# Patient Record
Sex: Female | Born: 1935 | Race: Black or African American | Hispanic: No | Smoking: Former smoker
Health system: Southern US, Community
[De-identification: ages and names within clinical notes are randomized; demographics above are authoritative.]

## PROBLEM LIST (undated history)

## (undated) DIAGNOSIS — M1990 Unspecified osteoarthritis, unspecified site: Secondary | ICD-10-CM

## (undated) DIAGNOSIS — Z8673 Personal history of transient ischemic attack (TIA), and cerebral infarction without residual deficits: Secondary | ICD-10-CM

## (undated) DIAGNOSIS — R413 Other amnesia: Secondary | ICD-10-CM

## (undated) DIAGNOSIS — I499 Cardiac arrhythmia, unspecified: Secondary | ICD-10-CM

## (undated) DIAGNOSIS — R9439 Abnormal result of other cardiovascular function study: Secondary | ICD-10-CM

## (undated) DIAGNOSIS — E039 Hypothyroidism, unspecified: Secondary | ICD-10-CM

## (undated) DIAGNOSIS — I739 Peripheral vascular disease, unspecified: Secondary | ICD-10-CM

## (undated) DIAGNOSIS — I2510 Atherosclerotic heart disease of native coronary artery without angina pectoris: Secondary | ICD-10-CM

## (undated) DIAGNOSIS — N184 Chronic kidney disease, stage 4 (severe): Secondary | ICD-10-CM

## (undated) DIAGNOSIS — IMO0002 Reserved for concepts with insufficient information to code with codable children: Secondary | ICD-10-CM

## (undated) DIAGNOSIS — Z9289 Personal history of other medical treatment: Secondary | ICD-10-CM

## (undated) DIAGNOSIS — I10 Essential (primary) hypertension: Secondary | ICD-10-CM

## (undated) DIAGNOSIS — B029 Zoster without complications: Secondary | ICD-10-CM

## (undated) DIAGNOSIS — G459 Transient cerebral ischemic attack, unspecified: Secondary | ICD-10-CM

## (undated) DIAGNOSIS — I428 Other cardiomyopathies: Secondary | ICD-10-CM

## (undated) DIAGNOSIS — E785 Hyperlipidemia, unspecified: Secondary | ICD-10-CM

## (undated) DIAGNOSIS — I5032 Chronic diastolic (congestive) heart failure: Secondary | ICD-10-CM

## (undated) DIAGNOSIS — I639 Cerebral infarction, unspecified: Secondary | ICD-10-CM

## (undated) DIAGNOSIS — G629 Polyneuropathy, unspecified: Secondary | ICD-10-CM

## (undated) DIAGNOSIS — I219 Acute myocardial infarction, unspecified: Secondary | ICD-10-CM

## (undated) DIAGNOSIS — K219 Gastro-esophageal reflux disease without esophagitis: Secondary | ICD-10-CM

## (undated) DIAGNOSIS — D649 Anemia, unspecified: Secondary | ICD-10-CM

## (undated) HISTORY — DX: Hyperlipidemia, unspecified: E78.5

## (undated) HISTORY — PX: EYE SURGERY: SHX253

## (undated) HISTORY — DX: Other cardiomyopathies: I42.8

## (undated) HISTORY — DX: Abnormal result of other cardiovascular function study: R94.39

## (undated) HISTORY — DX: Polyneuropathy, unspecified: G62.9

## (undated) HISTORY — PX: BACK SURGERY: SHX140

## (undated) HISTORY — PX: AV FISTULA PLACEMENT: SHX1204

## (undated) HISTORY — DX: Personal history of other medical treatment: Z92.89

---

## 1986-03-21 HISTORY — PX: ANGIOPLASTY: SHX39

## 1999-04-19 ENCOUNTER — Encounter: Admission: RE | Admit: 1999-04-19 | Discharge: 1999-05-31 | Payer: Self-pay | Admitting: Orthopedic Surgery

## 1999-10-11 ENCOUNTER — Ambulatory Visit (HOSPITAL_COMMUNITY): Admission: RE | Admit: 1999-10-11 | Discharge: 1999-10-11 | Payer: Self-pay | Admitting: *Deleted

## 2000-06-29 ENCOUNTER — Encounter: Admission: RE | Admit: 2000-06-29 | Discharge: 2000-06-29 | Payer: Self-pay | Admitting: *Deleted

## 2000-11-08 ENCOUNTER — Encounter (INDEPENDENT_AMBULATORY_CARE_PROVIDER_SITE_OTHER): Payer: Self-pay | Admitting: *Deleted

## 2000-11-08 ENCOUNTER — Ambulatory Visit (HOSPITAL_COMMUNITY): Admission: RE | Admit: 2000-11-08 | Discharge: 2000-11-08 | Payer: Self-pay | Admitting: *Deleted

## 2001-12-12 ENCOUNTER — Encounter: Admission: RE | Admit: 2001-12-12 | Discharge: 2001-12-12 | Payer: Self-pay | Admitting: Orthopedic Surgery

## 2001-12-12 ENCOUNTER — Encounter: Payer: Self-pay | Admitting: Orthopedic Surgery

## 2002-02-19 ENCOUNTER — Encounter: Payer: Self-pay | Admitting: Orthopedic Surgery

## 2002-02-22 ENCOUNTER — Inpatient Hospital Stay (HOSPITAL_COMMUNITY): Admission: RE | Admit: 2002-02-22 | Discharge: 2002-03-01 | Payer: Self-pay | Admitting: Orthopedic Surgery

## 2002-02-22 ENCOUNTER — Encounter: Payer: Self-pay | Admitting: Orthopedic Surgery

## 2002-07-16 ENCOUNTER — Other Ambulatory Visit: Admission: RE | Admit: 2002-07-16 | Discharge: 2002-07-16 | Payer: Self-pay | Admitting: Obstetrics and Gynecology

## 2002-07-24 ENCOUNTER — Ambulatory Visit (HOSPITAL_COMMUNITY): Admission: RE | Admit: 2002-07-24 | Discharge: 2002-07-24 | Payer: Self-pay | Admitting: Obstetrics and Gynecology

## 2002-07-24 ENCOUNTER — Encounter: Payer: Self-pay | Admitting: Obstetrics and Gynecology

## 2003-03-13 ENCOUNTER — Encounter: Admission: RE | Admit: 2003-03-13 | Discharge: 2003-03-13 | Payer: Self-pay | Admitting: Internal Medicine

## 2003-04-18 ENCOUNTER — Observation Stay (HOSPITAL_COMMUNITY): Admission: EM | Admit: 2003-04-18 | Discharge: 2003-04-19 | Payer: Self-pay | Admitting: Emergency Medicine

## 2004-07-05 ENCOUNTER — Observation Stay (HOSPITAL_COMMUNITY): Admission: EM | Admit: 2004-07-05 | Discharge: 2004-07-07 | Payer: Self-pay | Admitting: Emergency Medicine

## 2004-07-06 ENCOUNTER — Encounter (INDEPENDENT_AMBULATORY_CARE_PROVIDER_SITE_OTHER): Payer: Self-pay | Admitting: Cardiology

## 2005-03-08 ENCOUNTER — Other Ambulatory Visit: Admission: RE | Admit: 2005-03-08 | Discharge: 2005-03-08 | Payer: Self-pay | Admitting: Obstetrics and Gynecology

## 2005-04-01 ENCOUNTER — Ambulatory Visit (HOSPITAL_COMMUNITY): Admission: RE | Admit: 2005-04-01 | Discharge: 2005-04-01 | Payer: Self-pay | Admitting: Obstetrics and Gynecology

## 2006-01-13 ENCOUNTER — Emergency Department (HOSPITAL_COMMUNITY): Admission: EM | Admit: 2006-01-13 | Discharge: 2006-01-14 | Payer: Self-pay | Admitting: Emergency Medicine

## 2006-06-10 ENCOUNTER — Emergency Department (HOSPITAL_COMMUNITY): Admission: EM | Admit: 2006-06-10 | Discharge: 2006-06-10 | Payer: Self-pay | Admitting: Family Medicine

## 2006-12-10 ENCOUNTER — Emergency Department (HOSPITAL_COMMUNITY): Admission: EM | Admit: 2006-12-10 | Discharge: 2006-12-10 | Payer: Self-pay | Admitting: *Deleted

## 2006-12-24 ENCOUNTER — Encounter: Admission: RE | Admit: 2006-12-24 | Discharge: 2006-12-24 | Payer: Self-pay | Admitting: Internal Medicine

## 2007-09-23 ENCOUNTER — Inpatient Hospital Stay (HOSPITAL_COMMUNITY): Admission: EM | Admit: 2007-09-23 | Discharge: 2007-10-04 | Payer: Self-pay | Admitting: Emergency Medicine

## 2007-09-24 ENCOUNTER — Encounter (INDEPENDENT_AMBULATORY_CARE_PROVIDER_SITE_OTHER): Payer: Self-pay | Admitting: Internal Medicine

## 2007-09-28 HISTORY — PX: CARDIAC CATHETERIZATION: SHX172

## 2007-11-17 ENCOUNTER — Emergency Department (HOSPITAL_COMMUNITY): Admission: EM | Admit: 2007-11-17 | Discharge: 2007-11-17 | Payer: Self-pay | Admitting: Emergency Medicine

## 2008-02-11 ENCOUNTER — Emergency Department (HOSPITAL_COMMUNITY): Admission: EM | Admit: 2008-02-11 | Discharge: 2008-02-11 | Payer: Self-pay | Admitting: Emergency Medicine

## 2008-09-10 ENCOUNTER — Emergency Department (HOSPITAL_COMMUNITY): Admission: EM | Admit: 2008-09-10 | Discharge: 2008-09-11 | Payer: Self-pay | Admitting: Emergency Medicine

## 2008-09-25 ENCOUNTER — Emergency Department (HOSPITAL_COMMUNITY): Admission: EM | Admit: 2008-09-25 | Discharge: 2008-09-25 | Payer: Self-pay | Admitting: Emergency Medicine

## 2008-10-13 ENCOUNTER — Emergency Department (HOSPITAL_COMMUNITY): Admission: EM | Admit: 2008-10-13 | Discharge: 2008-10-14 | Payer: Self-pay | Admitting: Emergency Medicine

## 2009-01-22 ENCOUNTER — Emergency Department (HOSPITAL_COMMUNITY): Admission: EM | Admit: 2009-01-22 | Discharge: 2009-01-22 | Payer: Self-pay | Admitting: Emergency Medicine

## 2009-02-02 ENCOUNTER — Emergency Department (HOSPITAL_COMMUNITY): Admission: EM | Admit: 2009-02-02 | Discharge: 2009-02-02 | Payer: Self-pay | Admitting: Emergency Medicine

## 2009-04-29 ENCOUNTER — Emergency Department (HOSPITAL_COMMUNITY): Admission: EM | Admit: 2009-04-29 | Discharge: 2009-04-29 | Payer: Self-pay | Admitting: Emergency Medicine

## 2009-06-01 DIAGNOSIS — R9439 Abnormal result of other cardiovascular function study: Secondary | ICD-10-CM

## 2009-06-01 HISTORY — DX: Abnormal result of other cardiovascular function study: R94.39

## 2009-08-27 ENCOUNTER — Ambulatory Visit (HOSPITAL_COMMUNITY): Admission: RE | Admit: 2009-08-27 | Discharge: 2009-08-27 | Payer: Self-pay | Admitting: Orthopaedic Surgery

## 2009-12-31 ENCOUNTER — Emergency Department (HOSPITAL_COMMUNITY): Admission: EM | Admit: 2009-12-31 | Discharge: 2010-01-01 | Payer: Self-pay | Admitting: Emergency Medicine

## 2010-06-02 LAB — URINALYSIS, ROUTINE W REFLEX MICROSCOPIC
Hgb urine dipstick: NEGATIVE
Ketones, ur: NEGATIVE mg/dL
Protein, ur: 30 mg/dL — AB
Specific Gravity, Urine: 1.017 (ref 1.005–1.030)

## 2010-06-02 LAB — URINE MICROSCOPIC-ADD ON

## 2010-06-07 LAB — GLUCOSE, CAPILLARY
Glucose-Capillary: 123 mg/dL — ABNORMAL HIGH (ref 70–99)
Glucose-Capillary: 95 mg/dL (ref 70–99)

## 2010-06-07 LAB — BASIC METABOLIC PANEL
BUN: 22 mg/dL (ref 6–23)
GFR calc Af Amer: 34 mL/min — ABNORMAL LOW (ref >60)
GFR calc non Af Amer: 28 mL/min — ABNORMAL LOW (ref >60)
Glucose, Bld: 89 mg/dL (ref 70–99)
Potassium: 5.8 mEq/L — ABNORMAL HIGH (ref 3.5–5.1)
Sodium: 138 mEq/L (ref 135–145)

## 2010-06-07 LAB — POCT I-STAT 4, (NA,K, GLUC, HGB,HCT)
Glucose, Bld: 100 mg/dL — ABNORMAL HIGH (ref 70–99)
HCT: 39 % (ref 36.0–46.0)
Hemoglobin: 12.6 g/dL (ref 12.0–15.0)
Potassium: 5.7 mEq/L — ABNORMAL HIGH (ref 3.5–5.1)
Potassium: 6.3 mEq/L (ref 3.5–5.1)
Sodium: 140 mEq/L (ref 135–145)

## 2010-06-07 LAB — CBC
HCT: 37.2 % (ref 36.0–46.0)
Hemoglobin: 12.2 g/dL (ref 12.0–15.0)
MCHC: 32.9 g/dL (ref 30.0–36.0)
MCV: 96.2 fL (ref 78.0–100.0)
Platelets: 359 10*3/uL (ref 150–400)
WBC: 7.4 10*3/uL (ref 4.0–10.5)

## 2010-06-09 LAB — DIFFERENTIAL
Basophils Absolute: 0 10*3/uL (ref 0.0–0.1)
Basophils Relative: 1 % (ref 0–1)
Eosinophils Absolute: 0.2 10*3/uL (ref 0.0–0.7)
Eosinophils Relative: 5 % (ref 0–5)
Monocytes Absolute: 0.5 10*3/uL (ref 0.1–1.0)

## 2010-06-09 LAB — CBC
MCHC: 33.5 g/dL (ref 30.0–36.0)
Platelets: 256 10*3/uL (ref 150–400)
RDW: 14.5 % (ref 11.5–15.5)

## 2010-06-09 LAB — CK TOTAL AND CKMB (NOT AT ARMC)
Relative Index: INVALID (ref 0.0–2.5)
Total CK: 72 U/L (ref 7–177)

## 2010-06-09 LAB — BASIC METABOLIC PANEL
CO2: 23 mEq/L (ref 19–32)
Calcium: 8.5 mg/dL (ref 8.4–10.5)
Creatinine, Ser: 1.45 mg/dL — ABNORMAL HIGH (ref 0.4–1.2)
Glucose, Bld: 97 mg/dL (ref 70–99)

## 2010-06-27 LAB — CBC
Hemoglobin: 11.8 g/dL — ABNORMAL LOW (ref 12.0–15.0)
MCHC: 33 g/dL (ref 30.0–36.0)
RDW: 17.1 % — ABNORMAL HIGH (ref 11.5–15.5)

## 2010-06-27 LAB — COMPREHENSIVE METABOLIC PANEL
ALT: 30 U/L (ref 0–35)
Calcium: 9.6 mg/dL (ref 8.4–10.5)
GFR calc Af Amer: 30 mL/min — ABNORMAL LOW (ref >60)
Glucose, Bld: 148 mg/dL — ABNORMAL HIGH (ref 70–99)
Sodium: 137 mEq/L (ref 135–145)
Total Protein: 6.4 g/dL (ref 6.0–8.3)

## 2010-06-27 LAB — DIFFERENTIAL
Eosinophils Absolute: 0 10*3/uL (ref 0.0–0.7)
Lymphs Abs: 0.9 10*3/uL (ref 0.7–4.0)
Monocytes Relative: 7 % (ref 3–12)
Neutro Abs: 5.1 10*3/uL (ref 1.7–7.7)
Neutrophils Relative %: 78 % — ABNORMAL HIGH (ref 43–77)

## 2010-06-28 LAB — COMPREHENSIVE METABOLIC PANEL
BUN: 12 mg/dL (ref 6–23)
CO2: 23 mEq/L (ref 19–32)
Chloride: 103 mEq/L (ref 96–112)
Creatinine, Ser: 1.72 mg/dL — ABNORMAL HIGH (ref 0.4–1.2)
GFR calc non Af Amer: 28 mL/min — ABNORMAL LOW (ref >60)
Glucose, Bld: 154 mg/dL — ABNORMAL HIGH (ref 70–99)
Total Bilirubin: 0.6 mg/dL (ref 0.3–1.2)

## 2010-06-28 LAB — CBC
HCT: 35.1 % — ABNORMAL LOW (ref 36.0–46.0)
Hemoglobin: 11.7 g/dL — ABNORMAL LOW (ref 12.0–15.0)
MCV: 101.7 fL — ABNORMAL HIGH (ref 78.0–100.0)
RBC: 3.46 MIL/uL — ABNORMAL LOW (ref 3.87–5.11)
WBC: 7.4 10*3/uL (ref 4.0–10.5)

## 2010-06-28 LAB — DIFFERENTIAL
Basophils Absolute: 0 10*3/uL (ref 0.0–0.1)
Basophils Relative: 0 % (ref 0–1)
Lymphocytes Relative: 25 % (ref 12–46)
Neutro Abs: 4.9 10*3/uL (ref 1.7–7.7)

## 2010-06-28 LAB — URINALYSIS, ROUTINE W REFLEX MICROSCOPIC
Bilirubin Urine: NEGATIVE
Glucose, UA: NEGATIVE mg/dL
Hgb urine dipstick: NEGATIVE
Protein, ur: NEGATIVE mg/dL
Specific Gravity, Urine: 1.02 (ref 1.005–1.030)
Urobilinogen, UA: 0.2 mg/dL (ref 0.0–1.0)

## 2010-06-28 LAB — POCT CARDIAC MARKERS
CKMB, poc: 3.4 ng/mL (ref 1.0–8.0)
Myoglobin, poc: 55.2 ng/mL (ref 12–200)

## 2010-06-28 LAB — LIPASE, BLOOD: Lipase: 25 U/L (ref 11–59)

## 2010-08-03 NOTE — Consult Note (Signed)
[name], [name]               ACCOUNT NO.:  [id number]   MEDICAL RECORD NO.:  [id number]          PATIENT TYPE:  INP   LOCATION:  4713                         FACILITY:  [facility]   PHYSICIAN:  Dyke Maes, M.D.DATE OF BIRTH:  April 07, 1923   DATE OF CONSULTATION:  DATE OF DISCHARGE:                                 CONSULTATION   REFERRING PHYSICIAN:  Herbie Saxon, MD   REASON FOR CONSULTATION:  Chronic kidney disease.   HISTORY OF PRESENT ILLNESS:  This is an 77 year old white female  admitted on September 23, 2007, with a several-week history of weakness, poor  appetite, dyspnea on exertion, and chest pains.  She denies any history  of chronic kidney disease.  Her serum creatinine in 2006 was 1.5 and on  September 18, 2007, it was 1.68.  On admission, serum creatinine was 1.7 and  remained that way up until September 28, 2007, when she underwent heart  catheterization.  Serum creatinine is only slightly increased since then  to 1.8 on September 29, 2007, and 1.9 on September 30, 2007, and stable at 1.9  today.  She had a renal ultrasound done in 2004, which revealed a small  left kidney and renal ultrasound done earlier today confirms a small  left kidney with a right kidney of 9.6 cm that is echogenic.  She has  had hypertension for as long as I can remember but at least 40 years.  She is found to be diabetic this hospitalization.  According to her, she  has never been told that she has been diabetic.  She is to undergo a  heart cath on September 28, 2007, with 45 milliseconds contrast, which  revealed only mild coronary artery disease, not requiring any other  procedures.  She denies any history of gross hematuria, kidney stones,  use of nonsteroidal medications, or family history of renal disease.   PAST MEDICAL HISTORY:  Significant for:  1. CVA.  2. Gastroesophageal reflux disease.  3. Hypertension.  4. Hypothyroidism.  5. History of an MI.  She is status post a cath in 1980.  6.  History of gout.  7. Chronic low back pain, for which she says she was placed on small      dose of prednisone.   ALLERGIES:  No true allergies, though ASPIRIN causes upset stomach.   MEDICATIONS:  1. Lovenox 30 mg daily.  2. Prednisone 5 mg b.i.d.  3. Calcium carbonate 1 a day.  4. Crestor 20 mg a day.  5. Quinine 325 mg nightly.  6. Aggrenox 1 b.i.d.  7. Protonix 40 mg a day.  8. Lopressor 25 mg a day.  9. Norvasc 10 mg daily.  10.Levothyroxine 25 mcg a day.  11.Lantus 4 units nightly.  12.Rocephin 1 g daily.   SOCIAL HISTORY:  Ex-smoker, quit 30 years ago.  She denies alcohol.  She  lives in [place] with her son.   FAMILY HISTORY:  Negative for renal disease.  She has a brother and a  sister with heart disease.   REVIEW OF SYSTEMS:  Appetite has improved somewhat.  She  denies any  shortness of breath at present.  No chest pains or chest pressures.  No  recent change in bowel habits.  She does have chronic low back pain.  No  new arthritic complaints.  No new skin rashes.  No dysuria.  The rest of  review of systems are unremarkable.   PHYSICAL EXAMINATION:  VITAL SIGNS:  Blood pressure 113/53, pulse 79,  and temperature 97.9.  GENERAL:  An 77 year old black female in no acute distress.  HEENT:  Sclerae nonicteric.  Extraocular muscles are intact.  NECK:  No JVD.  No lymphadenopathy.  LUNGS:  Decreased breath sounds in bases with a few bibasilar crackles.  HEART:  Regular rate and rhythm without murmur, rub, or gallop.  ABDOMEN:  Positive bowel sounds.  Nontender and nondistended.  No  hepatosplenomegaly.  No bruits.  EXTREMITIES:  No edema.  No evidence for cholesterol emboli.  Pulses are  2/4 in the carotids, radialis, and femoralis and 0/4 in posterior tibial  and dorsalis pedis.  NEUROLOGIC:  Cranial nerves are intact.  Motor and sensory intact.  Oriented x3.  No asterixis.   LABORATORY DATA:  Sodium 133, potassium 4.6, bicarb 18, BUN 33, and  creatinine 1.9.   Hemoglobin 8.4, white count 8.8, and platelet count  333,000.  Urinalysis reveals a benign microscopic exam 30 mg/dL protein.   IMPRESSION:  1. Chronic kidney disease stage III secondary to solitary functioning      right kidney and hypertension.  2. Hypertension, currently well controlled.  3. Coronary artery disease, medical management.  4. Anemia.  Of note, her hemoglobin was 10.8 on August 29, 2007, and on      admission was 10.5, was decreased to 8.8.   PLAN:  1. We will check an SPEP.  2. We will check a serum protein electrophoresis.  3. We will check iron, TIBC, and ferritin  4. We will intact PTH.  5. We will start p.o. Lasix 80 mg b.i.d.   Thank you for this consult.  We will follow the patient with you.           ______________________________  Dyke Maes, M.D.     MTM/MEDQ  D:  10/01/2007  T:  10/02/2007  Job:  [id number]

## 2010-08-03 NOTE — Discharge Summary (Signed)
[name], [name]               ACCOUNT NO.:  [id number]   MEDICAL RECORD NO.:  [id number]          PATIENT TYPE:  INP   LOCATION:  4713                         FACILITY:  [facility]   PHYSICIAN:  Beckey Rutter, MD  DATE OF BIRTH:  October 01, 1923   DATE OF ADMISSION:  09/23/2007  DATE OF DISCHARGE:                               DISCHARGE SUMMARY   CHIEF COMPLAINT ON PRESENTATION:  Generalized weakness, anorexia for 3  weeks and poor p.o. intake.  Also, chest discomfort.   HOSPITAL COURSE:  1. The patient has multiple reasons for her poor p.o. intake, but the      main concern was her chest pain, which was associated with      diaphoresis.  The patient was seen by cardiology for stratification      and her cardiac cath was not significant.  Please see the detailed      result of cardiac catheterization on the E-chart or the summary      below.  2. Chronic kidney disease.  As per her daughter, the patient never      diagnosed with chronic kidney disease, although her creatinine was      1.9 on admission and remained around that number.  She received      Mucomyst prior to the coronary angiogram contrast.  The nephrology      was called for evaluation of chronic kidney disease stage 3 and it      was felt the chronic kidney disease likely secondary to solitary      functioning right kidney and hypertension.  The rest of the kidney      workup is pending.   The patient was started on p.o. Lasix 80 mg b.i.d. but today she had  severe orthostatic symptoms and I would go ahead and discontinue the  Lasix and will discontinue Norvasc as well.   DISCHARGE DIAGNOSES:  1. Generalized weakness with multiple morbidity and now with chronic      kidney disease stage 3.  2. Chest pain.  The patient ruled out for acute coronary syndrome and      now she is status post coronary angiogram.  3. History of cerebrovascular accident.  4. Hypertension.  5. Hypothyroidism.  6. History of gout.   HOSPITAL CONSULTATION:  1. [facility].  2. Nephrology consultation done by Dr. Primitivo Gauze.   DISCHARGE MEDICATIONS:  Discharge medication will be dictated on the day  of actual discharge.   PLAN:  The plan to stabilize her blood pressure and finalize the kidney  workup prior to discharge.      Beckey Rutter, MD  Electronically Signed    EME/MEDQ  D:  10/02/2007  T:  10/03/2007  Job:  [id number]

## 2010-08-03 NOTE — H&P (Signed)
NAMEHEER, JUSTISS               ACCOUNT NO.:  [id number]   MEDICAL RECORD NO.:  [id number]          PATIENT TYPE:  EMS   LOCATION:  MAJO                         FACILITY:  [facility]   PHYSICIAN:  Herbie Saxon, MDDATE OF BIRTH:  Jun 08, 1923   DATE OF ADMISSION:  09/23/2007  DATE OF DISCHARGE:                              HISTORY & PHYSICAL   PRESENTING COMPLAINT:  Generalized weakness, anorexia 3 weeks, poor oral  intake x3 weeks, difficult to ambulate, chest discomfort, and shortness  of breath on exertion, one day.   HISTORY OF PRESENTING COMPLAINT:  This 77 year old African-American lady  presented to the emergency room with generalized chest discomfort,  dyspnea on exertion, increased weakness, making her unable to actually  ambulate.  The patient has been sick for the last 3 weeks with  intermittent nausea, vomiting, anorexia, and malaise.  She denies any  fever, skin rash, or lymph node swelling.  The patient has not been  coughing.  She denies any dysuria, hematuria, urgency, or frequency of  urination.  She does not have any ankle swollen.  No facial puffiness.  There was no documented weakness, no syncopal episode, loss of  consciousness, or seizure activity.  There has been no joint swelling.   PAST MEDICAL HISTORY:  CVA, gastroesophageal reflux disease, gout,  hyperlipidemia, hypertension, hypothyroidism, myocardial infarction; she  is status post PTCA.   SURGERIES:  Cardiac catheterization, PTCA, performed by Dr. Shirlee Latch and  back surgery.   SOCIAL HISTORY:  She lives with the family and quit smoking more than 30  years ago.  No history of illicit drug abuse or alcohol abuse.   FAMILY HISTORY:  She has a brother with coronary artery disease; also  has a sister with heart disease.   ALLERGIES:  ASPIRIN.   MEDICATIONS:  1. Aggrenox 1 tablet twice daily.  2. Caduet 5/40 one daily.  3. Calcium 600 mg daily.  4. Fosamax Plus D 70 mg daily.  5. Levothyroxine 1  tablet daily.  6. Qualaquin 324 mg daily.  7. VESIcare p.r.n.  8. Labetalol twice daily.  9. Zantac 75 mg daily.  10.Prednisone 1 tablet twice daily.   REVIEW OF SYSTEMS:  14 systems are reviewed, pertinent positives as in  the history of presenting complaint.   PHYSICAL EXAMINATION:  GENERAL:  On examination, she is an elderly lady,  alert and oriented to time, place, and person.  VITAL SIGNS:  Temperature is 100.8, pulse is 105, respiratory rate is  28, and blood pressure 88/67.  HEENT:  She is pale, not jaundiced.  Head is atraumatic and  normocephalic.  Mucous membranes are dry.  NECK:  Supple.  There is no elevated JVD or thyromegaly.  No carotid  bruits.  HEART:  Sounds are 1 and 2, regular rate and rhythm.  ABDOMEN:  Soft and nontender.  No organomegaly.  CHEST:  Clinically clear.  No rales or rhonchi.  No gallops, rubs, or  murmurs.  NEUROLOGIC:  Cranial nerves II through XII are intact.  There is no  pronator drift.  Power is 4+ in all limbs, but she is  unable to ambulate  because of extreme weakness.  Peripheral pulses present.  No pedal  edema.  There is no erythema.  No cyanosis.  No clubbing.   LABORATORY DATA:  WBC is 11.1, hematocrit 32, and platelet count is 268.  Chemistry, sodium is 132, potassium 4.3, chloride 103, BUN 20,  creatinine 1.9, glucose is 169, troponin is less than 0.05, INR is 1.0,  and PTT 41.  Chest x-ray, no active disease.  CT head, no acute changes.  Other labs pending.  EKG pending.   ASSESSMENT:  Dehydration; failure to thrive; hyponatremia; diabetes,  uncontrolled; leukocytosis, rule out underlying focal infection with  incipient septic shock; anemia of chronic disease; chest pain rule out  acute coronary syndrome.  The patient will be admitted to telemetry bed.  Was started on IV fluid, D5 half normal saline at 40 mL an hour,  watching for fluid overload.  We will get a serial cardiac enzymes and  EKG, obtain a cortisol level, D-dimer,  and 2D echocardiogram.  We will  get a thyroid function, fasting lipid panel, hemoglobin A1c.  The  patient will be on Accu-cheks a.c. and h.s. with NovoLog sliding scale  coverage.  We will start her on Rocephin 1 g IV daily.  We will continue  with calcium supplementation.  We will hold her labetalol and Caduet  will be resumed but we will hold if blood pressure is less than 100/60.  PT/OT input will be sought.  She is to be on Lovenox 30 mg subcu daily  for DVT prophylaxis and Protonix 40 mg IV daily, DuoNeb 1 unit dose  q.6h. p.r.n.  We will repeat her labs CBC, CMP in the morning and obtain  the urine sodium level.  Her illness, medication, test, and treatment  plan explained to her family members, they verbalized understanding.      Herbie Saxon, MD  Electronically Signed     MIO/MEDQ  D:  09/23/2007  T:  09/24/2007  Job:  [id number]

## 2010-08-03 NOTE — Cardiovascular Report (Signed)
[name], [name]               ACCOUNT NO.:  [id number]   MEDICAL RECORD NO.:  [id number]          PATIENT TYPE:  INP   LOCATION:  4713                         FACILITY:  [facility]   PHYSICIAN:  Antonieta Iba, MD   DATE OF BIRTH:  30-Nov-1923   DATE OF PROCEDURE:  DATE OF DISCHARGE:                            CARDIAC CATHETERIZATION   PROCEDURE:  Cardiac catheterization.   PHYSICIAN PERFORMING PROCEDURE:  Antonieta Iba, MD   PROCEDURE DETAIL:  [name] was brought to the cardiac catheterization  and prepped and draped in usual sterile fashion.  1% lidocaine was used  for local anesthesia above the right femoral artery and the modified  Seldinger technique was used to advance a 6-French catheter into the  right femoral artery.  A 6-French Judkins  #4 left and a #4 right  catheter was used to engage the coronary arteries, left and right  coronary arteries respectively.  Selective angiography by hand injection  was performed with multiple projections of different views of the  coronary anatomy.  No LV gram was performed due to the patient's  elevated creatinine.  A total of 45 mL of contrast was used for the  catheterization.  There were no complications noted.  The  catheterization was removed following the procedure and hemostasis was  obtained with pressure.   PROCEDURE DETAIL:  1. Left main:  The left main is a moderate-sized vessel that      bifurcates into the LAD and left circumflex.  There is no      significant disease noted.  2. Left anterior descending:  The left anterior descending is a      moderate-to-large sized vessel that extents and wraps around the      apex. There are 3 diagonal vessels of moderate size.  It is      difficult to visualize the ostial D1 that appears to be some      moderate stenosis with an estimated lesion of 50-60%.  There is      some mild mid LAD disease between the first diagonal and second      diagonal.  Otherwise, there is no other  significant disease noted.  3. Left circumflex:  The left circumflex is a moderate-sized vessel      with 3 obtuse marginal branches.  The OM1 and OM2 are moderate-      sized vessels.  There is some mild proximal left circumflex disease      before the OM1.  Otherwise, no significant disease noted.  4. Right coronary artery:  The RCA is a dominant vessel that extents      to a PDA and PL branch.  It is large branch with mild diffuse      regions of disease estimated from 20-40%.  These lesions extent      proximally to mid-to-distally and into the PDA branch.  No      significant stenosis is seen.   FINAL IMPRESSION:  Right dominant coronary system with mild-to-moderate  coronary disease in the left anterior descending, circumflex, and right  coronary artery.  There  is no significant stenosis that warrants  intervention at this time.  The patient's creatinine is mildly elevated  and she will be given fluids following this procedure and her creatinine  will be monitored.  For her coronary disease, medical management is  strongly recommended.      Antonieta Iba, MD  Electronically Signed    TJG/MEDQ  D:  09/28/2007  T:  09/29/2007  Job:  [id number]

## 2010-08-03 NOTE — Consult Note (Signed)
[name], [name]               ACCOUNT NO.:  [id number]   MEDICAL RECORD NO.:  [id number]          PATIENT TYPE:  EMS   LOCATION:  MAJO                         FACILITY:  [facility]   PHYSICIAN:  Vinnie Level, MD    DATE OF BIRTH:  06/12/1923   DATE OF CONSULTATION:  09/11/2008  DATE OF DISCHARGE:  09/11/2008                                 CONSULTATION   TIME:  4 a.m.   REFERRING PHYSICIAN:  [facility].   CHIEF COMPLAINT:  Abdominal pain.   HISTORY OF PRESENT ILLNESS:  An 77 year old African American female with  history of hypertension, diabetes, gout, AFib, and remote stroke,  presents with 2 days of diffuse abdominal pain and anorexia.  She noted  the pain came on gradually and has had decreased p.o. intake over the  interim.  She complains of one episode of emesis the night before last  and one episode tonight with administration of oral CT contrast.  She  noted the pain is mild now and generally diffuse.  She has been able to  take her meds at home.   PAST MEDICAL HISTORY:  Notable for:  1. Atrial fibrillation.  2. CVA in 1988.  3. GERD.  4. Hypertension.  5. Diabetes.  6. Gout.  7. Hyperlipidemia.  8. Hypothyroidism.  9. Coronary artery disease.  10.Osteoarthritis.  11.CKD with baseline creatinine about 1.7.  12.Polymyalgia rheumatica.  13.History of remote back surgery.   MEDICATIONS AT HOME:  1. Prednisone 5 mg p.o. daily.  2. Aggrenox 25/200 p.o. daily.  3. Levothyroxine 100 mg p.o. daily.  4. Fosamax 70 mg every week.  5. Metformin 500 mg p.o. daily.  6. Caduet 5/40 p.o. daily.  7. Labetalol 100 mg p.o. daily.  8. Quinalan 324 mg daily.  9. Tramadol p.r.n.  10.Flonase p.r.n.  11.Combivent p.r.n.  12.Vicodin p.r.n.   ALLERGIES:  ASPIRIN, which she reports causes emesis.  However, she is  on Aggrenox.   FAMILY HISTORY:  Noncontributory.  No history of early coronary artery  disease.   REVIEW OF SYSTEMS:  Full 14-point review of systems was  obtained,  negative except as noted above in the HPI.   OBJECTIVE:  VITAL SIGNS:  Temperature is 98.1, blood pressure 147/79,  pulse 73-87, respiratory rate 18, sats 93% to 100% on room air.  GENERAL:  In no acute distress.  Alert and oriented x4 to time, place,  date, and President.  HEENT:  Normocephalic and atraumatic.  Pupils are equal, round, reactive  to light and accommodation.  Hard of hearing.  NECK:  No JVD.  No bruits.  CARDIOVASCULAR:  A 2/6 systolic murmur at the upper sternal border  without radiation to the neck.  S1 and S2 are normal.  CHEST:  Clear to auscultation bilaterally.  ABDOMEN:  Normal bowel sounds.  Positive gas.  She does have some mild  diffuse tenderness to palpation but no focal areas of tenderness.  No  rebound.  No guarding.  EXTREMITIES:  No clubbing, cyanosis, or edema.  Decreased bulk  peripherally.  NEUROLOGIC:  Nonfocal.  No residual weakness.  Cranial nerves II through  XII are grossly intact.  SKIN:  Without rash.  PSYCH:  Mood and affect are fair.   OBJECTIVE DATA:  CBC with white count of 7.4, hemoglobin 11.7, platelet  count 301.  She has normal urinalysis.  Her lipase was 25.  CMP revealed  sodium of 134, potassium of 4.2, chloride 103, bicarb 23, BUN 12,  creatinine at baseline 1.72, glucose 154, calcium 7.1, albumin 3.8.  Her  alk phos was normal at 50, and AST and ALT were completely normal.  CT  of the abdomen without IV contrast due to her CKD did note some duodenal  diverticula and left renal atrophy.  No evidence of ductal dilatation.   ASSESSMENT AND PLAN:  An 77 year old African-American female with  hypertension, diabetes, atrial fibrillation, hyperlipidemia, and  polymyalgia rheumatica who presents with diffuse nonfocal abdominal pain  without localizing signs by exam and history of recent normal labs.  She  does have a normal CT scan as well.  Her differential includes  diverticulosis versus diverticulitis though she has no  history of  bleeding, gastritis with possible offensive medication including  metformin and recently increased dose of her prednisone (Thursday of the  week prior).  There is no evidence of biliary obstruction, and her  pancreatic enzymes are within normal limits.  1. She is going to try an oral challenge here in the emergency room.      If she is able to hold down water and starts to feel better, I      think she is appropriate for discharge.  She will need short-term      followup with her primary care physician within the next several      days.  She is encouraged to continue oral hydration and encouraged      to avoid significant solid foods and slowly advance her diet.  Her      daughters understood this.  I would recommend cessation of      metformin for the time being and discussed with her primary care      physician plans for her prednisone.  I strongly encouraged them to      plan to taper this to off as soon as possible and possibly to      follow      up with a rheumatologist for other options other than chronic      prednisone administration.  2. Her gallbladder appeared normal by CT scan; however, an abdominal      ultrasound would not be unreasonable in the outpatient setting.      Vinnie Level, MD  Electronically Signed     PMB/MEDQ  D:  09/11/2008  T:  09/11/2008  Job:  [id number]

## 2010-08-06 NOTE — Discharge Summary (Signed)
[name], [name]               ACCOUNT NO.:  [id number]   MEDICAL RECORD NO.:  [id number]          PATIENT TYPE:  INP   LOCATION:  4713                         FACILITY:  [facility]   PHYSICIAN:  Michelene Gardener, MD    DATE OF BIRTH:  04/13/1923   DATE OF ADMISSION:  09/23/2007  DATE OF DISCHARGE:  10/04/2007                               DISCHARGE SUMMARY   ADDENDUM   DISCHARGE DIAGNOSES:  1. Generalized weakness with multiple morbidity, the patient is back      to her baseline.  2. Orthostatic hypotension.  3. Chest pain, this is noncardiac.  4. Chronic kidney disease.  5. History of cerebrovascular accident.  6. Hypertension.  7. Hypothyroidism.  8. Arthritis.   DISCHARGE MEDICATIONS:  1. Aggrenox 1 tab twice daily.  2. Calcium 600 mg once a day.  3. Fosamax 70 mg once a week.  4. Synthroid 50 mg once a day.  5. Prednisone 5 mg twice daily.  6. Qualaquin 325 mg once a day.   CONSULTATIONS:  No new consult was done since last dictation.   PROCEDURES:  None since last dictation.   RADIOLOGY STUDIES:  None since last dictation.   Followup with primary doctor in 1-2 weeks.   COURSE OF HOSPITALIZATION:  For detailed course of hospitalization up to  October 02, 2007, please see prior discharge summary.  As mentioned before,  this patient has chronic kidney disease.  She was evaluated by Dr.  Primitivo Gauze.  He started her on Lasix at 80 mg twice daily.  After  she received her Lasix.  The patient and became hypotensive and her  vitals were showing orthostatic hypotension.  Lasix and other  antihypertensive medications were discontinued and blood pressure has  been followed daily.  Initially, she was orthostatic.  Last vitals were  done on her on the night prior to her discharge and this showed no  orthostatic hypotension.  On the day of discharge, the patient refused  orthostatics, but she had her blood pressure checked while she was laid  down and it was 137/84.  The  patient stood up and there was no  dizziness.  Prior to that, she had dizziness with standing up.  Her  family were present at the bedside and I had conference with them.  We  discussed her long-term condition.  I instructed them to stop all her  antihypertensive medications for now because of her orthostatic  hypotension, and I explained to them that her blood pressure has been  stable without those medications.  I also told then her blood pressure  might start to increase out of those medications, so it will be very  important to follow with the primary doctor within a few days.  The  patient also refused labs in the last day of admission and stated that  she will not allow any more labs in the hospital.  Again, her family  agreed and said she already went through a lot and they do not want any  more lab work at this point.  I explained to them the importance  of  following up with primary doctor and to repeat her labs to follow her  kidney.  The last labs were done on October 03, 2007, and showed  BUN/creatinine of 47/2.85.  That had mildly worsened from the lab from  the one before which was 46/2.69.  She had random cortisol done and it  was mildly above normal and I spoke to Endocrinology over the phone,  they did not think it is something to any about.  So a parathyroid  hormone was done and that came to be normal.  Last hemoglobin was stable  at 9.2.   DISPOSITION:  Overall in the time of discharge, the patient is  asymptomatic.  She did not have dizziness, and her blood pressure has  been stable.  She was instructed to follow with her primary doctor in a  few days to check her blood pressure and to reassess to start her blood  pressure medicine and to check her BUN/creatinine.  She was also advised  to come to the ER if her symptoms worsened or if she developed any new  problems.  Again, family was instructed to keep the pt for more 1 or 2  more days, but they insisted of taking her  home, so home arrangements  were done for home health and home health and home PT   Total assessment time is 1 hour 40 minutes.       Michelene Gardener, MD  Electronically Signed     NAE/MEDQ  D:  10/22/2007  T:  10/23/2007  Job:  [id number]

## 2010-08-06 NOTE — Consult Note (Signed)
[name], [name]               ACCOUNT NO.:  [id number]   MEDICAL RECORD NO.:  [id number]          PATIENT TYPE:  EMS   LOCATION:  MAJO                         FACILITY:  [facility]   PHYSICIAN:  Melvyn Novas, M.D.  DATE OF BIRTH:  Aug 12, 1923   DATE OF CONSULTATION:  07/05/2004  DATE OF DISCHARGE:                                   CONSULTATION   The patient was presenting to the [facility] Emergency Room on July 05, 2004 at 10:30 with complaints of a transient left-sided facial droop and  slurring of speech that her son had observed.  The patient, herself, states  that she was not aware of any deficits.  Her son had also reported,  according to the EMS notes in her chart, that she did not walk straight.  [name] is a right-handed African-American widowed female with history of  hypertension, coronary artery disease, and presents today with a very high  blood pressure systolic over 200 which was treated by Dr. Ethelda Chick at the  time we evaluated the patient.  Once her blood pressures resumed systolic  levels below 180, her symptoms all resolved and the patient appears to be  alert, oriented, has no sign of dysphagia or dysarthria at this point.  Daughter is present at the bedside and states that her mom looks at  baseline.   The patient is alert and oriented x3, pleasant, cooperative, follows motor  commands, can speak fluently and clearly.  She states she took her  antihypertensive medicines which are listed below and a baby aspirin last  night, but had not taken any of her medications this morning.  She denies  any acute distress, memory, vision, or speech deficits; and again, her  daughter confirms that she seems to be at her baseline.   A brain CT was already obtained and was negative.  A CMET showed no  metabolic abnormalities except for an elevated creatinine at 1.6 which  should be followed on.  The EKG showed an old septal infarct.  Echocardiogram is pending.   Cardiac enzymes are pending.  The patient is  status post catheterization in January 2005, apparent myocardial infarction  in 1999 for which she was seen by Dr. Lucas Mallow.  She also states that she has  low thyroid function and osteoporosis.   MEDICATIONS:  1.  Hydrochlorothiazide.  2.  Synthroid.  3.  Propoxy.  4.  Caduet.  5.  Fosamax.  6.  According to the patient, an aspirin 81 mg enteric coated a day.   Interestingly, her EMS sheet states that she is allergic to aspirin.   SOCIAL HISTORY:  The patient quit smoking in 1988 and was until that time a  half pack per day smoker.  She does not drink alcohol.  Has no history of  any illegal drug abuse.  She is living with her son.  She is retired and  used to be a Programme researcher, broadcasting/film/video.  She states that her mother was healthy, but died in  child birth.  Her father died of unknown causes.  She has two sisters that  had  cancer.  She does no further specify what kind.  One brother was also  diagnosed with a malignancy.  The patient states she has no stent placed  since her coronary artery disease was diagnosed and had an uneventful  catheterization last year.  She had back surgery in 2003.  She never had any  surgery to the head or brain, or peripheral nerves.   CONCLUSION:  Transient ischemic attack, anemia, resolved with blood pressure  control.  The patient will be admitted to her primary care physician, Dr.  Nicholos Johns.  Is to day covered by the [facility] hospitalist, Dr. Garnette Czech.      CD/MEDQ  D:  07/05/2004  T:  07/05/2004  Job:  [id number]   cc:   Georgianne Fick, M.D.  [address]  [place]  [place] [address]  Fax: [phone]   Jaclyn Prime. Lucas Mallow, M.D.  [address]  [place]  [place] [address]  Fax: [phone]

## 2010-08-06 NOTE — H&P (Signed)
[name], [name]               ACCOUNT NO.:  [id number]   MEDICAL RECORD NO.:  [id number]          PATIENT TYPE:  EMS   LOCATION:  MAJO                         FACILITY:  [facility]   PHYSICIAN:  Hettie Holstein, D.O.    DATE OF BIRTH:  09-11-1923   DATE OF ADMISSION:  07/05/2004  DATE OF DISCHARGE:                                HISTORY & PHYSICAL   CHIEF COMPLAINT:  Slurred speech.   HISTORY OF PRESENT ILLNESS:  This is a pleasant African-American female who  lives at home with her son who around 8:00 today had experienced some  slurred speech as described by her son in addition to facial droop.  She was  directed to the emergency department which stroke service was initiated as  she did have intermittent symptoms according to the ED staff.  She was  evaluated by Dr. Porfirio Mylar Dohmeier.  CT scan of the brain revealed left  thalamic lacunar infarct.  She had no focal neurologic deficits on  examination by Dr. Vickey Huger or myself.   PAST MEDICAL HISTORY:  1.  History of coronary artery disease status post MI in the past.  She has      undergone cardiac catheterization by Dr. Jenne Campus that revealed a      depressed ejection fraction of 35-40% on ventriculogram.  She underwent      angiogram at that time as well with left renal artery atherosclerotic      disease.  This concluded that she had two vessel coronary artery      disease.  She underwent PTCA and states that she has been doing well      since that period of time.  2.  Hypothyroidism.  3.  She has a history of back surgery approximately two to three years ago.      She is not able to provide the details.  4.  She has had history of smoking in the distant past.  5.  No prior appendectomy, cholecystectomy, or hysterectomy.   MEDICATIONS:  1.  Hydrochlorothiazide 25 mg daily.  2.  Propoxyphene 1-2 mg p.o. b.i.d.  3.  Synthroid 50 mcg daily.  4.  Caduet 5/40 mg daily.  5.  Aspirin 81 mg daily.  6.  Fosamax 70 mg weekly.   ALLERGIES:  She has no known drug allergies.  She has had some GI  intolerances to high doses of ASPIRIN.   SOCIAL HISTORY:  She quit smoking tobacco in 1988.  She lives at home with  her son.  She denies alcohol.   FAMILY HISTORY:  She is not aware of her fathers history.  She has three  living children.  Mother died age 38 in childbirth.   REVIEW OF SYSTEMS:  She has no nausea, vomiting, diarrhea, chest pain,  shortness of breath.  No blood in her stools.  Otherwise, further review of  systems is unremarkable.   PHYSICAL EXAMINATION:  VITAL SIGNS:  Blood pressure in the emergency  department was initially elevated.  Repeat assessment revealed a systolic of  180, diastolic of 105, heart rate of 82, O2 saturation  100%, temperature 98.  GENERAL:  She is alert, awake, oriented, no acute distress.  Bit slow to  respond to questioning; however, I am not certain of her baseline.  HEENT:  Head is normocephalic, atraumatic.  Extraocular movements are  intact.  NECK:  Supple, nontender.  No palpable thyromegaly or mass.  CARDIOVASCULAR:  Normal S1, S2.  LUNGS:  Clear to auscultation bilaterally.  There is normal effort.  No  evidence of dullness to percussion.  ABDOMEN:  Soft, nontender.  No palpable mass or hepatosplenomegaly.  EXTREMITIES:  Lower extremities reveal no edema.  NEUROLOGIC:  No focal neurologic examination on strength testing.  Cranial  nerves II-XII are grossly intact.   LABORATORY DATA:  Only an I-stat is available for review in the emergency  department and this revealed a creatinine of 1.6.  Review of her previous  laboratories over hospitalization several years ago she did have a  creatinine of 1.5.   ASSESSMENT:  1.  Transient ischemic attack with CT evidence of age indeterminate lacunar      infarct in the thalamus on the left side.  2.  Ischemic cardiomyopathy.  3.  Hypertension.  4.  Hyperlipidemia.  5.  Hypothyroidism.  6.  Gastrointestinal intolerance to  high dose aspirin therapy.   PLAN:  At this point in time we are going to admit [name] to unit 3000,  follow on telemetry for arrhythmia, check TIA protocol as per Dr. Vickey Huger,  follow her course clinically, check an MRI/MRA of the brain and risk factor  modify as indicated.      ESS/MEDQ  D:  07/05/2004  T:  07/05/2004  Job:  [id number]   cc:   Georgianne Fick, M.D.  [address]  [place]  [place] [address]  Fax: [phone]

## 2010-08-06 NOTE — Op Note (Signed)
[name], [name]                           ACCOUNT NO.:  [id number]   MEDICAL RECORD NO.:  [id number]                   PATIENT TYPE:  INP   LOCATION:  0379                                 FACILITY:  [facility]   PHYSICIAN:  Marlowe Kays, M.D.               DATE OF BIRTH:  January 15, 1924   DATE OF PROCEDURE:  02/22/2002  DATE OF DISCHARGE:                                 OPERATIVE REPORT   SURGEON:  Marlowe Kays, M.D.   ASSISTANT:  Ronnell Guadalajara, M.D.   SECOND ASSISTANT:  Sharolyn Douglas, M.D.   PREOPERATIVE DIAGNOSES:  Spinal stenosis L3-4 and L4-5 with lateral disk  bulge at 3-4, left.   POSTOPERATIVE DIAGNOSES:  Spinal stenosis L3-4 and L4-5 with lateral disk  bulge at 3-4, left.   PROCEDURE:  Central decompressive laminectomy L3 to L5 with foraminotomies.   DESCRIPTION OF PROCEDURE:  After suitable general anesthesia, she was  positioned on the old laminectomy frame not the Andrews frame and prepped  and draped routinely. X-rays were brought in that confirmed needle placement  after which an incision is made and a second set of x-rays confirmed that  the Zannie Cove is on the fourth lumbar vertebra. A decompression is then done  beginning at the distal end of L4 all the way through the body of L4 through  the ligamentum flavum and up into L3 and then coming inferiorly down to the  body of L5. Dr. Simonne Come was working on the area on the left at 4-5 when a  dural tear resulted and it was quite adhered to the lateral recess area.  This was packed off. Shortly after that Dr. Simonne Come began to feel hot and  stepped back from the table, the nurses grabbed him, I went to the other  side of the table and helped to lay him down on the floor where he passed  out. I then went back and changed gloves and continued the surgery as Dr.  Simonne Come gradually recovered and was taken to the emergency room. I was  then assisted by one of the extra nurses who came in and the area of the  dural tear  was packed off and proceeded to do the remaining decompressive  laminectomy over on the right side working out the foramen at 3-4 and 4-5  and on the left side at 3-4. Dr. Sharolyn Douglas came in at the last as I worked  on 4-5 on the left using an osteotome to widen it so that we could get good  exposure around the dural tear. I used a small osteotome to take off more of  the facet down to the area of the tear. Dr. Noel Gerold came in and then we in  combination were able to finish up the last little bit and do a running  repair of about an inch long linear tear on the dorsum of the dura with a 5-  0 running Nurolon and there was a small incomplete abrasion of the dura just  adjacent to it but not a full thickness area. The Valsalva there was just a  little minimal ooze through the proximal part of the wound. The nerve roots  were followed out at the end of the procedure and felt to be free in the  foramen. I do not feel that the 3-4 disk on the left was a significant  enough problem to warrant removal. The decompression seemed to have taken  care of it adequately. A fibrin sealant 2 mm 2 component was taken out and  allowed to warm and then mix and appropriate time having passed well over an  hour was applied on top of the repair. Bleeding had been accomplished  throughout with the use of Gelfoam and bone wax. At the end a piece of  Gelfoam was put on top with the sealant and then some muscle stitches in the  muscle. #1 coated Vicryl as well as piling that in a neat fashion on top of  the muscle to get a nice tight closure, 2-0 in the subcu and staples in the  skin. The initial part of the surgery was done under direct vision. The  microscope had been brought in.  Dr. Simonne Come was using the microscope  cleaning out the  lateral gutter at 4-5 on the left when the dural tear occurred, so we  continued using the microscope through the rest of the case until we got the  wound closure. The patient went  to recovery in good condition with plans to  keep her down for 4 or 5 days. I will have a catheter put in in recovery.     Philips Montez Morita, M.D.                      Marlowe Kays, M.D.    PC/MEDQ  D:  02/22/2002  T:  02/23/2002  Job:  [id number]

## 2010-08-06 NOTE — Discharge Summary (Signed)
Shannon, [name]                           ACCOUNT NO.:  [id number]   MEDICAL RECORD NO.:  [id number]                   PATIENT TYPE:  INP   LOCATION:  0379                                 FACILITY:  [facility]   PHYSICIAN:  Marlowe Kays, M.D.               DATE OF BIRTH:  [birth year]   DATE OF ADMISSION:  02/22/2002  DATE OF DISCHARGE:  03/01/2002                                 DISCHARGE SUMMARY   ADMISSION DIAGNOSES:  1. Spinal stenosis of L3-4 and L4-5.  2. Hypothyroidism.  3. Hypertension.  4. Gastroesophageal reflux disease.  5. Hyperlipidemia.   DISCHARGE DIAGNOSES:  1. Spinal stenosis of L3-4 and L4-5, status post central decompressive     lumbar laminectomy at L3-4 and L4-5 with dural tear.  2. Hypothyroidism.  3. Hypertension.  4. Gastroesophageal reflux disease.  5. Hyperlipidemia.   PROCEDURE:  The patient was taken to the operating room on 02/22/02, and  underwent a central decompressive laminectomy at L3-4 and L4-5.  Surgery was  done under general anesthesia.  Surgeon was Dr. Marlowe Kays, assistants  were Dr. Ronnell Guadalajara and Dr. Sharolyn Douglas, also resulting dural tear at the  time of surgery.   CONSULTATIONS:  1. Dr. Aleen Campi of cardiology.  2. Dr. Jacinto Halim.  3. Physical therapy.  4. Occupational therapy.  5. Social work case Insurance account manager.   HISTORY OF PRESENT ILLNESS:  The patient is a 77 year old female seen by Dr.  Simonne [name] for continuing and progressive problems concerning her low back.  She unfortunately has had no changes in her neurological status to the lower  extremities, just pure and consistent pain.  She is requiring hydrocodone  for discomfort.  She is a really active lady with a large supportive family.  She is having a considerable amount of pain and discomfort which interferes  with her day-to-day activities.  She had previously been seen by Dr. Zenaida [name]  who chose conservative care with no avail unfortunately.  After medical  clearance from  Dr. Aleen Campi, it was felt that she would benefit from  surgical intervention, and is being admitted for decompressive lumbar  laminectomy central portion at L3-4 and L4-5.   LABORATORY DATA:  CBC on admission showed hemoglobin 12.2, hematocrit 36.6,  white blood cell count 5.9, red blood cell count 3.89.  Serial CBC's were  taken throughout hospital stay.  Hemoglobin and hematocrit did decline to  10.9 and 32.3 on 02/23/02.  The white blood cell count also went up to 11.2  on 02/22/02.  Differential on admission was all within normal limits.  Cardiac enzymes on admission all within normal limits.  Routine chemistry on  admission showed glucose high at 156.  Chemistries on 02/22/02, revealed a  sodium slightly low at 134, glucose high at 158.  Chemistry on 02/23/02,  showed sodium had returned to normal at 135, and glucose was high at 136.  Urinalysis on admission showed  a moderate amount of leukocyte esterase, a  few epithelials, a few bacteria, hylan casts.  EKG on admission showed sinus  rhythm with frequent and consecutive premature ventricular complexes in a  pattern of bigeminy, and possible premature atrial complexes with aberrant  conduction and T-wave abnormalities, consider inferior ischemia, T-wave  abnormality, consider anterior ischemia, and prolonged QT.   HOSPITAL COURSE:  The patient was admitted to [facility] and taken  to the operating room.  She underwent the above stated procedure.  The  patient tolerated the procedure well, and was allowed to return to the  recovery room and then to the orthopaedic floor for continued postoperative  care.  On the day of surgery, the patient was followed up by Dr. Jacinto Halim for  Dr. Aleen Campi for cardiology who followed her heart status throughout her  hospital stay which remained stable, and also followed her blood pressure  throughout the hospital stay.  On postoperative day #1, 02/23/02, seen by  orthopaedics.  The patient was  comfortable, had no complaints,  neurovascularly intact bilateral lower extremities.  Remained on bed rest,  head of the bed less then 20 degrees.  No physical therapy due to dural  tear.  On 02/23/02, followed up by cardiology.  Bradycardia and bigeminy  immediately postoperative, now resolved to normal sinus rhythm with  occasional premature atrial contractions.  No ischemia on EKG.  Hypertension  was unstable control.  Lopressor was increased to 25 mg b.i.d.  The patient  complained of no chest pain and no shortness of breath at the time of being  seen.  On 02/24/02, seen by orthopaedics.  Dressing was dry.  Moved lower  extremities.  On 02/24/02, seen by orthopaedics.  Was doing well  orthopaedically.  Was waiting on cardiology clearance for physical therapy.  On 02/26/02, postoperative day #4, seen by orthopaedics.  The patient was  resting comfortably, no complaints, no nausea.  Moved bilateral lower  extremities. Had good plantar dorsiflexion.  Awaiting clearance from  cardiology for physical therapy.  On 02/26/02, seen by cardiology.  Cardiology cleared for physical therapy from a cardiac standpoint.  On  02/27/02, postoperative day #5, seen by orthopaedics.  The patient was  resting comfortably, no complaints.  Temperature max 101.1.  The patient was  working with physical therapy, possible discharge the following day.  On  02/28/02, seen by orthopaedics.  The patient was up with physical therapy,  but due to slow progress felt she would benefit from another stay in the  hospital.  On 03/01/02, seen by orthopaedics.  The patient was much improved  from previous day, and was ready for discharge.  She was thus discharged  home on 03/01/02.   DISPOSITION:  The patient was discharged home on 03/01/02.   DISCHARGE MEDICATIONS:  1. Percocet 5/325 mg one or two p.o. q.4-6h. p.r.n.  2. Robaxin 500 mg one p.o. q.8h. p.r.n.   DIET:  As tolerated.  ACTIVITY:  The patient is to up as  tolerated.  No lifting.  No prolonged  sitting or standing.  No bending.   FOLLOWUP:  The patient is to follow up with Dr. Simonne [name] in one week.  She  is to call the office for an appointment.   CONDITION ON DISCHARGE:  Stable and improved.        Clarene Reamer, P.A.-C.                   Marlowe Kays, M.D.    SW/MEDQ  D:  03/12/2002  T:  03/12/2002  Job:  [id number]

## 2010-08-06 NOTE — Discharge Summary (Signed)
NAMEHARNOOR, [name]               ACCOUNT NO.:  [id number]   MEDICAL RECORD NO.:  [id number]           PATIENT TYPE:   LOCATION:                                 FACILITY:   PHYSICIAN:  Mobolaji B. Bakare, M.D.DATE OF BIRTH:  Jul 25, 1923   DATE OF ADMISSION:  DATE OF DISCHARGE:                                 DISCHARGE SUMMARY   CONSULTATIONS:  Neurology, Dr. Pearlean Brownie.   CHIEF COMPLAINT:  Weakness, left facial droop.   FINAL DIAGNOSIS:  Transient ischemic attack.   SECONDARY DIAGNOSES:  Hypertension, hyperlipidemia.   HISTORY OF PRESENT ILLNESS:  [name] is an African American female  who was brought to the emergency department with complaints of transient  left-sided facial droop and slurred speech which was observed by her son.  In addition, the patient could not walk straight as reported by her son.  She was brought to the emergency department and was found to have a systolic  blood pressure of 200 and this was treated in the ER.  At the time of  evaluation blood pressure had improved.  Neurologic symptoms have improved  and hence the patient was admitted for further evaluation and observation.   PHYSICAL EXAMINATION ON ADMISSION:  VITAL SIGNS:  Blood pressure 180  systolic.  At the time of discharge blood pressure was 120/71.  Heart rate  82, O2 saturations 100%.  Physical examination was unremarkable.  Specifically, there were no  neurologic deficits.   LABORATORY DATA:  Homocysteine level 12.6.  Urinalysis insignificant.  Total  cholesterol 271, triglycerides 153, HDL 33, LDL 157.  LFT normal.  Hemoglobin A1c 6.5.  Glucose 100, sodium 124, potassium 5.1, creatinine 1.5,  BUN 20.  A 2-D echocardiogram showed ventricular systole function to be normal  ejection fraction of 60% to 70%.  There was no regional  wall function  abnormalities, no intracardial source of emboli noted.  Head CT scan:  Lacunar infarct on left thalamus .No intracranial hemorrhage.  MRI head  and neck with MRA showed no acute infarct. There is subtle  enhancement along the posterior right lenticular nucleus which may be  related to  subacute infarction, moderate small vessel disease. MRA showed  findings suggestive of significant intracranial arteriosclerotic type  changes.  Fifty percent to 60% focal stenosis of right internal carotid  artery 2cm beyond it's origin, mild bulbous dilatation of the left internal  carotid artery 4.5cm beyond it's origin.    Carotid ultrasound showed no significant intracranial stenosis Vertebral  flow is antegrade.   HOSPITAL COURSE:  [name] was admitted for observation.  She remained  hemodynamically stable and neurologically stable.  There was no further  development of neurological deficits.  She was noted to have elevated LDL.  She was started on Aggrenox (prior to admission the patient was on aspirin).  It was also noted that she had been on Caduet  5/40 once a day, Tricor added  to her medication for better cholesterol control.  Blood pressure remained  stable during the course of observation and it was deemed fit that the  patient could be  discharged home.  She did not require any physical therapy.  She was able to ambulate without assistance.  She was discharged home in  stable condition on the following medications:   DISCHARGE MEDICATIONS:  1.  Aggrenox one p.o. b.i.d.  2.  Labetalol 100 mg p.o. b.i.d.  3.  Caduet 5/40 one per day.  4.  Tricor 45 mg once a day.  5.  Fosamax 70 mg once a week.   She was instructed to stop aspirin, to hold hydrochlorothiazide until she  sees Dr. Jonnie Kind in the office, continue Synthroid 50 mcg daily as  before Tylenol p.r.n. for headache.  Labetalol was introduced to better  control blood pressure which was well controlled after discharge.   DIET:  Low salt, low cholesterol diet.   FOLLOWUP:  Follow up with Dr. Jonnie Kind in 1-2 weeks.  The patient is to  follow up with Dr. Pearlean Brownie in  office in two months.      MBB/MEDQ  D:  07/21/2004  T:  07/21/2004  Job:  [id number]

## 2010-08-06 NOTE — H&P (Signed)
[name], [name]                           ACCOUNT NO.:  [id number]   MEDICAL RECORD NO.:  [id number]                   PATIENT TYPE:  INP   LOCATION:  NA                                   FACILITY:  [facility]   PHYSICIAN:  Marlowe Kays, M.D.               DATE OF BIRTH:  [birth year]   DATE OF ADMISSION:  02/22/2002  DATE OF DISCHARGE:                                HISTORY & PHYSICAL   CHIEF COMPLAINT:  Pain in my back and legs.   HISTORY OF PRESENT ILLNESS:  This 77 year old female had been seen by Dr.  Simonne Come for continuing progressive problems concerning her low back.  She  has had unfortunately no changes in her neurological status in the lower  extremities, primarily just pure and consistent pain.  She has required  Hydrocodone for her discomfort.  She is really an active lady with a large  supportive family and she is having considerable amount of pain and  discomfort which interferes with day to day activities.  She had been seen  by Dr. Ethelene Hal who has used conservative care with little to no avail  unfortunately.  After medical clearance from Dr. Aleen Campi it was felt she  would benefit from surgical intervention, being admitted for a decompressive  lumbar laminectomy, central portion, at L3-L4, L4-L5.   MRI evaluations have shown this severe multifactorial spinal stenosis at  those levels.   PAST MEDICAL HISTORY:  This lady has been in relatively good health  throughout her lifetime.  She has had no surgeries.  She is currently being  treated by Dr. Nicholos Johns and Dr. Aleen Campi for various situations.  Dr.  Aleen Campi has cleared her cardiology-wise.  She does have a history of  congestive heart failure and had an MI in the past.  She now is being  treated for hypertension, ulcer disease, and GERD, and hypothyroidism.   CURRENT MEDICATIONS:  1. Synthroid one tab q.d.  2. Metoprolol b.i.d.  3. Hydrochlorothiazide one q.d.  4. Zantac 75 one q.d.  5. Valtrex one  every other day.  6. Zocor one tab q.d.   ALLERGIES:  She has no known drug allergies.   SOCIAL HISTORY:  The patient has a large family of support.  She neither  smokes nor drinks.  She is a widow.   FAMILY HISTORY:  Father had heart disease, hypertension, diabetes, and  arthritis.   REVIEW OF SYSTEMS:  CENTRAL NERVOUS SYSTEM:  No seizures, __________,  paralysis, numbness, double vision.  The patient does have pain that is  consistent with bilateral neuralgias in the lower extremities.  CARDIOVASCULAR:  No chest pain, no angina, no orthopnea.  RESPIRATORY:  No  productive cough.  She does have some shortness of breath with exertion.  GASTROINTESTINAL:  No nausea, vomiting, melena, bloody stools.  GENITOURINARY:  No discharge, dysuria, or hematuria.   PHYSICAL EXAMINATION:  GENERAL:  Alert and cooperative,  friendly, petite, 37-  year-old white female.  VITAL SIGNS:  Blood pressure 130/66, pulse 72, respirations 12.  HEENT:  Normocephalic.  PERRLA.  Oropharynx is clear.  CHEST:  Clear to auscultation.  No rhonchi, no rales.  HEART:  Regular rate and rhythm.  No murmurs are heard.  ABDOMEN:  Soft, nontender.  Liver and spleen not felt.  GENITALIA/RECTAL/PELVIC/BREASTS:  Not done, not pertinent to present  illness.  EXTREMITIES:  Negative straight leg bilaterally.  Motor is grossly intact.  Sensory is grossly intact.   ADMISSION DIAGNOSES:  1. Spinal stenosis, L3-L4 and L4-L5.  2. Hypothyroidism.  3. Hypertension.  4. Gastroesophageal reflux disease.  5. Hyperlipidemia.   PLAN:  The patient will undergo central decompressive lumbar laminectomy at  L3-L4, L4-L5.  We will certainly ask Dr. Aleen Campi to follow along with [place]  should the patient have any cardiology problems and we will also ask Dr.  Nicholos Johns to follow should we have any medical problems.     Dooley L. Shela Nevin, P.A.             Marlowe Kays, M.D.    DLU/MEDQ  D:  02/18/2002  T:  02/18/2002  Job:   [id number]   cc:   Aram Candela. Aleen Campi, M.D.  [address]  [place]  [place] [address]  Fax: [phone]   Georgianne Fick, M.D.  [address]  [place]  [place] [address]  Fax: [phone]

## 2010-08-06 NOTE — Cardiovascular Report (Signed)
NAMESHAMYRA, [name]                           ACCOUNT NO.:  [id number]   MEDICAL RECORD NO.:  [id number]                   PATIENT TYPE:  OBV   LOCATION:  4703                                 FACILITY:  [facility]   PHYSICIAN:  Darlin Priestly, M.D.             DATE OF BIRTH:  1923/09/12   DATE OF PROCEDURE:  DATE OF DISCHARGE:                              CARDIAC CATHETERIZATION   PROCEDURE:  1. Left heart catheterization.  2. Coronary angiography.  3. Left ventriculogram.  4. Selective bilateral renal angiogram.   INDICATION:  [name] is a 77 year old female patient of Dr. Aggie Cosier  with a history of hypertension, history of hyperlipidemia, history of renal  insufficiency, who was admitted on April 18, 2003, with two episodes of  substernal chest pain.  The patient has a history of questionable remote  percutaneous intervention.  However, she does not remember the year.  She is  now referred for repeat catheterization to reassess her coronary artery  disease.   DESCRIPTION OF PROCEDURE:  After obtaining informed consent, the patient was  brought to the cardiac catheterization laboratory.  The right groin was  shaved, prepped and draped in the usual sterile fashion. An ECG monitor was  established. Using a modified Seldinger technique, #6 French arterial sheath  was inserted in the right femoral artery.  A 6-French diagnostic catheter  was used to perform diagnostic angiography.  This revealed a large left main  with no significant disease.  The LAD is a large vessel which courses to the  apex.  There are two diagonal branches.  The LAD has no significant disease.  The first diagonal is a medium-size vessel with marked disease distally, and  had a 70% ostial lesion.   The second diagonal is a medium-sized vessel with no significant disease.   The left circumflex is a medium-sized vessel that courses in the AV groove  and there is one obtuse marginal branch.  The AV groove  circumflex noted to  have 60% proximal AV groove lesion.   The first OM is a medium-sized vessel which bifurcates distally and has no  significant disease.   The right coronary artery is a large vessel which is dominant.  It gives  rise to both PDA as well as posterolateral branch.  The RCA is noted to have  diffuse 40% proximal and 40% distal disease.  The PDA has no significant  disease.   The posterolateral branch is a medium-size vessel with 50% ostial, 50%  proximal and 70% mid-vessel disease, although this is a small vessel.   LEFT VENTRICULOGRAM:  The left ventriculogram by hand injection revealed  mildly depressed ejection fraction of 35 to 40% with mild global  hypokinesis.   SELECTIVE BILATERAL RENAL ANGIOGRAM:  Revealed a small left renal artery  which appears to be diffusely disease with pressure dampening.   The right renal artery is a small but appears  patent.   HEMODYNAMICS:  Systemic arterial pressure 222/91, LV systolic pressure  230/18, LVEDP of 28.   CONCLUSIONS:  1. Significant two-vessel coronary artery disease.  2. Mildly depressed left-ventricular systolic function.  3. Diffuse disease of the left renal artery.  4. Systemic hypertension.  5. Elevated left ventricular end-diastolic pressure.                                               Darlin Priestly, M.D.    RHM/MEDQ  D:  04/18/2003  T:  04/20/2003  Job:  [id number]   cc:   Jaclyn Prime. Lucas Mallow, M.D.  [address]  [place]  [place] [address]  Fax: [phone]

## 2010-08-06 NOTE — Procedures (Signed)
[place]. [facility]  Patient:    [name], [name] Visit Number: [id number] MRN: [id number]          Service Type: Attending:  Sabino Gasser, M.D. Proc. Date: 11/08/00                             Procedure Report  PROCEDURE PERFORMED:  Colonoscopy.  ENDOSCOPIST:  Sabino Gasser, M.D.  INDICATIONS FOR PROCEDURE:  Colon cancer screening.  ANESTHESIA:  Demerol 10 mg, Versed 5 mg.  DESCRIPTION OF PROCEDURE:  With the patient mildly sedated in the left lateral decubitus position, the Olympus videoscopic colonoscope was inserted in the rectum and passed under direct vision with pressure applied to the abdomen and patient rolled onto her back and subsequently to her right and subsequently back to her left side.  We reached the cecum.  The cecum was identified by the ileocecal valve and appendiceal orifice.  From this point after photographs had been taken, the colonoscope was slowly withdrawn, taking circumferential views of the entire colonic mucosa, stopping only then in the rectum, which appeared normal on direct and retroflex view.  The endoscope was straightened and withdrawn.  Patients vital signs and pulse oximeter remained stable.  The patient tolerated the procedure well and without apparent complications.  FINDINGS:  Essentially negative colonoscopic examination.  PLAN:  See endoscopy note for further details. Attending:  Sabino Gasser, M.D. DD:  11/08/00 TD:  11/08/00 Job: [id number]

## 2010-08-06 NOTE — H&P (Signed)
[name], [name]                           ACCOUNT NO.:  [id number]   MEDICAL RECORD NO.:  [id number]                   PATIENT TYPE:  OBV   LOCATION:  1824                                 FACILITY:  [facility]   PHYSICIAN:  Jaclyn Prime. Lucas Mallow, M.D.                DATE OF BIRTH:  [birth year]   DATE OF ADMISSION:  04/18/2003  DATE OF DISCHARGE:                                HISTORY & PHYSICAL   [name] is a 77 year old black woman who was admitted to [name] for further evaluation of chest pain.   The patient reportedly has a remote history of myocardial infarction and  PTCA.  The hospital records are not available, so no details regarding these  events are known at this time.  In any case, the patient presented to the  emergency department after experiencing a two-hour episode of chest pain.  This began while she was sedentary.  The chest pain was described as a  fullness or bubble in a focal area of the distal substernal region.  It  radiated to beneath her left breast.  It was not associated with dyspnea,  diaphoresis, or nausea.  There were no exacerbating or ameliorating factors.  It appeared to be unrelated to position, activity, meals, or respirations.  She took two nitroglycerin tablets, which seemed to have a minimal effect.  The total duration of the chest pain was two hours.  She presented to the  emergency department, at which point her chest discomfort was largely  resolved.   There is no history of congestive heart failure or arrhythmia.   The patient has a history of hypertension and hyperlipidemia.  There was no  history of diabetes mellitus, smoking, or family history of early coronary  artery disease.   The patient lives with her son.   She is not allergic to any medications.   Her current medications include Synthroid, aspirin, nitroglycerin,  metoprolol, and hydrochlorothiazide.   Previous operation:  Back surgery.   The patient also has a  history of hypothyroidism.   Review of systems reveals no new problems related to her head, eyes, ears,  nose, mouth, throat, lungs, gastrointestinal system, genitourinary system,  or extremities.  There is no history of neurologic or psychiatric disorder.  There is no history of fever, chills, or weight loss.   PHYSICAL EXAMINATION:  VITAL SIGNS:  Blood pressure 160/72, pulse 82 and  regular, respirations 20, temperature 97.1.  GENERAL:  The patient was a comfortable, elderly black woman in no distress.  She was alert and responsive.  HEENT:  Head, eyes, nose, and mouth were normal.  NECK:  Without thyromegaly or adenopathy.  Carotid pulses were palpable  bilaterally and without bruits.  CARDIAC:  Normal S1 and S2.  There was no S3, S4, murmur, rub, or click.  Cardiac rhythm was regular.  CHEST:  No chest wall tenderness was noted.  The lungs were clear.  ABDOMEN:  Soft and nontender.  There was no mass, hepatosplenomegaly, bruit,  distention, rebound, guarding, or rigidity.  Bowel sounds were normal.  BREASTS, PELVIC, RECTAL:  Not performed, as they were not pertinent to the  reason for acute care hospitalization.  EXTREMITIES:  Without edema, deviation, or deformity.  Radial and dorsalis  pedal pulses were palpable bilaterally.  NEUROLOGIC:  Brief screening neurologic survey demonstrated no focal  neurologic findings.   The electrocardiogram revealed normal sinus rhythm with diffuse T-wave  flattening.  The chest radiograph report was pending at the time of this  dictation.  The initial set of cardiac markers revealed an initial myoglobin  of 84.9, CK-MB of 1.6, and troponin less than 0.05.  The second set revealed  a myoglobin of 99.2, CK-MB 1.5, and troponin less than 0.05.  The third set  revealed a myoglobin of 93.3, CK-MB 1.5, and troponin less than 0.05.  Potassium was 4.3, BUN 22, and creatinine 1.5.  White count was 6.4 with a  hemoglobin of 11.7 and hematocrit 35.4.  The  remaining studies were pending  at the time of this dictation.   IMPRESSION:  1. Chest pain, rule out cardiac ischemia.  2. Coronary artery disease, status post myocardial infarction, status post     percutaneous transluminal coronary angioplasty in the remote past.  The     records, and therefore details, are not yet available.  3. Hypertension.  4. Dyslipidemia.  5. Hypothyroidism.   PLAN:  1. Telemetry.  2. Serial cardiac enzymes.  3. Aspirin.  4. Nitroglycerin paste or intravenous nitroglycerin.  5. Continue metoprolol.  6. Heparin.  7. Further measures per Dr. Lucas Mallow.      Quita Skye. Waldon Reining, MD                   Jaclyn Prime. Lucas Mallow, M.D.    MSC/MEDQ  D:  04/18/2003  T:  04/18/2003  Job:  [id number]   cc:   Quita Skye. Waldon Reining, MD  [address]  [place], [place] [address]  Fax: [phone]

## 2010-08-06 NOTE — Procedures (Signed)
[place]. [facility]  Patient:    [name], [name] Visit Number: [id number] MRN: [id number]          Service Type: END Location: ENDO Attending Physician:  Sabino Gasser Proc. Date: 11/08/00 Adm. Date:  11/08/2000                             Procedure Report  PROCEDURE PERFORMED:  Upper endoscopy.  ENDOSCOPIST:  Sabino Gasser, M.D.  INDICATIONS FOR PROCEDURE:  Abdominal pain.  ANESTHESIA:  Demerol 40 mg, Versed  5 mg.  DESCRIPTION OF PROCEDURE:  With the patient mildly sedated in the left lateral decubitus position, the Olympus video endoscope was inserted in the mouth and passed under direct vision through the esophagus which appeared normal into the stomach.  The fundus, body, antrum, duodenal bulb and second portion of the duodenum were all visualized.  From this point, the endoscope was slowly withdrawn taking circumferential views of the entire duodenal mucosa until the endoscope had been pulled back into the stomach and placed on retroflexion to view the stomach from below.  The endoscope was then straightened and withdrawn taking circumferential views of the entire gastric and esophageal mucosa, stopping in the body of the stomach to photograph and biopsy some erythematous areas.  The endoscope was withdrawn.  Patients vital signs and pulse oximeter remained stable.  The patient tolerated the procedure well without apparent complications.  FINDINGS:  Diffuse erythema, mild of stomach, await biopsy report.  Proceed to colonoscopy as planned.  Patient will call me for results and follow up with me as an outpatient.    PLAN: Attending Physician:  Sabino Gasser DD:  11/08/00 TD:  11/08/00 Job: [id number]

## 2010-09-02 ENCOUNTER — Ambulatory Visit
Admission: RE | Admit: 2010-09-02 | Discharge: 2010-09-02 | Disposition: A | Discharge status: Home / Self Care | Payer: Medicare Other | Source: Ambulatory Visit | Attending: Pain Medicine | Admitting: Pain Medicine

## 2010-09-02 ENCOUNTER — Other Ambulatory Visit: Payer: Self-pay | Admitting: Pain Medicine

## 2010-09-02 DIAGNOSIS — M25569 Pain in unspecified knee: Secondary | ICD-10-CM

## 2010-10-03 ENCOUNTER — Observation Stay (HOSPITAL_COMMUNITY)
Admission: EM | Admit: 2010-10-03 | Discharge: 2010-10-12 | DRG: 286 | Disposition: A | Discharge status: Home / Self Care | Payer: Medicare Other | Attending: Internal Medicine | Admitting: Internal Medicine

## 2010-10-03 ENCOUNTER — Emergency Department (HOSPITAL_COMMUNITY): Payer: Medicare Other

## 2010-10-03 DIAGNOSIS — E039 Hypothyroidism, unspecified: Secondary | ICD-10-CM | POA: Insufficient documentation

## 2010-10-03 DIAGNOSIS — Z01818 Encounter for other preprocedural examination: Secondary | ICD-10-CM | POA: Insufficient documentation

## 2010-10-03 DIAGNOSIS — M109 Gout, unspecified: Secondary | ICD-10-CM | POA: Insufficient documentation

## 2010-10-03 DIAGNOSIS — R0989 Other specified symptoms and signs involving the circulatory and respiratory systems: Secondary | ICD-10-CM | POA: Insufficient documentation

## 2010-10-03 DIAGNOSIS — M25559 Pain in unspecified hip: Secondary | ICD-10-CM | POA: Insufficient documentation

## 2010-10-03 DIAGNOSIS — IMO0001 Reserved for inherently not codable concepts without codable children: Secondary | ICD-10-CM | POA: Insufficient documentation

## 2010-10-03 DIAGNOSIS — M25569 Pain in unspecified knee: Secondary | ICD-10-CM | POA: Insufficient documentation

## 2010-10-03 DIAGNOSIS — R0602 Shortness of breath: Secondary | ICD-10-CM | POA: Insufficient documentation

## 2010-10-03 DIAGNOSIS — R61 Generalized hyperhidrosis: Secondary | ICD-10-CM | POA: Insufficient documentation

## 2010-10-03 DIAGNOSIS — Z01810 Encounter for preprocedural cardiovascular examination: Secondary | ICD-10-CM | POA: Insufficient documentation

## 2010-10-03 DIAGNOSIS — I200 Unstable angina: Secondary | ICD-10-CM | POA: Insufficient documentation

## 2010-10-03 DIAGNOSIS — M353 Polymyalgia rheumatica: Secondary | ICD-10-CM | POA: Insufficient documentation

## 2010-10-03 DIAGNOSIS — E119 Type 2 diabetes mellitus without complications: Secondary | ICD-10-CM | POA: Insufficient documentation

## 2010-10-03 DIAGNOSIS — I2510 Atherosclerotic heart disease of native coronary artery without angina pectoris: Principal | ICD-10-CM | POA: Insufficient documentation

## 2010-10-03 DIAGNOSIS — D649 Anemia, unspecified: Secondary | ICD-10-CM | POA: Insufficient documentation

## 2010-10-03 DIAGNOSIS — N184 Chronic kidney disease, stage 4 (severe): Secondary | ICD-10-CM | POA: Insufficient documentation

## 2010-10-03 DIAGNOSIS — Z8673 Personal history of transient ischemic attack (TIA), and cerebral infarction without residual deficits: Secondary | ICD-10-CM | POA: Insufficient documentation

## 2010-10-03 DIAGNOSIS — R0609 Other forms of dyspnea: Secondary | ICD-10-CM | POA: Insufficient documentation

## 2010-10-03 DIAGNOSIS — I129 Hypertensive chronic kidney disease with stage 1 through stage 4 chronic kidney disease, or unspecified chronic kidney disease: Secondary | ICD-10-CM | POA: Insufficient documentation

## 2010-10-03 DIAGNOSIS — E785 Hyperlipidemia, unspecified: Secondary | ICD-10-CM | POA: Insufficient documentation

## 2010-10-03 DIAGNOSIS — Z01812 Encounter for preprocedural laboratory examination: Secondary | ICD-10-CM | POA: Insufficient documentation

## 2010-10-03 LAB — POCT I-STAT, CHEM 8
BUN: 31 mg/dL — ABNORMAL HIGH (ref 6–23)
Creatinine, Ser: 2.1 mg/dL — ABNORMAL HIGH (ref 0.50–1.10)
Glucose, Bld: 111 mg/dL — ABNORMAL HIGH (ref 70–99)
Hemoglobin: 11.9 g/dL — ABNORMAL LOW (ref 12.0–15.0)
Potassium: 4.9 mEq/L (ref 3.5–5.1)

## 2010-10-03 LAB — DIFFERENTIAL
Basophils Absolute: 0 10*3/uL (ref 0.0–0.1)
Eosinophils Relative: 2 % (ref 0–5)
Lymphocytes Relative: 24 % (ref 12–46)
Lymphs Abs: 1.5 10*3/uL (ref 0.7–4.0)
Monocytes Absolute: 0.5 10*3/uL (ref 0.1–1.0)
Monocytes Relative: 9 % (ref 3–12)
Neutro Abs: 4.1 10*3/uL (ref 1.7–7.7)

## 2010-10-03 LAB — CBC
HCT: 34.1 % — ABNORMAL LOW (ref 36.0–46.0)
Hemoglobin: 11.2 g/dL — ABNORMAL LOW (ref 12.0–15.0)
MCH: 32.3 pg (ref 26.0–34.0)
MCHC: 32.8 g/dL (ref 30.0–36.0)
MCV: 98.3 fL (ref 78.0–100.0)
RDW: 14.9 % (ref 11.5–15.5)

## 2010-10-03 LAB — CK TOTAL AND CKMB (NOT AT ARMC): CK, MB: 5.4 ng/mL — ABNORMAL HIGH (ref 0.3–4.0)

## 2010-10-04 LAB — COMPREHENSIVE METABOLIC PANEL
ALT: 36 U/L — ABNORMAL HIGH (ref 0–35)
Alkaline Phosphatase: 33 U/L — ABNORMAL LOW (ref 39–117)
CO2: 20 mEq/L (ref 19–32)
Chloride: 106 mEq/L (ref 96–112)
GFR calc Af Amer: 37 mL/min — ABNORMAL LOW (ref >60)
GFR calc non Af Amer: 30 mL/min — ABNORMAL LOW (ref >60)
Glucose, Bld: 105 mg/dL — ABNORMAL HIGH (ref 70–99)
Potassium: 4.4 mEq/L (ref 3.5–5.1)
Sodium: 139 mEq/L (ref 135–145)
Total Bilirubin: 0.3 mg/dL (ref 0.3–1.2)

## 2010-10-04 LAB — MAGNESIUM: Magnesium: 1.9 mg/dL (ref 1.5–2.5)

## 2010-10-04 LAB — DIFFERENTIAL
Basophils Absolute: 0 10*3/uL (ref 0.0–0.1)
Eosinophils Relative: 2 % (ref 0–5)
Lymphocytes Relative: 28 % (ref 12–46)
Monocytes Absolute: 0.5 10*3/uL (ref 0.1–1.0)

## 2010-10-04 LAB — HEPARIN LEVEL (UNFRACTIONATED): Heparin Unfractionated: 0.76 IU/mL — ABNORMAL HIGH (ref 0.30–0.70)

## 2010-10-04 LAB — PROTIME-INR: Prothrombin Time: 13.9 seconds (ref 11.6–15.2)

## 2010-10-04 LAB — CARDIAC PANEL(CRET KIN+CKTOT+MB+TROPI): Total CK: 132 U/L (ref 7–177)

## 2010-10-04 LAB — HEMOGLOBIN A1C
Hgb A1c MFr Bld: 7 % — ABNORMAL HIGH (ref <5.7)
Mean Plasma Glucose: 154 mg/dL — ABNORMAL HIGH (ref <117)

## 2010-10-04 LAB — PRO B NATRIURETIC PEPTIDE: Pro B Natriuretic peptide (BNP): 16872 pg/mL — ABNORMAL HIGH (ref 0–450)

## 2010-10-04 LAB — CBC
HCT: 31.6 % — ABNORMAL LOW (ref 36.0–46.0)
MCHC: 32.6 g/dL (ref 30.0–36.0)
MCV: 97.8 fL (ref 78.0–100.0)
RDW: 14.9 % (ref 11.5–15.5)

## 2010-10-04 LAB — GLUCOSE, CAPILLARY: Glucose-Capillary: 99 mg/dL (ref 70–99)

## 2010-10-05 LAB — BASIC METABOLIC PANEL
BUN: 26 mg/dL — ABNORMAL HIGH (ref 6–23)
Chloride: 106 mEq/L (ref 96–112)
GFR calc Af Amer: 32 mL/min — ABNORMAL LOW (ref >60)
Glucose, Bld: 115 mg/dL — ABNORMAL HIGH (ref 70–99)
Potassium: 4.5 mEq/L (ref 3.5–5.1)
Sodium: 137 mEq/L (ref 135–145)

## 2010-10-05 LAB — GLUCOSE, CAPILLARY: Glucose-Capillary: 124 mg/dL — ABNORMAL HIGH (ref 70–99)

## 2010-10-05 LAB — CBC
HCT: 30.4 % — ABNORMAL LOW (ref 36.0–46.0)
MCV: 97.1 fL (ref 78.0–100.0)
Platelets: 221 10*3/uL (ref 150–400)
RBC: 3.13 MIL/uL — ABNORMAL LOW (ref 3.87–5.11)
WBC: 6.1 10*3/uL (ref 4.0–10.5)

## 2010-10-06 LAB — CBC
Hemoglobin: 9.8 g/dL — ABNORMAL LOW (ref 12.0–15.0)
MCH: 31.8 pg (ref 26.0–34.0)
MCV: 96.8 fL (ref 78.0–100.0)
RBC: 3.08 MIL/uL — ABNORMAL LOW (ref 3.87–5.11)

## 2010-10-06 LAB — BASIC METABOLIC PANEL
CO2: 23 mEq/L (ref 19–32)
Calcium: 8.9 mg/dL (ref 8.4–10.5)
Creatinine, Ser: 1.83 mg/dL — ABNORMAL HIGH (ref 0.50–1.10)
Glucose, Bld: 115 mg/dL — ABNORMAL HIGH (ref 70–99)
Sodium: 137 mEq/L (ref 135–145)

## 2010-10-06 LAB — GLUCOSE, CAPILLARY
Glucose-Capillary: 161 mg/dL — ABNORMAL HIGH (ref 70–99)
Glucose-Capillary: 112 mg/dL — ABNORMAL HIGH (ref 70–99)
Glucose-Capillary: 156 mg/dL — ABNORMAL HIGH (ref 70–99)

## 2010-10-06 LAB — TSH: TSH: 184.246 u[IU]/mL — ABNORMAL HIGH (ref 0.350–4.500)

## 2010-10-06 LAB — HEPARIN LEVEL (UNFRACTIONATED): Heparin Unfractionated: 0.35 IU/mL (ref 0.30–0.70)

## 2010-10-07 ENCOUNTER — Inpatient Hospital Stay (HOSPITAL_COMMUNITY): Payer: Medicare Other

## 2010-10-07 LAB — CBC
MCH: 32.3 pg (ref 26.0–34.0)
MCHC: 33.1 g/dL (ref 30.0–36.0)
Platelets: 182 10*3/uL (ref 150–400)
RBC: 2.82 MIL/uL — ABNORMAL LOW (ref 3.87–5.11)

## 2010-10-07 LAB — IRON AND TIBC: Iron: 78 ug/dL (ref 42–135)

## 2010-10-07 LAB — HEPARIN LEVEL (UNFRACTIONATED): Heparin Unfractionated: 0.34 IU/mL (ref 0.30–0.70)

## 2010-10-07 LAB — FOLATE: Folate: 17.3 ng/mL

## 2010-10-07 LAB — BASIC METABOLIC PANEL
Calcium: 9 mg/dL (ref 8.4–10.5)
Creatinine, Ser: 2.01 mg/dL — ABNORMAL HIGH (ref 0.50–1.10)
GFR calc Af Amer: 28 mL/min — ABNORMAL LOW (ref >60)
GFR calc non Af Amer: 24 mL/min — ABNORMAL LOW (ref >60)
Sodium: 142 mEq/L (ref 135–145)

## 2010-10-07 LAB — VITAMIN B12: Vitamin B-12: 365 pg/mL (ref 211–911)

## 2010-10-07 LAB — TROPONIN I: Troponin I: <0.3 ng/mL (ref <0.30)

## 2010-10-07 LAB — FERRITIN: Ferritin: 245 ng/mL (ref 10–291)

## 2010-10-08 LAB — BASIC METABOLIC PANEL
CO2: 22 mEq/L (ref 19–32)
Calcium: 9.4 mg/dL (ref 8.4–10.5)
Chloride: 108 mEq/L (ref 96–112)
Potassium: 4.7 mEq/L (ref 3.5–5.1)
Sodium: 141 mEq/L (ref 135–145)

## 2010-10-08 LAB — CBC
MCH: 31.9 pg (ref 26.0–34.0)
Platelets: 192 10*3/uL (ref 150–400)
RBC: 2.95 MIL/uL — ABNORMAL LOW (ref 3.87–5.11)
WBC: 5.8 10*3/uL (ref 4.0–10.5)

## 2010-10-08 LAB — GLUCOSE, CAPILLARY
Glucose-Capillary: 87 mg/dL (ref 70–99)
Glucose-Capillary: 92 mg/dL (ref 70–99)
Glucose-Capillary: 117 mg/dL — ABNORMAL HIGH (ref 70–99)

## 2010-10-09 LAB — BASIC METABOLIC PANEL
CO2: 18 mEq/L — ABNORMAL LOW (ref 19–32)
Calcium: 9.9 mg/dL (ref 8.4–10.5)
Glucose, Bld: 170 mg/dL — ABNORMAL HIGH (ref 70–99)
Potassium: 4.7 mEq/L (ref 3.5–5.1)
Sodium: 137 mEq/L (ref 135–145)

## 2010-10-09 LAB — CBC
HCT: 29.3 % — ABNORMAL LOW (ref 36.0–46.0)
MCH: 31.9 pg (ref 26.0–34.0)
MCV: 98.3 fL (ref 78.0–100.0)
RDW: 15.1 % (ref 11.5–15.5)
WBC: 5.9 10*3/uL (ref 4.0–10.5)

## 2010-10-09 LAB — GLUCOSE, CAPILLARY
Glucose-Capillary: 150 mg/dL — ABNORMAL HIGH (ref 70–99)
Glucose-Capillary: 158 mg/dL — ABNORMAL HIGH (ref 70–99)

## 2010-10-10 LAB — GLUCOSE, CAPILLARY
Glucose-Capillary: 116 mg/dL — ABNORMAL HIGH (ref 70–99)
Glucose-Capillary: 138 mg/dL — ABNORMAL HIGH (ref 70–99)

## 2010-10-10 LAB — CBC
MCHC: 32.9 g/dL (ref 30.0–36.0)
Platelets: 239 10*3/uL (ref 150–400)
RDW: 15.1 % (ref 11.5–15.5)

## 2010-10-10 LAB — BASIC METABOLIC PANEL
GFR calc Af Amer: 19 mL/min — ABNORMAL LOW (ref >60)
GFR calc non Af Amer: 16 mL/min — ABNORMAL LOW (ref >60)
Potassium: 4.6 mEq/L (ref 3.5–5.1)
Sodium: 136 mEq/L (ref 135–145)

## 2010-10-10 LAB — TSH: TSH: 169.519 u[IU]/mL — ABNORMAL HIGH (ref 0.350–4.500)

## 2010-10-11 ENCOUNTER — Inpatient Hospital Stay (HOSPITAL_COMMUNITY): Payer: Medicare Other

## 2010-10-11 ENCOUNTER — Other Ambulatory Visit (HOSPITAL_COMMUNITY): Payer: Medicare Other

## 2010-10-11 LAB — URINALYSIS, ROUTINE W REFLEX MICROSCOPIC
Glucose, UA: NEGATIVE mg/dL
Leukocytes, UA: NEGATIVE
Protein, ur: NEGATIVE mg/dL
Specific Gravity, Urine: 1.005 (ref 1.005–1.030)
Urobilinogen, UA: 0.2 mg/dL (ref 0.0–1.0)

## 2010-10-11 LAB — CBC
HCT: 31.3 % — ABNORMAL LOW (ref 36.0–46.0)
MCH: 32 pg (ref 26.0–34.0)
MCHC: 32.9 g/dL (ref 30.0–36.0)
MCV: 97.2 fL (ref 78.0–100.0)
Platelets: 228 10*3/uL (ref 150–400)
RDW: 15 % (ref 11.5–15.5)
WBC: 5.9 10*3/uL (ref 4.0–10.5)

## 2010-10-11 LAB — CREATININE, URINE, RANDOM: Creatinine, Urine: 49.43 mg/dL

## 2010-10-11 LAB — BASIC METABOLIC PANEL
BUN: 53 mg/dL — ABNORMAL HIGH (ref 6–23)
Creatinine, Ser: 3.1 mg/dL — ABNORMAL HIGH (ref 0.50–1.10)
GFR calc non Af Amer: 14 mL/min — ABNORMAL LOW (ref >60)
Glucose, Bld: 102 mg/dL — ABNORMAL HIGH (ref 70–99)
Potassium: 4.4 mEq/L (ref 3.5–5.1)

## 2010-10-11 LAB — SODIUM, URINE, RANDOM: Sodium, Ur: 26 mEq/L

## 2010-10-11 LAB — T4, FREE: Free T4: 0.78 ng/dL — ABNORMAL LOW (ref 0.80–1.80)

## 2010-10-12 LAB — GLUCOSE, CAPILLARY
Glucose-Capillary: 106 mg/dL — ABNORMAL HIGH (ref 70–99)
Glucose-Capillary: 166 mg/dL — ABNORMAL HIGH (ref 70–99)

## 2010-10-12 LAB — COMPREHENSIVE METABOLIC PANEL
Albumin: 3.8 g/dL (ref 3.5–5.2)
BUN: 59 mg/dL — ABNORMAL HIGH (ref 6–23)
Calcium: 10.2 mg/dL (ref 8.4–10.5)
Creatinine, Ser: 3.03 mg/dL — ABNORMAL HIGH (ref 0.50–1.10)
Total Bilirubin: 0.3 mg/dL (ref 0.3–1.2)
Total Protein: 6.9 g/dL (ref 6.0–8.3)

## 2010-10-12 LAB — DIFFERENTIAL
Eosinophils Absolute: 0.3 10*3/uL (ref 0.0–0.7)
Eosinophils Relative: 5 % (ref 0–5)
Lymphocytes Relative: 24 % (ref 12–46)
Lymphs Abs: 1.5 10*3/uL (ref 0.7–4.0)
Monocytes Absolute: 0.6 10*3/uL (ref 0.1–1.0)

## 2010-10-12 LAB — PRO B NATRIURETIC PEPTIDE: Pro B Natriuretic peptide (BNP): 1260 pg/mL — ABNORMAL HIGH (ref 0–450)

## 2010-10-12 LAB — CBC
HCT: 30.3 % — ABNORMAL LOW (ref 36.0–46.0)
MCHC: 33 g/dL (ref 30.0–36.0)
MCV: 97.1 fL (ref 78.0–100.0)
RDW: 15.1 % (ref 11.5–15.5)

## 2010-10-12 LAB — C3 COMPLEMENT: C3 Complement: 132 mg/dL (ref 90–180)

## 2010-10-12 LAB — PHOSPHORUS: Phosphorus: 4.8 mg/dL — ABNORMAL HIGH (ref 2.3–4.6)

## 2010-10-16 NOTE — Consult Note (Signed)
[name], [name]               ACCOUNT NO.:  [id number]  MEDICAL RECORD NO.:  [id number]  LOCATION:  3730                         FACILITY:  [facility]  PHYSICIAN:  Vylet Maffia L. Asheley Hellberg, M.D.DATE OF BIRTH:  [birth year]  DATE OF CONSULTATION: DATE OF DISCHARGE:                                CONSULTATION   REASON FOR CONSULTATION:  Chronic kidney disease with worsening.  HISTORY OF PRESENT ILLNESS:  As 77 year old female with history of hypertension, over 40 years history of coronary artery disease with initially in the past nonobstructive disease, normal EF with diastolic dysfunction, history of hypothyroidism, history of CVA in the past, history of gout, type 2 diabetes, CKD III with the creatinine of 1.7- 2.1, was admitted with short of breath, dyspnea on exertion, chest pain, creatinine is 1.62-2.12 on two different checks.  She was cathed on October 05, 2010, found to have some significant coronary artery disease and angioplasty and anticipated.  Her creatinine has continued to rise.  She has some heart failure with elevated BNP on admission and has come down during her hospitalization with furosemide and blood pressure control. Her creatinine was on October 11, 2010, today was 3.1, October 10, 2010, 2.85, October 09, 2010, 2.34, October 09, 2010, 0.81, October 07, 2010, 2.01, October 06, 2010, 1.83.  Of interest is in her past medical history and review of her old chart reveals her right kidney is only 9.6 cm and left 6.9 and this was in 2009.  EF at this time has gone from 55% to 35% on echo. She was seen by Dr. Briant Cedar in 2009.  She also had an nephrosclerosis and nonfunctional kidney.  BNP has come down this admission.  She also was found to be severely hypothyroid this admission.  She has history of dyslipidemia, atrial fib in the past, some mitral annular calcifications, mild aortic stenosis, polymyalgias.  HOME MEDICATIONS: 1. Vicodin p.r.n. 2. Labetalol 100 mg b.i.d. 3. Metoprolol 25 mg  b.i.d. 4. Levothyroxine 100 mcg a day. 5. Metformin 500 mg a day. 6. Quinine 324 mg a day. 7. Aggrenox once a day. 8. Caduet 5/40.  She also has history of type 2 diabetes.  We do not     really know much about the control.  REVIEW OF SYSTEMS:  HEENT:  She denies headache.  She has some decreased vision, had a cataract done on the left side.  She does not have teeth. She denies sore throat, dry eyes or mouth , headaches, or hearing difficulties.  However, daughter says she does have hearing difficulties.  CARDIOVASCULAR:  As listed above.  She has had shortness of breath with mild exertion.  She denies orthopnea.  GU:  Denies nocturia, but is not clear that has not happened according to her daughter.  CHEST:  She had chest pain on admission.  GI:  She denies history of indigestion, heartburn, hepatitis, jaundice, diarrhea, constipation.  She has not had a bowel movement for couple of days.  She denies any, but her daughter says she has left hip and knee pain frequently.  NEUROLOGIC:  Denies any complaints at the current time. SKIN:  Unremarkable.  Her past medical history includes the above illnesses .  ALLERGIES:  Aspirin cause an upset stomach, but really no true allergies.  MEDICATIONS:  She does take quinine at home in addition to the meds listed above.  SOCIAL HISTORY:  Ex-smoker, quit 30 years ago.  No alcohol consumption. Lives with her son here at [place].  FAMILY HISTORY:  Negative for a renal disease.  She has a brother and sister with heart disease.  PHYSICAL EXAMINATION:  GENERAL:  Elderly female, does not answer questions very clearly. VITAL SIGNS:  Temperature is 97.8, blood pressure 117/72, 97% room air sat. HEENT:  Dense right cataract.  She is edentulous. NECK:  Without masses or thyromegaly. LUNGS:  Slight decreased breath sounds.  Normal to percussion.  Slightly decreased expansion.  No rales, rhonchi, or wheezes. CARDIOVASCULAR:  Regular rhythm.  No  S4.  Grade 2/6 systolic ejection murmur best heard at the left sternal border and middle sternal border. PMI is 10 cm out from the left midclavicular line in the fifth intercostal space.  She has no significant edema today.  She has bilateral femoral bruits and no dorsalis pedal pulses are palpable. ABDOMEN:  Active bowel sounds.  Liver is down 4-5 cm, soft, nontender. SKIN:  Without lesion except she has trophic changes in the feet.  Hemoglobin is 10.3, white count 5900, platelets 228,000.  Sodium 138, potassium 4.4, chloride 99, creatinine is 3.1 BUN 52, glucose 112.  I's and O's have not been well kept.  ASSESSMENT: 1. Chronic kidney disease stage IIIB now up to IV.  GFR has gone from     30 to 17.  There is some mild acute kidney injury, which is     multifactorial, i.e., the factors are: 2. Diet. 3. Blood pressure control, which is improved. 4. Diuresis with blood pressure control.  Suspect the blood pressure     control is the primary issue.  Need to rule out atheroembolic     disease, AIN, and acute interstitial disease, obstruction.  She has     mild acidemia and anemia complications.  Overall, her blood     pressure okay at this time, need to let her blood pressure     stabilize  and let it rise.  We will need to check a urine, to     check for inflammation, and also check an ultrasound to rule out     any kind of obstruction. 5. Peripheral vascular 6. Coronary artery disease. 7. Anemia. 8. Hypothyroidism, may play a role in decreasing the GFR but not     acutely. 9. Memory issues.  PLAN: 1. Ultrasound. 2. Urinalysis. 3. Urine sodium and creatinine. 4. Discontinue the Norvasc and let her blood pressure rise. 5. Replete her thyroid. 6. Check PTH.          ______________________________ Llana [employer]. Lonya Johannesen, M.D.     JLD/MEDQ  D:  10/11/2010  T:  10/12/2010  Job:  [id number]  Electronically Signed by Beryle Lathe M.D. on 10/16/2010 03:24:13 PM

## 2010-10-19 NOTE — Discharge Summary (Signed)
[name], GIGLIA               ACCOUNT NO.:  [id number]  MEDICAL RECORD NO.:  [id number]  LOCATION:  3730                         FACILITY:  [facility]  PHYSICIAN:  [place] Marianna Cid, MD         DATE OF BIRTH:  [birth year]  DATE OF ADMISSION:  10/03/2010 DATE OF DISCHARGE:  10/12/2010                              DISCHARGE SUMMARY   DISCHARGE DIAGNOSES: 1. Unstable angina.  Negative cardiac enzymes. 2. History of coronary artery disease, diffuse disease cath 2009, 90%     diagonal. 3. Hypothyroidism severe, noncompliant on Synthroid, repleted. 4. Diabetes mellitus type 2. 5. Anemia.  Anemia studies within normal limits. 6. Chronic kidney disease stage IV. 7. Myxedema heart. 8. Increased uric acid. 9. Acute-on-chronic heart failure, systolic, diastolic, primary     systolic.  HOSPITAL COURSE:  Ms. Colvin is a 77 year old African American female with history of coronary artery disease status post PTCA in the past. Last cardiac cath was June 2009 which showed right dominant coronary system with mild-to-moderate coronary artery disease in left anterior descending, circumflex, and right coronary arteries.  At that time there was no significant stenosis that warranted intervention.  She also has a history of hypertension, diabetes mellitus type 2, atrial fibrillation, hypothyroidism, dyslipidemia, CVA, chronic kidney disease, gout, polymyalgia rheumatica.  She presented to [facility] with complaints of chest pain.  She was admitted to telemetry to rule out myocardial infarction, started on IV heparin, nitroglycerin paste.  Beta- blocker was continued.  Labs were drawn.  A 2-D echocardiogram was ordered and completed which showed an ejection fraction of 30-40%, moderate diffuse hypokinesis with distinct regional wall motion abnormalities.  Grade 1 diastolic dysfunction.  There was moderate concentric hypertrophy in the left ventricle, mild LVH.  This showed a severely elevated  ventricular end-diastolic filling pressures and left atrial filling pressure.  Mitral valve with mildly calcified annulus. Initial chest x-ray showed cardiomegaly and central pulmonary vascular prominence.  Calcified aortic root with slight increased markings, lung base on the right.  Left heart catheterization was performed on October 05, 2010.  First diagonal showed an ostial 40% lesion, mid diagonal just proximal to bifurcation showed 80% narrowing.  It was a 1.5 mm vessel. This diagonal vessel was likely the culprit vessel.  Initial BNP was 16,872 and thyroid stimulating hormone was 187.209.  The patient continued to be chest pain free.  Blood pressure was slightly elevated. [facility] were consulted for increased TSH.  The patient was started on IV Synthroid.  The patient states she was not compliant with her Synthroid medication.  On July 20, the patient was started on IV Lasix 40 mg b.i.d., this was continued for 2 days.  The patient had response to the IV diuretics.  Her admission weight was 55.3 kg. Discharge weight will be 49.8 kg.  Ms. Felan had worsening creatinine function throughout her stay which seems to have stabilized from the last 2 days.  Renal consult was requested.  I recommend Lasix 40 mg daily and 100 mg of allopurinol daily due to increased uric acid.  Her Imdur was increased to 60 mg daily.  IV Synthroid was changed to p.o. at  15 mcg daily.  BNP has improved to 1260.  Currently she is chest pain and shortness of breath free, has been seen by Dr. Rennis Golden and considered stable for discharge home.  DISCHARGE LABS:  WBC 6.0, hemoglobin 10.0, hematocrit 30.3, platelets 237,000.  Sodium 137, potassium 4.5, chloride 102, carbon dioxide 22, glucose 117, BUN 59, creatinine 3.03, total bilirubin 0.3, alkaline phosphatase 40, AST 28, ALT 35, total protein 6.9, albumin 3.8, calcium 10.2, phosphorus 4.8, uric acid 11.1.  A1c is 7.0, total cholesterol 282, triglycerides 90,  HDL 112, LDL 152, VLDL 18, total cholesterol HDL ratio was 2.5.  Free T4 was 0.78, thyroid stimulating hormone on October 09, 2010 is 169.519.  Iron was 78, total iron binding capacity was 285, percent saturation 27, UIBC 207.  Vitamin B12 was 365, serum folate 17.3, ferritin was 245.  C3 complement 132, C4 complement 42.  Urinary creatinine was 49.43.  Urine sodium 26.  Urinalysis was within normal limits.  The patient's fecal occult blood negative.  STUDIES/PROCEDURES: 1. Renal ultrasound October 11, 2010 shows bilateral renal atrophy,     marked on the left.  No hydronephrosis.  Right kidney measuring 8.7     cm, cortical thinning, no hydronephrosis.  Left kidney measuring     3.9 cm, cortical thinning, no hydronephrosis.  Bladder within     normal limits. 2. Chest x-ray October 07, 2010 shows no evidence of acute     cardiopulmonary disease. 3. A 2-D echocardiogram October 04, 2010 shows left ventricle wall     thickness with increased pattern of moderate LVH.  There was     moderate concentric hypertrophy.  Systolic pressure was moderately     severely reduced.  Estimated ejection fraction was 30-40%.  There     was moderate diffuse hypokinesis.  No wall motion abnormalities.     Grade I diastolic dysfunction.  E/E prime Doppler parameters were     consistent both severely elevated ventricular end-diastolic filling     pressure and left atrial filling pressure.  Severe hypokinesis of     the apical myocardium, mild hypokinesis of the mid apical inferior,     apical septal, and apical lateral myocardium, possible mild     hypokinesis of the mid anterior septal and mid inferior septal     myocardium.  This showed abnormalities most likely infarction.     Aortic valve showed mildly calcified annulus.  Trileaflet, no     stenosis or significant regurgitation.  The mitral valve showed     mild calcified annulus.  Mild thickened leaflets.  No stenosis or     significant regurgitation. 4. Cardiac  catheterization that was done on October 05, 2010, left main     was normal.  Circumflex showed mild-to-moderate irregularities,     proximal less than 60%.  LAD crossed the apex.  Second diagonal was     okay.  First diagonal showed ostial 40%, mid diagonal just proximal     to bifurcation with 80% narrowing, 1.5 mm vessel.  RCA was a large     vessel, proximal 30%, sequential irregularities and minimal     irregularities.  Mid minimal irregularities, PDA okay, ongoing RCA     after PDA 50-60%.  DISCHARGE MEDICATIONS: 1. Acetaminophen 325 mg 2 tablets by mouth every 4 hours as needed for     pain. 2. Allopurinol 100 mg 1 tablet by mouth daily. 3. Atorvastatin 40 mg 1 tablet by mouth daily. 4. Furosemide 40 mg  1 tablet by mouth daily. 5. Imdur 60 mg 1 tablet by mouth daily. 6. Levothyroxine 50 mcg 1 tablet by mouth daily before breakfast. 7. Protonix 40 mg 1 tablet by mouth daily. 8. Aggrenox 25/200 mg 1 capsule by mouth twice daily. 9. Labetalol 100 mg 1 tablet by mouth twice daily at 0800 and 1800     hours. 10.Metformin 500 mg 1 tablet by mouth daily. 11.Nitroglycerin sublingual 0.4 mg 1 tablet under the tongue every 5     minutes up to 3 doses for chest pain. 12.Quinine 324 mg 1 capsule by mouth daily at bedtime. 13.Vicodin 5/500, 1/2 tablet by mouth every 6 hours as needed for     pain.  DISPOSITION:  Ms. Drzewiecki was discharged home in stable condition. Increase her activity slowly.  It is recommended she eat a low-sodium heart-healthy low-carbohydrate diet.  She will follow up with Dr. Rennis Golden and our office will call with the appointment and time.  She is also recommended to follow up with Dr. Briant Cedar in approximately 1 month and to call for an appointment.  It is also recommended she follows up with her primary care doctor regarding her elevated TSH.  It is recommended she follows up with Dr. Nicholos Johns this week regarding her increased  TSH.    ______________________________ Wilburt Finlay, PA   ______________________________ [place] Milledge Gerding, MD    BH/MEDQ  D:  10/12/2010  T:  10/12/2010  Job:  [id number]  cc:   [place] Ivoree Felmlee, MD Dyke Maes, M.D. Georgianne Fick, M.D.  Electronically Signed by Wilburt Finlay PA on 10/18/2010 02:18:52 PM Electronically Signed by Kirtland Bouchard. Buell Parcel M.D. on 10/19/2010 01:32:02 PM

## 2010-12-16 LAB — CBC
HCT: 24.9 — ABNORMAL LOW
HCT: 25.8 — ABNORMAL LOW
HCT: 26.1 — ABNORMAL LOW
HCT: 32 — ABNORMAL LOW
Hemoglobin: 8.4 — ABNORMAL LOW
Hemoglobin: 8.8 — ABNORMAL LOW
Hemoglobin: 9.2 — ABNORMAL LOW
Hemoglobin: 9.5 — ABNORMAL LOW
Hemoglobin: 9.6 — ABNORMAL LOW
MCHC: 32.9
MCHC: 33.3
MCHC: 33.6
MCHC: 34
MCV: 89
MCV: 89.4
Platelets: 268
Platelets: 316
Platelets: 428 — ABNORMAL HIGH
RBC: 2.8 — ABNORMAL LOW
RBC: 2.93 — ABNORMAL LOW
RBC: 3.21 — ABNORMAL LOW
RBC: 3.26 — ABNORMAL LOW
RBC: 3.49 — ABNORMAL LOW
RDW: 15.4
RDW: 15.6 — ABNORMAL HIGH
RDW: 16 — ABNORMAL HIGH
RDW: 16 — ABNORMAL HIGH
RDW: 16.4 — ABNORMAL HIGH
WBC: 10.5
WBC: 9.1

## 2010-12-16 LAB — COMPREHENSIVE METABOLIC PANEL
ALT: 32
Albumin: 2.8 — ABNORMAL LOW
Alkaline Phosphatase: 39
BUN: 26 — ABNORMAL HIGH
CO2: 21
Calcium: 9.1
Creatinine, Ser: 1.71 — ABNORMAL HIGH
Glucose, Bld: 288 — ABNORMAL HIGH
Potassium: 5
Sodium: 137
Total Bilirubin: 1.1
Total Protein: 5.3 — ABNORMAL LOW
Total Protein: 6.2

## 2010-12-16 LAB — BASIC METABOLIC PANEL
BUN: 23
BUN: 30 — ABNORMAL HIGH
CO2: 17 — ABNORMAL LOW
CO2: 18 — ABNORMAL LOW
CO2: 18 — ABNORMAL LOW
CO2: 20
CO2: 21
Calcium: 8.6
Calcium: 9
Calcium: 9.1
Chloride: 100
Chloride: 100
Chloride: 104
Chloride: 105
Chloride: 106
Creatinine, Ser: 1.71 — ABNORMAL HIGH
Creatinine, Ser: 2.69 — ABNORMAL HIGH
Creatinine, Ser: 2.76 — ABNORMAL HIGH
GFR calc Af Amer: 33 — ABNORMAL LOW
GFR calc Af Amer: 34 — ABNORMAL LOW
GFR calc Af Amer: 35 — ABNORMAL LOW
GFR calc non Af Amer: 17 — ABNORMAL LOW
GFR calc non Af Amer: 26 — ABNORMAL LOW
GFR calc non Af Amer: 28 — ABNORMAL LOW
Glucose, Bld: 125 — ABNORMAL HIGH
Glucose, Bld: 148 — ABNORMAL HIGH
Glucose, Bld: 218 — ABNORMAL HIGH
Glucose, Bld: 225 — ABNORMAL HIGH
Glucose, Bld: 71
Potassium: 4.4
Potassium: 4.6
Potassium: 4.6
Potassium: 4.9
Sodium: 127 — ABNORMAL LOW
Sodium: 129 — ABNORMAL LOW
Sodium: 132 — ABNORMAL LOW
Sodium: 132 — ABNORMAL LOW
Sodium: 133 — ABNORMAL LOW
Sodium: 134 — ABNORMAL LOW
Sodium: 136

## 2010-12-16 LAB — URINE CULTURE
Colony Count: NO GROWTH
Culture: NO GROWTH

## 2010-12-16 LAB — RENAL FUNCTION PANEL
BUN: 31 — ABNORMAL HIGH
CO2: 18 — ABNORMAL LOW
Chloride: 105
Glucose, Bld: 58 — ABNORMAL LOW
Potassium: 4.5

## 2010-12-16 LAB — APTT: aPTT: 41 — ABNORMAL HIGH

## 2010-12-16 LAB — POCT I-STAT, CHEM 8
BUN: 28 — ABNORMAL HIGH
Hemoglobin: 11.6 — ABNORMAL LOW
Sodium: 132 — ABNORMAL LOW
TCO2: 20

## 2010-12-16 LAB — URINALYSIS, ROUTINE W REFLEX MICROSCOPIC
Glucose, UA: NEGATIVE
Hgb urine dipstick: NEGATIVE
Leukocytes, UA: NEGATIVE
pH: 5.5

## 2010-12-16 LAB — HEMOGLOBIN A1C
Hgb A1c MFr Bld: 8.1 — ABNORMAL HIGH
Mean Plasma Glucose: 211

## 2010-12-16 LAB — T4: T4, Total: 6.6

## 2010-12-16 LAB — PROTIME-INR
INR: 1.1
Prothrombin Time: 14.8

## 2010-12-16 LAB — CARDIAC PANEL(CRET KIN+CKTOT+MB+TROPI)
CK, MB: 1.1
CK, MB: 1.4
CK, MB: 1.7
CK, MB: 2.4
Relative Index: INVALID
Relative Index: INVALID
Relative Index: INVALID
Total CK: 48
Total CK: 66
Troponin I: 0.01
Troponin I: 0.02
Troponin I: 0.02

## 2010-12-16 LAB — POCT CARDIAC MARKERS
Myoglobin, poc: 227
Operator id: 284251

## 2010-12-16 LAB — SODIUM, URINE, RANDOM: Sodium, Ur: 70

## 2010-12-16 LAB — DIFFERENTIAL
Basophils Absolute: 0
Lymphocytes Relative: 5 — ABNORMAL LOW
Lymphs Abs: 0.5 — ABNORMAL LOW
Monocytes Absolute: 0.3
Neutro Abs: 10.2 — ABNORMAL HIGH

## 2010-12-16 LAB — CORTISOL: Cortisol, Plasma: 24.5

## 2010-12-16 LAB — LIPID PANEL
Total CHOL/HDL Ratio: 1.8
VLDL: 14

## 2010-12-16 LAB — CULTURE, BLOOD (ROUTINE X 2): Culture: NO GROWTH

## 2010-12-16 LAB — HEMOGLOBINOPATHY EVALUATION
Hemoglobin Other: 0 (ref 0.0–0.0)
Hgb A2 Quant: 2.6
Hgb A: 97.4 %
Hgb F Quant: 0 (ref 0.0–2.0)

## 2010-12-16 LAB — URINE MICROSCOPIC-ADD ON

## 2010-12-16 LAB — FERRITIN: Ferritin: 1018 — ABNORMAL HIGH (ref 10–291)

## 2010-12-16 LAB — OCCULT BLOOD X 1 CARD TO LAB, STOOL: Fecal Occult Bld: NEGATIVE

## 2010-12-17 LAB — OCCULT BLOOD X 1 CARD TO LAB, STOOL: Fecal Occult Bld: NEGATIVE

## 2010-12-17 LAB — BASIC METABOLIC PANEL
BUN: 47 — ABNORMAL HIGH
Calcium: 8.6
Chloride: 101
Creatinine, Ser: 2.85 — ABNORMAL HIGH
GFR calc Af Amer: 19 — ABNORMAL LOW
GFR calc non Af Amer: 16 — ABNORMAL LOW

## 2010-12-21 LAB — SEDIMENTATION RATE: Sed Rate: 10

## 2011-02-07 ENCOUNTER — Observation Stay (HOSPITAL_COMMUNITY)
Admission: EM | Admit: 2011-02-07 | Discharge: 2011-02-09 | DRG: 313 | Disposition: A | Discharge status: Home / Self Care | Payer: Medicare Other | Attending: Cardiovascular Disease | Admitting: Cardiovascular Disease

## 2011-02-07 ENCOUNTER — Other Ambulatory Visit: Payer: Self-pay

## 2011-02-07 ENCOUNTER — Encounter: Payer: Self-pay | Admitting: *Deleted

## 2011-02-07 ENCOUNTER — Emergency Department (HOSPITAL_COMMUNITY): Payer: Medicare Other

## 2011-02-07 DIAGNOSIS — Z8673 Personal history of transient ischemic attack (TIA), and cerebral infarction without residual deficits: Secondary | ICD-10-CM

## 2011-02-07 DIAGNOSIS — I428 Other cardiomyopathies: Secondary | ICD-10-CM | POA: Diagnosis present

## 2011-02-07 DIAGNOSIS — I200 Unstable angina: Secondary | ICD-10-CM | POA: Diagnosis present

## 2011-02-07 DIAGNOSIS — D649 Anemia, unspecified: Secondary | ICD-10-CM | POA: Diagnosis present

## 2011-02-07 DIAGNOSIS — I252 Old myocardial infarction: Secondary | ICD-10-CM

## 2011-02-07 DIAGNOSIS — Z7982 Long term (current) use of aspirin: Secondary | ICD-10-CM

## 2011-02-07 DIAGNOSIS — E039 Hypothyroidism, unspecified: Secondary | ICD-10-CM | POA: Diagnosis present

## 2011-02-07 DIAGNOSIS — E038 Other specified hypothyroidism: Secondary | ICD-10-CM | POA: Diagnosis present

## 2011-02-07 DIAGNOSIS — R0789 Other chest pain: Principal | ICD-10-CM | POA: Diagnosis present

## 2011-02-07 DIAGNOSIS — Z9861 Coronary angioplasty status: Secondary | ICD-10-CM

## 2011-02-07 DIAGNOSIS — M109 Gout, unspecified: Secondary | ICD-10-CM | POA: Diagnosis present

## 2011-02-07 DIAGNOSIS — K219 Gastro-esophageal reflux disease without esophagitis: Secondary | ICD-10-CM | POA: Diagnosis present

## 2011-02-07 DIAGNOSIS — I129 Hypertensive chronic kidney disease with stage 1 through stage 4 chronic kidney disease, or unspecified chronic kidney disease: Secondary | ICD-10-CM | POA: Diagnosis present

## 2011-02-07 DIAGNOSIS — E119 Type 2 diabetes mellitus without complications: Secondary | ICD-10-CM | POA: Diagnosis present

## 2011-02-07 DIAGNOSIS — N183 Chronic kidney disease, stage 3 unspecified: Secondary | ICD-10-CM | POA: Diagnosis present

## 2011-02-07 DIAGNOSIS — Z79899 Other long term (current) drug therapy: Secondary | ICD-10-CM

## 2011-02-07 DIAGNOSIS — I2510 Atherosclerotic heart disease of native coronary artery without angina pectoris: Secondary | ICD-10-CM | POA: Diagnosis present

## 2011-02-07 HISTORY — DX: Essential (primary) hypertension: I10

## 2011-02-07 HISTORY — DX: Zoster without complications: B02.9

## 2011-02-07 HISTORY — DX: Atherosclerotic heart disease of native coronary artery without angina pectoris: I25.10

## 2011-02-07 HISTORY — DX: Other cardiomyopathies: I42.8

## 2011-02-07 HISTORY — DX: Anemia, unspecified: D64.9

## 2011-02-07 HISTORY — DX: Acute myocardial infarction, unspecified: I21.9

## 2011-02-07 HISTORY — DX: Hypothyroidism, unspecified: E03.9

## 2011-02-07 HISTORY — DX: Transient cerebral ischemic attack, unspecified: G45.9

## 2011-02-07 HISTORY — DX: Gastro-esophageal reflux disease without esophagitis: K21.9

## 2011-02-07 HISTORY — DX: Cerebral infarction, unspecified: I63.9

## 2011-02-07 LAB — BASIC METABOLIC PANEL
BUN: 40 mg/dL — ABNORMAL HIGH (ref 6–23)
CO2: 23 mEq/L (ref 19–32)
Calcium: 9.9 mg/dL (ref 8.4–10.5)
Chloride: 105 mEq/L (ref 96–112)
Creatinine, Ser: 2.76 mg/dL — ABNORMAL HIGH (ref 0.50–1.10)
GFR calc Af Amer: 17 mL/min — ABNORMAL LOW (ref >90)
GFR calc non Af Amer: 15 mL/min — ABNORMAL LOW (ref >90)
Glucose, Bld: 87 mg/dL (ref 70–99)
Potassium: 4.4 mEq/L (ref 3.5–5.1)
Sodium: 140 mEq/L (ref 135–145)

## 2011-02-07 LAB — CBC
HCT: 29.4 % — ABNORMAL LOW (ref 36.0–46.0)
Hemoglobin: 9.3 g/dL — ABNORMAL LOW (ref 12.0–15.0)
MCH: 30.3 pg (ref 26.0–34.0)
MCHC: 31.6 g/dL (ref 30.0–36.0)
MCV: 95.8 fL (ref 78.0–100.0)
Platelets: 325 10*3/uL (ref 150–400)
RBC: 3.07 MIL/uL — ABNORMAL LOW (ref 3.87–5.11)
RDW: 15.5 % (ref 11.5–15.5)
WBC: 5.3 10*3/uL (ref 4.0–10.5)

## 2011-02-07 LAB — TROPONIN I: Troponin I: <0.3 ng/mL (ref <0.30)

## 2011-02-07 MED ORDER — NITROGLYCERIN 0.4 MG SL SUBL: 0.4000 mg | SUBLINGUAL_TABLET | SUBLINGUAL | Status: DC | PRN

## 2011-02-07 NOTE — ED Provider Notes (Signed)
History     CSN: [id number] Arrival date & time: 02/07/2011  3:41 PM   First MD Initiated Contact with Patient 02/07/11 2042      Chief Complaint  Patient presents with  . Chest Pain    (Consider location/radiation/quality/duration/timing/severity/associated sxs/prior treatment) Patient is a 77 y.o. female presenting with chest pain. The history is provided by the patient (The patient complains of having chest pain off and on today. The pain has been helped by nitroglycerin she is also take an aspirin today. The patient states that her cardiologist is Dr. Rennis Golden).  Chest Pain The chest pain began yesterday. Chest pain occurs frequently. The chest pain is improving. The pain is associated with stress. At its most intense, the pain is at 5/10. The pain is currently at 2/10. The severity of the pain is moderate. The quality of the pain is described as aching. The pain does not radiate. Pertinent negatives for primary symptoms include no fever, no fatigue, no cough and no abdominal pain.  Pertinent negatives for associated symptoms include no numbness. She tried nitroglycerin for the symptoms.  Her past medical history is significant for CAD.  Pertinent negatives for past medical history include no COPD and no seizures.     Past Medical History  Diagnosis Date  . Coronary artery disease   . Myocardial infarct   . TIA (transient ischemic attack)   . Diabetes mellitus   . Hypertension   . Shingles   . Stroke   . Thyroid disease     History reviewed. No pertinent past surgical history.  No family history on file.  History  Substance Use Topics  . Smoking status: Never Smoker   . Smokeless tobacco: Not on file  . Alcohol Use: No    OB History    Grav Para Term Preterm Abortions TAB SAB Ect Mult Living                  Review of Systems  Constitutional: Negative for fever and fatigue.  HENT: Negative for congestion, sinus pressure and ear discharge.   Eyes: Negative for  discharge.  Respiratory: Negative for cough.   Cardiovascular: Positive for chest pain.  Gastrointestinal: Negative for abdominal pain and diarrhea.  Genitourinary: Negative for frequency and hematuria.  Musculoskeletal: Negative for back pain.  Skin: Negative for rash.  Neurological: Negative for seizures, numbness and headaches.  Hematological: Negative.   Psychiatric/Behavioral: Negative for hallucinations.    Allergies  Aspirin  Home Medications   Current Outpatient Rx  Name Route Sig Dispense Refill  . ACETAMINOPHEN 500 MG PO TABS Oral Take 500 mg by mouth as needed. For pain.     Marland Kitchen ALLOPURINOL 100 MG PO TABS Oral Take 100 mg by mouth daily.     . ATORVASTATIN CALCIUM 40 MG PO TABS Oral Take 40 mg by mouth daily.      Marland Kitchen CALCITRIOL 0.25 MCG PO CAPS Oral Take 0.25 mcg by mouth daily.      Marland Kitchen CARVEDILOL 6.25 MG PO TABS Oral Take 6.25 mg by mouth 2 (two) times daily with a meal.      . ASPIRIN-DIPYRIDAMOLE 25-200 MG PO CP12 Oral Take 1 capsule by mouth 2 (two) times daily.     . FUROSEMIDE 40 MG PO TABS Oral Take 40 mg by mouth daily.      . ISOSORBIDE MONONITRATE ER 60 MG PO TB24 Oral Take 60 mg by mouth daily.      Marland Kitchen LABETALOL HCL 100 MG  PO TABS Oral Take 50 mg by mouth 2 (two) times daily.      Marland Kitchen LEVOTHYROXINE SODIUM 150 MCG PO TABS Oral Take 150 mcg by mouth daily.      Marland Kitchen NITROGLYCERIN 0.4 MG SL SUBL Sublingual Place 0.4 mg under the tongue every 5 (five) minutes as needed. For chest pain     . PANTOPRAZOLE SODIUM 40 MG PO TBEC Oral Take 40 mg by mouth daily.      . QUININE SULFATE 324 MG PO CAPS Oral Take 324 mg by mouth at bedtime.      Marland Kitchen RANITIDINE HCL 150 MG PO TABS Oral Take 150 mg by mouth daily as needed. For heart burn.       BP 178/53  Pulse 68  Temp(Src) 97.5 F (36.4 C) (Oral)  Resp 20  SpO2 67%  Physical Exam  Constitutional: She is oriented to person, place, and time. She appears well-developed.  HENT:  Head: Normocephalic and atraumatic.  Eyes:  Conjunctivae and EOM are normal. No scleral icterus.  Neck: Neck supple. No thyromegaly present.  Cardiovascular: Normal rate and regular rhythm.  Exam reveals no gallop and no friction rub.   No murmur heard. Pulmonary/Chest: No stridor. She has no wheezes. She has no rales. She exhibits no tenderness.  Abdominal: She exhibits no distension. There is no tenderness. There is no rebound.  Musculoskeletal: Normal range of motion. She exhibits no edema.  Lymphadenopathy:    She has no cervical adenopathy.  Neurological: She is oriented to person, place, and time. Coordination normal.  Skin: No rash noted. No erythema.  Psychiatric: She has a normal mood and affect. Her behavior is normal.    ED Course  Procedures (including critical care time)  Labs Reviewed  CBC - Abnormal; Notable for the following:    RBC 3.07 (*)    Hemoglobin 9.3 (*)    HCT 29.4 (*)    All other components within normal limits  BASIC METABOLIC PANEL - Abnormal; Notable for the following:    BUN 40 (*)    Creatinine, Ser 2.76 (*)    GFR calc non Af Amer 15 (*)    GFR calc Af Amer 17 (*)    All other components within normal limits  TROPONIN I   Dg Chest 2 View  02/07/2011  *RADIOLOGY REPORT*  Clinical Data: Chest pain, shortness of breath, weakness, cough, hypertension, diabetes, former smoker  CHEST - 2 VIEW  Comparison: 10/07/2010  Findings: Upper normal heart size for AP lordotic technique. Calcified tortuous aorta. Pulmonary vascularity grossly normal for technique. Lungs appear emphysematous but clear. No pleural effusion or pneumothorax. Bones demineralized.  IMPRESSION: No acute abnormalities.  Original Report Authenticated By: Lollie Marrow, M.D.     1. Chest pain       MDM   Date: 02/07/2011  Rate: 74  Rhythm: normal sinus rhythm  QRS Axis: normal  Intervals: normal  ST/T Wave abnormalities: inverted t waves  Conduction Disutrbances:none  Narrative Interpretation:   Old EKG Reviewed: none  available    [facility] cardiology to come see the patient and admit for chest pain      Benny Lennert, MD 02/07/11 2307

## 2011-02-07 NOTE — ED Notes (Signed)
Pt returned from xray. No distress noted. Pt resting quietly in bed with family at bedside.

## 2011-02-07 NOTE — ED Notes (Addendum)
Pt states her pain is down to a 5/10 after o2 at 2 liters applied. There is st depression noted in lead2.

## 2011-02-07 NOTE — ED Notes (Signed)
The pt has had mid-chest pain since 1230 today at home.  No n sob or dizziness associated with this pain.  Pt  Alert no distress

## 2011-02-07 NOTE — ED Notes (Signed)
Pt resting quietly in bed. No distress noted. Pt asking for food. Family at bedside.

## 2011-02-08 ENCOUNTER — Encounter (HOSPITAL_COMMUNITY): Payer: Self-pay | Admitting: Physician Assistant

## 2011-02-08 ENCOUNTER — Other Ambulatory Visit (HOSPITAL_COMMUNITY): Payer: Self-pay | Admitting: *Deleted

## 2011-02-08 DIAGNOSIS — E039 Hypothyroidism, unspecified: Secondary | ICD-10-CM | POA: Diagnosis present

## 2011-02-08 DIAGNOSIS — E038 Other specified hypothyroidism: Secondary | ICD-10-CM | POA: Diagnosis present

## 2011-02-08 DIAGNOSIS — I429 Cardiomyopathy, unspecified: Secondary | ICD-10-CM

## 2011-02-08 DIAGNOSIS — I519 Heart disease, unspecified: Secondary | ICD-10-CM | POA: Diagnosis present

## 2011-02-08 DIAGNOSIS — I2510 Atherosclerotic heart disease of native coronary artery without angina pectoris: Secondary | ICD-10-CM | POA: Diagnosis present

## 2011-02-08 DIAGNOSIS — I428 Other cardiomyopathies: Secondary | ICD-10-CM

## 2011-02-08 DIAGNOSIS — I200 Unstable angina: Secondary | ICD-10-CM | POA: Diagnosis present

## 2011-02-08 HISTORY — DX: Other cardiomyopathies: I42.8

## 2011-02-08 HISTORY — DX: Cardiomyopathy, unspecified: I42.9

## 2011-02-08 LAB — CBC
Platelets: 367 10*3/uL (ref 150–400)
RDW: 15.5 % (ref 11.5–15.5)
WBC: 5 10*3/uL (ref 4.0–10.5)

## 2011-02-08 LAB — LIPID PANEL: LDL Cholesterol: 76 mg/dL (ref 0–99)

## 2011-02-08 LAB — CARDIAC PANEL(CRET KIN+CKTOT+MB+TROPI)
CK, MB: 2.3 ng/mL (ref 0.3–4.0)
CK, MB: 2.3 ng/mL (ref 0.3–4.0)
Relative Index: INVALID (ref 0.0–2.5)
Total CK: 70 U/L (ref 7–177)
Total CK: 57 U/L (ref 7–177)
Total CK: 58 U/L (ref 7–177)
Troponin I: <0.3 ng/mL (ref <0.30)

## 2011-02-08 LAB — URINALYSIS, ROUTINE W REFLEX MICROSCOPIC
Hgb urine dipstick: NEGATIVE
Nitrite: NEGATIVE
Specific Gravity, Urine: 1.012 (ref 1.005–1.030)
Urobilinogen, UA: 0.2 mg/dL (ref 0.0–1.0)

## 2011-02-08 LAB — URINE MICROSCOPIC-ADD ON

## 2011-02-08 LAB — TSH: TSH: 0.059 u[IU]/mL — ABNORMAL LOW (ref 0.350–4.500)

## 2011-02-08 LAB — CREATININE, SERUM
Creatinine, Ser: 2.71 mg/dL — ABNORMAL HIGH (ref 0.50–1.10)
GFR calc Af Amer: 17 mL/min — ABNORMAL LOW (ref >90)
GFR calc non Af Amer: 15 mL/min — ABNORMAL LOW (ref >90)

## 2011-02-08 LAB — HEPATIC FUNCTION PANEL
ALT: 12 U/L (ref 0–35)
AST: 19 U/L (ref 0–37)
Bilirubin, Direct: <0.1 mg/dL (ref 0.0–0.3)
Total Bilirubin: 0.3 mg/dL (ref 0.3–1.2)

## 2011-02-08 LAB — HEMOGLOBIN A1C: Hgb A1c MFr Bld: 6.9 % — ABNORMAL HIGH (ref <5.7)

## 2011-02-08 MED ORDER — GI COCKTAIL ~~LOC~~
30.0000 mL | Freq: Once | ORAL | Status: AC
  Administered 2011-02-08: 30 mL via ORAL
  Filled 2011-02-08: qty 30

## 2011-02-08 MED ORDER — ALBUTEROL SULFATE (5 MG/ML) 0.5% IN NEBU: 2.5000 mg | INHALATION_SOLUTION | RESPIRATORY_TRACT | Status: DC | PRN

## 2011-02-08 MED ORDER — MORPHINE SULFATE 4 MG/ML IJ SOLN
INTRAMUSCULAR | Status: AC
  Administered 2011-02-08: 1 mg via INTRAVENOUS
  Filled 2011-02-08: qty 1

## 2011-02-08 MED ORDER — SIMVASTATIN 20 MG PO TABS
20.0000 mg | ORAL_TABLET | Freq: Every day | ORAL | Status: DC
  Administered 2011-02-08: 20 mg via ORAL
  Filled 2011-02-08 (×2): qty 1

## 2011-02-08 MED ORDER — NITROGLYCERIN 2 % TD OINT
1.0000 [in_us] | TOPICAL_OINTMENT | Freq: Three times a day (TID) | TRANSDERMAL | Status: DC
  Administered 2011-02-08 – 2011-02-09 (×3): 1 [in_us] via TOPICAL
  Filled 2011-02-08: qty 30

## 2011-02-08 MED ORDER — SODIUM CHLORIDE 0.9 % IJ SOLN: 3.0000 mL | INTRAMUSCULAR | Status: DC | PRN

## 2011-02-08 MED ORDER — PANTOPRAZOLE SODIUM 40 MG PO TBEC
40.0000 mg | DELAYED_RELEASE_TABLET | Freq: Every day | ORAL | Status: DC
  Administered 2011-02-08 – 2011-02-09 (×2): 40 mg via ORAL
  Filled 2011-02-08 (×2): qty 1

## 2011-02-08 MED ORDER — NITROGLYCERIN 2 % TD OINT
1.0000 [in_us] | TOPICAL_OINTMENT | Freq: Once | TRANSDERMAL | Status: AC
  Administered 2011-02-08: 1 [in_us] via TOPICAL
  Filled 2011-02-08: qty 1

## 2011-02-08 MED ORDER — CARVEDILOL 6.25 MG PO TABS
6.2500 mg | ORAL_TABLET | Freq: Two times a day (BID) | ORAL | Status: DC
  Administered 2011-02-08 – 2011-02-09 (×3): 6.25 mg via ORAL
  Filled 2011-02-08 (×5): qty 1

## 2011-02-08 MED ORDER — MORPHINE SULFATE 4 MG/ML IJ SOLN
INTRAMUSCULAR | Status: AC
  Filled 2011-02-08: qty 1

## 2011-02-08 MED ORDER — ONDANSETRON HCL 8 MG PO TABS: 4.0000 mg | ORAL_TABLET | Freq: Four times a day (QID) | ORAL | Status: DC | PRN

## 2011-02-08 MED ORDER — NITROGLYCERIN 0.4 MG SL SUBL: 0.4000 mg | SUBLINGUAL_TABLET | SUBLINGUAL | Status: DC | PRN

## 2011-02-08 MED ORDER — ONDANSETRON HCL 4 MG/2ML IJ SOLN: 4.0000 mg | Freq: Four times a day (QID) | INTRAMUSCULAR | Status: DC | PRN

## 2011-02-08 MED ORDER — MORPHINE SULFATE 2 MG/ML IJ SOLN: 1.0000 mg | INTRAMUSCULAR | Status: DC | PRN

## 2011-02-08 MED ORDER — FERUMOXYTOL INJECTION 510 MG/17 ML
510.0000 mg | Freq: Once | INTRAVENOUS | Status: AC
  Administered 2011-02-09: 510 mg via INTRAVENOUS
  Filled 2011-02-08 (×2): qty 17

## 2011-02-08 MED ORDER — ONDANSETRON HCL 4 MG/2ML IJ SOLN
4.0000 mg | Freq: Once | INTRAMUSCULAR | Status: AC
  Administered 2011-02-08: 4 mg via INTRAVENOUS
  Filled 2011-02-08: qty 2

## 2011-02-08 MED ORDER — SODIUM CHLORIDE 0.9 % IJ SOLN
3.0000 mL | Freq: Two times a day (BID) | INTRAMUSCULAR | Status: DC
  Administered 2011-02-08 – 2011-02-09 (×4): 3 mL via INTRAVENOUS

## 2011-02-08 MED ORDER — NITROGLYCERIN 2 % TD OINT
1.0000 [in_us] | TOPICAL_OINTMENT | Freq: Once | TRANSDERMAL | Status: DC
  Filled 2011-02-08: qty 30

## 2011-02-08 MED ORDER — ENOXAPARIN SODIUM 30 MG/0.3ML ~~LOC~~ SOLN
30.0000 mg | Freq: Every day | SUBCUTANEOUS | Status: DC
  Administered 2011-02-08 – 2011-02-09 (×2): 30 mg via SUBCUTANEOUS
  Filled 2011-02-08 (×2): qty 0.3

## 2011-02-08 MED ORDER — FUROSEMIDE 40 MG PO TABS
40.0000 mg | ORAL_TABLET | Freq: Every day | ORAL | Status: DC
  Administered 2011-02-08 – 2011-02-09 (×2): 40 mg via ORAL
  Filled 2011-02-08 (×2): qty 1

## 2011-02-08 MED ORDER — SODIUM CHLORIDE 0.9 % IV SOLN
250.0000 mL | INTRAVENOUS | Status: DC
  Administered 2011-02-08: 1000 mL via INTRAVENOUS

## 2011-02-08 MED ORDER — SENNA 8.6 MG PO TABS
2.0000 | ORAL_TABLET | Freq: Every day | ORAL | Status: DC | PRN
  Filled 2011-02-08: qty 2

## 2011-02-08 MED ORDER — LEVOTHYROXINE SODIUM 150 MCG PO TABS
150.0000 ug | ORAL_TABLET | Freq: Every day | ORAL | Status: DC
  Administered 2011-02-08 – 2011-02-09 (×2): 150 ug via ORAL
  Filled 2011-02-08 (×3): qty 1

## 2011-02-08 MED ORDER — ACETAMINOPHEN 500 MG PO TABS: 500.0000 mg | ORAL_TABLET | Freq: Four times a day (QID) | ORAL | Status: DC | PRN

## 2011-02-08 MED ORDER — HYDROCODONE-ACETAMINOPHEN 5-325 MG PO TABS: 1.0000 | ORAL_TABLET | ORAL | Status: DC | PRN

## 2011-02-08 MED ORDER — MORPHINE SULFATE 2 MG/ML IJ SOLN
2.0000 mg | Freq: Once | INTRAMUSCULAR | Status: AC
  Administered 2011-02-08: 2 mg via INTRAVENOUS

## 2011-02-08 MED ORDER — ACETAMINOPHEN 650 MG RE SUPP: 650.0000 mg | Freq: Four times a day (QID) | RECTAL | Status: DC | PRN

## 2011-02-08 MED ORDER — ASPIRIN-DIPYRIDAMOLE ER 25-200 MG PO CP12
1.0000 | ORAL_CAPSULE | Freq: Two times a day (BID) | ORAL | Status: DC
  Administered 2011-02-08 – 2011-02-09 (×4): 1 via ORAL
  Filled 2011-02-08 (×5): qty 1

## 2011-02-08 NOTE — Progress Notes (Signed)
Pt. Continues with chest pain. Pressure pain. But does have chest pain with palpation to chest wall.  Also has had increased indigestion and coughing.  Will add GI cocktail, extra strength.  Would normally add IV toradal for chest wall pain but with elevated Cr. Will use tylenol.   Have ordered records from the office.  [facility] neg neg trop. So far.  Will have echo done at bedside due to continued pain.   Agree with note written by Nada Boozer RNP Nanetta Batty J 02/08/2011 1:08 PM Doubt cardiac pain. Still having low grade discomfort. Emperic antireflux treatment. No cath for now. Prior nuclear studies have been unrevealing.

## 2011-02-08 NOTE — Progress Notes (Signed)
  Echocardiogram 2D Echocardiogram has been performed.  [name] 02/08/2011, 3:00 PM

## 2011-02-08 NOTE — H&P (Signed)
[name] is an 77 y.o. female.   Chief Complaint: CP HPI: pt is a 77 y/o AAF with c/o of non exertional sscp sx while eating lunch earlier today. Pt rates CP 7/10 without radiation of sx to back, jaw, neck or shoulder.. Pt describes CP as a burning sensation with associated  SOB, cough, fever, chills, syncopal event or diaphoresis. Pt has a h/o of prior MI x three and multiple prior LHC with balloon angio but not PCI, most recently due to her CKD. ACS risk factors include h/o of TIA, DM, hypothyroidism, Gout, Anemia and HTN. Pt is a non smoker, non drinker and without fam h/o of premature CAD/AMI.  Past Medical History  Diagnosis Date  . Coronary artery disease   . Myocardial infarct   . TIA (transient ischemic attack)   . Diabetes mellitus   . Hypertension   . Shingles   . Stroke   . Thyroid disease   . Angina   . Hypothyroidism   . GERD (gastroesophageal reflux disease)   . Anemia     Past Surgical History  Procedure Date  . Cardiac catheterization     No family history on file. Social History:  reports that she has never smoked. She does not have any smokeless tobacco history on file. She reports that she does not drink alcohol or use illicit drugs.  Allergies:  Allergies  Allergen Reactions  . Aspirin Nausea And Vomiting    [name]     Medications Prior to Admission  Medication Dose Route Frequency Provider Last Rate Last Dose  . nitroGLYCERIN (NITROSTAT) SL tablet 0.4 mg  0.4 mg Sublingual Q5 min PRN Benny Lennert, MD       No current outpatient prescriptions on file as of 02/07/2011.    Results for orders placed during the hospital encounter of 02/07/11 (from the past 48 hour(s))  CBC     Status: Abnormal   Collection Time   02/07/11  7:35 PM      Component Value Range Comment   WBC 5.3  4.0 - 10.5 (K/uL)    RBC 3.07 (*) 3.87 - 5.11 (MIL/uL)    Hemoglobin 9.3 (*) 12.0 - 15.0 (g/dL)    HCT  16.1 (*) 09.6 - 46.0 (%)    MCV 95.8  78.0 - 100.0 (fL)    MCH 30.3  26.0 - 34.0 (pg)    MCHC 31.6  30.0 - 36.0 (g/dL)    RDW 04.5  40.9 - 81.1 (%)    Platelets 325  150 - 400 (K/uL)   BASIC METABOLIC PANEL     Status: Abnormal   Collection Time   02/07/11  7:35 PM      Component Value Range Comment   Sodium 140  135 - 145 (mEq/L)    Potassium 4.4  3.5 - 5.1 (mEq/L)    Chloride 105  96 - 112 (mEq/L)    CO2 23  19 - 32 (mEq/L)    Glucose, Bld 87  70 - 99 (mg/dL)    BUN 40 (*) 6 - 23 (mg/dL)    Creatinine, Ser 9.14 (*) 0.50 - 1.10 (mg/dL)    Calcium 9.9  8.4 - 10.5 (mg/dL)    GFR calc non Af Amer 15 (*) >90 (mL/min)    GFR calc Af Amer 17 (*) >90 (mL/min)   TROPONIN I     Status: Normal   Collection Time  02/07/11  7:36 PM      Component Value Range Comment   Troponin I <0.30  <0.30 (ng/mL)    Dg Chest 2 View  02/07/2011  *RADIOLOGY REPORT*  Clinical Data: Chest pain, shortness of breath, weakness, cough, hypertension, diabetes, former smoker  CHEST - 2 VIEW  Comparison: 10/07/2010  Findings: Upper normal heart size for AP lordotic technique. Calcified tortuous aorta. Pulmonary vascularity grossly normal for technique. Lungs appear emphysematous but clear. No pleural effusion or pneumothorax. Bones demineralized.  IMPRESSION: No acute abnormalities.  Original Report Authenticated By: Lollie Marrow, M.D.    Review of Systems  Constitutional: Positive for malaise/fatigue. Negative for fever, chills, weight loss and diaphoresis.  Cardiovascular: Positive for chest pain.  Gastrointestinal: Positive for heartburn. Negative for nausea, vomiting and abdominal pain.  Neurological: Positive for weakness.    Blood pressure 178/53, pulse 68, temperature 97.5 F (36.4 C), temperature source Oral, resp. rate 20, SpO2 67.00%. Physical Exam  Gen:  A & 0, NAD Integument: W/D Neck: Soft and Supple without JVD Pulm: CTA Cardio: Audible s1,s2 without M/G/R Ext: LE,cool, dry, no edema  bilat Vasc: DP pulses 1/4 of LE bilat   Assessment/Plan 1) [place], ACS protocol initiated, cycle CE q 6 hour times three, check  2De, possible LHC to further delineate coronaries pending renal fxs 2) H/o prior MI x three 3) CAD with prior balloon angio 4) Stage 3 CKD 5)h/o TIA 6)DM diet controlled 7)HTN 8)Hypothyroidism 9)Gout 10) anemia 11) DVT prophylaxis 12) Stress ulcer prophylaxis  SIMON,SPENCER E 02/08/2011, 12:22 AM Agree with note written by Donell Sievert [facility] Nanetta Batty J 02/08/2011 1:05 PM  Agree with note by SS PAC. Pt well known to [place]. Recent cath 7/12 with minimal CAD (diagonal Dz in 1.5 mm vessel). EF was 30% presumably secondary to hypothyroidism...> replaced. Now admitted with CP. Enz neg. EKG with LVH with repol changes unchanged from prior EKG. Exam benign. Doubt cardiac CP. Cycle enz, check 2D, PPI. With creat incresed have high threshold to recath.

## 2011-02-08 NOTE — ED Notes (Signed)
Pt medicated for pain. Pt resting quietly in bed. No distress noted. vss.

## 2011-02-08 NOTE — ED Notes (Signed)
Attempted to call report. States will call back.  

## 2011-02-09 MED ORDER — LEVOTHYROXINE SODIUM 125 MCG PO TABS: 125.0000 ug | ORAL_TABLET | Freq: Every day | ORAL | Status: DC

## 2011-02-09 NOTE — Discharge Summary (Signed)
Physician Discharge Summary  Patient ID: MCKENLEIGH TARLTON MRN: [id number] DOB/AGE: July 09, 1923 77 y.o.  Admit date: 02/07/2011 Discharge date: 02/09/2011  Admission Diagnoses:  Discharge Diagnoses:  Active Problems:  Atypical, Anterior chest wall pain   CKD (chronic kidney disease) stage 3, GFR 30-59 ml/min  CAD (coronary artery disease)  Cardiomyopathy, idiopathic  Hypothyroidism  Low TSH    Discharged Condition: stable  Hospital Course:   Patient is an 77 year old female with history of coronary disease, transient ischemic attack, diabetes mellitus, hypertension, shingles, stroke, hypothyroidism, angina, gastroesophageal reflux disease, anemia.  Presented with chest pain 7/10 in intensity without radiation symptoms to the back, jaw, neck or shoulder. She describes the chest pain as burning sensation.  Patient was admitted to rule out acute coronary syndrome.  Cardiac enzymes were cycled x3 and  negative. 2-D echocardiogram was ordered and showed ejection fraction range of 30 to 35% with severe inferior inferoseptal and apical hypokinesis grade 1 diastolic dysfunction. Secession unchanged from prior echocardiogram completed in July of this year.  Chest pain is more atypical. It is reproducible with inspiration and palpation.  TSH was 0.059. Her levothyroxine dosage was adjusted to 125 mcg daily. The patient was discharged home in stable condition.  Significant Diagnostic Studies:  2D-Echo11/20/12:  Left ventricle: The cavity size was normal. There was moderate concentric hypertrophy. Systolic function was moderately to severely reduced. The estimated ejection fraction was in the range of 30% to 35%. There is severe inferior, inferoseptal and apical hypokinesis. There is incoordinate septal motion. Doppler parameters are consistent with abnormal left ventricular relaxation (grade 1 diastolic dysfunction). The E/e' ratio is >10, suggesting elevated LV filling pressure. - Left atrium:  The atrium was at the upper limits of normal in size.  CXR 02/07/11: Findings:  Upper normal heart size for AP lordotic technique.  Calcified tortuous aorta.  Pulmonary vascularity grossly normal for technique.  Lungs appear emphysematous but clear.  No pleural effusion or pneumothorax.  Bones demineralized.  IMPRESSION:  No acute abnormalities.  Discharge Exam: Blood pressure 104/63, pulse 74, temperature 98.3 F (36.8 C), temperature source Oral, resp. rate 19, height 4\' 9"  (1.448 m), weight 48.2 kg (106 lb 4.2 oz), SpO2 97.00%. See PE from Progress note.  Disposition: Home or Self Care  Discharge Orders    Future Appointments: Provider: Department: Dept Phone: Center:   02/14/2011 10:00 AM [facility]  None   03/02/2011 1:00 PM [facility] [phone] [facility]   03/02/2011 1:30 PM Chuck Hint, MD [facility] [phone] [facility]     Future Orders Please Complete By Expires   Diet - low sodium heart healthy      Increase activity slowly      Discharge instructions      Scheduling Instructions:   Weigh daily. Call MD for weight gain over 3 lb in 24 hours or 5 lb in a week.     Current Discharge Medication List    CONTINUE these medications which have CHANGED   Details  levothyroxine (SYNTHROID, LEVOTHROID) 125 MCG tablet Take 1 tablet (125 mcg total) by mouth daily. Qty: 30 tablet, Refills: 11      CONTINUE these medications which have NOT CHANGED   Details  acetaminophen (TYLENOL) 500 MG tablet Take 500 mg by mouth as needed. For pain.     allopurinol (ZYLOPRIM) 100 MG tablet Take 100 mg by mouth daily.     atorvastatin (LIPITOR) 40 MG tablet Take 40 mg by mouth daily.  calcitRIOL (ROCALTROL) 0.25 MCG capsule Take 0.25 mcg by mouth daily.      carvedilol (COREG) 6.25 MG tablet Take 6.25 mg by mouth 2 (two) times daily with a meal.      dipyridamole-aspirin (AGGRENOX) 25-200 MG per 12 hr capsule Take 1 capsule by  mouth 2 (two) times daily.     furosemide (LASIX) 40 MG tablet Take 40 mg by mouth daily.      isosorbide mononitrate (IMDUR) 60 MG 24 hr tablet Take 60 mg by mouth daily.      labetalol (NORMODYNE) 100 MG tablet Take 50 mg by mouth 2 (two) times daily.      nitroGLYCERIN (NITROSTAT) 0.4 MG SL tablet Place 0.4 mg under the tongue every 5 (five) minutes as needed. For chest pain     pantoprazole (PROTONIX) 40 MG tablet Take 40 mg by mouth daily.      ranitidine (ZANTAC) 150 MG tablet Take 150 mg by mouth daily as needed. For heart burn.       STOP taking these medications     quiNINE (QUALAQUIN) 324 MG capsule          Signed: Laquitha Heslin W 02/09/2011, 4:30 PM

## 2011-02-09 NOTE — Progress Notes (Signed)
Pt. Discharged 02/09/2011  3:59 PM Discharge instructions reviewed with patient/family. Patient/family verbalized understanding. All Rx's given. Questions answered as needed. Pt. Discharged to home with family/self. Taken off unit via W/C. Marigene Ehlers, RN

## 2011-02-09 NOTE — Discharge Summary (Signed)
Agree with summary. 

## 2011-02-09 NOTE — Progress Notes (Addendum)
Patient ID: [name], female   DOB: [birth year], 77 y.o.   MRN: [id number]  [facility]  Subjective: Still with some SS chest wall tenderness.  Worse with inspiration.    Objective: Vital signs in last 24 hours: Temp:  [98.4 F (36.9 C)-100.4 F (38 C)] 100 F (37.8 C) (11/21 0500) Pulse Rate:  [60-80] 80  (11/21 0500) Resp:  [18-20] 19  (11/21 0500) BP: (128-142)/(62-77) 128/77 mmHg (11/21 0500) SpO2:  [93 %-97 %] 96 % (11/21 0500) Weight:  [48.2 kg (106 lb 4.2 oz)] 106 lb 4.2 oz (48.2 kg) (11/21 0500) Last BM Date: 02/06/11  Intake/Output from previous day: 11/20 0701 - 11/21 0700 In: 600 [P.O.:600] Out: 600 [Urine:600] Intake/Output this shift:    Medications Current Facility-Administered Medications  Medication Dose Route Frequency Provider Last Rate Last Dose  . 0.9 %  sodium chloride infusion  250 mL Intravenous Continuous Kerry Hough, PA 1 mL/hr at 02/08/11 0256 1,000 mL at 02/08/11 0256  . acetaminophen (TYLENOL) tablet 500 mg  500 mg Oral Q6H PRN Kerry Hough, PA      . albuterol (PROVENTIL) (5 MG/ML) 0.5% nebulizer solution 2.5 mg  2.5 mg Nebulization Q2H PRN Kerry Hough, PA      . carvedilol (COREG) tablet 6.25 mg  6.25 mg Oral BID WC Kerry Hough, PA   6.25 mg at 02/09/11 0630  . dipyridamole-aspirin (AGGRENOX) 25-200 MG per 12 hr capsule 1 capsule  1 capsule Oral BID Kerry Hough, PA   1 capsule at 02/08/11 2159  . enoxaparin (LOVENOX) injection 30 mg  30 mg Subcutaneous Daily Kerry Hough, PA   30 mg at 02/08/11 1015  . ferumoxytol (FERAHEME) injection 510 mg  510 mg Intravenous Once Trevor Iha, MD      . furosemide (LASIX) tablet 40 mg  40 mg Oral Daily Kerry Hough, PA   40 mg at 02/08/11 1017  . gi cocktail  30 mL Oral Once Leone Brand, NP   30 mL at 02/08/11 1016  . HYDROcodone-acetaminophen (NORCO) 5-325 MG per tablet 1-2 tablet  1-2 tablet Oral Q4H PRN Kerry Hough, PA      . levothyroxine  (SYNTHROID, LEVOTHROID) tablet 150 mcg  150 mcg Oral QAC breakfast Kerry Hough, PA   150 mcg at 02/09/11 0630  . morphine 2 MG/ML injection 1 mg  1 mg Intravenous Q2H PRN Kerry Hough, PA      . nitroGLYCERIN (NITROGLYN) 2 % ointment 1 inch  1 inch Topical Q8H Leone Brand, NP   1 inch at 02/09/11 0630  . nitroGLYCERIN (NITROSTAT) SL tablet 0.4 mg  0.4 mg Sublingual Q5 min PRN Kerry Hough, PA      . ondansetron [facility]) injection 4 mg  4 mg Intravenous Q6H PRN Kerry Hough, PA      . pantoprazole (PROTONIX) EC tablet 40 mg  40 mg Oral Q1200 Kerry Hough, PA   40 mg at 02/08/11 1209  . senna (SENOKOT) tablet 17.2 mg  2 tablet Oral Daily PRN Kerry Hough, PA      . simvastatin (ZOCOR) tablet 20 mg  20 mg Oral q1800 Kerry Hough, PA   20 mg at 02/08/11 1720  . sodium chloride 0.9 % injection 3 mL  3 mL Intravenous Q12H Kerry Hough, PA   3 mL at 02/08/11 2202  . DISCONTD: nitroGLYCERIN (NITROGLYN) 2 % ointment  1 inch  1 inch Topical Once Kerry Hough, PA        PE: General appearance: alert, cooperative and no distress Lungs: clear to auscultation bilaterally Heart: regular rate and rhythm and Slight MM LSB. Extremities: No LEE Pulses: 1+ DPs, 1+ Radials.  Lab Results:   [facility] 02/08/11 0220 02/07/11 1935  WBC 5.0 5.3  HGB 8.9* 9.3*  HCT 27.8* 29.4*  PLT 367 325   BMET  Basename 02/08/11 0220 02/07/11 1935  NA -- 140  K -- 4.4  CL -- 105  CO2 -- 23  GLUCOSE -- 87  BUN -- 40*  CREATININE 2.71* 2.76*  CALCIUM -- 9.9   PT/INR No results found for this basename: LABPROT:3,INR:3 in the last 72 hours Cholesterol  Basename 02/08/11 0547  CHOL 169   Cardiac Enzymes Negative times three.  Studies/Results: 2D-Echo: Left ventricle: The cavity size was normal. There was moderate concentric hypertrophy. Systolic function was moderately to severely reduced. The estimated ejection fraction was in the range of 30% to 35%. There is severe inferior,  inferoseptal and apical hypokinesis. There is incoordinate septal motion. Doppler parameters are consistent with abnormal left ventricular relaxation (grade 1 diastolic dysfunction). The E/e' ratio is >10, suggesting elevated LV filling pressure. - Left atrium: The atrium was at the upper limits of normal in size.  Assessment/Plan  Principal Problem:  Chest Pain Active Problems:   CKD (chronic kidney disease) stage 3, GFR 30-59 ml/min  CAD (coronary artery disease)  Cardiomyopathy, idiopathic  Hypothyroid   Anemia, likely secondary to CKD.  Receiving Iron inj.  Plan:  Cardiac enzymes negative x3.  Holding off on NSAID due to CKD.echocardiogram appears unchanged from 09/2010.  BP/HR stable and controlled.  Question DC today.   LOS: 2 days    HAGER,BRYAN W 02/09/2011 8:23 AM   Seen and examined. She thinks pain is "gas". Chest wall is still clearly tender. Recent extensive w/u was low risk. Risk of nephrotoxicity outweighs potential benefit of repeat cath. DC home. TSH is mildly suppressed. Reduce levothyroxine to 125 mcg daily.  [name] 3:27 PM

## 2011-02-14 ENCOUNTER — Encounter (HOSPITAL_COMMUNITY): Payer: Medicare Other

## 2011-02-15 ENCOUNTER — Encounter: Payer: Self-pay | Admitting: Vascular Surgery

## 2011-02-18 ENCOUNTER — Other Ambulatory Visit: Payer: Self-pay

## 2011-02-18 DIAGNOSIS — Z01810 Encounter for preprocedural cardiovascular examination: Secondary | ICD-10-CM

## 2011-02-18 DIAGNOSIS — N184 Chronic kidney disease, stage 4 (severe): Secondary | ICD-10-CM

## 2011-03-01 ENCOUNTER — Encounter: Payer: Self-pay | Admitting: Vascular Surgery

## 2011-03-02 ENCOUNTER — Ambulatory Visit: Payer: Medicare Other | Admitting: Vascular Surgery

## 2011-03-02 ENCOUNTER — Other Ambulatory Visit: Payer: Medicare Other

## 2011-03-24 ENCOUNTER — Emergency Department (HOSPITAL_COMMUNITY)
Admission: EM | Admit: 2011-03-24 | Discharge: 2011-03-24 | Disposition: A | Discharge status: Home / Self Care | Payer: Medicare Other | Source: Home / Self Care | Attending: Family Medicine | Admitting: Family Medicine

## 2011-03-24 ENCOUNTER — Encounter (HOSPITAL_COMMUNITY): Payer: Self-pay | Admitting: *Deleted

## 2011-03-24 DIAGNOSIS — G629 Polyneuropathy, unspecified: Secondary | ICD-10-CM

## 2011-03-24 DIAGNOSIS — G609 Hereditary and idiopathic neuropathy, unspecified: Secondary | ICD-10-CM

## 2011-03-24 MED ORDER — GABAPENTIN 300 MG PO CAPS: 300.0000 mg | ORAL_CAPSULE | Freq: Every day | ORAL | Status: DC

## 2011-03-24 NOTE — ED Notes (Signed)
Pt  Has  l  Foot  Pain        For  Quite  A  While  Worse  Recently  -  Pt  Has  History  Of  Gout  In  Past  She  Does  Report  The  Pain  Up  Her  l  Leg   As  Well  As  The  Foot  She  denys  Any  specefic  Recent  Injury

## 2011-03-24 NOTE — ED Provider Notes (Signed)
History     CSN: [id number]  Arrival date & time 03/24/11  1326   First MD Initiated Contact with Patient 03/24/11 1418      Chief Complaint  Patient presents with  . Foot Pain    (Consider location/radiation/quality/duration/timing/severity/associated sxs/prior treatment) HPI Comments: [name] presents for evaluation of worsening pain in her LEFT foot over the last month. It is red, very tender to touch. She denies any injury. She thinks that the pain started as she was hospitalized for chest pain in late November. She reports a hx of gout and thinks that her providers were treating this as gout but she is unsure what medication was prescribed.   Patient is a 78 y.o. female presenting with lower extremity pain. The history is provided by the patient and a relative.  Foot Pain This is a new problem. The current episode started more than 1 week ago. The problem occurs constantly. The problem has not changed since onset.The symptoms are aggravated by nothing. The symptoms are relieved by nothing.    Past Medical History  Diagnosis Date  . Coronary artery disease   . Myocardial infarct   . TIA (transient ischemic attack)   . Diabetes mellitus   . Hypertension   . Shingles   . Stroke   . Thyroid disease   . Angina   . Hypothyroidism   . GERD (gastroesophageal reflux disease)   . Anemia   . Hypothyroid 02/08/2011  . CAD (coronary artery disease) 02/08/2011  . Cardiomyopathy, idiopathic 02/08/2011  . Unstable angina 02/08/2011  . Chronic kidney disease     Past Surgical History  Procedure Date  . Cardiac catheterization     No family history on file.  History  Substance Use Topics  . Smoking status: Never Smoker   . Smokeless tobacco: Not on file  . Alcohol Use: No    OB History    Grav Para Term Preterm Abortions TAB SAB Ect Mult Living                  Review of Systems  Constitutional: Negative.   HENT: Negative.   Eyes: Negative.   Respiratory:  Negative.   Cardiovascular: Negative.   Gastrointestinal: Negative.   Genitourinary: Negative.   Musculoskeletal: Positive for myalgias and gait problem.  Skin: Positive for color change. Negative for rash and wound.  Neurological: Negative.  Negative for weakness and numbness.    Allergies  Aspirin  Home Medications   Current Outpatient Rx  Name Route Sig Dispense Refill  . ACETAMINOPHEN 500 MG PO TABS Oral Take 500 mg by mouth as needed. For pain.     Marland Kitchen ALLOPURINOL 100 MG PO TABS Oral Take 100 mg by mouth daily.     . ATORVASTATIN CALCIUM 40 MG PO TABS Oral Take 40 mg by mouth daily.      Marland Kitchen CALCITRIOL 0.25 MCG PO CAPS Oral Take 0.25 mcg by mouth daily.      Marland Kitchen CARVEDILOL 6.25 MG PO TABS Oral Take 6.25 mg by mouth 2 (two) times daily with a meal.      . ASPIRIN-DIPYRIDAMOLE 25-200 MG PO CP12 Oral Take 1 capsule by mouth 2 (two) times daily.     . FUROSEMIDE 40 MG PO TABS Oral Take 40 mg by mouth daily.      Marland Kitchen GABAPENTIN 300 MG PO CAPS Oral Take 1 capsule (300 mg total) by mouth at bedtime. 30 capsule 0  . ISOSORBIDE DINITRATE 30 MG PO TABS Oral  Take 60 mg by mouth daily.      . ISOSORBIDE MONONITRATE ER 60 MG PO TB24 Oral Take 60 mg by mouth daily.      Marland Kitchen LABETALOL HCL 100 MG PO TABS Oral Take 50 mg by mouth 2 (two) times daily.      Marland Kitchen LEVOTHYROXINE SODIUM 125 MCG PO TABS Oral Take 1 tablet (125 mcg total) by mouth daily. 30 tablet 11  . NITROGLYCERIN 0.4 MG SL SUBL Sublingual Place 0.4 mg under the tongue every 5 (five) minutes as needed. For chest pain     . PANTOPRAZOLE SODIUM 40 MG PO TBEC Oral Take 40 mg by mouth daily.      . QUININE SULFATE 324 MG PO CAPS Oral Take 648 mg by mouth every 8 (eight) hours.      Marland Kitchen RANITIDINE HCL 150 MG PO TABS Oral Take 150 mg by mouth daily as needed. For heart burn.       BP 173/70  Pulse 74  Temp(Src) 97.4 F (36.3 C) (Oral)  Resp 18  SpO2 95%  Physical Exam  Nursing note and vitals reviewed. Constitutional: She is oriented to person,  place, and time. She appears well-developed and well-nourished.  HENT:  Head: Normocephalic and atraumatic.  Eyes: EOM are normal.  Neck: Normal range of motion.  Pulmonary/Chest: Effort normal.  Musculoskeletal: Normal range of motion.       Left foot: She exhibits tenderness and swelling. She exhibits normal capillary refill.       Feet:  Neurological: She is alert and oriented to person, place, and time.  Skin: Skin is warm and dry.  Psychiatric: Her behavior is normal.    ED Course  Procedures (including critical care time)  Labs Reviewed - No data to display No results found.   1. Neuropathy, peripheral       MDM  Started gabapentin 300 mg QHS with PCP follow up to discuss long-term management        Richardo Priest, MD 03/24/11 1710

## 2011-04-19 ENCOUNTER — Encounter: Payer: Self-pay | Admitting: Vascular Surgery

## 2011-04-20 ENCOUNTER — Other Ambulatory Visit: Payer: Medicare Other

## 2011-04-20 ENCOUNTER — Ambulatory Visit: Payer: Medicare Other | Admitting: Vascular Surgery

## 2011-05-10 HISTORY — PX: OTHER SURGICAL HISTORY: SHX169

## 2011-05-17 ENCOUNTER — Encounter: Payer: Self-pay | Admitting: Vascular Surgery

## 2011-05-18 ENCOUNTER — Ambulatory Visit (INDEPENDENT_AMBULATORY_CARE_PROVIDER_SITE_OTHER): Payer: Medicare Other | Admitting: Vascular Surgery

## 2011-05-18 ENCOUNTER — Encounter (INDEPENDENT_AMBULATORY_CARE_PROVIDER_SITE_OTHER): Payer: Medicare Other | Admitting: *Deleted

## 2011-05-18 ENCOUNTER — Encounter: Payer: Self-pay | Admitting: Vascular Surgery

## 2011-05-18 VITALS — BP 118/57 | HR 72 | Resp 18 | Ht <= 58 in | Wt 113.0 lb

## 2011-05-18 DIAGNOSIS — N186 End stage renal disease: Secondary | ICD-10-CM

## 2011-05-18 DIAGNOSIS — N184 Chronic kidney disease, stage 4 (severe): Secondary | ICD-10-CM

## 2011-05-18 DIAGNOSIS — Z01810 Encounter for preprocedural cardiovascular examination: Secondary | ICD-10-CM

## 2011-05-18 NOTE — Progress Notes (Signed)
[facility] [place]  Patient name: [name] MRN: [id number] DOB: 05/28/1923 Sex: female  REASON FOR CONSULT: evaluate for hemodialysis access. Referred by Dr. Darrick Penna  HPI: [name] is a 78 y.o. female who is not yet on dialysis but was referred for access. She is right-handed. Of note she does have problems with her rotator cuff in the right shoulder. She has stage IV chronic kidney disease it is felt that she will ultimately require dialysis. She's had no recent uremic symptoms. Specifically she denies nausea, vomiting, fatigue, anorexia, or palpitations.  Past Medical History  Diagnosis Date  . Coronary artery disease   . Myocardial infarct   . TIA (transient ischemic attack)   . Diabetes mellitus   . Hypertension   . Shingles   . Stroke   . Thyroid disease   . Angina   . Hypothyroidism   . GERD (gastroesophageal reflux disease)   . Anemia   . Hypothyroid 02/08/2011  . CAD (coronary artery disease) 02/08/2011  . Cardiomyopathy, idiopathic 02/08/2011  . Unstable angina 02/08/2011  . Chronic kidney disease   . CHF (congestive heart failure)    She has an 80% lesion in the diagonal artery just proximal to the bifurcation and a 40% ostial stenosis. As was based on a heart cath done 10/05/2010.  Family History  Problem Relation Age of Onset  . Cancer Sister     STOMACH  . Cancer Brother     BONE    SOCIAL HISTORY: History  Substance Use Topics  . Smoking status: Never Smoker   . Smokeless tobacco: Never Used  . Alcohol Use: No    Allergies  Allergen Reactions  . Aspirin Nausea And Vomiting    [name]     Current Outpatient Prescriptions  Medication Sig Dispense Refill  . acetaminophen (TYLENOL) 500 MG tablet Take 500 mg by mouth as needed. For pain.       Marland Kitchen allopurinol (ZYLOPRIM) 100 MG tablet Take 100 mg by mouth daily.       Marland Kitchen atorvastatin (LIPITOR) 40 MG tablet  Take 40 mg by mouth daily.        . calcitRIOL (ROCALTROL) 0.25 MCG capsule Take 0.25 mcg by mouth 2 (two) times daily.       . carvedilol (COREG) 6.25 MG tablet Take 6.25 mg by mouth 2 (two) times daily with a meal.        . dipyridamole-aspirin (AGGRENOX) 25-200 MG per 12 hr capsule Take 1 capsule by mouth 2 (two) times daily.       . furosemide (LASIX) 40 MG tablet Take 40 mg by mouth 2 (two) times daily.       Marland Kitchen gabapentin (NEURONTIN) 300 MG capsule Take 300 mg by mouth 2 (two) times daily.      . isosorbide mononitrate (IMDUR) 60 MG 24 hr tablet Take 60 mg by mouth daily.        Marland Kitchen levothyroxine (SYNTHROID, LEVOTHROID) 125 MCG tablet Take 1 tablet (125 mcg total) by mouth daily.  30 tablet  11  . nitroGLYCERIN (NITROSTAT) 0.4 MG SL tablet Place 0.4 mg under the tongue every 5 (five) minutes as needed. For chest pain       . pantoprazole (PROTONIX) 40 MG tablet Take 40 mg by mouth daily.        . ranitidine (ZANTAC) 150 MG tablet Take 150 mg by mouth daily as needed.  For heart burn.       . isosorbide dinitrate (ISORDIL) 30 MG tablet Take 60 mg by mouth daily.        Marland Kitchen labetalol (NORMODYNE) 100 MG tablet Take 50 mg by mouth 2 (two) times daily.        . quiNINE (QUALAQUIN) 324 MG capsule Take 648 mg by mouth every 8 (eight) hours.          REVIEW OF SYSTEMS: [name] ] denotes positive finding; [  ] denotes negative finding  CARDIOVASCULAR:  [ ]  chest pain   [ ]  chest pressure   [ ]  palpitations   [ ]  orthopnea   [ ]  dyspnea on exertion   [name] ] claudication   [ ]  rest pain   [ ]  DVT   [ ]  phlebitis PULMONARY:   [ ]  productive cough   [ ]  asthma   [ ]  wheezing NEUROLOGIC:   [name]  ] weakness bilateral [ ]  paresthesias  [ ]  aphasia  [ ]  amaurosis  [ ]  dizziness HEMATOLOGIC:   [ ]  bleeding problems   [ ]  clotting disorders MUSCULOSKELETAL:  [ ]  joint pain   [ ]  joint swelling [name] ] leg swelling GASTROINTESTINAL: [ ]   blood in stool  [ ]   hematemesis GENITOURINARY:  [ ]   dysuria  [ ]    hematuria PSYCHIATRIC:  [ ]  history of major depression INTEGUMENTARY:  [ ]  rashes  [ ]  ulcers CONSTITUTIONAL:  [ ]  fever   [ ]  chills  PHYSICAL EXAM: Filed Vitals:   05/18/11 1334  BP: 118/57  Pulse: 72  Resp: 18  Height: 4\' 9"  (1.448 m)  Weight: 113 lb (51.256 kg)   Body mass index is 24.45 kg/(m^2). GENERAL: The patient is a well-nourished female, in no acute distress. The vital signs are documented above. CARDIOVASCULAR: There is a regular rate and rhythm without significant murmur appreciated. I do not detect carotid bruits. She has palpable brachial and radial pulses bilaterally. Her left radial pulse is slightly diminished. He has no significant lower extremity swelling PULMONARY: There is good air exchange bilaterally without wheezing or rales. ABDOMEN: Soft and non-tender with normal pitched bowel sounds.  MUSCULOSKELETAL: There are no major deformities or cyanosis. NEUROLOGIC: No focal weakness or paresthesias are detected. SKIN: There are no ulcers or rashes noted. PSYCHIATRIC: The patient has a normal affect.  DATA:   Lab Results  Component Value Date   CREATININE 2.71* 02/08/2011    Lab Results  Component Value Date   HGBA1C 6.9* 02/08/2011   I have reviewed her encouraged that were sent from Dr. Deterding's office. She has coronary artery disease which is been stable. She also has hypothyroidism. She was last seen in November 2012 her volume status looked good.  I have independently interpreted her vein mapping which was done in our office today. This shows that the upper arm cephalic vein on the left is somewhat small but baby potentially usable for a fistula. The basilic vein looks to be reasonable size on the left side and she could potentially have a basilic vein transposition. The forearm cephalic vein on the right and basilic vein in the upper arm on the right appear reasonable.  MEDICAL ISSUES: I've recommended that we explore her left upper arm cephalic  vein. This is adequate she did have a brachiocephalic AV fistula. If this is not adequate I would recommend a basilic vein transposition on the left. Because of her rotator cuff problems in the right  arm I would not favor a right arm fistula this time. I have explained the indications for placement of an AV fistula or AV graft. I've explained that if at all possible we will place an AV fistula.  I have reviewed the risks of placement of an AV fistula including but not limited to: failure of the fistula to mature, need for subsequent interventions, and thrombosis. In addition I have reviewed the potential complications of placement of an AV graft. These risks include, but are not limited to, graft thrombosis, graft infection, wound healing problems, bleeding, arm swelling, and steal syndrome. All the patient's questions were answered and they are agreeable to proceed with surgery. She states that she will check her schedule and call to arrange placement of a left arm AV fistula. She has multiple appointments upcoming and has to work around the schedule.   Nehemiah Mcfarren S [facility] [place] Beeper: [phone]

## 2011-05-19 ENCOUNTER — Other Ambulatory Visit: Payer: Self-pay

## 2011-05-23 ENCOUNTER — Encounter (HOSPITAL_COMMUNITY): Payer: Self-pay | Admitting: Pharmacy Technician

## 2011-05-25 ENCOUNTER — Encounter (HOSPITAL_COMMUNITY)
Admission: RE | Admit: 2011-05-25 | Discharge: 2011-05-25 | Disposition: A | Discharge status: Home / Self Care | Payer: Medicare Other | Source: Ambulatory Visit | Attending: Vascular Surgery | Admitting: Vascular Surgery

## 2011-05-25 ENCOUNTER — Encounter (HOSPITAL_COMMUNITY): Payer: Self-pay

## 2011-05-25 HISTORY — DX: Personal history of transient ischemic attack (TIA), and cerebral infarction without residual deficits: Z86.73

## 2011-05-25 HISTORY — DX: Other amnesia: R41.3

## 2011-05-25 HISTORY — DX: Reserved for concepts with insufficient information to code with codable children: IMO0002

## 2011-05-25 HISTORY — DX: Cardiac arrhythmia, unspecified: I49.9

## 2011-05-25 LAB — CBC
MCH: 31.2 pg (ref 26.0–34.0)
MCHC: 32.4 g/dL (ref 30.0–36.0)
MCV: 96.3 fL (ref 78.0–100.0)
Platelets: 269 10*3/uL (ref 150–400)
RBC: 3.49 MIL/uL — ABNORMAL LOW (ref 3.87–5.11)
RDW: 13.8 % (ref 11.5–15.5)

## 2011-05-25 LAB — BASIC METABOLIC PANEL
Calcium: 9.5 mg/dL (ref 8.4–10.5)
Creatinine, Ser: 1.82 mg/dL — ABNORMAL HIGH (ref 0.50–1.10)
GFR calc non Af Amer: 24 mL/min — ABNORMAL LOW (ref >90)
Glucose, Bld: 194 mg/dL — ABNORMAL HIGH (ref 70–99)
Sodium: 133 mEq/L — ABNORMAL LOW (ref 135–145)

## 2011-05-25 LAB — SURGICAL PCR SCREEN: MRSA, PCR: NEGATIVE

## 2011-05-25 NOTE — Pre-Procedure Instructions (Signed)
20 [facility] HEYING  05/25/2011   Your procedure is scheduled on:  Friday, March 8  Report to [facility] at 7:30 AM.  Call this number if you have problems the morning of surgery: [phone]   Remember:   Do not eat food:After Midnight.  May have clear liquids: up to 4 Hours before arrival.  Clear liquids include soda, tea, black coffee, apple or grape juice, broth.  Take these medicines the morning of surgery with A SIP OF WATER: Synthroid, Allopurinol, Coreg, Neurontin, Imdur, Protonix, bring Nitroglycerin day of surgery   Do not wear jewelry, make-up or nail polish.  Do not wear lotions, powders, or perfumes. You may wear deodorant.  Do not shave 48 hours prior to surgery.  Do not bring valuables to the hospital.  Contacts, dentures or bridgework may not be worn into surgery.  Leave suitcase in the car. After surgery it may be brought to your room.  For patients admitted to the hospital, checkout time is 11:00 AM the day of discharge.   Patients discharged the day of surgery will not be allowed to drive home.  Name and phone number of your driver:   Special Instructions: CHG Shower Use Special Wash: 1/2 bottle night before surgery and 1/2 bottle morning of surgery.   Please read over the following fact sheets that you were given: Pain Booklet, Coughing and Deep Breathing and Surgical Site Infection Prevention

## 2011-05-25 NOTE — Procedures (Unsigned)
CEPHALIC VEIN MAPPING  INDICATION:  Preoperative vein mapping for dialysis access placement.  HISTORY: Diabetes:  Yes. Hypertension:  Yes. Coronary artery disease:  Yes. Chronic kidney disease:  Stage 4-5.  EXAM:  The right cephalic vein is patent with diameter measurements ranging from 0.26 to 0.13 cm.  The right basilic vein is compressible with diameter measurements ranging from 0.37 to 0.23 cm.  The left cephalic vein is compressible with diameter measurements ranging from 0.25 to 0.11 cm.  The left basilic vein is compressible with diameter measurements ranging from 0.46 to 0.30 cm.  IMPRESSION:  Patent right and left cephalic and basilic veins with diameter measurements shown on the following worksheet.  ___________________________________________ Di Kindle. Edilia Bo, M.D.  EM/MEDQ  D:  05/18/2011  T:  05/18/2011  Job:  [id number]

## 2011-05-26 MED ORDER — CEFAZOLIN SODIUM 1-5 GM-% IV SOLN
1.0000 g | Freq: Once | INTRAVENOUS | Status: AC
  Administered 2011-05-27: 1 g via INTRAVENOUS

## 2011-05-26 NOTE — Consult Note (Signed)
Anesthesia:  Patient is a 78 year old female scheduled for a LUE AVF on 05/27/11.  She is not yet on HD.  History includes CAD/MI, CHF with mixed ischemic and nonischemic CM, TIA, DM2, HTN, CVA, hypothyroidism, GERD, memory loss, gout, non-smoker.  Her primary Cardiologist is Dr. Rennis Golden [facility]).  She was hospitalized in November 2011 with atypical chest pain and found to be in CHF with new EF 30-40% and global hypokinesis.  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  He saw her in follow-up on 03/09/11 and a repeat echo was done on 04/25/11 and showed: Mild LVH, EF now 50-55%, low normal LV systolic function, impaired LV relaxation, no regional wall abnormalities, trace MR, mild to mod mitral annular calcification, mild TR, RV systolic pressure 30-40 mmHg, AV moderate sclerosis without stenosis.  Of note, she did have an abnormal stress test on 06/01/09 that showed moderate perfusion defect due to infarct/scar and mid peri-infarct ischemia in the inferiolateral position.  She had had a cath in 2009 that showed mild to moderate RCA stenosis so it was felt she had most likely experienced a ruptured plaque. She was asymptomatic and a decision to treat her medically was made at that time.  EKG from [facility] on 05/20/11 showed NSR, lateral T wave abnormality which was been present since at least March 2011.    Labs noted.  Cr elevated as expected.  K is 5.3.  She will get an ISTAT on the DOS.  Glucose 194.  H/H 12/28/31.6  CXR from 02/07/11 showed no acute abnormalities.  If ISTAT results reasonable, and there is no significant change in her status, then plan to proceed.

## 2011-05-27 ENCOUNTER — Ambulatory Visit (HOSPITAL_COMMUNITY)
Admission: RE | Admit: 2011-05-27 | Discharge: 2011-05-27 | Disposition: A | Discharge status: Home / Self Care | Payer: Medicare Other | Source: Ambulatory Visit | Attending: Vascular Surgery | Admitting: Vascular Surgery

## 2011-05-27 ENCOUNTER — Encounter (HOSPITAL_COMMUNITY)
Admission: RE | Disposition: A | Discharge status: Home / Self Care | Payer: Self-pay | Source: Ambulatory Visit | Attending: Vascular Surgery

## 2011-05-27 ENCOUNTER — Ambulatory Visit (HOSPITAL_COMMUNITY): Payer: Medicare Other | Admitting: Vascular Surgery

## 2011-05-27 ENCOUNTER — Encounter (HOSPITAL_COMMUNITY): Payer: Self-pay | Admitting: Vascular Surgery

## 2011-05-27 ENCOUNTER — Encounter (HOSPITAL_COMMUNITY): Payer: Self-pay | Admitting: *Deleted

## 2011-05-27 DIAGNOSIS — N186 End stage renal disease: Secondary | ICD-10-CM

## 2011-05-27 DIAGNOSIS — I129 Hypertensive chronic kidney disease with stage 1 through stage 4 chronic kidney disease, or unspecified chronic kidney disease: Secondary | ICD-10-CM | POA: Insufficient documentation

## 2011-05-27 DIAGNOSIS — K219 Gastro-esophageal reflux disease without esophagitis: Secondary | ICD-10-CM | POA: Insufficient documentation

## 2011-05-27 DIAGNOSIS — I2510 Atherosclerotic heart disease of native coronary artery without angina pectoris: Secondary | ICD-10-CM | POA: Insufficient documentation

## 2011-05-27 DIAGNOSIS — N184 Chronic kidney disease, stage 4 (severe): Secondary | ICD-10-CM | POA: Insufficient documentation

## 2011-05-27 DIAGNOSIS — Z01812 Encounter for preprocedural laboratory examination: Secondary | ICD-10-CM | POA: Insufficient documentation

## 2011-05-27 DIAGNOSIS — Z8673 Personal history of transient ischemic attack (TIA), and cerebral infarction without residual deficits: Secondary | ICD-10-CM | POA: Insufficient documentation

## 2011-05-27 DIAGNOSIS — E119 Type 2 diabetes mellitus without complications: Secondary | ICD-10-CM | POA: Insufficient documentation

## 2011-05-27 SURGERY — TRANSPOSITION, VEIN, BASILIC
Anesthesia: Monitor Anesthesia Care | Site: Arm Upper | Laterality: Left | Wound class: Clean

## 2011-05-27 MED ORDER — PROPOFOL 10 MG/ML IV EMUL
INTRAVENOUS | Status: DC | PRN
  Administered 2011-05-27: 25 ug/kg/min via INTRAVENOUS

## 2011-05-27 MED ORDER — PROTAMINE SULFATE 10 MG/ML IV SOLN
INTRAVENOUS | Status: DC | PRN
  Administered 2011-05-27: 20 mg via INTRAVENOUS

## 2011-05-27 MED ORDER — SODIUM CHLORIDE 0.9 % IV SOLN
INTRAVENOUS | Status: DC
  Administered 2011-05-27: 10:00:00 via INTRAVENOUS

## 2011-05-27 MED ORDER — SODIUM CHLORIDE 0.9 % IR SOLN
Status: DC | PRN
  Administered 2011-05-27: 11:00:00

## 2011-05-27 MED ORDER — FENTANYL CITRATE 0.05 MG/ML IJ SOLN
INTRAMUSCULAR | Status: DC | PRN
  Administered 2011-05-27: 50 ug via INTRAVENOUS
  Administered 2011-05-27: 25 ug via INTRAVENOUS

## 2011-05-27 MED ORDER — ONDANSETRON HCL 4 MG/2ML IJ SOLN: 4.0000 mg | Freq: Once | INTRAMUSCULAR | Status: DC | PRN

## 2011-05-27 MED ORDER — OXYCODONE HCL 5 MG PO TABS: 5.0000 mg | ORAL_TABLET | ORAL | Status: AC | PRN

## 2011-05-27 MED ORDER — MORPHINE SULFATE 2 MG/ML IJ SOLN: 0.0500 mg/kg | INTRAMUSCULAR | Status: DC | PRN

## 2011-05-27 MED ORDER — LIDOCAINE HCL (PF) 1 % IJ SOLN
INTRAMUSCULAR | Status: DC | PRN
  Administered 2011-05-27: 30 mL
  Administered 2011-05-27: 14 mL

## 2011-05-27 MED ORDER — PHENYLEPHRINE HCL 10 MG/ML IJ SOLN
INTRAMUSCULAR | Status: DC | PRN
  Administered 2011-05-27: 80 ug via INTRAVENOUS

## 2011-05-27 MED ORDER — 0.9 % SODIUM CHLORIDE (POUR BTL) OPTIME
TOPICAL | Status: DC | PRN
  Administered 2011-05-27: 1000 mL

## 2011-05-27 MED ORDER — CEFAZOLIN SODIUM 1-5 GM-% IV SOLN
INTRAVENOUS | Status: AC
  Filled 2011-05-27: qty 50

## 2011-05-27 MED ORDER — HEPARIN SODIUM (PORCINE) 1000 UNIT/ML IJ SOLN
INTRAMUSCULAR | Status: DC | PRN
  Administered 2011-05-27: 4000 [IU] via INTRAVENOUS

## 2011-05-27 MED ORDER — HYDROMORPHONE HCL PF 1 MG/ML IJ SOLN: 0.2500 mg | INTRAMUSCULAR | Status: DC | PRN

## 2011-05-27 SURGICAL SUPPLY — 46 items
ADH SKN CLS APL DERMABOND .7 (GAUZE/BANDAGES/DRESSINGS) ×2
ADH SKN CLS LQ APL DERMABOND (GAUZE/BANDAGES/DRESSINGS) ×2
CANISTER SUCTION 2500CC (MISCELLANEOUS) ×3 IMPLANT
CLIP TI MEDIUM 6 (CLIP) ×3 IMPLANT
CLIP TI WIDE RED SMALL 24 (CLIP) ×2 IMPLANT
CLIP TI WIDE RED SMALL 6 (CLIP) ×3 IMPLANT
CLOTH BEACON ORANGE TIMEOUT ST (SAFETY) ×3 IMPLANT
COVER PROBE W GEL 5X96 (DRAPES) ×3 IMPLANT
COVER SURGICAL LIGHT HANDLE (MISCELLANEOUS) ×6 IMPLANT
DECANTER SPIKE VIAL GLASS SM (MISCELLANEOUS) ×3 IMPLANT
DERMABOND ADHESIVE PROPEN (GAUZE/BANDAGES/DRESSINGS) ×1
DERMABOND ADVANCED (GAUZE/BANDAGES/DRESSINGS) ×1
DERMABOND ADVANCED .7 DNX12 (GAUZE/BANDAGES/DRESSINGS) ×2 IMPLANT
DERMABOND ADVANCED .7 DNX6 (GAUZE/BANDAGES/DRESSINGS) ×1 IMPLANT
DRAIN PENROSE 1/2X12 LTX STRL (WOUND CARE) IMPLANT
ELECT REM PT RETURN 9FT ADLT (ELECTROSURGICAL) ×3
ELECTRODE REM PT RTRN 9FT ADLT (ELECTROSURGICAL) ×2 IMPLANT
GLOVE BIO SURGEON STRL SZ7.5 (GLOVE) ×3 IMPLANT
GLOVE BIOGEL PI IND STRL 7.0 (GLOVE) ×1 IMPLANT
GLOVE BIOGEL PI IND STRL 7.5 (GLOVE) ×4 IMPLANT
GLOVE BIOGEL PI INDICATOR 7.0 (GLOVE) ×1
GLOVE BIOGEL PI INDICATOR 7.5 (GLOVE) ×3
GLOVE SS BIOGEL STRL SZ 7 (GLOVE) ×1 IMPLANT
GLOVE SUPERSENSE BIOGEL SZ 7 (GLOVE) ×1
GLOVE SURG SS PI 7.5 STRL IVOR (GLOVE) ×2 IMPLANT
GOWN PREVENTION PLUS XLARGE (GOWN DISPOSABLE) ×2 IMPLANT
GOWN STRL NON-REIN LRG LVL3 (GOWN DISPOSABLE) ×6 IMPLANT
KIT BASIN OR (CUSTOM PROCEDURE TRAY) ×3 IMPLANT
KIT ROOM TURNOVER OR (KITS) ×3 IMPLANT
NS IRRIG 1000ML POUR BTL (IV SOLUTION) ×3 IMPLANT
PACK CV ACCESS (CUSTOM PROCEDURE TRAY) ×3 IMPLANT
PAD ARMBOARD 7.5X6 YLW CONV (MISCELLANEOUS) ×6 IMPLANT
SPONGE GAUZE 4X4 12PLY (GAUZE/BANDAGES/DRESSINGS) ×3 IMPLANT
SPONGE SURGIFOAM ABS GEL 100 (HEMOSTASIS) IMPLANT
SURGILUBE 3G PEEL PACK STRL (MISCELLANEOUS) ×2 IMPLANT
SUT PROLENE 6 0 BV (SUTURE) ×3 IMPLANT
SUT SILK 2 0 FS (SUTURE) ×2 IMPLANT
SUT SILK 3 0 (SUTURE) ×3
SUT SILK 3-0 18XBRD TIE 12 (SUTURE) ×1 IMPLANT
SUT VIC AB 3-0 SH 27 (SUTURE) ×6
SUT VIC AB 3-0 SH 27X BRD (SUTURE) ×3 IMPLANT
SUT VICRYL 4-0 PS2 18IN ABS (SUTURE) ×7 IMPLANT
TOWEL OR 17X24 6PK STRL BLUE (TOWEL DISPOSABLE) ×3 IMPLANT
TOWEL OR 17X26 10 PK STRL BLUE (TOWEL DISPOSABLE) ×3 IMPLANT
UNDERPAD 30X30 INCONTINENT (UNDERPADS AND DIAPERS) ×3 IMPLANT
WATER STERILE IRR 1000ML POUR (IV SOLUTION) ×3 IMPLANT

## 2011-05-27 NOTE — H&P (View-Only) (Signed)
[facility] Specialist of [place]  Patient name: [name] MRN: [id number] DOB: 11/24/1923 Sex: female  REASON FOR CONSULT: evaluate for hemodialysis access. Referred by Dr. Deterding  HPI: [name] is a 78 y.o. female who is not yet on dialysis but was referred for access. She is right-handed. Of note she does have problems with her rotator cuff in the right shoulder. She has stage IV chronic kidney disease it is felt that she will ultimately require dialysis. She's had no recent uremic symptoms. Specifically she denies nausea, vomiting, fatigue, anorexia, or palpitations.  Past Medical History  Diagnosis Date  . Coronary artery disease   . Myocardial infarct   . TIA (transient ischemic attack)   . Diabetes mellitus   . Hypertension   . Shingles   . Stroke   . Thyroid disease   . Angina   . Hypothyroidism   . GERD (gastroesophageal reflux disease)   . Anemia   . Hypothyroid 02/08/2011  . CAD (coronary artery disease) 02/08/2011  . Cardiomyopathy, idiopathic 02/08/2011  . Unstable angina 02/08/2011  . Chronic kidney disease   . CHF (congestive heart failure)    She has an 80% lesion in the diagonal artery just proximal to the bifurcation and a 40% ostial stenosis. As was based on a heart cath done 10/05/2010.  Family History  Problem Relation Age of Onset  . Cancer Sister     STOMACH  . Cancer Brother     BONE    SOCIAL HISTORY: History  Substance Use Topics  . Smoking status: Never Smoker   . Smokeless tobacco: Never Used  . Alcohol Use: No    Allergies  Allergen Reactions  . Aspirin Nausea And Vomiting    [name]     Current Outpatient Prescriptions  Medication Sig Dispense Refill  . acetaminophen (TYLENOL) 500 MG tablet Take 500 mg by mouth as needed. For pain.       . allopurinol (ZYLOPRIM) 100 MG tablet Take 100 mg by mouth daily.       . atorvastatin (LIPITOR) 40 MG tablet  Take 40 mg by mouth daily.        . calcitRIOL (ROCALTROL) 0.25 MCG capsule Take 0.25 mcg by mouth 2 (two) times daily.       . carvedilol (COREG) 6.25 MG tablet Take 6.25 mg by mouth 2 (two) times daily with a meal.        . dipyridamole-aspirin (AGGRENOX) 25-200 MG per 12 hr capsule Take 1 capsule by mouth 2 (two) times daily.       . furosemide (LASIX) 40 MG tablet Take 40 mg by mouth 2 (two) times daily.       . gabapentin (NEURONTIN) 300 MG capsule Take 300 mg by mouth 2 (two) times daily.      . isosorbide mononitrate (IMDUR) 60 MG 24 hr tablet Take 60 mg by mouth daily.        . levothyroxine (SYNTHROID, LEVOTHROID) 125 MCG tablet Take 1 tablet (125 mcg total) by mouth daily.  30 tablet  11  . nitroGLYCERIN (NITROSTAT) 0.4 MG SL tablet Place 0.4 mg under the tongue every 5 (five) minutes as needed. For chest pain       . pantoprazole (PROTONIX) 40 MG tablet Take 40 mg by mouth daily.        . ranitidine (ZANTAC) 150 MG tablet Take 150 mg by mouth daily as needed.   For heart burn.       . isosorbide dinitrate (ISORDIL) 30 MG tablet Take 60 mg by mouth daily.        . labetalol (NORMODYNE) 100 MG tablet Take 50 mg by mouth 2 (two) times daily.        . quiNINE (QUALAQUIN) 324 MG capsule Take 648 mg by mouth every 8 (eight) hours.          REVIEW OF SYSTEMS: [X ] denotes positive finding; [  ] denotes negative finding  CARDIOVASCULAR:  [ ] chest pain   [ ] chest pressure   [ ] palpitations   [ ] orthopnea   [ ] dyspnea on exertion   [X ] claudication   [ ] rest pain   [ ] DVT   [ ] phlebitis PULMONARY:   [ ] productive cough   [ ] asthma   [ ] wheezing NEUROLOGIC:   [X  ] weakness bilateral [ ] paresthesias  [ ] aphasia  [ ] amaurosis  [ ] dizziness HEMATOLOGIC:   [ ] bleeding problems   [ ] clotting disorders MUSCULOSKELETAL:  [ ] joint pain   [ ] joint swelling [X ] leg swelling GASTROINTESTINAL: [ ]  blood in stool  [ ]  hematemesis GENITOURINARY:  [ ]  dysuria  [ ]   hematuria PSYCHIATRIC:  [ ] history of major depression INTEGUMENTARY:  [ ] rashes  [ ] ulcers CONSTITUTIONAL:  [ ] fever   [ ] chills  PHYSICAL EXAM: Filed Vitals:   05/18/11 1334  BP: 118/57  Pulse: 72  Resp: 18  Height: 4' 9" (1.448 m)  Weight: 113 lb (51.256 kg)   Body mass index is 24.45 kg/(m^2). GENERAL: The patient is a well-nourished female, in no acute distress. The vital signs are documented above. CARDIOVASCULAR: There is a regular rate and rhythm without significant murmur appreciated. I do not detect carotid bruits. She has palpable brachial and radial pulses bilaterally. Her left radial pulse is slightly diminished. He has no significant lower extremity swelling PULMONARY: There is good air exchange bilaterally without wheezing or rales. ABDOMEN: Soft and non-tender with normal pitched bowel sounds.  MUSCULOSKELETAL: There are no major deformities or cyanosis. NEUROLOGIC: No focal weakness or paresthesias are detected. SKIN: There are no ulcers or rashes noted. PSYCHIATRIC: The patient has a normal affect.  DATA:   Lab Results  Component Value Date   CREATININE 2.71* 02/08/2011    Lab Results  Component Value Date   HGBA1C 6.9* 02/08/2011   I have reviewed her encouraged that were sent from Dr. Deterding's office. She has coronary artery disease which is been stable. She also has hypothyroidism. She was last seen in November 2012 her volume status looked good.  I have independently interpreted her vein mapping which was done in our office today. This shows that the upper arm cephalic vein on the left is somewhat small but baby potentially usable for a fistula. The basilic vein looks to be reasonable size on the left side and she could potentially have a basilic vein transposition. The forearm cephalic vein on the right and basilic vein in the upper arm on the right appear reasonable.  MEDICAL ISSUES: I've recommended that we explore her left upper arm cephalic  vein. This is adequate she did have a brachiocephalic AV fistula. If this is not adequate I would recommend a basilic vein transposition on the left. Because of her rotator cuff problems in the right   arm I would not favor a right arm fistula this time. I have explained the indications for placement of an AV fistula or AV graft. I've explained that if at all possible we will place an AV fistula.  I have reviewed the risks of placement of an AV fistula including but not limited to: failure of the fistula to mature, need for subsequent interventions, and thrombosis. In addition I have reviewed the potential complications of placement of an AV graft. These risks include, but are not limited to, graft thrombosis, graft infection, wound healing problems, bleeding, arm swelling, and steal syndrome. All the patient's questions were answered and they are agreeable to proceed with surgery. She states that she will check her schedule and call to arrange placement of a left arm AV fistula. She has multiple appointments upcoming and has to work around the schedule.   Caden Fukushima S [facility] [place] Beeper: [phone]    

## 2011-05-27 NOTE — Op Note (Signed)
NAME: [name]   MRN: [id number] DOB: 04-03-1923    DATE OF OPERATION: 05/27/2011  PREOP DIAGNOSIS: Chronic kidney disease  POSTOP DIAGNOSIS: St.  PROCEDURE: Left basilic vein transposition  SURGEON: Di Kindle. Edilia Bo, MD, FACS  ASSIST: Lianne Cure PA  ANESTHESIA: local with sedation   EBL: minimal  INDICATIONS: [name] is a 78 y.o. female with chronic kidney disease. She is not yet on dialysis. We were asked to place access.  FINDINGS: the cephalic vein was very small and I did not think this was usable for a brachiocephalic AV fistula. The basilic vein was reasonable in size. She has a high bifurcation of her brachial artery and the anastomosis was to the radial artery.  TECHNIQUE: The patient was brought to the operating room and sedated by anesthesia. The left upper extremity was prepped and draped in the usual sterile fashion. After the skin was anesthetized with 1% lidocaine, the basilic vein was harvested from the antecubital level to the axilla through 3 incisions along the medial aspect of the left upper arm. The vein was somewhat small in size but reasonable. Through the distal incision the artery was dissected free and was very small. Upon interrogation with the Doppler it was clear that the patient had a high bifurcation of the brachial artery.  I identified the radial and ulnar arteries and both were about the same size. The ulnar branch was significantly deeper. The basilic vein was ligated distally and then irrigated out with heparinized saline. It was then tunneled in the lateral aspect of the upper arm and the patient was heparinized. The radial artery was clamped proximally and distally and a longitudinal arteriotomy was made. The vein was sewn end-to-side to the artery using continuous 6-0 Prolene suture. At the completion there was a palpable thrill in the fistula. Hemostasis was obtained in the wounds. Each of the wounds was closed with deep layer 3-0  Vicryl a subcutaneous layer 3-0 Vicryl and the skin closed with 4-0 subcuticular stitch. The completion was a good radial signal with the Doppler. Dermabond was applied to the incisions. Patient tolerated the procedure well and transferred to the recovery room in stable condition. All needle and sponge counts were correct.  Waverly Ferrari, MD, FACS [facility]  DATE OF DICTATION:   05/27/2011

## 2011-05-27 NOTE — Anesthesia Postprocedure Evaluation (Signed)
Anesthesia Post Note  Patient: [name]  Procedure(s) Performed: Procedure(s) (LRB): BASCILIC VEIN TRANSPOSITION (Left)  Anesthesia type: general  Patient location: PACU  Post pain: Pain level controlled  Post assessment: Patient's Cardiovascular Status Stable  Last Vitals:  Filed Vitals:   05/27/11 1357  BP:   Pulse:   Temp: 36.6 C  Resp:     Post vital signs: Reviewed and stable  Level of consciousness: sedated  Complications: No apparent anesthesia complications

## 2011-05-27 NOTE — Transfer of Care (Signed)
Immediate Anesthesia Transfer of Care Note  Patient: [name]  Procedure(s) Performed: Procedure(s) (LRB): BASCILIC VEIN TRANSPOSITION (Left)  Patient Location: PACU  Anesthesia Type: MAC  Level of Consciousness: awake, alert  and oriented  Airway & Oxygen Therapy: Patient Spontanous Breathing and Patient connected to nasal cannula oxygen  Post-op Assessment: Report given to PACU RN and Post -op Vital signs reviewed and stable  Post vital signs: Reviewed and stable  Complications: No apparent anesthesia complications

## 2011-05-27 NOTE — Anesthesia Procedure Notes (Signed)
Procedure Name: MAC Performed by: Rosita Fire Oxygen Delivery Method: Simple face mask

## 2011-05-27 NOTE — Anesthesia Preprocedure Evaluation (Addendum)
Anesthesia Evaluation  Patient identified by MRN, date of birth, ID band Patient awake    Reviewed: Allergy & Precautions, H&P , NPO status , Patient's Chart, lab work & pertinent test results  Airway Mallampati: II      Dental  (+) Edentulous Upper and Edentulous Lower   Pulmonary          Cardiovascular hypertension, Pt. on medications and Pt. on home beta blockers + angina with exertion + CAD, + Past MI and +CHF     Neuro/Psych TIACVA, No Residual Symptoms    GI/Hepatic   Endo/Other  Diabetes mellitus-, Type 2Hypothyroidism   Renal/GU      Musculoskeletal   Abdominal   Peds  Hematology   Anesthesia Other Findings   Reproductive/Obstetrics                           Anesthesia Physical Anesthesia Plan  ASA: III  Anesthesia Plan: MAC   Post-op Pain Management:    Induction: Intravenous  Airway Management Planned: Simple Face Mask  Additional Equipment:   Intra-op Plan:   Post-operative Plan:   Informed Consent: I have reviewed the patients History and Physical, chart, labs and discussed the procedure including the risks, benefits and alternatives for the proposed anesthesia with the patient or authorized representative who has indicated his/her understanding and acceptance.     Plan Discussed with: CRNA and Surgeon  Anesthesia Plan Comments:        Anesthesia Quick Evaluation

## 2011-05-27 NOTE — Interval H&P Note (Signed)
History and Physical Interval Note:  05/27/2011 10:08 AM  [name]  has presented today for surgery, with the diagnosis of End stage renal disease  The various methods of treatment have been discussed with the patient and family. After consideration of risks, benefits and other options for treatment, the patient has consented to: ARTERIOVENOUS (AV) FISTULA CREATION LEFT ARM.  The patients' history has been reviewed, patient examined, no change in status, stable for surgery.  I have reviewed the patients' chart and labs.  Questions were answered to the patient's satisfaction.     Armstead Heiland S

## 2011-05-27 NOTE — Preoperative (Signed)
Beta Blockers   Reason not to administer Beta Blockers:Not Applicable 

## 2011-05-30 LAB — POCT I-STAT 4, (NA,K, GLUC, HGB,HCT)
HCT: 36 % (ref 36.0–46.0)
Hemoglobin: 12.2 g/dL (ref 12.0–15.0)

## 2011-06-15 ENCOUNTER — Ambulatory Visit: Payer: Medicare Other | Admitting: Vascular Surgery

## 2011-06-27 ENCOUNTER — Encounter (HOSPITAL_COMMUNITY): Payer: Self-pay | Admitting: Emergency Medicine

## 2011-06-27 ENCOUNTER — Emergency Department (HOSPITAL_COMMUNITY)
Admission: EM | Admit: 2011-06-27 | Discharge: 2011-06-27 | Disposition: A | Discharge status: Home / Self Care | Payer: Medicare Other | Attending: Emergency Medicine | Admitting: Emergency Medicine

## 2011-06-27 DIAGNOSIS — I999 Unspecified disorder of circulatory system: Secondary | ICD-10-CM | POA: Insufficient documentation

## 2011-06-27 DIAGNOSIS — Z79899 Other long term (current) drug therapy: Secondary | ICD-10-CM | POA: Insufficient documentation

## 2011-06-27 DIAGNOSIS — I2510 Atherosclerotic heart disease of native coronary artery without angina pectoris: Secondary | ICD-10-CM | POA: Insufficient documentation

## 2011-06-27 DIAGNOSIS — I998 Other disorder of circulatory system: Secondary | ICD-10-CM

## 2011-06-27 DIAGNOSIS — E875 Hyperkalemia: Secondary | ICD-10-CM | POA: Insufficient documentation

## 2011-06-27 DIAGNOSIS — E119 Type 2 diabetes mellitus without complications: Secondary | ICD-10-CM | POA: Insufficient documentation

## 2011-06-27 DIAGNOSIS — M79609 Pain in unspecified limb: Secondary | ICD-10-CM

## 2011-06-27 DIAGNOSIS — I739 Peripheral vascular disease, unspecified: Secondary | ICD-10-CM

## 2011-06-27 DIAGNOSIS — E039 Hypothyroidism, unspecified: Secondary | ICD-10-CM | POA: Insufficient documentation

## 2011-06-27 DIAGNOSIS — I10 Essential (primary) hypertension: Secondary | ICD-10-CM | POA: Insufficient documentation

## 2011-06-27 DIAGNOSIS — Z8673 Personal history of transient ischemic attack (TIA), and cerebral infarction without residual deficits: Secondary | ICD-10-CM | POA: Insufficient documentation

## 2011-06-27 HISTORY — PX: OTHER SURGICAL HISTORY: SHX169

## 2011-06-27 LAB — DIFFERENTIAL
Basophils Absolute: 0 10*3/uL (ref 0.0–0.1)
Basophils Relative: 1 % (ref 0–1)
Eosinophils Relative: 7 % — ABNORMAL HIGH (ref 0–5)
Monocytes Absolute: 0.5 10*3/uL (ref 0.1–1.0)
Neutro Abs: 2.5 10*3/uL (ref 1.7–7.7)

## 2011-06-27 LAB — BASIC METABOLIC PANEL
Calcium: 10 mg/dL (ref 8.4–10.5)
Chloride: 99 mEq/L (ref 96–112)
Creatinine, Ser: 1.74 mg/dL — ABNORMAL HIGH (ref 0.50–1.10)
GFR calc Af Amer: 29 mL/min — ABNORMAL LOW (ref >90)

## 2011-06-27 LAB — CBC
HCT: 38.5 % (ref 36.0–46.0)
MCHC: 32.2 g/dL (ref 30.0–36.0)
RDW: 12.8 % (ref 11.5–15.5)

## 2011-06-27 LAB — GLUCOSE, CAPILLARY: Glucose-Capillary: 115 mg/dL — ABNORMAL HIGH (ref 70–99)

## 2011-06-27 MED ORDER — SODIUM POLYSTYRENE SULFONATE 15 GM/60ML PO SUSP
30.0000 g | Freq: Once | ORAL | Status: AC
  Administered 2011-06-27: 30 g via ORAL
  Filled 2011-06-27: qty 120

## 2011-06-27 MED ORDER — ONDANSETRON 4 MG PO TBDP: 8.0000 mg | ORAL_TABLET | Freq: Once | ORAL | Status: DC

## 2011-06-27 MED ORDER — ONDANSETRON 4 MG PO TBDP
ORAL_TABLET | ORAL | Status: AC
  Filled 2011-06-27: qty 2

## 2011-06-27 MED ORDER — ONDANSETRON 4 MG PO TBDP
8.0000 mg | ORAL_TABLET | Freq: Once | ORAL | Status: AC
  Administered 2011-06-27: 8 mg via ORAL

## 2011-06-27 MED ORDER — HYDROCODONE-ACETAMINOPHEN 5-500 MG PO TABS: 1.0000 | ORAL_TABLET | Freq: Four times a day (QID) | ORAL | Status: DC | PRN

## 2011-06-27 MED ORDER — OXYCODONE-ACETAMINOPHEN 5-325 MG PO TABS
1.0000 | ORAL_TABLET | Freq: Once | ORAL | Status: AC
  Administered 2011-06-27: 1 via ORAL
  Filled 2011-06-27: qty 1

## 2011-06-27 NOTE — Discharge Instructions (Signed)
Ms [place] keep you appointment tomorrow to have your labs rechecked especially your potassium.  Potassium today was 5.6. Dr. Darrick Penna was seen in his office on Thursday keep the appointment as discussed with him. Take Percocet for pain do not drive with this medication or walk around a lot but this medication for fear he may fall. Return to the ER for severe pain or nausea vomiting or high fever.  Hyperkalemia Hyperkalemia means you have too much potassium in your blood. Potassium is a type of salt in the blood (electrolyte). Normally, your kidneys remove potassium from the body. Too much potassium can be life-threatening. HOME CARE  Only take medicine as told by your doctor.   Do not take vitamins or natural products unless your doctor says they are okay.   Keep all doctor visits as told.   Follow diet instructions as told by your doctor.  GET HELP RIGHT AWAY IF:  Your heartbeat is not regular or very slow.   You feel dizzy (lightheaded).   You feel weak.   You are short of breath.   You have chest pain.   You pass out (faint).  MAKE SURE YOU:   Understand these instructions.   Will watch your condition.   Will get help right away if you are not doing well or get worse.  Document Released: 03/07/2005 Document Revised: 02/24/2011 Document Reviewed: 08/25/2010 [facility] Patient Information 2012 [place], [place].Hyperkalemia Hyperkalemia means you have too much potassium in your blood. Potassium is a type of salt in the blood (electrolyte). Normally, your kidneys remove potassium from the body. Too much potassium can be life-threatening. HOME CARE  Only take medicine as told by your doctor.   Do not take vitamins or natural products unless your doctor says they are okay.   Keep all doctor visits as told.   Follow diet instructions as told by your doctor.  GET HELP RIGHT AWAY IF:  Your heartbeat is not regular or very slow.   You feel dizzy (lightheaded).   You feel weak.     You are short of breath.   You have chest pain.   You pass out (faint).  MAKE SURE YOU:   Understand these instructions.   Will watch your condition.   Will get help right away if you are not doing well or get worse.  Document Released: 03/07/2005 Document Revised: 02/24/2011 Document Reviewed: 08/25/2010 [facility] Patient Information 2012 [place], [place].Hyperkalemia Hyperkalemia means you have too much potassium in your blood. Potassium is a type of salt in the blood (electrolyte). Normally, your kidneys remove potassium from the body. Too much potassium can be life-threatening. HOME CARE  Only take medicine as told by your doctor.   Do not take vitamins or natural products unless your doctor says they are okay.   Keep all doctor visits as told.   Follow diet instructions as told by your doctor.  GET HELP RIGHT AWAY IF:  Your heartbeat is not regular or very slow.   You feel dizzy (lightheaded).   You feel weak.   You are short of breath.   You have chest pain.   You pass out (faint).  MAKE SURE YOU:   Understand these instructions.   Will watch your condition.   Will get help right away if you are not doing well or get worse.  Document Released: 03/07/2005 Document Revised: 02/24/2011 Document Reviewed: 08/25/2010 [facility] Patient Information 2012 [place], [place].  Hyperkalemia Hyperkalemia means you have too much potassium in your blood.  Potassium is a type of salt in the blood (electrolyte). Normally, your kidneys remove potassium from the body. Too much potassium can be life-threatening. HOME CARE  Only take medicine as told by your doctor.   Do not take vitamins or natural products unless your doctor says they are okay.   Keep all doctor visits as told.   Follow diet instructions as told by your doctor.  GET HELP RIGHT AWAY IF:  Your heartbeat is not regular or very slow.   You feel dizzy (lightheaded).   You feel weak.   You are short of  breath.   You have chest pain.   You pass out (faint).  MAKE SURE YOU:   Understand these instructions.   Will watch your condition.   Will get help right away if you are not doing well or get worse.  Document Released: 03/07/2005 Document Revised: 02/24/2011 Document Reviewed: 08/25/2010 [facility] Patient Information 2012 [place], [place].

## 2011-06-27 NOTE — Progress Notes (Signed)
ABI completed.  Preliminary report is ABI not ascertained on the right due to calcified vessels.  Left ABI is 0.68.

## 2011-06-27 NOTE — Progress Notes (Signed)
Left Lower Extremity Venous Duplex completed. *Preliminary Results*  No obvious evidence of left lower extremity deep vein thrombosis. All veins compressible.   06/27/2011 3:11 PM  Elpidio Galea RDMS, RDCS

## 2011-06-27 NOTE — ED Notes (Signed)
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 Dialysis shunt in left arm placed last month and it's been bothering her since then.

## 2011-06-27 NOTE — Consult Note (Signed)
[facility] [place] HISTORY AND PHYSICAL   History of Present Illness:  Patient is a 78 y.o. year old female who presents for evaluation of left arm pain s/p basilic vein transposition Edilia Bo a few weeks ago.  She also complains of left foot pain.  The pain in her arm is a burning sensation along the posterior aspect of her upper arm.  She denies numbness or tingling in the hand.  The pain in her foot is a constant aching.  The has been present for years but slowly progressive worsening over the past few weeks.  She has no history of ulcers on the feet.  She has been non-ambulatory for approximately 2 years and gets around via electric wheelchair.  She lives with her son.  Other medical problems include chronic kidney disease approaching need for dialysis, diabetes, hypertension, coronary artery disease, congestive failure and mild dementia all of which are currently stable.  Past Medical History  Diagnosis Date  . Coronary artery disease   . TIA (transient ischemic attack)   . Diabetes mellitus   . Hypertension   . Shingles   . Stroke   . Thyroid disease   . Anemia   . CAD (coronary artery disease) 02/08/2011  . Cardiomyopathy, idiopathic 02/08/2011  . Chronic kidney disease   . Angina     Takes Isosorbide  . Unstable angina 02/08/2011  . Myocardial infarct     x 3 unsure of years  . Irregular heartbeat   . Hypothyroidism     Takes Levothryroxine  . Hypothyroid 02/08/2011  . Ulcer   . GERD (gastroesophageal reflux disease)     takes Protonix and zantac  . Hx of transient ischemic attack (TIA)   . Memory loss   . CHF (congestive heart failure)     Takes Lasix  . Gout     takes allopurinol    Past Surgical History  Procedure Date  . Cardiac catheterization   . Back surgery     [facility]  . Eye surgery     Left eye surgery; cataract removal   Left basilic vein transposition  Social History History  Substance Use Topics  . Smoking  status: Never Smoker   . Smokeless tobacco: Never Used  . Alcohol Use: No    Family History Family History  Problem Relation Age of Onset  . Cancer Sister     STOMACH  . Cancer Brother     BONE  . Anesthesia problems Neg Hx   . Hypotension Neg Hx   . Malignant hyperthermia Neg Hx   . Pseudochol deficiency Neg Hx     Allergies  Allergies  Allergen Reactions  . Aspirin Nausea And Vomiting    [name]      Current Facility-Administered Medications  Medication Dose Route Frequency Provider Last Rate Last Dose  . oxyCODONE-acetaminophen (PERCOCET) 5-325 MG per tablet 1 tablet  1 tablet Oral Once Jethro Bastos, NP   1 tablet at 06/27/11 1234  . sodium polystyrene (KAYEXALATE) 15 GM/60ML suspension 30 g  30 g Oral Once Jethro Bastos, NP   30 g at 06/27/11 1521   Current Outpatient Prescriptions  Medication Sig Dispense Refill  . acetaminophen (TYLENOL) 500 MG tablet Take 500 mg by mouth as needed. For pain.       Marland Kitchen allopurinol (ZYLOPRIM) 100 MG tablet Take 100 mg by mouth daily.       Marland Kitchen  atorvastatin (LIPITOR) 40 MG tablet Take 40 mg by mouth daily.        . calcitRIOL (ROCALTROL) 0.25 MCG capsule Take 0.25 mcg by mouth 2 (two) times daily.       . carvedilol (COREG) 6.25 MG tablet Take 6.25 mg by mouth 2 (two) times daily with a meal.       . furosemide (LASIX) 40 MG tablet Take 40 mg by mouth 2 (two) times daily.       Marland Kitchen gabapentin (NEURONTIN) 300 MG capsule Take 300 mg by mouth 2 (two) times daily.      . isosorbide mononitrate (IMDUR) 60 MG 24 hr tablet Take 60 mg by mouth daily.       Marland Kitchen levothyroxine (SYNTHROID, LEVOTHROID) 125 MCG tablet Take 1 tablet (125 mcg total) by mouth daily.  30 tablet  11  . nitroGLYCERIN (NITROSTAT) 0.4 MG SL tablet Place 0.4 mg under the tongue every 5 (five) minutes as needed. For chest pain      . pantoprazole (PROTONIX) 40 MG tablet Take 40 mg by mouth daily.       . ranitidine  (ZANTAC) 150 MG tablet Take 150 mg by mouth daily as needed. For heart burn.        ROS:   General:  No weight loss, Fever, chills  HEENT: No recent headaches, no nasal bleeding, no visual changes, no sore throat  Neurologic: No dizziness, blackouts, seizures. No recent symptoms of stroke or mini- stroke. No recent episodes of slurred speech, or temporary blindness.  Cardiac: No recent episodes of chest pain/pressure, no shortness of breath at rest.  No shortness of breath with exertion.  Denies history of atrial fibrillation or irregular heartbeat  Vascular: No history of non-healing ulcer, No history of DVT   Pulmonary: No home oxygen, no productive cough, no hemoptysis,  No asthma or wheezing  Musculoskeletal:  [x ] Arthritis, [ ]  Low back pain,  [x ] Joint pain  Hematologic:No history of hypercoagulable state.  No history of easy bleeding.  No history of anemia  Gastrointestinal: No hematochezia or melena,  No gastroesophageal reflux, no trouble swallowing  Urinary: [x ] chronic Kidney disease, [ ]  on HD - [ ]  MWF or [ ]  TTHS, [ ]  Burning with urination, [ ]  Frequent urination, [ ]  Difficulty urinating;   Skin: No rashes  Psychological: No history of anxiety,  No history of depression   Physical Examination  Filed Vitals:   06/27/11 1144  BP: 101/48  Pulse: 71  Temp: 97.6 F (36.4 C)  TempSrc: Oral  Resp: 20  SpO2: 100%    There is no height or weight on file to calculate BMI.  General:  Alert and oriented, no acute distress HEENT: Normal Neck: No bruit or JVD Pulmonary: Clear to auscultation bilaterally Cardiac: Regular Rate and Rhythm without murmur Abdomen: Soft, non-tender, non-distended, no mass Skin: No rash, left foot erythema, cool extending to mid forefoot Extremity Pulses:  2+ radial, brachial, femoral bilaterally, absent dorsalis pedis, posterior tibial pulses bilaterally, left upper arm fistula pulsatile Musculoskeletal: No deformity or  edema  Neurologic: Upper and lower extremity motor 5/5 and symmetric   ASSESSMENT: #1 post op pain left basilic vein transposition most likely neuropraxia should improve with time  #2 left foot acute worsening of chronic ischemia now with threatened limb  Lengthy discussion with pt and family that she has poor blood flow to her left foot and a threatened limb.  She needs evaluation with arteriogram  if we are going to consider limb salvage.  This will most likely finish off her kidneys placing her on dialysis.  However, dialysis is probably inevitable either way.  She would be a candidate for a peripheral intervention if she has a lesion that is treatable.  However her exam is consistent with tibial disease and she would not be a candidate for a bypass due to her non ambulatory state with multiple co-morbidities.  I offered the patient the option of an arteriogram to assess for angioplasty and or stenting or consideration for a primary amputation for pain control.  She cannot decide which treatment option she would prefer at this point.   PLAN:  I have scheduled the patient to follow-up with me in 3 days to reassess her foot and give her time to decide which treatment plan she would prefer.  She will be discharged to home with narcotic pain meds per the ER.  I discussed the plan with them as well.  Fabienne Bruns, MD [facility] [place] Office: [phone] Pager: [phone]

## 2011-06-27 NOTE — ED Notes (Signed)
Pt with episode of vomiting on way out after discharge; pt to get zofran and reeval

## 2011-06-27 NOTE — ED Provider Notes (Signed)
History     CSN: [id number]  Arrival date & time 06/27/11  1138   First MD Initiated Contact with Patient 06/27/11 1225      Chief Complaint  Patient presents with  . Extremity Pain    (Consider location/radiation/quality/duration/timing/severity/associated sxs/prior treatment) Patient is a 78 y.o. female presenting with leg pain. The history is provided by the patient and a relative. No language interpreter was used.  Leg Pain  The incident occurred more than 1 week ago. The incident occurred at home. There was no injury mechanism. The pain is present in the left leg. The quality of the pain is described as aching. The pain is at a severity of 9/10. The pain is moderate. The pain has been constant since onset. Pertinent negatives include no numbness and no loss of motion. She reports no foreign bodies present. The symptoms are aggravated by bearing weight. She has tried nothing for the symptoms.     Patient here with complaint of LLE and LUE  pain.  Pain x > 1 month. She's had a dialysis graft inserted by [name] to the LUE 1 month ago. Has not been used yet.    for the last 2 days and that's why she's here today. The pain is in her L toes,  foot and extends up into the whole leg.  Her next appointment with [name] as on the 17th. Good bruit and thrill to the graft site. Poor pedal pulse and dopplerable on the left. Posterior tib also poor but better than the pedal pulse. Toes are cool to touch and discolored reddened.      Past Medical History  Diagnosis Date  . Coronary artery disease   . TIA (transient ischemic attack)   . Diabetes mellitus   . Hypertension   . Shingles   . Stroke   . Thyroid disease   . Anemia   . CAD (coronary artery disease) 02/08/2011  . Cardiomyopathy, idiopathic 02/08/2011  . Chronic kidney disease   . Angina     Takes Isosorbide  . Unstable angina 02/08/2011  . Myocardial infarct     x 3 unsure of years  . Irregular heartbeat   .  Hypothyroidism     Takes Levothryroxine  . Hypothyroid 02/08/2011  . Ulcer   . GERD (gastroesophageal reflux disease)     takes Protonix and zantac  . Hx of transient ischemic attack (TIA)   . Memory loss   . CHF (congestive heart failure)     Takes Lasix  . Gout     takes allopurinol    Past Surgical History  Procedure Date  . Cardiac catheterization   . Back surgery     [facility]  . Eye surgery     Left eye surgery; cataract removal    Family History  Problem Relation Age of Onset  . Cancer Sister     STOMACH  . Cancer Brother     BONE  . Anesthesia problems Neg Hx   . Hypotension Neg Hx   . Malignant hyperthermia Neg Hx   . Pseudochol deficiency Neg Hx     History  Substance Use Topics  . Smoking status: Never Smoker   . Smokeless tobacco: Never Used  . Alcohol Use: No    OB History    Grav Para Term Preterm Abortions TAB SAB Ect Mult Living                  Review of Systems  Unable to perform ROS Constitutional: Negative.   HENT: Negative.   Eyes: Negative.   Respiratory: Negative.  Negative for shortness of breath.   Cardiovascular: Negative.  Negative for chest pain and leg swelling.  Gastrointestinal: Negative.  Negative for nausea and vomiting.  Musculoskeletal: Positive for gait problem.       Lue AND lle PAIN  Neurological: Negative.  Negative for dizziness and numbness.  Psychiatric/Behavioral: Negative.   All other systems reviewed and are negative.    Allergies  Aspirin  Home Medications   Current Outpatient Rx  Name Route Sig Dispense Refill  . ACETAMINOPHEN 500 MG PO TABS Oral Take 500 mg by mouth as needed. For pain.     Marland Kitchen ALLOPURINOL 100 MG PO TABS Oral Take 100 mg by mouth daily.     . ATORVASTATIN CALCIUM 40 MG PO TABS Oral Take 40 mg by mouth daily.      Marland Kitchen CALCITRIOL 0.25 MCG PO CAPS Oral Take 0.25 mcg by mouth 2 (two) times daily.     Marland Kitchen CARVEDILOL 6.25 MG PO TABS Oral Take 6.25 mg by mouth 2 (two) times daily  with a meal.     . FUROSEMIDE 40 MG PO TABS Oral Take 40 mg by mouth 2 (two) times daily.     Marland Kitchen GABAPENTIN 300 MG PO CAPS Oral Take 300 mg by mouth 2 (two) times daily.    . ISOSORBIDE MONONITRATE ER 60 MG PO TB24 Oral Take 60 mg by mouth daily.     Marland Kitchen LEVOTHYROXINE SODIUM 125 MCG PO TABS Oral Take 1 tablet (125 mcg total) by mouth daily. 30 tablet 11  . NITROGLYCERIN 0.4 MG SL SUBL Sublingual Place 0.4 mg under the tongue every 5 (five) minutes as needed. For chest pain    . PANTOPRAZOLE SODIUM 40 MG PO TBEC Oral Take 40 mg by mouth daily.     Marland Kitchen RANITIDINE HCL 150 MG PO TABS Oral Take 150 mg by mouth daily as needed. For heart burn.      BP 101/48  Pulse 71  Temp(Src) 97.6 F (36.4 C) (Oral)  Resp 20  SpO2 100%  Physical Exam  Nursing note and vitals reviewed. Constitutional: She is oriented to person, place, and time. She appears well-developed and well-nourished.  HENT:  Head: Normocephalic and atraumatic.  Eyes: Conjunctivae and EOM are normal. Pupils are equal, round, and reactive to light.  Neck: Normal range of motion. Neck supple.  Cardiovascular: Normal rate.   Pulmonary/Chest: Effort normal.  Abdominal: Soft.  Musculoskeletal: Normal range of motion. She exhibits tenderness. She exhibits no edema.       LUE graft site tender with scab.  RLE tenderness especially the foot.  Doppled Poor pedal and posterior tib. Toes cool  Neurological: She is alert and oriented to person, place, and time. She has normal reflexes.  Skin: Skin is warm and dry.  Psychiatric: She has a normal mood and affect.    ED Course  Procedures (including critical care time)  Labs Reviewed  DIFFERENTIAL - Abnormal; Notable for the following:    Eosinophils Relative 7 (*)    All other components within normal limits  BASIC METABOLIC PANEL - Abnormal; Notable for the following:    Sodium 134 (*)    Potassium 5.6 (*)    Glucose, Bld 127 (*)    BUN 33 (*)    Creatinine, Ser 1.74 (*)    GFR calc  non Af Amer 25 (*)    GFR calc  Af Amer 29 (*)    All other components within normal limits  GLUCOSE, CAPILLARY - Abnormal; Notable for the following:    Glucose-Capillary 115 (*)    All other components within normal limits  CBC  URINALYSIS, ROUTINE W REFLEX MICROSCOPIC   No results found.   No diagnosis found.    MDM  Arterial doppler cancelled per Dr. Darrick Penna.  Will see patient in the office on Thursday for her ischemic foot.    Patient will have her potassium rechecked tomorrow with her pcp.  rx for pain medication.    Labs Reviewed  DIFFERENTIAL - Abnormal; Notable for the following:    Eosinophils Relative 7 (*)    All other components within normal limits  BASIC METABOLIC PANEL - Abnormal; Notable for the following:    Sodium 134 (*)    Potassium 5.6 (*)    Glucose, Bld 127 (*)    BUN 33 (*)    Creatinine, Ser 1.74 (*)    GFR calc non Af Amer 25 (*)    GFR calc Af Amer 29 (*)    All other components within normal limits  GLUCOSE, CAPILLARY - Abnormal; Notable for the following:    Glucose-Capillary 115 (*)    All other components within normal limits  CBC  LAB REPORT - SCANNED           Remi Haggard, NP 06/28/11 1010

## 2011-06-28 ENCOUNTER — Encounter: Payer: Self-pay | Admitting: Vascular Surgery

## 2011-06-29 NOTE — ED Provider Notes (Signed)
Medical screening examination/treatment/procedure(s) were conducted as a shared visit with non-physician practitioner(s) and myself.  I personally evaluated the patient during the encounter   Patient with ischemic appearing Lt foot- cool to touch, sluggish pulses by doppler, erythematous. Arterial duplex cancelled by nursing staff and incorrect VUS ordered. However, she has been seen by vascular surgery and will be discharged home to f/u with them. She is a poor surgical candidate and may need a limb amputation (see full note for details)  Forbes Cellar, MD 06/29/11 [phone]

## 2011-06-30 ENCOUNTER — Encounter: Payer: Self-pay | Admitting: Vascular Surgery

## 2011-06-30 ENCOUNTER — Other Ambulatory Visit: Payer: Self-pay

## 2011-06-30 ENCOUNTER — Ambulatory Visit (INDEPENDENT_AMBULATORY_CARE_PROVIDER_SITE_OTHER): Payer: Medicare Other | Admitting: Vascular Surgery

## 2011-06-30 VITALS — BP 145/71 | HR 61 | Temp 98.7°F | Resp 14 | Ht <= 58 in | Wt 111.0 lb

## 2011-06-30 DIAGNOSIS — I998 Other disorder of circulatory system: Secondary | ICD-10-CM

## 2011-06-30 DIAGNOSIS — I999 Unspecified disorder of circulatory system: Secondary | ICD-10-CM

## 2011-06-30 NOTE — Progress Notes (Signed)
Patient is an 78 year old female who presents to the office today for further followup regarding ischemia of her left lower extremity. She was seen at the toe in the emergency room on April 8. At that time she was offered a arteriogram for further investigation of her left lower extremity circulation versus a primary left below-knee amputation. At that time she wished to think about the 2 possibilities. She is nearing end-stage renal disease and is artery had a fistula placed. Had lengthy discussion with the patient and her family today regarding these 2 options. I did discuss with her that we could do a primary amputation of her left lower extremity which would solve her rest pain symptoms. She is currently having rest pain continuously not really a hydrocodone. The other option would be to do an arteriogram with the risk of contrast nephropathy. However I also emphasized that even without an arteriogram she probably will end up on dialysis in the near future since Dr. Darrick Penna is artery has place a fistula. At this point she has decided that she would prefer a primary amputation. I discussed with the family the risk of 10-15% chance of not healing a below-knee amputation and the possibility that she would need to return to the operating room for an above-knee amputation at some point down the road if the below-knee amputation did not heal. Other risks benefits and complications were discussed with the family as well.  Review of systems: The patient denies shortness of breath. The patient denies chest pain  Physical exam: Filed Vitals:   06/30/11 0925  BP: 145/71  Pulse: 61  Temp: 98.7 F (37.1 C)  TempSrc: Oral  Resp: 14  Height: 4\' 9"  (1.448 m)  Weight: 111 lb (50.349 kg)  SpO2: 98%    Extremities: Left foot has dependent rubor and is cooler than the right, 2+ femoral pulses bilaterally with absent popliteal and pedal pulses, no open wounds  Left upper extremity: Palpable thrill and audible  bruit in the left basilic vein transposition. 1 cm open area of incision with good granulation tissue at the base. Left hand pink warm and well-perfused  Assessment: Severe ischemia left foot with continuous rest pain. #2 patent left basilic vein transposition fistula with some separation of incision  Plan: #1 the patient has chosen a primary amputation of her left leg. She is scheduled for a left below-knee amputation on 07/05/2011. #2 the patient will keep her right her scheduled followup with [name] concerning her left basilic vein transposition fistula. She will wash this daily with soap and water apply some antibiotic ointment on the small open wound.  Fabienne Bruns, MD [facility] [place] Office: [phone] Pager: [phone]

## 2011-07-01 ENCOUNTER — Encounter (HOSPITAL_COMMUNITY): Payer: Self-pay

## 2011-07-01 ENCOUNTER — Encounter (HOSPITAL_COMMUNITY): Payer: Self-pay | Admitting: Pharmacy Technician

## 2011-07-01 ENCOUNTER — Encounter (HOSPITAL_COMMUNITY)
Admission: RE | Admit: 2011-07-01 | Discharge: 2011-07-01 | Disposition: A | Discharge status: Home / Self Care | Payer: Medicare Other | Source: Ambulatory Visit | Attending: Vascular Surgery | Admitting: Vascular Surgery

## 2011-07-01 HISTORY — DX: Peripheral vascular disease, unspecified: I73.9

## 2011-07-01 HISTORY — DX: Chronic kidney disease, stage 4 (severe): N18.4

## 2011-07-01 HISTORY — DX: Unspecified osteoarthritis, unspecified site: M19.90

## 2011-07-01 LAB — SURGICAL PCR SCREEN: MRSA, PCR: NEGATIVE

## 2011-07-01 NOTE — Pre-Procedure Instructions (Signed)
20 [name]  07/01/2011   Your procedure is scheduled on:  Tuesday, April 16  Report to [facility] at 0530 AM.  Call this number if you have problems the morning of surgery: [phone]   Remember:   Do not eat food:After Midnight.  May have clear liquids: up to 4 Hours before arrival.  Clear liquids include soda, tea, black coffee, apple or grape juice, broth.  Take these medicines the morning of surgery with A SIP OF WATER: *Carvedilol,Hydrocodone, Levothyroxine,Isosorbide**   Do not wear jewelry, make-up or nail polish.  Do not wear lotions, powders, or perfumes. You may wear deodorant.  Do not shave 48 hours prior to surgery.  Do not bring valuables to the hospital.  Contacts, dentures or bridgework may not be worn into surgery.  Leave suitcase in the car. After surgery it may be brought to your room.  For patients admitted to the hospital, checkout time is 11:00 AM the day of discharge.   Patients discharged the day of surgery will not be allowed to drive home.  Name and phone number of your driver: *n/a**  Special Instructions: CHG Shower Use Special Wash: 1/2 bottle night before surgery and 1/2 bottle morning of surgery.   Please read over the following fact sheets that you were given: Pain Booklet, Coughing and Deep Breathing, MRSA Information and Surgical Site Infection Prevention

## 2011-07-04 MED ORDER — CEFAZOLIN SODIUM 1-5 GM-% IV SOLN
1.0000 g | Freq: Once | INTRAVENOUS | Status: AC
  Administered 2011-07-05: 1 g via INTRAVENOUS
  Filled 2011-07-04: qty 50

## 2011-07-04 MED ORDER — SODIUM CHLORIDE 0.9 % IV SOLN: INTRAVENOUS | Status: DC

## 2011-07-04 NOTE — Consult Note (Addendum)
Anesthesia: Patient is a 78 year old female scheduled for a left BKA on 07/05/11.  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  I actually reviewed her preoperative chart as well in March.  Since then she has developed limb threatening ischemia in her LLE.  She opted for primary amputation as it was felt she was not a candidate for a LE bypass procedure due to inmobilitly and undergoing a arteriogram to evaluate if she was a candidate for percutaneous intervention may expedite her need for HD.    Her primary Cardiologist is Dr. Rennis [facility]).  He last saw her on 05/20/11 for follow-up of CHF and PVD.  She was hospitalized in July 2012 for chest pain (with negative cardiac enzymes) and was found to be in CHF with new EF 30-40% and global hypokinesis. 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 A repeat echo on 04/25/11 showed:  Mild LVH, EF now 50-55%, low normal LV systolic function, impaired LV relaxation, no regional wall abnormalities, trace MR, mild to mod mitral annular calcification, mild TR, RV systolic pressure 30-40 mmHg, AV moderate sclerosis without stenosis.   Of note, she did have an abnormal stress test on 06/01/09 that showed moderate perfusion defect due to infarct/scar and mid peri-infarct ischemia in the inferiolateral position. She had had a cath in 2009 that showed mild to moderate RCA stenosis so it was felt she had most likely experienced a ruptured plaque. She was asymptomatic at that time and a decision was made to treat her medically.  However, during her July 2012 admission for chest pain a decision was made to go ahead and proceed with cardiac cath on 10/05/10.  (I cannot find a actual dictated report in E-chart or Epic,  but found a summary of the findings in a discharge summary from 7//12.  I asked [facility] to fax over a dictated report if they find one available at their office.)  Cath findings showed: "Cardiac catheterization that was done on October 05, 2010, left main was normal. Circumflex showed mild-to-moderate irregularities, proximal less than 60%. LAD crossed the apex. Second diagonal was okay. First diagonal showed ostial 40%, mid diagonal just proximal to bifurcation with 80% narrowing, 1.5 mm vessel. RCA was a large vessel, proximal 30%, sequential irregularities and minimal irregularities. Mid minimal irregularities, PDA okay, ongoing RCA after PDA 50-60%."   [facility] on 05/20/11 showed NSR, lateral T wave abnormality which was been present since at least March 2011.   CXR from 02/07/11 showed no acute abnormalities.   Labs noted. Cr elevated as expected. K is 5.6. She will get an ISTAT on the DOS. Glucose 127. H/H 12.4/38.5.    Plan to proceed if no acute CV symptoms.  Reviewed with Anesthesiologist Dr. Michelle Piper who agrees with this plan.

## 2011-07-05 ENCOUNTER — Encounter (HOSPITAL_COMMUNITY): Payer: Self-pay | Admitting: Vascular Surgery

## 2011-07-05 ENCOUNTER — Encounter (HOSPITAL_COMMUNITY): Payer: Self-pay | Admitting: *Deleted

## 2011-07-05 ENCOUNTER — Encounter: Payer: Self-pay | Admitting: Vascular Surgery

## 2011-07-05 ENCOUNTER — Ambulatory Visit (HOSPITAL_COMMUNITY): Payer: Medicare Other | Admitting: Vascular Surgery

## 2011-07-05 ENCOUNTER — Encounter (HOSPITAL_COMMUNITY)
Admission: RE | Disposition: A | Discharge status: Skilled Nursing Facility | Payer: Self-pay | Source: Ambulatory Visit | Attending: Vascular Surgery

## 2011-07-05 ENCOUNTER — Encounter (HOSPITAL_COMMUNITY): Payer: Self-pay | Admitting: General Practice

## 2011-07-05 ENCOUNTER — Inpatient Hospital Stay (HOSPITAL_COMMUNITY)
Admission: RE | Admit: 2011-07-05 | Discharge: 2011-07-12 | DRG: 239 | Disposition: A | Discharge status: Skilled Nursing Facility | Payer: Medicare Other | Source: Ambulatory Visit | Attending: Vascular Surgery | Admitting: Vascular Surgery

## 2011-07-05 DIAGNOSIS — I2510 Atherosclerotic heart disease of native coronary artery without angina pectoris: Secondary | ICD-10-CM | POA: Diagnosis present

## 2011-07-05 DIAGNOSIS — W06XXXA Fall from bed, initial encounter: Secondary | ICD-10-CM | POA: Diagnosis not present

## 2011-07-05 DIAGNOSIS — I120 Hypertensive chronic kidney disease with stage 5 chronic kidney disease or end stage renal disease: Secondary | ICD-10-CM | POA: Diagnosis present

## 2011-07-05 DIAGNOSIS — I998 Other disorder of circulatory system: Secondary | ICD-10-CM

## 2011-07-05 DIAGNOSIS — I428 Other cardiomyopathies: Secondary | ICD-10-CM | POA: Diagnosis present

## 2011-07-05 DIAGNOSIS — Z79899 Other long term (current) drug therapy: Secondary | ICD-10-CM

## 2011-07-05 DIAGNOSIS — D62 Acute posthemorrhagic anemia: Secondary | ICD-10-CM | POA: Diagnosis not present

## 2011-07-05 DIAGNOSIS — Z8673 Personal history of transient ischemic attack (TIA), and cerebral infarction without residual deficits: Secondary | ICD-10-CM

## 2011-07-05 DIAGNOSIS — E119 Type 2 diabetes mellitus without complications: Secondary | ICD-10-CM | POA: Diagnosis present

## 2011-07-05 DIAGNOSIS — M109 Gout, unspecified: Secondary | ICD-10-CM | POA: Diagnosis present

## 2011-07-05 DIAGNOSIS — N186 End stage renal disease: Secondary | ICD-10-CM | POA: Diagnosis not present

## 2011-07-05 DIAGNOSIS — I509 Heart failure, unspecified: Secondary | ICD-10-CM | POA: Diagnosis present

## 2011-07-05 DIAGNOSIS — Z01812 Encounter for preprocedural laboratory examination: Secondary | ICD-10-CM

## 2011-07-05 DIAGNOSIS — S8000XA Contusion of unspecified knee, initial encounter: Secondary | ICD-10-CM | POA: Diagnosis not present

## 2011-07-05 DIAGNOSIS — I70229 Atherosclerosis of native arteries of extremities with rest pain, unspecified extremity: Principal | ICD-10-CM | POA: Diagnosis present

## 2011-07-05 DIAGNOSIS — K5900 Constipation, unspecified: Secondary | ICD-10-CM | POA: Diagnosis not present

## 2011-07-05 DIAGNOSIS — K219 Gastro-esophageal reflux disease without esophagitis: Secondary | ICD-10-CM | POA: Diagnosis present

## 2011-07-05 DIAGNOSIS — I252 Old myocardial infarction: Secondary | ICD-10-CM

## 2011-07-05 DIAGNOSIS — E039 Hypothyroidism, unspecified: Secondary | ICD-10-CM | POA: Diagnosis present

## 2011-07-05 DIAGNOSIS — Z7902 Long term (current) use of antithrombotics/antiplatelets: Secondary | ICD-10-CM

## 2011-07-05 DIAGNOSIS — Y9210 Unspecified residential institution as the place of occurrence of the external cause: Secondary | ICD-10-CM | POA: Diagnosis not present

## 2011-07-05 DIAGNOSIS — I70269 Atherosclerosis of native arteries of extremities with gangrene, unspecified extremity: Secondary | ICD-10-CM

## 2011-07-05 HISTORY — PX: AMPUTATION: SHX166

## 2011-07-05 HISTORY — PX: OTHER SURGICAL HISTORY: SHX169

## 2011-07-05 LAB — CREATININE, SERUM
GFR calc Af Amer: 18 mL/min — ABNORMAL LOW (ref >90)
GFR calc non Af Amer: 15 mL/min — ABNORMAL LOW (ref >90)

## 2011-07-05 LAB — CBC
Hemoglobin: 10.4 g/dL — ABNORMAL LOW (ref 12.0–15.0)
MCHC: 31.6 g/dL (ref 30.0–36.0)
RBC: 3.4 MIL/uL — ABNORMAL LOW (ref 3.87–5.11)

## 2011-07-05 SURGERY — AMPUTATION BELOW KNEE
Anesthesia: General | Site: Leg Lower | Laterality: Left | Wound class: Clean

## 2011-07-05 MED ORDER — DEXTROSE 5 % IV SOLN
INTRAVENOUS | Status: DC | PRN
  Administered 2011-07-05: 07:00:00 via INTRAVENOUS

## 2011-07-05 MED ORDER — POLYETHYLENE GLYCOL 3350 17 G PO PACK
17.0000 g | PACK | Freq: Every day | ORAL | Status: DC | PRN
  Filled 2011-07-05: qty 1

## 2011-07-05 MED ORDER — ONDANSETRON HCL 4 MG/2ML IJ SOLN
INTRAMUSCULAR | Status: DC | PRN
  Administered 2011-07-05: 4 mg via INTRAVENOUS

## 2011-07-05 MED ORDER — DOCUSATE SODIUM 100 MG PO CAPS
100.0000 mg | ORAL_CAPSULE | Freq: Every day | ORAL | Status: DC
  Administered 2011-07-06 – 2011-07-12 (×7): 100 mg via ORAL
  Filled 2011-07-05 (×7): qty 1

## 2011-07-05 MED ORDER — ENOXAPARIN SODIUM 30 MG/0.3ML ~~LOC~~ SOLN
30.0000 mg | SUBCUTANEOUS | Status: DC
  Administered 2011-07-06 – 2011-07-12 (×7): 30 mg via SUBCUTANEOUS
  Filled 2011-07-05 (×7): qty 0.3

## 2011-07-05 MED ORDER — ACETAMINOPHEN 650 MG RE SUPP: 325.0000 mg | RECTAL | Status: DC | PRN

## 2011-07-05 MED ORDER — HYDROMORPHONE HCL PF 1 MG/ML IJ SOLN
0.5000 mg | INTRAMUSCULAR | Status: DC | PRN
  Administered 2011-07-05 (×2): 0.5 mg via INTRAVENOUS
  Administered 2011-07-06 (×3): 1 mg via INTRAVENOUS
  Filled 2011-07-05 (×7): qty 1

## 2011-07-05 MED ORDER — ATORVASTATIN CALCIUM 40 MG PO TABS
40.0000 mg | ORAL_TABLET | Freq: Every day | ORAL | Status: DC
  Administered 2011-07-06 – 2011-07-12 (×7): 40 mg via ORAL
  Filled 2011-07-05 (×7): qty 1

## 2011-07-05 MED ORDER — CLOPIDOGREL BISULFATE 75 MG PO TABS
75.0000 mg | ORAL_TABLET | Freq: Every day | ORAL | Status: DC
  Administered 2011-07-06 – 2011-07-12 (×7): 75 mg via ORAL
  Filled 2011-07-05 (×8): qty 1

## 2011-07-05 MED ORDER — SODIUM CHLORIDE 0.9 % IV SOLN
INTRAVENOUS | Status: DC | PRN
  Administered 2011-07-05: 07:00:00 via INTRAVENOUS

## 2011-07-05 MED ORDER — SODIUM CHLORIDE 0.9 % IV SOLN: 250.0000 mL | INTRAVENOUS | Status: DC | PRN

## 2011-07-05 MED ORDER — ALUM & MAG HYDROXIDE-SIMETH 200-200-20 MG/5ML PO SUSP: 15.0000 mL | ORAL | Status: DC | PRN

## 2011-07-05 MED ORDER — DEXTROSE 5 % IV SOLN
1.5000 g | Freq: Two times a day (BID) | INTRAVENOUS | Status: AC
  Administered 2011-07-05 – 2011-07-06 (×2): 1.5 g via INTRAVENOUS
  Filled 2011-07-05 (×2): qty 1.5

## 2011-07-05 MED ORDER — FLEET ENEMA 7-19 GM/118ML RE ENEM
1.0000 | ENEMA | Freq: Once | RECTAL | Status: AC | PRN
  Filled 2011-07-05: qty 1

## 2011-07-05 MED ORDER — ACETAMINOPHEN 325 MG PO TABS: 325.0000 mg | ORAL_TABLET | ORAL | Status: DC | PRN

## 2011-07-05 MED ORDER — HYDROMORPHONE HCL PF 1 MG/ML IJ SOLN
INTRAMUSCULAR | Status: AC
  Filled 2011-07-05: qty 1

## 2011-07-05 MED ORDER — CALCITRIOL 0.25 MCG PO CAPS
0.2500 ug | ORAL_CAPSULE | Freq: Two times a day (BID) | ORAL | Status: DC
  Administered 2011-07-05 – 2011-07-12 (×14): 0.25 ug via ORAL
  Filled 2011-07-05 (×15): qty 1

## 2011-07-05 MED ORDER — LABETALOL HCL 5 MG/ML IV SOLN
10.0000 mg | INTRAVENOUS | Status: DC | PRN
  Administered 2011-07-11: 10 mg via INTRAVENOUS
  Filled 2011-07-05: qty 4

## 2011-07-05 MED ORDER — ISOSORBIDE MONONITRATE ER 60 MG PO TB24
60.0000 mg | ORAL_TABLET | Freq: Every day | ORAL | Status: DC
  Administered 2011-07-06 – 2011-07-12 (×7): 60 mg via ORAL
  Filled 2011-07-05 (×7): qty 1

## 2011-07-05 MED ORDER — GUAIFENESIN-DM 100-10 MG/5ML PO SYRP: 15.0000 mL | ORAL_SOLUTION | ORAL | Status: DC | PRN

## 2011-07-05 MED ORDER — BISACODYL 10 MG RE SUPP: 10.0000 mg | Freq: Every day | RECTAL | Status: DC | PRN

## 2011-07-05 MED ORDER — FENTANYL CITRATE 0.05 MG/ML IJ SOLN
INTRAMUSCULAR | Status: DC | PRN
  Administered 2011-07-05 (×2): 50 ug via INTRAVENOUS
  Administered 2011-07-05: 25 ug via INTRAVENOUS

## 2011-07-05 MED ORDER — ONDANSETRON HCL 4 MG/2ML IJ SOLN: 4.0000 mg | Freq: Once | INTRAMUSCULAR | Status: DC | PRN

## 2011-07-05 MED ORDER — NITROGLYCERIN 0.4 MG SL SUBL
0.4000 mg | SUBLINGUAL_TABLET | SUBLINGUAL | Status: DC | PRN
  Filled 2011-07-05: qty 25

## 2011-07-05 MED ORDER — LEVOTHYROXINE SODIUM 125 MCG PO TABS
125.0000 ug | ORAL_TABLET | Freq: Every day | ORAL | Status: DC
  Administered 2011-07-06 – 2011-07-12 (×7): 125 ug via ORAL
  Filled 2011-07-05 (×8): qty 1

## 2011-07-05 MED ORDER — SODIUM CHLORIDE 0.9 % IJ SOLN: 3.0000 mL | INTRAMUSCULAR | Status: DC | PRN

## 2011-07-05 MED ORDER — FUROSEMIDE 40 MG PO TABS
40.0000 mg | ORAL_TABLET | Freq: Two times a day (BID) | ORAL | Status: DC
  Administered 2011-07-06 – 2011-07-12 (×11): 40 mg via ORAL
  Filled 2011-07-05 (×16): qty 1

## 2011-07-05 MED ORDER — BACITRACIN ZINC 500 UNIT/GM EX OINT
TOPICAL_OINTMENT | CUTANEOUS | Status: DC | PRN
  Administered 2011-07-05: 1 via TOPICAL

## 2011-07-05 MED ORDER — PHENOL 1.4 % MT LIQD
1.0000 | OROMUCOSAL | Status: DC | PRN
  Filled 2011-07-05: qty 177

## 2011-07-05 MED ORDER — LIDOCAINE HCL (CARDIAC) 20 MG/ML IV SOLN
INTRAVENOUS | Status: DC | PRN
  Administered 2011-07-05: 65 mg via INTRAVENOUS

## 2011-07-05 MED ORDER — ONDANSETRON HCL 4 MG/2ML IJ SOLN: 4.0000 mg | Freq: Four times a day (QID) | INTRAMUSCULAR | Status: DC | PRN

## 2011-07-05 MED ORDER — PROPOFOL 10 MG/ML IV EMUL
INTRAVENOUS | Status: DC | PRN
  Administered 2011-07-05: 30 mL via INTRAVENOUS
  Administered 2011-07-05: 100 mL via INTRAVENOUS

## 2011-07-05 MED ORDER — PANTOPRAZOLE SODIUM 40 MG PO TBEC
40.0000 mg | DELAYED_RELEASE_TABLET | Freq: Every day | ORAL | Status: DC
  Administered 2011-07-06 – 2011-07-12 (×7): 40 mg via ORAL
  Filled 2011-07-05 (×7): qty 1

## 2011-07-05 MED ORDER — POTASSIUM CHLORIDE CRYS ER 20 MEQ PO TBCR: 20.0000 meq | EXTENDED_RELEASE_TABLET | Freq: Every day | ORAL | Status: DC | PRN

## 2011-07-05 MED ORDER — METOPROLOL TARTRATE 1 MG/ML IV SOLN
2.0000 mg | INTRAVENOUS | Status: DC | PRN
  Administered 2011-07-06: 2 mg via INTRAVENOUS
  Filled 2011-07-05: qty 5

## 2011-07-05 MED ORDER — HYDROMORPHONE HCL PF 1 MG/ML IJ SOLN
0.2500 mg | INTRAMUSCULAR | Status: DC | PRN
  Administered 2011-07-05: 0.5 mg via INTRAVENOUS
  Administered 2011-07-05 (×3): 0.25 mg via INTRAVENOUS
  Administered 2011-07-05: 0.5 mg via INTRAVENOUS
  Administered 2011-07-05: 0.25 mg via INTRAVENOUS

## 2011-07-05 MED ORDER — HYDRALAZINE HCL 20 MG/ML IJ SOLN
10.0000 mg | INTRAMUSCULAR | Status: DC | PRN
  Filled 2011-07-05: qty 0.5

## 2011-07-05 MED ORDER — SODIUM CHLORIDE 0.9 % IJ SOLN
3.0000 mL | Freq: Two times a day (BID) | INTRAMUSCULAR | Status: DC
  Administered 2011-07-06: 3 mL via INTRAVENOUS

## 2011-07-05 MED ORDER — OXYCODONE-ACETAMINOPHEN 5-325 MG PO TABS
1.0000 | ORAL_TABLET | ORAL | Status: DC | PRN
  Administered 2011-07-05 – 2011-07-08 (×3): 1 via ORAL
  Administered 2011-07-09 – 2011-07-12 (×10): 2 via ORAL
  Filled 2011-07-05 (×3): qty 2
  Filled 2011-07-05: qty 1
  Filled 2011-07-05 (×5): qty 2
  Filled 2011-07-05 (×2): qty 1
  Filled 2011-07-05 (×2): qty 2

## 2011-07-05 MED ORDER — MORPHINE SULFATE 2 MG/ML IJ SOLN: 0.0500 mg/kg | INTRAMUSCULAR | Status: DC | PRN

## 2011-07-05 MED ORDER — 0.9 % SODIUM CHLORIDE (POUR BTL) OPTIME
TOPICAL | Status: DC | PRN
  Administered 2011-07-05: 1000 mL

## 2011-07-05 MED ORDER — POTASSIUM CHLORIDE IN NACL 20-0.9 MEQ/L-% IV SOLN
INTRAVENOUS | Status: DC
  Administered 2011-07-05: 75 mL via INTRAVENOUS
  Administered 2011-07-06 (×2): via INTRAVENOUS
  Filled 2011-07-05 (×5): qty 1000

## 2011-07-05 MED ORDER — CARVEDILOL 6.25 MG PO TABS
6.2500 mg | ORAL_TABLET | Freq: Two times a day (BID) | ORAL | Status: DC
  Administered 2011-07-06 – 2011-07-12 (×14): 6.25 mg via ORAL
  Filled 2011-07-05 (×16): qty 1

## 2011-07-05 SURGICAL SUPPLY — 54 items
BANDAGE ELASTIC 4 VELCRO ST LF (GAUZE/BANDAGES/DRESSINGS) ×2 IMPLANT
BANDAGE ELASTIC 6 VELCRO ST LF (GAUZE/BANDAGES/DRESSINGS) ×2 IMPLANT
BANDAGE ESMARK 6X9 LF (GAUZE/BANDAGES/DRESSINGS) ×1 IMPLANT
BANDAGE GAUZE ELAST BULKY 4 IN (GAUZE/BANDAGES/DRESSINGS) ×3 IMPLANT
BLADE SAW RECIP 87.9 MT (BLADE) ×2 IMPLANT
BNDG CMPR 9X6 STRL LF SNTH (GAUZE/BANDAGES/DRESSINGS) ×1
BNDG COHESIVE 6X5 TAN STRL LF (GAUZE/BANDAGES/DRESSINGS) ×2 IMPLANT
BNDG ESMARK 6X9 LF (GAUZE/BANDAGES/DRESSINGS) ×2
CANISTER SUCTION 2500CC (MISCELLANEOUS) ×2 IMPLANT
CLIP TI MEDIUM 6 (CLIP) IMPLANT
CLOTH BEACON ORANGE TIMEOUT ST (SAFETY) ×2 IMPLANT
COVER SURGICAL LIGHT HANDLE (MISCELLANEOUS) ×4 IMPLANT
CUFF TOURNIQUET SINGLE 24IN (TOURNIQUET CUFF) IMPLANT
CUFF TOURNIQUET SINGLE 34IN LL (TOURNIQUET CUFF) IMPLANT
CUFF TOURNIQUET SINGLE 44IN (TOURNIQUET CUFF) IMPLANT
DRAIN CHANNEL 19F RND (DRAIN) IMPLANT
DRAPE ORTHO SPLIT 77X108 STRL (DRAPES) ×4
DRAPE PROXIMA HALF (DRAPES) ×2 IMPLANT
DRAPE SURG ORHT 6 SPLT 77X108 (DRAPES) ×2 IMPLANT
DRAPE U-SHAPE 47X51 STRL (DRAPES) ×2 IMPLANT
DRSG ADAPTIC 3X8 NADH LF (GAUZE/BANDAGES/DRESSINGS) ×2 IMPLANT
ELECT REM PT RETURN 9FT ADLT (ELECTROSURGICAL) ×2
ELECTRODE REM PT RTRN 9FT ADLT (ELECTROSURGICAL) ×1 IMPLANT
EVACUATOR SILICONE 100CC (DRAIN) IMPLANT
GLOVE BIO SURGEON STRL SZ 6.5 (GLOVE) ×1 IMPLANT
GLOVE BIO SURGEON STRL SZ7.5 (GLOVE) ×2 IMPLANT
GLOVE BIOGEL PI IND STRL 7.0 (GLOVE) IMPLANT
GLOVE BIOGEL PI IND STRL 7.5 (GLOVE) ×1 IMPLANT
GLOVE BIOGEL PI INDICATOR 7.0 (GLOVE) ×2
GLOVE BIOGEL PI INDICATOR 7.5 (GLOVE) ×1
GLOVE ECLIPSE 6.5 STRL STRAW (GLOVE) ×1 IMPLANT
GOWN EXTRA PROTECTION XXL 0583 (GOWNS) ×1 IMPLANT
GOWN STRL NON-REIN LRG LVL3 (GOWN DISPOSABLE) ×4 IMPLANT
KIT BASIN OR (CUSTOM PROCEDURE TRAY) ×2 IMPLANT
KIT ROOM TURNOVER OR (KITS) ×2 IMPLANT
NS IRRIG 1000ML POUR BTL (IV SOLUTION) ×2 IMPLANT
PACK GENERAL/GYN (CUSTOM PROCEDURE TRAY) ×2 IMPLANT
PAD ARMBOARD 7.5X6 YLW CONV (MISCELLANEOUS) ×4 IMPLANT
PADDING CAST COTTON 6X4 STRL (CAST SUPPLIES) ×2 IMPLANT
SPONGE GAUZE 4X4 12PLY (GAUZE/BANDAGES/DRESSINGS) ×3 IMPLANT
STAPLER VISISTAT 35W (STAPLE) ×2 IMPLANT
STOCKINETTE IMPERVIOUS LG (DRAPES) ×2 IMPLANT
SUT ETHILON 3 0 PS 1 (SUTURE) IMPLANT
SUT SILK 0 TIES 10X30 (SUTURE) ×1 IMPLANT
SUT SILK 2 0 (SUTURE) ×2
SUT SILK 2 0 SH CR/8 (SUTURE) ×2 IMPLANT
SUT SILK 2-0 18XBRD TIE 12 (SUTURE) ×1 IMPLANT
SUT SILK 3 0 (SUTURE) ×2
SUT SILK 3-0 18XBRD TIE 12 (SUTURE) ×1 IMPLANT
SUT VIC AB 2-0 CT1 18 (SUTURE) ×3 IMPLANT
TOWEL OR 17X24 6PK STRL BLUE (TOWEL DISPOSABLE) ×2 IMPLANT
TOWEL OR 17X26 10 PK STRL BLUE (TOWEL DISPOSABLE) ×2 IMPLANT
UNDERPAD 30X30 INCONTINENT (UNDERPADS AND DIAPERS) ×2 IMPLANT
WATER STERILE IRR 1000ML POUR (IV SOLUTION) ×2 IMPLANT

## 2011-07-05 NOTE — Anesthesia Preprocedure Evaluation (Addendum)
Anesthesia Evaluation  Patient identified by MRN, date of birth, ID band Patient awake    Reviewed: Allergy & Precautions, H&P , NPO status , Patient's Chart, lab work & pertinent test results, reviewed documented beta blocker date and time   Airway Mallampati: I TM Distance: >3 FB   Mouth opening: Limited Mouth Opening  Dental  (+) Edentulous Upper and Edentulous Lower   Pulmonary    Pulmonary exam normal       Cardiovascular hypertension, Pt. on medications and Pt. on home beta blockers + angina with exertion + CAD, + Past MI and +CHF Rhythm:Regular Rate:Normal     Neuro/Psych TIACVA    GI/Hepatic GERD-  ,  Endo/Other  Diabetes mellitus-, Well Controlled, GestationalHypothyroidism   Renal/GU      Musculoskeletal  (+) Arthritis -, Osteoarthritis,    Abdominal   Peds  Hematology   Anesthesia Other Findings   Reproductive/Obstetrics                         Anesthesia Physical Anesthesia Plan  ASA: III  Anesthesia Plan: General   Post-op Pain Management:    Induction: Intravenous  Airway Management Planned: LMA  Additional Equipment:   Intra-op Plan:   Post-operative Plan: Extubation in OR  Informed Consent: I have reviewed the patients History and Physical, chart, labs and discussed the procedure including the risks, benefits and alternatives for the proposed anesthesia with the patient or authorized representative who has indicated his/her understanding and acceptance.   Dental advisory given  Plan Discussed with: CRNA and Surgeon  Anesthesia Plan Comments:         Anesthesia Quick Evaluation

## 2011-07-05 NOTE — Transfer of Care (Signed)
Immediate Anesthesia Transfer of Care Note  Patient: [name]  Procedure(s) Performed: Procedure(s) (LRB): AMPUTATION BELOW KNEE (Left)  Patient Location: PACU  Anesthesia Type: General  Level of Consciousness: awake, alert , oriented and patient cooperative  Airway & Oxygen Therapy: Patient Spontanous Breathing and Patient connected to nasal cannula oxygen  Post-op Assessment: Report given to PACU RN and Post -op Vital signs reviewed and stable  Post vital signs: Reviewed and stable  Complications: No apparent anesthesia complications

## 2011-07-05 NOTE — Progress Notes (Signed)
+   bruit; + thrill (pre-existing LUE graft).

## 2011-07-05 NOTE — Anesthesia Postprocedure Evaluation (Signed)
Anesthesia Post Note  Patient: [name]  Procedure(s) Performed: Procedure(s) (LRB): AMPUTATION BELOW KNEE (Left)  Anesthesia type: general  Patient location: PACU  Post pain: Pain level controlled  Post assessment: Patient's Cardiovascular Status Stable  Last Vitals:  Filed Vitals:   07/05/11 0851  BP: 89/72  Pulse:   Temp: 36.5 C  Resp:     Post vital signs: Reviewed and stable  Level of consciousness: sedated  Complications: No apparent anesthesia complications

## 2011-07-05 NOTE — Interval H&P Note (Signed)
History and Physical Interval Note:  07/05/2011 7:14 AM  [name]  has presented today for surgery, with the diagnosis of Peripheral Vascular Disease  The various methods of treatment have been discussed with the patient and family. After consideration of risks, benefits and other options for treatment, the patient has consented to: AMPUTATION BELOW KNEE (Left) as a surgical intervention .  The patients' history has been reviewed, patient examined, no change in status, stable for surgery.  I have reviewed the patients' chart and labs.  Questions were answered to the patient's satisfaction.     [name]

## 2011-07-05 NOTE — Op Note (Signed)
NAME: [name]   MRN: [id number] DOB: January 28, 1924    DATE OF OPERATION: 07/05/2011  PREOP DIAGNOSIS: Ischemic left lower extremity  POSTOP DIAGNOSIS: Same  PROCEDURE: Left below knee amputation  SURGEON: Di Kindle. Edilia Bo, MD, FACS  ASSIST: Della Goo PA  ANESTHESIA: Gen.   EBL: minimal  INDICATIONS: [name] is a 78 y.o. female who presented with an ischemic left lower extremity. She was evaluated by Dr. Darrick Penna and arrangements were made for left below the knee amputation.  FINDINGS: The tissue appeared well perfused. No signs of infection appear  TECHNIQUE: The patient was brought to the operating room and received a general anesthetic. The left lower extremity was prepped and draped in the usual sterile fashion. Tourniquet had been placed on the upper thigh the circumference of the limb was measured distal to the tibial tuberosity. Two thirds of this distance was used to mark the anterior skin flap. A long posterior flap of equal length was marked. The leg was exsanguinated with an Esmarch bandage. The incision was carried down through the skin subcutaneous tissue muscle and fascia. The tibia and fibula were dissected free circumferentially. The periosteum was elevated. The tibia was divided proximal to the level of skin division in the anterior aspect of the tibia was beveled. The fibula was divided proximal to the level of the tibia. The arteries and veins were individually suture ligated with 2-0 silk ties. The tourniquet was then released. Additional hemostasis was obtained using electrocautery and 2-0 silk ties. The wound is then irrigated with copious amounts of saline. The fascial layer was closed with interrupted 2-0 Vicryl's. The skin was closed with staples. Sterile dressing was applied. The patient tolerated the procedure well and was transferred to the recovery room in stable condition. All needle and sponge counts were correct.  Waverly Ferrari, MD,  FACS [facility]  DATE OF DICTATION:   07/05/2011

## 2011-07-05 NOTE — H&P (View-Only) (Signed)
Patient is an 78-year-old female who presents to the office today for further followup regarding ischemia of her left lower extremity. She was seen at the toe in the emergency room on April 8. At that time she was offered a arteriogram for further investigation of her left lower extremity circulation versus a primary left below-knee amputation. At that time she wished to think about the 2 possibilities. She is nearing end-stage renal disease and is artery had a fistula placed. Had lengthy discussion with the patient and her family today regarding these 2 options. I did discuss with her that we could do a primary amputation of her left lower extremity which would solve her rest pain symptoms. She is currently having rest pain continuously not really a hydrocodone. The other option would be to do an arteriogram with the risk of contrast nephropathy. However I also emphasized that even without an arteriogram she probably will end up on dialysis in the near future since Dr. Deterding is artery has place a fistula. At this point she has decided that she would prefer a primary amputation. I discussed with the family the risk of 10-15% chance of not healing a below-knee amputation and the possibility that she would need to return to the operating room for an above-knee amputation at some point down the road if the below-knee amputation did not heal. Other risks benefits and complications were discussed with the family as well.  Review of systems: The patient denies shortness of breath. The patient denies chest pain  Physical exam: Filed Vitals:   06/30/11 0925  BP: 145/71  Pulse: 61  Temp: 98.7 F (37.1 C)  TempSrc: Oral  Resp: 14  Height: 4' 9" (1.448 m)  Weight: 111 lb (50.349 kg)  SpO2: 98%    Extremities: Left foot has dependent rubor and is cooler than the right, 2+ femoral pulses bilaterally with absent popliteal and pedal pulses, no open wounds  Left upper extremity: Palpable thrill and audible  bruit in the left basilic vein transposition. 1 cm open area of incision with good granulation tissue at the base. Left hand pink warm and well-perfused  Assessment: Severe ischemia left foot with continuous rest pain. #2 patent left basilic vein transposition fistula with some separation of incision  Plan: #1 the patient has chosen a primary amputation of her left leg. She is scheduled for a left below-knee amputation on 07/05/2011. #2 the patient will keep her right her scheduled followup with Dr. Dickson concerning her left basilic vein transposition fistula. She will wash this daily with soap and water apply some antibiotic ointment on the small open wound.  Richad Ramsay, MD [facility] Office: [phone] Pager: [phone]  

## 2011-07-05 NOTE — Preoperative (Signed)
Beta Blockers  Pt took Coreg @ 4:45 this am

## 2011-07-05 NOTE — Progress Notes (Signed)
Pt has 1 inch unapprox section on LUA from graft placement x 1 month ago.  Wound bed is yellow/green, dry, no drainage, no odor, no redness.  Pt states it is painful and per family they were putting neosporin and bandaid on it at home PRN.  Bandaid applied to protect, will continue to monitor. Ave Filter

## 2011-07-06 ENCOUNTER — Ambulatory Visit: Payer: Medicare Other | Admitting: Vascular Surgery

## 2011-07-06 ENCOUNTER — Encounter (HOSPITAL_COMMUNITY): Payer: Self-pay | Admitting: Vascular Surgery

## 2011-07-06 DIAGNOSIS — L98499 Non-pressure chronic ulcer of skin of other sites with unspecified severity: Secondary | ICD-10-CM

## 2011-07-06 DIAGNOSIS — I739 Peripheral vascular disease, unspecified: Secondary | ICD-10-CM

## 2011-07-06 DIAGNOSIS — S88119A Complete traumatic amputation at level between knee and ankle, unspecified lower leg, initial encounter: Secondary | ICD-10-CM

## 2011-07-06 LAB — CBC
Hemoglobin: 9.9 g/dL — ABNORMAL LOW (ref 12.0–15.0)
MCH: 29.8 pg (ref 26.0–34.0)
RBC: 3.32 MIL/uL — ABNORMAL LOW (ref 3.87–5.11)
WBC: 10.5 10*3/uL (ref 4.0–10.5)

## 2011-07-06 LAB — BASIC METABOLIC PANEL
GFR calc non Af Amer: 16 mL/min — ABNORMAL LOW (ref >90)
Glucose, Bld: 79 mg/dL (ref 70–99)
Potassium: 5 mEq/L (ref 3.5–5.1)
Sodium: 139 mEq/L (ref 135–145)

## 2011-07-06 LAB — POCT I-STAT 4, (NA,K, GLUC, HGB,HCT)
Glucose, Bld: 93 mg/dL (ref 70–99)
HCT: 36 % (ref 36.0–46.0)
Hemoglobin: 12.2 g/dL (ref 12.0–15.0)
Potassium: 4.4 mEq/L (ref 3.5–5.1)
Sodium: 138 mEq/L (ref 135–145)

## 2011-07-06 MED ORDER — HYDROMORPHONE 0.3 MG/ML IV SOLN
INTRAVENOUS | Status: AC
  Filled 2011-07-06: qty 25

## 2011-07-06 MED ORDER — DIPHENHYDRAMINE HCL 12.5 MG/5ML PO ELIX
12.5000 mg | ORAL_SOLUTION | Freq: Four times a day (QID) | ORAL | Status: DC | PRN
  Filled 2011-07-06: qty 5

## 2011-07-06 MED ORDER — ONDANSETRON HCL 4 MG/2ML IJ SOLN: 4.0000 mg | Freq: Four times a day (QID) | INTRAMUSCULAR | Status: DC | PRN

## 2011-07-06 MED ORDER — DIPHENHYDRAMINE HCL 25 MG PO CAPS
25.0000 mg | ORAL_CAPSULE | Freq: Four times a day (QID) | ORAL | Status: DC | PRN
  Administered 2011-07-06 – 2011-07-12 (×13): 25 mg via ORAL
  Filled 2011-07-06 (×13): qty 1

## 2011-07-06 MED ORDER — SODIUM CHLORIDE 0.9 % IJ SOLN: 9.0000 mL | INTRAMUSCULAR | Status: DC | PRN

## 2011-07-06 MED ORDER — DIPHENHYDRAMINE HCL 50 MG/ML IJ SOLN: 12.5000 mg | Freq: Four times a day (QID) | INTRAMUSCULAR | Status: DC | PRN

## 2011-07-06 MED ORDER — HYDROMORPHONE 0.3 MG/ML IV SOLN
INTRAVENOUS | Status: DC
  Administered 2011-07-06: 0.8 mg via INTRAVENOUS
  Administered 2011-07-06: 10:00:00 via INTRAVENOUS
  Administered 2011-07-06: 0.2 mg via INTRAVENOUS
  Administered 2011-07-06: 0.59 mg via INTRAVENOUS
  Administered 2011-07-07: 0.4 mg via INTRAVENOUS
  Administered 2011-07-07: 0.599 mg via INTRAVENOUS
  Administered 2011-07-07: 0.4 mg via INTRAVENOUS
  Administered 2011-07-07: 0.1 mg via INTRAVENOUS
  Administered 2011-07-07: 1.39 mg via INTRAVENOUS
  Administered 2011-07-08: 0.599 mg via INTRAVENOUS
  Administered 2011-07-08: 0.4 mg via INTRAVENOUS
  Administered 2011-07-08: 0.2 mg via INTRAVENOUS
  Administered 2011-07-08: 15:00:00 via INTRAVENOUS
  Administered 2011-07-08: 0.399 mg via INTRAVENOUS

## 2011-07-06 MED ORDER — NALOXONE HCL 0.4 MG/ML IJ SOLN: 0.4000 mg | INTRAMUSCULAR | Status: DC | PRN

## 2011-07-06 NOTE — Progress Notes (Signed)
UR COMPLETED  

## 2011-07-06 NOTE — Progress Notes (Signed)
Orthopedic Tech Progress Note Patient Details:  [name] 17-Sep-1923 [id number]  Patient ID: [name], female   DOB: [birth year], 77 y.o.   MRN: [id number] Order completed by Storm Frisk, Alfons Sulkowski 07/06/2011, 3:06 PM

## 2011-07-06 NOTE — Progress Notes (Signed)
VASCULAR PROGRESS NOTE  SUBJECTIVE: Moderate discomfort. C/O itching.   PHYSICAL EXAM: Filed Vitals:   07/05/11 1215 07/05/11 1248 07/05/11 2215 07/06/11 0422  BP:  152/60 142/65 133/56  Pulse: 88 72 91 98  Temp: 98.2 F (36.8 C) 98.8 F (37.1 C) 98 F (36.7 C) 98 F (36.7 C)  TempSrc:  Oral  Oral  Resp: 22 16 18 19   Height:  4\' 11"  (1.499 m)    Weight:  109 lb 2 oz (49.5 kg)  110 lb 14.3 oz (50.3 kg)  SpO2: 99% 98% 97% 97%   Lungs: clear to auscultation Dressing dry  LABS: Lab Results  Component Value Date   WBC 10.5 07/06/2011   HGB 9.9* 07/06/2011   HCT 31.8* 07/06/2011   MCV 95.8 07/06/2011   PLT 230 07/06/2011   Lab Results  Component Value Date   CREATININE 2.55* 07/06/2011   Lab Results  Component Value Date   INR 1.05 10/04/2010   CBG (last 3)   Basename 07/05/11 0907 07/05/11 0613  GLUCAP 91 90     ASSESSMENT/PLAN: 1. 1 Day Post-Op s/p: L BKA 2. PCA for pain 3. Benadryl for itching  Waverly Ferrari, MD, FACS Beeper: [phone] 07/06/2011

## 2011-07-06 NOTE — Evaluation (Signed)
Occupational Therapy Evaluation Patient Details Name: [name] MRN: [id number] DOB: Nov 03, 1923 Today's Date: 07/06/2011  Problem List:  Patient Active Problem List  Diagnoses  . CKD (chronic kidney disease) stage 3, GFR 30-59 ml/min  . CAD (coronary artery disease)  . Cardiomyopathy, idiopathic  . Hypothyroid  . Anterior chest wall pain  . End stage renal disease  . Ischemia of extremity    Past Medical History:  Past Medical History  Diagnosis Date  . Coronary artery disease   . TIA (transient ischemic attack)   . Hypertension   . Shingles   . Stroke   . Thyroid disease   . Anemia   . CAD (coronary artery disease) 02/08/2011  . Cardiomyopathy, idiopathic 02/08/2011  . Chronic kidney disease   . Angina     Takes Isosorbide  . Unstable angina 02/08/2011  . Myocardial infarct     x 3 unsure of years  . Irregular heartbeat   . Hypothyroidism     Takes Levothryroxine  . Hypothyroid 02/08/2011  . Ulcer   . GERD (gastroesophageal reflux disease)     takes Protonix and zantac  . Hx of transient ischemic attack (TIA)   . Memory loss   . CHF (congestive heart failure)     Takes Lasix  . Gout     takes allopurinol  . Peripheral vascular disease     left lower  leg  . Diabetes mellitus     type 2 NIDDM x 2 years; no meds  . Chronic kidney disease (CKD), stage IV (severe)   . Arthritis   . Shortness of breath    Past Surgical History:  Past Surgical History  Procedure Date  . Cardiac catheterization   . Back surgery     [facility]  . Eye surgery     Left eye surgery; cataract removal  . Left bka 07/05/2011  . Av fistula placement     OT Assessment/Plan/Recommendation OT Assessment OT Recommendation/Assessment: Pt admitted for left BKA and presents with below problem list. Will benefit from skilled OT in the acute setting followed by CIR to maximize I with ADL and ADL mobility and allow for safe d/c home Patient will need skilled OT in the acute  care venue OT Problem List: Decreased strength;Decreased activity tolerance;Impaired balance (sitting and/or standing);Decreased safety awareness;Decreased knowledge of use of DME or AE;Pain;Decreased knowledge of precautions;Increased edema OT Therapy Diagnosis : Generalized weakness;Acute pain (Lt BKA) OT Plan OT Frequency: Min 2X/week OT Treatment/Interventions: Self-care/ADL training;DME and/or AE instruction;Therapeutic activities;Balance training;Patient/family education;Therapeutic exercise OT Recommendation Recommendations for Other Services: Rehab consult Follow Up Recommendations: Inpatient Rehab Equipment Recommended: Defer to next venue Individuals Consulted Consulted and Agree with Results and Recommendations: Patient;Family member/caregiver Family Member Consulted: dtr OT Goals Acute Rehab OT Goals OT Goal Formulation: With patient/family Time For Goal Achievement: 2 weeks ADL Goals Pt Will Perform Grooming: with min assist;Standing at sink;Sitting at sink ADL Goal: Grooming - Progress: Goal set today Pt Will Perform Upper Body Bathing: with set-up;with supervision;Sitting, chair;Sitting, edge of bed ADL Goal: Upper Body Bathing - Progress: Goal set today Pt Will Perform Lower Body Bathing: with min assist;Sit to stand from chair;Sit to stand from bed ADL Goal: Lower Body Bathing - Progress: Goal set today Pt Will Perform Upper Body Dressing: with set-up;with supervision;Sitting, bed;Sitting, chair ADL Goal: Upper Body Dressing - Progress: Goal set today Pt Will Transfer to Toilet: with min assist;Stand pivot transfer;with DME;3-in-1 ADL Goal: Toilet Transfer - Progress: Goal set  today Pt Will Perform Toileting - Clothing Manipulation: with min assist;Standing ADL Goal: Toileting - Clothing Manipulation - Progress: Goal set today Pt Will Perform Toileting - Hygiene: with supervision;with set-up;Leaning right and/or left on 3-in-1/toilet;Sitting on 3-in-1 or toilet ADL  Goal: Toileting - Hygiene - Progress: Goal set today Additional ADL Goal #1: Pt will be Mod I with all bed mobility in prep for ADLs ADL Goal: Additional Goal #1 - Progress: Goal set today  OT Evaluation Precautions/Restrictions  Precautions Precautions: Fall Required Braces or Orthoses:  (to receive brace for left leg) Restrictions Weight Bearing Restrictions: No LLE Weight Bearing: Non weight bearing Prior Functioning Home Living Lives With: Son (son has past h/o CVA but can help her) Available Help at Discharge: Family;Available 24 hours/day Type of Home: House Home Access: Stairs to enter [employer] of Steps: 4 Home Layout: One level Bathroom Shower/Tub: Engineer, manufacturing systems: Standard Bathroom Accessibility: No Home Adaptive Equipment: Bedside commode/3-in-1;Shower chair without back;Walker - rolling;Wheelchair - powered Prior Function Level of Independence: Independent with assistive device(s) Able to Take Stairs?: Yes Vocation: Retired  ADL ADL Eating/Feeding: Simulated;Minimal assistance Eating/Feeding Details (indicate cue type and reason): pt unable to balance unsupported sitting EOB. Required Min A Where Assessed - Eating/Feeding: Edge of bed Grooming: Performed;Moderate assistance Grooming Details (indicate cue type and reason): sitting unsupported EOB.  Where Assessed - Grooming: Sitting, bed Upper Body Bathing: Simulated;Moderate assistance Where Assessed - Upper Body Bathing: Sitting, bed Lower Body Bathing: Simulated;Maximal assistance Where Assessed - Lower Body Bathing: Sit to stand from bed Upper Body Dressing Details (indicate cue type and reason): gown Where Assessed - Upper Body Dressing: Sitting, bed Lower Body Dressing: Performed;+1 Total assistance Lower Body Dressing Details (indicate cue type and reason): don sock Where Assessed - Lower Body Dressing: Sit to stand from bed Toilet Transfer: Simulated;+2 Total assistance  (pt=25%) Toilet Transfer Details (indicate cue type and reason): simulated EOB to chair. +2total A (pt=30%) sit to stand from bed. Toilet Transfer Method: Stand pivot Toileting - Clothing Manipulation: Simulated;+1 Total assistance Where Assessed - Glass blower/designer Manipulation: Standing Toileting - Hygiene: Simulated;+1 Total assistance Where Assessed - Toileting Hygiene: Standing Tub/Shower Transfer: Not assessed Equipment Used: Rolling walker Ambulation Related to ADLs: NT ADL Comments: Pt doing well. Very painful, but willing to try to work through pain. Note pt is to be started on PCA. Attempted sit to stand with RW initially, but felt RW too much to attempt to do stand-pivot transfer with. Cognition Cognition Arousal/Alertness: Lethargic Attention: Impaired Current Attention Level: Sustained Attention - Other Comments: likely due to pain meds Orientation Level: Oriented X4 Sensation/Coordination Sensation Light Touch: Appears Intact (for bil UE's) Coordination Gross Motor Movements are Fluid and Coordinated: Yes Fine Motor Movements are Fluid and Coordinated: Not tested Extremity Assessment RUE Assessment RUE Assessment: Within Functional Limits (except shoulder flexion- limited by rotator cuff injury) LUE Assessment LUE Assessment: Within Functional Limits Mobility  Bed Mobility Bed Mobility: Yes Supine to Sit: 1: +2 Total assist;Patient percentage (comment);HOB elevated (Comment degrees);With rails (HOB 45 deg, pt 25% due to pain) Supine to Sit Details (indicate cue type and reason): pivoted instead of rolling for pain mgmt Sitting - Scoot to Edge of Bed: 2: Max assist Sitting - Scoot to [place] of Bed Details (indicate cue type and reason): pt with left lean and decreased wt-shifting inhibiting scoot, fear also a factor Transfers Sit to Stand: 1: +2 Total assist;Patient percentage (comment);From bed;With upper extremity assist (pt 30%) Sit to Stand Details (indicate  cue  type and reason): pt stood with right knee in extension but right hip flexed Stand to Sit: 1: +2 Total assist;Patient percentage (comment);To bed;With upper extremity assist (pt 30%) Stand to Sit Details: +2 assist to control sit to bed Exercises Amputee Exercises Knee Extension: AROM;Left;5 reps;Seated Straight Leg Raises: Other (comment) (unable) End of Session OT - End of Session Equipment Utilized During Treatment: Gait belt Activity Tolerance: Patient limited by fatigue Patient left: in chair;with call bell in reach;with family/visitor present Nurse Communication: Mobility status for transfers General Behavior During Session: Lethargic Cognition: WFL for tasks performed   Mcclain Shall 07/06/2011, 2:28 PM

## 2011-07-06 NOTE — Progress Notes (Signed)
Dressing changed to left upper arm on graft site. Wound bed yellow and green. No odor. No drainage noted. Wet to dry dressing applied per MD order.

## 2011-07-06 NOTE — Progress Notes (Signed)
Noted initial therapy evals today. I will follow up tomorrow to assess tolerance with therapy to determine rehab venue options of CIR vs SNF. Please call with any questions. [phone]

## 2011-07-06 NOTE — Evaluation (Signed)
Physical Therapy Evaluation Patient Details Name: [name] MRN: [id number] DOB: 1924/01/29 Today's Date: 07/06/2011  Problem List:  Patient Active Problem List  Diagnoses  . CKD (chronic kidney disease) stage 3, GFR 30-59 ml/min  . CAD (coronary artery disease)  . Cardiomyopathy, idiopathic  . Hypothyroid  . Anterior chest wall pain  . End stage renal disease  . Ischemia of extremity    Past Medical History:  Past Medical History  Diagnosis Date  . Coronary artery disease   . TIA (transient ischemic attack)   . Hypertension   . Shingles   . Stroke   . Thyroid disease   . Anemia   . CAD (coronary artery disease) 02/08/2011  . Cardiomyopathy, idiopathic 02/08/2011  . Chronic kidney disease   . Angina     Takes Isosorbide  . Unstable angina 02/08/2011  . Myocardial infarct     x 3 unsure of years  . Irregular heartbeat   . Hypothyroidism     Takes Levothryroxine  . Hypothyroid 02/08/2011  . Ulcer   . GERD (gastroesophageal reflux disease)     takes Protonix and zantac  . Hx of transient ischemic attack (TIA)   . Memory loss   . CHF (congestive heart failure)     Takes Lasix  . Gout     takes allopurinol  . Peripheral vascular disease     left lower  leg  . Diabetes mellitus     type 2 NIDDM x 2 years; no meds  . Chronic kidney disease (CKD), stage IV (severe)   . Arthritis   . Shortness of breath    Past Surgical History:  Past Surgical History  Procedure Date  . Cardiac catheterization   . Back surgery     [facility]  . Eye surgery     Left eye surgery; cataract removal  . Left bka 07/05/2011  . Av fistula placement     PT Assessment/Plan/Recommendation PT Assessment Clinical Impression Statement: Pt is 78 yo female s/p left BKA who is limited today by pain and lethargy.  Pt will benefit from skilled PT to maximize mobility as well as initiating ROM and strengthening to the left leg for future prosthesis.  Recommend CIR consult.  PT to  follow. PT Recommendation/Assessment: Patient will need skilled PT in the acute care venue PT Problem List: Decreased strength;Decreased range of motion;Decreased activity tolerance;Decreased balance;Decreased mobility;Decreased cognition;Decreased knowledge of use of DME;Pain;Impaired sensation Barriers to Discharge: Inaccessible home environment Barriers to Discharge Comments: 4 STE, discussed ramp being built for w/c access PT Therapy Diagnosis : Generalized weakness;Acute pain;Difficulty walking PT Plan PT Frequency: Min 3X/week PT Treatment/Interventions: DME instruction;Functional mobility training;Therapeutic activities;Therapeutic exercise;Balance training;Neuromuscular re-education;Patient/family education PT Recommendation Recommendations for Other Services: Rehab consult Follow Up Recommendations: Inpatient Rehab Equipment Recommended: Defer to next venue PT Goals  Acute Rehab PT Goals PT Goal Formulation: With patient/family Time For Goal Achievement: 2 weeks Pt will go Supine/Side to Sit: with modified independence PT Goal: Supine/Side to Sit - Progress: Goal set today Pt will Sit at [facility] of Bed: with modified independence;with no upper extremity support;3-5 min PT Goal: Sit at Edge Of Bed - Progress: Goal set today Pt will go Sit to Supine/Side: with modified independence PT Goal: Sit to Supine/Side - Progress: Goal set today Pt will go Sit to Stand: with mod assist;with upper extremity assist PT Goal: Sit to Stand - Progress: Goal set today Pt will go Stand to Sit: with mod assist;with upper extremity  assist PT Goal: Stand to Sit - Progress: Goal set today Pt will Transfer Bed to Chair/Chair to Bed: with mod assist PT Transfer Goal: Bed to Chair/Chair to Bed - Progress: Goal set today Pt will Perform Home Exercise Program: with supervision, verbal cues required/provided PT Goal: Perform Home Exercise Program - Progress: Goal set today  PT  Evaluation Precautions/Restrictions  Precautions Precautions: Fall Required Braces or Orthoses:  (to receive brace for left leg) Restrictions Weight Bearing Restrictions: No LLE Weight Bearing: Non weight bearing Prior Functioning  Home Living Lives With: Son (son has past h/o CVA but can help her) Available Help at Discharge: Family;Available 24 hours/day Type of Home: House Home Access: Stairs to enter [employer] of Steps: 4 Home Layout: One level Bathroom Shower/Tub: Engineer, manufacturing systems: Standard Bathroom Accessibility: No Home Adaptive Equipment: Bedside commode/3-in-1;Shower chair without back;Walker - rolling;Wheelchair - powered Prior Function Level of Independence: Independent with assistive device(s) Able to Take Stairs?: Yes Vocation: Retired Financial risk analyst Arousal/Alertness: Lethargic Attention: Impaired Current Attention Level: Sustained Attention - Other Comments: likely due to pain meds Orientation Level: Oriented X4 Sensation/Coordination Sensation Light Touch: Impaired by gross assessment (pt reports numbness left LE) Stereognosis: Not tested Hot/Cold: Not tested Proprioception: Impaired by gross assessment (LLE) Coordination Gross Motor Movements are Fluid and Coordinated: Yes Fine Motor Movements are Fluid and Coordinated: Not tested Extremity Assessment RUE Assessment RUE Assessment: Not tested (see OT eval, right shoulder compromised) LUE Assessment LUE Assessment: Not tested (see OT eval, HD graft left upper arm, painful) RLE Assessment RLE Assessment: Exceptions to [facility] RLE Strength RLE Overall Strength: Deficits RLE Overall Strength Comments: knee ext 3+/5, knee flex 3+/5, hip flex 3/5 (deficits may be due to pain) LLE Assessment LLE Assessment: Exceptions to WFL LLE AROM (degrees) Overall AROM Left Lower Extremity: Deficits;Due to pain LLE Overall AROM Comments: pt unable to fully extend left knee, 2/5 quad  noted, passive extension also unable to be obtained due to pain.  Discussed proper positioning as well as preservation of ROM and strength of left knee and hip for future prosthesis Mobility (including Balance) Bed Mobility Bed Mobility: Yes Supine to Sit: 1: +2 Total assist;Patient percentage (comment);HOB elevated (Comment degrees);With rails (HOB 45 deg, pt 25% due to pain) Supine to Sit Details (indicate cue type and reason): pivoted instead of rolling for pain mgmt Sitting - Scoot to Edge of Bed: 2: Max assist Sitting - Scoot to [employer] of Bed Details (indicate cue type and reason): pt with left lean and decreased wt-shifting inhibiting scoot, fear also a factor Transfers Transfers: Yes Sit to Stand: 1: +2 Total assist;Patient percentage (comment);From bed;With upper extremity assist (pt 30%) Sit to Stand Details (indicate cue type and reason): pt stood with right knee in extension but right hip flexed Stand to Sit: 1: +2 Total assist;Patient percentage (comment);To bed;With upper extremity assist (pt 30%) Stand to Sit Details: +2 assist to control sit to bed Squat Pivot Transfers: 1: +2 Total assist;Patient percentage (comment);With upper extremity assistance (pt 25%) Squat Pivot Transfer Details (indicate cue type and reason): pivot from bed to chair with OT in front and PT to left side of pt, pt with minimal participation due to pain and fear. Ambulation/Gait Ambulation/Gait: No Stairs: No Wheelchair Mobility Wheelchair Mobility: No  Posture/Postural Control Posture/Postural Control: No significant limitations Balance Balance Assessed: Yes Static Sitting Balance Static Sitting - Balance Support: Bilateral upper extremity supported (1 foot supported) Static Sitting - Level of Assistance: 4: Min assist Dynamic Sitting Balance  Dynamic Sitting - Balance Support: Right upper extremity supported (1 foot supported) Dynamic Sitting - Level of Assistance: 4: Min assist Exercise  Amputee  Exercises Knee Extension: AROM;Left;5 reps;Seated Straight Leg Raises: Other (comment) (unable) End of Session PT - End of Session Equipment Utilized During Treatment: Gait belt Activity Tolerance: Patient limited by pain;Patient limited by fatigue Patient left: in chair;with call bell in reach;with family/visitor present Nurse Communication: Mobility status for transfers General Behavior During Session: Lethargic Cognition: [facility] for tasks performed Lyanne Co, PT  Acute Rehab Services  [phone] Lyanne Co 07/06/2011, 10:26 AM

## 2011-07-06 NOTE — Progress Notes (Signed)
[facility] Progress Note  07/06/2011 7:23 AM POD 1  Subjective:  Family concerned about her LUE opening of left BVT incision.  States it is a little bigger than it ws.  Afebrile x 24hrs 90-150 systolic HR 60-90s reg 97% 2LO2NC Filed Vitals:   07/06/11 0422  BP: 133/56  Pulse: 98  Temp: 98 F (36.7 C)  Resp: 19    Physical Exam: Incisions:  Left BKA site with bandage in tact.  Dressing dry. Extremities:  LUE with opening of proximal incision.  No purulence noted.  There is some yellow exudate present.  Thrill is hard to palpate, but there is a bruit in her BVT. Other BVT incisions are healing nicely.  CBC    Component Value Date/Time   WBC 10.5 07/06/2011 0512   RBC 3.32* 07/06/2011 0512   HGB 9.9* 07/06/2011 0512   HCT 31.8* 07/06/2011 0512   PLT 230 07/06/2011 0512   MCV 95.8 07/06/2011 0512   MCH 29.8 07/06/2011 0512   MCHC 31.1 07/06/2011 0512   RDW 13.0 07/06/2011 0512   LYMPHSABS 2.0 06/27/2011 1213   MONOABS 0.5 06/27/2011 1213   EOSABS 0.4 06/27/2011 1213   BASOSABS 0.0 06/27/2011 1213    BMET    Component Value Date/Time   NA 139 07/06/2011 0512   K 5.0 07/06/2011 0512   CL 106 07/06/2011 0512   CO2 17* 07/06/2011 0512   GLUCOSE 79 07/06/2011 0512   BUN 51* 07/06/2011 0512   CREATININE 2.55* 07/06/2011 0512   CALCIUM 8.8 07/06/2011 0512   CALCIUM 9.6 10/11/2010 1605   GFRNONAA 16* 07/06/2011 0512   GFRAA 19* 07/06/2011 0512    INR    Component Value Date/Time   INR 1.05 10/04/2010 0139     Intake/Output Summary (Last 24 hours) at 07/06/11 0723 Last data filed at 07/06/11 0650  Gross per 24 hour  Intake   1960 ml  Output    900 ml  Net   1060 ml     Assessment/Plan:  78 y.o. female is s/p left BKA POD 1 -Left BKA site dressing is c/d/i.  Will take down dressing tomorrow. -left BVT incision with small opening with yellow exudate present.  There is no purulence noted and she has not had any fevers.  Will start wet to dry dressing changes bid. -the  thrill in the BVT is difficult to palpate.  She had her surgery on 05/27/11.  ? Duplex of fistula.  Will d/w [name].  Newton Pigg, PA-C [facility] [phone] 07/06/2011 7:23 AM

## 2011-07-06 NOTE — Consult Note (Signed)
Physical Medicine and Rehabilitation Consult Reason for Consult: Left below-knee amputation Referring Phsyician: Dr. Wynona Dove is an 78 y.o. female.   HPI: 78 year old right handed African American female with chronic kidney disease not yet on dialysis with graft in place, coronary artery disease, congestive heart failure, diabetes mellitus and peripheral vascular disease. Admitted April 16 with ischemic changes left lower extremity. Patient at baseline lives with her son and assistance as needed. She uses a motorized wheelchair as well as ambulate short distances. Left lower tremor he not felt to be salvageable due to peripheral vascular disease and ischemic changes and underwent left below-knee dictation April 16 per [name]. Placed on subcutaneous Lovenox for deep vein thrombosis prophylaxis. Postoperative pain management. Physical and occupational therapy evaluations are pending. M.D. has requested physical medicine rehabilitation consult to consider inpatient rehabilitation services  Review of Systems  HENT: Positive for hearing loss.   Gastrointestinal: Positive for constipation.  Musculoskeletal: Positive for myalgias, back pain and joint pain.  Neurological: Positive for weakness.  Psychiatric/Behavioral: Positive for memory loss.  All other systems reviewed and are negative.   Past Medical History  Diagnosis Date  . Coronary artery disease   . TIA (transient ischemic attack)   . Hypertension   . Shingles   . Stroke   . Thyroid disease   . Anemia   . CAD (coronary artery disease) 02/08/2011  . Cardiomyopathy, idiopathic 02/08/2011  . Chronic kidney disease   . Angina     Takes Isosorbide  . Unstable angina 02/08/2011  . Myocardial infarct     x 3 unsure of years  . Irregular heartbeat   . Hypothyroidism     Takes Levothryroxine  . Hypothyroid 02/08/2011  . Ulcer   . GERD (gastroesophageal reflux disease)     takes Protonix and zantac  . Hx of transient  ischemic attack (TIA)   . Memory loss   . CHF (congestive heart failure)     Takes Lasix  . Gout     takes allopurinol  . Peripheral vascular disease     left lower  leg  . Diabetes mellitus     type 2 NIDDM x 2 years; no meds  . Chronic kidney disease (CKD), stage IV (severe)   . Arthritis   . Shortness of breath    Past Surgical History  Procedure Date  . Cardiac catheterization   . Back surgery     [facility]  . Eye surgery     Left eye surgery; cataract removal  . Left bka 07/05/2011  . Av fistula placement    Family History  Problem Relation Age of Onset  . Cancer Sister     STOMACH  . Cancer Brother     BONE  . Anesthesia problems Neg Hx   . Hypotension Neg Hx   . Malignant hyperthermia Neg Hx   . Pseudochol deficiency Neg Hx    Social History:  reports that she quit smoking about 25 years ago. She has never used smokeless tobacco. She reports that she does not drink alcohol or use illicit drugs. Allergies:  Allergies  Allergen Reactions  . Aspirin Nausea And Vomiting    [name]    Medications Prior to Admission  Medication Dose Route Frequency Provider Last Rate Last Dose  . 0.9 %  sodium chloride infusion  250 mL Intravenous PRN Marlowe Shores, PA      .  0.9 % NaCl with KCl 20 mEq/ L  infusion   Intravenous Continuous Amelia Jo [place], [place] 75 mL/hr at 07/06/11 1610    . acetaminophen (TYLENOL) tablet 325-650 mg  325-650 mg Oral Q4H PRN Marlowe Shores, PA       Or  . acetaminophen (TYLENOL) suppository 325-650 mg  325-650 mg Rectal Q4H PRN Marlowe Shores, PA      . alum & mag hydroxide-simeth (MAALOX/MYLANTA) 200-200-20 MG/5ML suspension 15-30 mL  15-30 mL Oral Q2H PRN Marlowe Shores, PA      . atorvastatin (LIPITOR) tablet 40 mg  40 mg Oral Daily Amelia Jo [place], [place]      . bisacodyl (DULCOLAX) suppository 10 mg  10 mg Rectal Daily PRN Marlowe Shores, PA      . calcitRIOL  (ROCALTROL) capsule 0.25 mcg  0.25 mcg Oral BID Marlowe Shores, PA   0.25 mcg at 07/05/11 2204  . carvedilol (COREG) tablet 6.25 mg  6.25 mg Oral BID WC Amelia Jo [place], PA      . ceFAZolin (ANCEF) IVPB 1 g/50 mL premix  1 g Intravenous Once Chuck Hint, MD   1 g at 07/05/11 0726  . cefUROXime (ZINACEF) 1.5 g in dextrose 5 % 50 mL IVPB  1.5 g Intravenous Q12H Amelia Jo Rocz[place]ak, PA   1.5 g at 07/06/11 0145  . clopidogrel (PLAVIX) tablet 75 mg  75 mg Oral QPC breakfast Marlowe Shores, [place]      . docusate sodium (COLACE) capsule 100 mg  100 mg Oral Daily Marlowe Shores, [place]      . enoxaparin (LOVENOX) injection 30 mg  30 mg Subcutaneous Q24H Regina J [place], [place]      . furosemide (LASIX) tablet 40 mg  40 mg Oral BID Marlowe Shores, PA      . guaiFENesin-dextromethorphan (ROBITUSSIN DM) 100-10 MG/5ML syrup 15 mL  15 mL Oral Q4H PRN Marlowe Shores, PA      . hydrALAZINE (APRESOLINE) injection 10 mg  10 mg Intravenous Q2H PRN Marlowe Shores, [place]      . HYDROmorphone (DILAUDID) injection 0.5-1 mg  0.5-1 mg Intravenous Q2H PRN Marlowe Shores, PA   1 mg at 07/06/11 0142  . isosorbide mononitrate (IMDUR) 24 hr tablet 60 mg  60 mg Oral Daily Marlowe Shores, [place]      . labetalol (NORMODYNE,TRANDATE) injection 10 mg  10 mg Intravenous Q2H PRN Marlowe Shores, PA      . levothyroxine (SYNTHROID, LEVOTHROID) tablet 125 mcg  125 mcg Oral QAC breakfast Marlowe Shores, [place]      . metoprolol (LOPRESSOR) injection 2-5 mg  2-5 mg Intravenous Q2H PRN Marlowe Shores, PA      . nitroGLYCERIN (NITROSTAT) SL tablet 0.4 mg  0.4 mg Sublingual Q5 min PRN Marlowe Shores, PA      . ondansetron [facility]) injection 4 mg  4 mg Intravenous Q6H PRN Marlowe Shores, PA      . oxyCODONE-acetaminophen (PERCOCET) 5-325 MG per tablet 1-2 tablet  1-2 tablet Oral Q4H PRN Marlowe Shores, PA   1 tablet at 07/06/11 0023  . pantoprazole (PROTONIX) EC tablet 40 mg  40 mg Oral Daily Regina J  [place], [place]      . phenol (CHLORASEPTIC) mouth spray 1 spray  1 spray Mouth/Throat PRN Amelia Jo Roczniak, PA      . polyethylene glycol (MIRALAX / GLYCOLAX) packet 17 g  17  g Oral Daily PRN Marlowe Shores, PA      . potassium chloride SA (K-DUR,KLOR-CON) CR tablet 20-40 mEq  20-40 mEq Oral Daily PRN Amelia Jo Rocz[place]ak, PA      . sodium chloride 0.9 % injection 3 mL  3 mL Intravenous Q12H Regina J [place], [place]      . sodium chloride 0.9 % injection 3 mL  3 mL Intravenous PRN Marlowe Shores, PA      . sodium phosphate (FLEET) 7-19 GM/118ML enema 1 enema  1 enema Rectal Once PRN Marlowe Shores, PA      . DISCONTD: 0.9 %  sodium chloride infusion   Intravenous Continuous Chuck Hint, MD      . DISCONTD: 0.9 % irrigation (POUR BTL)    PRN Chuck Hint, MD   1,000 mL at 07/05/11 0758  . DISCONTD: bacitracin ointment    PRN Chuck Hint, MD   1 application at 07/05/11 [phone]  . DISCONTD: HYDROmorphone (DILAUDID) injection 0.25-0.5 mg  0.25-0.5 mg Intravenous Q5 min PRN Aubery Lapping, MD   0.5 mg at 07/05/11 1136  . DISCONTD: morphine 2 MG/ML injection 2.426 mg  0.05 mg/kg Intravenous Q10 min PRN Aubery Lapping, MD      . DISCONTD: ondansetron [facility]) injection 4 mg  4 mg Intravenous Once PRN Aubery Lapping, MD       Medications Prior to Admission  Medication Sig Dispense Refill  . allopurinol (ZYLOPRIM) 100 MG tablet Take 100 mg by mouth daily.       Marland Kitchen atorvastatin (LIPITOR) 40 MG tablet Take 40 mg by mouth daily.        . calcitRIOL (ROCALTROL) 0.25 MCG capsule Take 0.25 mcg by mouth 2 (two) times daily.       . carvedilol (COREG) 6.25 MG tablet Take 6.25 mg by mouth 2 (two) times daily with a meal.       . furosemide (LASIX) 40 MG tablet Take 40 mg by mouth 2 (two) times daily.       Marland Kitchen gabapentin (NEURONTIN) 300 MG capsule Take 300 mg by mouth 2 (two) times daily.      Marland Kitchen HYDROcodone-acetaminophen (VICODIN) 5-500 MG per tablet Take 1-2 tablets by mouth  every 6 (six) hours as needed. For pain      . isosorbide mononitrate (IMDUR) 60 MG 24 hr tablet Take 60 mg by mouth daily.       Marland Kitchen levothyroxine (SYNTHROID, LEVOTHROID) 125 MCG tablet Take 1 tablet (125 mcg total) by mouth daily.  30 tablet  11  . pantoprazole (PROTONIX) 40 MG tablet Take 40 mg by mouth daily.       . ranitidine (ZANTAC) 150 MG tablet Take 150 mg by mouth daily as needed. For heart burn.      Marland Kitchen acetaminophen (TYLENOL) 500 MG tablet Take 500 mg by mouth as needed. For pain.       . nitroGLYCERIN (NITROSTAT) 0.4 MG SL tablet Place 0.4 mg under the tongue every 5 (five) minutes as needed. For chest pain        Home:    Functional History:   Functional Status:  Mobility:          ADL:    Cognition: Cognition Orientation Level: Oriented X4 Cognition Orientation Level: Oriented X4  Blood pressure 133/56, pulse 98, temperature 98 F (36.7 C), temperature source Oral, resp. rate 19, height 4\' 11"  (1.499 m), weight 50.3 kg (110 lb 14.3 oz), SpO2  97.00%. Physical Exam  Vitals reviewed. Constitutional:       Frail elderly female.  HENT:  Head: Normocephalic.  Neck: No thyromegaly present.  Cardiovascular: Regular rhythm.   Pulmonary/Chest: Breath sounds normal. She has no wheezes.  Abdominal: She exhibits no distension. There is no tenderness.  Neurological:       Patient is lethargic but arousable. She answers basic questions with some subtle cues. Follows one-two step commands. Her daughter was at bedside.  Skin:       Surgical site dressed   the patient just received IV pain medicine. She is now mainly somnolent and difficult to arouse. She's had one therapy session and required maximum to total assist to get from bed to recliner chair. She has difficulty following commands. Manual muscle testing cannot be formally assessed due to her mental status. She does have some antigravity strength in the right upper extremity as well as left upper extremity but once  again full testing is not possible Right shoulder has pain with passive range of motion when I get up to about 45 of abduction and forward flexion there is reduced range of motion  Results for orders placed during the hospital encounter of 07/05/11 (from the past 24 hour(s))  GLUCOSE, CAPILLARY     Status: Normal   Collection Time   07/05/11  6:13 AM      Component Value Range   Glucose-Capillary 90  70 - 99 (mg/dL)  GLUCOSE, CAPILLARY     Status: Normal   Collection Time   07/05/11  9:07 AM      Component Value Range   Glucose-Capillary 91  70 - 99 (mg/dL)  CREATININE, SERUM     Status: Abnormal   Collection Time   07/05/11  3:05 PM      Component Value Range   Creatinine, Ser 2.66 (*) 0.50 - 1.10 (mg/dL)   GFR calc non Af Amer 15 (*) >90 (mL/min)   GFR calc Af Amer 18 (*) >90 (mL/min)  CBC     Status: Abnormal   Collection Time   07/05/11  4:26 PM      Component Value Range   WBC 8.8  4.0 - 10.5 (K/uL)   RBC 3.40 (*) 3.87 - 5.11 (MIL/uL)   Hemoglobin 10.4 (*) 12.0 - 15.0 (g/dL)   HCT 45.4 (*) 09.8 - 46.0 (%)   MCV 96.8  78.0 - 100.0 (fL)   MCH 30.6  26.0 - 34.0 (pg)   MCHC 31.6  30.0 - 36.0 (g/dL)   RDW 11.9  14.7 - 82.9 (%)   Platelets 255  150 - 400 (K/uL)  BASIC METABOLIC PANEL     Status: Abnormal   Collection Time   07/06/11  5:12 AM      Component Value Range   Sodium 139  135 - 145 (mEq/L)   Potassium 5.0  3.5 - 5.1 (mEq/L)   Chloride 106  96 - 112 (mEq/L)   CO2 17 (*) 19 - 32 (mEq/L)   Glucose, Bld 79  70 - 99 (mg/dL)   BUN 51 (*) 6 - 23 (mg/dL)   Creatinine, Ser 5.62 (*) 0.50 - 1.10 (mg/dL)   Calcium 8.8  8.4 - 13.0 (mg/dL)   GFR calc non Af Amer 16 (*) >90 (mL/min)   GFR calc Af Amer 19 (*) >90 (mL/min)  CBC     Status: Abnormal   Collection Time   07/06/11  5:12 AM      Component Value Range  WBC 10.5  4.0 - 10.5 (K/uL)   RBC 3.32 (*) 3.87 - 5.11 (MIL/uL)   Hemoglobin 9.9 (*) 12.0 - 15.0 (g/dL)   HCT 16.1 (*) 09.6 - 46.0 (%)   MCV 95.8  78.0 - 100.0  (fL)   MCH 29.8  26.0 - 34.0 (pg)   MCHC 31.1  30.0 - 36.0 (g/dL)   RDW 04.5  40.9 - 81.1 (%)   Platelets 230  150 - 400 (K/uL)   No results found.  Assessment/Plan: Diagnosis: Left below-knee amputation postoperative day #1 1. Does the need for close, 24 hr/day medical supervision in concert with the patient's rehab needs make it unreasonable for this patient to be served in a less intensive setting? Potentially 2. Co-Morbidities requiring supervision/potential complications: End-stage renal disease, coronary artery disease, cardiomyopathy with history of congestive heart failure, severe deconditioning 3. Due to bowel management, safety, skin/wound care, disease management, medication administration, pain management and patient education, does the patient require 24 hr/day rehab nursing? Potentially 4. Does the patient require coordinated care of a physician, rehab nurse, PT (0.5-1 hrs/day, 5 days/week) and OT (0.5-1 hrs/day, 5 days/week) to address physical and functional deficits in the context of the above medical diagnosis(es)? Potentially Addressing deficits in the following areas: balance, endurance, locomotion, strength, transferring, bowel/bladder control, bathing and dressing 5. Can the patient actively participate in an intensive therapy program of at least 3 hrs of therapy per day at least 5 days per week? No 6. The potential for patient to make measurable gains while on inpatient rehab is poor 7. Anticipated functional outcomes upon discharge from inpatient rehab are mod assist mobility with PT, mod assist ADLs with OT, not applicable with SLP. 8. Estimated rehab length of stay to reach the above functional goals is: Not applicable 9. Does the patient have adequate social supports to accommodate these discharge functional goals? Potentially 10. Anticipated D/C setting: Home versus [facility] depending on family's ability to care 11. Anticipated post D/C treatments: Home health versus  SNF 12. Overall Rehab/Functional Prognosis: fair  RECOMMENDATIONS: This patient's condition is appropriate for continued rehabilitative care in the following setting: Most likely will require SNF given her poor exercise tolerance and endurance Patient has agreed to participate in recommended program. Potentially Note that insurance prior authorization may be required for reimbursement for recommended care.  Comment:   ANGIULLI,DANIEL J. 07/06/2011

## 2011-07-07 LAB — BASIC METABOLIC PANEL
Calcium: 8.8 mg/dL (ref 8.4–10.5)
Chloride: 108 mEq/L (ref 96–112)
Creatinine, Ser: 2.09 mg/dL — ABNORMAL HIGH (ref 0.50–1.10)
GFR calc Af Amer: 24 mL/min — ABNORMAL LOW (ref >90)
GFR calc non Af Amer: 20 mL/min — ABNORMAL LOW (ref >90)

## 2011-07-07 LAB — CBC
MCHC: 32 g/dL (ref 30.0–36.0)
Platelets: 223 10*3/uL (ref 150–400)
RDW: 13.2 % (ref 11.5–15.5)
WBC: 13 10*3/uL — ABNORMAL HIGH (ref 4.0–10.5)

## 2011-07-07 MED ORDER — SODIUM POLYSTYRENE SULFONATE 15 GM/60ML PO SUSP
15.0000 g | Freq: Once | ORAL | Status: AC
  Administered 2011-07-07: 15 g via ORAL
  Filled 2011-07-07: qty 60

## 2011-07-07 MED ORDER — SODIUM CHLORIDE 0.9 % IV SOLN
INTRAVENOUS | Status: DC
  Administered 2011-07-07 – 2011-07-09 (×4): via INTRAVENOUS

## 2011-07-07 NOTE — Progress Notes (Addendum)
[facility] Progress Note  07/07/2011 11:28 AM POD 2  Subjective:  Still with pain  Tm 99.7 now 98.7 Filed Vitals:   07/07/11 0850  BP: 150/73  Pulse: 91  Temp: 98.7 F (37.1 C)  Resp: 18    Physical Exam: Incisions:  C/d/i with staples in place. No significant swelling. Extremities:  Does not have good movement of LLE without pain.  CBC    Component Value Date/Time   WBC 13.0* 07/07/2011 0615   RBC 3.48* 07/07/2011 0615   HGB 10.6* 07/07/2011 0615   HCT 33.1* 07/07/2011 0615   PLT 223 07/07/2011 0615   MCV 95.1 07/07/2011 0615   MCH 30.5 07/07/2011 0615   MCHC 32.0 07/07/2011 0615   RDW 13.2 07/07/2011 0615   LYMPHSABS 2.0 06/27/2011 1213   MONOABS 0.5 06/27/2011 1213   EOSABS 0.4 06/27/2011 1213   BASOSABS 0.0 06/27/2011 1213    BMET    Component Value Date/Time   NA 142 07/07/2011 0615   K 5.7* 07/07/2011 0615   CL 108 07/07/2011 0615   CO2 12* 07/07/2011 0615   GLUCOSE 74 07/07/2011 0615   BUN 44* 07/07/2011 0615   CREATININE 2.09* 07/07/2011 0615   CALCIUM 8.8 07/07/2011 0615   CALCIUM 9.6 10/11/2010 1605   GFRNONAA 20* 07/07/2011 0615   GFRAA 24* 07/07/2011 0615    INR    Component Value Date/Time   INR 1.05 10/04/2010 0139     Intake/Output Summary (Last 24 hours) at 07/07/11 1128 Last data filed at 07/07/11 1047  Gross per 24 hour  Intake 1110.5 ml  Output    600 ml  Net  510.5 ml     Assessment/Plan:  78 y.o. female is s/p left BKA POD 2 -will leave dressing off until [name] sees incision then re-wrap. -continue PCA. -continue OT/PT -Cr improved today, however K+ 5.7.  Pt is on lasix 40 mg po bid. Pt's IVF has K+ 10mEq-will change to IVF without potassium. kayexalate  15gm x 1 -change diet to renal diet  Check labs in am   Newton Pigg, [place] [facility] [phone] 07/07/2011 11:28 AM  Left BKA looks fine. Begin dressing changes. Being evaluated for rehab.  BVT wound stable.  Di Kindle. Edilia Bo,  MD, FACS Beeper [phone] 07/07/2011

## 2011-07-07 NOTE — Progress Notes (Signed)
I met with patient and 3 children at bedside. I discussed options of inpt rehab vs SNF rehab depending on her ability to work extensively with therapy. I will follow up on her progress tomorrow. She will likely need [phone] with questions

## 2011-07-08 LAB — CBC
HCT: 31.5 % — ABNORMAL LOW (ref 36.0–46.0)
Hemoglobin: 9.9 g/dL — ABNORMAL LOW (ref 12.0–15.0)
MCH: 29.9 pg (ref 26.0–34.0)
MCHC: 31.4 g/dL (ref 30.0–36.0)
MCV: 95.2 fL (ref 78.0–100.0)
RBC: 3.31 MIL/uL — ABNORMAL LOW (ref 3.87–5.11)

## 2011-07-08 LAB — BASIC METABOLIC PANEL
BUN: 42 mg/dL — ABNORMAL HIGH (ref 6–23)
CO2: 19 mEq/L (ref 19–32)
Calcium: 8.3 mg/dL — ABNORMAL LOW (ref 8.4–10.5)
GFR calc non Af Amer: 24 mL/min — ABNORMAL LOW (ref >90)
Glucose, Bld: 87 mg/dL (ref 70–99)

## 2011-07-08 MED ORDER — HYDROMORPHONE 0.3 MG/ML IV SOLN
INTRAVENOUS | Status: AC
  Filled 2011-07-08: qty 25

## 2011-07-08 NOTE — Progress Notes (Signed)
Dressing changed per MD order. Pt tolerated well. Will continue to monitor. Dion Saucier

## 2011-07-08 NOTE — Progress Notes (Signed)
Physical Therapy Treatment Patient Details Name: [name] MRN: [id number] DOB: 1924-01-13 Today's Date: 07/08/2011 Time: 1100-1147 PT Time Calculation (min): 47 min  PT Assessment / Plan / Recommendation Comments on Treatment Session  Patient s/p left amputation LE with decr mobility secondary to pain and weakness.  Patient has a lot of difficulty with transfers taking incr time.  Will need Rehab.    Follow Up Recommendations  Inpatient Rehab;Supervision/Assistance - 24 hour    Equipment Recommendations  Defer to next venue    Frequency Min 3X/week    Precautions / Restrictions Precautions Precautions: Fall Restrictions Weight Bearing Restrictions: No LLE Weight Bearing: Non weight bearing   Pertinent Vitals/Pain VSS/Pain 10/10    Mobility  Bed Mobility Bed Mobility: Rolling Right;Right Sidelying to Sit;Sitting - Scoot to [place] of Bed Rolling Right: 3: Mod assist;With rail Right Sidelying to Sit: 3: Mod assist;With rails;HOB elevated (HOB 20 degrees) Sitting - Scoot to Edge of Bed: 4: Min assist;With rail Details for Bed Mobility Assistance: Used pad to get patient to EOB secondary to patient's pain.  PT had to hold residual limb entire session at 90 degrees per patient report.   Transfers Transfers: Sit to Stand;Stand to Sit;Squat Pivot Transfers Sit to Stand: 1: +2 Total assist;With upper extremity assist;With armrests;From chair/3-in-1 Sit to Stand: Patient Percentage: 40% Stand to Sit: 1: +2 Total assist;With upper extremity assist;With armrests;To chair/3-in-1 Stand to Sit: Patient Percentage: 40% Squat Pivot Transfers: 1: +2 Total assist Squat Pivot Transfers: Patient Percentage: 50% Details for Transfer Assistance: Needed assist and cues to achieve sit to stand.  Patient had a lot of difficulty clearing bottom off of bed.  Tried multiple attempts at getting up.  The best way that PT and patient tried was with patient holding onto PT/Techs UEs and PT/Tech used gait  belt to assist.  Patient was never able to fully extend right LE at knee which was why transfers were difficult.  Transferred patient to chair and then to [facility] commode and back to chair.  Patient needs a lot of cues and assist for safety and technique overall.   Ambulation/Gait Ambulation/Gait Assistance: Not tested (comment) Stairs: No Wheelchair Mobility Wheelchair Mobility: No         PT Goals Acute Rehab PT Goals Time For Goal Achievement: 07/20/11 Potential to Achieve Goals: Good PT Goal: Supine/Side to Sit - Progress: Progressing toward goal PT Goal: Sit at Edge Of Bed - Progress: Progressing toward goal PT Goal: Sit to Supine/Side - Progress: Progressing toward goal PT Goal: Sit to Stand - Progress: Progressing toward goal PT Goal: Stand to Sit - Progress: Progressing toward goal PT Transfer Goal: Bed to Chair/Chair to Bed - Progress: Progressing toward goal  Visit Information  Last PT Received On: 07/08/11 Assistance Needed: +2    Subjective Data  Subjective: " My leg hurts so bad."   Cognition  Overall Cognitive Status: Appears within functional limits for tasks assessed/performed Arousal/Alertness: Awake/alert Orientation Level: Appears intact for tasks assessed Behavior During Session: [facility] for tasks performed Current Attention Level: Sustained    Balance  Static Sitting Balance Static Sitting - Balance Support: Bilateral upper extremity supported;Feet supported Static Sitting - Level of Assistance: 5: Stand by assistance  End of Session Patient left in chair with call bell in reach.  Nursing aware.      INGOLD,Kamal Jurgens 07/08/2011, 1:15 PM

## 2011-07-08 NOTE — Progress Notes (Signed)
   CARE MANAGEMENT NOTE 07/08/2011  Patient:  [name], [name]   Account Number:  [id number]  Date Initiated:  07/08/2011  Documentation initiated by:  Clemencia Helzer  Subjective/Objective Assessment:   Referral for [facility] needs.     Action/Plan:   Review chart and spoke with pt RN and this pt most likely will needs inpt rehab or SNF.   Anticipated DC Date:  07/11/2011   Anticipated DC Plan:  SKILLED NURSING FACILITY         Choice offered to / List presented to:             Status of service:  In process, will continue to follow Medicare Important Message given?   (If response is "NO", the following Medicare IM given date fields will be blank) Date Medicare IM given:   Date Additional Medicare IM given:    Discharge Disposition:    Per UR Regulation:    If discussed at Long Length of Stay Meetings, dates discussed:    Comments:

## 2011-07-08 NOTE — Progress Notes (Addendum)
[facility] Progress Note  07/08/2011 9:52 AM POD 3  Subjective:  Feels better today.  Afebrile x 24 hours otherwise VSS  95%RA Filed Vitals:   07/08/11 0756  BP:   Pulse:   Temp:   Resp: 22    Physical Exam:  Extremities:  Dressing is intact and dry.  Pt's pain is better today.  CBC    Component Value Date/Time   WBC 10.1 07/08/2011 0535   RBC 3.31* 07/08/2011 0535   HGB 9.9* 07/08/2011 0535   HCT 31.5* 07/08/2011 0535   PLT 256 07/08/2011 0535   MCV 95.2 07/08/2011 0535   MCH 29.9 07/08/2011 0535   MCHC 31.4 07/08/2011 0535   RDW 13.3 07/08/2011 0535   LYMPHSABS 2.0 06/27/2011 1213   MONOABS 0.5 06/27/2011 1213   EOSABS 0.4 06/27/2011 1213   BASOSABS 0.0 06/27/2011 1213    BMET    Component Value Date/Time   NA 142 07/08/2011 0535   K 4.6 07/08/2011 0535   CL 111 07/08/2011 0535   CO2 19 07/08/2011 0535   GLUCOSE 87 07/08/2011 0535   BUN 42* 07/08/2011 0535   CREATININE 1.82* 07/08/2011 0535   CALCIUM 8.3* 07/08/2011 0535   CALCIUM 9.6 10/11/2010 1605   GFRNONAA 24* 07/08/2011 0535   GFRAA 28* 07/08/2011 0535    INR    Component Value Date/Time   INR 1.05 10/04/2010 0139     Intake/Output Summary (Last 24 hours) at 07/08/11 0952 Last data filed at 07/08/11 0500  Gross per 24 hour  Intake 1566.25 ml  Output   1000 ml  Net 566.25 ml     Assessment/Plan:  78 y.o. female is s/p left below knee amputation  POD 3 -K+ much improved today as well as Creatinine. -acute surgical blood loss anemia-Hgb down slightly, but pt tolerating. -pain much improved today -will involve social work for discharge planning.  Newton Pigg, PA-C [facility] [phone] 07/08/2011 9:52 AM  Agree with above. Pain adequately controlled. SNF vs CIR Physical therapy recommends rehabilitation. Continue physical therapy and dressing changes.  Di Kindle. Edilia Bo, MD, FACS Beeper [phone] 07/08/2011

## 2011-07-09 NOTE — Progress Notes (Addendum)
[facility] [place]  Postoperative Visit - Amputation  Date of Surgery: 07/05/2011 Procedure(s): AMPUTATION BELOW KNEE Left Surgeon: Surgeon(s): Chuck Hint, MD POD: 4 Days Post-Op  Subjective [name] is a 78 y.o. female who is S/P Left Procedure(s): AMPUTATION BELOW KNEE.  Pt.denies increased pain in the stump. The patient notes pain is well controlled. Pt. C/O some soreness in right heel  Significant Diagnostic Studies: CBC Lab Results  Component Value Date   WBC 10.1 07/08/2011   HGB 9.9* 07/08/2011   HCT 31.5* 07/08/2011   MCV 95.2 07/08/2011   PLT 256 07/08/2011    BMET    Component Value Date/Time   NA 142 07/08/2011 0535   K 4.6 07/08/2011 0535   CL 111 07/08/2011 0535   CO2 19 07/08/2011 0535   GLUCOSE 87 07/08/2011 0535   BUN 42* 07/08/2011 0535   CREATININE 1.82* 07/08/2011 0535   CALCIUM 8.3* 07/08/2011 0535   CALCIUM 9.6 10/11/2010 1605   GFRNONAA 24* 07/08/2011 0535   GFRAA 28* 07/08/2011 0535    COAG Lab Results  Component Value Date   INR 1.05 10/04/2010   INR 1.1 09/28/2007   INR 1.0 09/23/2007   No results found for this basename: PTT     Intake/Output Summary (Last 24 hours) at 07/09/11 0840 Last data filed at 07/09/11 0500  Gross per 24 hour  Intake    750 ml  Output    550 ml  Net    200 ml   No data found.    Physical Examination  BP Readings from Last 3 Encounters:  07/09/11 164/72  07/09/11 164/72  07/01/11 144/74   Temp Readings from Last 3 Encounters:  07/09/11 99 F (37.2 C) Oral  07/09/11 99 F (37.2 C) Oral  07/01/11 97.4 F (36.3 C) Oral   SpO2 Readings from Last 3 Encounters:  07/09/11 97%  07/09/11 97%  07/01/11 95%   Pulse Readings from Last 3 Encounters:  07/09/11 82  07/09/11 82  06/30/11 61    Pt is A&Ox3  WDWN female with no complaints  Left amputation wound is wrapped in limb guard.  RLE - foot warm. Lateral heel soft /tender - no breakdown Good DP and PT doppler  signal on right  Assessment/plan:  [name] is a 78 y.o. female who is s/p Left  AMPUTATION BELOW KNEE  The patient's stump is viable.  Post-op acute blood loss anemia - stable, asymptomatic  Right LE well perfused- soreness on heel - elevate and assess daily for breakdown  Follow-up 4 weeks from surgery for staple removal if out of rehab  Rehab vs SNF - Monday - CIR to re-evaluate  DC PCA  PT/OT  ROCZNIAK,REGINA J 8:40 AM 07/09/2011 [phone]  BKA healing well Awaiting SNF vs Rehab Continue dry dressings  Fabienne Bruns, MD [facility] [place] Office: [phone] Pager: [phone]

## 2011-07-10 NOTE — Progress Notes (Signed)
[facility] [place]  Subjective  - less pain in stump  Objective 168/66 86 97.9 F (36.6 C) (Oral) 20 94%  Intake/Output Summary (Last 24 hours) at 07/10/11 1007 Last data filed at 07/10/11 0809  Gross per 24 hour  Intake    660 ml  Output    900 ml  Net   -240 ml   BKA dressing clean no obvious drainage  Assessment/Planning: SNF vs rehab  [name] E 07/10/2011 10:07 AM

## 2011-07-10 NOTE — Progress Notes (Addendum)
Clinical Social Work Department CLINICAL SOCIAL WORK PLACEMENT NOTE 07/10/2011  Patient:  LAHELA, WOODIN  Account Number:  [id number] Admit date:  07/05/2011  Clinical Social Worker:  Skip Mayer  Date/time:  07/10/2011 10:15 AM  Clinical Social Work is seeking post-discharge placement for this patient at the following level of care:   SKILLED NURSING   (*CSW will update this form in Epic as items are completed)   07/10/2011  Patient/family provided with [facility] Department of Clinical Social Work's list of facilities offering this level of care within the geographic area requested by the patient (or if unable, by the patient's family).  07/10/2011  Patient/family informed of their freedom to choose among providers that offer the needed level of care, that participate in Medicare, Medicaid or managed care program needed by the patient, have an available bed and are willing to accept the patient.  07/10/2011  Patient/family informed of [facility]' ownership interest in [facility], as well as of the fact that they are under no obligation to receive care at this facility.  PASARR submitted to EDS on 07/10/2011 PASARR number received from EDS on   FL2 transmitted to all facilities in geographic area requested by pt/family on  07/10/2011 FL2 transmitted to all facilities within larger geographic area on   Patient informed that his/her managed care company has contracts with or will negotiate with  certain facilities, including the following:     Patient/family informed of bed offers received:  07/11/11 Patient chooses bed at [facility]  Physician recommends and patient chooses bed at  [facility]  Patient to be transferred to [facility]  on  07/12/11 Patient to be transferred to facility by [facility]  The following physician request were entered in Epic:   Additional Comments:  Dellie Burns, MSW, LCSWA [phone] (weekend)  Dede Query, MSW,  Theresia Majors [phone]

## 2011-07-10 NOTE — Progress Notes (Signed)
Clinical Social Work Department BRIEF PSYCHOSOCIAL ASSESSMENT 07/10/2011  Patient:  [name], [name]     Account Number:  [id number]     Admit date:  07/05/2011  Clinical Social Worker:  Skip Mayer  Date/Time:  07/10/2011 10:15 AM  Referred by:  Physician  Date Referred:  07/09/2011 Referred for  SNF Placement   Other Referral:   Interview type:  Patient Other interview type:    PSYCHOSOCIAL DATA Living Status:  FAMILY Admitted from facility:   Level of care:   Primary support name:  Mathis Fare Primary support relationship to patient:  CHILD, ADULT Degree of support available:   Adequate, per pt    CURRENT CONCERNS Current Concerns  Post-Acute Placement   Other Concerns:    SOCIAL WORK ASSESSMENT / PLAN CSW met with pt re: CIR vs. SNF. Pt reports she lives with her son who is able to provide 24hr assist. Pt also reports a dtr who is able to provided assist as needed. CSW explained placement process and answered questions. Pt reports plan is for CIR; however, agreeable to SNF search as b/u plan. CSW will complete FL2 and initiate SNF search. Will hold off on initiating Blue Medicare Auth as it may interfere with CIR auth. Weekday CSW to f/u on possible CIR admission and initiate insurance auth if needed.   Assessment/plan status:  Information/Referral to [facility] Other assessment/ plan:   Information/referral to community resources:   SNF    PATIENT'S/FAMILY'S RESPONSE TO PLAN OF CARE: Pt reports agreeable to SNF search as b/u plan only and preference is for CIR.        Dellie Burns, MSW, [place] [phone] (weekend)

## 2011-07-11 ENCOUNTER — Inpatient Hospital Stay (HOSPITAL_COMMUNITY): Payer: Medicare Other

## 2011-07-11 LAB — BASIC METABOLIC PANEL
CO2: 21 mEq/L (ref 19–32)
Calcium: 9 mg/dL (ref 8.4–10.5)
Creatinine, Ser: 1.72 mg/dL — ABNORMAL HIGH (ref 0.50–1.10)
GFR calc non Af Amer: 26 mL/min — ABNORMAL LOW (ref >90)
Glucose, Bld: 156 mg/dL — ABNORMAL HIGH (ref 70–99)
Sodium: 138 mEq/L (ref 135–145)

## 2011-07-11 LAB — CBC
Hemoglobin: 10 g/dL — ABNORMAL LOW (ref 12.0–15.0)
MCH: 30 pg (ref 26.0–34.0)
MCHC: 32.1 g/dL (ref 30.0–36.0)
MCV: 93.7 fL (ref 78.0–100.0)
Platelets: 272 10*3/uL (ref 150–400)
RBC: 3.33 MIL/uL — ABNORMAL LOW (ref 3.87–5.11)

## 2011-07-11 NOTE — Progress Notes (Signed)
I met with patient and son at bedside. Patient not at a level to be able to tolerate intense inpt rehab. I have discussed with her that we are recommending SNF rehab at this time. Please call with any questions. [phone]

## 2011-07-11 NOTE — Progress Notes (Signed)
CSW met with pt and daughter to provide bed offers. Pt's daughter is looking at facilities and will follow up with CSW on SNF choice. CSW will continue to follow to facilitate discharge to SNF when medically ready.   Dede Query, MSW, Theresia Majors [phone]

## 2011-07-11 NOTE — Progress Notes (Signed)
Dressing changed to Left upper arm per MD order. Pt tolerated well. Dion Saucier

## 2011-07-11 NOTE — Progress Notes (Signed)
Utilization review completed. Kinzley Savell, RN, BSN. 07/11/11  

## 2011-07-11 NOTE — Progress Notes (Signed)
Dressing changed per MD order to LBKA incision. Pt tolerated procedure well. Call bell within reach. Bed alarm on. Will continue to monitor. Dion Saucier

## 2011-07-11 NOTE — Progress Notes (Addendum)
[facility] Progress Note  07/11/2011 7:22 AM POD 6  Subjective:  Pt fell this am trying to get out of bed.  Afebrile x 24 hours Filed Vitals:   07/11/11 0533  BP: 142/76  Pulse:   Temp:   Resp:     Physical Exam: Incisions:  C/d/i with staples in tact Extremities:  Her stump has an area of hardening with hematoma anterior as well as warmth.  CBC    Component Value Date/Time   WBC 10.1 07/08/2011 0535   RBC 3.31* 07/08/2011 0535   HGB 9.9* 07/08/2011 0535   HCT 31.5* 07/08/2011 0535   PLT 256 07/08/2011 0535   MCV 95.2 07/08/2011 0535   MCH 29.9 07/08/2011 0535   MCHC 31.4 07/08/2011 0535   RDW 13.3 07/08/2011 0535   LYMPHSABS 2.0 06/27/2011 1213   MONOABS 0.5 06/27/2011 1213   EOSABS 0.4 06/27/2011 1213   BASOSABS 0.0 06/27/2011 1213    BMET    Component Value Date/Time   NA 142 07/08/2011 0535   K 4.6 07/08/2011 0535   CL 111 07/08/2011 0535   CO2 19 07/08/2011 0535   GLUCOSE 87 07/08/2011 0535   BUN 42* 07/08/2011 0535   CREATININE 1.82* 07/08/2011 0535   CALCIUM 8.3* 07/08/2011 0535   CALCIUM 9.6 10/11/2010 1605   GFRNONAA 24* 07/08/2011 0535   GFRAA 28* 07/08/2011 0535    INR    Component Value Date/Time   INR 1.05 10/04/2010 0139     Intake/Output Summary (Last 24 hours) at 07/11/11 0722 Last data filed at 07/10/11 2100  Gross per 24 hour  Intake    600 ml  Output    750 ml  Net   -150 ml     Assessment/Plan:  78 y.o. female is s/p left BKA POD 6 -dressing left off.  Will re-dress after [name] evaluates pt. -family member wants hip xray to eval post fall-ordered -check labs-will check CBC today and tomorrow.  Check BMP today as well.  Newton Pigg, PA-C [facility] [phone] 07/11/2011 7:22 AM  As above. Also complains of some left shoulder pain. We'll obtain x-ray of left shoulder. If left BKA is stable tomorrow. Okay to discharge to skilled nursing facility. (Hip x-ray is unremarkable)  Di Kindle.  Edilia Bo, MD, FACS Beeper [phone] 07/11/2011

## 2011-07-11 NOTE — Significant Event (Signed)
Pt had called to got to bathroom. Nurse tech was told pt needed to go. Pt fell in floor before nurse tech got to room. Pt stated she was going to bathroom because she did not want to wet the bed and forgot she had lost her leg until she stood up. Bed alarm was on but did not go off. Hourly rounding has been done all night. Pt was asleep when I checked on her at about 0405. Pt said stump was all that hurt. Dr Darrick Penna on call and informed. Pt BP 192/76 HR 92 O2 sat 99%. Pt on Lovenox for VTE. Dr Darrick Penna said that stump will be examined on rounds. Pt's son was called and informed of fall. He spoke with his mother over the phone. Huddle was done at bedside. Daemyn Gariepy,R.N.                         LDA Data. This SmartLink displays specified LDA data; either by LDA type or by LDA group ID. It should be setup for use in a SmartPhrase. If one is not available please Designer, multimedia.  This SmartLink requires parameters. Parameters are variables which can be added to the [facility] name. Note: this SmartLink will only display the last documented value for each assessment. Usage: LDA[LDAtype:showremoved:showdays LDAtype: A list of LDA types to display, separated by commas.  This can also be set to a specific LDA group ID. If using the group ID, it needs to be prefaced with a 'X'. showremoved: This is used to show 'removed' LDAs; set this to 1 to show them. showdays: This is a two piece comma-delimited parameter.  Set the first piece to 1 to display the number of days since insertion/assessment  Set the second piece to 1 to use calendar days. If null, the display will instead use 24 hour periods. Examples:  LDA[7 outputs the data for all LDA groups with a LDA type of 7  LDA[X10:1:1,1 outputs the data for LDA group 10, shows removed LDAs and number of days using calendar days.

## 2011-07-12 LAB — CBC
Hemoglobin: 9.7 g/dL — ABNORMAL LOW (ref 12.0–15.0)
MCH: 29.3 pg (ref 26.0–34.0)
MCHC: 31.5 g/dL (ref 30.0–36.0)
MCV: 93.1 fL (ref 78.0–100.0)
RBC: 3.31 MIL/uL — ABNORMAL LOW (ref 3.87–5.11)

## 2011-07-12 LAB — CREATININE, SERUM: GFR calc non Af Amer: 25 mL/min — ABNORMAL LOW (ref >90)

## 2011-07-12 MED ORDER — OXYCODONE HCL 5 MG PO CAPS: 5.0000 mg | ORAL_CAPSULE | ORAL | Status: AC | PRN

## 2011-07-12 NOTE — Progress Notes (Signed)
Occupational Therapy Treatment Patient Details Name: [name] MRN: [id number] DOB: May 19, 1923 Today's Date: 07/12/2011 Time: 2952-8413 OT Time Calculation (min): 28 min  OT Assessment / Plan / Recommendation Comments on Treatment Session Pt progressing well.    Follow Up Recommendations  Skilled nursing facility    Equipment Recommendations  Defer to next venue    Frequency     Plan Discharge plan needs to be updated    Precautions / Restrictions Precautions Precautions: Fall Restrictions Weight Bearing Restrictions: No LLE Weight Bearing: Non weight bearing   Pertinent Vitals/Pain Pt c/o residual limb pain with sit to stand. RN aware    ADL  Grooming: Performed;Set up;Supervision/safety Where Assessed - Grooming: Supported sitting (pt with unilateral support of RUE) Lower Body Dressing: Performed;Minimal assistance Where Assessed - Lower Body Dressing: Sitting, bed Ambulation Related to ADLs: Not attempted this session ADL Comments: Pt progressing well    OT Goals ADL Goals ADL Goal: Grooming - Progress: Progressing toward goals ADL Goal: Lower Body Bathing - Progress: Progressing toward goals ADL Goal: Toilet Transfer - Progress: Progressing toward goals ADL Goal: Additional Goal #1 - Progress: Progressing toward goals  Visit Information  Last OT Received On: 07/12/11 Assistance Needed: +2 PT/OT Co-Evaluation/Treatment: Yes               Cognition  Overall Cognitive Status: Appears within functional limits for tasks assessed/performed Arousal/Alertness: Awake/alert Orientation Level: Appears intact for tasks assessed Behavior During Session: [facility] for tasks performed Current Attention Level: Sustained    Mobility Bed Mobility Bed Mobility: Rolling Right;Right Sidelying to Sit;Sit to Sidelying Right;Sitting - Scoot to Edge of Bed Rolling Right: 4: Min assist;With rail Right Sidelying to Sit: 4: Min assist;With rails;HOB elevated (20 degrees) Supine to  Sit: With rails;HOB elevated;1: +2 Total assist (20 degrees) Supine to Sit: Patient Percentage: 30% Sitting - Scoot to Edge of Bed: 4: Min assist;With rail Sit to Sidelying Right: 1: +2 Total assist;With rail Sit to Sidelying Right: Patient Percentage: 30% Transfers Sit to Stand: 1: +2 Total assist;From elevated surface;With upper extremity assist;With armrests;From bed Sit to Stand: Patient Percentage: 70% Stand to Sit: 1: +2 Total assist;With upper extremity assist;To bed Stand to Sit: Patient Percentage: 60% Details for Transfer Assistance: PAtient needed verbal cues for hand placement.  Patient stood for 5-8 seconds x two trials.  Limited by pain and fatigue.     Exercises    Balance Static Sitting Balance Static Sitting - Balance Support: Bilateral upper extremity supported Static Sitting - Level of Assistance: 5: Stand by assistance Dynamic Sitting Balance Dynamic Sitting - Balance Support: Right upper extremity supported;During functional activity Dynamic Sitting - Level of Assistance: 5: Stand by assistance  End of Session OT - End of Session Equipment Utilized During Treatment: Gait belt Activity Tolerance: Patient tolerated treatment well Patient left: in bed;with call bell/phone within reach;with family/visitor present Nurse Communication: Mobility status   Gustav Knueppel 07/12/2011, 4:06 PM

## 2011-07-12 NOTE — Progress Notes (Signed)
VASCULAR PROGRESS NOTE  SUBJECTIVE: No specific complaints.  PHYSICAL EXAM: Filed Vitals:   07/11/11 1512 07/11/11 2056 07/12/11 0042 07/12/11 0602  BP: 113/70 152/68  168/85  Pulse: 90 76  77  Temp: 98.5 F (36.9 C) 98.7 F (37.1 C)  98.1 F (36.7 C)  TempSrc: Oral Oral  Oral  Resp: 18 20  20   Height:      Weight:   110 lb 7.2 oz (50.1 kg)   SpO2: 94% 95%  96%   Lungs: clear to auscultation Left BKA looks ok so far. Skin intact after fall. No drainage or erythema.   LABS: Lab Results  Component Value Date   WBC 6.3 07/12/2011   HGB 9.7* 07/12/2011   HCT 30.8* 07/12/2011   MCV 93.1 07/12/2011   PLT 303 07/12/2011   Lab Results  Component Value Date   CREATININE 1.76* 07/12/2011   Lab Results  Component Value Date   INR 1.05 10/04/2010   CBG (last 3)  No results found for this basename: GLUCAP:3 in the last 72 hours   ASSESSMENT/PLAN: 1. 7 Days Post-Op s/p: Left BKA 2. Ready for discharge today if SNF bed available. 3. Follow-up in 3 weeks for staple removal (I have arranged).  Waverly Ferrari, MD, FACS Beeper: [phone] 07/12/2011

## 2011-07-12 NOTE — Progress Notes (Signed)
Pt discharge instructions and patient education completed and in chart to go with pt to rehab. IV site d/c. Site WNL. Pt and family had no further questions. No s/s of distress. D/C to rehab via ambulance. Dion Saucier

## 2011-07-12 NOTE — Progress Notes (Signed)
Patient's daughter [name] requesting that all side rails be up on mrs. [name] bed at all times. Patient is in agreeement with this.

## 2011-07-12 NOTE — Discharge Summary (Signed)
[facility]  Discharge Summary  [name]  11/07/1923 78 y.o. female  [id number]   Admission Date:  07/05/2011   Discharge Date:  07/12/11   Physician:  Shannon Hint, MD   Admission Diagnosis:  Peripheral Vascular Disease   HPI:  This is a 78 y.o. female who presents to the office today for further followup regarding ischemia of her left lower extremity. She was seen at the toe in the emergency room on April 8. At that time she was offered a arteriogram for further investigation of her left lower extremity circulation versus a primary left below-knee amputation. At that time she wished to think about the 2 possibilities. She is nearing end-stage renal disease and is artery had a fistula placed. Had lengthy discussion with the patient and her family today regarding these 2 options. I did discuss with her that we could do a primary amputation of her left lower extremity which would solve her rest pain symptoms. She is currently having rest pain continuously not really a hydrocodone. The other option would be to do an arteriogram with the risk of contrast nephropathy. However I also emphasized that even without an arteriogram she probably will end up on dialysis in the near future since Dr. Darrick Penna is artery has place a fistula. At this point she has decided that she would prefer a primary amputation. I discussed with the family the risk of 10-15% chance of not healing a below-knee amputation and the possibility that she would need to return to the operating room for an above-knee amputation at some point down the road if the below-knee amputation did not heal. Other risks benefits and complications were discussed with the family as well.   Hospital Course:  The patient was admitted to the hospital and taken to the operating room on 07/05/2011 and underwent a left BKA. The pt tolerated the procedure well and was transported to the PACU in good condition. She was placed  on a PCA post operatively and this was able to be eventually weaned successfully.  On POD 2, she had a K+ of 5.7. She was on lasix 40mg  po bid. The K+ was removed from IVF and kayexalate was also given and her K+ was within normal limits the next day.   PT/OT consult was obtained.   Dressing changes were continued daily.  On POD 6, the pt fell when trying to get out of bed. Her stump had an area with hematoma and was stable at discharge. Skin was in tact and no erythema and no drainage.   The left shoulder and left hip x-rays were unremarkable.  The remainder of the hospital course consisted of increasing mobilization and increasing intake of solids without difficulty.   CBC    Component  Value  Date/Time    WBC  6.3  07/12/2011 0554    RBC  3.31*  07/12/2011 0554    HGB  9.7*  07/12/2011 0554    HCT  30.8*  07/12/2011 0554    PLT  303  07/12/2011 0554    MCV  93.1  07/12/2011 0554    MCH  29.3  07/12/2011 0554    MCHC  31.5  07/12/2011 0554    RDW  13.3  07/12/2011 0554    LYMPHSABS  2.0  06/27/2011 1213    MONOABS  0.5  06/27/2011 1213    EOSABS  0.4  06/27/2011 1213    BASOSABS  0.0  06/27/2011 1213  BMET    Component  Value  Date/Time    NA  138  07/11/2011 0910    K  3.8  07/11/2011 0910    CL  105  07/11/2011 0910    CO2  21  07/11/2011 0910    GLUCOSE  156*  07/11/2011 0910    BUN  36*  07/11/2011 0910    CREATININE  1.76*  07/12/2011 0554    CALCIUM  9.0  07/11/2011 0910    CALCIUM  9.6  10/11/2010 1605    GFRNONAA  25*  07/12/2011 0554    GFRAA  29*  07/12/2011 0554    Discharge Instructions:  The patient is discharged to home with extensive instructions on wound care and progressive ambulation. They are instructed not to drive or perform any heavy lifting until returning to see the physician in his office.   Discharge Orders    Future Orders  Please Complete By  Expires    Resume previous diet      Lifting restrictions      Comments:    No lifting for 6 weeks    Call MD for:  temperature >100.5      Call MD for: redness, tenderness, or signs of infection (pain, swelling, bleeding, redness, odor or green/yellow discharge around incision site)      Call MD for: severe or increased pain, loss or decreased feeling in affected limb(s)      may wash over wound with mild soap and water      Scheduling Instructions:    Shower daily with soap and water    Change dressing (specify)      Comments:    Daily dressing changes to left stump with 4x4's, kerlix and ACE wrap being careful not to wrap to tightly and the biotech device.      Discharge Diagnosis:  Peripheral Vascular Disease  Secondary Diagnosis:  Patient Active Problem List   Diagnoses   .  CKD (chronic kidney disease) stage 3, GFR 30-59 ml/min   .  CAD (coronary artery disease)   .  Cardiomyopathy, idiopathic   .  Hypothyroid   .  Anterior chest wall pain   .  End stage renal disease   .  Ischemia of extremity    Past Medical History   Diagnosis  Date   .  Coronary artery disease    .  TIA (transient ischemic attack)    .  Hypertension    .  Shingles    .  Stroke    .  Thyroid disease    .  Anemia    .  CAD (coronary artery disease)  02/08/2011   .  Cardiomyopathy, idiopathic  02/08/2011   .  Chronic kidney disease    .  Angina      Takes Isosorbide   .  Unstable angina  02/08/2011   .  Myocardial infarct      x 3 unsure of years   .  Irregular heartbeat    .  Hypothyroidism      Takes Levothryroxine   .  Hypothyroid  02/08/2011   .  Ulcer    .  GERD (gastroesophageal reflux disease)      takes Protonix and zantac   .  Hx of transient ischemic attack (TIA)    .  Memory loss    .  CHF (congestive heart failure)      Takes Lasix   .  Gout  takes allopurinol   .  Peripheral vascular disease      left lower leg   .  Diabetes mellitus      type 2 NIDDM x 2 years; no meds   .  Chronic kidney disease (CKD), stage IV (severe)    .  Arthritis    .  Shortness of breath      [name], [name]    Home Medication Instructions  VHQ:[id number]    Printed on:07/12/11 4132   Medication Information                     calcitRIOL (ROCALTROL) 0.25 MCG capsule  Take 0.25 mcg by mouth 2 (two) times daily.           carvedilol (COREG) 6.25 MG tablet  Take 6.25 mg by mouth 2 (two) times daily with a meal.           atorvastatin (LIPITOR) 40 MG tablet  Take 40 mg by mouth daily.           pantoprazole (PROTONIX) 40 MG tablet  Take 40 mg by mouth daily.           allopurinol (ZYLOPRIM) 100 MG tablet  Take 100 mg by mouth daily.           isosorbide mononitrate (IMDUR) 60 MG 24 hr tablet  Take 60 mg by mouth daily.           furosemide (LASIX) 40 MG tablet  Take 40 mg by mouth 2 (two) times daily.           acetaminophen (TYLENOL) 500 MG tablet  Take 500 mg by mouth as needed. For pain.           ranitidine (ZANTAC) 150 MG tablet  Take 150 mg by mouth daily as needed. For heart burn.           nitroGLYCERIN (NITROSTAT) 0.4 MG SL tablet  Place 0.4 mg under the tongue every 5 (five) minutes as needed. For chest pain           levothyroxine (SYNTHROID, LEVOTHROID) 125 MCG tablet  Take 1 tablet (125 mcg total) by mouth daily.           gabapentin (NEURONTIN) 300 MG capsule  Take 300 mg by mouth 2 (two) times daily.           clopidogrel (PLAVIX) 75 MG tablet  Take 75 mg by mouth daily.           oxycodone (OXYIR) 5 MG capsule  Take 1 capsule (5 mg total) by mouth every 4 (four) hours as needed.  #30 NR            Disposition: SNF   Patient's condition: is Good   Follow up:  1. [name] in 3 weeks   Newton Pigg, PA-C  [facility]  [phone]  07/12/2011  7:24 AM

## 2011-07-12 NOTE — Progress Notes (Deleted)
[facility]  Discharge Summary  [name]  02/27/1924 78 y.o. female  [id number]   Admission Date:  07/05/2011   Discharge Date:  07/12/11   Physician:  Christopher S Dickson, MD   Admission Diagnosis:  Peripheral Vascular Disease   HPI:  This is a 78 y.o. female who presents to the office today for further followup regarding ischemia of her left lower extremity. She was seen at the [facility] in the emergency room on April 8. At that time she was offered a arteriogram for further investigation of her left lower extremity circulation versus a primary left below-knee amputation. At that time she wished to think about the 2 possibilities. She is nearing end-stage renal disease and is artery had a fistula placed. Had lengthy discussion with the patient and her family today regarding these 2 options. I did discuss with her that we could do a primary amputation of her left lower extremity which would solve her rest pain symptoms. She is currently having rest pain continuously not really a hydrocodone. The other option would be to do an arteriogram with the risk of contrast nephropathy. However I also emphasized that even without an arteriogram she probably will end up on dialysis in the near future since Dr. Deterding is artery has place a fistula. At this point she has decided that she would prefer a primary amputation. I discussed with the family the risk of 10-15% chance of not healing a below-knee amputation and the possibility that she would need to return to the operating room for an above-knee amputation at some point down the road if the below-knee amputation did not heal. Other risks benefits and complications were discussed with the family as well.   Hospital Course:  The patient was admitted to the hospital and taken to the operating room on 07/05/2011 and underwent a left BKA. The pt tolerated the procedure well and was transported to the PACU in good condition. She was placed  on a PCA post operatively and this was able to be eventually weaned successfully.  On POD 2, she had a K+ of 5.7. She was on lasix 40mg po bid. The K+ was removed from IVF and kayexalate was also given and her K+ was within normal limits the next day.   PT/OT consult was obtained.   Dressing changes were continued daily.  On POD 6, the pt fell when trying to get out of bed. Her stump had an area with hematoma and was stable at discharge. Skin was in tact and no erythema and no drainage.   The left shoulder and left hip x-rays were unremarkable.  The remainder of the hospital course consisted of increasing mobilization and increasing intake of solids without difficulty.   CBC    Component  Value  Date/Time    WBC  6.3  07/12/2011 0554    RBC  3.31*  07/12/2011 0554    HGB  9.7*  07/12/2011 0554    HCT  30.8*  07/12/2011 0554    PLT  303  07/12/2011 0554    MCV  93.1  07/12/2011 0554    MCH  29.3  07/12/2011 0554    MCHC  31.5  07/12/2011 0554    RDW  13.3  07/12/2011 0554    LYMPHSABS  2.0  06/27/2011 1213    MONOABS  0.5  06/27/2011 1213    EOSABS  0.4  06/27/2011 1213    BASOSABS  0.0  06/27/2011 1213      BMET    Component  Value  Date/Time    NA  138  07/11/2011 0910    K  3.8  07/11/2011 0910    CL  105  07/11/2011 0910    CO2  21  07/11/2011 0910    GLUCOSE  156*  07/11/2011 0910    BUN  36*  07/11/2011 0910    CREATININE  1.76*  07/12/2011 0554    CALCIUM  9.0  07/11/2011 0910    CALCIUM  9.6  10/11/2010 1605    GFRNONAA  25*  07/12/2011 0554    GFRAA  29*  07/12/2011 0554    Discharge Instructions:  The patient is discharged to home with extensive instructions on wound care and progressive ambulation. They are instructed not to drive or perform any heavy lifting until returning to see the physician in his office.   Discharge Orders    Future Orders  Please Complete By  Expires    Resume previous diet      Lifting restrictions      Comments:    No lifting for 6 weeks    Call MD for:  temperature >100.5      Call MD for: redness, tenderness, or signs of infection (pain, swelling, bleeding, redness, odor or green/yellow discharge around incision site)      Call MD for: severe or increased pain, loss or decreased feeling in affected limb(s)      may wash over wound with mild soap and water      Scheduling Instructions:    Shower daily with soap and water    Change dressing (specify)      Comments:    Daily dressing changes to left stump with 4x4's, kerlix and ACE wrap being careful not to wrap to tightly and the biotech device.      Discharge Diagnosis:  Peripheral Vascular Disease  Secondary Diagnosis:  Patient Active Problem List   Diagnoses   .  CKD (chronic kidney disease) stage 3, GFR 30-59 ml/min   .  CAD (coronary artery disease)   .  Cardiomyopathy, idiopathic   .  Hypothyroid   .  Anterior chest wall pain   .  End stage renal disease   .  Ischemia of extremity    Past Medical History   Diagnosis  Date   .  Coronary artery disease    .  TIA (transient ischemic attack)    .  Hypertension    .  Shingles    .  Stroke    .  Thyroid disease    .  Anemia    .  CAD (coronary artery disease)  02/08/2011   .  Cardiomyopathy, idiopathic  02/08/2011   .  Chronic kidney disease    .  Angina      Takes Isosorbide   .  Unstable angina  02/08/2011   .  Myocardial infarct      x 3 unsure of years   .  Irregular heartbeat    .  Hypothyroidism      Takes Levothryroxine   .  Hypothyroid  02/08/2011   .  Ulcer    .  GERD (gastroesophageal reflux disease)      takes Protonix and zantac   .  Hx of transient ischemic attack (TIA)    .  Memory loss    .  CHF (congestive heart failure)      Takes Lasix   .  Gout        takes allopurinol   .  Peripheral vascular disease      left lower leg   .  Diabetes mellitus      type 2 NIDDM x 2 years; no meds   .  Chronic kidney disease (CKD), stage IV (severe)    .  Arthritis    .  Shortness of breath      [name],  [name]    Home Medication Instructions  HAR:[id number]    Printed on:07/12/11 0724   Medication Information                     calcitRIOL (ROCALTROL) 0.25 MCG capsule  Take 0.25 mcg by mouth 2 (two) times daily.           carvedilol (COREG) 6.25 MG tablet  Take 6.25 mg by mouth 2 (two) times daily with a meal.           atorvastatin (LIPITOR) 40 MG tablet  Take 40 mg by mouth daily.           pantoprazole (PROTONIX) 40 MG tablet  Take 40 mg by mouth daily.           allopurinol (ZYLOPRIM) 100 MG tablet  Take 100 mg by mouth daily.           isosorbide mononitrate (IMDUR) 60 MG 24 hr tablet  Take 60 mg by mouth daily.           furosemide (LASIX) 40 MG tablet  Take 40 mg by mouth 2 (two) times daily.           acetaminophen (TYLENOL) 500 MG tablet  Take 500 mg by mouth as needed. For pain.           ranitidine (ZANTAC) 150 MG tablet  Take 150 mg by mouth daily as needed. For heart burn.           nitroGLYCERIN (NITROSTAT) 0.4 MG SL tablet  Place 0.4 mg under the tongue every 5 (five) minutes as needed. For chest pain           levothyroxine (SYNTHROID, LEVOTHROID) 125 MCG tablet  Take 1 tablet (125 mcg total) by mouth daily.           gabapentin (NEURONTIN) 300 MG capsule  Take 300 mg by mouth 2 (two) times daily.           clopidogrel (PLAVIX) 75 MG tablet  Take 75 mg by mouth daily.           oxycodone (OXYIR) 5 MG capsule  Take 1 capsule (5 mg total) by mouth every 4 (four) hours as needed.  #30 NR            Disposition: SNF   Patient's condition: is Good   Follow up:  1. Dr. Dickson in 3 weeks   Labrian Torregrossa Ellington, PA-C  [facility]  [phone]  07/12/2011  7:24 AM  

## 2011-07-12 NOTE — Progress Notes (Signed)
Pt is ready for discharge today to [facility]. Blue Medicare auth obtained. [facility] will provide transportation. Pt and family are agreeable to discharge plan. CSW is signing off as no further clinical social work needs identified.   Dede Query, MSW, Theresia Majors [phone]

## 2011-07-12 NOTE — Progress Notes (Signed)
Physical Therapy Treatment Patient Details Name: [name] MRN: [id number] DOB: 02-10-1924 Today's Date: 07/12/2011 Time: 7829-5621 PT Time Calculation (min): 34 min  PT Assessment / Plan / Recommendation Comments on Treatment Session  Patient s/p left LE amputation with decr mobility secondary to pain and weakness.  Patient stood better today to RW needing less assist and with better posture.      Follow Up Recommendations  Supervision/Assistance - 24 hour;Skilled nursing facility    Equipment Recommendations  Defer to next venue    Frequency Min 3X/week   Plan Frequency remains appropriate;Discharge plan needs to be updated    Precautions / Restrictions Precautions Precautions: Fall Restrictions Weight Bearing Restrictions: No LLE Weight Bearing: Non weight bearing   Pertinent Vitals/Pain VSS/ No pain    Mobility  Bed Mobility Bed Mobility: Rolling Right;Right Sidelying to Sit;Sit to Sidelying Right;Sitting - Scoot to Edge of Bed Rolling Right: 4: Min assist;With rail Right Sidelying to Sit: 4: Min assist;With rails;HOB elevated (20 degrees) Supine to Sit: With rails;HOB elevated;1: +2 Total assist (20 degrees) Supine to Sit: Patient Percentage: 30% Sitting - Scoot to Edge of Bed: 4: Min assist;With rail Sit to Sidelying Right: 1: +2 Total assist;With rail Sit to Sidelying Right: Patient Percentage: 30% Transfers Transfers: Sit to Stand;Stand to Sit Sit to Stand: 1: +2 Total assist;From elevated surface;With upper extremity assist;With armrests;From bed Sit to Stand: Patient Percentage: 70% Stand to Sit: 1: +2 Total assist;With upper extremity assist;To bed Stand to Sit: Patient Percentage: 60% Squat Pivot Transfers: Not tested (comment) Details for Transfer Assistance: PAtient needed verbal cues for hand placement.  Patient stood for 5-8 seconds x two trials.  Limited by pain and fatigue.   Ambulation/Gait Ambulation/Gait Assistance: Not tested (comment) Stairs:  No Wheelchair Mobility Wheelchair Mobility: No         PT Goals Acute Rehab PT Goals PT Goal: Supine/Side to Sit - Progress: Progressing toward goal PT Goal: Sit at Edge Of Bed - Progress: Progressing toward goal PT Goal: Sit to Supine/Side - Progress: Progressing toward goal PT Goal: Sit to Stand - Progress: Progressing toward goal PT Goal: Stand to Sit - Progress: Progressing toward goal PT Transfer Goal: Bed to Chair/Chair to Bed - Progress: Progressing toward goal  Visit Information  Last PT Received On: 07/12/11 Assistance Needed: +2 PT/OT Co-Evaluation/Treatment: Yes    Subjective Data  Subjective: " I feel like I can do more today."   Cognition  Overall Cognitive Status: Appears within functional limits for tasks assessed/performed Arousal/Alertness: Awake/alert Orientation Level: Appears intact for tasks assessed Behavior During Session: [facility] for tasks performed Current Attention Level: Sustained    Balance  Static Sitting Balance Static Sitting - Balance Support: Bilateral upper extremity supported Static Sitting - Level of Assistance: 5: Stand by assistance Dynamic Sitting Balance Dynamic Sitting - Balance Support: Right upper extremity supported;During functional activity Dynamic Sitting - Level of Assistance: 5: Stand by assistance  End of Session PT - End of Session Equipment Utilized During Treatment: Gait belt Activity Tolerance: Patient tolerated treatment well Patient left: in bed;with call bell/phone within reach;with bed alarm set;with family/visitor present Nurse Communication: Mobility status    INGOLD,Stacy Deshler 07/12/2011, 2:57 PM  [facility] Acute [facility] [phone] (pager)

## 2011-07-12 NOTE — Discharge Summary (Signed)
Agree with plans for discharge today.   Javae Braaten S. Charita Lindenberger, MD, FACS Beeper [phone] 07/12/2011  

## 2011-07-19 ENCOUNTER — Other Ambulatory Visit: Payer: Self-pay | Admitting: Endocrinology

## 2011-07-19 DIAGNOSIS — R250 Abnormal head movements: Secondary | ICD-10-CM

## 2011-07-19 DIAGNOSIS — R29898 Other symptoms and signs involving the musculoskeletal system: Secondary | ICD-10-CM

## 2011-07-20 ENCOUNTER — Other Ambulatory Visit: Payer: Medicare Other

## 2011-07-26 ENCOUNTER — Ambulatory Visit
Admission: RE | Admit: 2011-07-26 | Discharge: 2011-07-26 | Disposition: A | Discharge status: Home / Self Care | Payer: Medicare Other | Source: Ambulatory Visit | Attending: Endocrinology | Admitting: Endocrinology

## 2011-07-26 DIAGNOSIS — R29898 Other symptoms and signs involving the musculoskeletal system: Secondary | ICD-10-CM

## 2011-07-26 DIAGNOSIS — R250 Abnormal head movements: Secondary | ICD-10-CM

## 2011-08-02 ENCOUNTER — Encounter: Payer: Self-pay | Admitting: Neurosurgery

## 2011-08-03 ENCOUNTER — Encounter: Payer: Self-pay | Admitting: Neurosurgery

## 2011-08-03 ENCOUNTER — Ambulatory Visit (INDEPENDENT_AMBULATORY_CARE_PROVIDER_SITE_OTHER): Payer: Medicare Other | Admitting: Neurosurgery

## 2011-08-03 VITALS — BP 169/65 | HR 68 | Temp 98.5°F | Resp 12 | Ht <= 58 in | Wt 110.0 lb

## 2011-08-03 DIAGNOSIS — I999 Unspecified disorder of circulatory system: Secondary | ICD-10-CM

## 2011-08-03 DIAGNOSIS — I998 Other disorder of circulatory system: Secondary | ICD-10-CM

## 2011-08-03 NOTE — Progress Notes (Signed)
Subjective:     Patient ID: MYSTY KIELTY, female   DOB: July 07, 1923, 78 y.o.   MRN: [id number]  HPI: 78 year old patient of Dr. Adele Dan that is three-week status post left BKA for ischemic limb. She is in a SNIF and currently doing well. She does have complaints of gout in her right foot, she has a long history of gout in the lower extremities per her daughter. No other problems reported, she does have a small scabbed area on her knee on the operative side from a fall while she was hospitalized. It is healing well there is no redness or drainage.   Review of Systems: 12 point review of systems is notable for the difficulties described above otherwise unremarkable      Objective:   Physical Exam: Vital signs are stable patient is healing well, the surgical site is clean dry and intact staples and the room today there is no redness or drainage.. She is in a stump protector and has an Ace wrap in place with shaking continue to use. PT and DP pulses are very diminished in the right lower extremity, no carotid bruits are heard      Assessment:      78 year old female patient 3 weeks status post left BKA and doing well will continue at the [facility] until it is determined she may return home if that is what family decides.     Plan:      I spoke with [name] regarding her surgery she may return here in 6 months for followup with him or sooner if problems arise. The patient and her daughter in agreement with this her questions were encouraged and answered.  Lauree Chandler ANP  Clinic M.D.: Edilia Bo

## 2011-09-06 DIAGNOSIS — Z9289 Personal history of other medical treatment: Secondary | ICD-10-CM

## 2011-09-06 HISTORY — DX: Personal history of other medical treatment: Z92.89

## 2011-10-17 ENCOUNTER — Telehealth: Payer: Self-pay

## 2011-10-17 ENCOUNTER — Telehealth: Payer: Self-pay | Admitting: Vascular Surgery

## 2011-10-17 DIAGNOSIS — I739 Peripheral vascular disease, unspecified: Secondary | ICD-10-CM

## 2011-10-17 DIAGNOSIS — L819 Disorder of pigmentation, unspecified: Secondary | ICD-10-CM

## 2011-10-17 NOTE — Telephone Encounter (Signed)
Juliane Lack took Ms Miranda to [facility] this morning for her prosthesis fitting. They mentioned to the [employer] re pthat she was having problems with the right foot now. The rep suggested that they call our office to get in with [facility] soon.  Mathis Fare states that Ms Amer's right foot is painful, swollen and "kind of red", but his biggest concern was that her heel is darker than the rest of her  foot. Mr Loleta Chance stated that he left a message with the triage nurse, but he wasn't sure he did it right. So, I told him that I would relay this message to our nurse and she would call him once she has prioritized her afternoon calls. Mr Loleta Chance said he would wait to hear back from [place].  Annabelle Harman

## 2011-10-17 NOTE — Telephone Encounter (Signed)
Son called to report pt. C/o increased pain in right foot when touched.  States she cannot stand on right foot.  States there is swelling and redness on top of right foot, and there is a dark area in right heel.  Denies any open sores.  States her right foot feels warm to touch.  Questioned about fever/chills;  Stated "she feels cold all the time".  Discussed w/ Dr. Myra Gianotti.  Advised to do ABI's  and have evaluated on Wednesday, 7/31, when [name] is in clinic.

## 2011-10-18 ENCOUNTER — Encounter: Payer: Self-pay | Admitting: Neurosurgery

## 2011-10-18 NOTE — Telephone Encounter (Signed)
[name] had this on her phone message book and has already spoke to Patient's family about [name].

## 2011-10-19 ENCOUNTER — Ambulatory Visit (INDEPENDENT_AMBULATORY_CARE_PROVIDER_SITE_OTHER): Payer: Medicare Other | Admitting: Vascular Surgery

## 2011-10-19 ENCOUNTER — Encounter (INDEPENDENT_AMBULATORY_CARE_PROVIDER_SITE_OTHER): Payer: Medicare Other | Admitting: *Deleted

## 2011-10-19 ENCOUNTER — Other Ambulatory Visit: Payer: Self-pay

## 2011-10-19 ENCOUNTER — Encounter: Payer: Self-pay | Admitting: Neurosurgery

## 2011-10-19 ENCOUNTER — Encounter (HOSPITAL_COMMUNITY): Payer: Self-pay | Admitting: Pharmacy Technician

## 2011-10-19 VITALS — BP 143/59 | HR 68 | Resp 14 | Ht <= 58 in | Wt 117.0 lb

## 2011-10-19 DIAGNOSIS — I739 Peripheral vascular disease, unspecified: Secondary | ICD-10-CM

## 2011-10-19 DIAGNOSIS — Z48812 Encounter for surgical aftercare following surgery on the circulatory system: Secondary | ICD-10-CM

## 2011-10-19 DIAGNOSIS — M79673 Pain in unspecified foot: Secondary | ICD-10-CM

## 2011-10-19 DIAGNOSIS — L819 Disorder of pigmentation, unspecified: Secondary | ICD-10-CM

## 2011-10-19 DIAGNOSIS — M79609 Pain in unspecified limb: Secondary | ICD-10-CM

## 2011-10-19 NOTE — Assessment & Plan Note (Signed)
This patient has critical limb ischemia of the right lower extremity based on her Doppler findings. The etiology of her right foot pain is not clear. I have ordered an x-ray to rule out a fracture area of her pain could potentially be related to gout. However, given her markedly diminished toe pressure on the right with critical limb ischemia I think we should proceed with CO2 arteriography in order to further evaluate her circulation on the right. Clearly she is at risk for limb loss on the right also. Her arteriogram is scheduled for 10/31/2011. I have reviewed with the patient the indications for arteriography. In addition, I have reviewed the potential complications of arteriography including but not limited to: Bleeding, arterial injury, arterial thrombosis,  or other unpredictable medical problems. I have explained to the patient that if we find disease amenable to angioplasty we could potentially address this at the same time. I have discussed the potential complications of angioplasty and stenting, including but not limited to: Bleeding, arterial thrombosis, arterial injury, dissection, or the need for surgical intervention.

## 2011-10-19 NOTE — Addendum Note (Signed)
Addended by: Sharee Pimple on: 10/19/2011 12:34 PM   Modules accepted: Orders

## 2011-10-19 NOTE — Progress Notes (Signed)
[facility] [place]  Patient name: [name] MRN: [id number] DOB: Sep 23, 1923 Sex: female  REASON FOR VISIT: pain right foot  HPI: [name] is a 78 y.o. female who had a left below the knee amputation on 07/05/2011. She had been evaluated by Dr. Darrick Penna and was not a candidate for revascularization on the left and I was asked to perform a below the knee amputation. She has not yet been fitted for a prosthesis and she's been having pain in her right foot when she puts weight on it. She does not describe any symptoms classically consistent with rest pain. The pain is mostly in her ankle and right great toe. She does not remember any specific injury to her foot. She's had no fever. Her pain has been progressive over the last 2 weeks.  Past Medical History  Diagnosis Date  . Coronary artery disease   . TIA (transient ischemic attack)   . Hypertension   . Shingles   . Stroke   . Thyroid disease   . Anemia   . CAD (coronary artery disease) 02/08/2011  . Cardiomyopathy, idiopathic 02/08/2011  . Chronic kidney disease   . Angina     Takes Isosorbide  . Unstable angina 02/08/2011  . Myocardial infarct     x 3 unsure of years  . Irregular heartbeat   . Hypothyroidism     Takes Levothryroxine  . Hypothyroid 02/08/2011  . Ulcer   . GERD (gastroesophageal reflux disease)     takes Protonix and zantac  . Hx of transient ischemic attack (TIA)   . Memory loss   . CHF (congestive heart failure)     Takes Lasix  . Gout     takes allopurinol  . Peripheral vascular disease     left lower  leg  . Diabetes mellitus     type 2 NIDDM x 2 years; no meds  . Chronic kidney disease (CKD), stage IV (severe)   . Arthritis   . Shortness of breath     Family History  Problem Relation Age of Onset  . Cancer Sister     STOMACH  . Cancer Brother     BONE  . Anesthesia problems Neg Hx   . Hypotension Neg Hx   . Malignant hyperthermia Neg Hx   . Pseudochol  deficiency Neg Hx     SOCIAL HISTORY: History  Substance Use Topics  . Smoking status: Former Smoker -- 0.5 packs/day for 40 years    Quit date: 07/05/1986  . Smokeless tobacco: Never Used   Comment: quit smoking 1988  . Alcohol Use: No    Allergies  Allergen Reactions  . Aspirin Nausea And Vomiting    [name]     Current Outpatient Prescriptions  Medication Sig Dispense Refill  . acetaminophen (TYLENOL) 500 MG tablet Take 500 mg by mouth as needed. For pain.       Marland Kitchen atorvastatin (LIPITOR) 40 MG tablet Take 40 mg by mouth daily.        . calcium acetate (PHOSLO) 667 MG capsule Take 667 mg by mouth 3 (three) times daily with meals.      . carvedilol (COREG) 6.25 MG tablet Take 6.25 mg by mouth 2 (two) times daily with a meal.       . cinacalcet (SENSIPAR) 60 MG tablet Take 60 mg by mouth daily.      . clopidogrel (PLAVIX) 75  MG tablet Take 75 mg by mouth daily.      . diphenhydrAMINE (BENADRYL) 25 MG tablet Take 25 mg by mouth every 8 (eight) hours as needed.      . furosemide (LASIX) 40 MG tablet Take 40 mg by mouth 2 (two) times daily.       Marland Kitchen gabapentin (NEURONTIN) 300 MG capsule Take 300 mg by mouth 2 (two) times daily.      . isosorbide mononitrate (IMDUR) 60 MG 24 hr tablet Take 60 mg by mouth daily.       . lansoprazole (PREVACID) 30 MG capsule Take 30 mg by mouth daily.      Marland Kitchen levothyroxine (SYNTHROID, LEVOTHROID) 125 MCG tablet Take 1 tablet (125 mcg total) by mouth daily.  30 tablet  11  . nitroGLYCERIN (NITROSTAT) 0.4 MG SL tablet Place 0.4 mg under the tongue every 5 (five) minutes as needed. For chest pain      . oxycodone (OXY-IR) 5 MG capsule Take 5 mg by mouth every 6 (six) hours as needed.      Marland Kitchen oxycodone-acetaminophen (LYNOX) 2.5-300 MG per tablet Take 1 tablet by mouth 2 (two) times daily.      . ranitidine (ZANTAC) 150 MG tablet Take 150 mg by mouth daily as needed. For heart burn.      . traMADol  (ULTRAM) 50 MG tablet Take 50 mg by mouth every 6 (six) hours as needed.      Marland Kitchen allopurinol (ZYLOPRIM) 100 MG tablet Take 100 mg by mouth daily.       . calcitRIOL (ROCALTROL) 0.25 MCG capsule Take 0.25 mcg by mouth 2 (two) times daily.       . pantoprazole (PROTONIX) 40 MG tablet Take 40 mg by mouth daily.         REVIEW OF SYSTEMS: [name] ] denotes positive finding; [  ] denotes negative finding  CARDIOVASCULAR:  [ ]  chest pain   [ ]  chest pressure   [ ]  palpitations   [ ]  orthopnea   [ ]  dyspnea on exertion   [ ]  claudication   [ ]  rest pain   [ ]  DVT   [ ]  phlebitis PULMONARY:   [ ]  productive cough   [ ]  asthma   [ ]  wheezing CONSTITUTIONAL:  [ ]  fever   [ ]  chills  PHYSICAL EXAM: Filed Vitals:   10/19/11 1041  BP: 143/59  Pulse: 68  Resp: 14  Height: 4\' 9"  (1.448 m)  Weight: 117 lb (53.071 kg)  SpO2: 100%   Body mass index is 25.32 kg/(m^2). GENERAL: The patient is a well-nourished female, in no acute distress. The vital signs are documented above. CARDIOVASCULAR: There is a regular rate and rhythm without significant murmur appreciated. She has palpable femoral pulses. I cannot palpate a right popliteal or pedal pulses on the right. PULMONARY: There is good air exchange bilaterally without wheezing or rales. ABDOMEN: Soft and non-tender with normal pitched bowel sounds.  MUSCULOSKELETAL: she has a left below-the-knee amputation which is healed nicely. NEUROLOGIC: No focal weakness or paresthesias are detected. SKIN: There are no ulcers or rashes noted. PSYCHIATRIC: The patient has a normal affect.  DATA:  I have independently interpreted her arterial Doppler study which shows monophasic Doppler signals in the right foot in the dorsalis pedis and posterior tibial positions. Digital pressure on the right is 17 mm of mercury. The arteries are noncompressible and ABIs could not be obtained.  MEDICAL ISSUES:  Critical lower limb ischaemia  This patient has critical limb ischemia of  the right lower extremity based on her Doppler findings. The etiology of her right foot pain is not clear. I have ordered an x-ray to rule out a fracture area of her pain could potentially be related to gout. However, given her markedly diminished toe pressure on the right with critical limb ischemia I think we should proceed with CO2 arteriography in order to further evaluate her circulation on the right. Clearly she is at risk for limb loss on the right also. Her arteriogram is scheduled for 10/31/2011. I have reviewed with the patient the indications for arteriography. In addition, I have reviewed the potential complications of arteriography including but not limited to: Bleeding, arterial injury, arterial thrombosis,  or other unpredictable medical problems. I have explained to the patient that if we find disease amenable to angioplasty we could potentially address this at the same time. I have discussed the potential complications of angioplasty and stenting, including but not limited to: Bleeding, arterial thrombosis, arterial injury, dissection, or the need for surgical intervention.    DICKSON,CHRISTOPHER S [facility] [place] Beeper: [phone]

## 2011-10-30 MED ORDER — SODIUM CHLORIDE 0.9 % IV SOLN: INTRAVENOUS | Status: DC

## 2011-10-31 ENCOUNTER — Ambulatory Visit (HOSPITAL_COMMUNITY)
Admission: RE | Admit: 2011-10-31 | Discharge: 2011-10-31 | Disposition: A | Discharge status: Home / Self Care | Payer: Medicare Other | Source: Ambulatory Visit | Attending: Vascular Surgery | Admitting: Vascular Surgery

## 2011-10-31 ENCOUNTER — Telehealth: Payer: Self-pay | Admitting: Vascular Surgery

## 2011-10-31 ENCOUNTER — Encounter (HOSPITAL_COMMUNITY)
Admission: RE | Disposition: A | Discharge status: Home / Self Care | Payer: Self-pay | Source: Ambulatory Visit | Attending: Vascular Surgery

## 2011-10-31 DIAGNOSIS — Z8673 Personal history of transient ischemic attack (TIA), and cerebral infarction without residual deficits: Secondary | ICD-10-CM | POA: Insufficient documentation

## 2011-10-31 DIAGNOSIS — S88119A Complete traumatic amputation at level between knee and ankle, unspecified lower leg, initial encounter: Secondary | ICD-10-CM | POA: Insufficient documentation

## 2011-10-31 DIAGNOSIS — I70219 Atherosclerosis of native arteries of extremities with intermittent claudication, unspecified extremity: Secondary | ICD-10-CM

## 2011-10-31 DIAGNOSIS — I129 Hypertensive chronic kidney disease with stage 1 through stage 4 chronic kidney disease, or unspecified chronic kidney disease: Secondary | ICD-10-CM | POA: Insufficient documentation

## 2011-10-31 DIAGNOSIS — I509 Heart failure, unspecified: Secondary | ICD-10-CM | POA: Insufficient documentation

## 2011-10-31 DIAGNOSIS — I2510 Atherosclerotic heart disease of native coronary artery without angina pectoris: Secondary | ICD-10-CM | POA: Insufficient documentation

## 2011-10-31 DIAGNOSIS — E119 Type 2 diabetes mellitus without complications: Secondary | ICD-10-CM | POA: Insufficient documentation

## 2011-10-31 DIAGNOSIS — N184 Chronic kidney disease, stage 4 (severe): Secondary | ICD-10-CM | POA: Insufficient documentation

## 2011-10-31 DIAGNOSIS — I70209 Unspecified atherosclerosis of native arteries of extremities, unspecified extremity: Secondary | ICD-10-CM | POA: Insufficient documentation

## 2011-10-31 HISTORY — PX: LOWER EXTREMITY ANGIOGRAM: SHX5508

## 2011-10-31 LAB — POCT I-STAT, CHEM 8
BUN: 33 mg/dL — ABNORMAL HIGH (ref 6–23)
BUN: 24 mg/dL — ABNORMAL HIGH (ref 6–23)
Calcium, Ion: 1.23 mmol/L (ref 1.13–1.30)
Chloride: 111 mEq/L (ref 96–112)
Chloride: 109 meq/L (ref 96–112)
Creatinine, Ser: 1.9 mg/dL — ABNORMAL HIGH (ref 0.50–1.10)
Creatinine, Ser: 1.7 mg/dL — ABNORMAL HIGH (ref 0.50–1.10)
Glucose, Bld: 97 mg/dL (ref 70–99)
HCT: 28 % — ABNORMAL LOW (ref 36.0–46.0)
Hemoglobin: 9.5 g/dL — ABNORMAL LOW (ref 12.0–15.0)
Potassium: 6 mEq/L — ABNORMAL HIGH (ref 3.5–5.1)
Potassium: 4.5 meq/L (ref 3.5–5.1)
Sodium: 138 mEq/L (ref 135–145)
Sodium: 141 meq/L (ref 135–145)
TCO2: 22 mmol/L (ref 0–100)

## 2011-10-31 LAB — GLUCOSE, CAPILLARY: Glucose-Capillary: 99 mg/dL (ref 70–99)

## 2011-10-31 SURGERY — ANGIOGRAM, LOWER EXTREMITY
Anesthesia: LOCAL

## 2011-10-31 MED ORDER — ONDANSETRON HCL 4 MG/2ML IJ SOLN
INTRAMUSCULAR | Status: AC
  Filled 2011-10-31: qty 2

## 2011-10-31 MED ORDER — MIDAZOLAM HCL 2 MG/2ML IJ SOLN
INTRAMUSCULAR | Status: AC
  Filled 2011-10-31: qty 2

## 2011-10-31 MED ORDER — LABETALOL HCL 5 MG/ML IV SOLN: 10.0000 mg | INTRAVENOUS | Status: DC | PRN

## 2011-10-31 MED ORDER — HEPARIN (PORCINE) IN NACL 2-0.9 UNIT/ML-% IJ SOLN
INTRAMUSCULAR | Status: AC
  Filled 2011-10-31: qty 1000

## 2011-10-31 MED ORDER — ACETAMINOPHEN 325 MG PO TABS: 650.0000 mg | ORAL_TABLET | ORAL | Status: DC | PRN

## 2011-10-31 MED ORDER — LABETALOL HCL 5 MG/ML IV SOLN
INTRAVENOUS | Status: AC
  Filled 2011-10-31: qty 4

## 2011-10-31 MED ORDER — FENTANYL CITRATE 0.05 MG/ML IJ SOLN
INTRAMUSCULAR | Status: AC
  Filled 2011-10-31: qty 2

## 2011-10-31 MED ORDER — LIDOCAINE HCL (PF) 1 % IJ SOLN
INTRAMUSCULAR | Status: AC
  Filled 2011-10-31: qty 30

## 2011-10-31 MED ORDER — ONDANSETRON HCL 4 MG/2ML IJ SOLN: 4.0000 mg | Freq: Four times a day (QID) | INTRAMUSCULAR | Status: DC | PRN

## 2011-10-31 NOTE — Op Note (Signed)
PATIENT: [name]   MRN: [id number] DOB: Sep 30, 1923    DATE OF PROCEDURE: 10/31/2011  INDICATIONS: [name] is a 78 y.o. female with progressive ischemia of the right lower extremity  PROCEDURE: ultrasound-guided access to the left common femoral artery, selective catheterization of the right external iliac artery with right lower extremity runoff, selective catheterization of the right superficial femoral artery. The entire procedure was performed using CO2.  SURGEON: Di Kindle. Edilia Bo, MD, FACS  ANESTHESIA: local with sedation   EBL: minimal  TECHNIQUE: The patient was brought to the peripheral vascular lab and received half a milligram of Versed and 25 mcg of fentanyl after a left femoral venous line was placed. The left groin had been prepped and draped in usual sterile fashion. After the skin was anesthetized, and under ultrasound guidance, the left femoral vein was cannulated and a 5 French sheath introduced over a wire. Next again under ultrasound guidance and after the skin was anesthetized the left common femoral artery was cannulated under ultrasound guidance. A wire was introduced and the infrarenal aorta and a 5 French sheath placed. A crossover catheter was then positioned into the right common iliac artery and the wire advanced into the external iliac artery. The crossover catheter was exchanged for an endhole catheter. CO2 arteriogram was obtained using spot films. In order to get better visualization distally and angled Glidewire was advanced into the superficial femoral artery and then the endhole catheter passed over the wire. Selective right superficial femoral artery CO2 arteriogram was obtained. At the completion of the procedure the sheaths were removed and pressure held for hemostasis.   FINDINGS:  1. The right external iliac and common femoral arteries are patent. 2. The right superficial femoral artery is patent although it has diffuse calcific disease. Right  popliteal artery is patent. 3. There is single vessel runoff via the peroneal artery on the right which is moderate diffuse disease. The anterior tibial and posterior tibial arteries are occluded. 4. There is no bypassable disease in the right lower extremity.  Waverly Ferrari, MD, FACS [facility]  DATE OF DICTATION:   10/31/2011

## 2011-10-31 NOTE — H&P (View-Only) (Signed)
[facility] [place]  Patient name: [name] MRN: [id number] DOB: 05/06/1923 Sex: female  REASON FOR VISIT: pain right foot  HPI: [name] is a 78 y.o. female who had a left below the knee amputation on 07/05/2011. She had been evaluated by Dr. Fields and was not a candidate for revascularization on the left and I was asked to perform a below the knee amputation. She has not yet been fitted for a prosthesis and she's been having pain in her right foot when she puts weight on it. She does not describe any symptoms classically consistent with rest pain. The pain is mostly in her ankle and right great toe. She does not remember any specific injury to her foot. She's had no fever. Her pain has been progressive over the last 2 weeks.  Past Medical History  Diagnosis Date  . Coronary artery disease   . TIA (transient ischemic attack)   . Hypertension   . Shingles   . Stroke   . Thyroid disease   . Anemia   . CAD (coronary artery disease) 02/08/2011  . Cardiomyopathy, idiopathic 02/08/2011  . Chronic kidney disease   . Angina     Takes Isosorbide  . Unstable angina 02/08/2011  . Myocardial infarct     x 3 unsure of years  . Irregular heartbeat   . Hypothyroidism     Takes Levothryroxine  . Hypothyroid 02/08/2011  . Ulcer   . GERD (gastroesophageal reflux disease)     takes Protonix and zantac  . Hx of transient ischemic attack (TIA)   . Memory loss   . CHF (congestive heart failure)     Takes Lasix  . Gout     takes allopurinol  . Peripheral vascular disease     left lower  leg  . Diabetes mellitus     type 2 NIDDM x 2 years; no meds  . Chronic kidney disease (CKD), stage IV (severe)   . Arthritis   . Shortness of breath     Family History  Problem Relation Age of Onset  . Cancer Sister     STOMACH  . Cancer Brother     BONE  . Anesthesia problems Neg Hx   . Hypotension Neg Hx   . Malignant hyperthermia Neg Hx   . Pseudochol  deficiency Neg Hx     SOCIAL HISTORY: History  Substance Use Topics  . Smoking status: Former Smoker -- 0.5 packs/day for 40 years    Quit date: 07/05/1986  . Smokeless tobacco: Never Used   Comment: quit smoking 1988  . Alcohol Use: No    Allergies  Allergen Reactions  . Aspirin Nausea And Vomiting    [name]     Current Outpatient Prescriptions  Medication Sig Dispense Refill  . acetaminophen (TYLENOL) 500 MG tablet Take 500 mg by mouth as needed. For pain.       . atorvastatin (LIPITOR) 40 MG tablet Take 40 mg by mouth daily.        . calcium acetate (PHOSLO) 667 MG capsule Take 667 mg by mouth 3 (three) times daily with meals.      . carvedilol (COREG) 6.25 MG tablet Take 6.25 mg by mouth 2 (two) times daily with a meal.       . cinacalcet (SENSIPAR) 60 MG tablet Take 60 mg by mouth daily.      . clopidogrel (PLAVIX) 75   MG tablet Take 75 mg by mouth daily.      . diphenhydrAMINE (BENADRYL) 25 MG tablet Take 25 mg by mouth every 8 (eight) hours as needed.      . furosemide (LASIX) 40 MG tablet Take 40 mg by mouth 2 (two) times daily.       . gabapentin (NEURONTIN) 300 MG capsule Take 300 mg by mouth 2 (two) times daily.      . isosorbide mononitrate (IMDUR) 60 MG 24 hr tablet Take 60 mg by mouth daily.       . lansoprazole (PREVACID) 30 MG capsule Take 30 mg by mouth daily.      . levothyroxine (SYNTHROID, LEVOTHROID) 125 MCG tablet Take 1 tablet (125 mcg total) by mouth daily.  30 tablet  11  . nitroGLYCERIN (NITROSTAT) 0.4 MG SL tablet Place 0.4 mg under the tongue every 5 (five) minutes as needed. For chest pain      . oxycodone (OXY-IR) 5 MG capsule Take 5 mg by mouth every 6 (six) hours as needed.      . oxycodone-acetaminophen (LYNOX) 2.5-300 MG per tablet Take 1 tablet by mouth 2 (two) times daily.      . ranitidine (ZANTAC) 150 MG tablet Take 150 mg by mouth daily as needed. For heart burn.      . traMADol  (ULTRAM) 50 MG tablet Take 50 mg by mouth every 6 (six) hours as needed.      . allopurinol (ZYLOPRIM) 100 MG tablet Take 100 mg by mouth daily.       . calcitRIOL (ROCALTROL) 0.25 MCG capsule Take 0.25 mcg by mouth 2 (two) times daily.       . pantoprazole (PROTONIX) 40 MG tablet Take 40 mg by mouth daily.         REVIEW OF SYSTEMS: [X ] denotes positive finding; [  ] denotes negative finding  CARDIOVASCULAR:  [ ] chest pain   [ ] chest pressure   [ ] palpitations   [ ] orthopnea   [ ] dyspnea on exertion   [ ] claudication   [ ] rest pain   [ ] DVT   [ ] phlebitis PULMONARY:   [ ] productive cough   [ ] asthma   [ ] wheezing CONSTITUTIONAL:  [ ] fever   [ ] chills  PHYSICAL EXAM: Filed Vitals:   10/19/11 1041  BP: 143/59  Pulse: 68  Resp: 14  Height: 4' 9" (1.448 m)  Weight: 117 lb (53.071 kg)  SpO2: 100%   Body mass index is 25.32 kg/(m^2). GENERAL: The patient is a well-nourished female, in no acute distress. The vital signs are documented above. CARDIOVASCULAR: There is a regular rate and rhythm without significant murmur appreciated. She has palpable femoral pulses. I cannot palpate a right popliteal or pedal pulses on the right. PULMONARY: There is good air exchange bilaterally without wheezing or rales. ABDOMEN: Soft and non-tender with normal pitched bowel sounds.  MUSCULOSKELETAL: she has a left below-the-knee amputation which is healed nicely. NEUROLOGIC: No focal weakness or paresthesias are detected. SKIN: There are no ulcers or rashes noted. PSYCHIATRIC: The patient has a normal affect.  DATA:  I have independently interpreted her arterial Doppler study which shows monophasic Doppler signals in the right foot in the dorsalis pedis and posterior tibial positions. Digital pressure on the right is 17 mm of mercury. The arteries are noncompressible and ABIs could not be obtained.  MEDICAL ISSUES:  Critical lower limb ischaemia   This patient has critical limb ischemia of  the right lower extremity based on her Doppler findings. The etiology of her right foot pain is not clear. I have ordered an x-ray to rule out a fracture area of her pain could potentially be related to gout. However, given her markedly diminished toe pressure on the right with critical limb ischemia I think we should proceed with CO2 arteriography in order to further evaluate her circulation on the right. Clearly she is at risk for limb loss on the right also. Her arteriogram is scheduled for 10/31/2011. I have reviewed with the patient the indications for arteriography. In addition, I have reviewed the potential complications of arteriography including but not limited to: Bleeding, arterial injury, arterial thrombosis,  or other unpredictable medical problems. I have explained to the patient that if we find disease amenable to angioplasty we could potentially address this at the same time. I have discussed the potential complications of angioplasty and stenting, including but not limited to: Bleeding, arterial thrombosis, arterial injury, dissection, or the need for surgical intervention.    Andrae Claunch S [facility] [place] Beeper: [phone]    

## 2011-10-31 NOTE — Telephone Encounter (Signed)
Message copied by Fredrich Birks on Mon Oct 31, 2011  4:38 PM ------      Message from: Phillips Odor      Created: Mon Oct 31, 2011  1:59 PM      Regarding: FW: charge and follow up                   ----- Message -----         From: Chuck Hint, MD         Sent: 10/31/2011   8:48 AM           To: Reuel Derby, Melene Plan, RN      Subject: charge and follow up                                     PROCEDURE: ultrasound-guided access to the left common femoral artery, selective catheterization of the right external iliac artery with right lower extremity runoff, selective catheterization of the right superficial femoral artery. The entire procedure was performed using CO2.            She needs a follow up visit in 3-4 weeks.            CSD

## 2011-10-31 NOTE — Interval H&P Note (Signed)
History and Physical Interval Note:  10/31/2011 7:41 AM  [name]  has presented today for surgery, with the diagnosis of pvd  The various methods of treatment have been discussed with the patient and family. After consideration of risks, benefits and other options for treatment, the patient has consented to: LOWER EXTREMITY ANGIOGRAM AND POSSIBLE ANGIOPLASTY AND STENTING OF RIGHT LOWER EXTREMITY.  The patient's history has been reviewed, patient examined, no change in status, stable for surgery.  I have reviewed the patient's chart and labs.  Questions were answered to the patient's satisfaction.     DICKSON,CHRISTOPHER S

## 2011-10-31 NOTE — Telephone Encounter (Signed)
Spoke with patients son to notify of appointment, dpm

## 2011-11-22 ENCOUNTER — Encounter: Payer: Self-pay | Admitting: Vascular Surgery

## 2011-11-23 ENCOUNTER — Encounter: Payer: Self-pay | Admitting: Vascular Surgery

## 2011-11-23 ENCOUNTER — Ambulatory Visit (INDEPENDENT_AMBULATORY_CARE_PROVIDER_SITE_OTHER): Payer: Medicare Other | Admitting: Vascular Surgery

## 2011-11-23 VITALS — BP 180/78 | HR 84 | Temp 97.6°F | Resp 16 | Ht <= 58 in | Wt 115.0 lb

## 2011-11-23 DIAGNOSIS — Z48812 Encounter for surgical aftercare following surgery on the circulatory system: Secondary | ICD-10-CM

## 2011-11-23 DIAGNOSIS — I7092 Chronic total occlusion of artery of the extremities: Secondary | ICD-10-CM

## 2011-11-23 DIAGNOSIS — M629 Disorder of muscle, unspecified: Secondary | ICD-10-CM

## 2011-11-23 NOTE — Progress Notes (Signed)
[facility] [place]  Patient name: [name] MRN: [id number] DOB: 12/05/1923 Sex: female  REASON FOR VISIT: follow up after arteriogram  HPI: [name] is a 78 y.o. female who I had seen on 10/19/2011 with pain in her right foot. She had a previous left below-the-knee amputation in April of this year. Given the progressive pain in the right foot she underwent an arteriogram on 10/31/2011. This showed that she had an occluded anterior tibial and posterior tibial artery that in line flow through the peroneal artery on the right. Thus there were no options that would significantly impact the circulation the right leg.  She is essentially nonambulatory. She's had no problems with her left below the knee amputation. She does not have significant rest pain in the right foot at this point.   REVIEW OF SYSTEMS: [name] ] denotes positive finding; [  ] denotes negative finding  CARDIOVASCULAR:  [ ]  chest pain   [ ]  dyspnea on exertion    CONSTITUTIONAL:  [ ]  fever   [ ]  chills  PHYSICAL EXAM: Filed Vitals:   11/23/11 1020  BP: 180/78  Pulse: 84  Temp: 97.6 F (36.4 C)  TempSrc: Oral  Resp: 16  Height: 4\' 9"  (1.448 m)  Weight: 115 lb (52.164 kg)  SpO2: 98%   Body mass index is 24.89 kg/(m^2). GENERAL: The patient is a well-nourished female, in no acute distress. The vital signs are documented above. CARDIOVASCULAR: There is a regular rate and rhythm  PULMONARY: There is good air exchange bilaterally without wheezing or rales. She has palpable femoral pulses. I cannot palpate pulses in the right foot. The left below the knee amputation is healed.  MEDICAL ISSUES: I've encouraged her to keep a close watch on her right foot to prevent the development of any ulcers. I think she does have adequate circulation at this point given her limited activity. I'll see her back in 6 months and I ordered a follow up ABIs for that time. She knows to call sooner if she has  problems.  DICKSON,CHRISTOPHER S [facility] [place] Beeper: [phone]

## 2011-11-24 ENCOUNTER — Encounter: Payer: Self-pay | Admitting: Vascular Surgery

## 2012-01-18 ENCOUNTER — Other Ambulatory Visit: Payer: Self-pay | Admitting: *Deleted

## 2012-01-18 DIAGNOSIS — Z48812 Encounter for surgical aftercare following surgery on the circulatory system: Secondary | ICD-10-CM

## 2012-01-18 DIAGNOSIS — I739 Peripheral vascular disease, unspecified: Secondary | ICD-10-CM

## 2012-01-31 ENCOUNTER — Encounter: Payer: Self-pay | Admitting: Vascular Surgery

## 2012-02-01 ENCOUNTER — Ambulatory Visit (INDEPENDENT_AMBULATORY_CARE_PROVIDER_SITE_OTHER): Payer: Medicare Other | Admitting: Vascular Surgery

## 2012-02-01 ENCOUNTER — Encounter: Payer: Self-pay | Admitting: Vascular Surgery

## 2012-02-01 ENCOUNTER — Ambulatory Visit: Payer: Medicare Other | Admitting: Vascular Surgery

## 2012-02-01 VITALS — BP 165/52 | HR 87 | Ht <= 58 in | Wt 117.0 lb

## 2012-02-01 DIAGNOSIS — I7092 Chronic total occlusion of artery of the extremities: Secondary | ICD-10-CM

## 2012-02-01 NOTE — Progress Notes (Signed)
[facility] [place]  Patient name: [name] MRN: [id number] DOB: April 09, 1923 Sex: female  REASON FOR VISIT: follow up of peripheral vascular disease.  HPI: [name] is a 78 y.o. female who I been following with peripheral vascular disease. She's had a previous left below the knee dictation. She was having increasing pain in her right foot and underwent an arteriogram in August which showed  A patent aortoiliac system and femoropopliteal system with an occluded anterior tibial and posterior tibial artery. She did however have a patent peroneal artery to the ankle. Thus there were no options for revascularization. She comes in for a follow up visit. She continues to describe some pain in her right foot with no aggravating or alleviating factors. She describes some tingling and burning possibly consistent with neuropathy. She does not have a prosthesis for her left leg.  Past Medical History  Diagnosis Date  . Coronary artery disease   . TIA (transient ischemic attack)   . Hypertension   . Shingles   . Stroke   . Thyroid disease   . Anemia   . CAD (coronary artery disease) 02/08/2011  . Cardiomyopathy, idiopathic 02/08/2011  . Chronic kidney disease   . Angina     Takes Isosorbide  . Unstable angina 02/08/2011  . Myocardial infarct     x 3 unsure of years  . Irregular heartbeat   . Hypothyroidism     Takes Levothryroxine  . Hypothyroid 02/08/2011  . Ulcer   . GERD (gastroesophageal reflux disease)     takes Protonix and zantac  . Hx of transient ischemic attack (TIA)   . Memory loss   . CHF (congestive heart failure)     Takes Lasix  . Gout     takes allopurinol  . Peripheral vascular disease     left lower  leg  . Diabetes mellitus     type 2 NIDDM x 2 years; no meds  . Chronic kidney disease (CKD), stage IV (severe)   . Arthritis   . Shortness of breath     Family History  Problem Relation Age of Onset  . Cancer Sister     STOMACH  .  Diabetes Sister   . Cancer Brother     BONE  . Diabetes Brother   . Anesthesia problems Neg Hx   . Hypotension Neg Hx   . Malignant hyperthermia Neg Hx   . Pseudochol deficiency Neg Hx   . Hyperlipidemia Daughter   . Hypertension Daughter   . Heart disease Daughter     before age 26  . Kidney disease Daughter   . Heart attack Daughter   . Other Daughter     varicose veins    SOCIAL HISTORY: History  Substance Use Topics  . Smoking status: Former Smoker -- 0.5 packs/day for 40 years    Quit date: 07/05/1986  . Smokeless tobacco: Never Used     Comment: quit smoking 1988  . Alcohol Use: No    Allergies  Allergen Reactions  . Aspirin Nausea And Vomiting    [name]    REVIEW OF SYSTEMS: [name] ] denotes positive finding; [  ] denotes negative finding  CARDIOVASCULAR:  [ ]  chest pain   [ ]  chest pressure   [ ]  palpitations   [ ]  dyspnea on exertion    PULMONARY:   [ ]  productive cough   [ ]  asthma   [ ]   wheezing CONSTITUTIONAL:  [ ]  fever   [ ]  chills  PHYSICAL EXAM: Filed Vitals:   02/01/12 1114  BP: 165/52  Pulse: 87  Height: 4\' 9"  (1.448 m)  Weight: 117 lb (53.071 kg)  SpO2: 100%   Body mass index is 25.32 kg/(m^2). GENERAL: The patient is a well-nourished female, in no acute distress. The vital signs are documented above. CARDIOVASCULAR: There is a regular rate and rhythm. I do not detect carotid bruits. She has a palpable right femoral pulse. PULMONARY: There is good air exchange bilaterally without wheezing or rales. NEUROLOGIC: No focal weakness or paresthesias are detected. SKIN: She has no ulcers on her right foot. The foot is warm. The left BKA is healed. PSYCHIATRIC: The patient has a normal affect.  MEDICAL ISSUES: Her pain in her right foot is likely related to neuropathy. The foot appears warm and well-perfused. She has single vessel runoff via the peroneal artery with no options for  revascularization. I'll see her back in 6 months with follow up ABI on the right. She knows to call sooner if she has problems.  Chuckie Mccathern S [facility] [place] Beeper: [phone]

## 2012-02-02 NOTE — Addendum Note (Signed)
Addended by: Sharee Pimple on: 02/02/2012 10:28 AM   Modules accepted: Orders

## 2012-02-06 ENCOUNTER — Encounter (HOSPITAL_COMMUNITY): Payer: Self-pay

## 2012-02-06 ENCOUNTER — Emergency Department (HOSPITAL_COMMUNITY): Payer: Medicare Other

## 2012-02-06 ENCOUNTER — Observation Stay (HOSPITAL_COMMUNITY)
Admission: EM | Admit: 2012-02-06 | Discharge: 2012-02-08 | Disposition: A | Discharge status: Home / Self Care | Payer: Medicare Other | Attending: Internal Medicine | Admitting: Internal Medicine

## 2012-02-06 DIAGNOSIS — N184 Chronic kidney disease, stage 4 (severe): Secondary | ICD-10-CM | POA: Insufficient documentation

## 2012-02-06 DIAGNOSIS — R0789 Other chest pain: Secondary | ICD-10-CM

## 2012-02-06 DIAGNOSIS — R0602 Shortness of breath: Secondary | ICD-10-CM | POA: Insufficient documentation

## 2012-02-06 DIAGNOSIS — Z48812 Encounter for surgical aftercare following surgery on the circulatory system: Secondary | ICD-10-CM

## 2012-02-06 DIAGNOSIS — I2510 Atherosclerotic heart disease of native coronary artery without angina pectoris: Secondary | ICD-10-CM

## 2012-02-06 DIAGNOSIS — R079 Chest pain, unspecified: Principal | ICD-10-CM

## 2012-02-06 DIAGNOSIS — D631 Anemia in chronic kidney disease: Secondary | ICD-10-CM | POA: Insufficient documentation

## 2012-02-06 DIAGNOSIS — Z23 Encounter for immunization: Secondary | ICD-10-CM | POA: Insufficient documentation

## 2012-02-06 DIAGNOSIS — I70229 Atherosclerosis of native arteries of extremities with rest pain, unspecified extremity: Secondary | ICD-10-CM

## 2012-02-06 DIAGNOSIS — R112 Nausea with vomiting, unspecified: Secondary | ICD-10-CM | POA: Insufficient documentation

## 2012-02-06 DIAGNOSIS — N186 End stage renal disease: Secondary | ICD-10-CM

## 2012-02-06 DIAGNOSIS — E119 Type 2 diabetes mellitus without complications: Secondary | ICD-10-CM | POA: Insufficient documentation

## 2012-02-06 DIAGNOSIS — S88119A Complete traumatic amputation at level between knee and ankle, unspecified lower leg, initial encounter: Secondary | ICD-10-CM | POA: Insufficient documentation

## 2012-02-06 DIAGNOSIS — I429 Cardiomyopathy, unspecified: Secondary | ICD-10-CM

## 2012-02-06 DIAGNOSIS — I509 Heart failure, unspecified: Secondary | ICD-10-CM | POA: Insufficient documentation

## 2012-02-06 DIAGNOSIS — N183 Chronic kidney disease, stage 3 unspecified: Secondary | ICD-10-CM

## 2012-02-06 DIAGNOSIS — Z7902 Long term (current) use of antithrombotics/antiplatelets: Secondary | ICD-10-CM | POA: Insufficient documentation

## 2012-02-06 DIAGNOSIS — Z79899 Other long term (current) drug therapy: Secondary | ICD-10-CM | POA: Insufficient documentation

## 2012-02-06 DIAGNOSIS — I739 Peripheral vascular disease, unspecified: Secondary | ICD-10-CM

## 2012-02-06 DIAGNOSIS — M629 Disorder of muscle, unspecified: Secondary | ICD-10-CM

## 2012-02-06 DIAGNOSIS — Z8673 Personal history of transient ischemic attack (TIA), and cerebral infarction without residual deficits: Secondary | ICD-10-CM | POA: Insufficient documentation

## 2012-02-06 DIAGNOSIS — I998 Other disorder of circulatory system: Secondary | ICD-10-CM

## 2012-02-06 DIAGNOSIS — I7092 Chronic total occlusion of artery of the extremities: Secondary | ICD-10-CM

## 2012-02-06 DIAGNOSIS — E039 Hypothyroidism, unspecified: Secondary | ICD-10-CM

## 2012-02-06 DIAGNOSIS — I428 Other cardiomyopathies: Secondary | ICD-10-CM

## 2012-02-06 DIAGNOSIS — I129 Hypertensive chronic kidney disease with stage 1 through stage 4 chronic kidney disease, or unspecified chronic kidney disease: Secondary | ICD-10-CM | POA: Insufficient documentation

## 2012-02-06 DIAGNOSIS — M2420 Disorder of ligament, unspecified site: Secondary | ICD-10-CM

## 2012-02-06 LAB — CBC
MCH: 28.8 pg (ref 26.0–34.0)
MCHC: 31.6 g/dL (ref 30.0–36.0)
MCV: 91.2 fL (ref 78.0–100.0)
Platelets: 346 10*3/uL (ref 150–400)
RBC: 3.75 MIL/uL — ABNORMAL LOW (ref 3.87–5.11)
RDW: 14.4 % (ref 11.5–15.5)

## 2012-02-06 LAB — POCT I-STAT TROPONIN I: Troponin i, poc: 0.01 ng/mL (ref 0.00–0.08)

## 2012-02-06 MED ORDER — SODIUM CHLORIDE 0.9 % IV BOLUS (SEPSIS)
500.0000 mL | Freq: Once | INTRAVENOUS | Status: AC
  Administered 2012-02-06: 500 mL via INTRAVENOUS

## 2012-02-06 NOTE — ED Notes (Signed)
From home; c/o midchest, nonradiating cp that started around 8 pm tonight. Pt took nitro 0.4 mg x 3. EMS administered Nitro 0.4 mg x 2 en route. Pt refused ASA. Denies sob, dizziness or syncope. Hx MI x 3. AAOx4, resp e/u, skin warm and dry, NAD

## 2012-02-06 NOTE — ED Provider Notes (Addendum)
History     CSN: [id number]  Arrival date & time 02/06/12  2242   First MD Initiated Contact with Patient 02/06/12 2255      Chief Complaint  Patient presents with  . Chest Pain    (Consider location/radiation/quality/duration/timing/severity/associated sxs/prior treatment) Patient is a 78 y.o. female presenting with chest pain. The history is provided by the patient.  Chest Pain The chest pain began 3 - 5 hours ago. Duration of episode(s) is 5 hours. Chest pain occurs constantly. The chest pain is resolved (with nitro). At its most intense, the pain is at 7/10. The pain is currently at 0/10. The severity of the pain is mild. The quality of the pain is described as aching. The pain does not radiate. Pertinent negatives for primary symptoms include no fever, no fatigue, no syncope, no shortness of breath, no cough, no wheezing, no palpitations, no abdominal pain, no nausea, no vomiting, no dizziness and no altered mental status. She tried nitroglycerin for the symptoms. Risk factors include post-menopausal and being elderly.  Her past medical history is significant for CAD, diabetes, MI and PVD.     Past Medical History  Diagnosis Date  . Coronary artery disease   . TIA (transient ischemic attack)   . Hypertension   . Shingles   . Stroke   . Thyroid disease   . Anemia   . CAD (coronary artery disease) 02/08/2011  . Cardiomyopathy, idiopathic 02/08/2011  . Chronic kidney disease   . Angina     Takes Isosorbide  . Unstable angina 02/08/2011  . Myocardial infarct     x 3 unsure of years  . Irregular heartbeat   . Hypothyroidism     Takes Levothryroxine  . Hypothyroid 02/08/2011  . Ulcer   . GERD (gastroesophageal reflux disease)     takes Protonix and zantac  . Hx of transient ischemic attack (TIA)   . Memory loss   . CHF (congestive heart failure)     Takes Lasix  . Gout     takes allopurinol  . Peripheral vascular disease     left lower  leg  . Diabetes mellitus       type 2 NIDDM x 2 years; no meds  . Chronic kidney disease (CKD), stage IV (severe)   . Arthritis   . Shortness of breath     Past Surgical History  Procedure Date  . Cardiac catheterization   . Back surgery     [facility]  . Eye surgery     Left eye surgery; cataract removal  . Left bka 07/05/2011  . Av fistula placement   . Amputation 07/05/2011    Procedure: AMPUTATION BELOW KNEE;  Surgeon: Chuck Hint, MD;  Location: [facility] OR;  Service: Vascular;  Laterality: Left;    Family History  Problem Relation Age of Onset  . Cancer Sister     STOMACH  . Diabetes Sister   . Cancer Brother     BONE  . Diabetes Brother   . Anesthesia problems Neg Hx   . Hypotension Neg Hx   . Malignant hyperthermia Neg Hx   . Pseudochol deficiency Neg Hx   . Hyperlipidemia Daughter   . Hypertension Daughter   . Heart disease Daughter     before age 33  . Kidney disease Daughter   . Heart attack Daughter   . Other Daughter     varicose veins    History  Substance Use Topics  . Smoking status:  Former Smoker -- 0.5 packs/day for 40 years    Quit date: 07/05/1986  . Smokeless tobacco: Never Used     Comment: quit smoking 1988  . Alcohol Use: No    OB History    Grav Para Term Preterm Abortions TAB SAB Ect Mult Living                  Review of Systems  Constitutional: Negative for fever and fatigue.  Respiratory: Negative for cough, shortness of breath and wheezing.   Cardiovascular: Positive for chest pain. Negative for palpitations and syncope.  Gastrointestinal: Negative for nausea, vomiting and abdominal pain.  Neurological: Negative for dizziness.  Psychiatric/Behavioral: Negative for altered mental status.  All other systems reviewed and are negative.    Allergies  Aspirin  Home Medications   Current Outpatient Rx  Name  Route  Sig  Dispense  Refill  . ACETAMINOPHEN 500 MG PO TABS   Oral   Take 500 mg by mouth as needed. For pain.          Marland Kitchen  ALLOPURINOL 100 MG PO TABS   Oral   Take 100 mg by mouth daily.          . ATORVASTATIN CALCIUM 40 MG PO TABS   Oral   Take 40 mg by mouth daily.           Marland Kitchen CALCITRIOL 0.25 MCG PO CAPS   Oral   Take 0.25 mcg by mouth 2 (two) times daily.          Marland Kitchen CALCIUM ACETATE 667 MG PO CAPS   Oral   Take 667 mg by mouth 3 (three) times daily with meals.         Marland Kitchen CARVEDILOL 6.25 MG PO TABS   Oral   Take 6.25 mg by mouth 2 (two) times daily with a meal.          . CINACALCET HCL 60 MG PO TABS   Oral   Take 60 mg by mouth daily.         Marland Kitchen CLOPIDOGREL BISULFATE 75 MG PO TABS   Oral   Take 75 mg by mouth daily.         Marland Kitchen DIPHENHYDRAMINE HCL 25 MG PO TABS   Oral   Take 25 mg by mouth every 8 (eight) hours as needed. For allergies         . FUROSEMIDE 40 MG PO TABS   Oral   Take 40 mg by mouth 2 (two) times daily.          Marland Kitchen GABAPENTIN 300 MG PO CAPS   Oral   Take 300 mg by mouth 2 (two) times daily.         . ISOSORBIDE MONONITRATE ER 60 MG PO TB24   Oral   Take 60 mg by mouth daily.          Marland Kitchen LANSOPRAZOLE 30 MG PO CPDR   Oral   Take 30 mg by mouth daily.         Marland Kitchen LEVOTHYROXINE SODIUM 125 MCG PO TABS   Oral   Take 1 tablet (125 mcg total) by mouth daily.   30 tablet   11   . NITROGLYCERIN 0.4 MG SL SUBL   Sublingual   Place 0.4 mg under the tongue every 5 (five) minutes as needed. For chest pain         . OXYCODONE HCL 5 MG PO CAPS   Oral  Take 5 mg by mouth every 6 (six) hours as needed. For pain         . OXYCODONE-ACETAMINOPHEN 2.5-300 MG PO TABS   Oral   Take 0.5 tablets by mouth daily.          Marland Kitchen PANTOPRAZOLE SODIUM 40 MG PO TBEC   Oral   Take 40 mg by mouth daily.          Marland Kitchen RANITIDINE HCL 150 MG PO TABS   Oral   Take 150 mg by mouth daily as needed. For heart burn.           SpO2 98%  Physical Exam  Constitutional: She is oriented to person, place, and time. She appears well-developed and well-nourished.    HENT:  Head: Normocephalic and atraumatic.  Eyes: Conjunctivae normal and EOM are normal. Pupils are equal, round, and reactive to light.  Neck: Normal range of motion.  Cardiovascular: Normal rate, regular rhythm and normal heart sounds.   Pulmonary/Chest: Effort normal and breath sounds normal.  Abdominal: Soft. Bowel sounds are normal.  Musculoskeletal: Normal range of motion.       Left lower extremitiy amputation  Neurological: She is alert and oriented to person, place, and time.  Skin: Skin is warm and dry.  Psychiatric: She has a normal mood and affect. Her behavior is normal.    ED Course  Procedures (including critical care time)   Labs Reviewed  CBC  BASIC METABOLIC PANEL   No results found.   No diagnosis found.   Date: 02/06/2012  Rate: 95  Rhythm: normal sinus rhythm  QRS Axis: normal  Intervals: normal  ST/T Wave abnormalities: nonspecific ST changes  Conduction Disutrbances:none  Narrative Interpretation:   Old EKG Reviewed: changes noted   MDM  + chest pain, resolved.  Will check labs,  Discuss with pt cards.  No recent cath or stress per pt.  Will reassess ce neg.  Will admit        Rosanne Ashing, MD 02/06/12 8119  Kyleen Villatoro Lytle Michaels, MD 02/07/12 [phone]

## 2012-02-07 ENCOUNTER — Encounter (HOSPITAL_COMMUNITY): Payer: Self-pay | Admitting: General Practice

## 2012-02-07 DIAGNOSIS — I2510 Atherosclerotic heart disease of native coronary artery without angina pectoris: Secondary | ICD-10-CM

## 2012-02-07 DIAGNOSIS — I999 Unspecified disorder of circulatory system: Secondary | ICD-10-CM

## 2012-02-07 DIAGNOSIS — N186 End stage renal disease: Secondary | ICD-10-CM

## 2012-02-07 DIAGNOSIS — I739 Peripheral vascular disease, unspecified: Secondary | ICD-10-CM

## 2012-02-07 DIAGNOSIS — I428 Other cardiomyopathies: Secondary | ICD-10-CM

## 2012-02-07 DIAGNOSIS — R079 Chest pain, unspecified: Secondary | ICD-10-CM

## 2012-02-07 LAB — TROPONIN I
Troponin I: <0.3 ng/mL (ref <0.30)
Troponin I: <0.3 ng/mL (ref <0.30)

## 2012-02-07 LAB — GLUCOSE, CAPILLARY: Glucose-Capillary: 143 mg/dL — ABNORMAL HIGH (ref 70–99)

## 2012-02-07 LAB — APTT: aPTT: 56 seconds — ABNORMAL HIGH (ref 24–37)

## 2012-02-07 LAB — CBC
HCT: 34.1 % — ABNORMAL LOW (ref 36.0–46.0)
Hemoglobin: 10.7 g/dL — ABNORMAL LOW (ref 12.0–15.0)
RBC: 3.73 MIL/uL — ABNORMAL LOW (ref 3.87–5.11)
RDW: 14.5 % (ref 11.5–15.5)
WBC: 7 10*3/uL (ref 4.0–10.5)

## 2012-02-07 LAB — URINALYSIS, ROUTINE W REFLEX MICROSCOPIC
Glucose, UA: NEGATIVE mg/dL
Hgb urine dipstick: NEGATIVE
Protein, ur: NEGATIVE mg/dL

## 2012-02-07 LAB — URINE MICROSCOPIC-ADD ON

## 2012-02-07 LAB — BASIC METABOLIC PANEL
CO2: 22 mEq/L (ref 19–32)
Calcium: 8.8 mg/dL (ref 8.4–10.5)
Creatinine, Ser: 1.57 mg/dL — ABNORMAL HIGH (ref 0.50–1.10)
GFR calc non Af Amer: 29 mL/min — ABNORMAL LOW (ref >90)

## 2012-02-07 LAB — CREATININE, SERUM
GFR calc Af Amer: 36 mL/min — ABNORMAL LOW (ref >90)
GFR calc non Af Amer: 31 mL/min — ABNORMAL LOW (ref >90)

## 2012-02-07 MED ORDER — SODIUM CHLORIDE 0.9 % IJ SOLN
3.0000 mL | Freq: Two times a day (BID) | INTRAMUSCULAR | Status: DC
  Administered 2012-02-07: 3 mL via INTRAVENOUS

## 2012-02-07 MED ORDER — HYDRALAZINE HCL 20 MG/ML IJ SOLN: 10.0000 mg | INTRAMUSCULAR | Status: DC | PRN

## 2012-02-07 MED ORDER — INFLUENZA VIRUS VACC SPLIT PF IM SUSP
0.5000 mL | INTRAMUSCULAR | Status: AC
  Administered 2012-02-08: 0.5 mL via INTRAMUSCULAR
  Filled 2012-02-07: qty 0.5

## 2012-02-07 MED ORDER — SODIUM CHLORIDE 0.9 % IJ SOLN: 3.0000 mL | Freq: Two times a day (BID) | INTRAMUSCULAR | Status: DC

## 2012-02-07 MED ORDER — ENOXAPARIN SODIUM 30 MG/0.3ML ~~LOC~~ SOLN
30.0000 mg | SUBCUTANEOUS | Status: DC
  Administered 2012-02-07 – 2012-02-08 (×2): 30 mg via SUBCUTANEOUS
  Filled 2012-02-07 (×2): qty 0.3

## 2012-02-07 MED ORDER — INSULIN ASPART 100 UNIT/ML ~~LOC~~ SOLN
0.0000 [IU] | Freq: Three times a day (TID) | SUBCUTANEOUS | Status: DC
  Administered 2012-02-08: 1 [IU] via SUBCUTANEOUS

## 2012-02-07 MED ORDER — ONDANSETRON HCL 4 MG PO TABS: 4.0000 mg | ORAL_TABLET | Freq: Four times a day (QID) | ORAL | Status: DC | PRN

## 2012-02-07 MED ORDER — ALLOPURINOL 100 MG PO TABS
100.0000 mg | ORAL_TABLET | Freq: Every day | ORAL | Status: DC
  Administered 2012-02-07 – 2012-02-08 (×2): 100 mg via ORAL
  Filled 2012-02-07 (×2): qty 1

## 2012-02-07 MED ORDER — CARVEDILOL 6.25 MG PO TABS
6.2500 mg | ORAL_TABLET | Freq: Two times a day (BID) | ORAL | Status: DC
  Administered 2012-02-07 – 2012-02-08 (×3): 6.25 mg via ORAL
  Filled 2012-02-07 (×5): qty 1

## 2012-02-07 MED ORDER — ATORVASTATIN CALCIUM 40 MG PO TABS
40.0000 mg | ORAL_TABLET | Freq: Every day | ORAL | Status: DC
  Administered 2012-02-07: 40 mg via ORAL
  Filled 2012-02-07 (×2): qty 1

## 2012-02-07 MED ORDER — CLOPIDOGREL BISULFATE 75 MG PO TABS
75.0000 mg | ORAL_TABLET | Freq: Every day | ORAL | Status: DC
  Administered 2012-02-07 – 2012-02-08 (×2): 75 mg via ORAL
  Filled 2012-02-07 (×2): qty 1

## 2012-02-07 MED ORDER — SODIUM CHLORIDE 0.9 % IJ SOLN: 3.0000 mL | INTRAMUSCULAR | Status: DC | PRN

## 2012-02-07 MED ORDER — ACETAMINOPHEN 325 MG PO TABS
650.0000 mg | ORAL_TABLET | Freq: Four times a day (QID) | ORAL | Status: DC | PRN
  Administered 2012-02-07 – 2012-02-08 (×2): 650 mg via ORAL
  Filled 2012-02-07 (×2): qty 2

## 2012-02-07 MED ORDER — MORPHINE SULFATE 2 MG/ML IJ SOLN: 1.0000 mg | INTRAMUSCULAR | Status: DC | PRN

## 2012-02-07 MED ORDER — GABAPENTIN 300 MG PO CAPS
300.0000 mg | ORAL_CAPSULE | Freq: Two times a day (BID) | ORAL | Status: DC
  Administered 2012-02-07 – 2012-02-08 (×3): 300 mg via ORAL
  Filled 2012-02-07 (×4): qty 1

## 2012-02-07 MED ORDER — SODIUM CHLORIDE 0.9 % IV SOLN: 250.0000 mL | INTRAVENOUS | Status: DC | PRN

## 2012-02-07 MED ORDER — ONDANSETRON HCL 4 MG/2ML IJ SOLN: 4.0000 mg | Freq: Four times a day (QID) | INTRAMUSCULAR | Status: DC | PRN

## 2012-02-07 MED ORDER — ISOSORBIDE MONONITRATE ER 60 MG PO TB24
60.0000 mg | ORAL_TABLET | Freq: Every day | ORAL | Status: DC
  Administered 2012-02-07 – 2012-02-08 (×2): 60 mg via ORAL
  Filled 2012-02-07 (×2): qty 1

## 2012-02-07 MED ORDER — LEVOTHYROXINE SODIUM 125 MCG PO TABS
125.0000 ug | ORAL_TABLET | Freq: Every day | ORAL | Status: DC
  Administered 2012-02-07 – 2012-02-08 (×2): 125 ug via ORAL
  Filled 2012-02-07 (×3): qty 1

## 2012-02-07 MED ORDER — FUROSEMIDE 40 MG PO TABS
40.0000 mg | ORAL_TABLET | Freq: Two times a day (BID) | ORAL | Status: DC
  Administered 2012-02-07 – 2012-02-08 (×3): 40 mg via ORAL
  Filled 2012-02-07 (×5): qty 1

## 2012-02-07 NOTE — H&P (Signed)
PCP:   Georgianne Fick, MD   Chief Complaint:  cp  HPI: 78 yo female with h/o pvd s/p left bka, chf, ckd comes in with sscp without radiation or associated n/v/sob.  No abd pain no fevers no cough.  Pain free right now.  Last cath in 2011 and with known systolic dysfunction with ef around 30%.  No le edema.  No swelling.    Review of Systems:  O/w neg  Past Medical History: Past Medical History  Diagnosis Date  . Coronary artery disease   . TIA (transient ischemic attack)   . Hypertension   . Shingles   . Stroke   . Thyroid disease   . Anemia   . CAD (coronary artery disease) 02/08/2011  . Cardiomyopathy, idiopathic 02/08/2011  . Chronic kidney disease   . Angina     Takes Isosorbide  . Unstable angina 02/08/2011  . Myocardial infarct     x 3 unsure of years  . Irregular heartbeat   . Hypothyroidism     Takes Levothryroxine  . Hypothyroid 02/08/2011  . Ulcer   . GERD (gastroesophageal reflux disease)     takes Protonix and zantac  . Hx of transient ischemic attack (TIA)   . Memory loss   . CHF (congestive heart failure)     Takes Lasix  . Gout     takes allopurinol  . Peripheral vascular disease     left lower  leg  . Diabetes mellitus     type 2 NIDDM x 2 years; no meds  . Chronic kidney disease (CKD), stage IV (severe)   . Arthritis   . Shortness of breath    Past Surgical History  Procedure Date  . Cardiac catheterization   . Back surgery     [facility]  . Eye surgery     Left eye surgery; cataract removal  . Left bka 07/05/2011  . Av fistula placement   . Amputation 07/05/2011    Procedure: AMPUTATION BELOW KNEE;  Surgeon: Chuck Hint, MD;  Location: [facility] OR;  Service: Vascular;  Laterality: Left;    Medications: Prior to Admission medications   Medication Sig Start Date End Date Taking? Authorizing Provider  allopurinol (ZYLOPRIM) 100 MG tablet Take 100 mg by mouth daily.    Yes Historical Provider, MD  atorvastatin  (LIPITOR) 40 MG tablet Take 40 mg by mouth daily.     Yes Historical Provider, MD  carvedilol (COREG) 6.25 MG tablet Take 6.25 mg by mouth 2 (two) times daily with a meal.    Yes Historical Provider, MD  clopidogrel (PLAVIX) 75 MG tablet Take 75 mg by mouth daily.   Yes Historical Provider, MD  furosemide (LASIX) 40 MG tablet Take 40 mg by mouth 2 (two) times daily.    Yes Historical Provider, MD  gabapentin (NEURONTIN) 300 MG capsule Take 300 mg by mouth 2 (two) times daily. 03/24/11 03/23/12 Yes Delanna Notice, MD  isosorbide mononitrate (IMDUR) 60 MG 24 hr tablet Take 60 mg by mouth daily.    Yes Historical Provider, MD  levothyroxine (SYNTHROID, LEVOTHROID) 125 MCG tablet Take 1 tablet (125 mcg total) by mouth daily. 02/09/11  Yes Mihai Croitoru, MD  nitroGLYCERIN (NITROSTAT) 0.4 MG SL tablet Place 0.4 mg under the tongue every 5 (five) minutes as needed. For chest pain   Yes Historical Provider, MD  ranitidine (ZANTAC) 150 MG tablet Take 150 mg by mouth daily as needed. For heart burn.   Yes Historical Provider, MD  Allergies:   Allergies  Allergen Reactions  . Aspirin Nausea And Vomiting    [name]     Social History:  reports that she quit smoking about 25 years ago. She has never used smokeless tobacco. She reports that she does not drink alcohol or use illicit drugs.  Family History: Family History  Problem Relation Age of Onset  . Cancer Sister     STOMACH  . Diabetes Sister   . Cancer Brother     BONE  . Diabetes Brother   . Anesthesia problems Neg Hx   . Hypotension Neg Hx   . Malignant hyperthermia Neg Hx   . Pseudochol deficiency Neg Hx   . Hyperlipidemia Daughter   . Hypertension Daughter   . Heart disease Daughter     before age 77  . Kidney disease Daughter   . Heart attack Daughter   . Other Daughter     varicose veins    Physical Exam: Filed Vitals:   02/06/12 2345 02/07/12 0015 02/07/12 0049  02/07/12 0100  BP: 52/13 73/35 89/50  72/24  Pulse: 86 82 88 81  Resp: 20 21 21 18   SpO2: 100% 100% 100% 100%   General appearance: alert, cooperative and no distress Neck: no JVD and supple, symmetrical, trachea midline Lungs: clear to auscultation bilaterally Heart: regular rate and rhythm, S1, S2 normal, no murmur, click, rub or gallop Abdomen: soft, non-tender; bowel sounds normal; no masses,  no organomegaly Extremities: extremities normal, atraumatic, no cyanosis or edema left bka Pulses: 2+ and symmetric Skin: Skin color, texture, turgor normal. No rashes or lesions Neurologic: Grossly normal    Labs on Admission:   [facility] 02/06/12 2323  NA 139  K 3.8  CL 105  CO2 22  GLUCOSE 171*  BUN 38*  CREATININE 1.57*  CALCIUM 8.8  MG --  PHOS --    Basename 02/06/12 2323  WBC 7.2  NEUTROABS --  HGB 10.8*  HCT 34.2*  MCV 91.2  PLT 346    Radiological Exams on Admission: Dg Chest Portable 1 View  02/07/2012  *RADIOLOGY REPORT*  Clinical Data: Chest pain, history hypertension, coronary artery disease post MI, cardiomyopathy  PORTABLE CHEST - 1 VIEW  Comparison: Portable exam 2335 hours compared to 02/07/2011  Findings: Borderline enlargement of cardiac silhouette. Atherosclerotic calcification aorta. Mediastinal contours and pulmonary vascularity normal. Minimal bibasilar atelectasis. Lungs otherwise clear. Bones demineralized. No pleural effusion or pneumothorax.  IMPRESSION: Minimal bibasilar atelectasis.   Original Report Authenticated By: Ulyses Southward, M.D.     Assessment/Plan 78 yo female with atyp cp now resolved  Principal Problem:  *Chest pain Active Problems:  CKD (chronic kidney disease) stage 3, GFR 30-59 ml/min  CAD (coronary artery disease)  Cardiomyopathy, idiopathic  Critical lower limb ischemia  Peripheral vascular disease, unspecified  Cards has been called for consult.  Initial bp readings were all inaccurate pt is not really hypotensive.  ekg  nsr no acute changes.  Romi.  Tele.  Cont medical management.  Further w/u per cards.  cxr neg.  chf compensated.    Nhung Danko A 02/07/2012, 1:10 AM

## 2012-02-07 NOTE — Progress Notes (Signed)
Utilization review completed.  

## 2012-02-07 NOTE — Progress Notes (Signed)
Patient seen and admitted earlier this AM by my associate.  Agree with Assessment and plan in H and P please review for details.  Per H and P Cardiology to further evaluate patient.  Will await cardiology recommendations.   - Cardiac enzymes negative x 3 - Patient denies any chest discomfort during our encounter today.    Will reassess next am.  Penny Pia

## 2012-02-07 NOTE — ED Notes (Signed)
Patient IV access to Right AC. Restricted LUE. BPs obtained via RLE. BP cuff to RUE with noticeable changes informed EDP.

## 2012-02-08 DIAGNOSIS — E039 Hypothyroidism, unspecified: Secondary | ICD-10-CM

## 2012-02-08 LAB — TSH: TSH: 0.028 u[IU]/mL — ABNORMAL LOW (ref 0.350–4.500)

## 2012-02-08 LAB — CBC
MCH: 29 pg (ref 26.0–34.0)
MCV: 91 fL (ref 78.0–100.0)
Platelets: 329 10*3/uL (ref 150–400)
RBC: 3.66 MIL/uL — ABNORMAL LOW (ref 3.87–5.11)
RDW: 14.7 % (ref 11.5–15.5)
WBC: 5.6 10*3/uL (ref 4.0–10.5)

## 2012-02-08 LAB — BASIC METABOLIC PANEL
CO2: 22 mEq/L (ref 19–32)
Calcium: 9.1 mg/dL (ref 8.4–10.5)
Creatinine, Ser: 1.71 mg/dL — ABNORMAL HIGH (ref 0.50–1.10)
GFR calc Af Amer: 30 mL/min — ABNORMAL LOW (ref >90)
Sodium: 140 mEq/L (ref 135–145)

## 2012-02-08 LAB — HEMOGLOBIN A1C: Hgb A1c MFr Bld: 7.2 % — ABNORMAL HIGH (ref <5.7)

## 2012-02-08 MED ORDER — CARVEDILOL 12.5 MG PO TABS
12.5000 mg | ORAL_TABLET | Freq: Two times a day (BID) | ORAL | Status: DC
  Filled 2012-02-08 (×2): qty 1

## 2012-02-08 MED ORDER — CARVEDILOL 12.5 MG PO TABS: 12.5000 mg | ORAL_TABLET | Freq: Two times a day (BID) | ORAL | Status: DC

## 2012-02-08 NOTE — Progress Notes (Signed)
[facility] PROGRESS NOTE  ALYZE LAUF ZOX:[id number] DOB: May 17, 1923 DOA: 02/06/2012 PCP: Georgianne Fick, MD  Assessment/Plan: Chest pain: currently resolved.  CE neg to date. EKG with NSR with some Twave abnormalities. Echo 2012 EF 30% with grade 1 diastolic dysfunction. Continue BB and statin and imdur. Pt on plavix from hx stroke. Cardiology consult requested.  Active Problems:  CKD (chronic kidney disease) stage 3, GFR 30-59 ml/min : chart review indicates range 1.5-1.9. Currently 1.48. Will monitor closely CAD (coronary artery disease) : see #1 Cardiomyopathy , idiopathic. Echo in 2012 with EF 30%. Will repeat. Daily weights. Intake and output. Continue lasix. Cards consult Critical lower limb ischemia: chart review indicates s/p arteriogram 8/13 which yielded patent aortoiliac system and femoropopliteal system with an occluded anterior tibial and posterior tibial artery. A patent peroneal artery to the ankle. Thus there were no options for revascularization according to office notes.  Dm: Not on home meds. Will use SSI for glycemic control. Will check hgA1c CBG range 109-245. Hypothyroid: check TSH continue synthroid.  Hx stroke: baseline. Continue plavix and statin  Code Status: full Family Communication: pt at bedside Disposition Plan: home when readdy   Consultants:  cards  Procedures:  none  Antibiotics:  none  HPI/Subjective: Sitting on side of bed reading paper. Denies pain/dicomfort.  Objective: Filed Vitals:   02/07/12 0400 02/07/12 1358 02/07/12 2300 02/08/12 0500  BP: 160/82 130/68 135/74 146/72  Pulse: 80 80 86 72  Temp: 97.6 F (36.4 C) 98.4 F (36.9 C) 98.3 F (36.8 C) 98.1 F (36.7 C)  TempSrc: Oral Oral    Resp: 18 17 18 18   Height:      Weight:      SpO2: 97% 96% 97% 97%    Intake/Output Summary (Last 24 hours) at 02/08/12 0757 Last data filed at 02/07/12 1730  Gross per 24 hour  Intake    720 ml  Output    300 ml  Net     420 ml   Filed Weights   02/07/12 0300  Weight: 53.071 kg (117 lb)    Exam:   General:  Awake alert oriented to self and place  Cardiovascular: RRR No MGR trace LEE on rt. Left BKA  Respiratory: normal effort BSCTAB No wheeze/crackles  Abdomen: soft +BS non-tender to palpation  Data Reviewed: Basic Metabolic Panel:  Lab 02/07/12 8119 02/06/12 2323  NA -- 139  K -- 3.8  CL -- 105  CO2 -- 22  GLUCOSE -- 171*  BUN -- 38*  CREATININE 1.48* 1.57*  CALCIUM -- 8.8  MG -- --  PHOS -- --   Liver Function Tests: No results found for this basename: AST:5,ALT:5,ALKPHOS:5,BILITOT:5,PROT:5,ALBUMIN:5 in the last 168 hours No results found for this basename: LIPASE:5,AMYLASE:5 in the last 168 hours No results found for this basename: AMMONIA:5 in the last 168 hours CBC:  Lab 02/07/12 0229 02/06/12 2323  WBC 7.0 7.2  NEUTROABS -- --  HGB 10.7* 10.8*  HCT 34.1* 34.2*  MCV 91.4 91.2  PLT 342 346   Cardiac Enzymes:  Lab 02/07/12 1444 02/07/12 0856 02/07/12 0230  CKTOTAL -- -- --  CKMB -- -- --  CKMBINDEX -- -- --  TROPONINI <0.30 <0.30 <0.30   BNP (last 3 results) No results found for this basename: PROBNP:3 in the last 8760 hours CBG:  Lab 02/07/12 2004 02/07/12 1636 02/07/12 1112 02/07/12 0749 02/07/12 0339  GLUCAP 245* 143* 109* 146* 124*    No results found for this or any previous  visit (from the past 240 hour(s)).   Studies: Dg Chest Portable 1 View  02/07/2012  *RADIOLOGY REPORT*  Clinical Data: Chest pain, history hypertension, coronary artery disease post MI, cardiomyopathy  PORTABLE CHEST - 1 VIEW  Comparison: Portable exam 2335 hours compared to 02/07/2011  Findings: Borderline enlargement of cardiac silhouette. Atherosclerotic calcification aorta. Mediastinal contours and pulmonary vascularity normal. Minimal bibasilar atelectasis. Lungs otherwise clear. Bones demineralized. No pleural effusion or pneumothorax.  IMPRESSION: Minimal bibasilar atelectasis.    Original Report Authenticated By: Ulyses Southward, M.D.     Scheduled Meds:   . allopurinol  100 mg Oral Daily  . atorvastatin  40 mg Oral q1800  . carvedilol  6.25 mg Oral BID WC  . clopidogrel  75 mg Oral Q breakfast  . enoxaparin (LOVENOX) injection  30 mg Subcutaneous Q24H  . furosemide  40 mg Oral BID  . gabapentin  300 mg Oral BID  . influenza  inactive virus vaccine  0.5 mL Intramuscular Tomorrow-1000  . insulin aspart  0-9 Units Subcutaneous TID WC  . isosorbide mononitrate  60 mg Oral Daily  . levothyroxine  125 mcg Oral QAC breakfast  . sodium chloride  3 mL Intravenous Q12H  . sodium chloride  3 mL Intravenous Q12H   Continuous Infusions:   Principal Problem:  *Chest pain Active Problems:  CKD (chronic kidney disease) stage 3, GFR 30-59 ml/min  CAD (coronary artery disease)  Cardiomyopathy, idiopathic  Critical lower limb ischemia  Peripheral vascular disease, unspecified    Time spent: 30 minutes    Gwenyth Bender NP [facility]  If 8PM-8AM, please contact night-coverage at [email or web address], password [facility] 02/08/2012, 7:57 AM  LOS: 2 days

## 2012-02-08 NOTE — Consult Note (Signed)
Pt. Seen and examined. Agree with the NP/PA-C note as written.  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 TSH was ~200 at the time. She has had improvement in LVEF with thyroid replacement. She had chest pain which she thought was more gas. Not relieved by ranitidine and she took 3 nitros without much relief. Eventually she got nitrospray and the symptoms resolved. EKG shows resolution of TWI's which were present 1 year ago. No new ischemia. Troponins are negative.  I'm suspect given her mild branch CAD on cath last year that this is non-cardiac chest pain.  Given her PAD history and prior amputation, it would be reasonable to consider an outpatient lexiscan myoview in the office. Ok from my standpoint for discharge today. Follow-up with me in a few weeks after the outpatient stress test.  Chrystie Nose, MD, [facility] Attending Cardiologist [facility]

## 2012-02-08 NOTE — Progress Notes (Signed)
Utilization review completed.  

## 2012-02-08 NOTE — Progress Notes (Signed)
Patient seen and examined. Agree with note by Toya Smothers, NP. Patient came in with CP that has now resolved. She has ruled out. Follows with [facility] for a history of cardiomyopathy with an EF of 30%. We have consulted them to see if they would like to pursue any further cardiac workup prior to DC home. Otherwise will plan on DC home later today.  Peggye Pitt, MD [facility] Pager: [phone]

## 2012-02-08 NOTE — Consult Note (Signed)
Reason for Consult: Chest Pain     HPI: [name] is an 78 y.o. AAM female with a history of coronary artery disease, status post PTCA in the past. She has also been followed by Dr. Rennis Golden for new-onset heart failure with an EF of 30%-40%, secondary to myxedema.  Her most recent echo on 09/06/11 showed improvement of her EF to greater than 55% with no regional wall motion abnormalities.  It did show mild to moderate tricuspid regurgitation and mild pulmonary hypertension with a RVSP of 35 mmHG as well as stage 1 diastolic dysfunction and mild to moderate LVH. A left heart catheterization that was performed on October 05, 2010 revealed first diagonal with an ostial 40% lesion, mid diagonal just proximal to bifurcation showed 80% narrowing. It was a 1.5 mm vessel. She also has a history of bilateral lower extremity claudication symptoms.  Dr. Rennis Golden placed her on aspirin and Plavix for this and she is  s/p left BKA.  Her history is also significant for HTN, T2DM, dyslipidemia, CKD and severe hypothyroidism.  [name] presented to the ED today for chest pain. She reports that the chest pain was not like the chest pain she experienced with her previous MIs.  The pain was described as substernal, constant "hard pain".  Non radiating. Non pleuritic. No aggravating factors. Pain not worsened with movement or meals. Responsive to NTG. No associated SOB, diaphoresis, palpitations, n/v.  Pt denies orthopnea, PND and LEE.  She reports that she is currently pain free.  Past Medical History  Diagnosis Date  . Coronary artery disease   . TIA (transient ischemic attack)   . Hypertension   . Shingles   . Stroke   . Thyroid disease   . Anemia   . CAD (coronary artery disease) 02/08/2011  . Cardiomyopathy, idiopathic 02/08/2011  . Chronic kidney disease   . Angina     Takes Isosorbide  . Unstable angina 02/08/2011  . Myocardial infarct     x 3 unsure of years  . Irregular heartbeat   . Hypothyroidism    Takes Levothryroxine  . Hypothyroid 02/08/2011  . Ulcer   . GERD (gastroesophageal reflux disease)     takes Protonix and zantac  . Hx of transient ischemic attack (TIA)   . Memory loss   . CHF (congestive heart failure)     Takes Lasix  . Gout     takes allopurinol  . Peripheral vascular disease     left lower  leg  . Diabetes mellitus     type 2 NIDDM x 2 years; no meds  . Chronic kidney disease (CKD), stage IV (severe)   . Arthritis   . Shortness of breath     Past Surgical History  Procedure Date  . Cardiac catheterization   . Back surgery     [facility]  . Eye surgery     Left eye surgery; cataract removal  . Left bka 07/05/2011  . Av fistula placement   . Amputation 07/05/2011    Procedure: AMPUTATION BELOW KNEE;  Surgeon: Chuck Hint, MD;  Location: [facility] OR;  Service: Vascular;  Laterality: Left;  . Angioplasty 1988    Family History  Problem Relation Age of Onset  . Cancer Sister     STOMACH  . Diabetes Sister   . Cancer Brother     BONE  . Diabetes Brother   . Anesthesia problems Neg Hx   . Hypotension Neg Hx   .  Malignant hyperthermia Neg Hx   . Pseudochol deficiency Neg Hx   . Hyperlipidemia Daughter   . Hypertension Daughter   . Heart disease Daughter     before age 6  . Kidney disease Daughter   . Heart attack Daughter   . Other Daughter     varicose veins    Social History:  reports that she quit smoking about 25 years ago. She has never used smokeless tobacco. She reports that she does not drink alcohol or use illicit drugs.  Allergies:  Allergies  Allergen Reactions  . Aspirin Nausea And Vomiting    [name]     Medications:     . allopurinol  100 mg Oral Daily  . atorvastatin  40 mg Oral q1800  . carvedilol  12.5 mg Oral BID WC  . clopidogrel  75 mg Oral Q breakfast  . enoxaparin (LOVENOX) injection  30 mg Subcutaneous Q24H  . furosemide  40 mg Oral  BID  . gabapentin  300 mg Oral BID  . [COMPLETED] influenza  inactive virus vaccine  0.5 mL Intramuscular Tomorrow-1000  . insulin aspart  0-9 Units Subcutaneous TID WC  . isosorbide mononitrate  60 mg Oral Daily  . levothyroxine  125 mcg Oral QAC breakfast  . sodium chloride  3 mL Intravenous Q12H  . sodium chloride  3 mL Intravenous Q12H  . [DISCONTINUED] carvedilol  6.25 mg Oral BID WC     Results for orders placed during the hospital encounter of 02/06/12 (from the past 48 hour(s))  CBC     Status: Abnormal   Collection Time   02/06/12 11:23 PM      Component Value Range Comment   WBC 7.2  4.0 - 10.5 K/uL    RBC 3.75 (*) 3.87 - 5.11 MIL/uL    Hemoglobin 10.8 (*) 12.0 - 15.0 g/dL    HCT 16.1 (*) 09.6 - 46.0 %    MCV 91.2  78.0 - 100.0 fL    MCH 28.8  26.0 - 34.0 pg    MCHC 31.6  30.0 - 36.0 g/dL    RDW 04.5  40.9 - 81.1 %    Platelets 346  150 - 400 K/uL   BASIC METABOLIC PANEL     Status: Abnormal   Collection Time   02/06/12 11:23 PM      Component Value Range Comment   Sodium 139  135 - 145 mEq/L    Potassium 3.8  3.5 - 5.1 mEq/L    Chloride 105  96 - 112 mEq/L    CO2 22  19 - 32 mEq/L    Glucose, Bld 171 (*) 70 - 99 mg/dL    BUN 38 (*) 6 - 23 mg/dL    Creatinine, Ser 9.14 (*) 0.50 - 1.10 mg/dL    Calcium 8.8  8.4 - 78.2 mg/dL    GFR calc non Af Amer 29 (*) >90 mL/min    GFR calc Af Amer 33 (*) >90 mL/min   APTT     Status: Abnormal   Collection Time   02/06/12 11:24 PM      Component Value Range Comment   aPTT 56 (*) 24 - 37 seconds   PROTIME-INR     Status: Normal   Collection Time   02/06/12 11:24 PM      Component Value Range Comment   Prothrombin Time 13.2  11.6 - 15.2 seconds    INR 1.01  0.00 - 1.49   POCT I-STAT TROPONIN I     Status: Normal   Collection Time   02/06/12 11:33 PM      Component Value Range Comment   Troponin i, poc 0.01  0.00 - 0.08 ng/mL    Comment 3            URINALYSIS, ROUTINE W REFLEX MICROSCOPIC     Status: Abnormal    Collection Time   02/07/12  1:28 AM      Component Value Range Comment   Color, Urine YELLOW  YELLOW    APPearance CLOUDY (*) CLEAR    Specific Gravity, Urine 1.007  1.005 - 1.030    pH 5.5  5.0 - 8.0    Glucose, UA NEGATIVE  NEGATIVE mg/dL    Hgb urine dipstick NEGATIVE  NEGATIVE    Bilirubin Urine NEGATIVE  NEGATIVE    Ketones, ur NEGATIVE  NEGATIVE mg/dL    Protein, ur NEGATIVE  NEGATIVE mg/dL    Urobilinogen, UA 0.2  0.0 - 1.0 mg/dL    Nitrite NEGATIVE  NEGATIVE    Leukocytes, UA SMALL (*) NEGATIVE   URINE MICROSCOPIC-ADD ON     Status: Abnormal   Collection Time   02/07/12  1:28 AM      Component Value Range Comment   Squamous Epithelial / LPF FEW (*) RARE    WBC, UA 11-20  <3 WBC/hpf    Bacteria, UA RARE  RARE   CBC     Status: Abnormal   Collection Time   02/07/12  2:29 AM      Component Value Range Comment   WBC 7.0  4.0 - 10.5 K/uL    RBC 3.73 (*) 3.87 - 5.11 MIL/uL    Hemoglobin 10.7 (*) 12.0 - 15.0 g/dL    HCT 14.7 (*) 82.9 - 46.0 %    MCV 91.4  78.0 - 100.0 fL    MCH 28.7  26.0 - 34.0 pg    MCHC 31.4  30.0 - 36.0 g/dL    RDW 56.2  13.0 - 86.5 %    Platelets 342  150 - 400 K/uL   CREATININE, SERUM     Status: Abnormal   Collection Time   02/07/12  2:29 AM      Component Value Range Comment   Creatinine, Ser 1.48 (*) 0.50 - 1.10 mg/dL    GFR calc non Af Amer 31 (*) >90 mL/min    GFR calc Af Amer 36 (*) >90 mL/min   TROPONIN I     Status: Normal   Collection Time   02/07/12  2:30 AM      Component Value Range Comment   Troponin I <0.30  <0.30 ng/mL   GLUCOSE, CAPILLARY     Status: Abnormal   Collection Time   02/07/12  3:39 AM      Component Value Range Comment   Glucose-Capillary 124 (*) 70 - 99 mg/dL   GLUCOSE, CAPILLARY     Status: Abnormal   Collection Time   02/07/12  7:49 AM      Component Value Range Comment   Glucose-Capillary 146 (*) 70 - 99 mg/dL   TROPONIN I     Status: Normal   Collection Time   02/07/12  8:56 AM      Component Value  Range Comment   Troponin I <0.30  <0.30 ng/mL   GLUCOSE, CAPILLARY     Status: Abnormal   Collection Time   02/07/12 11:12 AM  Component Value Range Comment   Glucose-Capillary 109 (*) 70 - 99 mg/dL   TROPONIN I     Status: Normal   Collection Time   02/07/12  2:44 PM      Component Value Range Comment   Troponin I <0.30  <0.30 ng/mL   GLUCOSE, CAPILLARY     Status: Abnormal   Collection Time   02/07/12  4:36 PM      Component Value Range Comment   Glucose-Capillary 143 (*) 70 - 99 mg/dL   GLUCOSE, CAPILLARY     Status: Abnormal   Collection Time   02/07/12  8:04 PM      Component Value Range Comment   Glucose-Capillary 245 (*) 70 - 99 mg/dL   GLUCOSE, CAPILLARY     Status: Abnormal   Collection Time   02/08/12  7:25 AM      Component Value Range Comment   Glucose-Capillary 126 (*) 70 - 99 mg/dL   CBC     Status: Abnormal   Collection Time   02/08/12  9:51 AM      Component Value Range Comment   WBC 5.6  4.0 - 10.5 K/uL    RBC 3.66 (*) 3.87 - 5.11 MIL/uL    Hemoglobin 10.6 (*) 12.0 - 15.0 g/dL    HCT 16.1 (*) 09.6 - 46.0 %    MCV 91.0  78.0 - 100.0 fL    MCH 29.0  26.0 - 34.0 pg    MCHC 31.8  30.0 - 36.0 g/dL    RDW 04.5  40.9 - 81.1 %    Platelets 329  150 - 400 K/uL   BASIC METABOLIC PANEL     Status: Abnormal   Collection Time   02/08/12  9:51 AM      Component Value Range Comment   Sodium 140  135 - 145 mEq/L    Potassium 3.9  3.5 - 5.1 mEq/L    Chloride 105  96 - 112 mEq/L    CO2 22  19 - 32 mEq/L    Glucose, Bld 213 (*) 70 - 99 mg/dL    BUN 32 (*) 6 - 23 mg/dL    Creatinine, Ser 9.14 (*) 0.50 - 1.10 mg/dL    Calcium 9.1  8.4 - 78.2 mg/dL    GFR calc non Af Amer 26 (*) >90 mL/min    GFR calc Af Amer 30 (*) >90 mL/min     Dg Chest Portable 1 View  02/07/2012  *RADIOLOGY REPORT*  Clinical Data: Chest pain, history hypertension, coronary artery disease post MI, cardiomyopathy  PORTABLE CHEST - 1 VIEW  Comparison: Portable exam 2335 hours compared to  02/07/2011  Findings: Borderline enlargement of cardiac silhouette. Atherosclerotic calcification aorta. Mediastinal contours and pulmonary vascularity normal. Minimal bibasilar atelectasis. Lungs otherwise clear. Bones demineralized. No pleural effusion or pneumothorax.  IMPRESSION: Minimal bibasilar atelectasis.   Original Report Authenticated By: Ulyses Southward, M.D.     Review of Systems  Constitutional: Negative for fever, chills, weight loss and diaphoresis.  HENT: Negative for neck pain.   Respiratory: Negative for cough and shortness of breath.   Cardiovascular: Positive for chest pain. Negative for palpitations, orthopnea, leg swelling and PND.  Gastrointestinal: Negative for nausea, vomiting, abdominal pain, constipation, blood in stool and melena.  Genitourinary: Negative for dysuria and hematuria.  Musculoskeletal: Negative for back pain.  Neurological: Negative for dizziness.   Blood pressure 160/76, pulse 72, temperature 98.1 F (36.7 C), temperature source Oral, resp. rate 18, height  4\' 9"  (1.448 m), weight 53.071 kg (117 lb), SpO2 97.00%. Physical Exam  Constitutional: She is oriented to person, place, and time. She appears well-developed and well-nourished. No distress.  HENT:  Head: Normocephalic and atraumatic.  Eyes: EOM are normal.  Neck: Normal range of motion. Neck supple.  Cardiovascular: Normal rate, regular rhythm and normal heart sounds.  Exam reveals no gallop and no friction rub.   No murmur heard. Pulses:      Radial pulses are 2+ on the right side, and 2+ on the left side.       Dorsalis pedis pulses are 1+ on the right side. Left dorsalis pedis pulse not accessible.  Respiratory: Effort normal and breath sounds normal. She has no wheezes. She has no rales. She exhibits no tenderness.  GI: Soft. Bowel sounds are normal. She exhibits no distension. There is no tenderness.  Musculoskeletal: She exhibits no edema.  Lymphadenopathy:    She has no cervical  adenopathy.  Neurological: She is alert and oriented to person, place, and time.  Skin: Skin is warm and dry.  Psychiatric: She has a normal mood and affect.    Assessment/Plan: Pt is CP free. Troponin negative x 3. EKG shows NSR with 1st degree AVB and no acute changes. Recommend OP lexiscan myoview.  Will titrate Coreg to 12.5 mg BID. Consider titrating Imdur to 90 mg daily.  MD to follow. Possible D/C today.   Hopie Pellegrin 02/08/2012, 11:07 AM

## 2012-02-08 NOTE — Discharge Summary (Signed)
Physician Discharge Summary  [name] NGE:[id number] DOB: 1923/07/21 DOA: 02/06/2012  PCP: Georgianne Fick, MD  Admit date: 02/06/2012 Discharge date: 02/08/2012  Time spent: 40 minutes  Recommendations for Outpatient Follow-up:  1. Pt medically stable and ready for discharge to home with family. Dr. Blanchie Dessert office will contact for scheduling of OP myoview  Discharge Diagnoses:  Principal Problem:  *Chest pain Active Problems:  CKD (chronic kidney disease) stage 3, GFR 30-59 ml/min  CAD (coronary artery disease)  Cardiomyopathy, idiopathic  Critical lower limb ischemia  Peripheral vascular disease, unspecified   Discharge Condition: medically stable and ready for discharge to home  Diet recommendation: heart healthy  Filed Weights   02/07/12 0300  Weight: 53.071 kg (117 lb)    History of present illness:   78 yo female with h/o pvd s/p left bka, chf, ckd presented to ED on 11/19 with sscp without radiation or associated n/v/sob. No abd pain no fevers no cough. Pain free at time of exam in the ED by hospitalist. Last cath in 2011 and with known systolic dysfunction with ef around 30%. No le edema. No swelling. [facility] asked to admit   Hospital Course:  Chest pain: No further CP.  CE neg. EKG with NSR with some Twave abnormalities. Echo 2012 EF 30% with grade 1 diastolic dysfunction. Seen by cardiology. Continue BB and statin and imdur. Pt on plavix from hx stroke. Cardiology will contact patient of OP myoview.   Active Problems:  CKD (chronic kidney disease) stage 3, GFR 30-59 ml/min : chart review indicates range 1.5-1.9. Currently 1.48.   CAD (coronary artery disease) : see #1 Coreg increased  Cardiomyopathy , idiopathic. Echo in 2012 with EF 30%. Seen [facility]. Continue lasix. Cards consult. Will be discharged on pre-admission regimen.    Critical lower limb ischemia: chart review indicates s/p arteriogram 8/13 which yielded patent aortoiliac system and  femoropopliteal system with an occluded anterior tibial and posterior tibial artery. A patent peroneal artery to the ankle. Thus there were no options for revascularization according to office notes.   Dm: Not on home meds. CBG range during this hospitalization 109-245. A1c pending at discharge. Will need OP follow up with PCP in 1-2 weeks for evaluation of A1c and optimal glycemic control.  Hypothyroid: TSH pending at discharge. Will need to follow up with PCP 1-2 weeks. Continue synthroid.   Hx stroke: baseline. Continue plavix and statin      Procedures:    Consultations:  Card  Discharge Exam: Filed Vitals:   02/07/12 2300 02/08/12 0500 02/08/12 0900 02/08/12 1400  BP: 135/74 146/72 160/76 131/74  Pulse: 86 72  72  Temp: 98.3 F (36.8 C) 98.1 F (36.7 C)  98.1 F (36.7 C)  TempSrc:      Resp: 18 18  17   Height:      Weight:      SpO2: 97% 97%  96%    General: awake alert oriented  Cardiovascular: RRR No MGR  Respiratory: Normal effort BSCTAB No wheeze  Discharge Instructions  Discharge Orders    Future Appointments: Provider: Department: Dept Phone: Center:   05/23/2012 1:30 PM [facility] [phone] [facility]   05/23/2012 2:00 PM Chuck Hint, MD [facility] [phone] [facility]     Future Orders Please Complete By Expires   Diet - low sodium heart healthy      Increase activity slowly      Discharge instructions  Comments:   Cardiology will contact to schedule OP Myoview   Call MD for:  difficulty breathing, headache or visual disturbances      Call MD for:  persistant dizziness or light-headedness          Medication List     As of 02/08/2012  3:09 PM    TAKE these medications         allopurinol 100 MG tablet   Commonly known as: ZYLOPRIM   Take 100 mg by mouth daily.      atorvastatin 40 MG tablet   Commonly known as: LIPITOR   Take 40 mg by mouth daily.       carvedilol 12.5 MG tablet   Commonly known as: COREG   Take 1 tablet (12.5 mg total) by mouth 2 (two) times daily with a meal.      clopidogrel 75 MG tablet   Commonly known as: PLAVIX   Take 75 mg by mouth daily.      furosemide 40 MG tablet   Commonly known as: LASIX   Take 40 mg by mouth 2 (two) times daily.      gabapentin 300 MG capsule   Commonly known as: NEURONTIN   Take 300 mg by mouth 2 (two) times daily.      isosorbide mononitrate 60 MG 24 hr tablet   Commonly known as: IMDUR   Take 60 mg by mouth daily.      levothyroxine 125 MCG tablet   Commonly known as: SYNTHROID, LEVOTHROID   Take 1 tablet (125 mcg total) by mouth daily.      nitroGLYCERIN 0.4 MG SL tablet   Commonly known as: NITROSTAT   Place 0.4 mg under the tongue every 5 (five) minutes as needed. For chest pain      ranitidine 150 MG tablet   Commonly known as: ZANTAC   Take 150 mg by mouth daily as needed. For heart burn.           Follow-up Information    Follow up with [facility], MD. In 2 weeks.   Contact information:   [address] [place] [address] [phone]       Follow up with HILTY,Kenneth C, MD. In 1 week. (office will contact patient for OP myoview)    Contact information:   [address] [name] [address] [phone]           The results of significant diagnostics from this hospitalization (including imaging, microbiology, ancillary and laboratory) are listed below for reference.    Significant Diagnostic Studies: Dg Chest Portable 1 View  02/07/2012  *RADIOLOGY REPORT*  Clinical Data: Chest pain, history hypertension, coronary artery disease post MI, cardiomyopathy  PORTABLE CHEST - 1 VIEW  Comparison: Portable exam 2335 hours compared to 02/07/2011  Findings: Borderline enlargement of cardiac silhouette. Atherosclerotic calcification aorta. Mediastinal contours and pulmonary vascularity normal. Minimal bibasilar  atelectasis. Lungs otherwise clear. Bones demineralized. No pleural effusion or pneumothorax.  IMPRESSION: Minimal bibasilar atelectasis.   Original Report Authenticated By: Ulyses Southward, M.D.     Microbiology: No results found for this or any previous visit (from the past 240 hour(s)).   Labs: Basic Metabolic Panel:  Lab 02/08/12 5784 02/07/12 0229 02/06/12 2323  NA 140 -- 139  K 3.9 -- 3.8  CL 105 -- 105  CO2 22 -- 22  GLUCOSE 213* -- 171*  BUN 32* -- 38*  CREATININE 1.71* 1.48* 1.57*  CALCIUM 9.1 -- 8.8  MG -- -- --  PHOS -- -- --   Liver Function Tests: No results found for this basename: AST:5,ALT:5,ALKPHOS:5,BILITOT:5,PROT:5,ALBUMIN:5 in the last 168 hours No results found for this basename: LIPASE:5,AMYLASE:5 in the last 168 hours No results found for this basename: AMMONIA:5 in the last 168 hours CBC:  Lab 02/08/12 0951 02/07/12 0229 02/06/12 2323  WBC 5.6 7.0 7.2  NEUTROABS -- -- --  HGB 10.6* 10.7* 10.8*  HCT 33.3* 34.1* 34.2*  MCV 91.0 91.4 91.2  PLT 329 342 346   Cardiac Enzymes:  Lab 02/07/12 1444 02/07/12 0856 02/07/12 0230  CKTOTAL -- -- --  CKMB -- -- --  CKMBINDEX -- -- --  TROPONINI <0.30 <0.30 <0.30   BNP: BNP (last 3 results) No results found for this basename: PROBNP:3 in the last 8760 hours CBG:  Lab 02/08/12 1141 02/08/12 0725 02/07/12 2004 02/07/12 1636 02/07/12 1112  GLUCAP 113* 126* 245* 143* 109*       Signed:  Gwenyth Bender NP [facility] 02/08/2012, 3:09 PM

## 2012-02-08 NOTE — Discharge Summary (Signed)
Patient seen and examined. Agree with note by Toya Smothers, NP. Patient came in with CP. She has a known history of cardiomyopathy with EF of 30% presumed to be from hypothyroidism. She has ruled out. Given her h/o of cardiomyopathy we consulted cardiology who has decided to perform an outpatient stress test. They will contact the patient with the details. She is stable for DC home today.  Peggye Pitt, MD [facility] Pager: [phone]

## 2012-02-08 NOTE — Progress Notes (Signed)
Pt and pt daughter provided with dc instructions and education. Pt aware that she is to follow up with [facility] cardiology for OP myoview. Pt educated on new medications and how/when to take them. VSS. Heart monitor cleaned and returned to front. Pt has no questions at this time. Levonne Spiller, RN

## 2012-05-22 ENCOUNTER — Encounter: Payer: Self-pay | Admitting: Vascular Surgery

## 2012-05-23 ENCOUNTER — Encounter: Payer: Self-pay | Admitting: Vascular Surgery

## 2012-05-23 ENCOUNTER — Encounter (INDEPENDENT_AMBULATORY_CARE_PROVIDER_SITE_OTHER): Payer: Medicare Other | Admitting: *Deleted

## 2012-05-23 ENCOUNTER — Ambulatory Visit (INDEPENDENT_AMBULATORY_CARE_PROVIDER_SITE_OTHER): Payer: Medicare Other | Admitting: Vascular Surgery

## 2012-05-23 VITALS — BP 178/59 | HR 73 | Ht <= 58 in | Wt 115.0 lb

## 2012-05-23 DIAGNOSIS — I739 Peripheral vascular disease, unspecified: Secondary | ICD-10-CM

## 2012-05-23 DIAGNOSIS — I7092 Chronic total occlusion of artery of the extremities: Secondary | ICD-10-CM

## 2012-05-23 DIAGNOSIS — Z48812 Encounter for surgical aftercare following surgery on the circulatory system: Secondary | ICD-10-CM

## 2012-05-23 NOTE — Progress Notes (Signed)
[facility] [place]  Patient name: [name] MRN: [id number] DOB: 1923-10-12 Sex: female  REASON FOR VISIT: Follow up of peripheral vascular disease.  HPI: [name] is a 79 y.o. female who has undergone previous left below the knee amputation. She comes in for a routine follow up visit. She had been complaining of some right knee pain recently but this has resolved. She does not have a prosthesis. She is confined to her wheelchair. She denies rest pain or paresthesias in the right foot.  There has been no significant change in her medical history.  REVIEW OF SYSTEMS: [name] ] denotes positive finding; [  ] denotes negative finding  CARDIOVASCULAR:  [ ]  chest pain   [ ]  dyspnea on exertion    CONSTITUTIONAL:  [ ]  fever   [ ]  chills  PHYSICAL EXAM: Filed Vitals:   05/23/12 1134  BP: 178/59  Pulse: 73  Height: 4\' 9"  (1.448 m)  Weight: 115 lb (52.164 kg)  SpO2: 98%   Body mass index is 24.88 kg/(m^2). GENERAL: The patient is a well-nourished female, in no acute distress. The vital signs are documented above. CARDIOVASCULAR: There is a regular rate and rhythm  PULMONARY: There is good air exchange bilaterally without wheezing or rales. I cannot palpate pulses in the right foot. The right foot is warm and well-perfused. There are no ulcers on the right foot. The left and the linea amputation site looks fine.  I have independently interpreted her arterial Doppler study which shows monophasic Doppler signals in the peroneal and posterior tibial positions on the right. She also has a monophasic anterior tibial signal. The arteries are noncompressible and therefore ABIs cannot be obtained.  MEDICAL ISSUES: Is patient has stable peripheral vascular disease. I've encouraged her to stay as active as possible. Fortunately she is not a smoker. I'll plan on seeing her back in 9 months. She knows to call sooner if she has problems.  DICKSON,CHRISTOPHER S [facility] [place] Beeper: [phone]

## 2012-09-11 ENCOUNTER — Other Ambulatory Visit (HOSPITAL_COMMUNITY): Payer: Self-pay | Admitting: Cardiovascular Disease

## 2012-09-11 ENCOUNTER — Telehealth (HOSPITAL_COMMUNITY): Payer: Self-pay | Admitting: Cardiovascular Disease

## 2012-09-11 DIAGNOSIS — IMO0001 Reserved for inherently not codable concepts without codable children: Secondary | ICD-10-CM

## 2012-09-11 NOTE — Telephone Encounter (Signed)
LEFT MESSAGE TO CALL BACK REGARDING SCHEDULING AN APPT.Marland Kitchen ALSO SENT A LETTER

## 2012-11-02 ENCOUNTER — Ambulatory Visit: Payer: Medicare Other | Admitting: Internal Medicine

## 2012-11-02 ENCOUNTER — Encounter: Payer: Self-pay | Admitting: Internal Medicine

## 2012-11-02 ENCOUNTER — Ambulatory Visit (INDEPENDENT_AMBULATORY_CARE_PROVIDER_SITE_OTHER): Payer: Medicare Other | Admitting: Internal Medicine

## 2012-11-02 VITALS — BP 138/68 | HR 76 | Ht <= 58 in | Wt 120.0 lb

## 2012-11-02 DIAGNOSIS — R0600 Dyspnea, unspecified: Secondary | ICD-10-CM

## 2012-11-02 DIAGNOSIS — I429 Cardiomyopathy, unspecified: Secondary | ICD-10-CM

## 2012-11-02 DIAGNOSIS — Z79899 Other long term (current) drug therapy: Secondary | ICD-10-CM

## 2012-11-02 DIAGNOSIS — I70229 Atherosclerosis of native arteries of extremities with rest pain, unspecified extremity: Secondary | ICD-10-CM

## 2012-11-02 DIAGNOSIS — I2510 Atherosclerotic heart disease of native coronary artery without angina pectoris: Secondary | ICD-10-CM

## 2012-11-02 DIAGNOSIS — R0609 Other forms of dyspnea: Secondary | ICD-10-CM

## 2012-11-02 DIAGNOSIS — I998 Other disorder of circulatory system: Secondary | ICD-10-CM

## 2012-11-02 DIAGNOSIS — N183 Chronic kidney disease, stage 3 unspecified: Secondary | ICD-10-CM

## 2012-11-02 DIAGNOSIS — I999 Unspecified disorder of circulatory system: Secondary | ICD-10-CM

## 2012-11-02 DIAGNOSIS — N186 End stage renal disease: Secondary | ICD-10-CM

## 2012-11-02 DIAGNOSIS — I428 Other cardiomyopathies: Secondary | ICD-10-CM

## 2012-11-02 NOTE — Patient Instructions (Addendum)
Your physician recommends that you return for lab work in the next few days. Dr Rennis Golden wants to see you back in ONE WEEK.   Take your Furosemide (Lasix) 80mg  two times a day.

## 2012-11-03 ENCOUNTER — Encounter: Payer: Self-pay | Admitting: Internal Medicine

## 2012-11-03 NOTE — Progress Notes (Signed)
OFFICE NOTE  Chief Complaint:  Routine followup  Primary Care Physician: Georgianne Fick, MD  HPI:  [name] is a pleasant 79 year old female with an unfortunate history of heart failure in the past due to severe hypothyroidism. EF has subsequently normalized. However, she does have significant peripheral arterial disease; underwent a left BKA. When I saw her last on December 13, she was having problems with rest pain in her right foot, and discoloration of the sole of her foot and toes, with tenderness to palpation. I had prescribed her for pain medicine and had reviewed her lower extremity arteriogram with Dr. Allyson Sabal, and felt that there were very few options for revascularization. She returned today and has reported some improvement with reduction in pain, and there does appear to be less discoloration. However, pulses still remain very weak. Her family reports she's also had a mild cough and does seem a little bit more fatigued. She did not report any worsening shortness of breath or orthopnea but has had some difficulty transferring.   PMHx:  Past Medical History  Diagnosis Date  . Coronary artery disease   . TIA (transient ischemic attack)   . Hypertension   . Shingles   . Stroke   . Anemia   . CAD (coronary artery disease) 02/08/2011  . Cardiomyopathy, idiopathic 02/08/2011  . Chronic kidney disease   . Angina     Takes Isosorbide  . Unstable angina 02/08/2011  . Myocardial infarct     x 3 unsure of years  . Irregular heartbeat   . Ulcer   . GERD (gastroesophageal reflux disease)     takes Protonix and zantac  . Hx of transient ischemic attack (TIA)   . Memory loss   . CHF (congestive heart failure)     Takes Lasix  . Gout     takes allopurinol  . Peripheral vascular disease     left lower  leg  . Diabetes mellitus     type 2 NIDDM x 2 years; no meds  . Chronic kidney disease (CKD), stage IV (severe)   . Arthritis   . Shortness of breath   . Thyroid  disease   . Hypothyroidism     (SEVERE) Takes Levothryroxine  . Hypothyroid 02/08/2011  . Nonischemic cardiomyopathy     EF now is 55%, reduced due to myxedema, which is improved.  . Dyslipidemia   . Peripheral neuropathy   . H/O echocardiogram 09/06/11    Indication- nonIschemic Cardiomyopathy. EF = now greater than 55% with no regional wall motion abnormalities. Tthere is mild to moderate trisuspid regurgitayion and mild pulmonary hypertension with an RVSP of 35 mmHg as well as stage 1 diastolic dysfunction and mild to moderate LVH.  Marland Kitchen Abnormal nuclear stress test 06/01/09    Demonstrated a new area of infarct scar, peri-infarct ischemia seen in the inferolateral territory. EF eas normal at 70% with mild hypocontractility at the apex, distal inferolateral wall.    Past Surgical History  Procedure Laterality Date  . Back surgery      [facility]  . Eye surgery      Left eye surgery; cataract removal  . Left bka  07/05/2011  . Av fistula placement    . Amputation  07/05/2011    Procedure: AMPUTATION BELOW KNEE;  Surgeon: Chuck Hint, MD;  Location: [facility] OR;  Service: Vascular;  Laterality: Left;  . Angioplasty  1988  . Cardiac catheterization  09/28/07    Demonstrated multiple sequential lesions around  40 to 30% in the RCA territory.  . Left lower extremity venous duplex Left 06/27/11    Summary: No evidence of DVT involving the left lower extremity and right common femoral vein.   . Lower extremity arterial evaluation  06/27/11    SUMMARY: Right: ABI not ascertained due to false elevation in BP secondary to calcification (posterior tibial artery is non compressible). Left: ABI indicates moderate reduction in arterial flow. Bilateral: Great toe PPG waveforms indicate adequate perfusion. Great toe pressures not obtained due to patient's movements secondary to pain.  . Duplex doppler  05/10/11    LE arterial dopplers demonstrate bilaterally reduced ABIs of 0.91 on right & 0.56 on  left. She does report some decreased pain on the left, & there's moderate mixed-density plaque in the right SFA w/50 to 69% reduction. There's a 69% reduction in the left SFA & does appear to be occlusive disease of left posterior tibial artery. Right posterior dorsalis pedis artery demonstrates occlusive disease    FAMHx:  Family History  Problem Relation Age of Onset  . Cancer Sister     STOMACH  . Diabetes Sister   . Cancer Brother     BONE  . Diabetes Brother   . Anesthesia problems Neg Hx   . Hypotension Neg Hx   . Malignant hyperthermia Neg Hx   . Pseudochol deficiency Neg Hx   . Hyperlipidemia Daughter   . Hypertension Daughter   . Heart disease Daughter     before age 87  . Kidney disease Daughter   . Heart attack Daughter   . Other Daughter     varicose veins  . Diabetes Daughter   . Heart disease Son     before age 69  . Hyperlipidemia Son   . Hypertension Son   . Heart attack Son     SOCHx:   reports that she quit smoking about 26 years ago. She has never used smokeless tobacco. She reports that she does not drink alcohol or use illicit drugs.  ALLERGIES:  Allergies  Allergen Reactions  . Aspirin Nausea And Vomiting    325 mg (adult strength) [name]     ROS: A comprehensive review of systems was negative except for: Respiratory: positive for cough and dyspnea on exertion  HOME MEDS: Current Outpatient Prescriptions  Medication Sig Dispense Refill  . acetaminophen (TYLENOL) 500 MG tablet Take 500 mg by mouth every 6 (six) hours as needed for pain.      Marland Kitchen allopurinol (ZYLOPRIM) 100 MG tablet Take 100 mg by mouth daily.       Marland Kitchen atorvastatin (LIPITOR) 40 MG tablet Take 40 mg by mouth daily.        . carvedilol (COREG) 12.5 MG tablet Take 1 tablet (12.5 mg total) by mouth 2 (two) times daily with a meal.  30 tablet  0  . clopidogrel (PLAVIX) 75 MG tablet Take 75 mg by mouth daily.      .  furosemide (LASIX) 40 MG tablet Take 80 mg by mouth 2 (two) times daily.       Marland Kitchen gabapentin (NEURONTIN) 300 MG capsule Take 300 mg by mouth 2 (two) times daily.      . isosorbide mononitrate (IMDUR) 60 MG 24 hr tablet Take 60 mg by mouth daily.       Marland Kitchen levothyroxine (SYNTHROID, LEVOTHROID) 125 MCG tablet Take 1 tablet (125 mcg total) by mouth daily.  30 tablet  11  . nitroGLYCERIN (NITROSTAT) 0.4 MG SL tablet Place 0.4 mg under the tongue every 5 (five) minutes as needed. For chest pain      . ranitidine (ZANTAC) 150 MG tablet Take 150 mg by mouth daily as needed. For heart burn.      . Saxagliptin HCl (ONGLYZA PO) Take by mouth daily.       No current facility-administered medications for this visit.    LABS/IMAGING: No results found for this or any previous visit (from the past 48 hour(s)). No results found.  VITALS: BP 138/68  Pulse 76  Ht 4\' 9"  (1.448 m)  Wt 120 lb (54.432 kg)  BMI 25.96 kg/m2  EXAM: General appearance: alert and no distress Neck: JVD - 5 cm above sternal notch, no adenopathy, no carotid bruit, supple, symmetrical, trachea midline and thyroid not enlarged, symmetric, no tenderness/mass/nodules Lungs: diminished breath sounds bilaterally and rales bibasilar Heart: regular rate and rhythm Abdomen: soft, mildly protuberant Extremities: edema 1+ pitting edema on the right, left is BKA Pulses: faint pulse in the right foot Skin: Skin color, texture, turgor normal. No rashes or lesions Neurologic: Mental status: Alert, oriented, thought content appropriate  EKG: Normal sinus rhythm at 76 with T-wave inversions unchanged  ASSESSMENT: 1. Acute on chronic systolic heart failure exacerbation 2. PAD with left BKA 3. History of severe hypothyroidism  PLAN: 1.   Mrs. Lair appears to be having a heart failure exacerbation. There is fluid in the lungs and her JVP is elevated. Difficult to tell she has a significant edema but there is some of the right leg and left leg  is amputated. She can't weigh her self adequately to keep track of her daily weights, but I think we need to increase her diuretics. Also recommended rechecking a metabolic profile and BNP. She may need to have a repeat echocardiogram to see if her heart is again weak. I will plan to see her back next week to review the labs and see if she's its progress diuresing.  Chrystie Nose, MD, [facility] Attending Cardiologist [facility]  Janeliz Prestwood C 11/03/2012, 2:26 PM

## 2012-11-05 ENCOUNTER — Encounter (HOSPITAL_COMMUNITY): Payer: Self-pay | Admitting: Internal Medicine

## 2012-11-05 ENCOUNTER — Telehealth (HOSPITAL_COMMUNITY): Payer: Self-pay | Admitting: Internal Medicine

## 2012-11-05 NOTE — Telephone Encounter (Signed)
Before scheduling patient, I needed confirmation from the physician if testing was needed. Per Dr. Rennis Golden patient does not need test right now.

## 2012-11-05 NOTE — Telephone Encounter (Signed)
Message copied by Shauna Hugh on Mon Nov 05, 2012  3:38 PM ------      Message from: Lindell Spar      Created: Mon Nov 05, 2012 10:40 AM       Per Dr. Rennis Golden, does not need right now. Thanks ------

## 2012-11-06 ENCOUNTER — Encounter: Payer: Self-pay | Admitting: Internal Medicine

## 2012-11-08 ENCOUNTER — Ambulatory Visit: Payer: Medicare Other | Admitting: Internal Medicine

## 2012-12-26 ENCOUNTER — Telehealth: Payer: Self-pay | Admitting: Cardiovascular Disease

## 2012-12-26 NOTE — Telephone Encounter (Signed)
Need refill on Levothyroxine 125 mcg #30.Last refill 01-2011

## 2013-01-01 ENCOUNTER — Encounter: Payer: Self-pay | Admitting: Internal Medicine

## 2013-01-01 ENCOUNTER — Ambulatory Visit (INDEPENDENT_AMBULATORY_CARE_PROVIDER_SITE_OTHER): Payer: Medicare Other | Admitting: Internal Medicine

## 2013-01-01 VITALS — BP 128/58 | HR 64 | Ht <= 58 in | Wt 107.4 lb

## 2013-01-01 DIAGNOSIS — N186 End stage renal disease: Secondary | ICD-10-CM

## 2013-01-01 DIAGNOSIS — I5023 Acute on chronic systolic (congestive) heart failure: Secondary | ICD-10-CM

## 2013-01-01 DIAGNOSIS — I43 Cardiomyopathy in diseases classified elsewhere: Secondary | ICD-10-CM

## 2013-01-01 DIAGNOSIS — I2510 Atherosclerotic heart disease of native coronary artery without angina pectoris: Secondary | ICD-10-CM

## 2013-01-01 DIAGNOSIS — I739 Peripheral vascular disease, unspecified: Secondary | ICD-10-CM

## 2013-01-01 DIAGNOSIS — E039 Hypothyroidism, unspecified: Secondary | ICD-10-CM

## 2013-01-01 LAB — BASIC METABOLIC PANEL
BUN: 57 mg/dL — ABNORMAL HIGH (ref 6–23)
CO2: 24 mEq/L (ref 19–32)
Calcium: 9.1 mg/dL (ref 8.4–10.5)
Chloride: 103 mEq/L (ref 96–112)
Creat: 2.7 mg/dL — ABNORMAL HIGH (ref 0.50–1.10)
Glucose, Bld: 132 mg/dL — ABNORMAL HIGH (ref 70–99)

## 2013-01-01 NOTE — Patient Instructions (Addendum)
Your physician has requested that you have an echocardiogram. Echocardiography is a painless test that uses sound waves to create images of your heart. It provides your doctor with information about the size and shape of your heart and how well your heart's chambers and valves are working. This procedure takes approximately one hour. There are no restrictions for this procedure.  Your physician recommends that you schedule a follow-up appointment in: 3 months.  Please have lab work done today.

## 2013-01-01 NOTE — Progress Notes (Signed)
OFFICE NOTE  Chief Complaint:  Routine followup  Primary Care Physician: Georgianne Fick, MD  HPI:  KODEE DRURY is a pleasant 79 year old female with an unfortunate history of heart failure in the past due to severe hypothyroidism. EF has subsequently normalized. However, she does have significant peripheral arterial disease; underwent a left BKA. When I saw her last on December 13, she was having problems with rest pain in her right foot, and discoloration of the sole of her foot and toes, with tenderness to palpation. I had prescribed her for pain medicine and had reviewed her lower extremity arteriogram with Dr. Allyson Sabal, and felt that there were very few options for revascularization. She returned today and has reported some improvement with reduction in pain, and there does appear to be less discoloration. However, pulses still remain very weak. Her family reports she's also had a mild cough and does seem a little bit more fatigued. She did not report any worsening shortness of breath or orthopnea but has had some difficulty transferring. At her last office visit, her JVP is elevated and there was some trace right lower extremity edema. She did have basilar crackles and appeared to be volume overloaded. I recommended increasing her diuretics and ordered a BMP and BNP. She was to return to the office in 2 weeks for reassessment. Unfortunately she never made that appointment and I am now seeing her 2 months later. She also did not get her laboratory work performed. She has been taking the increased dose of diuretics and reports that she is doing fairly well. Her weight at the last visit was estimated at 120 pounds based on her home scales, and she was actually measured today with a weight of 107. She denies any worsening shortness of breath.  PMHx:  Past Medical History  Diagnosis Date  . Coronary artery disease   . TIA (transient ischemic attack)   . Hypertension   . Shingles   . Stroke    . Anemia   . CAD (coronary artery disease) 02/08/2011  . Cardiomyopathy, idiopathic 02/08/2011  . Chronic kidney disease   . Angina     Takes Isosorbide  . Unstable angina 02/08/2011  . Myocardial infarct     x 3 unsure of years  . Irregular heartbeat   . Ulcer   . GERD (gastroesophageal reflux disease)     takes Protonix and zantac  . Hx of transient ischemic attack (TIA)   . Memory loss   . CHF (congestive heart failure)     Takes Lasix  . Gout     takes allopurinol  . Peripheral vascular disease     left lower  leg  . Diabetes mellitus     type 2 NIDDM x 2 years; no meds  . Chronic kidney disease (CKD), stage IV (severe)   . Arthritis   . Shortness of breath   . Thyroid disease   . Hypothyroidism     (SEVERE) Takes Levothryroxine  . Hypothyroid 02/08/2011  . Nonischemic cardiomyopathy     EF now is 55%, reduced due to myxedema, which is improved.  . Dyslipidemia   . Peripheral neuropathy   . H/O echocardiogram 09/06/11    Indication- nonIschemic Cardiomyopathy. EF = now greater than 55% with no regional wall motion abnormalities. Tthere is mild to moderate trisuspid regurgitayion and mild pulmonary hypertension with an RVSP of 35 mmHg as well as stage 1 diastolic dysfunction and mild to moderate LVH.  Marland Kitchen Abnormal nuclear stress test 06/01/09  Demonstrated a new area of infarct scar, peri-infarct ischemia seen in the inferolateral territory. EF eas normal at 70% with mild hypocontractility at the apex, distal inferolateral wall.    Past Surgical History  Procedure Laterality Date  . Back surgery      [facility]  . Eye surgery      Left eye surgery; cataract removal  . Left bka  07/05/2011  . Av fistula placement    . Amputation  07/05/2011    Procedure: AMPUTATION BELOW KNEE;  Surgeon: Chuck Hint, MD;  Location: [facility] OR;  Service: Vascular;  Laterality: Left;  . Angioplasty  1988  . Cardiac catheterization  09/28/07    Demonstrated multiple  sequential lesions around 40 to 30% in the RCA territory.  . Left lower extremity venous duplex Left 06/27/11    Summary: No evidence of DVT involving the left lower extremity and right common femoral vein.   . Lower extremity arterial evaluation  06/27/11    SUMMARY: Right: ABI not ascertained due to false elevation in BP secondary to calcification (posterior tibial artery is non compressible). Left: ABI indicates moderate reduction in arterial flow. Bilateral: Great toe PPG waveforms indicate adequate perfusion. Great toe pressures not obtained due to patient's movements secondary to pain.  . Duplex doppler  05/10/11    LE arterial dopplers demonstrate bilaterally reduced ABIs of 0.91 on right & 0.56 on left. She does report some decreased pain on the left, & there's moderate mixed-density plaque in the right SFA w/50 to 69% reduction. There's a 69% reduction in the left SFA & does appear to be occlusive disease of left posterior tibial artery. Right posterior dorsalis pedis artery demonstrates occlusive disease    FAMHx:  Family History  Problem Relation Age of Onset  . Cancer Sister     STOMACH  . Diabetes Sister   . Cancer Brother     BONE  . Diabetes Brother   . Anesthesia problems Neg Hx   . Hypotension Neg Hx   . Malignant hyperthermia Neg Hx   . Pseudochol deficiency Neg Hx   . Hyperlipidemia Daughter   . Hypertension Daughter   . Heart disease Daughter     before age 62  . Kidney disease Daughter   . Heart attack Daughter   . Other Daughter     varicose veins  . Diabetes Daughter   . Heart disease Son     before age 2  . Hyperlipidemia Son   . Hypertension Son   . Heart attack Son     SOCHx:   reports that she quit smoking about 26 years ago. She has never used smokeless tobacco. She reports that she does not drink alcohol or use illicit drugs.  ALLERGIES:  Allergies  Allergen Reactions  . Aspirin Nausea And Vomiting    325 mg (adult strength) Patient stated that  she can take the coated aspirin with no problems.     ROS: A comprehensive review of systems was negative except for: Respiratory: positive for dyspnea on exertion  HOME MEDS: Current Outpatient Prescriptions  Medication Sig Dispense Refill  . acetaminophen (TYLENOL) 500 MG tablet Take 500 mg by mouth every 6 (six) hours as needed for pain.      Marland Kitchen allopurinol (ZYLOPRIM) 100 MG tablet Take 100 mg by mouth daily.       Marland Kitchen atorvastatin (LIPITOR) 40 MG tablet Take 40 mg by mouth daily.        . carvedilol (COREG)  12.5 MG tablet Take 1 tablet (12.5 mg total) by mouth 2 (two) times daily with a meal.  30 tablet  0  . clopidogrel (PLAVIX) 75 MG tablet Take 75 mg by mouth daily.      . furosemide (LASIX) 40 MG tablet Take 80 mg by mouth 2 (two) times daily.       Marland Kitchen gabapentin (NEURONTIN) 300 MG capsule Take 300 mg by mouth 2 (two) times daily.      . isosorbide mononitrate (IMDUR) 60 MG 24 hr tablet Take 60 mg by mouth daily.       Marland Kitchen levothyroxine (SYNTHROID, LEVOTHROID) 75 MCG tablet Take 75 mcg by mouth daily before breakfast.      . nitroGLYCERIN (NITROSTAT) 0.4 MG SL tablet Place 0.4 mg under the tongue every 5 (five) minutes as needed. For chest pain      . ranitidine (ZANTAC) 150 MG tablet Take 150 mg by mouth daily as needed. For heart burn.      . Saxagliptin HCl (ONGLYZA PO) Take 2.5 mg by mouth daily.        No current facility-administered medications for this visit.    LABS/IMAGING: No results found for this or any previous visit (from the past 48 hour(s)). No results found.  VITALS: BP 128/58  Pulse 64  Ht 4\' 9"  (1.448 m)  Wt 107 lb 6.4 oz (48.716 kg)  BMI 23.23 kg/m2  EXAM: General appearance: alert and no distress Neck: JVD flat, no carotid bruit Lungs: diminished breath sounds bilaterally and rales bibasilar Heart: regular rate and rhythm Abdomen: soft, mildly protuberant Extremities: no edema on the right leg, left is BKA Pulses: faint pulse in the right foot Skin:  Skin color, texture, turgor normal. No rashes or lesions Neurologic: Mental status: Alert, oriented, thought content appropriate  EKG: deferred  ASSESSMENT: 1. Acute on chronic systolic heart failure exacerbation - improved 2. PAD with left BKA 3. History of severe hypothyroidism with cardiomyopathy, EF improved  PLAN: 1.   Mrs. Goens appears to improved somewhat with the increased dose of Lasix. Unfortunately she did not get the lab work done as I requested. I explained to her how dangerous this could be with regards to worsening kidney function. I recommended that she get lab work today including a BNP and BMP. I also like to repeat her echocardiogram to see this been any change or worsening in her ejection fraction explained the heart failure exacerbation.  Will plan to see her back in 3 months for close followup.  Chrystie Nose, MD, [facility] Attending Cardiologist [facility]  Haisley Arens C 01/01/2013, 9:42 AM

## 2013-01-03 ENCOUNTER — Telehealth: Payer: Self-pay | Admitting: *Deleted

## 2013-01-03 DIAGNOSIS — Z79899 Other long term (current) drug therapy: Secondary | ICD-10-CM

## 2013-01-03 NOTE — Telephone Encounter (Signed)
Message copied by Lindell Spar on Thu Jan 03, 2013  4:41 PM ------      Message from: Chrystie Nose      Created: Thu Jan 03, 2013  4:31 PM       Please have her go back to lasix 80 mg once daily.  Bloodwork indicates her kidney function is worse. She will need to have repeat BMP in 1 week.            Dr. Rennis Golden ------

## 2013-01-03 NOTE — Telephone Encounter (Signed)
Called patient with lab results & instructions per Dr. Rennis Golden to decreased furosemide to 80mg  once daily. Informed of need for repeat blood work. Informed patient labs will be ordered and lab slip will be mailed to patient.

## 2013-01-04 ENCOUNTER — Emergency Department (HOSPITAL_COMMUNITY): Payer: Medicare Other

## 2013-01-04 ENCOUNTER — Inpatient Hospital Stay (HOSPITAL_COMMUNITY)
Admission: EM | Admit: 2013-01-04 | Discharge: 2013-01-16 | DRG: 291 | Disposition: A | Discharge status: Skilled Nursing Facility | Payer: Medicare Other | Attending: Internal Medicine | Admitting: Internal Medicine

## 2013-01-04 DIAGNOSIS — S88119A Complete traumatic amputation at level between knee and ankle, unspecified lower leg, initial encounter: Secondary | ICD-10-CM

## 2013-01-04 DIAGNOSIS — G609 Hereditary and idiopathic neuropathy, unspecified: Secondary | ICD-10-CM | POA: Diagnosis present

## 2013-01-04 DIAGNOSIS — E038 Other specified hypothyroidism: Secondary | ICD-10-CM | POA: Diagnosis present

## 2013-01-04 DIAGNOSIS — I252 Old myocardial infarction: Secondary | ICD-10-CM

## 2013-01-04 DIAGNOSIS — D649 Anemia, unspecified: Secondary | ICD-10-CM | POA: Diagnosis present

## 2013-01-04 DIAGNOSIS — Z6825 Body mass index (BMI) 25.0-25.9, adult: Secondary | ICD-10-CM

## 2013-01-04 DIAGNOSIS — R001 Bradycardia, unspecified: Secondary | ICD-10-CM | POA: Diagnosis present

## 2013-01-04 DIAGNOSIS — I5033 Acute on chronic diastolic (congestive) heart failure: Secondary | ICD-10-CM | POA: Diagnosis present

## 2013-01-04 DIAGNOSIS — I120 Hypertensive chronic kidney disease with stage 5 chronic kidney disease or end stage renal disease: Secondary | ICD-10-CM

## 2013-01-04 DIAGNOSIS — I798 Other disorders of arteries, arterioles and capillaries in diseases classified elsewhere: Secondary | ICD-10-CM | POA: Diagnosis present

## 2013-01-04 DIAGNOSIS — E872 Acidosis, unspecified: Secondary | ICD-10-CM | POA: Diagnosis present

## 2013-01-04 DIAGNOSIS — IMO0001 Reserved for inherently not codable concepts without codable children: Principal | ICD-10-CM | POA: Diagnosis present

## 2013-01-04 DIAGNOSIS — I5189 Other ill-defined heart diseases: Secondary | ICD-10-CM | POA: Diagnosis present

## 2013-01-04 DIAGNOSIS — Z8673 Personal history of transient ischemic attack (TIA), and cerebral infarction without residual deficits: Secondary | ICD-10-CM

## 2013-01-04 DIAGNOSIS — R0602 Shortness of breath: Secondary | ICD-10-CM

## 2013-01-04 DIAGNOSIS — Z66 Do not resuscitate: Secondary | ICD-10-CM | POA: Diagnosis not present

## 2013-01-04 DIAGNOSIS — E1151 Type 2 diabetes mellitus with diabetic peripheral angiopathy without gangrene: Secondary | ICD-10-CM | POA: Diagnosis present

## 2013-01-04 DIAGNOSIS — E875 Hyperkalemia: Secondary | ICD-10-CM | POA: Diagnosis present

## 2013-01-04 DIAGNOSIS — I129 Hypertensive chronic kidney disease with stage 1 through stage 4 chronic kidney disease, or unspecified chronic kidney disease: Secondary | ICD-10-CM

## 2013-01-04 DIAGNOSIS — N184 Chronic kidney disease, stage 4 (severe): Secondary | ICD-10-CM | POA: Diagnosis present

## 2013-01-04 DIAGNOSIS — I428 Other cardiomyopathies: Secondary | ICD-10-CM | POA: Diagnosis present

## 2013-01-04 DIAGNOSIS — I509 Heart failure, unspecified: Secondary | ICD-10-CM | POA: Diagnosis present

## 2013-01-04 DIAGNOSIS — K5900 Constipation, unspecified: Secondary | ICD-10-CM | POA: Diagnosis not present

## 2013-01-04 DIAGNOSIS — I2510 Atherosclerotic heart disease of native coronary artery without angina pectoris: Secondary | ICD-10-CM | POA: Diagnosis present

## 2013-01-04 DIAGNOSIS — Z7902 Long term (current) use of antithrombotics/antiplatelets: Secondary | ICD-10-CM

## 2013-01-04 DIAGNOSIS — I498 Other specified cardiac arrhythmias: Secondary | ICD-10-CM | POA: Diagnosis present

## 2013-01-04 DIAGNOSIS — Z79899 Other long term (current) drug therapy: Secondary | ICD-10-CM

## 2013-01-04 DIAGNOSIS — E785 Hyperlipidemia, unspecified: Secondary | ICD-10-CM | POA: Diagnosis present

## 2013-01-04 DIAGNOSIS — I739 Peripheral vascular disease, unspecified: Secondary | ICD-10-CM | POA: Diagnosis present

## 2013-01-04 DIAGNOSIS — I959 Hypotension, unspecified: Secondary | ICD-10-CM | POA: Diagnosis present

## 2013-01-04 DIAGNOSIS — E876 Hypokalemia: Secondary | ICD-10-CM | POA: Diagnosis not present

## 2013-01-04 DIAGNOSIS — Z87891 Personal history of nicotine dependence: Secondary | ICD-10-CM

## 2013-01-04 DIAGNOSIS — Z7982 Long term (current) use of aspirin: Secondary | ICD-10-CM

## 2013-01-04 DIAGNOSIS — E1159 Type 2 diabetes mellitus with other circulatory complications: Secondary | ICD-10-CM | POA: Diagnosis present

## 2013-01-04 DIAGNOSIS — N186 End stage renal disease: Secondary | ICD-10-CM | POA: Diagnosis present

## 2013-01-04 DIAGNOSIS — R627 Adult failure to thrive: Secondary | ICD-10-CM | POA: Diagnosis present

## 2013-01-04 DIAGNOSIS — E039 Hypothyroidism, unspecified: Secondary | ICD-10-CM | POA: Diagnosis present

## 2013-01-04 DIAGNOSIS — K219 Gastro-esophageal reflux disease without esophagitis: Secondary | ICD-10-CM | POA: Diagnosis present

## 2013-01-04 DIAGNOSIS — J9600 Acute respiratory failure, unspecified whether with hypoxia or hypercapnia: Secondary | ICD-10-CM | POA: Diagnosis present

## 2013-01-04 DIAGNOSIS — M109 Gout, unspecified: Secondary | ICD-10-CM | POA: Diagnosis present

## 2013-01-04 DIAGNOSIS — N179 Acute kidney failure, unspecified: Secondary | ICD-10-CM | POA: Diagnosis present

## 2013-01-04 LAB — COMPREHENSIVE METABOLIC PANEL
AST: 25 U/L (ref 0–37)
BUN: 74 mg/dL — ABNORMAL HIGH (ref 6–23)
CO2: 20 mEq/L (ref 19–32)
Calcium: 8.3 mg/dL — ABNORMAL LOW (ref 8.4–10.5)
Chloride: 102 mEq/L (ref 96–112)
Creatinine, Ser: 4.18 mg/dL — ABNORMAL HIGH (ref 0.50–1.10)
GFR calc Af Amer: 10 mL/min — ABNORMAL LOW (ref >90)
GFR calc non Af Amer: 9 mL/min — ABNORMAL LOW (ref >90)
Glucose, Bld: 192 mg/dL — ABNORMAL HIGH (ref 70–99)
Total Bilirubin: 0.2 mg/dL — ABNORMAL LOW (ref 0.3–1.2)

## 2013-01-04 MED ORDER — IPRATROPIUM BROMIDE 0.02 % IN SOLN
0.5000 mg | Freq: Once | RESPIRATORY_TRACT | Status: AC
  Administered 2013-01-04: 0.5 mg via RESPIRATORY_TRACT
  Filled 2013-01-04: qty 2.5

## 2013-01-04 MED ORDER — ALBUTEROL (5 MG/ML) CONTINUOUS INHALATION SOLN
10.0000 mg/h | INHALATION_SOLUTION | RESPIRATORY_TRACT | Status: DC
  Administered 2013-01-04: 10 mg/h via RESPIRATORY_TRACT
  Filled 2013-01-04: qty 20

## 2013-01-04 MED ORDER — METHYLPREDNISOLONE SODIUM SUCC 125 MG IJ SOLR
125.0000 mg | Freq: Once | INTRAMUSCULAR | Status: AC
  Administered 2013-01-04: 125 mg via INTRAVENOUS
  Filled 2013-01-04: qty 2

## 2013-01-04 NOTE — ED Notes (Signed)
Patient comes from home with complain of respiratory difficulty starting 2-3 days ago. O2 sats at home were 70% on RA. Received 10 albuterol and 0.5 atrovent en route via EMS.

## 2013-01-04 NOTE — ED Provider Notes (Signed)
CSN: [id number]     Arrival date & time 01/04/13  2232 History   First MD Initiated Contact with Patient 01/04/13 2258     Chief Complaint  Patient presents with  . Respiratory Distress   (Consider location/radiation/quality/duration/timing/severity/associated sxs/prior Treatment) The history is provided by the patient and a relative.   79 year old female comes in complaining of difficulty breathing since noon today. Nothing makes it better nothing makes it worse. She has had a cough which is nonproductive. She denies chest pain, heaviness, tightness, pressure. Denies nausea, vomiting, diarrhea. Denies fever, chills, sweats. She is treated for her congestive heart failure and states that her diuretic dose was decreased from 2 pills twice a day to one pill twice a day and this was done 3 days ago. She's not noticed any change in her long-standing peripheral edema. When EMS arrived, she was noted to be wheezing and she was given albuterol with ipratropium. Initial oxygen saturation was 70%.  Past Medical History  Diagnosis Date  . Coronary artery disease   . TIA (transient ischemic attack)   . Hypertension   . Shingles   . Stroke   . Anemia   . CAD (coronary artery disease) 02/08/2011  . Cardiomyopathy, idiopathic 02/08/2011  . Chronic kidney disease   . Angina     Takes Isosorbide  . Unstable angina 02/08/2011  . Myocardial infarct     x 3 unsure of years  . Irregular heartbeat   . Ulcer   . GERD (gastroesophageal reflux disease)     takes Protonix and zantac  . Hx of transient ischemic attack (TIA)   . Memory loss   . CHF (congestive heart failure)     Takes Lasix  . Gout     takes allopurinol  . Peripheral vascular disease     left lower  leg  . Diabetes mellitus     type 2 NIDDM x 2 years; no meds  . Chronic kidney disease (CKD), stage IV (severe)   . Arthritis   . Shortness of breath   . Thyroid disease   . Hypothyroidism     (SEVERE) Takes Levothryroxine  .  Hypothyroid 02/08/2011  . Nonischemic cardiomyopathy     EF now is 55%, reduced due to myxedema, which is improved.  . Dyslipidemia   . Peripheral neuropathy   . H/O echocardiogram 09/06/11    Indication- nonIschemic Cardiomyopathy. EF = now greater than 55% with no regional wall motion abnormalities. Tthere is mild to moderate trisuspid regurgitayion and mild pulmonary hypertension with an RVSP of 35 mmHg as well as stage 1 diastolic dysfunction and mild to moderate LVH.  Marland Kitchen Abnormal nuclear stress test 06/01/09    Demonstrated a new area of infarct scar, peri-infarct ischemia seen in the inferolateral territory. EF eas normal at 70% with mild hypocontractility at the apex, distal inferolateral wall.   Past Surgical History  Procedure Laterality Date  . Back surgery      [facility]  . Eye surgery      Left eye surgery; cataract removal  . Left bka  07/05/2011  . Av fistula placement    . Amputation  07/05/2011    Procedure: AMPUTATION BELOW KNEE;  Surgeon: Chuck Hint, MD;  Location: [facility] OR;  Service: Vascular;  Laterality: Left;  . Angioplasty  1988  . Cardiac catheterization  09/28/07    Demonstrated multiple sequential lesions around 40 to 30% in the RCA territory.  . Left lower extremity venous duplex Left  06/27/11    Summary: No evidence of DVT involving the left lower extremity and right common femoral vein.   . Lower extremity arterial evaluation  06/27/11    SUMMARY: Right: ABI not ascertained due to false elevation in BP secondary to calcification (posterior tibial artery is non compressible). Left: ABI indicates moderate reduction in arterial flow. Bilateral: Great toe PPG waveforms indicate adequate perfusion. Great toe pressures not obtained due to patient's movements secondary to pain.  . Duplex doppler  05/10/11    LE arterial dopplers demonstrate bilaterally reduced ABIs of 0.91 on right & 0.56 on left. She does report some decreased pain on the left, & there's  moderate mixed-density plaque in the right SFA w/50 to 69% reduction. There's a 69% reduction in the left SFA & does appear to be occlusive disease of left posterior tibial artery. Right posterior dorsalis pedis artery demonstrates occlusive disease   Family History  Problem Relation Age of Onset  . Cancer Sister     STOMACH  . Diabetes Sister   . Cancer Brother     BONE  . Diabetes Brother   . Anesthesia problems Neg Hx   . Hypotension Neg Hx   . Malignant hyperthermia Neg Hx   . Pseudochol deficiency Neg Hx   . Hyperlipidemia Daughter   . Hypertension Daughter   . Heart disease Daughter     before age 14  . Kidney disease Daughter   . Heart attack Daughter   . Other Daughter     varicose veins  . Diabetes Daughter   . Heart disease Son     before age 25  . Hyperlipidemia Son   . Hypertension Son   . Heart attack Son    History  Substance Use Topics  . Smoking status: Former Smoker -- 0.50 packs/day for 40 years    Quit date: 07/05/1986  . Smokeless tobacco: Never Used     Comment: quit smoking 1988  . Alcohol Use: No   OB History   Grav Para Term Preterm Abortions TAB SAB Ect Mult Living                 Review of Systems  All other systems reviewed and are negative.    Allergies  Aspirin  Home Medications   Current Outpatient Rx  Name  Route  Sig  Dispense  Refill  . allopurinol (ZYLOPRIM) 100 MG tablet   Oral   Take 100 mg by mouth daily.          Marland Kitchen amLODipine (NORVASC) 10 MG tablet   Oral   Take 10 mg by mouth daily.         Marland Kitchen aspirin 81 MG tablet   Oral   Take 81 mg by mouth daily.         . carvedilol (COREG) 12.5 MG tablet   Oral   Take 1 tablet (12.5 mg total) by mouth 2 (two) times daily with a meal.   30 tablet   0   . clopidogrel (PLAVIX) 75 MG tablet   Oral   Take 75 mg by mouth daily.         . furosemide (LASIX) 40 MG tablet   Oral   Take 40 mg by mouth daily.          Marland Kitchen gabapentin (NEURONTIN) 300 MG capsule    Oral   Take 300 mg by mouth 2 (two) times daily.         Marland Kitchen guanFACINE (  INTUNIV) 2 MG TB24 SR tablet   Oral   Take 2 mg by mouth daily.         . isosorbide mononitrate (IMDUR) 60 MG 24 hr tablet   Oral   Take 60 mg by mouth daily.          Marland Kitchen levothyroxine (SYNTHROID, LEVOTHROID) 75 MCG tablet   Oral   Take 75 mcg by mouth daily before breakfast.         . nitroGLYCERIN (NITROSTAT) 0.4 MG SL tablet   Sublingual   Place 0.4 mg under the tongue every 5 (five) minutes as needed for chest pain.          . pravastatin (PRAVACHOL) 40 MG tablet   Oral   Take 40 mg by mouth daily.         . saxagliptin HCl (ONGLYZA) 5 MG TABS tablet   Oral   Take 5 mg by mouth daily.         . travoprost, benzalkonium, (TRAVATAN) 0.004 % ophthalmic solution   Both Eyes   Place into both eyes at bedtime.          BP 128/73  Pulse 49  Temp(Src) 98 F (36.7 C) (Oral)  Resp 21  SpO2 100% Physical Exam  Nursing note and vitals reviewed.  79 year old female, resting comfortably and in no acute distress. Vital signs are significant for bradycardia with heart rate of 49, and tachypnea with respiratory rate of 21. Oxygen saturation is 100%, which is normal. Head is normocephalic and atraumatic. PERRLA, EOMI. Oropharynx is clear. Neck is nontender and supple without adenopathy or JVD. Back is nontender and there is no CVA tenderness. Lungs have coarse bibasilar rales with diffuse expiratory wheezing. Chest is nontender. Heart has regular rate and rhythm without murmur. Abdomen is soft, flat, nontender without masses or hepatosplenomegaly and peristalsis is normoactive. Extremities: There is a left below-the-knee amputation. 1+ pitting edema noted in the right leg. Skin is warm and dry without rash. Neurologic: Mental status is normal, cranial nerves are intact, there are no motor or sensory deficits.  ED Course  Procedures (including critical care time) Labs Review Results for  orders placed during the hospital encounter of 01/04/13  CBC WITH DIFFERENTIAL      Result Value Range   WBC 8.8  4.0 - 10.5 K/uL   RBC 3.84 (*) 3.87 - 5.11 MIL/uL   Hemoglobin 11.0 (*) 12.0 - 15.0 g/dL   HCT 16.1 (*) 09.6 - 04.5 %   MCV 87.0  78.0 - 100.0 fL   MCH 28.6  26.0 - 34.0 pg   MCHC 32.9  30.0 - 36.0 g/dL   RDW 40.9 (*) 81.1 - 91.4 %   Platelets 350  150 - 400 K/uL   Neutrophils Relative % 65  43 - 77 %   Lymphocytes Relative 21  12 - 46 %   Monocytes Relative 8  3 - 12 %   Eosinophils Relative 6 (*) 0 - 5 %   Basophils Relative 0  0 - 1 %   Neutro Abs 5.8  1.7 - 7.7 K/uL   Lymphs Abs 1.8  0.7 - 4.0 K/uL   Monocytes Absolute 0.7  0.1 - 1.0 K/uL   Eosinophils Absolute 0.5  0.0 - 0.7 K/uL   Basophils Absolute 0.0  0.0 - 0.1 K/uL   RBC Morphology POLYCHROMASIA PRESENT    COMPREHENSIVE METABOLIC PANEL      Result Value Range   Sodium  137  135 - 145 mEq/L   Potassium 5.3 (*) 3.5 - 5.1 mEq/L   Chloride 102  96 - 112 mEq/L   CO2 20  19 - 32 mEq/L   Glucose, Bld 192 (*) 70 - 99 mg/dL   BUN 74 (*) 6 - 23 mg/dL   Creatinine, Ser 4.09 (*) 0.50 - 1.10 mg/dL   Calcium 8.3 (*) 8.4 - 10.5 mg/dL   Total Protein 6.4  6.0 - 8.3 g/dL   Albumin 3.4 (*) 3.5 - 5.2 g/dL   AST 25  0 - 37 U/L   ALT 18  0 - 35 U/L   Alkaline Phosphatase 99  39 - 117 U/L   Total Bilirubin 0.2 (*) 0.3 - 1.2 mg/dL   GFR calc non Af Amer 9 (*) >90 mL/min   GFR calc Af Amer 10 (*) >90 mL/min  PRO B NATRIURETIC PEPTIDE      Result Value Range   Pro B Natriuretic peptide (BNP) 16572.0 (*) 0 - 450 pg/mL  TROPONIN I      Result Value Range   Troponin I <0.30  <0.30 ng/mL   Imaging Review Dg Chest Port 1 View  01/05/2013   CLINICAL DATA:  Shortness of breath  EXAM: PORTABLE CHEST - 1 VIEW  COMPARISON:  02/06/2012  FINDINGS: Cardiopericardial enlargement. Rightward deviation of the trachea at the level of the aortic knob, accentuated from priors by portable technique. A similar appearance seen 10/07/2010.  Remaining upper mediastinal contours stable.  Indistinct, streaky opacities in the lower lungs, left more than right. No overt edema. Possible small effusions. No pneumothorax.  IMPRESSION: Lower lung interstitial opacities which may represent atelectasis, pneumonitis, or infection.   Electronically Signed   By: Tiburcio Pea M.D.   On: 01/05/2013 00:13   Images viewed by me.   Date: 01/05/2013  Rate: 48  Rhythm: Junctional bradycardia  QRS Axis: normal  Intervals: normal  ST/T Wave abnormalities: nonspecific T wave changes  Conduction Disutrbances:nonspecific intraventricular conduction delay  Narrative Interpretation: Junctional bradycardia, nonspecific T wave changes, poor R-wave progression, nonspecific intraventricular conduction delay. When compared with ECG of 02/06/2012, junctional bradycardia has replaced sinus rhythm.  Old EKG Reviewed: changes noted   MDM   1. CHF exacerbation   2. Acute on chronic renal failure   3. Hyperkalemia   4. Anemia    Dyspnea with rales and wheezes. Patient has a known history of congestive heart failure and had a recent reduction in her diuretic dose so this may be an exacerbation of CHF. Wheezing could be from CHF or bronchospasm. BNP will need to be checked as well as chest x-ray. Old records are reviewed and she has a history of congestive heart failure from hypothyroidism but with a normal current ejection fraction. She has chronic kidney disease.  Chest x-ray shows increased cardiac silhouette and bibasilar fluid consistent with CHF exacerbation. Creatinine has increased significantly in just 3 days are. It is noted that creatinine had gone up over baseline limited been checked 3 days ago and this is probably the reason that she had her diuretic dose decreased. She was given a dose of furosemide with no diaphoresis. She continues to have mild to moderate wheezing on lung exam. She will need to be admitted for treatment of her fluid overloaded  status and to try and correct her renal function. Case is discussed with Dr. Lovell Sheehan of [facility] who agrees to admit the patient.   Dione Booze, MD 01/05/13 [phone]

## 2013-01-04 NOTE — ED Notes (Signed)
MD Yelverton at bedside. 

## 2013-01-05 ENCOUNTER — Encounter (HOSPITAL_COMMUNITY): Payer: Self-pay | Admitting: General Practice

## 2013-01-05 DIAGNOSIS — I43 Cardiomyopathy in diseases classified elsewhere: Secondary | ICD-10-CM

## 2013-01-05 DIAGNOSIS — I059 Rheumatic mitral valve disease, unspecified: Secondary | ICD-10-CM

## 2013-01-05 DIAGNOSIS — I5033 Acute on chronic diastolic (congestive) heart failure: Secondary | ICD-10-CM

## 2013-01-05 DIAGNOSIS — J9600 Acute respiratory failure, unspecified whether with hypoxia or hypercapnia: Secondary | ICD-10-CM | POA: Diagnosis present

## 2013-01-05 DIAGNOSIS — I739 Peripheral vascular disease, unspecified: Secondary | ICD-10-CM

## 2013-01-05 DIAGNOSIS — N184 Chronic kidney disease, stage 4 (severe): Secondary | ICD-10-CM | POA: Diagnosis present

## 2013-01-05 DIAGNOSIS — I2510 Atherosclerotic heart disease of native coronary artery without angina pectoris: Secondary | ICD-10-CM

## 2013-01-05 DIAGNOSIS — E875 Hyperkalemia: Secondary | ICD-10-CM | POA: Diagnosis present

## 2013-01-05 DIAGNOSIS — I5189 Other ill-defined heart diseases: Secondary | ICD-10-CM | POA: Diagnosis present

## 2013-01-05 DIAGNOSIS — D649 Anemia, unspecified: Secondary | ICD-10-CM

## 2013-01-05 DIAGNOSIS — I509 Heart failure, unspecified: Secondary | ICD-10-CM

## 2013-01-05 DIAGNOSIS — R0602 Shortness of breath: Secondary | ICD-10-CM

## 2013-01-05 DIAGNOSIS — N179 Acute kidney failure, unspecified: Secondary | ICD-10-CM

## 2013-01-05 DIAGNOSIS — E785 Hyperlipidemia, unspecified: Secondary | ICD-10-CM | POA: Diagnosis present

## 2013-01-05 DIAGNOSIS — N183 Chronic kidney disease, stage 3 unspecified: Secondary | ICD-10-CM

## 2013-01-05 DIAGNOSIS — E039 Hypothyroidism, unspecified: Secondary | ICD-10-CM

## 2013-01-05 DIAGNOSIS — R001 Bradycardia, unspecified: Secondary | ICD-10-CM | POA: Diagnosis present

## 2013-01-05 DIAGNOSIS — N189 Chronic kidney disease, unspecified: Secondary | ICD-10-CM

## 2013-01-05 DIAGNOSIS — E1151 Type 2 diabetes mellitus with diabetic peripheral angiopathy without gangrene: Secondary | ICD-10-CM | POA: Diagnosis present

## 2013-01-05 LAB — IRON AND TIBC
Saturation Ratios: 17 % — ABNORMAL LOW (ref 20–55)
TIBC: 146 ug/dL — ABNORMAL LOW (ref 250–470)

## 2013-01-05 LAB — TROPONIN I
Troponin I: <0.3 ng/mL (ref <0.30)
Troponin I: <0.3 ng/mL (ref <0.30)
Troponin I: <0.3 ng/mL (ref <0.30)
Troponin I: <0.3 ng/mL (ref <0.30)

## 2013-01-05 LAB — URINALYSIS, ROUTINE W REFLEX MICROSCOPIC
Nitrite: NEGATIVE
Protein, ur: NEGATIVE mg/dL
Specific Gravity, Urine: 1.014 (ref 1.005–1.030)
Urobilinogen, UA: 0.2 mg/dL (ref 0.0–1.0)

## 2013-01-05 LAB — CBC WITH DIFFERENTIAL/PLATELET
Basophils Relative: 0 % (ref 0–1)
Eosinophils Relative: 6 % — ABNORMAL HIGH (ref 0–5)
HCT: 33.4 % — ABNORMAL LOW (ref 36.0–46.0)
Hemoglobin: 11 g/dL — ABNORMAL LOW (ref 12.0–15.0)
Lymphs Abs: 1.8 10*3/uL (ref 0.7–4.0)
MCH: 28.6 pg (ref 26.0–34.0)
MCHC: 32.9 g/dL (ref 30.0–36.0)
MCV: 87 fL (ref 78.0–100.0)
Monocytes Absolute: 0.7 10*3/uL (ref 0.1–1.0)
Neutro Abs: 5.8 10*3/uL (ref 1.7–7.7)
Platelets: 350 10*3/uL (ref 150–400)
RBC: 3.84 MIL/uL — ABNORMAL LOW (ref 3.87–5.11)

## 2013-01-05 LAB — GLUCOSE, CAPILLARY: Glucose-Capillary: 289 mg/dL — ABNORMAL HIGH (ref 70–99)

## 2013-01-05 LAB — HEMOGLOBIN A1C
Hgb A1c MFr Bld: 7.7 % — ABNORMAL HIGH (ref <5.7)
Mean Plasma Glucose: 174 mg/dL — ABNORMAL HIGH (ref <117)

## 2013-01-05 LAB — FERRITIN: Ferritin: 112 ng/mL (ref 10–291)

## 2013-01-05 LAB — PRO B NATRIURETIC PEPTIDE: Pro B Natriuretic peptide (BNP): 16572 pg/mL — ABNORMAL HIGH (ref 0–450)

## 2013-01-05 MED ORDER — CLOPIDOGREL BISULFATE 75 MG PO TABS
75.0000 mg | ORAL_TABLET | Freq: Every day | ORAL | Status: DC
  Administered 2013-01-05 – 2013-01-06 (×2): 75 mg via ORAL
  Filled 2013-01-05 (×4): qty 1

## 2013-01-05 MED ORDER — SIMVASTATIN 20 MG PO TABS
20.0000 mg | ORAL_TABLET | Freq: Every day | ORAL | Status: DC
  Administered 2013-01-05 – 2013-01-16 (×12): 20 mg via ORAL
  Filled 2013-01-05 (×14): qty 1

## 2013-01-05 MED ORDER — PNEUMOCOCCAL VAC POLYVALENT 25 MCG/0.5ML IJ INJ
0.5000 mL | INJECTION | INTRAMUSCULAR | Status: AC
  Filled 2013-01-05: qty 0.5

## 2013-01-05 MED ORDER — GABAPENTIN 300 MG PO CAPS
300.0000 mg | ORAL_CAPSULE | Freq: Two times a day (BID) | ORAL | Status: DC
  Administered 2013-01-05 – 2013-01-07 (×6): 300 mg via ORAL
  Filled 2013-01-05 (×9): qty 1

## 2013-01-05 MED ORDER — FUROSEMIDE 10 MG/ML IJ SOLN
80.0000 mg | Freq: Once | INTRAMUSCULAR | Status: AC
  Administered 2013-01-05: 80 mg via INTRAVENOUS
  Filled 2013-01-05: qty 8

## 2013-01-05 MED ORDER — SODIUM CHLORIDE 0.9 % IJ SOLN
3.0000 mL | Freq: Two times a day (BID) | INTRAMUSCULAR | Status: DC
  Administered 2013-01-05 – 2013-01-16 (×20): 3 mL via INTRAVENOUS

## 2013-01-05 MED ORDER — TRAVOPROST (BAK FREE) 0.004 % OP SOLN
1.0000 [drp] | Freq: Every day | OPHTHALMIC | Status: DC
  Administered 2013-01-05 – 2013-01-15 (×11): 1 [drp] via OPHTHALMIC
  Filled 2013-01-05 (×3): qty 2.5

## 2013-01-05 MED ORDER — ISOSORBIDE MONONITRATE ER 60 MG PO TB24
60.0000 mg | ORAL_TABLET | Freq: Every day | ORAL | Status: DC
  Administered 2013-01-05 – 2013-01-16 (×11): 60 mg via ORAL
  Filled 2013-01-05 (×12): qty 1

## 2013-01-05 MED ORDER — ONDANSETRON HCL 4 MG/2ML IJ SOLN
4.0000 mg | Freq: Four times a day (QID) | INTRAMUSCULAR | Status: DC | PRN
  Administered 2013-01-07 – 2013-01-12 (×2): 4 mg via INTRAVENOUS
  Filled 2013-01-05 (×2): qty 2

## 2013-01-05 MED ORDER — SODIUM CHLORIDE 0.9 % IV SOLN: 250.0000 mL | INTRAVENOUS | Status: DC | PRN

## 2013-01-05 MED ORDER — FUROSEMIDE 10 MG/ML IJ SOLN
160.0000 mg | Freq: Four times a day (QID) | INTRAMUSCULAR | Status: DC
  Administered 2013-01-05 – 2013-01-06 (×4): 160 mg via INTRAVENOUS
  Filled 2013-01-05 (×7): qty 16

## 2013-01-05 MED ORDER — FUROSEMIDE 10 MG/ML IJ SOLN
40.0000 mg | Freq: Once | INTRAMUSCULAR | Status: AC
  Administered 2013-01-05: 40 mg via INTRAVENOUS
  Filled 2013-01-05: qty 4

## 2013-01-05 MED ORDER — INSULIN ASPART 100 UNIT/ML ~~LOC~~ SOLN
0.0000 [IU] | SUBCUTANEOUS | Status: DC
  Administered 2013-01-05 (×3): 5 [IU] via SUBCUTANEOUS
  Administered 2013-01-05: 9 [IU] via SUBCUTANEOUS
  Administered 2013-01-05: 7 [IU] via SUBCUTANEOUS
  Administered 2013-01-06: 5 [IU] via SUBCUTANEOUS
  Administered 2013-01-06: 1 [IU] via SUBCUTANEOUS
  Administered 2013-01-06: 5 [IU] via SUBCUTANEOUS
  Administered 2013-01-06: 7 [IU] via SUBCUTANEOUS
  Administered 2013-01-07 (×2): 2 [IU] via SUBCUTANEOUS
  Administered 2013-01-07: 3 [IU] via SUBCUTANEOUS
  Administered 2013-01-07 (×2): 2 [IU] via SUBCUTANEOUS
  Administered 2013-01-08 (×2): 1 [IU] via SUBCUTANEOUS

## 2013-01-05 MED ORDER — LEVOTHYROXINE SODIUM 75 MCG PO TABS
75.0000 ug | ORAL_TABLET | Freq: Every day | ORAL | Status: DC
  Administered 2013-01-05 – 2013-01-10 (×6): 75 ug via ORAL
  Filled 2013-01-05 (×9): qty 1

## 2013-01-05 MED ORDER — ACETAMINOPHEN 325 MG PO TABS
650.0000 mg | ORAL_TABLET | ORAL | Status: DC | PRN
  Administered 2013-01-05 – 2013-01-15 (×4): 650 mg via ORAL
  Filled 2013-01-05 (×4): qty 2

## 2013-01-05 MED ORDER — NITROGLYCERIN 0.4 MG SL SUBL: 0.4000 mg | SUBLINGUAL_TABLET | SUBLINGUAL | Status: DC | PRN

## 2013-01-05 MED ORDER — SODIUM CHLORIDE 0.9 % IJ SOLN
3.0000 mL | INTRAMUSCULAR | Status: DC | PRN
  Administered 2013-01-09: 22:00:00 3 mL via INTRAVENOUS

## 2013-01-05 MED ORDER — METOLAZONE 2.5 MG PO TABS
2.5000 mg | ORAL_TABLET | Freq: Once | ORAL | Status: AC
  Administered 2013-01-05: 2.5 mg via ORAL
  Filled 2013-01-05: qty 1

## 2013-01-05 MED ORDER — ALLOPURINOL 100 MG PO TABS
100.0000 mg | ORAL_TABLET | Freq: Every day | ORAL | Status: DC
  Administered 2013-01-05 – 2013-01-16 (×11): 100 mg via ORAL
  Filled 2013-01-05 (×12): qty 1

## 2013-01-05 MED ORDER — FUROSEMIDE 10 MG/ML IJ SOLN: 80.0000 mg | Freq: Once | INTRAMUSCULAR | Status: DC

## 2013-01-05 MED ORDER — ENOXAPARIN SODIUM 30 MG/0.3ML ~~LOC~~ SOLN
30.0000 mg | Freq: Every day | SUBCUTANEOUS | Status: DC
  Administered 2013-01-05: 30 mg via SUBCUTANEOUS
  Filled 2013-01-05 (×3): qty 0.3

## 2013-01-05 NOTE — Progress Notes (Addendum)
Patient admitted by Dr. Lovell Sheehan this AM.  Please see H&P.  Appreciate nephrology consult.  Continue to follow results of diuresis. Notified SE of admission Marlin Canary DO

## 2013-01-05 NOTE — H&P (Addendum)
[facility] History and Physical  [name] ZOX:[id number] DOB: 05/27/1923 DOA: 01/04/2013  Referring physician:  EDP PCP: Georgianne Fick, MD  Specialists:   Chief Complaint:  SOB and Wheezing  HPI: [name] is a 79 y.o. female with Multiple Medical Problems including CAD, CHF, Myxedema Cardiomyopathy and CKD Stage III- V, who was brought to the ED with complaints of worsening SOB and Wheezing over the past 24 hours. EMS was called to her home and found her O2 sats to be 70% and she was placed on supplemental O2, and had mild improvement.   Her ED evaluation, revealed a BNP =1657.2, and and BUN /Cr = 74/4.18, and a K= level of 5.3    She denied having any chest pain or fevers or chills.  In the ED she was evaluated and administered Lasix 40 Mg IV X 1 without a response.   She was referred for medical admission.    Of Note, she had been seen by her Cardiologist  Dr. Rennis Golden of [facility] on 10/15 and was found to have an increase in her BUN/Cr so her Diuretic was decreased to once daily.   She also has a Dialysis Fistula in her Left Arm that she has not used yet because her kidney numbers had been improvng, and she reports that she has been under surveillance by Nephrology although the Renal Reports are not available in the [facility] system.  Her dialysis Fistula was placed in 05/2012 by Vascular [name].   A Renal Consultation was placed in the AM to Dr. Eliott Nine of [facility] to see the patient.      Review of Systems: The patient denies anorexia, fever, chills, headaches, weight loss, vision loss, diplopia, dizziness, decreased hearing, rhinitis, hoarseness, chest pain, syncope,  peripheral edema, balance deficits, cough, hemoptysis, abdominal pain, nausea, vomiting, diarrhea, constipation, hematemesis, melena, hematochezia, severe indigestion/heartburn, dysuria, hematuria, incontinence, suspicious skin lesions, transient blindness, difficulty walking, depression,  unusual weight change, abnormal bleeding, enlarged lymph nodes, angioedema, and breast masses.    Past Medical History  Diagnosis Date  . Coronary artery disease   . TIA (transient ischemic attack)   . Hypertension   . Shingles   . Stroke   . Anemia   . CAD (coronary artery disease) 02/08/2011  . Cardiomyopathy, idiopathic 02/08/2011  . Chronic kidney disease   . Angina     Takes Isosorbide  . Unstable angina 02/08/2011  . Myocardial infarct     x 3 unsure of years  . Irregular heartbeat   . Ulcer   . GERD (gastroesophageal reflux disease)     takes Protonix and zantac  . Hx of transient ischemic attack (TIA)   . Memory loss   . CHF (congestive heart failure)     Takes Lasix  . Gout     takes allopurinol  . Peripheral vascular disease     left lower  leg  . Diabetes mellitus     type 2 NIDDM x 2 years; no meds  . Chronic kidney disease (CKD), stage IV (severe)   . Arthritis   . Shortness of breath   . Thyroid disease   . Hypothyroidism     (SEVERE) Takes Levothryroxine  . Hypothyroid 02/08/2011  . Nonischemic cardiomyopathy     EF now is 55%, reduced due to myxedema, which is improved.  . Dyslipidemia   . Peripheral neuropathy   . H/O echocardiogram 09/06/11    Indication- nonIschemic Cardiomyopathy. EF = now greater than  55% with no regional wall motion abnormalities. Tthere is mild to moderate trisuspid regurgitayion and mild pulmonary hypertension with an RVSP of 35 mmHg as well as stage 1 diastolic dysfunction and mild to moderate LVH.  Marland Kitchen Abnormal nuclear stress test 06/01/09    Demonstrated a new area of infarct scar, peri-infarct ischemia seen in the inferolateral territory. EF eas normal at 70% with mild hypocontractility at the apex, distal inferolateral wall.    Past Surgical History  Procedure Laterality Date  . Back surgery      [facility]  . Eye surgery      Left eye surgery; cataract removal  . Left bka  07/05/2011  . Av fistula placement     . Amputation  07/05/2011    Procedure: AMPUTATION BELOW KNEE;  Surgeon: Chuck Hint, MD;  Location: [facility] OR;  Service: Vascular;  Laterality: Left;  . Angioplasty  1988  . Cardiac catheterization  09/28/07    Demonstrated multiple sequential lesions around 40 to 30% in the RCA territory.  . Left lower extremity venous duplex Left 06/27/11    Summary: No evidence of DVT involving the left lower extremity and right common femoral vein.   . Lower extremity arterial evaluation  06/27/11    SUMMARY: Right: ABI not ascertained due to false elevation in BP secondary to calcification (posterior tibial artery is non compressible). Left: ABI indicates moderate reduction in arterial flow. Bilateral: Great toe PPG waveforms indicate adequate perfusion. Great toe pressures not obtained due to patient's movements secondary to pain.  . Duplex doppler  05/10/11    LE arterial dopplers demonstrate bilaterally reduced ABIs of 0.91 on right & 0.56 on left. She does report some decreased pain on the left, & there's moderate mixed-density plaque in the right SFA w/50 to 69% reduction. There's a 69% reduction in the left SFA & does appear to be occlusive disease of left posterior tibial artery. Right posterior dorsalis pedis artery demonstrates occlusive disease    Prior to Admission medications   Medication Sig Start Date End Date Taking? Authorizing Provider  allopurinol (ZYLOPRIM) 100 MG tablet Take 100 mg by mouth daily.    Yes Historical Provider, MD  amLODipine (NORVASC) 10 MG tablet Take 10 mg by mouth daily.   Yes Historical Provider, MD  aspirin 81 MG tablet Take 81 mg by mouth daily.   Yes Historical Provider, MD  carvedilol (COREG) 12.5 MG tablet Take 1 tablet (12.5 mg total) by mouth 2 (two) times daily with a meal. 02/08/12  Yes Gwenyth Bender, NP  clopidogrel (PLAVIX) 75 MG tablet Take 75 mg by mouth daily.   Yes Historical Provider, MD  furosemide (LASIX) 40 MG tablet Take 40 mg by mouth daily.    Yes  Historical Provider, MD  gabapentin (NEURONTIN) 300 MG capsule Take 300 mg by mouth 2 (two) times daily. 03/24/11  Yes Delanna Notice, MD  guanFACINE (INTUNIV) 2 MG TB24 SR tablet Take 2 mg by mouth daily.   Yes Historical Provider, MD  isosorbide mononitrate (IMDUR) 60 MG 24 hr tablet Take 60 mg by mouth daily.    Yes Historical Provider, MD  levothyroxine (SYNTHROID, LEVOTHROID) 75 MCG tablet Take 75 mcg by mouth daily before breakfast.   Yes Historical Provider, MD  nitroGLYCERIN (NITROSTAT) 0.4 MG SL tablet Place 0.4 mg under the tongue every 5 (five) minutes as needed for chest pain.    Yes Historical Provider, MD  pravastatin (PRAVACHOL) 40 MG tablet Take 40 mg by  mouth daily.   Yes Historical Provider, MD  saxagliptin HCl (ONGLYZA) 5 MG TABS tablet Take 5 mg by mouth daily.   Yes Historical Provider, MD  travoprost, benzalkonium, (TRAVATAN) 0.004 % ophthalmic solution Place into both eyes at bedtime.   Yes Historical Provider, MD    Allergies  Allergen Reactions  . Aspirin Nausea And Vomiting    325 mg (adult strength) [name]     Social History:  reports that she quit smoking about 26 years ago. She has never used smokeless tobacco. She reports that she does not drink alcohol or use illicit drugs.     Family History  Problem Relation Age of Onset  . Cancer Sister     STOMACH  . Diabetes Sister   . Cancer Brother     BONE  . Diabetes Brother   . Anesthesia problems Neg Hx   . Hypotension Neg Hx   . Malignant hyperthermia Neg Hx   . Pseudochol deficiency Neg Hx   . Hyperlipidemia Daughter   . Hypertension Daughter   . Heart disease Daughter     before age 74  . Kidney disease Daughter   . Heart attack Daughter   . Other Daughter     varicose veins  . Diabetes Daughter   . Heart disease Son     before age 76  . Hyperlipidemia Son   . Hypertension Son   . Heart attack Son        Physical Exam:  GEN:   Pleasant Obese Elderly 79 y.o. African American female  examined  and in no acute distress; cooperative with exam Filed Vitals:   01/05/13 0245 01/05/13 0300 01/05/13 0400 01/05/13 0500  BP: 121/38 148/45 114/37 114/37  Pulse: 42 44 41 42  Temp:    97.1 F (36.2 C)  TempSrc:    Axillary  Resp: 14 25 14 14   Height:      Weight:      SpO2: 99% 98% 98% 98%   Blood pressure 114/37, pulse 42, temperature 97.1 F (36.2 C), temperature source Axillary, resp. rate 14, height 4\' 11"  (1.499 m), weight 63.2 kg (139 lb 5.3 oz), SpO2 98.00%. PSYCH: She is alert and oriented x4; does not appear anxious does not appear depressed; affect is normal HEENT: Normocephalic and Atraumatic, Mucous membranes pink; PERRLA; EOM intact; Fundi:  Benign;  No scleral icterus, Nares: Patent, Oropharynx: Clear, Fair Dentition, Neck:  FROM, no cervical lymphadenopathy nor thyromegaly or carotid bruit; no JVD; Breasts:: Not examined CHEST WALL: No tenderness CHEST: Normal respiration, clear to auscultation bilaterally HEART: Regular rate and rhythm; no murmurs rubs or gallops BACK: No kyphosis or scoliosis; no CVA tenderness ABDOMEN: Positive Bowel Sounds, Obese, soft non-tender; no masses, no organomegaly.   Rectal Exam: Not done EXTREMITIES: No cyanosis, clubbing or edema; no ulcerations. Genitalia: not examined PULSES: 2+ and symmetric SKIN: Normal hydration no rash or ulceration CNS: Cranial nerves 2-12 grossly intact no focal neurologic deficit    Labs on Admission:  Basic Metabolic Panel:  Recent Labs Lab 01/01/13 0951 01/04/13 2315  NA 137 137  K 4.3 5.3*  CL 103 102  CO2 24 20  GLUCOSE 132* 192*  BUN 57* 74*  CREATININE 2.70* 4.18*  CALCIUM 9.1 8.3*   Liver Function Tests:  Recent Labs Lab 01/04/13 2315  AST 25  ALT 18  ALKPHOS 99  BILITOT 0.2*  PROT 6.4  ALBUMIN 3.4*  No results found for this basename: LIPASE, AMYLASE,  in the last 168 hours No results found for this basename:  AMMONIA,  in the last 168 hours CBC:  Recent Labs Lab 01/04/13 2315  WBC 8.8  NEUTROABS 5.8  HGB 11.0*  HCT 33.4*  MCV 87.0  PLT 350   Cardiac Enzymes:  Recent Labs Lab 01/04/13 2315 01/05/13 0130  TROPONINI <0.30 <0.30    BNP (last 3 results)  Recent Labs  01/04/13 2315  PROBNP 16572.0*   CBG:  Recent Labs Lab 01/05/13 0317  GLUCAP 285*    Radiological Exams on Admission: Dg Chest Port 1 View  01/05/2013   CLINICAL DATA:  Shortness of breath  EXAM: PORTABLE CHEST - 1 VIEW  COMPARISON:  02/06/2012  FINDINGS: Cardiopericardial enlargement. Rightward deviation of the trachea at the level of the aortic knob, accentuated from priors by portable technique. A similar appearance seen 10/07/2010. Remaining upper mediastinal contours stable.  Indistinct, streaky opacities in the lower lungs, left more than right. No overt edema. Possible small effusions. No pneumothorax.  IMPRESSION: Lower lung interstitial opacities which may represent atelectasis, pneumonitis, or infection.   Electronically Signed   By: Tiburcio Pea M.D.   On: 01/05/2013 00:13     ZOX:[id number].     Assessment/Plan Principal Problem:   Acute on chronic diastolic CHF (congestive heart failure), NYHA class 3 Active Problems:   SOB (shortness of breath)   CHF exacerbation   Acute on chronic renal failure   CKD (chronic kidney disease) stage 3, GFR 30-59 ml/min   Myxedema heart disease   Hyperkalemia   CAD (coronary artery disease)   Hypothyroid   Anemia   Peripheral vascular disease, unspecified   Bradycardia    1.   Acute on Chronic Diastolic CHF-  Admit to Stepdown Bed,  BIPAP PRN if Respiratory Distress,   [place] O2,  CHF Protocol, attempt to Diurese: given PO Metalazone 2.5 mg X 1, with IV lasix 80 mg 30 minutes later and moniotring BPS, since BPs have been on the low side.    Administer IV lasix as BP tolerates , may need a Vasopressor if Hypotensive.   Renal Consult in AM may need Dialysis  if not diuresing.  Her Cardiologist is Dr. Rennis Golden of [facility].   EDP was asked to consult her Cardiology Group On Call.    2.   SOB due to #1.    3.   Acute on Chronic Renal Failure-   - Seen by renal in past and has been under surveillance, and had been improving and not needed Dialysis, but has Renal Catheter in place in event of needed Dialysis.         4.   CAD, and Myxedema Heart Disease-  Seen by Cards: Dr Rennis Golden of [facility].  Beta blocker Rx on hold due to bradycardia.     5.   Hyperkalemia- Mildly elevated at 5.3 on admission,   monitor trend, may need Kayexalate Rx, Should improve with Diuresis, if not would improve with Dialysis if needed.     6.   Hypertension-  Current BP meds on hold due to hypotension,   Holding her Beta blocker Rx due to Bradycardia.       7.   Hypothyroid-  Check TSH levels  8.   Hypotension-   Check Cortisol level, Hold BP Meds.    9.   Bradycardia- due to ARF and Beta blocker Rx and Amlodipine, however check troponins.    10.  Anemia- stable , monitor trend.     11.  PVD-  Hx continue on Plavix  Rx   No acute changes.       Code Status:   FULL CODE Family Communication:    Family at Bedside, 2 Daughters Disposition Plan:       Inpatient  Time spent:  31 Minutes  Ron Parker [facility] Pager [phone]    If 7PM-7AM, please contact night-coverage [email or web address] Password [facility] 01/05/2013, 6:46 AM

## 2013-01-05 NOTE — Consult Note (Signed)
Requesting Physician:  Dr. Lovell Sheehan Reason for Consult:  Renal failure, CHF HPI: The patient is a 79 y.o. year-old AAF with known CKD (followed by Dr. Beryle Lathe), CHF/idiopathic cardiomyopathy/EF 55% 01/2012), PAD with h/o BKA. Has a left basilic transposition fistula created 06/2011 by Dr. Cari Caraway.  Had been seen recently (10/14) by her cardiologist who had reduced her furosemide from 80 mg BID to 80 mg QD because of an increase in her creatinine to 2.7.  She was subsequently admitted to the hospital last evening with an exacerbation of CHF, worsening of her renal function, and did not respond to Lasix 40, then 80 - has had essentially no urine output.  We as asked to see.  Creatinine, Ser  Date/Time Value Range Status  01/04/2013 11:15 PM 4.18* 0.50 - 1.10 mg/dL Final  40/98/11 2.7    02/08/2012  9:51 AM 1.71* 0.50 - 1.10 mg/dL Final  91/47/8295  6:21 AM 1.48* 0.50 - 1.10 mg/dL Final  30/86/5784 69:62 PM 1.57* 0.50 - 1.10 mg/dL Final  9/52/8413  2:44 AM 1.70* 0.50 - 1.10 mg/dL Final  0/12/2723  3:66 AM 1.90* 0.50 - 1.10 mg/dL Final  4/40/3474  2:59 AM 1.76* 0.50 - 1.10 mg/dL Final  5/63/8756  4:33 AM 1.72* 0.50 - 1.10 mg/dL Final  2/95/1884  1:66 AM 1.82* 0.50 - 1.10 mg/dL Final  0/63/0160  1:09 AM 2.09* 0.50 - 1.10 mg/dL Final  06/11/5571  2:20 AM 2.55* 0.50 - 1.10 mg/dL Final  2/54/2706  2:37 PM 2.66* 0.50 - 1.10 mg/dL Final  08/20/8313 17:61 PM 1.74* 0.50 - 1.10 mg/dL Final  6/0/7371 06:26 AM 1.82* 0.50 - 1.10 mg/dL Final  94/85/4627  0:35 AM 2.71* 0.50 - 1.10 mg/dL Final  00/93/8182  9:93 PM 2.76* 0.50 - 1.10 mg/dL Final  10/03/9676  9:38 AM 3.03* 0.50 - 1.10 mg/dL Final  03/21/7508  2:58 AM 3.10* 0.50 - 1.10 mg/dL Final  08/15/7822  2:35 AM 2.85* 0.50 - 1.10 mg/dL Final  3/61/4431 54:00 AM 2.34* 0.50 - 1.10 mg/dL Final  8/67/6195  0:93 AM 1.88* 0.50 - 1.10 mg/dL Final  2/67/1245  8:09 AM 2.01* 0.50 - 1.10 mg/dL Final  9/83/3825  0:53 AM 1.83* 0.50 - 1.10 mg/dL Final   9/76/7341  9:37 AM 1.80* 0.50 - 1.10 mg/dL Final  11/21/4095  3:53 AM 1.62* 0.50 - 1.10 mg/dL Final  2/99/2426 83:41 PM 2.10* 0.50 - 1.10 mg/dL Final  11/25/2227 79:89 PM 1.71* 0.4 - 1.2 mg/dL Final  04/22/1939  7:40 PM 1.45* 0.4 - 1.2 mg/dL Final  11/01/4816 56:31 PM 1.91* 0.4 - 1.2 mg/dL Final  4/97/0263 78:58 PM 1.72* 0.4 - 1.2 mg/dL Final  8/50/2774  1:28 AM 2.85*  Final  10/02/2007 12:20 PM 2.69*  Final  10/02/2007 10:55 AM 2.76*  Final  10/01/2007  3:59 AM 1.92*  Final  09/30/2007  5:30 AM 1.88*  Final  09/29/2007  3:35 AM 1.83*  Final  09/28/2007 11:30 AM 1.75*  Final  09/27/2007  1:37 PM 1.78*  Final  09/26/2007  5:55 AM 1.71*  Final  09/25/2007  5:00 PM 1.69*  Final  09/24/2007  1:40 AM 1.57*  Final  09/23/2007  1:06 PM 1.9*  Final  09/23/2007 12:50 PM 1.71*  Final    Past Medical History:  Past Medical History  Diagnosis Date  . Coronary artery disease   . TIA (transient ischemic attack)   . Hypertension   . Shingles   . Stroke   . Anemia   .  CAD (coronary artery disease) 02/08/2011  . Cardiomyopathy, idiopathic 02/08/2011  . Chronic kidney disease   . Angina     Takes Isosorbide  . Unstable angina 02/08/2011  . Myocardial infarct     x 3 unsure of years  . Irregular heartbeat   . Ulcer   . GERD (gastroesophageal reflux disease)     takes Protonix and zantac  . Hx of transient ischemic attack (TIA)   . Memory loss   . CHF (congestive heart failure)     Takes Lasix  . Gout     takes allopurinol  . Peripheral vascular disease     left lower  leg  . Diabetes mellitus     type 2 NIDDM x 2 years; no meds  . Chronic kidney disease (CKD), stage IV (severe)   . Arthritis   . Shortness of breath   . Thyroid disease   . Hypothyroidism     (SEVERE) Takes Levothryroxine  . Hypothyroid 02/08/2011  . Nonischemic cardiomyopathy     EF now is 55%, reduced due to myxedema, which is improved.  . Dyslipidemia   . Peripheral neuropathy   . H/O echocardiogram 09/06/11    Indication-  nonIschemic Cardiomyopathy. EF = now greater than 55% with no regional wall motion abnormalities. Tthere is mild to moderate trisuspid regurgitayion and mild pulmonary hypertension with an RVSP of 35 mmHg as well as stage 1 diastolic dysfunction and mild to moderate LVH.  Marland Kitchen Abnormal nuclear stress test 06/01/09    Demonstrated a new area of infarct scar, peri-infarct ischemia seen in the inferolateral territory. EF eas normal at 70% with mild hypocontractility at the apex, distal inferolateral wall.    Past Surgical History:  Past Surgical History  Procedure Laterality Date  . Back surgery      [facility]  . Eye surgery      Left eye surgery; cataract removal  . Left bka  07/05/2011  . Av fistula placement    . Amputation  07/05/2011    Procedure: AMPUTATION BELOW KNEE;  Surgeon: Chuck Hint, MD;  Location: [facility] OR;  Service: Vascular;  Laterality: Left;  . Angioplasty  1988  . Cardiac catheterization  09/28/07    Demonstrated multiple sequential lesions around 40 to 30% in the RCA territory.  . Left lower extremity venous duplex Left 06/27/11    Summary: No evidence of DVT involving the left lower extremity and right common femoral vein.   . Lower extremity arterial evaluation  06/27/11    SUMMARY: Right: ABI not ascertained due to false elevation in BP secondary to calcification (posterior tibial artery is non compressible). Left: ABI indicates moderate reduction in arterial flow. Bilateral: Great toe PPG waveforms indicate adequate perfusion. Great toe pressures not obtained due to patient's movements secondary to pain.  . Duplex doppler  05/10/11    LE arterial dopplers demonstrate bilaterally reduced ABIs of 0.91 on right & 0.56 on left. She does report some decreased pain on the left, & there's moderate mixed-density plaque in the right SFA w/50 to 69% reduction. There's a 69% reduction in the left SFA & does appear to be occlusive disease of left posterior tibial artery. Right  posterior dorsalis pedis artery demonstrates occlusive disease    Family History:  Family History  Problem Relation Age of Onset  . Cancer Sister     STOMACH  . Diabetes Sister   . Cancer Brother     BONE  . Diabetes Brother   .  Anesthesia problems Neg Hx   . Hypotension Neg Hx   . Malignant hyperthermia Neg Hx   . Pseudochol deficiency Neg Hx   . Hyperlipidemia Daughter   . Hypertension Daughter   . Heart disease Daughter     before age 67  . Kidney disease Daughter   . Heart attack Daughter   . Other Daughter     varicose veins  . Diabetes Daughter   . Heart disease Son     before age 55  . Hyperlipidemia Son   . Hypertension Son   . Heart attack Son    Social History:  reports that she quit smoking about 26 years ago. She has never used smokeless tobacco. She reports that she does not drink alcohol or use illicit drugs.  Allergies:  Allergies  Allergen Reactions  . Aspirin Nausea And Vomiting    325 mg (adult strength) [name]     Home medications: Prior to Admission medications   Medication Sig Start Date End Date Taking? Authorizing Provider  allopurinol (ZYLOPRIM) 100 MG tablet Take 100 mg by mouth daily.    Yes Historical Provider, MD  amLODipine (NORVASC) 10 MG tablet Take 10 mg by mouth daily.   Yes Historical Provider, MD  aspirin 81 MG tablet Take 81 mg by mouth daily.   Yes Historical Provider, MD  carvedilol (COREG) 12.5 MG tablet Take 1 tablet (12.5 mg total) by mouth 2 (two) times daily with a meal. 02/08/12  Yes Gwenyth Bender, NP  clopidogrel (PLAVIX) 75 MG tablet Take 75 mg by mouth daily.   Yes Historical Provider, MD  furosemide (LASIX) 40 MG tablet Take 40 mg by mouth daily.    Yes Historical Provider, MD  gabapentin (NEURONTIN) 300 MG capsule Take 300 mg by mouth 2 (two) times daily. 03/24/11  Yes Delanna Notice, MD  guanFACINE (INTUNIV) 2 MG TB24 SR tablet Take 2 mg by mouth daily.   Yes  Historical Provider, MD  isosorbide mononitrate (IMDUR) 60 MG 24 hr tablet Take 60 mg by mouth daily.    Yes Historical Provider, MD  levothyroxine (SYNTHROID, LEVOTHROID) 75 MCG tablet Take 75 mcg by mouth daily before breakfast.   Yes Historical Provider, MD  nitroGLYCERIN (NITROSTAT) 0.4 MG SL tablet Place 0.4 mg under the tongue every 5 (five) minutes as needed for chest pain.    Yes Historical Provider, MD  pravastatin (PRAVACHOL) 40 MG tablet Take 40 mg by mouth daily.   Yes Historical Provider, MD  saxagliptin HCl (ONGLYZA) 5 MG TABS tablet Take 5 mg by mouth daily.   Yes Historical Provider, MD  travoprost, benzalkonium, (TRAVATAN) 0.004 % ophthalmic solution Place into both eyes at bedtime.   Yes Historical Provider, MD    Inpatient medications: . allopurinol  100 mg Oral Daily  . clopidogrel  75 mg Oral Daily  . enoxaparin (LOVENOX) injection  30 mg Subcutaneous Daily  . furosemide  160 mg Intravenous Q6H  . gabapentin  300 mg Oral BID  . insulin aspart  0-9 Units Subcutaneous Q4H  . isosorbide mononitrate  60 mg Oral Daily  . levothyroxine  75 mcg Oral QAC breakfast  . [START ON 01/06/2013] pneumococcal 23 valent vaccine  0.5 mL Intramuscular Tomorrow-1000  . simvastatin  20 mg Oral q1800  . sodium chloride  3 mL Intravenous Q12H  . Travoprost (BAK Free)  1 drop Both Eyes QHS    Review of Systems Gen:  Denies headache, fever, chills, sweats.  No weight loss. HEENT:  No visual change, sore throat, difficulty swallowing. Resp:  Fairly abrupt onset of dyspnea, at rest and with exertion Cardiac:  No chest pain, orthopnea, PND.  Chronic issues with peripheral edema GI:   Denies abdominal pain but does report abdominal bloating   No nausea, vomiting, diarrhea.  No constipation. GU:  Denies difficulty or change in voiding.  No change in urine color.     MS:  Denies joint pain or swelling.   Derm:  Denies skin rash or itching.  No chronic skin conditions.  Neuro:   Denies focal  weakness, memory problems, hx stroke or TIA.   Psych:  Denies symptoms of depression of anxiety.  No hallucination.     Physical Exam:  Blood pressure 134/42, pulse 50, temperature 97.5 F (36.4 C), temperature source Oral, resp. rate 18, height 4\' 11"  (1.499 m), weight 63.2 kg (139 lb 5.3 oz), SpO2 99.00%.  Gen: elderly BF  Mildly dyspneic Skin: no rash, cyanosis Neck: +JVF   Chest: Crackles at lung bases Heart: Bradycardic, regular S1S2 no audible S3 Abdomen: soft, protuberant, no focal tenderness Ext: left BKA Right LE 1-2+ edema Neuro: alert, Ox3, no focal deficit Heme/Lymph: no bruising or LAN Foley - min urine Left AVF with soft bruit  Labs: Basic Metabolic Panel:  Recent Labs Lab 01/01/13 0951 01/04/13 2315  NA 137 137  K 4.3 5.3*  CL 103 102  CO2 24 20  GLUCOSE 132* 192*  BUN 57* 74*  CREATININE 2.70* 4.18*  CALCIUM 9.1 8.3*   Liver Function Tests:  Recent Labs Lab 01/04/13 2315  AST 25  ALT 18  ALKPHOS 99  BILITOT 0.2*  PROT 6.4  ALBUMIN 3.4*   Recent Labs Lab 01/04/13 2315  WBC 8.8  NEUTROABS 5.8  HGB 11.0*  HCT 33.4*  MCV 87.0  PLT 350   Recent Labs Lab 01/04/13 2315 01/05/13 0130  TROPONINI <0.30 <0.30   CBG:  Recent Labs Lab 01/05/13 0317  GLUCAP 285*   Dg Chest Port 1 View  01/05/2013   CLINICAL DATA:  Shortness of breath  EXAM: PORTABLE CHEST - 1 VIEW  COMPARISON:  02/06/2012  FINDINGS: Cardiopericardial enlargement. Rightward deviation of the trachea at the level of the aortic knob, accentuated from priors by portable technique. A similar appearance seen 10/07/2010. Remaining upper mediastinal contours stable.  Indistinct, streaky opacities in the lower lungs, left more than right. No overt edema. Possible small effusions. No pneumothorax.  IMPRESSION: Lower lung interstitial opacities which may represent atelectasis, pneumonitis, or infection.   Electronically Signed   By: Tiburcio Pea M.D.   On: 01/05/2013 00:13    Impression/Plan 1. AKI on CKD4 - possibly due to exacerbation in CHF as result of diuretic dose reduction.  No response to 40, then 80 of lasix. Will try to diurese with max dose lasix and if no response attempt to use her AVF (which is rather marginal by my exam) 2. CHF - As above. Per notes cardiac issues in the past attributed to myxedema. More recent notes by Dr. Rennis Golden indicate EF had returned to normal.  In 01/2012 EF was 55%  Would be reasonable to check TSH and to look at a repeat echo 3. Hypothyroidism - Prio h/o myxedema. Check TSH 4. PAD - stable 5. Hyperkalemia  - mild at present. Restrict diet. 6. 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 Check ca, phos, PTH  Camille Bal,  MD [facility] [phone] pager 01/05/2013, 7:54 AM

## 2013-01-05 NOTE — Consult Note (Signed)
Reason for Consult: Acute CHF  Requesting Physician: [facility]  HPI: This is a 79 y.o. 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 The last echo I could find was from June 2013 and her EF was 55%. She has diastolic dysfunction. She also has stage 4 CRI with an AVF in her her LUE but is not on HD yet, IDDM, PVD with previous Lt BKA, HTN, and moderate CAD by cath July 2013. She recently saw Dr Rennis Golden in the office and her diuretics were increased (Lasix 80 mg BID). She had improvement in her symptoms but never had follow up labs drawn. Dr Rennis Golden saw her in the office 01/01/13 and the family says her Lasix was decreased. She presents now with acute on chronic diastolic CHF and respiratory failure. She has been noted to be bradycardiac on telemetry with rates in the 40s. Her diuretics are being managed by the renal service, she also had acute on chronic renal failure on admission with a SCr up to 4.18 from 2.70 on 10/14.   PMHx:  Past Medical History  Diagnosis Date  . Coronary artery disease   . TIA (transient ischemic attack)   . Hypertension   . Shingles   . Stroke   . Anemia   . CAD (coronary artery disease) 02/08/2011  . Cardiomyopathy, idiopathic 02/08/2011  . Chronic kidney disease   . Angina     Takes Isosorbide  . Unstable angina 02/08/2011  . Myocardial infarct     x 3 unsure of years  . Irregular heartbeat   . Ulcer   . GERD (gastroesophageal reflux disease)     takes Protonix and zantac  . Hx of transient ischemic attack (TIA)   . Memory loss   . CHF (congestive heart failure)     Takes Lasix  . Gout     takes allopurinol  . Peripheral vascular disease     left lower  leg  . Diabetes mellitus     type 2 NIDDM x 2 years; no meds  . Chronic kidney disease (CKD), stage IV (severe)   . Arthritis   . Shortness of breath   . Thyroid disease   . Hypothyroidism     (SEVERE) Takes Levothryroxine  . Hypothyroid 02/08/2011   . Nonischemic cardiomyopathy     EF now is 55%, reduced due to myxedema, which is improved.  . Dyslipidemia   . Peripheral neuropathy   . H/O echocardiogram 09/06/11    Indication- nonIschemic Cardiomyopathy. EF = now greater than 55% with no regional wall motion abnormalities. Tthere is mild to moderate trisuspid regurgitayion and mild pulmonary hypertension with an RVSP of 35 mmHg as well as stage 1 diastolic dysfunction and mild to moderate LVH.  Marland Kitchen Abnormal nuclear stress test 06/01/09    Demonstrated a new area of infarct scar, peri-infarct ischemia seen in the inferolateral territory. EF eas normal at 70% with mild hypocontractility at the apex, distal inferolateral wall.   Past Surgical History  Procedure Laterality Date  . Back surgery      [facility]  . Eye surgery      Left eye surgery; cataract removal  . Left bka  07/05/2011  . Av fistula placement    . Amputation  07/05/2011    Procedure: AMPUTATION BELOW KNEE;  Surgeon: Chuck Hint, MD;  Location: [facility] OR;  Service: Vascular;  Laterality: Left;  . Angioplasty  1988  . Cardiac catheterization  09/28/07  Demonstrated multiple sequential lesions around 40 to 30% in the RCA territory.  . Left lower extremity venous duplex Left 06/27/11    Summary: No evidence of DVT involving the left lower extremity and right common femoral vein.   . Lower extremity arterial evaluation  06/27/11    SUMMARY: Right: ABI not ascertained due to false elevation in BP secondary to calcification (posterior tibial artery is non compressible). Left: ABI indicates moderate reduction in arterial flow. Bilateral: Great toe PPG waveforms indicate adequate perfusion. Great toe pressures not obtained due to patient's movements secondary to pain.  . Duplex doppler  05/10/11    LE arterial dopplers demonstrate bilaterally reduced ABIs of 0.91 on right & 0.56 on left. She does report some decreased pain on the left, & there's moderate mixed-density  plaque in the right SFA w/50 to 69% reduction. There's a 69% reduction in the left SFA & does appear to be occlusive disease of left posterior tibial artery. Right posterior dorsalis pedis artery demonstrates occlusive disease    FAMHx: remarkable for diabetes, and CAD   SOCHx:  reports that she quit smoking about 26 years ago. She has never used smokeless tobacco. She reports that she does not drink alcohol or use illicit drugs.She lives with her son  ALLERGIES: Allergies  Allergen Reactions  . Aspirin Nausea And Vomiting    325 mg (adult strength) [name]     ROS: Pertinent items are noted in HPI. See H&P for complete ROS. Her daughter says the pt had a syncopal spell two weeks ago in the setting of a BS of 27.  HOME MEDICATIONS: Prescriptions prior to admission  Medication Sig Dispense Refill  . allopurinol (ZYLOPRIM) 100 MG tablet Take 100 mg by mouth daily.       Marland Kitchen amLODipine (NORVASC) 10 MG tablet Take 10 mg by mouth daily.      Marland Kitchen aspirin 81 MG tablet Take 81 mg by mouth daily.      . carvedilol (COREG) 12.5 MG tablet Take 1 tablet (12.5 mg total) by mouth 2 (two) times daily with a meal.  30 tablet  0  . clopidogrel (PLAVIX) 75 MG tablet Take 75 mg by mouth daily.      . furosemide (LASIX) 40 MG tablet Take 40 mg by mouth daily.       Marland Kitchen gabapentin (NEURONTIN) 300 MG capsule Take 300 mg by mouth 2 (two) times daily.      Marland Kitchen guanFACINE (INTUNIV) 2 MG TB24 SR tablet Take 2 mg by mouth daily.      . isosorbide mononitrate (IMDUR) 60 MG 24 hr tablet Take 60 mg by mouth daily.       Marland Kitchen levothyroxine (SYNTHROID, LEVOTHROID) 75 MCG tablet Take 75 mcg by mouth daily before breakfast.      . nitroGLYCERIN (NITROSTAT) 0.4 MG SL tablet Place 0.4 mg under the tongue every 5 (five) minutes as needed for chest pain.       . pravastatin (PRAVACHOL) 40 MG tablet Take 40 mg by mouth daily.      . saxagliptin HCl (ONGLYZA) 5 MG TABS  tablet Take 5 mg by mouth daily.      . travoprost, benzalkonium, (TRAVATAN) 0.004 % ophthalmic solution Place into both eyes at bedtime.        HOSPITAL MEDICATIONS: I have reviewed the patient's current medications.  VITALS: Blood pressure 136/101, pulse 48, temperature 97.5 F (36.4 C), temperature source  Oral, resp. rate 19, height 4\' 11"  (1.499 m), weight 139 lb 5.3 oz (63.2 kg), SpO2 97.00%.  PHYSICAL EXAM: General appearance: alert, cooperative, no distress and moderately obese Neck: no carotid bruit and no JVD Lungs: decreased breath sounds at bases with some scattered expiratory wheezing Heart: regular rate and rhythm Abdomen: protuberant Extremities: Lt BKA Pulses: decreased Rt LE pulse, LUE AVF with palpable pulse Skin: cool and dry Neurologic: Grossly normal  LABS: Results for orders placed during the hospital encounter of 01/04/13 (from the past 48 hour(s))  CBC WITH DIFFERENTIAL     Status: Abnormal   Collection Time    01/04/13 11:15 PM      Result Value Range   WBC 8.8  4.0 - 10.5 K/uL   Comment: WHITE COUNT CONFIRMED ON SMEAR   RBC 3.84 (*) 3.87 - 5.11 MIL/uL   Hemoglobin 11.0 (*) 12.0 - 15.0 g/dL   HCT 40.9 (*) 81.1 - 91.4 %   MCV 87.0  78.0 - 100.0 fL   MCH 28.6  26.0 - 34.0 pg   MCHC 32.9  30.0 - 36.0 g/dL   RDW 78.2 (*) 95.6 - 21.3 %   Platelets 350  150 - 400 K/uL   Neutrophils Relative % 65  43 - 77 %   Lymphocytes Relative 21  12 - 46 %   Monocytes Relative 8  3 - 12 %   Eosinophils Relative 6 (*) 0 - 5 %   Basophils Relative 0  0 - 1 %   Neutro Abs 5.8  1.7 - 7.7 K/uL   Lymphs Abs 1.8  0.7 - 4.0 K/uL   Monocytes Absolute 0.7  0.1 - 1.0 K/uL   Eosinophils Absolute 0.5  0.0 - 0.7 K/uL   Basophils Absolute 0.0  0.0 - 0.1 K/uL   RBC Morphology POLYCHROMASIA PRESENT     Comment: ELLIPTOCYTES  COMPREHENSIVE METABOLIC PANEL     Status: Abnormal   Collection Time    01/04/13 11:15 PM      Result Value Range   Sodium 137  135 - 145 mEq/L    Potassium 5.3 (*) 3.5 - 5.1 mEq/L   Chloride 102  96 - 112 mEq/L   CO2 20  19 - 32 mEq/L   Glucose, Bld 192 (*) 70 - 99 mg/dL   BUN 74 (*) 6 - 23 mg/dL   Creatinine, Ser 0.86 (*) 0.50 - 1.10 mg/dL   Calcium 8.3 (*) 8.4 - 10.5 mg/dL   Total Protein 6.4  6.0 - 8.3 g/dL   Albumin 3.4 (*) 3.5 - 5.2 g/dL   AST 25  0 - 37 U/L   ALT 18  0 - 35 U/L   Alkaline Phosphatase 99  39 - 117 U/L   Total Bilirubin 0.2 (*) 0.3 - 1.2 mg/dL   GFR calc non Af Amer 9 (*) >90 mL/min   GFR calc Af Amer 10 (*) >90 mL/min   Comment: (NOTE)     The eGFR has been calculated using the CKD EPI equation.     This calculation has not been validated in all clinical situations.     eGFR's persistently <90 mL/min signify possible Chronic Kidney     Disease.  PRO B NATRIURETIC PEPTIDE     Status: Abnormal   Collection Time    01/04/13 11:15 PM      Result Value Range   Pro B Natriuretic peptide (BNP) 16572.0 (*) 0 - 450 pg/mL  TROPONIN I  Status: None   Collection Time    01/04/13 11:15 PM      Result Value Range   Troponin I <0.30  <0.30 ng/mL   Comment:            Due to the release kinetics of cTnI,     a negative result within the first hours     of the onset of symptoms does not rule out     myocardial infarction with certainty.     If myocardial infarction is still suspected,     repeat the test at appropriate intervals.  TROPONIN I     Status: None   Collection Time    01/05/13  1:30 AM      Result Value Range   Troponin I <0.30  <0.30 ng/mL   Comment:            Due to the release kinetics of cTnI,     a negative result within the first hours     of the onset of symptoms does not rule out     myocardial infarction with certainty.     If myocardial infarction is still suspected,     repeat the test at appropriate intervals.  MRSA PCR SCREENING     Status: None   Collection Time    01/05/13  2:02 AM      Result Value Range   MRSA by PCR NEGATIVE  NEGATIVE   Comment:            The  GeneXpert MRSA Assay (FDA     approved for NASAL specimens     only), is one component of a     comprehensive MRSA colonization     surveillance program. It is not     intended to diagnose MRSA     infection nor to guide or     monitor treatment for     MRSA infections.  URINALYSIS, ROUTINE W REFLEX MICROSCOPIC     Status: Abnormal   Collection Time    01/05/13  3:02 AM      Result Value Range   Color, Urine YELLOW  YELLOW   APPearance CLOUDY (*) CLEAR   Specific Gravity, Urine 1.014  1.005 - 1.030   pH 5.0  5.0 - 8.0   Glucose, UA NEGATIVE  NEGATIVE mg/dL   Hgb urine dipstick NEGATIVE  NEGATIVE   Bilirubin Urine NEGATIVE  NEGATIVE   Ketones, ur NEGATIVE  NEGATIVE mg/dL   Protein, ur NEGATIVE  NEGATIVE mg/dL   Urobilinogen, UA 0.2  0.0 - 1.0 mg/dL   Nitrite NEGATIVE  NEGATIVE   Leukocytes, UA NEGATIVE  NEGATIVE   Comment: MICROSCOPIC NOT DONE ON URINES WITH NEGATIVE PROTEIN, BLOOD, LEUKOCYTES, NITRITE, OR GLUCOSE <1000 mg/dL.  GLUCOSE, CAPILLARY     Status: Abnormal   Collection Time    01/05/13  3:17 AM      Result Value Range   Glucose-Capillary 285 (*) 70 - 99 mg/dL  GLUCOSE, CAPILLARY     Status: Abnormal   Collection Time    01/05/13  7:52 AM      Result Value Range   Glucose-Capillary 342 (*) 70 - 99 mg/dL  TROPONIN I     Status: None   Collection Time    01/05/13  8:25 AM      Result Value Range   Troponin I <0.30  <0.30 ng/mL   Comment:            Due to the  release kinetics of cTnI,     a negative result within the first hours     of the onset of symptoms does not rule out     myocardial infarction with certainty.     If myocardial infarction is still suspected,     repeat the test at appropriate intervals.    EKG: Junctional rhythm 48  IMAGING: Dg Chest Port 1 View  01/05/2013   CLINICAL DATA:  Shortness of breath  EXAM: PORTABLE CHEST - 1 VIEW  COMPARISON:  02/06/2012  FINDINGS: Cardiopericardial enlargement. Rightward deviation of the trachea at the  level of the aortic knob, accentuated from priors by portable technique. A similar appearance seen 10/07/2010. Remaining upper mediastinal contours stable.  Indistinct, streaky opacities in the lower lungs, left more than right. No overt edema. Possible small effusions. No pneumothorax.  IMPRESSION: Lower lung interstitial opacities which may represent atelectasis, pneumonitis, or infection.   Electronically Signed   By: Tiburcio Pea M.D.   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 Beta blocker is on hold. Diuretics per renal service.   Time Spent Directly with Patient: 30 minutes  Abelino Derrick [phone] beeper 01/05/2013, 10:58 AM   I have seen and evaluated the patient this AM along with Corine Shelter, PA. I agree with his findings, examination as well as impression recommendations.  Very interesting situation - EF recovered from Myxedema HF, but has had diastolic HF.  Had some HF issues earlier this fall/late summer. Dr. Rennis Golden had increased her lasix -- was seen last week & after Labs showed worsening renal Fx, Lasix dose was reduced.  Now presents with 1 day of worsening SOB --> found to be in worsenign A on C RF (Cr now ~4) & in sinus Bradycardia (initially in the 40s).  Has rales on exam. -- c/w A on C Diastolic HF.   Echo was done this AM (will be on the lookout for it -- although would have preferred to have waited until HR was up.  HR  currently ~50 - BB held.  For now, I think the main issue is aggressive Diuresis / volume removal per Nephrology.   EF by Echo to be checked today - ? If she has acute drop in function. Continue to monitor Troponin levels.   If simply diastolic dysfunction - diuresis & afterload reduction is best Rx.  For now - hold BB & other antihypertensives (other than nitrate) to allow increased renal perfusion.  I suspect that her HR will increase with BB being held. No acute need for TPM / PPM at this time.  DM per TRH, Diuresis per Nephrology. We will continue to follow.  MD Time with pt: 15 min   HARDING,DAVID W, M.D., M.S. [facility] [address] [place], [place]  [address]  [phone] Pager # [phone] 01/05/2013 12:15 PM

## 2013-01-05 NOTE — Progress Notes (Signed)
  Echocardiogram 2D Echocardiogram has been performed.  Dasean Brow FRANCES 01/05/2013, 10:40 AM

## 2013-01-05 NOTE — ED Notes (Addendum)
Patient maintained O2 Saturations on 2L via [place] for 10 minutes. Wheezing remains. MD informed.

## 2013-01-05 NOTE — ED Notes (Signed)
Per MD Preston Fleeting, patient should remain on [place] in spite of continued wheezing. Monitoring continues.

## 2013-01-06 ENCOUNTER — Inpatient Hospital Stay (HOSPITAL_COMMUNITY): Payer: Medicare Other

## 2013-01-06 ENCOUNTER — Encounter (HOSPITAL_COMMUNITY): Payer: Medicare Other | Admitting: Anesthesiology

## 2013-01-06 ENCOUNTER — Encounter (HOSPITAL_COMMUNITY)
Admission: EM | Disposition: A | Discharge status: Skilled Nursing Facility | Payer: Self-pay | Source: Home / Self Care | Attending: Internal Medicine

## 2013-01-06 ENCOUNTER — Inpatient Hospital Stay (HOSPITAL_COMMUNITY): Payer: Medicare Other | Admitting: Anesthesiology

## 2013-01-06 DIAGNOSIS — N184 Chronic kidney disease, stage 4 (severe): Secondary | ICD-10-CM

## 2013-01-06 DIAGNOSIS — I498 Other specified cardiac arrhythmias: Secondary | ICD-10-CM

## 2013-01-06 DIAGNOSIS — N186 End stage renal disease: Secondary | ICD-10-CM

## 2013-01-06 DIAGNOSIS — I519 Heart disease, unspecified: Secondary | ICD-10-CM

## 2013-01-06 DIAGNOSIS — J9600 Acute respiratory failure, unspecified whether with hypoxia or hypercapnia: Secondary | ICD-10-CM

## 2013-01-06 HISTORY — PX: INSERTION OF DIALYSIS CATHETER: SHX1324

## 2013-01-06 LAB — GLUCOSE, CAPILLARY
Glucose-Capillary: 113 mg/dL — ABNORMAL HIGH (ref 70–99)
Glucose-Capillary: 128 mg/dL — ABNORMAL HIGH (ref 70–99)
Glucose-Capillary: 258 mg/dL — ABNORMAL HIGH (ref 70–99)
Glucose-Capillary: 262 mg/dL — ABNORMAL HIGH (ref 70–99)
Glucose-Capillary: 268 mg/dL — ABNORMAL HIGH (ref 70–99)
Glucose-Capillary: 288 mg/dL — ABNORMAL HIGH (ref 70–99)
Glucose-Capillary: 330 mg/dL — ABNORMAL HIGH (ref 70–99)

## 2013-01-06 LAB — RENAL FUNCTION PANEL
Albumin: 3.7 g/dL (ref 3.5–5.2)
Calcium: 8.3 mg/dL — ABNORMAL LOW (ref 8.4–10.5)
Creatinine, Ser: 5.26 mg/dL — ABNORMAL HIGH (ref 0.50–1.10)
GFR calc Af Amer: 8 mL/min — ABNORMAL LOW (ref >90)
GFR calc non Af Amer: 7 mL/min — ABNORMAL LOW (ref >90)
Potassium: 6 mEq/L — ABNORMAL HIGH (ref 3.5–5.1)

## 2013-01-06 LAB — URINE CULTURE: Colony Count: NO GROWTH

## 2013-01-06 LAB — CBC
Hemoglobin: 10.2 g/dL — ABNORMAL LOW (ref 12.0–15.0)
MCH: 28.2 pg (ref 26.0–34.0)
MCV: 86.2 fL (ref 78.0–100.0)
Platelets: 320 10*3/uL (ref 150–400)
RDW: 15.7 % — ABNORMAL HIGH (ref 11.5–15.5)

## 2013-01-06 SURGERY — INSERTION OF DIALYSIS CATHETER
Anesthesia: Monitor Anesthesia Care | Site: Neck | Wound class: Clean

## 2013-01-06 MED ORDER — FENTANYL CITRATE 0.05 MG/ML IJ SOLN: 50.0000 ug | Freq: Once | INTRAMUSCULAR | Status: DC

## 2013-01-06 MED ORDER — GLYCOPYRROLATE 0.2 MG/ML IJ SOLN
INTRAMUSCULAR | Status: DC | PRN
  Administered 2013-01-06 (×2): 0.2 mg via INTRAVENOUS

## 2013-01-06 MED ORDER — OXYCODONE HCL 5 MG/5ML PO SOLN: 5.0000 mg | Freq: Once | ORAL | Status: DC | PRN

## 2013-01-06 MED ORDER — CEFAZOLIN SODIUM 1-5 GM-% IV SOLN
1.0000 g | INTRAVENOUS | Status: AC
  Administered 2013-01-06: 1 g via INTRAVENOUS
  Filled 2013-01-06 (×2): qty 50

## 2013-01-06 MED ORDER — HYDROCODONE-ACETAMINOPHEN 5-325 MG PO TABS
1.0000 | ORAL_TABLET | Freq: Four times a day (QID) | ORAL | Status: DC | PRN
  Administered 2013-01-06 – 2013-01-07 (×3): 1 via ORAL
  Filled 2013-01-06 (×3): qty 1

## 2013-01-06 MED ORDER — SODIUM BICARBONATE 8.4 % IV SOLN
INTRAVENOUS | Status: AC
  Filled 2013-01-06: qty 50

## 2013-01-06 MED ORDER — HEPARIN SODIUM (PORCINE) 1000 UNIT/ML IJ SOLN
INTRAMUSCULAR | Status: AC
  Filled 2013-01-06: qty 1

## 2013-01-06 MED ORDER — ONDANSETRON HCL 4 MG/2ML IJ SOLN
INTRAMUSCULAR | Status: DC | PRN
  Administered 2013-01-06: 4 mg via INTRAMUSCULAR

## 2013-01-06 MED ORDER — SODIUM CHLORIDE 0.9 % IR SOLN
Status: DC | PRN
  Administered 2013-01-06: 1

## 2013-01-06 MED ORDER — LIDOCAINE-EPINEPHRINE 0.5 %-1:200000 IJ SOLN
INTRAMUSCULAR | Status: AC
  Filled 2013-01-06: qty 1

## 2013-01-06 MED ORDER — SODIUM CHLORIDE 0.9 % IV SOLN
INTRAVENOUS | Status: DC | PRN
  Administered 2013-01-06: 10:00:00 via INTRAVENOUS

## 2013-01-06 MED ORDER — LIDOCAINE-EPINEPHRINE 0.5 %-1:200000 IJ SOLN
INTRAMUSCULAR | Status: DC | PRN
  Administered 2013-01-06: 10 mL

## 2013-01-06 MED ORDER — PROPOFOL 10 MG/ML IV BOLUS
INTRAVENOUS | Status: DC | PRN
  Administered 2013-01-06 (×3): 20 mg via INTRAVENOUS

## 2013-01-06 MED ORDER — HEPARIN SODIUM (PORCINE) 1000 UNIT/ML IJ SOLN
INTRAMUSCULAR | Status: DC | PRN
  Administered 2013-01-06: 4.6 mL via INTRAVENOUS

## 2013-01-06 MED ORDER — LIDOCAINE HCL (CARDIAC) 20 MG/ML IV SOLN
INTRAVENOUS | Status: DC | PRN
  Administered 2013-01-06: 20 mg via INTRAVENOUS

## 2013-01-06 MED ORDER — SODIUM BICARBONATE 8.4 % IV SOLN
50.0000 meq | Freq: Once | INTRAVENOUS | Status: AC
  Administered 2013-01-06: 50 meq via INTRAVENOUS
  Filled 2013-01-06: qty 50

## 2013-01-06 MED ORDER — MIDAZOLAM HCL 2 MG/2ML IJ SOLN: 1.0000 mg | INTRAMUSCULAR | Status: DC | PRN

## 2013-01-06 MED ORDER — INSULIN ASPART 100 UNIT/ML ~~LOC~~ SOLN
SUBCUTANEOUS | Status: DC | PRN
  Administered 2013-01-06: 6 [IU] via SUBCUTANEOUS

## 2013-01-06 MED ORDER — OXYCODONE HCL 5 MG PO TABS: 5.0000 mg | ORAL_TABLET | Freq: Once | ORAL | Status: DC | PRN

## 2013-01-06 MED ORDER — FENTANYL CITRATE 0.05 MG/ML IJ SOLN: 25.0000 ug | INTRAMUSCULAR | Status: DC | PRN

## 2013-01-06 MED ORDER — CALCIUM GLUCONATE 10 % IV SOLN
1.0000 g | Freq: Once | INTRAVENOUS | Status: AC
  Administered 2013-01-06: 1 g via INTRAVENOUS
  Filled 2013-01-06: qty 10

## 2013-01-06 SURGICAL SUPPLY — 45 items
APL SKNCLS STERI-STRIP NONHPOA (GAUZE/BANDAGES/DRESSINGS) ×1
BAG DECANTER FOR FLEXI CONT (MISCELLANEOUS) ×2 IMPLANT
BENZOIN TINCTURE PRP APPL 2/3 (GAUZE/BANDAGES/DRESSINGS) ×1 IMPLANT
CATH CANNON HEMO 15F 50CM (CATHETERS) IMPLANT
CATH CANNON HEMO 15FR 19 (HEMODIALYSIS SUPPLIES) ×1 IMPLANT
CATH CANNON HEMO 15FR 23CM (HEMODIALYSIS SUPPLIES) IMPLANT
CATH CANNON HEMO 15FR 31CM (HEMODIALYSIS SUPPLIES) IMPLANT
CATH CANNON HEMO 15FR 32 (HEMODIALYSIS SUPPLIES) IMPLANT
CATH CANNON HEMO 15FR 32CM (HEMODIALYSIS SUPPLIES) IMPLANT
CLIP LIGATING EXTRA MED SLVR (CLIP) ×2 IMPLANT
CLIP LIGATING EXTRA SM BLUE (MISCELLANEOUS) ×2 IMPLANT
COVER PROBE W GEL 5X96 (DRAPES) IMPLANT
COVER SURGICAL LIGHT HANDLE (MISCELLANEOUS) ×2 IMPLANT
DECANTER SPIKE VIAL GLASS SM (MISCELLANEOUS) ×2 IMPLANT
DRAPE C-ARM 42X72 X-RAY (DRAPES) ×2 IMPLANT
DRAPE CHEST BREAST 15X10 FENES (DRAPES) ×2 IMPLANT
DRSG TEGADERM 4X4.75 (GAUZE/BANDAGES/DRESSINGS) ×1 IMPLANT
GAUZE SPONGE 2X2 8PLY STRL LF (GAUZE/BANDAGES/DRESSINGS) ×1 IMPLANT
GAUZE SPONGE 4X4 16PLY XRAY LF (GAUZE/BANDAGES/DRESSINGS) ×2 IMPLANT
GLOVE SS BIOGEL STRL SZ 7.5 (GLOVE) ×1 IMPLANT
GLOVE SUPERSENSE BIOGEL SZ 7.5 (GLOVE) ×1
GOWN STRL NON-REIN LRG LVL3 (GOWN DISPOSABLE) ×6 IMPLANT
KIT BASIN OR (CUSTOM PROCEDURE TRAY) ×2 IMPLANT
KIT ROOM TURNOVER OR (KITS) ×2 IMPLANT
NDL 18GX1X1/2 (RX/OR ONLY) (NEEDLE) ×1 IMPLANT
NDL HYPO 25GX1X1/2 BEV (NEEDLE) ×1 IMPLANT
NEEDLE 18GX1X1/2 (RX/OR ONLY) (NEEDLE) ×2 IMPLANT
NEEDLE 22X1 1/2 (OR ONLY) (NEEDLE) ×2 IMPLANT
NEEDLE HYPO 25GX1X1/2 BEV (NEEDLE) ×2 IMPLANT
NS IRRIG 1000ML POUR BTL (IV SOLUTION) ×2 IMPLANT
PACK SURGICAL SETUP 50X90 (CUSTOM PROCEDURE TRAY) ×2 IMPLANT
PAD ARMBOARD 7.5X6 YLW CONV (MISCELLANEOUS) ×4 IMPLANT
SOAP 2 % CHG 4 OZ (WOUND CARE) ×2 IMPLANT
SPONGE GAUZE 2X2 STER 10/PKG (GAUZE/BANDAGES/DRESSINGS) ×1
STRIP CLOSURE SKIN 1/2X4 (GAUZE/BANDAGES/DRESSINGS) ×1 IMPLANT
SUT ETHILON 3 0 PS 1 (SUTURE) ×2 IMPLANT
SUT VICRYL 4-0 PS2 18IN ABS (SUTURE) ×2 IMPLANT
SYR 20CC LL (SYRINGE) ×2 IMPLANT
SYR 30ML LL (SYRINGE) IMPLANT
SYR 5ML LL (SYRINGE) ×4 IMPLANT
SYR CONTROL 10ML LL (SYRINGE) ×2 IMPLANT
SYRINGE 10CC LL (SYRINGE) ×2 IMPLANT
TOWEL OR 17X24 6PK STRL BLUE (TOWEL DISPOSABLE) ×2 IMPLANT
TOWEL OR 17X26 10 PK STRL BLUE (TOWEL DISPOSABLE) ×2 IMPLANT
WATER STERILE IRR 1000ML POUR (IV SOLUTION) ×2 IMPLANT

## 2013-01-06 NOTE — Anesthesia Preprocedure Evaluation (Addendum)
Anesthesia Evaluation  Patient identified by MRN, date of birth, ID band Patient awake    Reviewed: Allergy & Precautions, H&P , NPO status , Patient's Chart, lab work & pertinent test results  Airway Mallampati: II TM Distance: <3 FB Neck ROM: Full    Dental   Pulmonary shortness of breath, former smoker,    + decreased breath sounds  rales    Cardiovascular hypertension, + angina + CAD, + Past MI, + Peripheral Vascular Disease and +CHF + dysrhythmias Rhythm:Regular Rate:Bradycardia  bradycardia   Neuro/Psych TIA Neuromuscular disease    GI/Hepatic GERD-  ,  Endo/Other  diabetes, Poorly ControlledHypothyroidism   Renal/GU ARF and CRFRenal disease     Musculoskeletal   Abdominal   Peds  Hematology   Anesthesia Other Findings Glu 288  Reproductive/Obstetrics                          Anesthesia Physical Anesthesia Plan  ASA: IV and emergent  Anesthesia Plan: MAC   Post-op Pain Management:    Induction: Intravenous  Airway Management Planned: Simple Face Mask  Additional Equipment:   Intra-op Plan:   Post-operative Plan:   Informed Consent: I have reviewed the patients History and Physical, chart, labs and discussed the procedure including the risks, benefits and alternatives for the proposed anesthesia with the patient or authorized representative who has indicated his/her understanding and acceptance.     Plan Discussed with: CRNA and Surgeon  Anesthesia Plan Comments:        Anesthesia Quick Evaluation

## 2013-01-06 NOTE — Procedures (Signed)
On HD for hyperkalemia and metabolic acidosis with acute on chronic kidney injury.  BFR 200 UF goal 1-2 L. 1K bath for first hour then 2K.  HR remains in the 40's, SBP 104. Catheter working well so far. Pt comfortable on 2 liters [place]

## 2013-01-06 NOTE — Preoperative (Signed)
Beta Blockers   Reason not to administer Beta Blockers:Not Applicable 

## 2013-01-06 NOTE — Progress Notes (Signed)
Dialysis has been delayed due to need to change adapter on faucet for the HD machine.  Remains quite bradycardic and K much earlier this AM was 6 with bicarb of 16 (gave an amp of bicarb earlier).  Since hope to get this technical issue solved shortly would rather not give kayexelate and run risk of stooling while on HD but WILL give [name] an amp of calcium to stabilize myocardium against effects of hyperkalemia

## 2013-01-06 NOTE — Transfer of Care (Signed)
Immediate Anesthesia Transfer of Care Note  Patient: [name]  Procedure(s) Performed: Procedure(s): INSERTION OF DIALYSIS CATHETER (N/A)  Patient Location: PACU  Anesthesia Type:MAC  Level of Consciousness: awake, alert  and oriented  Airway & Oxygen Therapy: Patient Spontanous Breathing and Patient connected to nasal cannula oxygen  Post-op Assessment: Report given to PACU RN and Post -op Vital signs reviewed and stable  Post vital signs: Reviewed and stable  Complications: No apparent anesthesia complications

## 2013-01-06 NOTE — Progress Notes (Addendum)
Subjective:  More SOB today  Objective:  Vital Signs in the last 24 hours: Temp:  [95.9 F (35.5 C)-98 F (36.7 C)] 98 F (36.7 C) (10/19 0700) Pulse Rate:  [37-50] 47 (10/19 0700) Resp:  [14-22] 17 (10/19 0219) BP: (114-143)/(29-65) 130/55 mmHg (10/19 0700) SpO2:  [90 %-100 %] 100 % (10/19 0300) Weight:  [141 lb 12.1 oz (64.3 kg)] 141 lb 12.1 oz (64.3 kg) (10/19 0223)  Intake/Output from previous day: 10/18 0701 - 10/19 0700 In: 492 [P.O.:360; IV Piggyback:132] Out: 185 [Urine:185] Intake/Output from this shift:   Physical Exam: General appearance: alert, cooperative, moderately obese and ill appearing, but non-toxic.   Neck: no carotid bruit and really cannot see JVP on exam b/c obesity Lungs: diminished breath sounds bibasilar and rales bibasilar Heart: bradycardic with NL S1 S2, no M/R/G Abdomen: protuberant, obese & mildly distended, Non-tender with NABS Extremities: extremities normal, atraumatic, no cyanosis or edema and L BKA Pulses: diminished RLE,poor thrill on LUE fistula (likely non-functional). Neurologic: Mental status: Alert, oriented, thought content appropriate  Lab Results:  Recent Labs  01/04/13 2315 01/06/13 0500  WBC 8.8 7.1  HGB 11.0* 10.2*  PLT 350 320    Recent Labs  01/04/13 2315 01/06/13 0450  NA 137 127*  K 5.3* 6.0*  CL 102 89*  CO2 20 16*  GLUCOSE 192* 308*  BUN 74* 95*  CREATININE 4.18* 5.26*    Recent Labs  01/05/13 0825 01/05/13 1330  TROPONINI <0.30 <0.30   Hepatic Function Panel  Recent Labs  01/04/13 2315 01/06/13 0450  PROT 6.4  --   ALBUMIN 3.4* 3.7  AST 25  --   ALT 18  --   ALKPHOS 99  --   BILITOT 0.2*  --    Imaging: Imaging results have been reviewed  Cardiac Studies:  Assessment/Plan:  Principal Problem:   Acute respiratory failure Active Problems:   CAD - moderate at cath July 2012 (medical Rx)   Myxedema cardiomyopathy, 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  Currently being evaluated by Nephrology & Vasc. Sgx for likely needing HD after [facility] Cath placement.  Clearly has signs of worsening volume overload on exam with worsening pulmonary rales.  As d/w Nephrology - plan is HD today, hopefully with volume removal & restoration of Acid/Base status, she will improve.  Her EF is essentially normal with diastolic dysfunction - clearly with pulmonary edema on exam, has A on C Diastolic HF.  She is currently in a stable narrow complex Junctional escape rhythm, that we would expect to improve as the K+ & acidosis improve. We are watching her HR - hopefully K+ removal will help improve HR.  -- will place Temp Pacing pads prophylactically for OR & HD.  It would be prudent to keep her in ICU level care for initial run of HD as a precaution.    She is off all AV Nodal agents.    LOS: 2 days  HARDING,DAVID W 01/06/2013, 8:18 AM

## 2013-01-06 NOTE — Progress Notes (Signed)
[facility] PROGRESS NOTE  [name] ZOX:[id number] DOB: 11-17-1923 DOA: 01/04/2013 PCP: Georgianne Fick, MD  Assessment/Plan: AKI on CKD IV- possibly due to exacerbation in CHF as result of diuretic dose reduction; appreciate nephrology and cariology.   Not responding to max dose of lasix.  -labs today shoe hyperkalemia and continued worsening renal failure.  -AVF not usable -Dr. Arbie Cookey will place [facility] in the OR  -Dialysis afterward  DM -SSI -may need low dose basal insulin after procedure  Bradycardia -holding beta blocker -cards consult  CHF - As above. Per notes cardiac issues in the past attributed to myxedema. More recent notes by Dr. Rennis Golden indicate EF had returned to normal. In 01/2012 EF was 55% Repeat echo EF 65-70%  Hypothyroidism - Prior h/o myxedema.TSH 4.2  PAD - stable  Hyperkalemia - dialysis  Anemia - Hb >10 -iron panel pending     Code Status: full Family Communication: patient Disposition Plan: sdu   Consultants:  Vascular  Cards  nephro  Procedures:    Antibiotics:    HPI/Subjective: Worried about procedure  Objective: Filed Vitals:   01/06/13 0700  BP: 130/55  Pulse: 47  Temp: 98 F (36.7 C)  Resp:     Intake/Output Summary (Last 24 hours) at 01/06/13 0927 Last data filed at 01/06/13 0400  Gross per 24 hour  Intake    492 ml  Output    185 ml  Net    307 ml   Filed Weights   01/05/13 0200 01/06/13 0223  Weight: 63.2 kg (139 lb 5.3 oz) 64.3 kg (141 lb 12.1 oz)    Exam:  General appearance: alert, ill appearing, but non-toxic.  Neck: no carotid bruit   Lungs: diminished breath sounds bibasilar and rales bibasilar  Heart: bradycardic  Abdomen: protuberant, obese & mildly distended,   Extremities: extremities normal, atraumatic, no cyanosis or edema and L BKA  Neurologic: Mental status: Alert, oriented, thought content appropriate    Data Reviewed: Basic Metabolic Panel:  Recent Labs Lab  01/01/13 0951 01/04/13 2315 01/06/13 0450  NA 137 137 127*  K 4.3 5.3* 6.0*  CL 103 102 89*  CO2 24 20 16*  GLUCOSE 132* 192* 308*  BUN 57* 74* 95*  CREATININE 2.70* 4.18* 5.26*  CALCIUM 9.1 8.3* 8.3*  PHOS  --   --  6.0*   Liver Function Tests:  Recent Labs Lab 01/04/13 2315 01/06/13 0450  AST 25  --   ALT 18  --   ALKPHOS 99  --   BILITOT 0.2*  --   PROT 6.4  --   ALBUMIN 3.4* 3.7   No results found for this basename: LIPASE, AMYLASE,  in the last 168 hours No results found for this basename: AMMONIA,  in the last 168 hours CBC:  Recent Labs Lab 01/04/13 2315 01/06/13 0500  WBC 8.8 7.1  NEUTROABS 5.8  --   HGB 11.0* 10.2*  HCT 33.4* 31.2*  MCV 87.0 86.2  PLT 350 320   Cardiac Enzymes:  Recent Labs Lab 01/04/13 2315 01/05/13 0130 01/05/13 0825 01/05/13 1330  TROPONINI <0.30 <0.30 <0.30 <0.30   BNP (last 3 results)  Recent Labs  01/04/13 2315  PROBNP 16572.0*   CBG:  Recent Labs Lab 01/05/13 1226 01/05/13 1619 01/05/13 2020 01/06/13 0045 01/06/13 0332  GLUCAP 373* 289* 282* 330* 288*    Recent Results (from the past 240 hour(s))  MRSA PCR SCREENING     Status: None   Collection Time  01/05/13  2:02 AM      Result Value Range Status   MRSA by PCR NEGATIVE  NEGATIVE Final   Comment:            The GeneXpert MRSA Assay (FDA     approved for NASAL specimens     only), is one component of a     comprehensive MRSA colonization     surveillance program. It is not     intended to diagnose MRSA     infection nor to guide or     monitor treatment for     MRSA infections.     Studies: Dg Chest Port 1 View  01/05/2013   CLINICAL DATA:  Shortness of breath  EXAM: PORTABLE CHEST - 1 VIEW  COMPARISON:  02/06/2012  FINDINGS: Cardiopericardial enlargement. Rightward deviation of the trachea at the level of the aortic knob, accentuated from priors by portable technique. A similar appearance seen 10/07/2010. Remaining upper mediastinal  contours stable.  Indistinct, streaky opacities in the lower lungs, left more than right. No overt edema. Possible small effusions. No pneumothorax.  IMPRESSION: Lower lung interstitial opacities which may represent atelectasis, pneumonitis, or infection.   Electronically Signed   By: Tiburcio Pea M.D.   On: 01/05/2013 00:13    Scheduled Meds: . allopurinol  100 mg Oral Daily  .  ceFAZolin (ANCEF) IV  1 g Intravenous On Call to OR  . clopidogrel  75 mg Oral Daily  . enoxaparin (LOVENOX) injection  30 mg Subcutaneous Daily  . gabapentin  300 mg Oral BID  . insulin aspart  0-9 Units Subcutaneous Q4H  . isosorbide mononitrate  60 mg Oral Daily  . levothyroxine  75 mcg Oral QAC breakfast  . pneumococcal 23 valent vaccine  0.5 mL Intramuscular Tomorrow-1000  . simvastatin  20 mg Oral q1800  . sodium bicarbonate  50 mEq Intravenous Once  . sodium chloride  3 mL Intravenous Q12H  . Travoprost (BAK Free)  1 drop Both Eyes QHS   Continuous Infusions: . 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 If 7PM-7AM, please contact night-coverage at [email or web address], password [facility] 01/06/2013, 9:27 AM  LOS: 2 days

## 2013-01-06 NOTE — Op Note (Signed)
OPERATIVE REPORT  DATE OF SURGERY: 01/06/2013  PATIENT: [name], 79 y.o. female MRN: [id number]  DOB: 1923/08/23  PRE-OPERATIVE DIAGNOSIS: End-stage renal disease  POST-OPERATIVE DIAGNOSIS:  Same  PROCEDURE: Right IJ hemodialysis catheter placement   SURGEON:  Gretta Began, M.D.  PHYSICIAN ASSISTANT: Nurse  ANESTHESIA:  Local with sedation  EBL: Minimal ml     BLOOD ADMINISTERED: None  DRAINS: None  SPECIMEN: None  COUNTS CORRECT:  YES  PLAN OF CARE: PACU with chest x-ray pending   PATIENT DISPOSITION:  PACU - hemodynamically stable  PROCEDURE DETAILS: The patient was taken up replacing that is where the area of the right and left neck were prepped and draped in usual sterile fashion. Ultrasound imaging revealed widely patent right internal jugular vein. Using guidance with ultrasound the right internal jugular vein was accessed with an 18-gauge needle. Guidewire was passed down to the level right atrium this was confirmed with fluoroscopy. A dilator and peel-away sheath were placed over the guidewire and the dilator and guidewire removed. A 23 cm hemodialysis catheter was passed through the peel-away sheath and the peel-away sheath was removed. The catheter tips were positioned level the distal right atrium. The catheter was brought through a subcutaneous tunnel through a separate stab incision using local anesthesia. A 2 lm ports were attached. Both lumens flushed and aspirated easily reluctant without unit per cc heparin. The catheter secured to the skin with 3-0 nylon stitch and the resectoscope with a 4-0 subcuticular Vicryl stitch. Sterile dressing was applied the patient was taken to the recovery room in stable condition   Gretta Began, M.D. 01/06/2013 10:44 AM

## 2013-01-06 NOTE — Progress Notes (Addendum)
Subjective:  Remains somewhat dyspneic Creatinine still rising On max lasix with not much UOP Potassium up now  Objective Vital signs in last 24 hours: Filed Vitals:   01/06/13 0219 01/06/13 0223 01/06/13 0300 01/06/13 0700  BP: 119/29  133/51 130/55  Pulse: 37  43 47  Temp:   97.6 F (36.4 C) 98 F (36.7 C)  TempSrc:   Oral Oral  Resp: 17     Height:      Weight:  64.3 kg (141 lb 12.1 oz)    SpO2: 98%  100%    Weight change: 1.1 kg (2 lb 6.8 oz)  Intake/Output Summary (Last 24 hours) at 01/06/13 0806 Last data filed at 01/06/13 0400  Gross per 24 hour  Intake    492 ml  Output    185 ml  Net    307 ml   Physical Exam:  Blood pressure 130/55, pulse 47, temperature 98 F (36.7 C), temperature source Oral, resp. rate 17, height 4\' 11"  (1.499 m), weight 64.3 kg (141 lb 12.1 oz), SpO2 100.00%. Crackles lung bases Bradycardic in the 40's Abd protuberant NT Minimal LE edema/BKA  Labs: Basic Metabolic Panel:  Recent Labs Lab 01/01/13 0951 01/04/13 2315 01/06/13 0450  NA 137 137 127*  K 4.3 5.3* 6.0*  CL 103 102 89*  CO2 24 20 16*  GLUCOSE 132* 192* 308*  BUN 57* 74* 95*  CREATININE 2.70* 4.18* 5.26*  CALCIUM 9.1 8.3* 8.3*  PHOS  --   --  6.0*   Liver Function Tests:  Recent Labs Lab 01/04/13 2315 01/06/13 0450  AST 25  --   ALT 18  --   ALKPHOS 99  --   BILITOT 0.2*  --   PROT 6.4  --   ALBUMIN 3.4* 3.7  CBC:  Recent Labs Lab 01/04/13 2315 01/06/13 0500  WBC 8.8 7.1  NEUTROABS 5.8  --   HGB 11.0* 10.2*  HCT 33.4* 31.2*  MCV 87.0 86.2  PLT 350 320   Recent Labs Lab 01/04/13 2315 01/05/13 0130 01/05/13 0825 01/05/13 1330  TROPONINI <0.30 <0.30 <0.30 <0.30   CBG:  Recent Labs Lab 01/05/13 1226 01/05/13 1619 01/05/13 2020 01/06/13 0045 01/06/13 0332  GLUCAP 373* 289* 282* 330* 288*    Iron Studies:  Recent Labs Lab 01/05/13 1330  IRON 25*  TIBC 146*  FERRITIN 112   Studies/Results: Dg Chest Port 1 View  01/05/2013    CLINICAL DATA:  Shortness of breath  EXAM: PORTABLE CHEST - 1 VIEW  COMPARISON:  02/06/2012  FINDINGS: Cardiopericardial enlargement. Rightward deviation of the trachea at the level of the aortic knob, accentuated from priors by portable technique. A similar appearance seen 10/07/2010. Remaining upper mediastinal contours stable.  Indistinct, streaky opacities in the lower lungs, left more than right. No overt edema. Possible small effusions. No pneumothorax.  IMPRESSION: Lower lung interstitial opacities which may represent atelectasis, pneumonitis, or infection.   Electronically Signed   By: Tiburcio Pea M.D.   On: 01/05/2013 00:13   Medications: . albuterol Stopped (01/05/13 0044)   . allopurinol  100 mg Oral Daily  . clopidogrel  75 mg Oral Daily  . enoxaparin (LOVENOX) injection  30 mg Subcutaneous Daily  . furosemide  160 mg Intravenous Q6H  . gabapentin  300 mg Oral BID  . insulin aspart  0-9 Units Subcutaneous Q4H  . isosorbide mononitrate  60 mg Oral Daily  . levothyroxine  75 mcg Oral QAC breakfast  . pneumococcal 23 valent  vaccine  0.5 mL Intramuscular Tomorrow-1000  . simvastatin  20 mg Oral q1800  . sodium chloride  3 mL Intravenous Q12H  . Travoprost (BAK Free)  1 drop Both Eyes QHS    I  have reviewed scheduled and prn medications.   Impression/Plan   79 yo AAF with background of cardiomyopathy, moderate CAD, diastolic CHF, IDDM, PAD with H/O BKA, hypothyroidism.  Known CKD4  (followed by Dr. Darrick Penna; left basilic transposition fistula created 2013) presented with exacerbation of diastolic CHF temporally related to a reduction in diuretics (because of a creatinine of 2.7 on 10/14), and worsening AKI on CKD with admission creatinine of 4.18 and oligoanuria   1. AKI on CKD4 - possibly due to exacerbation in CHF as result of diuretic dose reduction No response to max dose of lasix. Now hyperkalemic and with worsening acidosis and continued worsening renal failure.  AVF  marginal (pulsatile) and not usable. Dr. Arbie Cookey will place [facility] in the OR  Will give an amp of bicarb, Dr. Arbie Cookey states OK for OR without kayexelate.  Dialysis afterward 2. CHF/CM - As above. Per notes cardiac issues in the past attributed to myxedema. More recent notes by Dr. Rennis Golden indicate EF had returned to normal. In 01/2012 EF was 55% Repeat echo this admission EF 65-70%  Remains bradycardic in the 40's off BB (hyperkalemia not helping that situation today) 3. Hypothyroidism - Prio h/o myxedema.TSH 4.2 4. PAD - stable 5. Hyperkalemia - dialysis 6. Metabolic acidosis - dialysis; 1 am bicarb pending catheter placement and HD) 7. Anemia - Hb >10 No ESA's indicated as of yet but will check iron studies 8. CKD-MBD - PTH pending Phos up start binders when taking po again 9. Non-functional left basilic transposition AVF - Dr. Arbie Cookey aware. Evaluate when medically more stable  Camille Bal, MD [facility] [phone] pager 01/06/2013, 8:06 AM

## 2013-01-06 NOTE — Anesthesia Postprocedure Evaluation (Signed)
  Anesthesia Post-op Note  Patient: [name]  Procedure(s) Performed: Procedure(s): INSERTION OF DIALYSIS CATHETER (N/A)  Patient Location: PACU  Anesthesia Type:MAC  Level of Consciousness: awake  Airway and Oxygen Therapy: Patient Spontanous Breathing  Post-op Pain: mild  Post-op Assessment: Post-op Vital signs reviewed, Patient's Cardiovascular Status Stable, Respiratory Function Stable, Patent Airway, No signs of Nausea or vomiting and Pain level controlled  Post-op Vital Signs: Reviewed and stable  Complications: No apparent anesthesia complications

## 2013-01-06 NOTE — Progress Notes (Signed)
Md aware of 15 beat run of V-tach. No new orders given at present. Will continue to monitor. Patient stable.   Ms. Hillery Hunter RN

## 2013-01-06 NOTE — Consult Note (Signed)
79 year old female with progressive chronic renal insufficiency. Has had a left upper arm basilic vein transposition with Dr. Darrick Penna. Is admitted now with a rising potassium and congestive heart failure. Passed to see to determine if the fistula is acceptable.  Past Medical History  Diagnosis Date  . Coronary artery disease   . TIA (transient ischemic attack)   . Hypertension   . Shingles   . Stroke   . Anemia   . CAD (coronary artery disease) 02/08/2011  . Cardiomyopathy, idiopathic 02/08/2011  . Chronic kidney disease   . Angina     Takes Isosorbide  . Unstable angina 02/08/2011  . Myocardial infarct     x 3 unsure of years  . Irregular heartbeat   . Ulcer   . GERD (gastroesophageal reflux disease)     takes Protonix and zantac  . Hx of transient ischemic attack (TIA)   . Memory loss   . CHF (congestive heart failure)     Takes Lasix  . Gout     takes allopurinol  . Peripheral vascular disease     left lower  leg  . Diabetes mellitus     type 2 NIDDM x 2 years; no meds  . Chronic kidney disease (CKD), stage IV (severe)   . Arthritis   . Shortness of breath   . Thyroid disease   . Hypothyroidism     (SEVERE) Takes Levothryroxine  . Hypothyroid 02/08/2011  . Nonischemic cardiomyopathy     EF now is 55%, reduced due to myxedema, which is improved.  . Dyslipidemia   . Peripheral neuropathy   . H/O echocardiogram 09/06/11    Indication- nonIschemic Cardiomyopathy. EF = now greater than 55% with no regional wall motion abnormalities. Tthere is mild to moderate trisuspid regurgitayion and mild pulmonary hypertension with an RVSP of 35 mmHg as well as stage 1 diastolic dysfunction and mild to moderate LVH.  Marland Kitchen Abnormal nuclear stress test 06/01/09    Demonstrated a new area of infarct scar, peri-infarct ischemia seen in the inferolateral territory. EF eas normal at 70% with mild hypocontractility at the apex, distal inferolateral wall.    History  Substance Use Topics  .  Smoking status: Former Smoker -- 0.50 packs/day for 40 years    Quit date: 07/05/1986  . Smokeless tobacco: Never Used     Comment: quit smoking 1988  . Alcohol Use: No    Family History  Problem Relation Age of Onset  . Cancer Sister     STOMACH  . Diabetes Sister   . Cancer Brother     BONE  . Diabetes Brother   . Anesthesia problems Neg Hx   . Hypotension Neg Hx   . Malignant hyperthermia Neg Hx   . Pseudochol deficiency Neg Hx   . Hyperlipidemia Daughter   . Hypertension Daughter   . Heart disease Daughter     before age 1  . Kidney disease Daughter   . Heart attack Daughter   . Other Daughter     varicose veins  . Diabetes Daughter   . Heart disease Son     before age 84  . Hyperlipidemia Son   . Hypertension Son   . Heart attack Son     Allergies  Allergen Reactions  . Aspirin Nausea And Vomiting    325 mg (adult strength) [name]     Current facility-administered medications:0.9 %  sodium chloride infusion, 250 mL,  Intravenous, PRN, Ron Parker, MD;  acetaminophen (TYLENOL) tablet 650 mg, 650 mg, Oral, Q4H PRN, Ron Parker, MD, 650 mg at 01/05/13 1414;  albuterol (PROVENTIL,VENTOLIN) solution continuous neb, 10 mg/hr, Nebulization, Continuous, Loren Racer, MD, 10 mg/hr at 01/04/13 2325 allopurinol (ZYLOPRIM) tablet 100 mg, 100 mg, Oral, Daily, Ron Parker, MD, 100 mg at 01/05/13 1043;  clopidogrel (PLAVIX) tablet 75 mg, 75 mg, Oral, Daily, Ron Parker, MD, 75 mg at 01/05/13 0759;  enoxaparin (LOVENOX) injection 30 mg, 30 mg, Subcutaneous, Daily, Ron Parker, MD, 30 mg at 01/05/13 1042;  gabapentin (NEURONTIN) capsule 300 mg, 300 mg, Oral, BID, Ron Parker, MD, 300 mg at 01/05/13 2141 insulin aspart (novoLOG) injection 0-9 Units, 0-9 Units, Subcutaneous, Q4H, Ron Parker, MD, 5 Units at 01/06/13 0352;  isosorbide mononitrate (IMDUR) 24 hr tablet 60 mg,  60 mg, Oral, Daily, Ron Parker, MD, 60 mg at 01/05/13 1043;  levothyroxine (SYNTHROID, LEVOTHROID) tablet 75 mcg, 75 mcg, Oral, QAC breakfast, Ron Parker, MD, 75 mcg at 01/05/13 0759 nitroGLYCERIN (NITROSTAT) SL tablet 0.4 mg, 0.4 mg, Sublingual, Q5 min PRN, Ron Parker, MD;  ondansetron (ZOFRAN) injection 4 mg, 4 mg, Intravenous, Q6H PRN, Ron Parker, MD;  pneumococcal 23 valent vaccine (PNU-IMMUNE) injection 0.5 mL, 0.5 mL, Intramuscular, Tomorrow-1000, Ron Parker, MD;  simvastatin (ZOCOR) tablet 20 mg, 20 mg, Oral, q1800, Ron Parker, MD, 20 mg at 01/05/13 1714 sodium bicarbonate injection 50 mEq, 50 mEq, Intravenous, Once, Sadie Haber, MD;  sodium chloride 0.9 % injection 3 mL, 3 mL, Intravenous, Q12H, Ron Parker, MD, 3 mL at 01/05/13 2035;  sodium chloride 0.9 % injection 3 mL, 3 mL, Intravenous, PRN, Ron Parker, MD;  Travoprost (BAK Free) (TRAVATAN) 0.004 % ophthalmic solution SOLN 1 drop, 1 drop, Both Eyes, QHS, Harvette C Jenkins, MD, 1 drop at 01/05/13 2200  BP 130/55  Pulse 47  Temp(Src) 98 F (36.7 C) (Oral)  Resp 17  Ht 4\' 11"  (1.499 m)  Wt 141 lb 12.1 oz (64.3 kg)  BMI 28.62 kg/m2  SpO2 100%  Body mass index is 28.62 kg/(m^2).   Review of systems reviewed with nothing to add other than the history and physical.  Physical exam obese black female with some shortness of breath. Her skin is without ulcers or rashes. She is alert and oriented. Neurologically grossly intact. Her left upper arm fistula has a pulse but no bruit. She has small caliber feels to be a proximally 4 mm by physical exam.  Impression and plan end-stage renal disease with volume overload and hyperkalemia in need of emergent hemodialysis. Discuss this with the patient. I do not feel that her basilic vein transposition is acceptable for hemodialysis. Will need urgent tunneled catheter placed today. Discuss this with the patient who understands the  procedure. For urgent catheter today

## 2013-01-07 ENCOUNTER — Encounter (HOSPITAL_COMMUNITY): Payer: Self-pay | Admitting: Vascular Surgery

## 2013-01-07 DIAGNOSIS — E1159 Type 2 diabetes mellitus with other circulatory complications: Secondary | ICD-10-CM

## 2013-01-07 LAB — CBC
HCT: 28 % — ABNORMAL LOW (ref 36.0–46.0)
Hemoglobin: 9.3 g/dL — ABNORMAL LOW (ref 12.0–15.0)
MCH: 28.5 pg (ref 26.0–34.0)
MCHC: 33.2 g/dL (ref 30.0–36.0)
MCV: 85.9 fL (ref 78.0–100.0)
RBC: 3.26 MIL/uL — ABNORMAL LOW (ref 3.87–5.11)

## 2013-01-07 LAB — PARATHYROID HORMONE, INTACT (NO CA): PTH: 273.4 pg/mL — ABNORMAL HIGH (ref 14.0–72.0)

## 2013-01-07 LAB — RENAL FUNCTION PANEL
BUN: 60 mg/dL — ABNORMAL HIGH (ref 6–23)
CO2: 23 mEq/L (ref 19–32)
Calcium: 8 mg/dL — ABNORMAL LOW (ref 8.4–10.5)
Chloride: 91 mEq/L — ABNORMAL LOW (ref 96–112)
Creatinine, Ser: 3.53 mg/dL — ABNORMAL HIGH (ref 0.50–1.10)
GFR calc Af Amer: 12 mL/min — ABNORMAL LOW (ref >90)
Glucose, Bld: 189 mg/dL — ABNORMAL HIGH (ref 70–99)
Potassium: 4.7 mEq/L (ref 3.5–5.1)

## 2013-01-07 LAB — GLUCOSE, CAPILLARY
Glucose-Capillary: 173 mg/dL — ABNORMAL HIGH (ref 70–99)
Glucose-Capillary: 160 mg/dL — ABNORMAL HIGH (ref 70–99)
Glucose-Capillary: 182 mg/dL — ABNORMAL HIGH (ref 70–99)

## 2013-01-07 LAB — HEPATITIS B CORE ANTIBODY, TOTAL: Hep B Core Total Ab: REACTIVE — AB

## 2013-01-07 MED ORDER — SODIUM CHLORIDE 0.9 % IV SOLN
125.0000 mg | INTRAVENOUS | Status: DC
  Administered 2013-01-07: 125 mg via INTRAVENOUS
  Filled 2013-01-07 (×5): qty 10

## 2013-01-07 MED ORDER — DARBEPOETIN ALFA-POLYSORBATE 60 MCG/0.3ML IJ SOLN
60.0000 ug | INTRAMUSCULAR | Status: DC
  Administered 2013-01-07: 60 ug via INTRAVENOUS
  Filled 2013-01-07: qty 0.3

## 2013-01-07 MED ORDER — LEVALBUTEROL HCL 0.63 MG/3ML IN NEBU
0.6300 mg | INHALATION_SOLUTION | Freq: Four times a day (QID) | RESPIRATORY_TRACT | Status: DC | PRN
Start: 2013-01-07 — End: 2013-01-16
  Filled 2013-01-07: qty 3

## 2013-01-07 MED ORDER — SODIUM CHLORIDE 0.9 % IV SOLN: 125.0000 mg | Freq: Once | INTRAVENOUS | Status: DC

## 2013-01-07 MED ORDER — SODIUM CHLORIDE 0.9 % IV SOLN: 100.0000 mL | INTRAVENOUS | Status: DC | PRN

## 2013-01-07 MED ORDER — HYDROMORPHONE HCL PF 1 MG/ML IJ SOLN: 1.0000 mg | INTRAMUSCULAR | Status: DC | PRN

## 2013-01-07 MED ORDER — LIDOCAINE-PRILOCAINE 2.5-2.5 % EX CREA
1.0000 "application " | TOPICAL_CREAM | CUTANEOUS | Status: DC | PRN
  Filled 2013-01-07: qty 5

## 2013-01-07 MED ORDER — CALCIUM ACETATE 667 MG PO CAPS
667.0000 mg | ORAL_CAPSULE | Freq: Three times a day (TID) | ORAL | Status: DC
  Administered 2013-01-07 – 2013-01-16 (×23): 667 mg via ORAL
  Filled 2013-01-07 (×31): qty 1

## 2013-01-07 MED ORDER — ASPIRIN EC 81 MG PO TBEC
81.0000 mg | DELAYED_RELEASE_TABLET | Freq: Every day | ORAL | Status: DC
  Administered 2013-01-07 – 2013-01-16 (×10): 81 mg via ORAL
  Filled 2013-01-07 (×10): qty 1

## 2013-01-07 MED ORDER — SODIUM CHLORIDE 0.9 % IV SOLN
20.0000 ug | Freq: Once | INTRAVENOUS | Status: AC
  Administered 2013-01-07: 20 ug via INTRAVENOUS
  Filled 2013-01-07: qty 5

## 2013-01-07 MED ORDER — DARBEPOETIN ALFA-POLYSORBATE 60 MCG/0.3ML IJ SOLN
INTRAMUSCULAR | Status: AC
  Filled 2013-01-07: qty 0.3

## 2013-01-07 MED ORDER — HEPARIN SODIUM (PORCINE) 1000 UNIT/ML DIALYSIS: 20.0000 [IU]/kg | INTRAMUSCULAR | Status: DC | PRN

## 2013-01-07 MED ORDER — LIDOCAINE HCL (PF) 1 % IJ SOLN: 5.0000 mL | INTRAMUSCULAR | Status: DC | PRN

## 2013-01-07 MED ORDER — ENOXAPARIN SODIUM 30 MG/0.3ML ~~LOC~~ SOLN
30.0000 mg | Freq: Every day | SUBCUTANEOUS | Status: DC
  Administered 2013-01-08 – 2013-01-16 (×9): 30 mg via SUBCUTANEOUS
  Filled 2013-01-07 (×9): qty 0.3

## 2013-01-07 MED ORDER — PENTAFLUOROPROP-TETRAFLUOROETH EX AERO: 1.0000 "application " | INHALATION_SPRAY | CUTANEOUS | Status: DC | PRN

## 2013-01-07 MED ORDER — HEPARIN SODIUM (PORCINE) 1000 UNIT/ML DIALYSIS: 1000.0000 [IU] | INTRAMUSCULAR | Status: DC | PRN

## 2013-01-07 MED ORDER — CARVEDILOL 3.125 MG PO TABS
3.1250 mg | ORAL_TABLET | Freq: Two times a day (BID) | ORAL | Status: DC
  Administered 2013-01-07 – 2013-01-08 (×2): 3.125 mg via ORAL
  Filled 2013-01-07 (×4): qty 1

## 2013-01-07 MED ORDER — ALTEPLASE 2 MG IJ SOLR
2.0000 mg | Freq: Once | INTRAMUSCULAR | Status: DC | PRN
  Filled 2013-01-07: qty 2

## 2013-01-07 MED ORDER — RENA-VITE PO TABS
1.0000 | ORAL_TABLET | Freq: Every day | ORAL | Status: DC
  Administered 2013-01-07 – 2013-01-15 (×9): 1 via ORAL
  Filled 2013-01-07 (×12): qty 1

## 2013-01-07 MED ORDER — NEPRO/CARBSTEADY PO LIQD
237.0000 mL | ORAL | Status: DC | PRN
  Filled 2013-01-07: qty 237

## 2013-01-07 NOTE — Care Management Note (Signed)
    Page 1 of 1   01/07/2013     8:26:44 AM   CARE MANAGEMENT NOTE 01/07/2013  Patient:  [name], [name]   Account Number:  [id number]  Date Initiated:  01/07/2013  Documentation initiated by:  Junius Creamer  Subjective/Objective Assessment:   adm w resp failure, heart failure     Action/Plan:   lives w fam, pcp dr Nicholos Johns   Anticipated DC Date:     Anticipated DC Plan:        DC Planning Services  CM consult      Choice offered to / List presented to:             Status of service:   Medicare Important Message given?   (If response is "NO", the following Medicare IM given date fields will be blank) Date Medicare IM given:   Date Additional Medicare IM given:    Discharge Disposition:    Per UR Regulation:  Reviewed for med. necessity/level of care/duration of stay  If discussed at Long Length of Stay Meetings, dates discussed:    Comments:  10/20 0825 debbie Debara Kamphuis rn,bsn will moniter for dc needs as pt progresses.

## 2013-01-07 NOTE — Progress Notes (Signed)
Subjective:  Less SOB. Bleeding from dialysis catheter site overnight.  Objective:  Vital Signs in the last 24 hours: Temp:  [97.4 F (36.3 C)-97.9 F (36.6 C)] 97.8 F (36.6 C) (10/20 0733) Pulse Rate:  [49-68] 68 (10/20 0733) Resp:  [10-24] 13 (10/20 0526) BP: (106-160)/(32-104) 148/65 mmHg (10/20 0733) SpO2:  [96 %-100 %] 100 % (10/20 0733)  Intake/Output from previous day:  Intake/Output Summary (Last 24 hours) at 01/07/13 0846 Last data filed at 01/07/13 0800  Gross per 24 hour  Intake    400 ml  Output   2309 ml  Net  -1909 ml    Physical Exam: General appearance: alert, cooperative, no distress and moderately obese Lungs: decreased breath sounds  Heart: regular rate and rhythm   Rate: 68  Rhythm: normal sinus rhythm  Lab Results:  Recent Labs  01/04/13 2315 01/06/13 0500  WBC 8.8 7.1  HGB 11.0* 10.2*  PLT 350 320    Recent Labs  01/06/13 0450 01/07/13 0412  NA 127* 130*  K 6.0* 4.7  CL 89* 91*  CO2 16* 23  GLUCOSE 308* 189*  BUN 95* 60*  CREATININE 5.26* 3.53*    Recent Labs  01/05/13 0825 01/05/13 1330  TROPONINI <0.30 <0.30   No results found for this basename: INR,  in the last 72 hours  Imaging: Imaging results have been reviewed  Cardiac Studies:  Assessment/Plan:   Principal Problem:   Acute respiratory failure Active Problems:   Acute on chronic diastolic CHF    Myxedema cardiomyopathy, 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 hold Lovenox today, resume in am. It should be OK to resume Coreg at a lower dose and observe on telemetry now that K+ corrected. She was on Norvasc prior to admission, will follow B/P and resume if needed. Continue Imdur for known moderate CAD.   Corine Shelter PA-C Beeper [phone] 01/07/2013, 8:46 AM

## 2013-01-07 NOTE — Progress Notes (Signed)
Patient ID: [name], female   DOB: Nov 30, 1923, 79 y.o.   MRN: [id number]  [facility] Progress Note    Subjective:   Had some pain and bleeding at site of RIJ PC.  Reports improved breathing.   Objective:   BP 148/65  Pulse 68  Temp(Src) 97.8 F (36.6 C) (Oral)  Resp 13  Ht 4\' 11"  (1.499 m)  Wt 64.3 kg (141 lb 12.1 oz)  BMI 28.62 kg/m2  SpO2 100%  Intake/Output: I/O last 3 completed shifts: In: 532 [P.O.:100; I.V.:250; IV Piggyback:182] Out: 1974 [Urine:905; Other:1069]   Intake/Output this shift:  Total I/O In: -  Out: 400 [Urine:400] Weight change:   Physical Exam: Gen:WD elderly AAF in NAD CVS:RRR no rub Resp:bibasilar crackles NWG:NFAOZH Ext:s/p LBKA, LUE AVF pulsatile, no bruit/thrill.  Labs: BMET  Recent Labs Lab 01/01/13 0951 01/04/13 2315 01/06/13 0450 01/07/13 0412  NA 137 137 127* 130*  K 4.3 5.3* 6.0* 4.7  CL 103 102 89* 91*  CO2 24 20 16* 23  GLUCOSE 132* 192* 308* 189*  BUN 57* 74* 95* 60*  CREATININE 2.70* 4.18* 5.26* 3.53*  ALBUMIN  --  3.4* 3.7 3.6  CALCIUM 9.1 8.3* 8.3* 8.0*  PHOS  --   --  6.0* 5.7*   CBC  Recent Labs Lab 01/04/13 2315 01/06/13 0500  WBC 8.8 7.1  NEUTROABS 5.8  --   HGB 11.0* 10.2*  HCT 33.4* 31.2*  MCV 87.0 86.2  PLT 350 320    @IMGRELPRIORS @ Medications:    . allopurinol  100 mg Oral Daily  . aspirin EC  81 mg Oral Daily  . carvedilol  3.125 mg Oral BID WC  . [START ON 01/08/2013] enoxaparin (LOVENOX) injection  30 mg Subcutaneous Daily  . gabapentin  300 mg Oral BID  . insulin aspart  0-9 Units Subcutaneous Q4H  . isosorbide mononitrate  60 mg Oral Daily  . levothyroxine  75 mcg Oral QAC breakfast  . pneumococcal 23 valent vaccine  0.5 mL Intramuscular Tomorrow-1000  . simvastatin  20 mg Oral q1800  . sodium chloride  3 mL Intravenous Q12H  . Travoprost (BAK Free)  1 drop Both Eyes QHS    Assessment/ Plan:   1. CHF/CMP- improving with HD and UF.  HR has  improved. 2. AKI/CKD stage 4, possibly now ESRD due to cardiorenal syndrome: cont with IHD and follow UOP and daily Scr.  Still with some volume so will plan for HD again today as this may also help with uremic platelet dysfunction. 3. Hyperkalemia:  Improved post HD 4. Metabolic acidosis- improved post HD 5. Anemia: will start ESA.   6. CKD-MBD: improving.  Start binders and follow 7. Hypothyroidism- prior h/o myxedema.  TSH stable 8. Nutrition: improving 9. Hypertension:stable 10. Vascular access- new RIJ PC.  Dr. Arbie Cookey evaluated this as it was bleeding but has since stopped.  [name] A 01/07/2013, 9:04 AM

## 2013-01-07 NOTE — Progress Notes (Signed)
Patient ID: [name], female   DOB: Aug 03, 1923, 79 y.o.   MRN: [id number] The patient successfully dialyzed via her right IJ catheter yesterday. She did have some oozing from her catheter site. She is on Plavix and Lovenox which has been held. I did down off her dressings and she has no bleeding at the insertion site or the catheter currently. At this was redressed.  Regarding permanent access. I do not feel that her basilic vein transposition fistula on the left will function. I would recommend a prosthetic Gore-Tex graft when she is more stable. We will not follow actively. Please reconsult if she is a candidate for left arm AV Gore-Tex graft.

## 2013-01-07 NOTE — Procedures (Signed)
Patient was seen on dialysis and the procedure was supervised. BFR 250 Via RIJ PC BP is 135/77.  Patient appears to be tolerating treatment well

## 2013-01-07 NOTE — Progress Notes (Signed)
Patients' HD Catheter  is actively bleeding at the upper incision and exit site. Dr Hyman Hopes page and informed after an  unsuccessful attempt by the IV nurse to stop the bleeding .  Order received for 20 mcg of DDAVP x 1 and apply pressure to the site again.

## 2013-01-07 NOTE — Progress Notes (Signed)
[facility] PROGRESS NOTE  [name] ZOX:[id number] DOB: March 04, 1924 DOA: 01/04/2013 PCP: Georgianne Fick, MD  Assessment/Plan: AKI on CKD IV- possibly due to exacerbation in CHF as result of diuretic dose reduction; appreciate nephrology and cariology.   Not responding to max dose of lasix.  -labs today shoe hyperkalemia and continued worsening renal failure.  -AVF not usable -Dr. Arbie Cookey will place [facility] in the OR  -Dialysis per nephro  DM -SSI -may need low dose basal insulin after procedure  Bradycardia -holding beta blocker -cards consult  CHF - As above. Per notes cardiac issues in the past attributed to myxedema. More recent notes by Dr. Rennis Golden indicate EF had returned to normal. In 01/2012 EF was 55% Repeat echo EF 65-70%  Hypothyroidism - Prior h/o myxedema.TSH 4.2  PAD - stable  Hyperkalemia - dialysis  Anemia - Hb >10 -iron panel pending  Spoke at length with daughter about overall prognosis and patient may not be able to tolerate dialysis- may need palliative care consult- has been living with son and he had a stroke and is in the hospital- daughter requesting SNF   Code Status: full Family Communication: patient Disposition Plan: sdu   Consultants:  Vascular  Cards  nephro  Procedures:    Antibiotics:    HPI/Subjective: Having pain at site of catheter Breathing better  Objective: Filed Vitals:   01/07/13 0733  BP: 148/65  Pulse: 68  Temp: 97.8 F (36.6 C)  Resp:     Intake/Output Summary (Last 24 hours) at 01/07/13 1032 Last data filed at 01/07/13 0900  Gross per 24 hour  Intake    640 ml  Output   2309 ml  Net  -1669 ml   Filed Weights   01/05/13 0200 01/06/13 0223  Weight: 63.2 kg (139 lb 5.3 oz) 64.3 kg (141 lb 12.1 oz)    Exam:  General appearance: alert, ill appearing, but non-toxic.  Neck: no carotid bruit   Lungs: diminished breath sounds bibasilar and rales bibasilar  Heart: bradycardic  Abdomen:  protuberant, obese & mildly distended,   Extremities: extremities normal, atraumatic, no cyanosis or edema and L BKA  Neurologic: Mental status: Alert, oriented, thought content appropriate    Data Reviewed: Basic Metabolic Panel:  Recent Labs Lab 01/01/13 0951 01/04/13 2315 01/06/13 0450 01/07/13 0412  NA 137 137 127* 130*  K 4.3 5.3* 6.0* 4.7  CL 103 102 89* 91*  CO2 24 20 16* 23  GLUCOSE 132* 192* 308* 189*  BUN 57* 74* 95* 60*  CREATININE 2.70* 4.18* 5.26* 3.53*  CALCIUM 9.1 8.3* 8.3* 8.0*  PHOS  --   --  6.0* 5.7*   Liver Function Tests:  Recent Labs Lab 01/04/13 2315 01/06/13 0450 01/07/13 0412  AST 25  --   --   ALT 18  --   --   ALKPHOS 99  --   --   BILITOT 0.2*  --   --   PROT 6.4  --   --   ALBUMIN 3.4* 3.7 3.6   No results found for this basename: LIPASE, AMYLASE,  in the last 168 hours No results found for this basename: AMMONIA,  in the last 168 hours CBC:  Recent Labs Lab 01/04/13 2315 01/06/13 0500  WBC 8.8 7.1  NEUTROABS 5.8  --   HGB 11.0* 10.2*  HCT 33.4* 31.2*  MCV 87.0 86.2  PLT 350 320   Cardiac Enzymes:  Recent Labs Lab 01/04/13 2315 01/05/13 0130 01/05/13 0825 01/05/13 1330  TROPONINI <0.30 <0.30 <0.30 <0.30   BNP (last 3 results)  Recent Labs  01/04/13 2315  PROBNP 16572.0*   CBG:  Recent Labs Lab 01/06/13 1621 01/06/13 1929 01/06/13 2310 01/07/13 0527 01/07/13 0727  GLUCAP 113* 128* 173* 203* 204*    Recent Results (from the past 240 hour(s))  MRSA PCR SCREENING     Status: None   Collection Time    01/05/13  2:02 AM      Result Value Range Status   MRSA by PCR NEGATIVE  NEGATIVE Final   Comment:            The GeneXpert MRSA Assay (FDA     approved for NASAL specimens     only), is one component of a     comprehensive MRSA colonization     surveillance program. It is not     intended to diagnose MRSA     infection nor to guide or     monitor treatment for     MRSA infections.  URINE CULTURE      Status: None   Collection Time    01/05/13  3:02 AM      Result Value Range Status   Specimen Description URINE, CATHETERIZED   Final   Special Requests NONE   Final   Culture  Setup Time     Final   Value: 01/05/2013 16:13     Performed at [facility]   Colony Count     Final   Value: NO GROWTH     Performed at [facility]   Culture     Final   Value: NO GROWTH     Performed at [facility]   Report Status 01/06/2013 FINAL   Final     Studies: Dg Chest Port 1 View  01/06/2013   CLINICAL DATA:  Central line placement  EXAM: PORTABLE CHEST - 1 VIEW  COMPARISON:  01/04/2013  FINDINGS: Right internal jugular dual-lumen tunneled central venous catheter has its tips in the right atrium. No pneumothorax.  Cardiac silhouette is mildly enlarged. Mediastinum is normal in contour. There is central vascular congestion. Lung base interstitial opacities are noted. Hazy lung base opacity suggests small effusions. No convincing infiltrate.  IMPRESSION: New right internal jugular dual-lumen central venous catheter has its tips in the right atrium. No pneumothorax.  Central vascular congestion appears increased from the prior exam and interstitial densities in the bases have also increased. This is consistent with mild pulmonary edema. There is evidence of small associated pleural effusions.   Electronically Signed   By: Amie Portland M.D.   On: 01/06/2013 11:30   Dg Fluoro Guide Cv Line-no Report  01/06/2013   CLINICAL DATA: Diatek insertion   FLOURO GUIDE CV LINE  Fluoroscopy was utilized by the requesting physician.  No radiographic  interpretation.     Scheduled Meds: . allopurinol  100 mg Oral Daily  . aspirin EC  81 mg Oral Daily  . calcium acetate  667 mg Oral TID WC  . carvedilol  3.125 mg Oral BID WC  . darbepoetin (ARANESP) injection - DIALYSIS  60 mcg Intravenous Q Mon-HD  . [START ON 01/08/2013] enoxaparin (LOVENOX) injection  30 mg Subcutaneous Daily  .  gabapentin  300 mg Oral BID  . insulin aspart  0-9 Units Subcutaneous Q4H  . isosorbide mononitrate  60 mg Oral Daily  . levothyroxine  75 mcg Oral QAC breakfast  . multivitamin  1 tablet Oral QHS  .  simvastatin  20 mg Oral q1800  . sodium chloride  3 mL Intravenous Q12H  . Travoprost (BAK Free)  1 drop Both Eyes QHS   Continuous Infusions: . 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 If 7PM-7AM, please contact night-coverage at [email or web address], password [facility] 01/07/2013, 10:32 AM  LOS: 3 days

## 2013-01-07 NOTE — Progress Notes (Signed)
Pts. SBP was >180 asymptomatic, had some wheezing on assessment but not in distress o2 sats was 94-95%. Donnamarie Poag NP was notified of pts. vitals signs and symptoms. Will cont. to monitor pts. BP and ordered some breathing tx. PRN.

## 2013-01-07 NOTE — Progress Notes (Signed)
DDVP hung and pressure held at  HD site as order. We will continue to monitor.

## 2013-01-07 NOTE — Progress Notes (Signed)
Inpatient Diabetes Program Recommendations  AACE/ADA: New Consensus Statement on Inpatient Glycemic Control (2013)  Target Ranges:  Prepandial:   less than 140 mg/dL      Peak postprandial:   less than 180 mg/dL (1-2 hours)      Critically ill patients:  140 - 180 mg/dL   Reason for Assessment: Hyperglycemia  79 year old female admitted with SOB. Hx DM2, cariomyopathy, CKDIII, CAD, CHF. On Onglyza 5mg  QD PTA.  Results for Shannon, [name] (MRN [id number]) as of 01/07/2013 15:05  Ref. Range 01/06/2013 07:41 01/06/2013 11:00 01/06/2013 12:24 01/06/2013 16:21 01/06/2013 19:29 01/06/2013 23:10 01/07/2013 05:27 01/07/2013 07:27  Glucose-Capillary Latest Range: 70-99 mg/dL 409 (H) 811 (H) 914 (H) 113 (H) 128 (H) 173 (H) 203 (H) 204 (H)  Results for Shannon, [name] (MRN [id number]) as of 01/07/2013 15:05  Ref. Range 01/05/2013 01:30  Hemoglobin A1C Latest Range: <5.7 % 7.7 (H)    Inpatient Diabetes Program Recommendations Insulin - Basal: Add Lantus 5 units QHS Correction (SSI): Novolog sensitive tidwc HgbA1C: 7.7% on 01/05/2013 Diet: Add CHO mod med to heart healthy diet  Note: Will follow while inpatient.  Thank you. Ailene Ards, RD, LDN, CDE Inpatient Diabetes Coordinator [phone]

## 2013-01-07 NOTE — Progress Notes (Addendum)
Pt. Seen and examined. Agree with the NP/PA-C note as written.  Doing better with dialysis. Over 1L negative. Some bleeding from the dialysis cath site. Agree with holding plavix. EF now hyperdynamic at 65-70%. Add prn dilaudid for breakthrough pain.  Chrystie Nose, MD, [facility] Attending Cardiologist [facility]

## 2013-01-08 DIAGNOSIS — I120 Hypertensive chronic kidney disease with stage 5 chronic kidney disease or end stage renal disease: Secondary | ICD-10-CM

## 2013-01-08 DIAGNOSIS — N186 End stage renal disease: Secondary | ICD-10-CM

## 2013-01-08 LAB — GLUCOSE, CAPILLARY
Glucose-Capillary: 130 mg/dL — ABNORMAL HIGH (ref 70–99)
Glucose-Capillary: 115 mg/dL — ABNORMAL HIGH (ref 70–99)
Glucose-Capillary: 134 mg/dL — ABNORMAL HIGH (ref 70–99)

## 2013-01-08 LAB — RENAL FUNCTION PANEL
Albumin: 3.6 g/dL (ref 3.5–5.2)
CO2: 30 mEq/L (ref 19–32)
GFR calc Af Amer: 19 mL/min — ABNORMAL LOW (ref >90)
GFR calc non Af Amer: 16 mL/min — ABNORMAL LOW (ref >90)
Phosphorus: 3.9 mg/dL (ref 2.3–4.6)
Potassium: 4.1 mEq/L (ref 3.5–5.1)

## 2013-01-08 MED ORDER — AMLODIPINE BESYLATE 10 MG PO TABS
10.0000 mg | ORAL_TABLET | Freq: Every day | ORAL | Status: DC
  Administered 2013-01-08 – 2013-01-16 (×9): 10 mg via ORAL
  Filled 2013-01-08 (×10): qty 1

## 2013-01-08 MED ORDER — CARVEDILOL 6.25 MG PO TABS
6.2500 mg | ORAL_TABLET | Freq: Two times a day (BID) | ORAL | Status: DC
  Administered 2013-01-09 – 2013-01-16 (×16): 6.25 mg via ORAL
  Filled 2013-01-08 (×17): qty 1

## 2013-01-08 MED ORDER — HEPARIN SODIUM (PORCINE) 1000 UNIT/ML DIALYSIS
20.0000 [IU]/kg | INTRAMUSCULAR | Status: DC | PRN
  Administered 2013-01-08: 1200 [IU] via INTRAVENOUS_CENTRAL

## 2013-01-08 MED ORDER — INSULIN ASPART 100 UNIT/ML ~~LOC~~ SOLN
0.0000 [IU] | Freq: Three times a day (TID) | SUBCUTANEOUS | Status: DC
  Administered 2013-01-09: 12:00:00 7 [IU] via SUBCUTANEOUS
  Administered 2013-01-10: 18:00:00 2 [IU] via SUBCUTANEOUS
  Administered 2013-01-10: 12:00:00 3 [IU] via SUBCUTANEOUS
  Administered 2013-01-10 – 2013-01-11 (×2): 1 [IU] via SUBCUTANEOUS
  Administered 2013-01-11: 12:00:00 9 [IU] via SUBCUTANEOUS
  Administered 2013-01-11: 1 [IU] via SUBCUTANEOUS
  Administered 2013-01-12: 3 [IU] via SUBCUTANEOUS
  Administered 2013-01-12 – 2013-01-13 (×2): 1 [IU] via SUBCUTANEOUS
  Administered 2013-01-13: 3 [IU] via SUBCUTANEOUS
  Administered 2013-01-13: 07:00:00 2 [IU] via SUBCUTANEOUS
  Administered 2013-01-14: 08:00:00 1 [IU] via SUBCUTANEOUS
  Administered 2013-01-14: 3 [IU] via SUBCUTANEOUS
  Administered 2013-01-14 – 2013-01-15 (×3): 2 [IU] via SUBCUTANEOUS
  Administered 2013-01-15: 3 [IU] via SUBCUTANEOUS
  Administered 2013-01-16: 07:00:00 2 [IU] via SUBCUTANEOUS
  Administered 2013-01-16 (×2): 3 [IU] via SUBCUTANEOUS

## 2013-01-08 MED ORDER — INSULIN GLARGINE 100 UNIT/ML ~~LOC~~ SOLN
5.0000 [IU] | Freq: Every day | SUBCUTANEOUS | Status: DC
  Administered 2013-01-08 – 2013-01-16 (×9): 5 [IU] via SUBCUTANEOUS
  Filled 2013-01-08 (×9): qty 0.05

## 2013-01-08 MED ORDER — GABAPENTIN 300 MG PO CAPS
300.0000 mg | ORAL_CAPSULE | Freq: Every day | ORAL | Status: DC
  Administered 2013-01-08 – 2013-01-15 (×8): 300 mg via ORAL
  Filled 2013-01-08 (×11): qty 1

## 2013-01-08 MED ORDER — INSULIN ASPART 100 UNIT/ML ~~LOC~~ SOLN
0.0000 [IU] | Freq: Every day | SUBCUTANEOUS | Status: DC
  Administered 2013-01-15: 21:00:00 2 [IU] via SUBCUTANEOUS

## 2013-01-08 NOTE — Progress Notes (Addendum)
[facility] PROGRESS NOTE  [name] ZOX:[id number] DOB: 1923-05-14 DOA: 01/04/2013 PCP: Georgianne Fick, MD  [name] is a 79 y.o. female with Multiple Medical Problems including CAD, CHF, Myxedema Cardiomyopathy and CKD Stage III- V, who was brought to the ED with complaints of worsening SOB and Wheezing over the past 24 hours. Did not respond to lasix so catheter was placed and dialysis begun.  Will need permanent placement of access  Assessment/Plan: AKI on CKD IV- possibly due to exacerbation in CHF as result of diuretic dose reduction; appreciate nephrology and cariology.   Not responding to max dose of lasix.   -AVF not usable- Dr. Arbie Cookey placed catheter  -Dialysis per nephro -will need permanent access  DM -SSI -added low dose lantus  Bradycardia -improved off BB  CHF - As above. Per notes cardiac issues in the past attributed to myxedema. More recent notes by Dr. Rennis Golden indicate EF had returned to normal. In 01/2012 EF was 55% Repeat echo EF 65-70%  Hypothyroidism - Prior h/o myxedema.TSH 4.2  PAD - stable  Hyperkalemia - dialysis  Anemia - Hb >10 -iron panel pending  Spoke at length with daughter about overall prognosis and patient may not be able to tolerate dialysis- may need palliative care consult- has been living with son and he had a stroke and is in the hospital- daughter requesting SNF   Code Status: full Family Communication: patient Disposition Plan: sdu   Consultants:  Vascular  Cards  nephro  Procedures:  HD  Right IJ HD cath- 10/19  Antibiotics:    HPI/Subjective: Having pain at site of catheter Breathing better Up with PT  Objective: Filed Vitals:   01/08/13 0715  BP: 161/69  Pulse:   Temp: 98 F (36.7 C)  Resp: 12    Intake/Output Summary (Last 24 hours) at 01/08/13 0829 Last data filed at 01/08/13 0600  Gross per 24 hour  Intake    600 ml  Output   1764 ml  Net  -1164 ml   Filed Weights   01/07/13 1040 01/07/13 1400 01/08/13 0600  Weight: 64.4 kg (141 lb 15.6 oz) 62.1 kg (136 lb 14.5 oz) 62.2 kg (137 lb 2 oz)    Exam:  General appearance: alert, ill appearing, but non-toxic.  Neck: no carotid bruit   Lungs: diminished breath sounds bibasilar  Heart: bradycardic  Abdomen: protuberant, obese & mildly distended,   Extremities: extremities normal, atraumatic, no cyanosis or edema and L BKA  Neurologic: no focal defects    Data Reviewed: Basic Metabolic Panel:  Recent Labs Lab 01/01/13 0951 01/04/13 2315 01/06/13 0450 01/07/13 0412 01/08/13 0420  NA 137 137 127* 130* 137  K 4.3 5.3* 6.0* 4.7 4.1  CL 103 102 89* 91* 96  CO2 24 20 16* 23 30  GLUCOSE 132* 192* 308* 189* 151*  BUN 57* 74* 95* 60* 32*  CREATININE 2.70* 4.18* 5.26* 3.53* 2.51*  CALCIUM 9.1 8.3* 8.3* 8.0* 8.1*  PHOS  --   --  6.0* 5.7* 3.9   Liver Function Tests:  Recent Labs Lab 01/04/13 2315 01/06/13 0450 01/07/13 0412 01/08/13 0420  AST 25  --   --   --   ALT 18  --   --   --   ALKPHOS 99  --   --   --   BILITOT 0.2*  --   --   --   PROT 6.4  --   --   --   ALBUMIN 3.4* 3.7  3.6 3.6   No results found for this basename: LIPASE, AMYLASE,  in the last 168 hours No results found for this basename: AMMONIA,  in the last 168 hours CBC:  Recent Labs Lab 01/04/13 2315 01/06/13 0500 01/07/13 1048  WBC 8.8 7.1 9.8  NEUTROABS 5.8  --   --   HGB 11.0* 10.2* 9.3*  HCT 33.4* 31.2* 28.0*  MCV 87.0 86.2 85.9  PLT 350 320 275   Cardiac Enzymes:  Recent Labs Lab 01/04/13 2315 01/05/13 0130 01/05/13 0825 01/05/13 1330  TROPONINI <0.30 <0.30 <0.30 <0.30   BNP (last 3 results)  Recent Labs  01/04/13 2315  PROBNP 16572.0*   CBG:  Recent Labs Lab 01/07/13 1718 01/07/13 2016 01/08/13 0009 01/08/13 0334 01/08/13 0717  GLUCAP 160* 182* 90 130* 122*    Recent Results (from the past 240 hour(s))  MRSA PCR SCREENING     Status: None   Collection Time    01/05/13  2:02 AM       Result Value Range Status   MRSA by PCR NEGATIVE  NEGATIVE Final   Comment:            The GeneXpert MRSA Assay (FDA     approved for NASAL specimens     only), is one component of a     comprehensive MRSA colonization     surveillance program. It is not     intended to diagnose MRSA     infection nor to guide or     monitor treatment for     MRSA infections.  URINE CULTURE     Status: None   Collection Time    01/05/13  3:02 AM      Result Value Range Status   Specimen Description URINE, CATHETERIZED   Final   Special Requests NONE   Final   Culture  Setup Time     Final   Value: 01/05/2013 16:13     Performed at [facility]   Colony Count     Final   Value: NO GROWTH     Performed at [facility]   Culture     Final   Value: NO GROWTH     Performed at [facility]   Report Status 01/06/2013 FINAL   Final     Studies: Dg Chest Port 1 View  01/06/2013   CLINICAL DATA:  Central line placement  EXAM: PORTABLE CHEST - 1 VIEW  COMPARISON:  01/04/2013  FINDINGS: Right internal jugular dual-lumen tunneled central venous catheter has its tips in the right atrium. No pneumothorax.  Cardiac silhouette is mildly enlarged. Mediastinum is normal in contour. There is central vascular congestion. Lung base interstitial opacities are noted. Hazy lung base opacity suggests small effusions. No convincing infiltrate.  IMPRESSION: New right internal jugular dual-lumen central venous catheter has its tips in the right atrium. No pneumothorax.  Central vascular congestion appears increased from the prior exam and interstitial densities in the bases have also increased. This is consistent with mild pulmonary edema. There is evidence of small associated pleural effusions.   Electronically Signed   By: Amie Portland M.D.   On: 01/06/2013 11:30   Dg Fluoro Guide Cv Line-no Report  01/06/2013   CLINICAL DATA: Diatek insertion   FLOURO GUIDE CV LINE  Fluoroscopy was utilized by  the requesting physician.  No radiographic  interpretation.     Scheduled Meds: . allopurinol  100 mg Oral Daily  . aspirin EC  81 mg Oral Daily  . calcium acetate  667 mg Oral TID WC  . carvedilol  3.125 mg Oral BID WC  . darbepoetin (ARANESP) injection - DIALYSIS  60 mcg Intravenous Q Mon-HD  . enoxaparin (LOVENOX) injection  30 mg Subcutaneous Daily  . ferric gluconate (FERRLECIT/NULECIT) IV  125 mg Intravenous Q M,W,F-HD  . gabapentin  300 mg Oral QHS  . insulin aspart  0-9 Units Subcutaneous Q4H  . insulin glargine  5 Units Subcutaneous Daily  . isosorbide mononitrate  60 mg Oral Daily  . levothyroxine  75 mcg Oral QAC breakfast  . multivitamin  1 tablet Oral QHS  . simvastatin  20 mg Oral q1800  . sodium chloride  3 mL Intravenous Q12H  . Travoprost (BAK Free)  1 drop Both Eyes QHS   Continuous Infusions: . 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 If 7PM-7AM, please contact night-coverage at [email or web address], password [email or web address] 01/08/2013, 8:29 AM  LOS: 4 days

## 2013-01-08 NOTE — Progress Notes (Signed)
01/08/2013 10:58 AM Hemodialysis Outpatient Note: I have initiated the process for outpatient placement.I will update when I have confirmation of placement. Thank you. Tilman Neat

## 2013-01-08 NOTE — Clinical Social Work Note (Addendum)
2:45pm, 01/08/13 CSW has sent pt's clinicals for insurance authorization to [facility] Shied case Investment banker, corporate [phone]). CSW left Carollee Herter a voicemail saying she sent these clinicals and is awaiting reply from representative.  CSW consulted for SNF placement. Pt is dialysis--CSW will assess tomorrow. CSW will submit clinicals to [employer] case manager today to begin insurance authorization process.   Maryclare Labrador, MSW, [facility] Clinical Social Worker [phone]

## 2013-01-08 NOTE — Progress Notes (Signed)
Patient ID: BERLIN VIERECK, female   DOB: [birth year], 79 y.o.   MRN: [id number]  [facility] Progress Note    Subjective:   Pt feels better but had cramping with HD yesterday as well as hypotension   Objective:   BP 161/69  Pulse 75  Temp(Src) 98 F (36.7 C) (Oral)  Resp 12  Ht 4\' 11"  (1.499 m)  Wt 62.2 kg (137 lb 2 oz)  BMI 27.68 kg/m2  SpO2 99%  Intake/Output: I/O last 3 completed shifts: In: 750 [P.O.:700; IV Piggyback:50] Out: 2604 [Urine:965; Emesis/NG output:101; Other:1538]   Intake/Output this shift:  Total I/O In: 120 [P.O.:120] Out: -  Weight change:   Physical Exam: YQM:VHQIONG AAF in NAD CVS:no rub Resp:cta EXB:MWUXLK Ext:s/p left BKA, no edema  Labs: BMET  Recent Labs Lab 01/04/13 2315 01/06/13 0450 01/07/13 0412 01/08/13 0420  NA 137 127* 130* 137  K 5.3* 6.0* 4.7 4.1  CL 102 89* 91* 96  CO2 20 16* 23 30  GLUCOSE 192* 308* 189* 151*  BUN 74* 95* 60* 32*  CREATININE 4.18* 5.26* 3.53* 2.51*  ALBUMIN 3.4* 3.7 3.6 3.6  CALCIUM 8.3* 8.3* 8.0* 8.1*  PHOS  --  6.0* 5.7* 3.9   CBC  Recent Labs Lab 01/04/13 2315 01/06/13 0500 01/07/13 1048  WBC 8.8 7.1 9.8  NEUTROABS 5.8  --   --   HGB 11.0* 10.2* 9.3*  HCT 33.4* 31.2* 28.0*  MCV 87.0 86.2 85.9  PLT 350 320 275    @IMGRELPRIORS @ Medications:    . allopurinol  100 mg Oral Daily  . aspirin EC  81 mg Oral Daily  . calcium acetate  667 mg Oral TID WC  . carvedilol  3.125 mg Oral BID WC  . darbepoetin (ARANESP) injection - DIALYSIS  60 mcg Intravenous Q Mon-HD  . enoxaparin (LOVENOX) injection  30 mg Subcutaneous Daily  . ferric gluconate (FERRLECIT/NULECIT) IV  125 mg Intravenous Q M,W,F-HD  . gabapentin  300 mg Oral QHS  . insulin aspart  0-9 Units Subcutaneous Q4H  . insulin glargine  5 Units Subcutaneous Daily  . isosorbide mononitrate  60 mg Oral Daily  . levothyroxine  75 mcg Oral QAC breakfast  . multivitamin  1 tablet Oral QHS  . simvastatin  20 mg Oral  q1800  . sodium chloride  3 mL Intravenous Q12H  . Travoprost (BAK Free)  1 drop Both Eyes QHS     Assessment/ Plan:   1. CHF/CMP- improving with HD and UF. HR has improved. 2. AKI/CKD stage 4, possibly now ESRD due to cardiorenal syndrome: cont with IHD and follow UOP and daily Scr. No urine output today.  Will plan 3rd HD session today.  Agree that she is a poor longterm HD candidate and palliative care consult may be helpful to set goals/limits of care.  She does not want to go to a SNF, however she is not strong enough to go home.   3. Hyperkalemia: Improved post HD 4. Metabolic acidosis- improved post HD 5. Anemia: on IV Iron and ESA.  6. CKD-MBD: improving. On binders and follow 7. Hypothyroidism- prior h/o myxedema. TSH stable 8. Nutrition: improving 9. Hypertension:stable 10. Vascular access- new RIJ PC. Dr. Arbie Cookey evaluated this as it was bleeding but has since stopped.  For L AVG when stable per VVS 11. Dispo- pt does not want [place] to SNF.  D/w family and agree with palliative care evaluation in an 79yo with CMP on HD.  Avon Molock A 01/08/2013, 9:57 AM

## 2013-01-08 NOTE — Progress Notes (Signed)
Subjective: Some wheezing last night.  Better now.  Objective: Vital signs in last 24 hours: Temp:  [97.2 F (36.2 C)-98.4 F (36.9 C)] 98 F (36.7 C) (10/21 0715) Pulse Rate:  [56-75] 60 (10/20 2345) Resp:  [8-15] 12 (10/21 0715) BP: (96-181)/(46-83) 161/69 mmHg (10/21 0715) SpO2:  [90 %-100 %] 99 % (10/21 0715) Weight:  [136 lb 14.5 oz (62.1 kg)-141 lb 15.6 oz (64.4 kg)] 137 lb 2 oz (62.2 kg) (10/21 0600) Last BM Date: 01/06/13  Intake/Output from previous day: 10/20 0701 - 10/21 0700 In: 600 [P.O.:600] Out: 2164 [Urine:525; Emesis/NG output:101] Intake/Output this shift:    Medications Current Facility-Administered Medications  Medication Dose Route Frequency Provider Last Rate Last Dose  . 0.9 %  sodium chloride infusion  250 mL Intravenous PRN Ron Parker, MD      . acetaminophen (TYLENOL) tablet 650 mg  650 mg Oral Q4H PRN Ron Parker, MD   650 mg at 01/06/13 2148  . albuterol (PROVENTIL,VENTOLIN) solution continuous neb  10 mg/hr Nebulization Continuous Loren Racer, MD   10 mg/hr at 01/04/13 2325  . allopurinol (ZYLOPRIM) tablet 100 mg  100 mg Oral Daily Ron Parker, MD   100 mg at 01/07/13 1610  . aspirin EC tablet 81 mg  81 mg Oral Daily Abelino Derrick, PA-C   81 mg at 01/07/13 1424  . calcium acetate (PHOSLO) capsule 667 mg  667 mg Oral TID WC Irena Cords, MD   667 mg at 01/07/13 1815  . carvedilol (COREG) tablet 3.125 mg  3.125 mg Oral BID WC Eda Paschal Kilroy, PA-C   3.125 mg at 01/07/13 1815  . darbepoetin (ARANESP) injection 60 mcg  60 mcg Intravenous Q Mon-HD Irena Cords, MD   60 mcg at 01/07/13 1254  . enoxaparin (LOVENOX) injection 30 mg  30 mg Subcutaneous Daily Luke K Kilroy, PA-C      . ferric gluconate (NULECIT) 125 mg in sodium chloride 0.9 % 100 mL IVPB  125 mg Intravenous Q M,W,F-HD Irena Cords, MD   125 mg at 01/07/13 1254  . gabapentin (NEURONTIN) capsule 300 mg  300 mg Oral QHS Joseph Art, DO      .  HYDROcodone-acetaminophen (NORCO/VICODIN) 5-325 MG per tablet 1 tablet  1 tablet Oral Q6H PRN Joseph Art, DO   1 tablet at 01/07/13 0941  . HYDROmorphone (DILAUDID) injection 1 mg  1 mg Intravenous Q2H PRN Chrystie Nose, MD      . insulin aspart (novoLOG) injection 0-9 Units  0-9 Units Subcutaneous Q4H Ron Parker, MD   1 Units at 01/08/13 0539  . insulin glargine (LANTUS) injection 5 Units  5 Units Subcutaneous Daily Jessica U Vann, DO      . isosorbide mononitrate (IMDUR) 24 hr tablet 60 mg  60 mg Oral Daily Ron Parker, MD   60 mg at 01/07/13 1424  . levalbuterol (XOPENEX) nebulizer solution 0.63 mg  0.63 mg Nebulization Q6H PRN Leda Gauze, NP      . levothyroxine (SYNTHROID, LEVOTHROID) tablet 75 mcg  75 mcg Oral QAC breakfast Ron Parker, MD   75 mcg at 01/07/13 1424  . multivitamin (RENA-VIT) tablet 1 tablet  1 tablet Oral QHS Irena Cords, MD   1 tablet at 01/07/13 2131  . nitroGLYCERIN (NITROSTAT) SL tablet 0.4 mg  0.4 mg Sublingual Q5 min PRN Ron Parker, MD      . ondansetron [facility]) injection  4 mg  4 mg Intravenous Q6H PRN Ron Parker, MD   4 mg at 01/07/13 2151  . simvastatin (ZOCOR) tablet 20 mg  20 mg Oral q1800 Ron Parker, MD   20 mg at 01/07/13 1815  . sodium chloride 0.9 % injection 3 mL  3 mL Intravenous Q12H Ron Parker, MD   3 mL at 01/07/13 2132  . sodium chloride 0.9 % injection 3 mL  3 mL Intravenous PRN Ron Parker, MD      . Travoprost (BAK Free) (TRAVATAN) 0.004 % ophthalmic solution SOLN 1 drop  1 drop Both Eyes QHS Ron Parker, MD   1 drop at 01/07/13 2133    PE: General appearance: alert, cooperative and no distress Lungs: Mild basilar crackles Heart: regular rate and rhythm, S1, S2 normal, no murmur, click, rub or gallop Abdomen: + BS,  abd is tense but nontender Extremities: No right LEE Pulses: 2+ and symmetric, 1+ R DP Skin: Warm and dry Neurologic: Grossly normal  Lab  Results:   Recent Labs  01/06/13 0500 01/07/13 1048  WBC 7.1 9.8  HGB 10.2* 9.3*  HCT 31.2* 28.0*  PLT 320 275   BMET  Recent Labs  01/06/13 0450 01/07/13 0412 01/08/13 0420  NA 127* 130* 137  K 6.0* 4.7 4.1  CL 89* 91* 96  CO2 16* 23 30  GLUCOSE 308* 189* 151*  BUN 95* 60* 32*  CREATININE 5.26* 3.53* 2.51*  CALCIUM 8.3* 8.0* 8.1*    Assessment/Plan  Principal Problem:   Acute respiratory failure Active Problems:   CAD - moderate at cath July 2012 (medical Rx)   Myxedema cardiomyopathy, 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  EF 65-70%.  Back on coreg 3.125.  BP elevated.  Consider titrating coreg or adding back amlodipine.    LOS: 4 days    HAGER, BRYAN 01/08/2013 8:44 AM  Improving with HD - I acknowledged Dr.Colodonato's thoughts re: long term HD. ?? This will lead to need for Palliative Care consideration, as there is  A high likelihood of recurrence of her presenting condition.  Will add back Amlodipine & increase Coreg to 6.25 mg bid.  Marykay Lex, MD

## 2013-01-08 NOTE — Evaluation (Signed)
Physical Therapy Evaluation Patient Details Name: [name] MRN: [id number] DOB: [birth year] Today's Date: 01/08/2013 Time: 7253-6644 PT Time Calculation (min): 15 min  PT Assessment / Plan / Recommendation History of Present Illness  [name] is a 79 y.o. female with Multiple Medical Problems including CAD, CHF, Myxedema Cardiomyopathy and CKD Stage III- V, who was brought to the ED with complaints of worsening SOB and Wheezing . Acute renal failure with initiation of HD.  Clinical Impression  Pt is very pleasant and states she is typically able to perform basic ADLs and mobility without assist although unable to demonstrate today. Discussed with pt the notations from chart that family requesting SNF as son who is caregiver is currently hospitalized. Pt states she will not consider SNF and that dgtr can care for her. Pt with below problem list and will benefit from acute therapy to maximize mobility, transfer and function to return pt to PLOF and decrease burden of care. Should family be able to provide supervision and assist for transfers then home with HHPT is reasonable if family unable then ST-SNF recommended for pt safety and mobility.     PT Assessment  Patient needs continued PT services    Follow Up Recommendations  Supervision for mobility/OOB;Home health PT;SNF    Does the patient have the potential to tolerate intense rehabilitation      Barriers to Discharge Decreased caregiver support      Equipment Recommendations  None recommended by PT    Recommendations for Other Services     Frequency Min 3X/week    Precautions / Restrictions Precautions Precautions: Fall Precaution Comments: L BKA   Pertinent Vitals/Pain Pt sats 99-100% on 2L attempted on RA but sats dropped to 87% HR 68 No pain      Mobility  Bed Mobility Bed Mobility: Supine to Sit Supine to Sit: 5: Supervision;HOB flat;With rails Details for Bed Mobility Assistance: reliance on rail to  transfer to sitting with cues for scooting to EOB Transfers Transfers: Squat Pivot Transfers Squat Pivot Transfers: 4: Min assist;With armrests Details for Transfer Assistance: cueing for full anterior translation with RLE blocked and assist to complete transfer safely as well as mod assist with pad to scoot fully back onto surface Ambulation/Gait Ambulation/Gait Assistance: Not tested (comment) Wheelchair Mobility Wheelchair Mobility: No    Exercises     PT Diagnosis: Generalized weakness  PT Problem List: Decreased activity tolerance;Decreased mobility;Cardiopulmonary status limiting activity PT Treatment Interventions: Therapeutic activities;Therapeutic exercise;Functional mobility training;Patient/family education     PT Goals(Current goals can be found in the care plan section) Acute Rehab PT Goals Patient Stated Goal: return home PT Goal Formulation: With patient Time For Goal Achievement: 01/22/13 Potential to Achieve Goals: Good  Visit Information  Last PT Received On: 01/08/13 Assistance Needed: +1 History of Present Illness: [name] is a 79 y.o. female with Multiple Medical Problems including CAD, CHF, Myxedema Cardiomyopathy and CKD Stage III- V, who was brought to the ED with complaints of worsening SOB and Wheezing . Acute renal failure with initiation of HD.       Prior Functioning  Home Living Family/patient expects to be discharged to:: Private residence Living Arrangements: Children Available Help at Discharge: Family;Available PRN/intermittently Type of Home: House Home Access: Ramped entrance Home Layout: One level Home Equipment: Bedside commode;Shower seat;Walker - 2 wheels;Wheelchair - power Prior Function Level of Independence: Needs assistance Gait / Transfers Assistance Needed: pt states she can pivot to WC and BSC without assist ADL's /  Homemaking Assistance Needed: family does all meals and housework, pt sponge bathes herself and dresses  herself Communication Communication: No difficulties    Cognition  Cognition Arousal/Alertness: Awake/alert Behavior During Therapy: WFL for tasks assessed/performed Overall Cognitive Status: Within Functional Limits for tasks assessed    Extremity/Trunk Assessment Upper Extremity Assessment Upper Extremity Assessment: Generalized weakness Lower Extremity Assessment Lower Extremity Assessment: Generalized weakness (RLE hip and knee flexion 4+/5, knee extension 4+/5) Cervical / Trunk Assessment Cervical / Trunk Assessment: Normal   Balance Balance Balance Assessed: Yes Static Sitting Balance Static Sitting - Balance Support: Feet unsupported;Right upper extremity supported Static Sitting - Level of Assistance: 6: Modified independent (Device/Increase time) Static Sitting - Comment/# of Minutes: 2  End of Session PT - End of Session Activity Tolerance: Patient tolerated treatment well Patient left: in chair;with call bell/phone within reach;with nursing/sitter in room Nurse Communication: Mobility status;Other (comment) (sequence for squat pivot to right)  GP     Delorse Lek 01/08/2013, 8:57 AM Delaney Meigs, PT [phone]

## 2013-01-09 LAB — RENAL FUNCTION PANEL
Albumin: 3.4 g/dL — ABNORMAL LOW (ref 3.5–5.2)
BUN: 19 mg/dL (ref 6–23)
CO2: 28 mEq/L (ref 19–32)
Chloride: 95 mEq/L — ABNORMAL LOW (ref 96–112)
GFR calc non Af Amer: 24 mL/min — ABNORMAL LOW (ref >90)
Phosphorus: 2.4 mg/dL (ref 2.3–4.6)
Potassium: 3.9 mEq/L (ref 3.5–5.1)
Sodium: 136 mEq/L (ref 135–145)

## 2013-01-09 LAB — GLUCOSE, CAPILLARY: Glucose-Capillary: 117 mg/dL — ABNORMAL HIGH (ref 70–99)

## 2013-01-09 LAB — HEPATITIS B CORE ANTIBODY, IGM: Hep B C IgM: NONREACTIVE

## 2013-01-09 MED ORDER — HYDRALAZINE HCL 25 MG PO TABS
25.0000 mg | ORAL_TABLET | Freq: Three times a day (TID) | ORAL | Status: DC
  Administered 2013-01-09 – 2013-01-16 (×20): 25 mg via ORAL
  Filled 2013-01-09 (×25): qty 1

## 2013-01-09 MED ORDER — FUROSEMIDE 10 MG/ML IJ SOLN
40.0000 mg | Freq: Two times a day (BID) | INTRAMUSCULAR | Status: DC
  Administered 2013-01-09 – 2013-01-11 (×5): 40 mg via INTRAVENOUS
  Filled 2013-01-09 (×6): qty 4

## 2013-01-09 NOTE — Progress Notes (Signed)
The patient was pleasant this morning.  She did not have any complaints or acute changes overnight.

## 2013-01-09 NOTE — Progress Notes (Signed)
Palliative Medicine Team consult for goals of care discussion received -spoke with patient at bedside who requested writer to call her daughters as her son has been hospitalized with his own health issues- attempted to call daughter Debby Bud  [phone]) and daughter Cristy Friedlander [phone]) - left messages for both daughters and await call back to set meeting time   Valente David, RN 01/09/2013, 12:16 PM Palliative Medicine Team RN Liaison [phone]

## 2013-01-09 NOTE — Progress Notes (Signed)
Spoke with Dr. Westley Chandler regarding pt's iv ferric gluconate ordered to be given and was informed by HD unit pt is not having  HD today.  MD instructed will be given in HD when pt have emodialysis.  Ok not to give it on the floor.  Amanda Pea, Charity fundraiser.

## 2013-01-09 NOTE — Progress Notes (Signed)
[facility] PROGRESS NOTE  [name] ZOX:[id number] DOB: [birth year] DOA: 01/04/2013  PCP: Georgianne Fick, MD  Brief HPI: [name] is a 79 y.o. female with Multiple Medical Problems including CAD, CHF, Myxedema Cardiomyopathy and CKD Stage III- V, who was brought to the ED with complaints of worsening SOB and Wheezing. EMS was called to her home and found her O2 sats to be 70% and she was placed on supplemental O2, and had mild improvement. She was found to have findings of CHF. She has acute on chronic renal failure. She did not diurese with Lasix and required initiation of dialysis after a temp dialysis catheter was placed. She is now stable.   Past medical history:  Past Medical History  Diagnosis Date  . Coronary artery disease   . TIA (transient ischemic attack)   . Hypertension   . Shingles   . Stroke   . Anemia   . CAD (coronary artery disease) 02/08/2011  . Cardiomyopathy, idiopathic 02/08/2011  . Chronic kidney disease   . Angina     Takes Isosorbide  . Unstable angina 02/08/2011  . Myocardial infarct     x 3 unsure of years  . Irregular heartbeat   . Ulcer   . GERD (gastroesophageal reflux disease)     takes Protonix and zantac  . Hx of transient ischemic attack (TIA)   . Memory loss   . CHF (congestive heart failure)     Takes Lasix  . Gout     takes allopurinol  . Peripheral vascular disease     left lower  leg  . Diabetes mellitus     type 2 NIDDM x 2 years; no meds  . Chronic kidney disease (CKD), stage IV (severe)   . Arthritis   . Shortness of breath   . Thyroid disease   . Hypothyroidism     (SEVERE) Takes Levothryroxine  . Hypothyroid 02/08/2011  . Nonischemic cardiomyopathy     EF now is 55%, reduced due to myxedema, which is improved.  . Dyslipidemia   . Peripheral neuropathy   . H/O echocardiogram 09/06/11    Indication- nonIschemic Cardiomyopathy. EF = now greater than 55% with no regional wall motion abnormalities. Tthere  is mild to moderate trisuspid regurgitayion and mild pulmonary hypertension with an RVSP of 35 mmHg as well as stage 1 diastolic dysfunction and mild to moderate LVH.  Marland Kitchen Abnormal nuclear stress test 06/01/09    Demonstrated a new area of infarct scar, peri-infarct ischemia seen in the inferolateral territory. EF eas normal at 70% with mild hypocontractility at the apex, distal inferolateral wall.    Consultants:  Vascular  Cards  nephro  Procedures:  HD  Right IJ HD cath- 10/19  Antibiotics:  None  Subjective: Patrent feels better. Very reluctant about going to SNF but daughter has requested same. Patient has left BKA and lives with son who is indisposed. No family at bedside. Denies any chest pain or shortness of breath.  Objective: Vital Signs  Filed Vitals:   01/08/13 1830 01/08/13 2102 01/09/13 0613 01/09/13 0908  BP: 174/68 165/56 154/53 166/72  Pulse:  80 83 89  Temp:  98.8 F (37.1 C) 98.6 F (37 C)   TempSrc:  Oral Oral   Resp:  18 17   Height:      Weight:   57.8 kg (127 lb 6.8 oz)   SpO2:  95% 96%     Filed Weights   01/08/13 1522 01/08/13 1600 01/09/13 8119  Weight: 59.9 kg (132 lb 0.9 oz) 59.194 kg (130 lb 8 oz) 57.8 kg (127 lb 6.8 oz)    Intake/Output from previous day: 10/21 0701 - 10/22 0700 In: 363 [P.O.:360; I.V.:3] Out: 1193 [Urine:200]  General appearance: alert, cooperative, appears stated age and no distress Resp: diminished air entry at bases. No crackles or wheezing. Cardio: regular rate and rhythm, S1, S2 normal, no murmur, click, rub or gallop GI: soft, non-tender; bowel sounds normal; no masses,  no organomegaly Extremities: extremities normal, atraumatic, no cyanosis or edema and left BKA Skin: Skin color, texture, turgor normal. No rashes or lesions Neurologic: alert and oriented x 3. No focal deficits.  Lab Results:  Basic Metabolic Panel:  Recent Labs Lab 01/04/13 2315 01/06/13 0450 01/07/13 0412 01/08/13 0420 01/09/13 0625   NA 137 127* 130* 137 136  K 5.3* 6.0* 4.7 4.1 3.9  CL 102 89* 91* 96 95*  CO2 20 16* 23 30 28   GLUCOSE 192* 308* 189* 151* 113*  BUN 74* 95* 60* 32* 19  CREATININE 4.18* 5.26* 3.53* 2.51* 1.83*  CALCIUM 8.3* 8.3* 8.0* 8.1* 8.8  PHOS  --  6.0* 5.7* 3.9 2.4   Liver Function Tests:  Recent Labs Lab 01/04/13 2315 01/06/13 0450 01/07/13 0412 01/08/13 0420 01/09/13 0625  AST 25  --   --   --   --   ALT 18  --   --   --   --   ALKPHOS 99  --   --   --   --   BILITOT 0.2*  --   --   --   --   PROT 6.4  --   --   --   --   ALBUMIN 3.4* 3.7 3.6 3.6 3.4*   CBC:  Recent Labs Lab 01/04/13 2315 01/06/13 0500 01/07/13 1048  WBC 8.8 7.1 9.8  NEUTROABS 5.8  --   --   HGB 11.0* 10.2* 9.3*  HCT 33.4* 31.2* 28.0*  MCV 87.0 86.2 85.9  PLT 350 320 275   Cardiac Enzymes:  Recent Labs Lab 01/04/13 2315 01/05/13 0130 01/05/13 0825 01/05/13 1330  TROPONINI <0.30 <0.30 <0.30 <0.30   BNP (last 3 results)  Recent Labs  01/04/13 2315  PROBNP 16572.0*   CBG:  Recent Labs Lab 01/08/13 0334 01/08/13 0717 01/08/13 1640 01/08/13 2101 01/09/13 0636  GLUCAP 130* 122* 115* 134* 117*    Recent Results (from the past 240 hour(s))  MRSA PCR SCREENING     Status: None   Collection Time    01/05/13  2:02 AM      Result Value Range Status   MRSA by PCR NEGATIVE  NEGATIVE Final   Comment:            The GeneXpert MRSA Assay (FDA     approved for NASAL specimens     only), is one component of a     comprehensive MRSA colonization     surveillance program. It is not     intended to diagnose MRSA     infection nor to guide or     monitor treatment for     MRSA infections.  URINE CULTURE     Status: None   Collection Time    01/05/13  3:02 AM      Result Value Range Status   Specimen Description URINE, CATHETERIZED   Final   Special Requests NONE   Final   Culture  Setup Time     Final  Value: 01/05/2013 16:13     Performed at [facility] Count      Final   Value: NO GROWTH     Performed at [facility]   Culture     Final   Value: NO GROWTH     Performed at [facility]   Report Status 01/06/2013 FINAL   Final      Studies/Results: No results found.  Medications:  Scheduled: . allopurinol  100 mg Oral Daily  . amLODipine  10 mg Oral Daily  . aspirin EC  81 mg Oral Daily  . calcium acetate  667 mg Oral TID WC  . carvedilol  6.25 mg Oral BID WC  . darbepoetin (ARANESP) injection - DIALYSIS  60 mcg Intravenous Q Mon-HD  . enoxaparin (LOVENOX) injection  30 mg Subcutaneous Daily  . ferric gluconate (FERRLECIT/NULECIT) IV  125 mg Intravenous Q M,W,F-HD  . gabapentin  300 mg Oral QHS  . insulin aspart  0-5 Units Subcutaneous QHS  . insulin aspart  0-9 Units Subcutaneous TID WC  . insulin glargine  5 Units Subcutaneous Daily  . isosorbide mononitrate  60 mg Oral Daily  . levothyroxine  75 mcg Oral QAC breakfast  . multivitamin  1 tablet Oral QHS  . simvastatin  20 mg Oral q1800  . sodium chloride  3 mL Intravenous Q12H  . Travoprost (BAK Free)  1 drop Both Eyes QHS   Continuous: . 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 Underwent dialysis yesterday. Appears stable. IJ dialysis cathter placed by vascular. Nephrology feels patient may not be a good long term dialysis candidate. Patient states that she will go for dialysis if needed. No family at bedside. Attempts to reach family unsuccessful. Will call PMT per specialist recommendation.  DM 2 On SSI and low dose lantus. Was on Saxagliptin at home. A1C is 7.7.  Bradycardia  Improved off BB and once K was corrected with dialysis.  Acute on Chronic Diastolic CHF Normal systolic EF. Now on dialysis. Did not improve with lasix.   History of CAD Stable. Holding Plavix due to bleeding from HD catheter site. On Coreg and statin.  Hypothyroidism Prior h/o myxedema.TSH 4.2   PAD s/p left BKA in past Stable   Hyperkalemia Improved with dialysis   Anemia  Hb stable.   Code Status: Full Code DVT Prophylaxis: Lovenox Family Communication: Discussed with patient. No family available. Disposition Plan: SNF recommended. Also requested by family as not many resources at home. Requested PMT consult for GOC especially regarding HD. Anticipate discharge in 1-2 days once above issues clarified.    LOS: 5 days   [facility]  [facility] Pager [phone] 01/09/2013, 9:59 AM  If 8PM-8AM, please contact night-coverage  at [email or web address], password [facility]

## 2013-01-09 NOTE — Progress Notes (Signed)
Patient ID: [name], female   DOB: 02-Aug-1923, 79 y.o.   MRN: [id number]  [facility] Progress Note    Subjective:   Feels well, no c/o   Objective:   BP 166/72  Pulse 89  Temp(Src) 98.6 F (37 C) (Oral)  Resp 17  Ht 4\' 11"  (1.499 m)  Wt 57.8 kg (127 lb 6.8 oz)  BMI 25.72 kg/m2  SpO2 96%  Intake/Output: I/O last 3 completed shifts: In: 483 [P.O.:480; I.V.:3] Out: 1293 [Urine:200; Emesis/NG output:100; Other:993]   Intake/Output this shift:  Total I/O In: 220 [P.O.:220] Out: -  Weight change: -3.4 kg (-7 lb 7.9 oz)  Physical Exam: NFA:OZHYQMV AAF in NAd CVS:no rub Resp:cta HQI:ONGEXB Ext:no edema, s/p left BKA  Labs: BMET  Recent Labs Lab 01/04/13 2315 01/06/13 0450 01/07/13 0412 01/08/13 0420 01/09/13 0625  NA 137 127* 130* 137 136  K 5.3* 6.0* 4.7 4.1 3.9  CL 102 89* 91* 96 95*  CO2 20 16* 23 30 28   GLUCOSE 192* 308* 189* 151* 113*  BUN 74* 95* 60* 32* 19  CREATININE 4.18* 5.26* 3.53* 2.51* 1.83*  ALBUMIN 3.4* 3.7 3.6 3.6 3.4*  CALCIUM 8.3* 8.3* 8.0* 8.1* 8.8  PHOS  --  6.0* 5.7* 3.9 2.4   CBC  Recent Labs Lab 01/04/13 2315 01/06/13 0500 01/07/13 1048  WBC 8.8 7.1 9.8  NEUTROABS 5.8  --   --   HGB 11.0* 10.2* 9.3*  HCT 33.4* 31.2* 28.0*  MCV 87.0 86.2 85.9  PLT 350 320 275    @IMGRELPRIORS @ Medications:    . allopurinol  100 mg Oral Daily  . amLODipine  10 mg Oral Daily  . aspirin EC  81 mg Oral Daily  . calcium acetate  667 mg Oral TID WC  . carvedilol  6.25 mg Oral BID WC  . darbepoetin (ARANESP) injection - DIALYSIS  60 mcg Intravenous Q Mon-HD  . enoxaparin (LOVENOX) injection  30 mg Subcutaneous Daily  . ferric gluconate (FERRLECIT/NULECIT) IV  125 mg Intravenous Q M,W,F-HD  . gabapentin  300 mg Oral QHS  . insulin aspart  0-5 Units Subcutaneous QHS  . insulin aspart  0-9 Units Subcutaneous TID WC  . insulin glargine  5 Units Subcutaneous Daily  . isosorbide mononitrate  60 mg Oral Daily  .  levothyroxine  75 mcg Oral QAC breakfast  . multivitamin  1 tablet Oral QHS  . simvastatin  20 mg Oral q1800  . sodium chloride  3 mL Intravenous Q12H  . Travoprost (BAK Free)  1 drop Both Eyes QHS     Assessment/ Plan:   1. CHF/CMP- improved with HD and UF. HR has improved. 2. AKI/CKD stage 4, possibly now ESRD due to cardiorenal syndrome: cont with IHD and follow UOP and daily Scr. decreased urine output today and is s/p 3rd HD session yesterday. Agree that she is a poor longterm HD candidate and palliative care consult may be helpful to set goals/limits of care. She does not want to go to a SNF, however she is not strong enough to go home. Will resume lasix and follow UOP and dailyu Scr.  Hopefully she will start to see an improvement of her renal fxn now that volume status has improved. 3. Hyperkalemia: Improved post HD 4. Metabolic acidosis- improved post HD 5. Anemia: on IV Iron and ESA.  6. CKD-MBD: improving. On binders and follow 7. Hypothyroidism- prior h/o myxedema. TSH stable 8. Nutrition: improving 9. Hypertension:stable 10. Vascular access- new RIJ  PC. Dr. Arbie Cookey evaluated this as it was bleeding but has since stopped. For L AVG when stable per VVS 11. Dispo- pt does not want [place] to SNF. D/w family and agree with palliative care evaluation in an 79yo with CMP on HD.     Develle Sievers A 01/09/2013, 1:04 PM

## 2013-01-09 NOTE — Progress Notes (Signed)
Follow up Call back received from patient's daughter Suzette Battiest PMT meeting scheduled for tomorrow, Thursday @ 4:00 pm; she will make her sister aware of meeting time  Valente David, RN 01/09/2013, 1:37 PM Palliative Medicine Team RN Liaison [phone]

## 2013-01-09 NOTE — Progress Notes (Signed)
DAILY PROGRESS NOTE  Subjective:  No events overnight. Awaiting palliative care meeting with family tomorrow regarding disposition. BP remains elevated.   Objective:  Temp:  [97.7 F (36.5 C)-98.8 F (37.1 C)] 98 F (36.7 C) (10/22 1300) Pulse Rate:  [64-89] 78 (10/22 1300) Resp:  [12-18] 18 (10/22 1300) BP: (113-182)/(35-75) 149/73 mmHg (10/22 1300) SpO2:  [95 %-100 %] 96 % (10/22 1300) Weight:  [127 lb 6.8 oz (57.8 kg)-132 lb 0.9 oz (59.9 kg)] 127 lb 6.8 oz (57.8 kg) (10/22 1610) Weight change: -7 lb 7.9 oz (-3.4 kg)  Intake/Output from previous day: 10/21 0701 - 10/22 0700 In: 363 [P.O.:360; I.V.:3] Out: 1193 [Urine:200]  Intake/Output from this shift: Total I/O In: 220 [P.O.:220] Out: -   Medications: Current Facility-Administered Medications  Medication Dose Route Frequency Provider Last Rate Last Dose  . 0.9 %  sodium chloride infusion  250 mL Intravenous PRN Ron Parker, MD      . acetaminophen (TYLENOL) tablet 650 mg  650 mg Oral Q4H PRN Ron Parker, MD   650 mg at 01/06/13 2148  . albuterol (PROVENTIL,VENTOLIN) solution continuous neb  10 mg/hr Nebulization Continuous Loren Racer, MD   10 mg/hr at 01/04/13 2325  . allopurinol (ZYLOPRIM) tablet 100 mg  100 mg Oral Daily Ron Parker, MD   100 mg at 01/09/13 0905  . amLODipine (NORVASC) tablet 10 mg  10 mg Oral Daily Marykay Lex, MD   10 mg at 01/09/13 0908  . aspirin EC tablet 81 mg  81 mg Oral Daily Abelino Derrick, PA-C   81 mg at 01/09/13 9604  . calcium acetate (PHOSLO) capsule 667 mg  667 mg Oral TID WC Irena Cords, MD   667 mg at 01/09/13 0909  . carvedilol (COREG) tablet 6.25 mg  6.25 mg Oral BID WC Marykay Lex, MD   6.25 mg at 01/09/13 0908  . darbepoetin (ARANESP) injection 60 mcg  60 mcg Intravenous Q Mon-HD Irena Cords, MD   60 mcg at 01/07/13 1254  . enoxaparin (LOVENOX) injection 30 mg  30 mg Subcutaneous Daily Abelino Derrick, PA-C   30 mg at 01/09/13 5409   . ferric gluconate (NULECIT) 125 mg in sodium chloride 0.9 % 100 mL IVPB  125 mg Intravenous Q M,W,F-HD Irena Cords, MD   125 mg at 01/07/13 1254  . furosemide (LASIX) injection 40 mg  40 mg Intravenous BID Irena Cords, MD      . gabapentin (NEURONTIN) capsule 300 mg  300 mg Oral QHS Joseph Art, DO   300 mg at 01/08/13 2223  . HYDROcodone-acetaminophen (NORCO/VICODIN) 5-325 MG per tablet 1 tablet  1 tablet Oral Q6H PRN Joseph Art, DO   1 tablet at 01/07/13 0941  . HYDROmorphone (DILAUDID) injection 1 mg  1 mg Intravenous Q2H PRN Chrystie Nose, MD      . insulin aspart (novoLOG) injection 0-5 Units  0-5 Units Subcutaneous QHS Jessica U Vann, DO      . insulin aspart (novoLOG) injection 0-9 Units  0-9 Units Subcutaneous TID WC Joseph Art, DO   7 Units at 01/09/13 1152  . insulin glargine (LANTUS) injection 5 Units  5 Units Subcutaneous Daily Joseph Art, DO   5 Units at 01/09/13 0905  . isosorbide mononitrate (IMDUR) 24 hr tablet 60 mg  60 mg Oral Daily Ron Parker, MD   60 mg at 01/09/13 0905  . levalbuterol (XOPENEX)  nebulizer solution 0.63 mg  0.63 mg Nebulization Q6H PRN Leda Gauze, NP      . levothyroxine (SYNTHROID, LEVOTHROID) tablet 75 mcg  75 mcg Oral QAC breakfast Ron Parker, MD   75 mcg at 01/09/13 0905  . multivitamin (RENA-VIT) tablet 1 tablet  1 tablet Oral QHS Irena Cords, MD   1 tablet at 01/08/13 2301  . nitroGLYCERIN (NITROSTAT) SL tablet 0.4 mg  0.4 mg Sublingual Q5 min PRN Ron Parker, MD      . ondansetron [facility]) injection 4 mg  4 mg Intravenous Q6H PRN Ron Parker, MD   4 mg at 01/07/13 2151  . simvastatin (ZOCOR) tablet 20 mg  20 mg Oral q1800 Ron Parker, MD   20 mg at 01/08/13 1839  . sodium chloride 0.9 % injection 3 mL  3 mL Intravenous Q12H Ron Parker, MD   3 mL at 01/09/13 0912  . sodium chloride 0.9 % injection 3 mL  3 mL Intravenous PRN Ron Parker, MD      .  Travoprost (BAK Free) (TRAVATAN) 0.004 % ophthalmic solution SOLN 1 drop  1 drop Both Eyes QHS Ron Parker, MD   1 drop at 01/07/13 2133    Physical Exam: General appearance: alert and no distress Neck: no carotid bruit and no JVD Lungs: clear to auscultation bilaterally Heart: regular rate and rhythm, S1, S2 normal, no murmur, click, rub or gallop Abdomen: soft, non-tender; bowel sounds normal; no masses,  no organomegaly Extremities: Left BKA, right leg without significant edema Pulses: faint DP pulse on the right Skin: Skin color, texture, turgor normal. No rashes or lesions Neurologic: Mental status: Alert, oriented, thought content appropriate  Lab Results: Results for orders placed during the hospital encounter of 01/04/13 (from the past 48 hour(s))  GLUCOSE, CAPILLARY     Status: Abnormal   Collection Time    01/07/13  5:18 PM      Result Value Range   Glucose-Capillary 160 (*) 70 - 99 mg/dL  GLUCOSE, CAPILLARY     Status: Abnormal   Collection Time    01/07/13  8:16 PM      Result Value Range   Glucose-Capillary 182 (*) 70 - 99 mg/dL  GLUCOSE, CAPILLARY     Status: None   Collection Time    01/08/13 12:09 AM      Result Value Range   Glucose-Capillary 90  70 - 99 mg/dL   Comment 1 Documented in Chart     Comment 2 Notify RN    GLUCOSE, CAPILLARY     Status: Abnormal   Collection Time    01/08/13  3:34 AM      Result Value Range   Glucose-Capillary 130 (*) 70 - 99 mg/dL   Comment 1 Documented in Chart     Comment 2 Notify RN    RENAL FUNCTION PANEL     Status: Abnormal   Collection Time    01/08/13  4:20 AM      Result Value Range   Sodium 137  135 - 145 mEq/L   Potassium 4.1  3.5 - 5.1 mEq/L   Chloride 96  96 - 112 mEq/L   CO2 30  19 - 32 mEq/L   Glucose, Bld 151 (*) 70 - 99 mg/dL   BUN 32 (*) 6 - 23 mg/dL   Creatinine, Ser 4.78 (*) 0.50 - 1.10 mg/dL   Calcium 8.1 (*) 8.4 - 10.5 mg/dL   Phosphorus  3.9  2.3 - 4.6 mg/dL   Albumin 3.6  3.5 - 5.2 g/dL     GFR calc non Af Amer 16 (*) >90 mL/min   GFR calc Af Amer 19 (*) >90 mL/min   Comment: (NOTE)     The eGFR has been calculated using the CKD EPI equation.     This calculation has not been validated in all clinical situations.     eGFR's persistently <90 mL/min signify possible Chronic Kidney     Disease.  GLUCOSE, CAPILLARY     Status: Abnormal   Collection Time    01/08/13  7:17 AM      Result Value Range   Glucose-Capillary 122 (*) 70 - 99 mg/dL  GLUCOSE, CAPILLARY     Status: Abnormal   Collection Time    01/08/13  4:40 PM      Result Value Range   Glucose-Capillary 115 (*) 70 - 99 mg/dL   Comment 1 Notify RN    GLUCOSE, CAPILLARY     Status: Abnormal   Collection Time    01/08/13  9:01 PM      Result Value Range   Glucose-Capillary 134 (*) 70 - 99 mg/dL  RENAL FUNCTION PANEL     Status: Abnormal   Collection Time    01/09/13  6:25 AM      Result Value Range   Sodium 136  135 - 145 mEq/L   Potassium 3.9  3.5 - 5.1 mEq/L   Comment: HEMOLYSIS AT THIS LEVEL MAY AFFECT RESULT   Chloride 95 (*) 96 - 112 mEq/L   CO2 28  19 - 32 mEq/L   Glucose, Bld 113 (*) 70 - 99 mg/dL   BUN 19  6 - 23 mg/dL   Creatinine, Ser 8.11 (*) 0.50 - 1.10 mg/dL   Calcium 8.8  8.4 - 91.4 mg/dL   Phosphorus 2.4  2.3 - 4.6 mg/dL   Albumin 3.4 (*) 3.5 - 5.2 g/dL   GFR calc non Af Amer 24 (*) >90 mL/min   GFR calc Af Amer 27 (*) >90 mL/min   Comment: (NOTE)     The eGFR has been calculated using the CKD EPI equation.     This calculation has not been validated in all clinical situations.     eGFR's persistently <90 mL/min signify possible Chronic Kidney     Disease.  GLUCOSE, CAPILLARY     Status: Abnormal   Collection Time    01/09/13  6:36 AM      Result Value Range   Glucose-Capillary 117 (*) 70 - 99 mg/dL  GLUCOSE, CAPILLARY     Status: Abnormal   Collection Time    01/09/13 10:52 AM      Result Value Range   Glucose-Capillary 312 (*) 70 - 99 mg/dL    Imaging: No results  found.  Assessment:  1. Principal Problem: 2.   Acute respiratory failure 3. Active Problems: 4.   CAD - moderate at cath July 2012 (medical Rx) 5.   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   Hypothyroid 7.   Peripheral vascular disease, hx Lt BKA, known RLE disease 8.   Acute on chronic diastolic CHF  9.   Hyperkalemia 01/04/13 10.   Anemia 11.   Acute on chronic renal failure 01/04/13 12.   Diastolic dysfunction 13.   Dyslipidemia 14.   Chronic renal insufficiency, stage IV (severe)- HD started 01/05/13 15.   Bradycardia on admission 01/04/13 16.   Diabetes  mellitus type 2 with peripheral artery disease 17.   Plan:  1. BP remains elevated. Recommend adding hydralazine 25 mg TID to regimen. Plan for family meeting with palliative care tomorrow.   Time Spent Directly with Patient:  15 minutes  Length of Stay:  LOS: 5 days   Chrystie Nose, MD, [facility] Attending Cardiologist [facility]  Jayleene Glaeser C 01/09/2013, 2:59 PM

## 2013-01-10 DIAGNOSIS — I129 Hypertensive chronic kidney disease with stage 1 through stage 4 chronic kidney disease, or unspecified chronic kidney disease: Secondary | ICD-10-CM

## 2013-01-10 LAB — GLUCOSE, CAPILLARY
Glucose-Capillary: 129 mg/dL — ABNORMAL HIGH (ref 70–99)
Glucose-Capillary: 238 mg/dL — ABNORMAL HIGH (ref 70–99)
Glucose-Capillary: 154 mg/dL — ABNORMAL HIGH (ref 70–99)
Glucose-Capillary: 120 mg/dL — ABNORMAL HIGH (ref 70–99)

## 2013-01-10 LAB — RENAL FUNCTION PANEL
Albumin: 3.4 g/dL — ABNORMAL LOW (ref 3.5–5.2)
CO2: 29 mEq/L (ref 19–32)
Calcium: 8.9 mg/dL (ref 8.4–10.5)
Chloride: 92 mEq/L — ABNORMAL LOW (ref 96–112)
Creatinine, Ser: 2.17 mg/dL — ABNORMAL HIGH (ref 0.50–1.10)
GFR calc Af Amer: 22 mL/min — ABNORMAL LOW (ref >90)
Glucose, Bld: 131 mg/dL — ABNORMAL HIGH (ref 70–99)

## 2013-01-10 NOTE — Progress Notes (Signed)
Subjective: No complaints.  Objective: Vital signs in last 24 hours: Temp:  [98 F (36.7 C)-98.4 F (36.9 C)] 98.2 F (36.8 C) (10/23 0554) Pulse Rate:  [74-98] 98 (10/23 0831) Resp:  [16-18] 17 (10/23 0554) BP: (141-167)/(53-92) 167/92 mmHg (10/23 0831) SpO2:  [94 %-96 %] 96 % (10/23 0554) Weight:  [131 lb 3.2 oz (59.512 kg)] 131 lb 3.2 oz (59.512 kg) (10/23 0554) Last BM Date: 01/08/13  Intake/Output from previous day: 10/22 0701 - 10/23 0700 In: 978 [P.O.:978] Out: 1400 [Urine:1400] Intake/Output this shift: Total I/O In: -  Out: 150 [Urine:150]  Medications Current Facility-Administered Medications  Medication Dose Route Frequency Provider Last Rate Last Dose  . 0.9 %  sodium chloride infusion  250 mL Intravenous PRN Ron Parker, MD      . acetaminophen (TYLENOL) tablet 650 mg  650 mg Oral Q4H PRN Ron Parker, MD   650 mg at 01/06/13 2148  . albuterol (PROVENTIL,VENTOLIN) solution continuous neb  10 mg/hr Nebulization Continuous Loren Racer, MD   10 mg/hr at 01/04/13 2325  . allopurinol (ZYLOPRIM) tablet 100 mg  100 mg Oral Daily Ron Parker, MD   100 mg at 01/10/13 0921  . amLODipine (NORVASC) tablet 10 mg  10 mg Oral Daily Marykay Lex, MD   10 mg at 01/10/13 0102  . aspirin EC tablet 81 mg  81 mg Oral Daily Abelino Derrick, PA-C   81 mg at 01/10/13 7253  . calcium acetate (PHOSLO) capsule 667 mg  667 mg Oral TID WC Irena Cords, MD   667 mg at 01/10/13 6644  . carvedilol (COREG) tablet 6.25 mg  6.25 mg Oral BID WC Marykay Lex, MD   6.25 mg at 01/10/13 0347  . darbepoetin (ARANESP) injection 60 mcg  60 mcg Intravenous Q Mon-HD Irena Cords, MD   60 mcg at 01/07/13 1254  . enoxaparin (LOVENOX) injection 30 mg  30 mg Subcutaneous Daily Abelino Derrick, PA-C   30 mg at 01/10/13 4259  . ferric gluconate (NULECIT) 125 mg in sodium chloride 0.9 % 100 mL IVPB  125 mg Intravenous Q M,W,F-HD Irena Cords, MD   125 mg at  01/07/13 1254  . furosemide (LASIX) injection 40 mg  40 mg Intravenous BID Irena Cords, MD   40 mg at 01/10/13 5638  . gabapentin (NEURONTIN) capsule 300 mg  300 mg Oral QHS Joseph Art, DO   300 mg at 01/09/13 2215  . hydrALAZINE (APRESOLINE) tablet 25 mg  25 mg Oral Q8H Chrystie Nose, MD   25 mg at 01/10/13 0618  . HYDROcodone-acetaminophen (NORCO/VICODIN) 5-325 MG per tablet 1 tablet  1 tablet Oral Q6H PRN Joseph Art, DO   1 tablet at 01/07/13 0941  . HYDROmorphone (DILAUDID) injection 1 mg  1 mg Intravenous Q2H PRN Chrystie Nose, MD      . insulin aspart (novoLOG) injection 0-5 Units  0-5 Units Subcutaneous QHS Jessica U Vann, DO      . insulin aspart (novoLOG) injection 0-9 Units  0-9 Units Subcutaneous TID WC Joseph Art, DO   1 Units at 01/10/13 7564  . insulin glargine (LANTUS) injection 5 Units  5 Units Subcutaneous Daily Joseph Art, DO   5 Units at 01/10/13 3329  . isosorbide mononitrate (IMDUR) 24 hr tablet 60 mg  60 mg Oral Daily Ron Parker, MD   60 mg at 01/10/13 0921  . levalbuterol (XOPENEX)  nebulizer solution 0.63 mg  0.63 mg Nebulization Q6H PRN Leda Gauze, NP      . levothyroxine (SYNTHROID, LEVOTHROID) tablet 75 mcg  75 mcg Oral QAC breakfast Ron Parker, MD   75 mcg at 01/10/13 9147  . multivitamin (RENA-VIT) tablet 1 tablet  1 tablet Oral QHS Irena Cords, MD   1 tablet at 01/09/13 2215  . nitroGLYCERIN (NITROSTAT) SL tablet 0.4 mg  0.4 mg Sublingual Q5 min PRN Ron Parker, MD      . ondansetron [facility]) injection 4 mg  4 mg Intravenous Q6H PRN Ron Parker, MD   4 mg at 01/07/13 2151  . simvastatin (ZOCOR) tablet 20 mg  20 mg Oral q1800 Ron Parker, MD   20 mg at 01/09/13 1751  . sodium chloride 0.9 % injection 3 mL  3 mL Intravenous Q12H Ron Parker, MD   3 mL at 01/10/13 0924  . sodium chloride 0.9 % injection 3 mL  3 mL Intravenous PRN Ron Parker, MD   3 mL at 01/09/13 2216  .  Travoprost (BAK Free) (TRAVATAN) 0.004 % ophthalmic solution SOLN 1 drop  1 drop Both Eyes QHS Ron Parker, MD   1 drop at 01/09/13 2215    PE: General appearance: alert, cooperative and no distress Lungs: clear to auscultation bilaterally and faint crackles at LLL Heart: regular rate and rhythm Extremities: no LEE Pulses: 2+ and symmetric Skin: warm and dry Neurologic: Grossly normal  Lab Results:  No results found for this basename: WBC, HGB, HCT, PLT,  in the last 72 hours BMET  Recent Labs  01/08/13 0420 01/09/13 0625 01/10/13 0500  NA 137 136 133*  K 4.1 3.9 3.3*  CL 96 95* 92*  CO2 30 28 29   GLUCOSE 151* 113* 131*  BUN 32* 19 26*  CREATININE 2.51* 1.83* 2.17*  CALCIUM 8.1* 8.8 8.9    Assessment/Plan  Principal Problem:   Acute respiratory failure Active Problems:   CAD - moderate at cath July 2012 (medical Rx)   Myxedema cardiomyopathy, 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 Most recent SBP is 167. HR is in the upper 60s -low 70s. ? Either increasing coreg vs increasing hydralazine. MD to follow with recommendation. Palliative Care still to meet with family later today.     LOS: 6 days    Brittainy M. Sharol Harness, PA-C 01/10/2013 11:02 AM  I have seen and examined the patient along with Brittainy M. Sharol Harness, PA-C.  I have reviewed the chart, notes and new data.  I agree with PA's note.  Key new complaints: no cardiac complaints Key examination changes:  elevated BP persists   PLAN: Increase carvedilol to 12.5 mg BID.  Thurmon Fair, MD, [facility] [phone] 01/10/2013, 2:05 PM

## 2013-01-10 NOTE — Progress Notes (Signed)
Pt resting comfortable on bed, denies any pain or discomfort at this time. Family member at the bedside

## 2013-01-10 NOTE — Progress Notes (Signed)
Physical Therapy Treatment Patient Details Name: [name] MRN: [id number] DOB: 1923/07/14 Today's Date: 01/10/2013 Time: 1026-1050 PT Time Calculation (min): 24 min  PT Assessment / Plan / Recommendation  History of Present Illness [name] is a 79 y.o. female with Multiple Medical Problems including L BKA, CAD, CHF, Myxedema Cardiomyopathy and CKD Stage III- V, who was brought to the ED with complaints of worsening SOB and Wheezing . Acute renal failure with initiation of HD.   PT Comments   Pt requiring MIN A for squat pivot transfer.  Pt seems to have poor insight into her current level of mobility.  Pt feels she can care for herself at home without family assistance.  Discussed with patient at fair length.  Pt should be able to be d/c home with family support and family A for transfers at current level with HHPT.  Pt does not want to go to SNF for short term rehab.    Follow Up Recommendations  Supervision for mobility/OOB;Home health PT;SNF     Does the patient have the potential to tolerate intense rehabilitation     Barriers to Discharge        Equipment Recommendations  None recommended by PT    Recommendations for Other Services    Frequency Min 3X/week   Progress towards PT Goals Progress towards PT goals: Progressing toward goals  Plan Current plan remains appropriate (as long as family can A at home)    Precautions / Restrictions Precautions Precautions: Fall Precaution Comments: L BKA Restrictions Weight Bearing Restrictions: No   Pertinent Vitals/Pain No c/o pain    Mobility  Bed Mobility Bed Mobility: Supine to Sit Supine to Sit: 5: Supervision;HOB flat;With rails Details for Bed Mobility Assistance: heavy use of rail Transfers Transfers: Squat Pivot Transfers Squat Pivot Transfers: 4: Min assist;With armrests Details for Transfer Assistance: pt needed cueing for safe technique.  Pt was able to scoot back better  today. Ambulation/Gait Ambulation/Gait Assistance: Not tested (comment)    Exercises General Exercises - Lower Extremity Ankle Circles/Pumps: Right;10 reps Long Arc Quad: Strengthening;Both;10 reps;Seated Hip ABduction/ADduction: Strengthening;Both;10 reps;Seated Hip Flexion/Marching: Both;AAROM;10 reps;Seated Other Exercises Other Exercises: chair push-ups x 10 reps   PT Diagnosis:    PT Problem List:   PT Treatment Interventions:     PT Goals (current goals can now be found in the care plan section) Acute Rehab PT Goals Patient Stated Goal: return home  Visit Information  Last PT Received On: 01/10/13 Assistance Needed: +1 History of Present Illness: [name] is a 79 y.o. female with Multiple Medical Problems including L BKA, CAD, CHF, Myxedema Cardiomyopathy and CKD Stage III- V, who was brought to the ED with complaints of worsening SOB and Wheezing . Acute renal failure with initiation of HD.    Subjective Data  Subjective: "I think I can take care of myself." Patient Stated Goal: return home   Cognition  Cognition Arousal/Alertness: Awake/alert Behavior During Therapy: WFL for tasks assessed/performed Overall Cognitive Status: Within Functional Limits for tasks assessed    Balance  Static Sitting Balance Static Sitting - Balance Support: Feet unsupported;Right upper extremity supported Static Sitting - Level of Assistance: 6: Modified independent (Device/Increase time)  End of Session PT - End of Session Equipment Utilized During Treatment: Gait belt Activity Tolerance: Patient tolerated treatment well Patient left: in chair;with call bell/phone within reach Nurse Communication:  (Nursing okayed geting pt up.)   GP     Alexzander Dolinger LUBECK 01/10/2013, 11:05 AM

## 2013-01-10 NOTE — Progress Notes (Signed)
Patient ID: [name], female   DOB: 1923/12/19, 79 y.o.   MRN: [id number] S:no new complaints O:BP 167/92  Pulse 98  Temp(Src) 98.2 F (36.8 C) (Oral)  Resp 17  Ht 4\' 11"  (1.499 m)  Wt 59.512 kg (131 lb 3.2 oz)  BMI 26.49 kg/m2  SpO2 96%  Intake/Output Summary (Last 24 hours) at 01/10/13 1039 Last data filed at 01/10/13 0939  Gross per 24 hour  Intake    758 ml  Output   1550 ml  Net   -792 ml   Intake/Output: I/O last 3 completed shifts: In: 981 [P.O.:978; I.V.:3] Out: 1600 [Urine:1600]  Intake/Output this shift:  Total I/O In: -  Out: 150 [Urine:150] Weight change: -1.488 kg (-3 lb 4.5 oz) Gen:WD WN AAF in NAD CVS:no rub Resp:CTA OZH:YQMVHQ Ext:no edema   Recent Labs Lab 01/04/13 2315 01/06/13 0450 01/07/13 0412 01/08/13 0420 01/09/13 0625 01/10/13 0500  NA 137 127* 130* 137 136 133*  K 5.3* 6.0* 4.7 4.1 3.9 3.3*  CL 102 89* 91* 96 95* 92*  CO2 20 16* 23 30 28 29   GLUCOSE 192* 308* 189* 151* 113* 131*  BUN 74* 95* 60* 32* 19 26*  CREATININE 4.18* 5.26* 3.53* 2.51* 1.83* 2.17*  ALBUMIN 3.4* 3.7 3.6 3.6 3.4* 3.4*  CALCIUM 8.3* 8.3* 8.0* 8.1* 8.8 8.9  PHOS  --  6.0* 5.7* 3.9 2.4 2.5  AST 25  --   --   --   --   --   ALT 18  --   --   --   --   --    Liver Function Tests:  Recent Labs Lab 01/04/13 2315  01/08/13 0420 01/09/13 0625 01/10/13 0500  AST 25  --   --   --   --   ALT 18  --   --   --   --   ALKPHOS 99  --   --   --   --   BILITOT 0.2*  --   --   --   --   PROT 6.4  --   --   --   --   ALBUMIN 3.4*  < > 3.6 3.4* 3.4*  < > = values in this interval not displayed. No results found for this basename: LIPASE, AMYLASE,  in the last 168 hours No results found for this basename: AMMONIA,  in the last 168 hours CBC:  Recent Labs Lab 01/04/13 2315 01/06/13 0500 01/07/13 1048  WBC 8.8 7.1 9.8  NEUTROABS 5.8  --   --   HGB 11.0* 10.2* 9.3*  HCT 33.4* 31.2* 28.0*  MCV 87.0 86.2 85.9  PLT 350 320 275   Cardiac Enzymes:  Recent  Labs Lab 01/04/13 2315 01/05/13 0130 01/05/13 0825 01/05/13 1330  TROPONINI <0.30 <0.30 <0.30 <0.30   CBG:  Recent Labs Lab 01/09/13 0636 01/09/13 1052 01/09/13 1557 01/09/13 2101 01/10/13 0544  GLUCAP 117* 312* 103* 161* 129*    Iron Studies: No results found for this basename: IRON, TIBC, TRANSFERRIN, FERRITIN,  in the last 72 hours Studies/Results: No results found. Marland Kitchen allopurinol  100 mg Oral Daily  . amLODipine  10 mg Oral Daily  . aspirin EC  81 mg Oral Daily  . calcium acetate  667 mg Oral TID WC  . carvedilol  6.25 mg Oral BID WC  . darbepoetin (ARANESP) injection - DIALYSIS  60 mcg Intravenous Q Mon-HD  . enoxaparin (LOVENOX) injection  30 mg Subcutaneous Daily  .  ferric gluconate (FERRLECIT/NULECIT) IV  125 mg Intravenous Q M,W,F-HD  . furosemide  40 mg Intravenous BID  . gabapentin  300 mg Oral QHS  . hydrALAZINE  25 mg Oral Q8H  . insulin aspart  0-5 Units Subcutaneous QHS  . insulin aspart  0-9 Units Subcutaneous TID WC  . insulin glargine  5 Units Subcutaneous Daily  . isosorbide mononitrate  60 mg Oral Daily  . levothyroxine  75 mcg Oral QAC breakfast  . multivitamin  1 tablet Oral QHS  . simvastatin  20 mg Oral q1800  . sodium chloride  3 mL Intravenous Q12H  . Travoprost (BAK Free)  1 drop Both Eyes QHS    BMET    Component Value Date/Time   NA 133* 01/10/2013 0500   K 3.3* 01/10/2013 0500   CL 92* 01/10/2013 0500   CO2 29 01/10/2013 0500   GLUCOSE 131* 01/10/2013 0500   BUN 26* 01/10/2013 0500   CREATININE 2.17* 01/10/2013 0500   CREATININE 2.70* 01/01/2013 0951   CALCIUM 8.9 01/10/2013 0500   CALCIUM 9.6 10/11/2010 1605   GFRNONAA 19* 01/10/2013 0500   GFRAA 22* 01/10/2013 0500   CBC    Component Value Date/Time   WBC 9.8 01/07/2013 1048   RBC 3.26* 01/07/2013 1048   HGB 9.3* 01/07/2013 1048   HCT 28.0* 01/07/2013 1048   PLT 275 01/07/2013 1048   MCV 85.9 01/07/2013 1048   MCH 28.5 01/07/2013 1048   MCHC 33.2 01/07/2013 1048    RDW 16.0* 01/07/2013 1048   LYMPHSABS 1.8 01/04/2013 2315   MONOABS 0.7 01/04/2013 2315   EOSABS 0.5 01/04/2013 2315   BASOSABS 0.0 01/04/2013 2315     Assessment/Plan: 1. CHF/CMP- improved with HD and UF. HR has improved. 2. AKI/CKD stage 4, possibly now ESRD due to cardiorenal syndrome: cont with IHD and follow UOP and daily Scr. decreased urine output today and is s/p 3rd HD session yesterday. Agree that she is a poor longterm HD candidate and palliative care consult may be helpful to set goals/limits of care. She does not want to go to a SNF, however she is not strong enough to go home. Will resume lasix and follow UOP and dailyu Scr. Hopefully she will start to see an improvement of her renal fxn now that volume status has improved. 3. Hyperkalemia: Improved post HD 4. Metabolic acidosis- improved post HD 5. Anemia: on IV Iron and ESA.  6. CKD-MBD: improving. On binders and follow 7. Hypothyroidism- prior h/o myxedema. TSH stable 8. Nutrition: improving 9. Hypertension:stable 10. Vascular access- new RIJ PC. Dr. Arbie Cookey evaluated this as it was bleeding but has since stopped. For L AVG when stable per VVS 11. Dispo- pt does not want [place] to SNF. D/w family and agree with palliative care evaluation in an 79yo with CMP on HD. Scheduled for today.  If they want more aggressive therapy will ask Dr. Arbie Cookey to place AVG this hospitalization.      Riyad Keena A

## 2013-01-10 NOTE — Consult Note (Addendum)
Palliative Medicine Team at [facility]  Date: 01/10/2013   Patient Name: [name]  DOB: 10-01-1923  MRN: [id number]  Age / Sex: 79 y.o., female   PCP: Georgianne Fick, MD Referring Physician: Osvaldo Shipper, MD  HPI/Reason for Consultation: Ms. Vosler is an 79 yo woman with ESRD Stage IV, DM, Severe PAD with left BKA, CAD, Diastolic CHF admitted with severe dyspnea and volume overload who received urgent HD for acute on chronic renal failure. She responded well to HD symptomatically and tolerated well but given her age, baseline poor functional status, and multiple comorbidities she is probably not a good candidate for long term HD. PMT asked to determine goals of care.  Participants in Discussion: Patient, her two daughter's, one grandson. Her son is hospitalized with a stroke here at [facility] and was unable to attend meeting.  Goals/Summary of Discussion:  1. Code Status:  DNR, this was confirmed and patient agreed.  2. Scope of Treatment:  Patient desires aggressive medical treatment for reversible problems and management of her chronic medical problems to continue. She does not however desire uncomfortable or difficult interventions such as HD, surgery or any other high risk medical intervention to be started or continued after this hospitalization. Optimize her renal and cardiac status as much as possible.  I discussed with them at length the issue of HD- I made a strong recommendation against HD based on her poor prognostic indicators ie. Functional status, CAD and heart failure-dialysis this isnt something that they think she will tolerate well and really don't want to put her through that whole process. I suspect she has been living for a long tiome with bad kidneys and with our interventions to get her close to a dry weight and in better condition that she may actually not have a rapid decline without HD long term.   Overall comfort and QOL are her primary goals.    3.  Assessment/Plan:  Primary Diagnoses  1. ESRD 2. Diastolic CHF, EF 14-78%-GNFAOZ overloaded 3. DM2 4. CAD 5. S/p BKA with PAD  Prognosis : <6 months, she would likely meet criteria for Hospice  PPS 30%   Active Symptoms 1. Dyspnea  Continue aggressive diuresis, HD while inpatient   Prn SL hydromorphone 2. Leg Pain  PRN hydromorphone 3. Immobility  Wants to attempt rehab-at baseline she could go from bed to [facility] with minimal assistance PTA. 4. Failure to thrive  Related to multiple comorbidities 5. Weakness  Related to chronic diseases, PT as much as possible.  4. Palliative Prophylaxis:   Bowel Regimen -yes  Terminal Secretions-NA  Breakthrough Pain and Dyspnea-yes   Agitation and Delirium-yes  Nausea-yes  5. Psychosocial Spiritual Asssessment/Interventions:  Patient and Family Adjustment to Illness/Prognosis: Family handling decision making very well. All of their questions were answered and concerns addressed.  Spiritual Concerns or Needs: none currently  6. Disposition:  Patient and family would like for her to go to short term rehab, while her son (in hospital with stroke) improves-he lived with her and was her primary caregiver. Would like PCS to follow at SNF and transition to hospice care after SNF rehab.  Educational Materials Given:  DNR: Yes  MOST: No Healthcare Power-of-Attorney: No- will request these be addressed   Time: 70 min Greater than 50%  of this time was spent counseling and coordinating care related to the above assessment and plan.  Signed by: Edsel Petrin, DO  01/10/2013, 4:58 PM  Please contact Palliative Medicine Team phone at  [phone] for questions and concerns.

## 2013-01-10 NOTE — Progress Notes (Signed)
[facility] PROGRESS NOTE  [name]:[id number] DOB: [birth year] DOA: 01/04/2013  PCP: Georgianne Fick, MD  Brief HPI: [name] is a 79 y.o. female with Multiple Medical Problems including CAD, CHF, Myxedema Cardiomyopathy and CKD Stage III- V, who was brought to the ED with complaints of worsening SOB and Wheezing. EMS was called to her home and found her O2 sats to be 70% and she was placed on supplemental O2, and had mild improvement. She was found to have findings of CHF. She has acute on chronic renal failure. She did not diurese with Lasix and required initiation of dialysis after a temp dialysis catheter was placed. She is now stable.   Past medical history:  Past Medical History  Diagnosis Date  . Coronary artery disease   . TIA (transient ischemic attack)   . Hypertension   . Shingles   . Stroke   . Anemia   . CAD (coronary artery disease) 02/08/2011  . Cardiomyopathy, idiopathic 02/08/2011  . Chronic kidney disease   . Angina     Takes Isosorbide  . Unstable angina 02/08/2011  . Myocardial infarct     x 3 unsure of years  . Irregular heartbeat   . Ulcer   . GERD (gastroesophageal reflux disease)     takes Protonix and zantac  . Hx of transient ischemic attack (TIA)   . Memory loss   . CHF (congestive heart failure)     Takes Lasix  . Gout     takes allopurinol  . Peripheral vascular disease     left lower  leg  . Diabetes mellitus     type 2 NIDDM x 2 years; no meds  . Chronic kidney disease (CKD), stage IV (severe)   . Arthritis   . Shortness of breath   . Thyroid disease   . Hypothyroidism     (SEVERE) Takes Levothryroxine  . Hypothyroid 02/08/2011  . Nonischemic cardiomyopathy     EF now is 55%, reduced due to myxedema, which is improved.  . Dyslipidemia   . Peripheral neuropathy   . H/O echocardiogram 09/06/11    Indication- nonIschemic Cardiomyopathy. EF = now greater than 55% with no regional wall motion abnormalities. Tthere  is mild to moderate trisuspid regurgitayion and mild pulmonary hypertension with an RVSP of 35 mmHg as well as stage 1 diastolic dysfunction and mild to moderate LVH.  Marland Kitchen Abnormal nuclear stress test 06/01/09    Demonstrated a new area of infarct scar, peri-infarct ischemia seen in the inferolateral territory. EF eas normal at 70% with mild hypocontractility at the apex, distal inferolateral wall.    Consultants:  Vascular  Cards  nephro  Procedures:  HD  Right IJ HD cath- 10/19  Antibiotics:  None  Subjective: Patrent continues to do well. Very reluctant about going to SNF but daughter has requested same. Denies any chest pain or shortness of breath.  Objective: Vital Signs  Filed Vitals:   01/09/13 1747 01/09/13 2131 01/10/13 0554 01/10/13 0831  BP: 152/67 141/53 147/65 167/92  Pulse: 82 74 75 98  Temp:  98.4 F (36.9 C) 98.2 F (36.8 C)   TempSrc:  Oral Oral   Resp:  16 17   Height:      Weight:   59.512 kg (131 lb 3.2 oz)   SpO2:  94% 96%     Filed Weights   01/08/13 1600 01/09/13 0613 01/10/13 0554  Weight: 59.194 kg (130 lb 8 oz) 57.8 kg (127 lb 6.8  oz) 59.512 kg (131 lb 3.2 oz)    Intake/Output from previous day: 10/22 0701 - 10/23 0700 In: 978 [P.O.:978] Out: 1400 [Urine:1400]  General appearance: alert, cooperative, appears stated age and no distress Resp: diminished air entry at bases. No crackles or wheezing. Cardio: regular rate and rhythm, S1, S2 normal, no murmur, click, rub or gallop GI: soft, non-tender; bowel sounds normal; no masses,  no organomegaly Extremities: extremities normal, atraumatic, no cyanosis or edema and left BKA Neurologic: alert and oriented x 3. No focal deficits.  Lab Results:  Basic Metabolic Panel:  Recent Labs Lab 01/06/13 0450 01/07/13 0412 01/08/13 0420 01/09/13 0625 01/10/13 0500  NA 127* 130* 137 136 133*  K 6.0* 4.7 4.1 3.9 3.3*  CL 89* 91* 96 95* 92*  CO2 16* 23 30 28 29   GLUCOSE 308* 189* 151* 113* 131*   BUN 95* 60* 32* 19 26*  CREATININE 5.26* 3.53* 2.51* 1.83* 2.17*  CALCIUM 8.3* 8.0* 8.1* 8.8 8.9  PHOS 6.0* 5.7* 3.9 2.4 2.5   Liver Function Tests:  Recent Labs Lab 01/04/13 2315 01/06/13 0450 01/07/13 0412 01/08/13 0420 01/09/13 0625 01/10/13 0500  AST 25  --   --   --   --   --   ALT 18  --   --   --   --   --   ALKPHOS 99  --   --   --   --   --   BILITOT 0.2*  --   --   --   --   --   PROT 6.4  --   --   --   --   --   ALBUMIN 3.4* 3.7 3.6 3.6 3.4* 3.4*   CBC:  Recent Labs Lab 01/04/13 2315 01/06/13 0500 01/07/13 1048  WBC 8.8 7.1 9.8  NEUTROABS 5.8  --   --   HGB 11.0* 10.2* 9.3*  HCT 33.4* 31.2* 28.0*  MCV 87.0 86.2 85.9  PLT 350 320 275   Cardiac Enzymes:  Recent Labs Lab 01/04/13 2315 01/05/13 0130 01/05/13 0825 01/05/13 1330  TROPONINI <0.30 <0.30 <0.30 <0.30   BNP (last 3 results)  Recent Labs  01/04/13 2315  PROBNP 16572.0*   CBG:  Recent Labs Lab 01/09/13 0636 01/09/13 1052 01/09/13 1557 01/09/13 2101 01/10/13 0544  GLUCAP 117* 312* 103* 161* 129*    Recent Results (from the past 240 hour(s))  MRSA PCR SCREENING     Status: None   Collection Time    01/05/13  2:02 AM      Result Value Range Status   MRSA by PCR NEGATIVE  NEGATIVE Final   Comment:            The GeneXpert MRSA Assay (FDA     approved for NASAL specimens     only), is one component of a     comprehensive MRSA colonization     surveillance program. It is not     intended to diagnose MRSA     infection nor to guide or     monitor treatment for     MRSA infections.  URINE CULTURE     Status: None   Collection Time    01/05/13  3:02 AM      Result Value Range Status   Specimen Description URINE, CATHETERIZED   Final   Special Requests NONE   Final   Culture  Setup Time     Final   Value: 01/05/2013 16:13  Performed at [facility] Count     Final   Value: NO GROWTH     Performed at [facility]   Culture     Final    Value: NO GROWTH     Performed at [facility]   Report Status 01/06/2013 FINAL   Final      Studies/Results: No results found.  Medications:  Scheduled: . allopurinol  100 mg Oral Daily  . amLODipine  10 mg Oral Daily  . aspirin EC  81 mg Oral Daily  . calcium acetate  667 mg Oral TID WC  . carvedilol  6.25 mg Oral BID WC  . darbepoetin (ARANESP) injection - DIALYSIS  60 mcg Intravenous Q Mon-HD  . enoxaparin (LOVENOX) injection  30 mg Subcutaneous Daily  . ferric gluconate (FERRLECIT/NULECIT) IV  125 mg Intravenous Q M,W,F-HD  . furosemide  40 mg Intravenous BID  . gabapentin  300 mg Oral QHS  . hydrALAZINE  25 mg Oral Q8H  . insulin aspart  0-5 Units Subcutaneous QHS  . insulin aspart  0-9 Units Subcutaneous TID WC  . insulin glargine  5 Units Subcutaneous Daily  . isosorbide mononitrate  60 mg Oral Daily  . levothyroxine  75 mcg Oral QAC breakfast  . multivitamin  1 tablet Oral QHS  . simvastatin  20 mg Oral q1800  . sodium chloride  3 mL Intravenous Q12H  . Travoprost (BAK Free)  1 drop Both Eyes QHS   Continuous: . 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 Underwent  dialysis 10/21. Appears stable. No plans for dialysis today. Is making urine on IV Lasix. IJ dialysis cathter placed by vascular. Nephrology feels patient may not be a good long term dialysis candidate. Patient states that she will go for dialysis if needed. PMT meeting today to discuss long term dialysis with patient and family.   Acute on Chronic Diastolic CHF Normal systolic EF. Stable. Improved with dialysis. Cards following. Continues to be on IV Lasix.  Hypertension On Amlodipine and BB. Hydralazine added for better BP control.  DM 2 CBG reasonably well controlled. On SSI and low dose lantus. Was on Saxagliptin at home. A1C is 7.7.  Bradycardia  Improved once K was corrected with dialysis. Now stable on coreg.  History of CAD Stable. Holding Plavix due to bleeding from HD catheter site. On Coreg and statin.  Hypothyroidism Prior h/o myxedema.TSH 4.2   PAD s/p left BKA in past Stable   Hyperkalemia Improved with dialysis   Anemia  Recheck Hb.   Code Status: Full Code DVT Prophylaxis: Lovenox Family Communication: Discussed with patient.  Disposition Plan: SNF recommended. Also requested by family as not many resources at home. Patient declines. Will see what  transpires at [facility] meeting today. Nephrology wants to monitor off dialysis for 1-2 more days.     LOS: 6 days   [facility]  [facility] Pager [phone] 01/10/2013, 10:40 AM  If 8PM-8AM, please contact night-coverage at [email or web address], password [email or web address]

## 2013-01-11 LAB — RENAL FUNCTION PANEL
CO2: 32 mEq/L (ref 19–32)
Chloride: 92 mEq/L — ABNORMAL LOW (ref 96–112)
GFR calc Af Amer: 20 mL/min — ABNORMAL LOW (ref >90)
GFR calc non Af Amer: 18 mL/min — ABNORMAL LOW (ref >90)
Phosphorus: 2.6 mg/dL (ref 2.3–4.6)
Potassium: 3.3 mEq/L — ABNORMAL LOW (ref 3.5–5.1)
Sodium: 136 mEq/L (ref 135–145)

## 2013-01-11 LAB — GLUCOSE, CAPILLARY
Glucose-Capillary: 130 mg/dL — ABNORMAL HIGH (ref 70–99)
Glucose-Capillary: 126 mg/dL — ABNORMAL HIGH (ref 70–99)

## 2013-01-11 LAB — CBC
Hemoglobin: 11.7 g/dL — ABNORMAL LOW (ref 12.0–15.0)
RBC: 4.03 MIL/uL (ref 3.87–5.11)
WBC: 10 10*3/uL (ref 4.0–10.5)

## 2013-01-11 MED ORDER — FUROSEMIDE 40 MG PO TABS
40.0000 mg | ORAL_TABLET | Freq: Two times a day (BID) | ORAL | Status: DC
  Administered 2013-01-12 – 2013-01-16 (×10): 40 mg via ORAL
  Filled 2013-01-11 (×11): qty 1

## 2013-01-11 MED ORDER — POLYETHYLENE GLYCOL 3350 17 G PO PACK
17.0000 g | PACK | Freq: Every day | ORAL | Status: DC
  Administered 2013-01-11 – 2013-01-12 (×2): 17 g via ORAL
  Filled 2013-01-11 (×2): qty 1

## 2013-01-11 MED ORDER — LEVOTHYROXINE SODIUM 75 MCG PO TABS
75.0000 ug | ORAL_TABLET | Freq: Every day | ORAL | Status: DC
  Administered 2013-01-11 – 2013-01-16 (×5): 75 ug via ORAL
  Filled 2013-01-11 (×7): qty 1

## 2013-01-11 MED ORDER — DOCUSATE SODIUM 100 MG PO CAPS
100.0000 mg | ORAL_CAPSULE | Freq: Two times a day (BID) | ORAL | Status: DC
  Administered 2013-01-11 – 2013-01-16 (×10): 100 mg via ORAL
  Filled 2013-01-11 (×12): qty 1

## 2013-01-11 NOTE — Clinical Social Work Psychosocial (Addendum)
    Clinical Social Work Department BRIEF PSYCHOSOCIAL ASSESSMENT 01/11/2013  Patient:  [name], [name]     Account Number:  [id number]     Admit date:  01/04/2013  Clinical Social Worker:  Tiburcio Pea  Date/Time:  01/09/2013 03:55 PM  Referred by:  Physician  Date Referred:  01/08/2013 Referred for  SNF Placement  Other - See comment   Other Referral:   Palliative care needed for goals of care- unsure as to actual d/c plan at this time.   Interview type:  Other - See comment Other interview type:   Patient and daughter- [name]    PSYCHOSOCIAL DATA Living Status:  FAMILY Admitted from facility:   Level of care:   Primary support name:  [name]-- [phone] Primary support relationship to patient:  CHILD, ADULT Degree of support available:   Strong support    [name]- [phone]  Daughter:  [name]  [phone]    CURRENT CONCERNS Current Concerns  Other - See comment   Other Concerns:   DC plan to be determined    SOCIAL WORK ASSESSMENT / PLAN 79 year old female admitted from home where she lived with her son.  Per daughter- [name]- he has had a recent stroke and can no longer manage her care at home.  Daughter is very concerned about d/c plan as she is unsure if her mother could be managed by her and her sister.  They would consider SNF if needed.  Patient is currently receiving hemodialysis but  not from MD states that she is not a good long term candidate for dialysis.  Pallliative care has been ordered for goals of care and then will be able to discuss further.  Placement efforts will be on standby once this has been determined.  Fl2 placed on chart for MD's signature.   Assessment/plan status:  Psychosocial Support/Ongoing Assessment of Needs Other assessment/ plan:   Information/referral to community resources:   SNF bed list will be placed in room once dc is determined    PATIENT'S/FAMILY'S RESPONSE TO PLAN OF CARE: Patient is  alert and very pleasant. She wanted CSW to speak to her daughter about her d/c plan. She is agreeable to go to a SNF if needed.  CSW will follow up with family. Patient's daughter is very pleasant and appreciative of CSW's assistance.  [name]. [name], LCSWA [phone]

## 2013-01-11 NOTE — Progress Notes (Signed)
Pt. Seen and examined. Agree with the NP/PA-C note as written.  Stable from a cardiac standpoint on appropriate medications. She agreed to DNR, but does want to go to SNF. Not getting dialysis now, however, SCr is rising and she may again need it in the future- I believe that she would do poorly long-term on dialysis and this really is not a viable option. If she is discharged to SNF, I would be happy to see her back in follow-up in the office.  Will sign-off for now. Larey Seat free to call with questions.  Chrystie Nose, MD, [facility] Attending Cardiologist [facility]

## 2013-01-11 NOTE — Progress Notes (Addendum)
[facility] PROGRESS NOTE  [name] NFA:[id number] DOB: [birth year] DOA: 01/04/2013  PCP: Georgianne Fick, MD  Brief HPI: [name] is a 79 y.o. female with Multiple Medical Problems including CAD, CHF, Myxedema Cardiomyopathy and CKD Stage III- V, who was brought to the ED with complaints of worsening SOB and Wheezing. EMS was called to her home and found her O2 sats to be 70% and she was placed on supplemental O2, and had mild improvement. She was found to have findings of CHF. She has acute on chronic renal failure. She did not diurese with Lasix and required initiation of dialysis after a temp dialysis catheter was placed. She is now stable.   Past medical history:  Past Medical History  Diagnosis Date  . Coronary artery disease   . TIA (transient ischemic attack)   . Hypertension   . Shingles   . Stroke   . Anemia   . CAD (coronary artery disease) 02/08/2011  . Cardiomyopathy, idiopathic 02/08/2011  . Chronic kidney disease   . Angina     Takes Isosorbide  . Unstable angina 02/08/2011  . Myocardial infarct     x 3 unsure of years  . Irregular heartbeat   . Ulcer   . GERD (gastroesophageal reflux disease)     takes Protonix and zantac  . Hx of transient ischemic attack (TIA)   . Memory loss   . CHF (congestive heart failure)     Takes Lasix  . Gout     takes allopurinol  . Peripheral vascular disease     left lower  leg  . Diabetes mellitus     type 2 NIDDM x 2 years; no meds  . Chronic kidney disease (CKD), stage IV (severe)   . Arthritis   . Shortness of breath   . Thyroid disease   . Hypothyroidism     (SEVERE) Takes Levothryroxine  . Hypothyroid 02/08/2011  . Nonischemic cardiomyopathy     EF now is 55%, reduced due to myxedema, which is improved.  . Dyslipidemia   . Peripheral neuropathy   . H/O echocardiogram 09/06/11    Indication- nonIschemic Cardiomyopathy. EF = now greater than 55% with no regional wall motion abnormalities. Tthere  is mild to moderate trisuspid regurgitayion and mild pulmonary hypertension with an RVSP of 35 mmHg as well as stage 1 diastolic dysfunction and mild to moderate LVH.  Marland Kitchen Abnormal nuclear stress test 06/01/09    Demonstrated a new area of infarct scar, peri-infarct ischemia seen in the inferolateral territory. EF eas normal at 70% with mild hypocontractility at the apex, distal inferolateral wall.    Consultants:  Vascular  Cards  nephro PMT  Procedures:  HD  Right IJ HD cath- 10/19  Antibiotics:  None  Subjective: Patrent feels fine. Now agreeable to go to SNF. Still unsure about HD if needed in future. She thinks she will do well with dialysis.   Objective: Vital Signs  Filed Vitals:   01/10/13 1525 01/10/13 1752 01/10/13 2011 01/11/13 0456  BP: 134/46 164/52 161/58 156/52  Pulse: 71 77 81 74  Temp: 98.6 F (37 C)  98 F (36.7 C) 98.6 F (37 C)  TempSrc: Oral  Oral Oral  Resp: 16  18 18   Height:      Weight:    58.968 kg (130 lb)  SpO2: 94%  94% 96%    Filed Weights   01/09/13 0613 01/10/13 0554 01/11/13 0456  Weight: 57.8 kg (127 lb 6.8 oz) 59.512  kg (131 lb 3.2 oz) 58.968 kg (130 lb)    Intake/Output from previous day: 10/23 0701 - 10/24 0700 In: 360 [P.O.:360] Out: 1400 [Urine:1400]  General appearance: alert, cooperative, appears stated age and no distress Resp: diminished air entry at bases. No crackles or wheezing. Cardio: regular rate and rhythm, S1, S2 normal, no murmur, click, rub or gallop GI: soft, non-tender; bowel sounds normal; no masses,  no organomegaly Extremities: extremities normal, atraumatic, no cyanosis or edema and left BKA Neurologic: alert and oriented x 3. No focal deficits.  Lab Results:  Basic Metabolic Panel:  Recent Labs Lab 01/07/13 0412 01/08/13 0420 01/09/13 0625 01/10/13 0500 01/11/13 0610  NA 130* 137 136 133* 136  K 4.7 4.1 3.9 3.3* 3.3*  CL 91* 96 95* 92* 92*  CO2 23 30 28 29  32  GLUCOSE 189* 151* 113* 131*  128*  BUN 60* 32* 19 26* 28*  CREATININE 3.53* 2.51* 1.83* 2.17* 2.34*  CALCIUM 8.0* 8.1* 8.8 8.9 9.2  PHOS 5.7* 3.9 2.4 2.5 2.6   Liver Function Tests:  Recent Labs Lab 01/04/13 2315  01/07/13 0412 01/08/13 0420 01/09/13 0625 01/10/13 0500 01/11/13 0610  AST 25  --   --   --   --   --   --   ALT 18  --   --   --   --   --   --   ALKPHOS 99  --   --   --   --   --   --   BILITOT 0.2*  --   --   --   --   --   --   PROT 6.4  --   --   --   --   --   --   ALBUMIN 3.4*  < > 3.6 3.6 3.4* 3.4* 3.6  < > = values in this interval not displayed. CBC:  Recent Labs Lab 01/04/13 2315 01/06/13 0500 01/07/13 1048 01/11/13 0610  WBC 8.8 7.1 9.8 10.0  NEUTROABS 5.8  --   --   --   HGB 11.0* 10.2* 9.3* 11.7*  HCT 33.4* 31.2* 28.0* 35.9*  MCV 87.0 86.2 85.9 89.1  PLT 350 320 275 237   Cardiac Enzymes:  Recent Labs Lab 01/04/13 2315 01/05/13 0130 01/05/13 0825 01/05/13 1330  TROPONINI <0.30 <0.30 <0.30 <0.30   BNP (last 3 results)  Recent Labs  01/04/13 2315  PROBNP 16572.0*   CBG:  Recent Labs Lab 01/10/13 0544 01/10/13 1106 01/10/13 1611 01/10/13 2059 01/11/13 0612  GLUCAP 129* 238* 154* 120* 130*    Recent Results (from the past 240 hour(s))  MRSA PCR SCREENING     Status: None   Collection Time    01/05/13  2:02 AM      Result Value Range Status   MRSA by PCR NEGATIVE  NEGATIVE Final   Comment:            The GeneXpert MRSA Assay (FDA     approved for NASAL specimens     only), is one component of a     comprehensive MRSA colonization     surveillance program. It is not     intended to diagnose MRSA     infection nor to guide or     monitor treatment for     MRSA infections.  URINE CULTURE     Status: None   Collection Time    01/05/13  3:02 AM      Result  Value Range Status   Specimen Description URINE, CATHETERIZED   Final   Special Requests NONE   Final   Culture  Setup Time     Final   Value: 01/05/2013 16:13     Performed at [facility]   Colony Count     Final   Value: NO GROWTH     Performed at [facility]   Culture     Final   Value: NO GROWTH     Performed at [facility]   Report Status 01/06/2013 FINAL   Final      Studies/Results: No results found.  Medications:  Scheduled: . allopurinol  100 mg Oral Daily  . amLODipine  10 mg Oral Daily  . aspirin EC  81 mg Oral Daily  . calcium acetate  667 mg Oral TID WC  . carvedilol  6.25 mg Oral BID WC  . darbepoetin (ARANESP) injection - DIALYSIS  60 mcg Intravenous Q Mon-HD  . enoxaparin (LOVENOX) injection  30 mg Subcutaneous Daily  . ferric gluconate (FERRLECIT/NULECIT) IV  125 mg Intravenous Q M,W,F-HD  . furosemide  40 mg Intravenous BID  . gabapentin  300 mg Oral QHS  . hydrALAZINE  25 mg Oral Q8H  . insulin aspart  0-5 Units Subcutaneous QHS  . insulin aspart  0-9 Units Subcutaneous TID WC  . insulin glargine  5 Units Subcutaneous Daily  . isosorbide mononitrate  60 mg Oral Daily  . levothyroxine  75 mcg Oral QAC breakfast  . multivitamin  1 tablet Oral QHS  . simvastatin  20 mg Oral q1800  . sodium chloride  3 mL Intravenous Q12H  . Travoprost (BAK Free)  1 drop Both Eyes QHS   Continuous: . 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 Nephrology following. Underwent dialysis 10/21. No further plans for dialysis as renal function has improved. Still on IV Lasix. IJ dialysis cathter placed by vascular. Nephrology feels patient may not be a good long term dialysis candidate. Patient states that she will go for dialysis if needed. PMT also feels long term dialysis may not be a good option. Since renal function has improved she does not need long term dialysis at this time. This can be discussed further as an outpatient. Continue to monitor renal function over next 1-2 days.  Acute on Chronic Diastolic CHF Normal systolic EF. Improved with dialysis. Cards following. Continues to be on IV Lasix.  Hypertension On Amlodipine, Imdur and BB. Hydralazine added for better BP control.  DM 2 CBG reasonably well controlled. On SSI and low dose lantus. Was on Saxagliptin at home. A1C is 7.7.  Constipation Stool softeners and laxatives.   Bradycardia  Improved once K was corrected  with dialysis. Now stable on coreg.  History of CAD Stable. Holding Plavix due to bleeding from HD catheter site. On Coreg and statin. Plavix can be resumed at discharge.  Hypothyroidism Prior h/o myxedema.TSH 4.2   PAD s/p left BKA in past Stable   Hyperkalemia Improved with dialysis   Anemia  Recheck Hb.   Code Status: Full Code DVT Prophylaxis: Lovenox Family Communication: Discussed with patient.  Disposition Plan: To SNF on 10/27. CSW aware.    LOS: 7 days   [facility]  [facility] Pager [phone] 01/11/2013, 9:11 AM  If 8PM-8AM, please contact night-coverage at [email or web address], password [facility]

## 2013-01-11 NOTE — Clinical Social Work Placement (Addendum)
    Clinical Social Work Department CLINICAL SOCIAL WORK PLACEMENT NOTE 01/19/2013  Patient:  MATA, ROWEN  Account Number:  [id number] Admit date:  01/04/2013  Clinical Social Worker:  Lupita Leash Awais Cobarrubias, LCSWA  Date/time:  01/11/2013 02:18 PM  Clinical Social Work is seeking post-discharge placement for this patient at the following level of care:   SKILLED NURSING   (*CSW will update this form in Epic as items are completed)   01/11/2013  Patient/family provided with [facility] Department of Clinical Social Work's list of facilities offering this level of care within the geographic area requested by the patient (or if unable, by the patient's family).  01/11/2013  Patient/family informed of their freedom to choose among providers that offer the needed level of care, that participate in Medicare, Medicaid or managed care program needed by the patient, have an available bed and are willing to accept the patient.  01/11/2013  Patient/family informed of [facility]' ownership interest in [facility], as well as of the fact that they are under no obligation to receive care at this facility.  PASARR submitted to EDS on 01/09/2013 PASARR number received from EDS on 01/09/2013  FL2 transmitted to all facilities in geographic area requested by pt/family on  01/11/2013 FL2 transmitted to all facilities within larger geographic area on   Patient informed that his/her managed care company has contracts with or will negotiate with  certain facilities, including the following:   [facility]- Medicare Complete     Patient/family informed of bed offers received:  01/15/2013 Patient chooses bed at [facility], [place] Physician recommends and patient chooses bed at    Patient to be transferred to [facility], [place] on  01/16/2013 Patient to be transferred to facility by Ambulance Sharin Mons)  The following physician request were entered in Epic:   Additional  Comments: 01/16/13  OK per MD for d/c to SNF. Patient and family are agreeable to d/c plan and are pleased with bed availability at [facility]. Nursing notified and called report. No further CSW needs identified. Authorization obtained from [facility].Boyd Kerbs RNCM .  SW signing off.  [name]. Cashlyn Huguley, LCSWA  [phone]

## 2013-01-11 NOTE — Plan of Care (Signed)
Problem: Phase I Progression Outcomes Goal: EF % per last Echo/documented,Core Reminder form on chart Outcome: Completed/Met Date Met:  01/11/13 EF 65-70% per ECHO performed on 01/05/13.

## 2013-01-11 NOTE — Progress Notes (Signed)
Physical Therapy Treatment Patient Details Name: DEJANIRA PAMINTUAN MRN: [id number] DOB: 1924/02/27 Today's Date: 01/11/2013 Time: 4098-1191 PT Time Calculation (min): 24 min  PT Assessment / Plan / Recommendation  History of Present Illness CHEVY VIRGO is a 79 y.o. female with Multiple Medical Problems including L BKA, CAD, CHF, Myxedema Cardiomyopathy and CKD Stage III- V, who was brought to the ED with complaints of worsening SOB and Wheezing . Acute renal failure with initiation of HD.   PT Comments   Patient making slow improvements with mobility.  Patient has decided to go to SNF at discharge for continued therapy - updated discharge plan and frequency.  Will continue to follow.  Follow Up Recommendations  SNF     Does the patient have the potential to tolerate intense rehabilitation     Barriers to Discharge        Equipment Recommendations  None recommended by PT    Recommendations for Other Services    Frequency Min 2X/week   Progress towards PT Goals Progress towards PT goals: Progressing toward goals  Plan Discharge plan needs to be updated;Frequency needs to be updated    Precautions / Restrictions Precautions Precautions: Fall Precaution Comments: L BKA Restrictions Weight Bearing Restrictions: No   Pertinent Vitals/Pain     Mobility  Bed Mobility Bed Mobility: Supine to Sit;Sitting - Scoot to Edge of Bed Supine to Sit: 5: Supervision;HOB flat;With rails Sitting - Scoot to Edge of Bed: 5: Supervision;With rail Details for Bed Mobility Assistance: heavy use of rail Transfers Transfers: Heritage manager Transfers: 4: Min assist;With armrests Details for Transfer Assistance: Verbal cues for safe technique.  Once in chair, able to use armrests to scoot back in chair with supervision. Ambulation/Gait Ambulation/Gait Assistance: Not tested (comment)    Exercises General Exercises - Lower Extremity Ankle Circles/Pumps: AROM;Right;10  reps;Seated Quad Sets: AROM;Both;10 reps;Seated Long Arc Quad: AROM;Right;10 reps;Seated Hip ABduction/ADduction: AROM;Right;10 reps;Seated Hip Flexion/Marching: AROM;Both;10 reps;Seated   PT Diagnosis:    PT Problem List:   PT Treatment Interventions:     PT Goals (current goals can now be found in the care plan section)    Visit Information  Last PT Received On: 01/11/13 Assistance Needed: +1 History of Present Illness: RAVENNA LEGORE is a 79 y.o. female with Multiple Medical Problems including L BKA, CAD, CHF, Myxedema Cardiomyopathy and CKD Stage III- V, who was brought to the ED with complaints of worsening SOB and Wheezing . Acute renal failure with initiation of HD.    Subjective Data  Subjective: "I didn't know if I could do it"   Cognition  Cognition Arousal/Alertness: Awake/alert Behavior During Therapy: WFL for tasks assessed/performed Overall Cognitive Status: Within Functional Limits for tasks assessed    Balance  Static Sitting Balance Static Sitting - Balance Support: Feet unsupported;Right upper extremity supported Static Sitting - Level of Assistance: 6: Modified independent (Device/Increase time) Static Sitting - Comment/# of Minutes: 4  End of Session PT - End of Session Equipment Utilized During Treatment: Gait belt Activity Tolerance: Patient tolerated treatment well Patient left: in chair;with call bell/phone within reach Nurse Communication: Mobility status   GP     Vena Austria 01/11/2013, 2:07 PM Durenda Hurt. Renaldo Fiddler, [facility] Acute Rehab Services Pager [phone]

## 2013-01-11 NOTE — Progress Notes (Signed)
Palliative Medicine Team  Progress Note   Palliative care follow up and met patient at bedside.  Patient states that she feels fine. Denies any discomfort. Reports good appetite.   Filed Vitals:   01/10/13 1752 01/10/13 2011 01/11/13 0456 01/11/13 1001  BP: 164/52 161/58 156/52 135/52  Pulse: 77 81 74 84  Temp:  98 F (36.7 C) 98.6 F (37 C)   TempSrc:  Oral Oral   Resp:  18 18   Height:      Weight:   130 lb (58.968 kg)   SpO2:  94% 96% 94%   General: NAD. Calm and pleasant  Lungs: CTAB  Heart: Reg, no M/R/G  Abd: Soft, NTND. BS x 4  Ext; No edema.  Left BKA.  Assessment:  [name] is an 79 yo woman with ESRD Stage IV, DM, Severe PAD with left BKA, CAD, Diastolic CHF admitted with severe dyspnea and volume overload who received urgent HD for acute on chronic renal failure. Palliative care team is consulted for GOC, quality of life and symptoms management.    Plan  - DNR status  - Patient desires medical treatment for reversible problems and chronic medical problems. - continue symptomatic management--well pain control and no other discomfort. - Will no recommend long term HD.  - Prognosis is likely less than 6 months - Disposition--Recommend SNF with PCS follow up and transition to hospice care after the SNF rehab.    15 minutes. Greater than 50%  of this time was spent counseling and coordinating care related to the above assessment and plan.

## 2013-01-11 NOTE — Progress Notes (Signed)
Patient ID: [name], female   DOB: [birth year], 79 y.o.   MRN: [id number] S:no new complaints O:BP 135/52  Pulse 84  Temp(Src) 98.6 F (37 C) (Oral)  Resp 18  Ht 4\' 11"  (1.499 m)  Wt 58.968 kg (130 lb)  BMI 26.24 kg/m2  SpO2 94%  Intake/Output Summary (Last 24 hours) at 01/11/13 1335 Last data filed at 01/11/13 1316  Gross per 24 hour  Intake   1200 ml  Output   1800 ml  Net   -600 ml   Intake/Output: I/O last 3 completed shifts: In: 678 [P.O.:678] Out: 1900 [Urine:1900]  Intake/Output this shift:  Total I/O In: 840 [P.O.:840] Out: 550 [Urine:550] Weight change: -0.544 kg (-1 lb 3.2 oz) Gen:WD elderly AAF in NAD CVS:no rub Resp:occ rhonchi [id number] Ext:s/p left BKA, no edema   Recent Labs Lab 01/04/13 2315 01/06/13 0450 01/07/13 0412 01/08/13 0420 01/09/13 0625 01/10/13 0500 01/11/13 0610  NA 137 127* 130* 137 136 133* 136  K 5.3* 6.0* 4.7 4.1 3.9 3.3* 3.3*  CL 102 89* 91* 96 95* 92* 92*  CO2 20 16* 23 30 28 29  32  GLUCOSE 192* 308* 189* 151* 113* 131* 128*  BUN 74* 95* 60* 32* 19 26* 28*  CREATININE 4.18* 5.26* 3.53* 2.51* 1.83* 2.17* 2.34*  ALBUMIN 3.4* 3.7 3.6 3.6 3.4* 3.4* 3.6  CALCIUM 8.3* 8.3* 8.0* 8.1* 8.8 8.9 9.2  PHOS  --  6.0* 5.7* 3.9 2.4 2.5 2.6  AST 25  --   --   --   --   --   --   ALT 18  --   --   --   --   --   --    Liver Function Tests:  Recent Labs Lab 01/04/13 2315  01/09/13 0625 01/10/13 0500 01/11/13 0610  AST 25  --   --   --   --   ALT 18  --   --   --   --   ALKPHOS 99  --   --   --   --   BILITOT 0.2*  --   --   --   --   PROT 6.4  --   --   --   --   ALBUMIN 3.4*  < > 3.4* 3.4* 3.6  < > = values in this interval not displayed. No results found for this basename: LIPASE, AMYLASE,  in the last 168 hours No results found for this basename: AMMONIA,  in the last 168 hours CBC:  Recent Labs Lab 01/04/13 2315 01/06/13 0500 01/07/13 1048 01/11/13 0610  WBC 8.8 7.1 9.8 10.0  NEUTROABS 5.8  --   --   --   HGB  11.0* 10.2* 9.3* 11.7*  HCT 33.4* 31.2* 28.0* 35.9*  MCV 87.0 86.2 85.9 89.1  PLT 350 320 275 237   Cardiac Enzymes:  Recent Labs Lab 01/04/13 2315 01/05/13 0130 01/05/13 0825 01/05/13 1330  TROPONINI <0.30 <0.30 <0.30 <0.30   CBG:  Recent Labs Lab 01/10/13 1106 01/10/13 1611 01/10/13 2059 01/11/13 0612 01/11/13 1109  GLUCAP 238* 154* 120* 130* 387*    Iron Studies: No results found for this basename: IRON, TIBC, TRANSFERRIN, FERRITIN,  in the last 72 hours Studies/Results: No results found. Marland Kitchen allopurinol  100 mg Oral Daily  . amLODipine  10 mg Oral Daily  . aspirin EC  81 mg Oral Daily  . calcium acetate  667 mg Oral TID WC  . carvedilol  6.25 mg  Oral BID WC  . darbepoetin (ARANESP) injection - DIALYSIS  60 mcg Intravenous Q Mon-HD  . docusate sodium  100 mg Oral BID  . enoxaparin (LOVENOX) injection  30 mg Subcutaneous Daily  . ferric gluconate (FERRLECIT/NULECIT) IV  125 mg Intravenous Q M,W,F-HD  . furosemide  40 mg Intravenous BID  . gabapentin  300 mg Oral QHS  . hydrALAZINE  25 mg Oral Q8H  . insulin aspart  0-5 Units Subcutaneous QHS  . insulin aspart  0-9 Units Subcutaneous TID WC  . insulin glargine  5 Units Subcutaneous Daily  . isosorbide mononitrate  60 mg Oral Daily  . levothyroxine  75 mcg Oral QAC breakfast  . multivitamin  1 tablet Oral QHS  . polyethylene glycol  17 g Oral Daily  . simvastatin  20 mg Oral q1800  . sodium chloride  3 mL Intravenous Q12H  . Travoprost (BAK Free)  1 drop Both Eyes QHS    BMET    Component Value Date/Time   NA 136 01/11/2013 0610   K 3.3* 01/11/2013 0610   CL 92* 01/11/2013 0610   CO2 32 01/11/2013 0610   GLUCOSE 128* 01/11/2013 0610   BUN 28* 01/11/2013 0610   CREATININE 2.34* 01/11/2013 0610   CREATININE 2.70* 01/01/2013 0951   CALCIUM 9.2 01/11/2013 0610   CALCIUM 9.6 10/11/2010 1605   GFRNONAA 18* 01/11/2013 0610   GFRAA 20* 01/11/2013 0610   CBC    Component Value Date/Time   WBC 10.0  01/11/2013 0610   RBC 4.03 01/11/2013 0610   HGB 11.7* 01/11/2013 0610   HCT 35.9* 01/11/2013 0610   PLT 237 01/11/2013 0610   MCV 89.1 01/11/2013 0610   MCH 29.0 01/11/2013 0610   MCHC 32.6 01/11/2013 0610   RDW 16.6* 01/11/2013 0610   LYMPHSABS 1.8 01/04/2013 2315   MONOABS 0.7 01/04/2013 2315   EOSABS 0.5 01/04/2013 2315   BASOSABS 0.0 01/04/2013 2315     Assessment/Plan:  1. CHF/CMP- improved with HD and UF. HR has improved as well as UOP 1. Switch to po lasix and follow. 2. AKI/CKD due to cardiorenal syndrome:  1. She did well with short/slow HD but agree that given her advanced age and multiple irreversible comorbidities, she would not be a good candidate for longterm HD 2. Increased UOP post UF so hopefully is in more favorable part of Starling curve in cardiorenal syndrome 3. No indication for HD at this time. 4. Cont to follow UOP and daily Scr.  5. Appreciate palliative care consult.  Pt now DNR and no HD 6. Will resume lasix and follow UOP and dailyu Scr.  3. Hyperkalemia: Improved post HD 4. Metabolic acidosis- improved post HD 5. Anemia: on IV Iron and ESA.  6. CKD-MBD: improving. On binders and follow 7. Hypothyroidism- prior h/o myxedema. TSH stable 8. Nutrition: improving 9. Hypertension:stable 10. Vascular access- new RIJ PC. Will have her f/u with Dr. Darrick Penna who knows her from outpt f/u and they can decide whether or not to proceed with L AVG with Dr. Arbie Cookey or to discontinue PC and cont with conservative therapy without HD as an option.   11. Dispo- pt now amenable to short term rehab. D/w family and agree with palliative care evaluation in an 79yo with CMP on HD.   12.   Jamarri Vuncannon A

## 2013-01-11 NOTE — Progress Notes (Signed)
Subjective: Only complains of constipation  Objective: Vital signs in last 24 hours: Temp:  [98 F (36.7 C)-98.6 F (37 C)] 98.6 F (37 C) (10/24 0456) Pulse Rate:  [71-84] 84 (10/24 1001) Resp:  [16-18] 18 (10/24 0456) BP: (134-164)/(46-58) 135/52 mmHg (10/24 1001) SpO2:  [94 %-96 %] 94 % (10/24 1001) Weight:  [130 lb (58.968 kg)] 130 lb (58.968 kg) (10/24 0456) Weight change: -1 lb 3.2 oz (-0.544 kg) Last BM Date: 01/08/13 Intake/Output from previous day: -1040 (total since admit -4943)  Wt 130 down from 141 at pk. 10/23 0701 - 10/24 0700 In: 360 [P.O.:360] Out: 1400 [Urine:1400] Intake/Output this shift: Total I/O In: 600 [P.O.:600] Out: 250 [Urine:250]  PE: General:Pleasant affect, NAD Skin:Warm and dry, brisk capillary refill HEENT:normocephalic, sclera clear, mucus membranes moist Heart:S1S2 RRR without murmur, gallup, rub or click Lungs:clear without rales, rhonchi, or wheezes JYN:WGNFA, soft, non tender, + BS, do not palpate liver spleen or masses Ext:no lower ext edema, Lt BKA,  Neuro:alert and oriented, MAE, follows commands, + facial symmetry   Lab Results:  Recent Labs  01/11/13 0610  WBC 10.0  HGB 11.7*  HCT 35.9*  PLT 237   BMET  Recent Labs  01/10/13 0500 01/11/13 0610  NA 133* 136  K 3.3* 3.3*  CL 92* 92*  CO2 29 32  GLUCOSE 131* 128*  BUN 26* 28*  CREATININE 2.17* 2.34*  CALCIUM 8.9 9.2   No results found for this basename: TROPONINI, CK, MB,  in the last 72 hours  Lab Results  Component Value Date   CHOL 169 02/08/2011   HDL 71 02/08/2011   LDLCALC 76 02/08/2011   TRIG 112 02/08/2011   CHOLHDL 2.4 02/08/2011   Lab Results  Component Value Date   HGBA1C 7.7* 01/05/2013     Lab Results  Component Value Date   TSH 4.261 01/05/2013    Hepatic Function Panel  Recent Labs  01/11/13 0610  ALBUMIN 3.6     Studies/Results: No results found.  Medications: I have reviewed the patient's current  medications. Scheduled Meds: . allopurinol  100 mg Oral Daily  . amLODipine  10 mg Oral Daily  . aspirin EC  81 mg Oral Daily  . calcium acetate  667 mg Oral TID WC  . carvedilol  6.25 mg Oral BID WC  . darbepoetin (ARANESP) injection - DIALYSIS  60 mcg Intravenous Q Mon-HD  . docusate sodium  100 mg Oral BID  . enoxaparin (LOVENOX) injection  30 mg Subcutaneous Daily  . ferric gluconate (FERRLECIT/NULECIT) IV  125 mg Intravenous Q M,W,F-HD  . furosemide  40 mg Intravenous BID  . gabapentin  300 mg Oral QHS  . hydrALAZINE  25 mg Oral Q8H  . insulin aspart  0-5 Units Subcutaneous QHS  . insulin aspart  0-9 Units Subcutaneous TID WC  . insulin glargine  5 Units Subcutaneous Daily  . isosorbide mononitrate  60 mg Oral Daily  . levothyroxine  75 mcg Oral QAC breakfast  . multivitamin  1 tablet Oral QHS  . polyethylene glycol  17 g Oral Daily  . simvastatin  20 mg Oral q1800  . sodium chloride  3 mL Intravenous Q12H  . Travoprost (BAK Free)  1 drop Both Eyes QHS   Continuous Infusions: . 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   HTN is improving -Dr.  Royann Shivers recommended increase of coreg yesterday- though pt was bradycardic on admit in acute renal failure with K+ of 5.3.   Hr improved by 01/09/13 and has remained > 65.  Pallative care spoke with pt and family yesterday She is now DNR, with plans for SNF for short term rehab- with thought to transitions to hospice after SNF rehab. No HD. But optimize cardiac status.  LOS: 7 days   Time spent with pt. :15 minutes. [place] [facility]  Nurse Practitioner Certified Pager [phone] 01/11/2013, 11:07 AM

## 2013-01-12 DIAGNOSIS — J9600 Acute respiratory failure, unspecified whether with hypoxia or hypercapnia: Secondary | ICD-10-CM

## 2013-01-12 DIAGNOSIS — I2510 Atherosclerotic heart disease of native coronary artery without angina pectoris: Secondary | ICD-10-CM

## 2013-01-12 DIAGNOSIS — K5900 Constipation, unspecified: Secondary | ICD-10-CM

## 2013-01-12 LAB — RENAL FUNCTION PANEL
Albumin: 3.5 g/dL (ref 3.5–5.2)
BUN: 32 mg/dL — ABNORMAL HIGH (ref 6–23)
CO2: 31 mEq/L (ref 19–32)
Chloride: 89 mEq/L — ABNORMAL LOW (ref 96–112)
Potassium: 3.8 mEq/L (ref 3.5–5.1)
Sodium: 133 mEq/L — ABNORMAL LOW (ref 135–145)

## 2013-01-12 LAB — GLUCOSE, CAPILLARY
Glucose-Capillary: 134 mg/dL — ABNORMAL HIGH (ref 70–99)
Glucose-Capillary: 107 mg/dL — ABNORMAL HIGH (ref 70–99)

## 2013-01-12 MED ORDER — FLEET ENEMA 7-19 GM/118ML RE ENEM
1.0000 | ENEMA | Freq: Every day | RECTAL | Status: DC | PRN
Start: 2013-01-12 — End: 2013-01-16
  Administered 2013-01-13: 08:00:00 1 via RECTAL
  Filled 2013-01-12: qty 1

## 2013-01-12 MED ORDER — POLYETHYLENE GLYCOL 3350 17 G PO PACK
17.0000 g | PACK | Freq: Two times a day (BID) | ORAL | Status: DC
  Administered 2013-01-12 – 2013-01-16 (×6): 17 g via ORAL
  Filled 2013-01-12 (×9): qty 1

## 2013-01-12 NOTE — Progress Notes (Signed)
[facility] PROGRESS NOTE  ALISON KUBICKI ZOX:[id number] DOB: 04-02-1923 DOA: 01/04/2013  PCP: Georgianne Fick, MD  Brief HPI: [name] is a 79 y.o. female with Multiple Medical Problems including CAD, CHF, Myxedema Cardiomyopathy and CKD Stage III- V, who was brought to the ED with complaints of worsening SOB and Wheezing. EMS was called to her home and found her O2 sats to be 70% and she was placed on supplemental O2, and had mild improvement. She was found to have findings of CHF. She has acute on chronic renal failure. She did not diurese with Lasix and required initiation of dialysis after a temp dialysis catheter was placed. She is now stable.   Past medical history:  Past Medical History  Diagnosis Date  . Coronary artery disease   . TIA (transient ischemic attack)   . Hypertension   . Shingles   . Stroke   . Anemia   . CAD (coronary artery disease) 02/08/2011  . Cardiomyopathy, idiopathic 02/08/2011  . Chronic kidney disease   . Angina     Takes Isosorbide  . Unstable angina 02/08/2011  . Myocardial infarct     x 3 unsure of years  . Irregular heartbeat   . Ulcer   . GERD (gastroesophageal reflux disease)     takes Protonix and zantac  . Hx of transient ischemic attack (TIA)   . Memory loss   . CHF (congestive heart failure)     Takes Lasix  . Gout     takes allopurinol  . Peripheral vascular disease     left lower  leg  . Diabetes mellitus     type 2 NIDDM x 2 years; no meds  . Chronic kidney disease (CKD), stage IV (severe)   . Arthritis   . Shortness of breath   . Thyroid disease   . Hypothyroidism     (SEVERE) Takes Levothryroxine  . Hypothyroid 02/08/2011  . Nonischemic cardiomyopathy     EF now is 55%, reduced due to myxedema, which is improved.  . Dyslipidemia   . Peripheral neuropathy   . H/O echocardiogram 09/06/11    Indication- nonIschemic Cardiomyopathy. EF = now greater than 55% with no regional wall motion abnormalities. Tthere  is mild to moderate trisuspid regurgitayion and mild pulmonary hypertension with an RVSP of 35 mmHg as well as stage 1 diastolic dysfunction and mild to moderate LVH.  Marland Kitchen Abnormal nuclear stress test 06/01/09    Demonstrated a new area of infarct scar, peri-infarct ischemia seen in the inferolateral territory. EF eas normal at 70% with mild hypocontractility at the apex, distal inferolateral wall.    Consultants:  Vascular  Cards  nephro PMT  Procedures:  HD  Right IJ HD cath- 10/19  Antibiotics:  None  Subjective: Patient continues to feel well. Still no BM. No abdominal pain.   Objective: Vital Signs  Filed Vitals:   01/11/13 1339 01/11/13 1540 01/11/13 2019 01/12/13 0546  BP: 141/53 128/45 147/47 139/69  Pulse: 72 67 74 71  Temp: 98.6 F (37 C)  98.2 F (36.8 C) 98.7 F (37.1 C)  TempSrc: Oral  Oral Oral  Resp: 18  18 18   Height:      Weight:    59.2 kg (130 lb 8.2 oz)  SpO2: 96%  95% 94%    Filed Weights   01/10/13 0554 01/11/13 0456 01/12/13 0546  Weight: 59.512 kg (131 lb 3.2 oz) 58.968 kg (130 lb) 59.2 kg (130 lb 8.2 oz)  Intake/Output from previous day: 10/24 0701 - 10/25 0700 In: 843 [P.O.:840; I.V.:3] Out: 1200 [Urine:1200]  General appearance: alert, cooperative, appears stated age and no distress Resp: diminished air entry at bases. No crackles or wheezing. Cardio: regular rate and rhythm, S1, S2 normal, no murmur, click, rub or gallop GI: soft, non-tender; bowel sounds normal; no masses,  no organomegaly Extremities: extremities normal, atraumatic, no cyanosis or edema and left BKA Neurologic: alert and oriented x 3. No focal deficits.  Lab Results:  Basic Metabolic Panel:  Recent Labs Lab 01/08/13 0420 01/09/13 0625 01/10/13 0500 01/11/13 0610 01/12/13 0340  NA 137 136 133* 136 133*  K 4.1 3.9 3.3* 3.3* 3.8  CL 96 95* 92* 92* 89*  CO2 30 28 29  32 31  GLUCOSE 151* 113* 131* 128* 128*  BUN 32* 19 26* 28* 32*  CREATININE 2.51* 1.83*  2.17* 2.34* 2.44*  CALCIUM 8.1* 8.8 8.9 9.2 9.0  PHOS 3.9 2.4 2.5 2.6 2.8   Liver Function Tests:  Recent Labs Lab 01/08/13 0420 01/09/13 0625 01/10/13 0500 01/11/13 0610 01/12/13 0340  ALBUMIN 3.6 3.4* 3.4* 3.6 3.5   CBC:  Recent Labs Lab 01/06/13 0500 01/07/13 1048 01/11/13 0610  WBC 7.1 9.8 10.0  HGB 10.2* 9.3* 11.7*  HCT 31.2* 28.0* 35.9*  MCV 86.2 85.9 89.1  PLT 320 275 237   Cardiac Enzymes:  Recent Labs Lab 01/05/13 1330  TROPONINI <0.30   BNP (last 3 results)  Recent Labs  01/04/13 2315  PROBNP 16572.0*   CBG:  Recent Labs Lab 01/11/13 0612 01/11/13 1109 01/11/13 1628 01/11/13 2120 01/12/13 0600  GLUCAP 130* 387* 135* 126* 134*    Recent Results (from the past 240 hour(s))  MRSA PCR SCREENING     Status: None   Collection Time    01/05/13  2:02 AM      Result Value Range Status   MRSA by PCR NEGATIVE  NEGATIVE Final   Comment:            The GeneXpert MRSA Assay (FDA     approved for NASAL specimens     only), is one component of a     comprehensive MRSA colonization     surveillance program. It is not     intended to diagnose MRSA     infection nor to guide or     monitor treatment for     MRSA infections.  URINE CULTURE     Status: None   Collection Time    01/05/13  3:02 AM      Result Value Range Status   Specimen Description URINE, CATHETERIZED   Final   Special Requests NONE   Final   Culture  Setup Time     Final   Value: 01/05/2013 16:13     Performed at [facility] Count     Final   Value: NO GROWTH     Performed at [facility]   Culture     Final   Value: NO GROWTH     Performed at [facility]   Report Status 01/06/2013 FINAL   Final      Studies/Results: No results found.  Medications:  Scheduled: . allopurinol  100 mg Oral Daily  . amLODipine  10 mg Oral Daily  . aspirin EC  81 mg Oral Daily  . calcium acetate  667 mg Oral TID WC  . carvedilol  6.25 mg Oral BID WC   . darbepoetin (  ARANESP) injection - DIALYSIS  60 mcg Intravenous Q Mon-HD  . docusate sodium  100 mg Oral BID  . enoxaparin (LOVENOX) injection  30 mg Subcutaneous Daily  . ferric gluconate (FERRLECIT/NULECIT) IV  125 mg Intravenous Q M,W,F-HD  . furosemide  40 mg Oral BID  . gabapentin  300 mg Oral QHS  . hydrALAZINE  25 mg Oral Q8H  . insulin aspart  0-5 Units Subcutaneous QHS  . insulin aspart  0-9 Units Subcutaneous TID WC  . insulin glargine  5 Units Subcutaneous Daily  . isosorbide mononitrate  60 mg Oral Daily  . levothyroxine  75 mcg Oral QAC breakfast  . multivitamin  1 tablet Oral QHS  . polyethylene glycol  17 g Oral BID  . simvastatin  20 mg Oral q1800  . sodium chloride  3 mL Intravenous Q12H  . Travoprost (BAK Free)  1 drop Both Eyes QHS   Continuous: . 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 Nephrology following. Underwent dialysis 10/21. No further plans for dialysis as renal function has improved. Changed to oral Lasix. IJ dialysis cathter placed by vascular. Nephrology feels patient  may not be a good long term dialysis candidate. Patient states that she will go for dialysis if needed. PMT also feels long term dialysis may not be a good option. Since renal function has improved she does not need long term dialysis at this time. This can be discussed further as an outpatient. Continue to monitor renal function over next 1-2 days.  Acute on Chronic Diastolic CHF Normal systolic EF. Improved with dialysis. Cards following.   Hypertension BP improved. On Amlodipine, Imdur, BB and Hydralazine.  DM 2 CBG reasonably well controlled. On SSI and low dose lantus. Was on Saxagliptin at home. A1C is 7.7.  Constipation Stool softeners and laxatives. Increase dose. TSH normal.    Bradycardia  Improved once K was corrected with dialysis. Now stable on coreg.  History of CAD Stable. Holding Plavix due to bleeding from HD catheter site. On Coreg and statin. Plavix can be resumed at discharge.  Hypothyroidism Prior h/o myxedema.TSH 4.2   PAD s/p left BKA in past Stable   Hyperkalemia Improved with dialysis   Anemia  Stable  Code Status: Full Code DVT Prophylaxis: Lovenox Family Communication: Discussed with patient.  Disposition Plan: To SNF on 10/27. CSW aware.    LOS: 8 days   [facility]  [facility] Pager [phone] 01/12/2013, 10:02 AM  If 8PM-8AM, please contact night-coverage at [email or web address], password [facility]

## 2013-01-12 NOTE — Progress Notes (Signed)
The patient did not have any complaints overnight and did not have any acute changes.   

## 2013-01-12 NOTE — Progress Notes (Addendum)
Clinical Social Worker provided patient's family member, Cristy Friedlander, with skilled nursing facility bed offers. Daughter gave CSW permission to follow up with [facility]. CSW called [facility] and spoke to weekend supervisor who stated that he will take patient's information down and relay it to the weekday admissions.  Maree Krabbe, MSW, Theresia Majors [phone]

## 2013-01-12 NOTE — Progress Notes (Signed)
Patient ID: [name], female   DOB: 09-Dec-1923, 79 y.o.   MRN: [id number] S:no new co O:BP 139/69  Pulse 71  Temp(Src) 98.7 F (37.1 C) (Oral)  Resp 18  Ht 4\' 11"  (1.499 m)  Wt 59.2 kg (130 lb 8.2 oz)  BMI 26.35 kg/m2  SpO2 94%  Intake/Output Summary (Last 24 hours) at 01/12/13 1208 Last data filed at 01/12/13 4098  Gross per 24 hour  Intake    243 ml  Output    950 ml  Net   -707 ml   Intake/Output: I/O last 3 completed shifts: In: 843 [P.O.:840; I.V.:3] Out: 1900 [Urine:1900]  Intake/Output this shift:    Weight change: 0.232 kg (8.2 oz) JXB:JYNWGNF AAF in NAD CVS:no rub Resp:bibasilar crackles AOZ:HYQMVH Ext:s/p L BKA, no edema   Recent Labs Lab 01/06/13 0450 01/07/13 0412 01/08/13 0420 01/09/13 0625 01/10/13 0500 01/11/13 0610 01/12/13 0340  NA 127* 130* 137 136 133* 136 133*  K 6.0* 4.7 4.1 3.9 3.3* 3.3* 3.8  CL 89* 91* 96 95* 92* 92* 89*  CO2 16* 23 30 28 29  32 31  GLUCOSE 308* 189* 151* 113* 131* 128* 128*  BUN 95* 60* 32* 19 26* 28* 32*  CREATININE 5.26* 3.53* 2.51* 1.83* 2.17* 2.34* 2.44*  ALBUMIN 3.7 3.6 3.6 3.4* 3.4* 3.6 3.5  CALCIUM 8.3* 8.0* 8.1* 8.8 8.9 9.2 9.0  PHOS 6.0* 5.7* 3.9 2.4 2.5 2.6 2.8   Liver Function Tests:  Recent Labs Lab 01/10/13 0500 01/11/13 0610 01/12/13 0340  ALBUMIN 3.4* 3.6 3.5   No results found for this basename: LIPASE, AMYLASE,  in the last 168 hours No results found for this basename: AMMONIA,  in the last 168 hours CBC:  Recent Labs Lab 01/06/13 0500 01/07/13 1048 01/11/13 0610  WBC 7.1 9.8 10.0  HGB 10.2* 9.3* 11.7*  HCT 31.2* 28.0* 35.9*  MCV 86.2 85.9 89.1  PLT 320 275 237   Cardiac Enzymes:  Recent Labs Lab 01/05/13 1330  TROPONINI <0.30   CBG:  Recent Labs Lab 01/11/13 1109 01/11/13 1628 01/11/13 2120 01/12/13 0600 01/12/13 1104  GLUCAP 387* 135* 126* 134* 242*    Iron Studies: No results found for this basename: IRON, TIBC, TRANSFERRIN, FERRITIN,  in the last 72  hours Studies/Results: No results found. Marland Kitchen allopurinol  100 mg Oral Daily  . amLODipine  10 mg Oral Daily  . aspirin EC  81 mg Oral Daily  . calcium acetate  667 mg Oral TID WC  . carvedilol  6.25 mg Oral BID WC  . darbepoetin (ARANESP) injection - DIALYSIS  60 mcg Intravenous Q Mon-HD  . docusate sodium  100 mg Oral BID  . enoxaparin (LOVENOX) injection  30 mg Subcutaneous Daily  . ferric gluconate (FERRLECIT/NULECIT) IV  125 mg Intravenous Q M,W,F-HD  . furosemide  40 mg Oral BID  . gabapentin  300 mg Oral QHS  . hydrALAZINE  25 mg Oral Q8H  . insulin aspart  0-5 Units Subcutaneous QHS  . insulin aspart  0-9 Units Subcutaneous TID WC  . insulin glargine  5 Units Subcutaneous Daily  . isosorbide mononitrate  60 mg Oral Daily  . levothyroxine  75 mcg Oral QAC breakfast  . multivitamin  1 tablet Oral QHS  . polyethylene glycol  17 g Oral BID  . simvastatin  20 mg Oral q1800  . sodium chloride  3 mL Intravenous Q12H  . Travoprost (BAK Free)  1 drop Both Eyes QHS    BMET  Component Value Date/Time   NA 133* 01/12/2013 0340   K 3.8 01/12/2013 0340   CL 89* 01/12/2013 0340   CO2 31 01/12/2013 0340   GLUCOSE 128* 01/12/2013 0340   BUN 32* 01/12/2013 0340   CREATININE 2.44* 01/12/2013 0340   CREATININE 2.70* 01/01/2013 0951   CALCIUM 9.0 01/12/2013 0340   CALCIUM 9.6 10/11/2010 1605   GFRNONAA 17* 01/12/2013 0340   GFRAA 19* 01/12/2013 0340   CBC    Component Value Date/Time   WBC 10.0 01/11/2013 0610   RBC 4.03 01/11/2013 0610   HGB 11.7* 01/11/2013 0610   HCT 35.9* 01/11/2013 0610   PLT 237 01/11/2013 0610   MCV 89.1 01/11/2013 0610   MCH 29.0 01/11/2013 0610   MCHC 32.6 01/11/2013 0610   RDW 16.6* 01/11/2013 0610   LYMPHSABS 1.8 01/04/2013 2315   MONOABS 0.7 01/04/2013 2315   EOSABS 0.5 01/04/2013 2315   BASOSABS 0.0 01/04/2013 2315     Assessment/Plan:  1. CHF/CMP- improved with HD and UF. HR has improved as well as UOP  1. responding to po  lasix. 2. AKI/CKD due to cardiorenal syndrome:  1. She did well with short/slow HD but agree that given her advanced age and multiple irreversible comorbidities, she would not be a good candidate for longterm HD 2. Increased UOP post UF so hopefully is in more favorable part of Starling curve in cardiorenal syndrome 3. No indication for HD at this time. 4. Cont to follow UOP and daily Scr.  5. Appreciate palliative care consult. Pt now DNR and no HD  3. Hyperkalemia: Improved post HD 4. Metabolic acidosis- improved post HD 5. Anemia: on IV Iron and ESA.  6. CKD-MBD: improving. On binders and follow 7. Hypothyroidism- prior h/o myxedema. TSH stable 8. Nutrition: improving 9. Hypertension:stable 10. Vascular access- new RIJ PC. Will have her f/u with Dr. Darrick Penna who knows her from outpt f/u and they can decide whether or not to proceed with L AVG with Dr. Arbie Cookey or to discontinue PC and cont with conservative therapy without HD as an option.  11. Dispo- pt now amenable to short term rehab. D/w family and agree with palliative care evaluation in an 79yo with CMP on HD.  12.  Sage Kopera A

## 2013-01-13 ENCOUNTER — Inpatient Hospital Stay (HOSPITAL_COMMUNITY): Payer: Medicare Other

## 2013-01-13 LAB — RENAL FUNCTION PANEL
Albumin: 3.4 g/dL — ABNORMAL LOW (ref 3.5–5.2)
CO2: 31 mEq/L (ref 19–32)
Calcium: 8.7 mg/dL (ref 8.4–10.5)
Creatinine, Ser: 2.79 mg/dL — ABNORMAL HIGH (ref 0.50–1.10)
GFR calc Af Amer: 16 mL/min — ABNORMAL LOW (ref >90)
GFR calc non Af Amer: 14 mL/min — ABNORMAL LOW (ref >90)
Glucose, Bld: 152 mg/dL — ABNORMAL HIGH (ref 70–99)
Phosphorus: 2.2 mg/dL — ABNORMAL LOW (ref 2.3–4.6)

## 2013-01-13 LAB — GLUCOSE, CAPILLARY
Glucose-Capillary: 151 mg/dL — ABNORMAL HIGH (ref 70–99)
Glucose-Capillary: 122 mg/dL — ABNORMAL HIGH (ref 70–99)
Glucose-Capillary: 127 mg/dL — ABNORMAL HIGH (ref 70–99)

## 2013-01-13 NOTE — Progress Notes (Signed)
Patient ID: [name], female   DOB: [birth year], 79 y.o.   MRN: [id number] S:no new complaints O:BP 136/70  Pulse 75  Temp(Src) 98.9 F (37.2 C) (Oral)  Resp 20  Ht 4\' 11"  (1.499 m)  Wt 58.7 kg (129 lb 6.6 oz)  BMI 26.12 kg/m2  SpO2 96%  Intake/Output Summary (Last 24 hours) at 01/13/13 1116 Last data filed at 01/13/13 0900  Gross per 24 hour  Intake   1220 ml  Output    900 ml  Net    320 ml   Intake/Output: I/O last 3 completed shifts: In: 863 [P.O.:860; I.V.:3] Out: 1450 [Urine:1450]  Intake/Output this shift:  Total I/O In: 360 [P.O.:360] Out: -  Weight change: -0.5 kg (-1 lb 1.6 oz) WUJ:WJXBJYN AAF in NAD CVS:no rub Resp:cta WGN:FAOZHY Ext:s/p L BKA, no edema, RIJ PC in place, clotted AVF   Recent Labs Lab 01/07/13 0412 01/08/13 0420 01/09/13 0625 01/10/13 0500 01/11/13 0610 01/12/13 0340 01/13/13 0355  NA 130* 137 136 133* 136 133* 132*  K 4.7 4.1 3.9 3.3* 3.3* 3.8 4.0  CL 91* 96 95* 92* 92* 89* 90*  CO2 23 30 28 29  32 31 31  GLUCOSE 189* 151* 113* 131* 128* 128* 152*  BUN 60* 32* 19 26* 28* 32* 41*  CREATININE 3.53* 2.51* 1.83* 2.17* 2.34* 2.44* 2.79*  ALBUMIN 3.6 3.6 3.4* 3.4* 3.6 3.5 3.4*  CALCIUM 8.0* 8.1* 8.8 8.9 9.2 9.0 8.7  PHOS 5.7* 3.9 2.4 2.5 2.6 2.8 2.2*   Liver Function Tests:  Recent Labs Lab 01/11/13 0610 01/12/13 0340 01/13/13 0355  ALBUMIN 3.6 3.5 3.4*   No results found for this basename: LIPASE, AMYLASE,  in the last 168 hours No results found for this basename: AMMONIA,  in the last 168 hours CBC:  Recent Labs Lab 01/07/13 1048 01/11/13 0610  WBC 9.8 10.0  HGB 9.3* 11.7*  HCT 28.0* 35.9*  MCV 85.9 89.1  PLT 275 237   Cardiac Enzymes: No results found for this basename: CKTOTAL, CKMB, CKMBINDEX, TROPONINI,  in the last 168 hours CBG:  Recent Labs Lab 01/12/13 0600 01/12/13 1104 01/12/13 1553 01/12/13 2057 01/13/13 0608  GLUCAP 134* 242* 107* 189* 151*    Iron Studies: No results found for this  basename: IRON, TIBC, TRANSFERRIN, FERRITIN,  in the last 72 hours Studies/Results: No results found. Marland Kitchen allopurinol  100 mg Oral Daily  . amLODipine  10 mg Oral Daily  . aspirin EC  81 mg Oral Daily  . calcium acetate  667 mg Oral TID WC  . carvedilol  6.25 mg Oral BID WC  . darbepoetin (ARANESP) injection - DIALYSIS  60 mcg Intravenous Q Mon-HD  . docusate sodium  100 mg Oral BID  . enoxaparin (LOVENOX) injection  30 mg Subcutaneous Daily  . ferric gluconate (FERRLECIT/NULECIT) IV  125 mg Intravenous Q M,W,F-HD  . furosemide  40 mg Oral BID  . gabapentin  300 mg Oral QHS  . hydrALAZINE  25 mg Oral Q8H  . insulin aspart  0-5 Units Subcutaneous QHS  . insulin aspart  0-9 Units Subcutaneous TID WC  . insulin glargine  5 Units Subcutaneous Daily  . isosorbide mononitrate  60 mg Oral Daily  . levothyroxine  75 mcg Oral QAC breakfast  . multivitamin  1 tablet Oral QHS  . polyethylene glycol  17 g Oral BID  . simvastatin  20 mg Oral q1800  . sodium chloride  3 mL Intravenous Q12H  . Travoprost (BAK  Free)  1 drop Both Eyes QHS    BMET    Component Value Date/Time   NA 132* 01/13/2013 0355   K 4.0 01/13/2013 0355   CL 90* 01/13/2013 0355   CO2 31 01/13/2013 0355   GLUCOSE 152* 01/13/2013 0355   BUN 41* 01/13/2013 0355   CREATININE 2.79* 01/13/2013 0355   CREATININE 2.70* 01/01/2013 0951   CALCIUM 8.7 01/13/2013 0355   CALCIUM 9.6 10/11/2010 1605   GFRNONAA 14* 01/13/2013 0355   GFRAA 16* 01/13/2013 0355   CBC    Component Value Date/Time   WBC 10.0 01/11/2013 0610   RBC 4.03 01/11/2013 0610   HGB 11.7* 01/11/2013 0610   HCT 35.9* 01/11/2013 0610   PLT 237 01/11/2013 0610   MCV 89.1 01/11/2013 0610   MCH 29.0 01/11/2013 0610   MCHC 32.6 01/11/2013 0610   RDW 16.6* 01/11/2013 0610   LYMPHSABS 1.8 01/04/2013 2315   MONOABS 0.7 01/04/2013 2315   EOSABS 0.5 01/04/2013 2315   BASOSABS 0.0 01/04/2013 2315     Assessment/Plan:  1. CHF/CMP- improved with HD and UF. HR has  improved as well as UOP  1. responding to po lasix. 2. AKI/CKD due to cardiorenal syndrome:  1. She did well with short/slow HD but agree that given her advanced age and multiple irreversible comorbidities, she would not be a good candidate for longterm HD, however her primary nephrologist, Dr. Darrick Penna will be taking over the service tomorrow and can help ascertain if palliative care is the appropriate step, as he has followed her for some time as an outpt. 2. Increased UOP post UF so hopefully is in more favorable part of Starling curve in cardiorenal syndrome 3. No indication for HD at this time. 4. Cont to follow UOP and daily Scr.  5. Appreciate palliative care consult. Pt now DNR and no HD  3. Hyperkalemia: Improved post HD 4. Metabolic acidosis- improved post HD 5. Anemia: on IV Iron and ESA.  6. CKD-MBD: improving. On binders and follow 7. Hypothyroidism- prior h/o myxedema. TSH stable 8. Nutrition: improving 9. Hypertension:stable 10. Vascular access- clotted LUE AVF, now s/p new RIJ PC. Tomorrow, will have her discuss with Dr. Darrick Penna who knows her from outpt f/u and they can decide whether or not to proceed with L AVG with Dr. Arbie Cookey or to discontinue PC and cont with conservative therapy without HD as an option.  11. Dispo- pt now amenable to short term rehab. D/w family and agree with palliative care evaluation in an 79yo with CMP on HD.  12.   Patra Gherardi A

## 2013-01-13 NOTE — Progress Notes (Signed)
[facility] PROGRESS NOTE  [name] ZOX:[id number] DOB: Jan 10, 1924 DOA: 01/04/2013  PCP: Georgianne Fick, MD  Brief HPI: [name] is a 79 y.o. female with Multiple Medical Problems including CAD, CHF, Myxedema Cardiomyopathy and CKD Stage III- V, who was brought to the ED with complaints of worsening SOB and Wheezing. EMS was called to her home and found her O2 sats to be 70% and she was placed on supplemental O2, and had mild improvement. She was found to have findings of CHF. She has acute on chronic renal failure. She did not diurese with Lasix and required initiation of dialysis after a temp dialysis catheter was placed. She is now stable.   Past medical history:  Past Medical History  Diagnosis Date  . Coronary artery disease   . TIA (transient ischemic attack)   . Hypertension   . Shingles   . Stroke   . Anemia   . CAD (coronary artery disease) 02/08/2011  . Cardiomyopathy, idiopathic 02/08/2011  . Chronic kidney disease   . Angina     Takes Isosorbide  . Unstable angina 02/08/2011  . Myocardial infarct     x 3 unsure of years  . Irregular heartbeat   . Ulcer   . GERD (gastroesophageal reflux disease)     takes Protonix and zantac  . Hx of transient ischemic attack (TIA)   . Memory loss   . CHF (congestive heart failure)     Takes Lasix  . Gout     takes allopurinol  . Peripheral vascular disease     left lower  leg  . Diabetes mellitus     type 2 NIDDM x 2 years; no meds  . Chronic kidney disease (CKD), stage IV (severe)   . Arthritis   . Shortness of breath   . Thyroid disease   . Hypothyroidism     (SEVERE) Takes Levothryroxine  . Hypothyroid 02/08/2011  . Nonischemic cardiomyopathy     EF now is 55%, reduced due to myxedema, which is improved.  . Dyslipidemia   . Peripheral neuropathy   . H/O echocardiogram 09/06/11    Indication- nonIschemic Cardiomyopathy. EF = now greater than 55% with no regional wall motion abnormalities. Tthere  is mild to moderate trisuspid regurgitayion and mild pulmonary hypertension with an RVSP of 35 mmHg as well as stage 1 diastolic dysfunction and mild to moderate LVH.  Marland Kitchen Abnormal nuclear stress test 06/01/09    Demonstrated a new area of infarct scar, peri-infarct ischemia seen in the inferolateral territory. EF eas normal at 70% with mild hypocontractility at the apex, distal inferolateral wall.    Consultants:  Vascular  Cards  nephro PMT  Procedures:  HD  Right IJ HD cath- 10/19  Antibiotics:  None  Subjective: Patient continues to feel well. Still no BM though trying to have one now.  Objective: Vital Signs  Filed Vitals:   01/12/13 0546 01/12/13 1436 01/12/13 2022 01/13/13 0300  BP: 139/69 122/51 149/52 136/70  Pulse: 71 72 85 75  Temp: 98.7 F (37.1 C) 98.7 F (37.1 C) 98.2 F (36.8 C) 98.9 F (37.2 C)  TempSrc: Oral Oral Oral Oral  Resp: 18 19 20 20   Height:      Weight: 59.2 kg (130 lb 8.2 oz)   58.7 kg (129 lb 6.6 oz)  SpO2: 94% 97% 91% 96%    Filed Weights   01/11/13 0456 01/12/13 0546 01/13/13 0300  Weight: 58.968 kg (130 lb) 59.2 kg (130 lb 8.2  oz) 58.7 kg (129 lb 6.6 oz)    Intake/Output from previous day: 10/25 0701 - 10/26 0700 In: 860 [P.O.:860] Out: 900 [Urine:900]  General appearance: alert, cooperative, appears stated age and no distress Resp: diminished air entry at bases. No crackles or wheezing. Cardio: regular rate and rhythm, S1, S2 normal, no murmur, click, rub or gallop GI: soft, slightly tender diffusely without rebound rigidity or guarding. bowel sounds normal; no masses,  no organomegaly Extremities: extremities normal, atraumatic, no cyanosis or edema and left BKA Neurologic: alert and oriented x 3. No focal deficits.  Lab Results:  Basic Metabolic Panel:  Recent Labs Lab 01/09/13 0625 01/10/13 0500 01/11/13 0610 01/12/13 0340 01/13/13 0355  NA 136 133* 136 133* 132*  K 3.9 3.3* 3.3* 3.8 4.0  CL 95* 92* 92* 89* 90*   CO2 28 29 32 31 31  GLUCOSE 113* 131* 128* 128* 152*  BUN 19 26* 28* 32* 41*  CREATININE 1.83* 2.17* 2.34* 2.44* 2.79*  CALCIUM 8.8 8.9 9.2 9.0 8.7  PHOS 2.4 2.5 2.6 2.8 2.2*   Liver Function Tests:  Recent Labs Lab 01/09/13 0625 01/10/13 0500 01/11/13 0610 01/12/13 0340 01/13/13 0355  ALBUMIN 3.4* 3.4* 3.6 3.5 3.4*   CBC:  Recent Labs Lab 01/07/13 1048 01/11/13 0610  WBC 9.8 10.0  HGB 9.3* 11.7*  HCT 28.0* 35.9*  MCV 85.9 89.1  PLT 275 237   BNP (last 3 results)  Recent Labs  01/04/13 2315  PROBNP 16572.0*   CBG:  Recent Labs Lab 01/12/13 1104 01/12/13 1553 01/12/13 2057 01/13/13 0608 01/13/13 1104  GLUCAP 242* 107* 189* 151* 212*    Recent Results (from the past 240 hour(s))  MRSA PCR SCREENING     Status: None   Collection Time    01/05/13  2:02 AM      Result Value Range Status   MRSA by PCR NEGATIVE  NEGATIVE Final   Comment:            The GeneXpert MRSA Assay (FDA     approved for NASAL specimens     only), is one component of a     comprehensive MRSA colonization     surveillance program. It is not     intended to diagnose MRSA     infection nor to guide or     monitor treatment for     MRSA infections.  URINE CULTURE     Status: None   Collection Time    01/05/13  3:02 AM      Result Value Range Status   Specimen Description URINE, CATHETERIZED   Final   Special Requests NONE   Final   Culture  Setup Time     Final   Value: 01/05/2013 16:13     Performed at [facility] Count     Final   Value: NO GROWTH     Performed at [facility]   Culture     Final   Value: NO GROWTH     Performed at [facility]   Report Status 01/06/2013 FINAL   Final      Studies/Results: No results found.  Medications:  Scheduled: . allopurinol  100 mg Oral Daily  . amLODipine  10 mg Oral Daily  . aspirin EC  81 mg Oral Daily  . calcium acetate  667 mg Oral TID WC  . carvedilol  6.25 mg Oral BID WC  .  darbepoetin (ARANESP) injection - DIALYSIS  60 mcg Intravenous Q Mon-HD  . docusate sodium  100 mg Oral BID  . enoxaparin (LOVENOX) injection  30 mg Subcutaneous Daily  . ferric gluconate (FERRLECIT/NULECIT) IV  125 mg Intravenous Q M,W,F-HD  . furosemide  40 mg Oral BID  . gabapentin  300 mg Oral QHS  . hydrALAZINE  25 mg Oral Q8H  . insulin aspart  0-5 Units Subcutaneous QHS  . insulin aspart  0-9 Units Subcutaneous TID WC  . insulin glargine  5 Units Subcutaneous Daily  . isosorbide mononitrate  60 mg Oral Daily  . levothyroxine  75 mcg Oral QAC breakfast  . multivitamin  1 tablet Oral QHS  . polyethylene glycol  17 g Oral BID  . simvastatin  20 mg Oral q1800  . sodium chloride  3 mL Intravenous Q12H  . Travoprost (BAK Free)  1 drop Both Eyes QHS   Continuous: . 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 Nephrology following. Underwent dialysis 10/21. No further plans for dialysis as renal function has improved. Changed to oral Lasix. IJ dialysis cathter placed by  vascular. Nephrology feels patient may not be a good long term dialysis candidate. Patient states that she will go for dialysis if needed. PMT also feels long term dialysis may not be a good option. Since renal function has improved she does not need long term dialysis at this time. This can be discussed further as an outpatient. Dr. Darrick Penna to weigh in tomorrow.  Acute on Chronic Diastolic CHF Normal systolic EF. Improved with dialysis. Cards has signed off.   Hypertension BP improved. On Amlodipine, Imdur, BB and Hydralazine.  DM 2 CBG reasonably well controlled. On SSI and low dose lantus. Was on Saxagliptin at home. A1C is 7.7.  Constipation Still no BM. Check AXR. Continue stool softeners and laxatives. TSH normal.    Bradycardia  Improved once K was corrected with dialysis. Now stable on coreg.  History of CAD Stable. Holding Plavix due to bleeding from HD catheter site. On Coreg and statin. Plavix can be resumed at discharge.  Hypothyroidism Prior h/o myxedema.TSH 4.2   PAD s/p left BKA in past Stable   Hyperkalemia Improved with dialysis   Anemia  Stable  Code Status: Full Code DVT Prophylaxis: Lovenox Family Communication: Discussed with patient.  Disposition Plan: To SNF on 10/27 once cleared by Nephrology.     LOS: 9 days   [facility]  [facility] Pager [phone] 01/13/2013, 11:49 AM  If 8PM-8AM, please contact night-coverage at [email or web address], password [facility]

## 2013-01-14 ENCOUNTER — Other Ambulatory Visit: Payer: Self-pay | Admitting: *Deleted

## 2013-01-14 DIAGNOSIS — I5023 Acute on chronic systolic (congestive) heart failure: Secondary | ICD-10-CM

## 2013-01-14 LAB — GLUCOSE, CAPILLARY: Glucose-Capillary: 179 mg/dL — ABNORMAL HIGH (ref 70–99)

## 2013-01-14 LAB — RENAL FUNCTION PANEL
Albumin: 3.4 g/dL — ABNORMAL LOW (ref 3.5–5.2)
Calcium: 9.6 mg/dL (ref 8.4–10.5)
GFR calc Af Amer: 15 mL/min — ABNORMAL LOW (ref >90)
GFR calc non Af Amer: 13 mL/min — ABNORMAL LOW (ref >90)
Glucose, Bld: 138 mg/dL — ABNORMAL HIGH (ref 70–99)
Phosphorus: 3.7 mg/dL (ref 2.3–4.6)
Potassium: 4.6 mEq/L (ref 3.5–5.1)
Sodium: 133 mEq/L — ABNORMAL LOW (ref 135–145)

## 2013-01-14 LAB — CBC
HCT: 33.7 % — ABNORMAL LOW (ref 36.0–46.0)
MCH: 28.6 pg (ref 26.0–34.0)
MCHC: 31.8 g/dL (ref 30.0–36.0)
MCV: 90.1 fL (ref 78.0–100.0)
Platelets: 249 10*3/uL (ref 150–400)
RDW: 17.3 % — ABNORMAL HIGH (ref 11.5–15.5)

## 2013-01-14 NOTE — Progress Notes (Signed)
[facility] PROGRESS NOTE  [name] ZOX:[id number] DOB: 08-02-1923 DOA: 01/04/2013  PCP: Shannon Fick, MD  Brief HPI: [name] is a 79 y.o. female with Multiple Medical Problems including CAD, CHF, Myxedema Cardiomyopathy and CKD Stage III- V, who was brought to the ED with complaints of worsening SOB and Wheezing. EMS was called to her home and found her O2 sats to be 70% and she was placed on supplemental O2, and had mild improvement. She was found to have findings of CHF. She has acute on chronic renal failure. She did not diurese with Lasix and required initiation of dialysis after a temp dialysis catheter was placed. She is now stable.   Past medical history:  Past Medical History  Diagnosis Date  . Coronary artery disease   . TIA (transient ischemic attack)   . Hypertension   . Shingles   . Stroke   . Anemia   . CAD (coronary artery disease) 02/08/2011  . Cardiomyopathy, idiopathic 02/08/2011  . Chronic kidney disease   . Angina     Takes Isosorbide  . Unstable angina 02/08/2011  . Myocardial infarct     x 3 unsure of years  . Irregular heartbeat   . Ulcer   . GERD (gastroesophageal reflux disease)     takes Protonix and zantac  . Hx of transient ischemic attack (TIA)   . Memory loss   . CHF (congestive heart failure)     Takes Lasix  . Gout     takes allopurinol  . Peripheral vascular disease     left lower  leg  . Diabetes mellitus     type 2 NIDDM x 2 years; no meds  . Chronic kidney disease (CKD), stage IV (severe)   . Arthritis   . Shortness of breath   . Thyroid disease   . Hypothyroidism     (SEVERE) Takes Levothryroxine  . Hypothyroid 02/08/2011  . Nonischemic cardiomyopathy     EF now is 55%, reduced due to myxedema, which is improved.  . Dyslipidemia   . Peripheral neuropathy   . H/O echocardiogram 09/06/11    Indication- nonIschemic Cardiomyopathy. EF = now greater than 55% with no regional wall motion abnormalities. Tthere  is mild to moderate trisuspid regurgitayion and mild pulmonary hypertension with an RVSP of 35 mmHg as well as stage 1 diastolic dysfunction and mild to moderate LVH.  Marland Kitchen Abnormal nuclear stress test 06/01/09    Demonstrated a new area of infarct scar, peri-infarct ischemia seen in the inferolateral territory. EF eas normal at 70% with mild hypocontractility at the apex, distal inferolateral wall.    Consultants:  Vascular  Cards  nephro PMT  Procedures:  HD  Right IJ HD cath- 10/19  Antibiotics:  None  Subjective: Patient continues to feel well. Finally was able to have a BM yesterday.   Objective: Vital Signs  Filed Vitals:   01/13/13 2034 01/14/13 0524 01/14/13 0807 01/14/13 1055  BP: 134/45  147/57 152/62  Pulse: 78 74 82 76  Temp: 98.5 F (36.9 C) 98.7 F (37.1 C)    TempSrc: Oral Oral    Resp: 20 20    Height:      Weight:  58.4 kg (128 lb 12 oz)    SpO2: 94% 100%      Filed Weights   01/12/13 0546 01/13/13 0300 01/14/13 0524  Weight: 59.2 kg (130 lb 8.2 oz) 58.7 kg (129 lb 6.6 oz) 58.4 kg (128 lb 12 oz)  Intake/Output from previous day: 2023-02-03 0701 - 10/27 0700 In: 600 [P.O.:600] Out: 901 [Urine:900; Stool:1]  General appearance: alert, cooperative, appears stated age and no distress Resp: Improved air entry. No crackles or wheezing. Cardio: regular rate and rhythm, S1, S2 normal, no murmur, click, rub or gallop GI: soft, slightly tender diffusely without rebound rigidity or guarding. bowel sounds normal; no masses,  no organomegaly Extremities: extremities normal, atraumatic, no cyanosis or edema and left BKA Neurologic: alert and oriented x 3. No focal deficits.  Lab Results:  Basic Metabolic Panel:  Recent Labs Lab 01/10/13 0500 01/11/13 0610 01/12/13 0340 February 02, 2013 0355 01/14/13 0610  NA 133* 136 133* 132* 133*  K 3.3* 3.3* 3.8 4.0 4.6  CL 92* 92* 89* 90* 90*  CO2 29 32 31 31 30   GLUCOSE 131* 128* 128* 152* 138*  BUN 26* 28* 32* 41* 42*   CREATININE 2.17* 2.34* 2.44* 2.79* 3.02*  CALCIUM 8.9 9.2 9.0 8.7 9.6  PHOS 2.5 2.6 2.8 2.2* 3.7   Liver Function Tests:  Recent Labs Lab 01/10/13 0500 01/11/13 0610 01/12/13 0340 Feb 02, 2013 0355 01/14/13 0610  ALBUMIN 3.4* 3.6 3.5 3.4* 3.4*   CBC:  Recent Labs Lab 01/11/13 0610 01/14/13 0610  WBC 10.0 8.2  HGB 11.7* 10.7*  HCT 35.9* 33.7*  MCV 89.1 90.1  PLT 237 249   BNP (last 3 results)  Recent Labs  01/04/13 2315  PROBNP 16572.0*   CBG:  Recent Labs Lab Feb 02, 2013 1104 02-Feb-2013 1655 02/02/2013 2058 01/14/13 0612 01/14/13 1120  GLUCAP 212* 122* 127* 143* 205*    Recent Results (from the past 240 hour(s))  MRSA PCR SCREENING     Status: None   Collection Time    01/05/13  2:02 AM      Result Value Range Status   MRSA by PCR NEGATIVE  NEGATIVE Final   Comment:            The GeneXpert MRSA Assay (FDA     approved for NASAL specimens     only), is one component of a     comprehensive MRSA colonization     surveillance program. It is not     intended to diagnose MRSA     infection nor to guide or     monitor treatment for     MRSA infections.  URINE CULTURE     Status: None   Collection Time    01/05/13  3:02 AM      Result Value Range Status   Specimen Description URINE, CATHETERIZED   Final   Special Requests NONE   Final   Culture  Setup Time     Final   Value: 01/05/2013 16:13     Performed at [facility]   [place] Count     Final   Value: NO GROWTH     Performed at [facility]   Culture     Final   Value: NO GROWTH     Performed at [facility]   Report Status 01/06/2013 FINAL   Final      Studies/Results: Dg Abd 2 Views  2013-02-02   CLINICAL DATA:  Abdominal pain, constipation.  EXAM: ABDOMEN - 2 VIEW  COMPARISON:  01/06/2013 and earlier studies  FINDINGS: Tunneled right IJ hemodialysis catheter extends to the proximal right atrium. Improved aeration with mild residual atelectasis or infiltrate at the left  lung base. No definite effusion or overt edema. Heart size upper limits normal. No free air. Scattered  gas-filled nondilated small bowel loops and colon. Pelvic vascular calcifications. Degenerative disc disease in the mid and lower lumbar spine.  IMPRESSION: 1. Nonobstructive bowel gas pattern. 2. No free air. 3. Improved lung aeration with residual left lower lung infiltrate/atelectasis.   Electronically Signed   By: Oley Balm M.D.   On: 01/13/2013 18:16    Medications:  Scheduled: . allopurinol  100 mg Oral Daily  . amLODipine  10 mg Oral Daily  . aspirin EC  81 mg Oral Daily  . calcium acetate  667 mg Oral TID WC  . carvedilol  6.25 mg Oral BID WC  . darbepoetin (ARANESP) injection - DIALYSIS  60 mcg Intravenous Q Mon-HD  . docusate sodium  100 mg Oral BID  . enoxaparin (LOVENOX) injection  30 mg Subcutaneous Daily  . ferric gluconate (FERRLECIT/NULECIT) IV  125 mg Intravenous Q M,W,F-HD  . furosemide  40 mg Oral BID  . gabapentin  300 mg Oral QHS  . hydrALAZINE  25 mg Oral Q8H  . insulin aspart  0-5 Units Subcutaneous QHS  . insulin aspart  0-9 Units Subcutaneous TID WC  . insulin glargine  5 Units Subcutaneous Daily  . isosorbide mononitrate  60 mg Oral Daily  . levothyroxine  75 mcg Oral QAC breakfast  . multivitamin  1 tablet Oral QHS  . polyethylene glycol  17 g Oral BID  . simvastatin  20 mg Oral q1800  . sodium chloride  3 mL Intravenous Q12H  . Travoprost (BAK Free)  1 drop Both Eyes QHS   Continuous: . 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 Discussed with Dr. Lowell Guitar regarding worsening creatinine. He will evaluate and make recommendations. Underwent dialysis 10/21. No further plans for dialysis as renal function has improved. But this could change as creatinine is worsening. Now on oral Lasix. IJ dialysis cathter placed by vascular. Nephrology feels patient may not be a good long term dialysis candidate. But patient states that she will go for dialysis if needed. PMT also feels long term dialysis may not be a good option. Since renal function had improved last week it was felt she doesn't need long term dialysis currently. But this might change.   Acute on Chronic Diastolic CHF Normal systolic EF. Improved with dialysis. Cards has signed off.   Hypertension BP improved. On Amlodipine, Imdur, BB and Hydralazine.  DM 2 CBG reasonably well controlled. On SSI and low dose lantus. Was on Saxagliptin at home. A1C is 7.7.  Constipation Still no BM. Check AXR. Continue stool softeners and laxatives. TSH normal.    Bradycardia  Improved once K was corrected  with dialysis. Now stable on coreg.  History of CAD Stable. Holding Plavix due to bleeding from HD catheter site. On Coreg and statin. Plavix can be resumed at discharge.  Hypothyroidism Prior h/o myxedema.TSH 4.2   PAD s/p left BKA in past Stable   Hyperkalemia Improved with dialysis   Anemia  Stable  Code Status: Full Code DVT Prophylaxis: Lovenox Family  Communication: Discussed with patient.  Disposition Plan: Will need SNF but timing remains unclear with rising creatinine. Await Nephrology input today. Discussed with CSW as well. Plan will be for 10/28 if no change in treatment plan.    LOS: 10 days   [facility]  [facility] Pager [phone] 01/14/2013, 12:01 PM  If 8PM-8AM, please contact night-coverage at [email or web address], password [facility]

## 2013-01-14 NOTE — Progress Notes (Signed)
CSW note of 01/14/13 at 12:31 entered by me was entered on wrong patient. [name]. [place] P[place]  [phone]

## 2013-01-14 NOTE — Progress Notes (Signed)
Assessment/Plan:  1. CHF/CMP- improved with HD and UF. 1. responded to po lasix. 2. AKI/CKD due to cardiorenal syndrome:? New baseline  Plan: I spoke with Dr. Darrick Penna who feels patient is adequate candidate for HD.  He would like [place] to arrange access placement with [facility](Dr. Early) and I will ask them to see her.  It can be done as an out patient.  Would get labs weekly and we will arrange follow up with Dr. Darrick Penna     Subjective: Interval History: o complaints  Objective: Vital signs in last 24 hours: Temp:  [98.4 F (36.9 [name])-98.7 F (37.1 [name])] 98.7 F (37.1 [name]) (10/27 0524) Pulse Rate:  [54-82] 76 (10/27 1055) Resp:  [18-20] 20 (10/27 0524) BP: (134-152)/(45-62) 152/62 mmHg (10/27 1055) SpO2:  [94 %-100 %] 100 % (10/27 0524) Weight:  [58.4 kg (128 lb 12 oz)] 58.4 kg (128 lb 12 oz) (10/27 0524) Weight change: -0.3 kg (-10.6 oz)  Intake/Output from previous day: 01-21-2023 0701 - 10/27 0700 In: 600 [P.O.:600] Out: 901 [Urine:900; Stool:1] Intake/Output this shift: Total I/O In: 460 [P.O.:460] Out: 150 [Urine:150]  General appearance: alert and cooperative Resp: clear to auscultation bilaterally Cardio: regular rate and rhythm, S1, S2 normal, no murmur, click, rub or gallop Extremities: extremities normal, atraumatic, no cyanosis or edema and LLE BKA  Lab Results:  Recent Labs  01/14/13 0610  WBC 8.2  HGB 10.7*  HCT 33.7*  PLT 249   BMET:  Recent Labs  2013/01/20 0355 01/14/13 0610  NA 132* 133*  K 4.0 4.6  CL 90* 90*  CO2 31 30  GLUCOSE 152* 138*  BUN 41* 42*  CREATININE 2.79* 3.02*  CALCIUM 8.7 9.6   No results found for this basename: PTH,  in the last 72 hours Iron Studies: No results found for this basename: IRON, TIBC, TRANSFERRIN, FERRITIN,  in the last 72 hours Studies/Results: Dg Abd 2 Views  01-20-2013   CLINICAL DATA:  Abdominal pain, constipation.  EXAM: ABDOMEN - 2 VIEW  COMPARISON:  01/06/2013 and earlier studies  FINDINGS: Tunneled right IJ  hemodialysis catheter extends to the proximal right atrium. Improved aeration with mild residual atelectasis or infiltrate at the left lung base. No definite effusion or overt edema. Heart size upper limits normal. No free air. Scattered gas-filled nondilated small bowel loops and colon. Pelvic vascular calcifications. Degenerative disc disease in the mid and lower lumbar spine.  IMPRESSION: 1. Nonobstructive bowel gas pattern. 2. No free air. 3. Improved lung aeration with residual left lower lung infiltrate/atelectasis.   Electronically Signed   By: Oley Balm M.D.   On: 2013-01-20 18:16    Scheduled: . allopurinol  100 mg Oral Daily  . amLODipine  10 mg Oral Daily  . aspirin EC  81 mg Oral Daily  . calcium acetate  667 mg Oral TID WC  . carvedilol  6.25 mg Oral BID WC  . darbepoetin (ARANESP) injection - DIALYSIS  60 mcg Intravenous Q Mon-HD  . docusate sodium  100 mg Oral BID  . enoxaparin (LOVENOX) injection  30 mg Subcutaneous Daily  . ferric gluconate (FERRLECIT/NULECIT) IV  125 mg Intravenous Q M,W,F-HD  . furosemide  40 mg Oral BID  . gabapentin  300 mg Oral QHS  . hydrALAZINE  25 mg Oral Q8H  . insulin aspart  0-5 Units Subcutaneous QHS  . insulin aspart  0-9 Units Subcutaneous TID WC  . insulin glargine  5 Units Subcutaneous Daily  . isosorbide mononitrate  60 mg  Oral Daily  . levothyroxine  75 mcg Oral QAC breakfast  . multivitamin  1 tablet Oral QHS  . polyethylene glycol  17 g Oral BID  . simvastatin  20 mg Oral q1800  . sodium chloride  3 mL Intravenous Q12H  . Travoprost (BAK Free)  1 drop Both Eyes QHS    LOS: 10 days   [name] 01/14/2013,1:17 PM

## 2013-01-14 NOTE — Progress Notes (Signed)
Patient has been given bed offers and have requested [facility].  CSW spoke to [place] at [facility]- needs clarification of dialysis status- will she continue dialysis or discontinue.  Patient stated that she is unsure and does not remember discussing this with MD.  CSW spoke with Dr. Rito Ehrlich and he will call back once he is able to clarify with nephrologist.  This decision will affect the SNF's decision to accept her or not.  [name]. [place]  [phone]

## 2013-01-14 NOTE — Consult Note (Signed)
Call by Dr Lowell Guitar to consider access in patient for hemodialysis.  Pt with prior basilic vein fistula not usable.  Currently has right side Diatek catheter.  Anticipated discharge tomorrow.  Dr Early previously evaluated patient about a week ago.  No current facility-administered medications on file prior to encounter.   Current Outpatient Prescriptions on File Prior to Encounter  Medication Sig Dispense Refill  . allopurinol (ZYLOPRIM) 100 MG tablet Take 100 mg by mouth daily.       . carvedilol (COREG) 12.5 MG tablet Take 1 tablet (12.5 mg total) by mouth 2 (two) times daily with a meal.  30 tablet  0  . clopidogrel (PLAVIX) 75 MG tablet Take 75 mg by mouth daily.      . furosemide (LASIX) 40 MG tablet Take 40 mg by mouth daily.       Marland Kitchen gabapentin (NEURONTIN) 300 MG capsule Take 300 mg by mouth 2 (two) times daily.      . isosorbide mononitrate (IMDUR) 60 MG 24 hr tablet Take 60 mg by mouth daily.       Marland Kitchen levothyroxine (SYNTHROID, LEVOTHROID) 75 MCG tablet Take 75 mcg by mouth daily before breakfast.      . nitroGLYCERIN (NITROSTAT) 0.4 MG SL tablet Place 0.4 mg under the tongue every 5 (five) minutes as needed for chest pain.          PE:  Filed Vitals:   01/14/13 0807 01/14/13 1055 01/14/13 1333 01/14/13 1701  BP: 147/57 152/62 132/60 145/52  Pulse: 82 76 80 81  Temp:   97.6 F (36.4 C)   TempSrc:   Oral   Resp:   18   Height:      Weight:      SpO2:   94%     Left upper arm fistula pulsatile Right neck diatek no hematoma  A: Needs left upper arm graft.  Pt wishes to discuss with her daughter prior to proceeding.  I gave the patient our office number to contact for scheduling.  Fabienne Bruns, MD [facility] Office: [phone] Pager: [phone]

## 2013-01-14 NOTE — Progress Notes (Deleted)
Per MD- patient is not medically stable for d/c.  [employer] will require new authorization once d/c date is determined.  CSW spoke with Marylene Land at [facility]- condition updated. Will continue to monitor.  [name]. [place]  [phone]

## 2013-01-15 ENCOUNTER — Encounter (HOSPITAL_COMMUNITY)
Admission: EM | Disposition: A | Discharge status: Skilled Nursing Facility | Payer: Self-pay | Source: Home / Self Care | Attending: Internal Medicine

## 2013-01-15 LAB — GLUCOSE, CAPILLARY
Glucose-Capillary: 164 mg/dL — ABNORMAL HIGH (ref 70–99)
Glucose-Capillary: 209 mg/dL — ABNORMAL HIGH (ref 70–99)

## 2013-01-15 LAB — RENAL FUNCTION PANEL
BUN: 46 mg/dL — ABNORMAL HIGH (ref 6–23)
Chloride: 90 mEq/L — ABNORMAL LOW (ref 96–112)
GFR calc Af Amer: 15 mL/min — ABNORMAL LOW (ref >90)
GFR calc non Af Amer: 13 mL/min — ABNORMAL LOW (ref >90)
Glucose, Bld: 150 mg/dL — ABNORMAL HIGH (ref 70–99)
Phosphorus: 3.8 mg/dL (ref 2.3–4.6)
Potassium: 4.5 mEq/L (ref 3.5–5.1)
Sodium: 131 mEq/L — ABNORMAL LOW (ref 135–145)

## 2013-01-15 SURGERY — INSERTION OF DIALYSIS CATHETER
Anesthesia: Monitor Anesthesia Care | Site: Neck

## 2013-01-15 MED ORDER — HYDROCODONE-ACETAMINOPHEN 5-325 MG PO TABS: 1.0000 | ORAL_TABLET | Freq: Four times a day (QID) | ORAL | Status: DC | PRN

## 2013-01-15 MED ORDER — CARVEDILOL 12.5 MG PO TABS: 6.2500 mg | ORAL_TABLET | Freq: Two times a day (BID) | ORAL | Status: DC

## 2013-01-15 MED ORDER — POLYETHYLENE GLYCOL 3350 17 G PO PACK: 17.0000 g | PACK | Freq: Every day | ORAL | Status: DC

## 2013-01-15 MED ORDER — FLEET ENEMA 7-19 GM/118ML RE ENEM: 1.0000 | ENEMA | Freq: Every day | RECTAL | Status: DC | PRN

## 2013-01-15 MED ORDER — INSULIN GLARGINE 100 UNIT/ML ~~LOC~~ SOLN: 5.0000 [IU] | Freq: Every day | SUBCUTANEOUS | Status: DC

## 2013-01-15 MED ORDER — RENA-VITE PO TABS: 1.0000 | ORAL_TABLET | Freq: Every day | ORAL | Status: DC

## 2013-01-15 MED ORDER — DSS 100 MG PO CAPS: 100.0000 mg | ORAL_CAPSULE | Freq: Two times a day (BID) | ORAL | Status: DC

## 2013-01-15 MED ORDER — GABAPENTIN 300 MG PO CAPS: 300.0000 mg | ORAL_CAPSULE | Freq: Every day | ORAL | Status: DC

## 2013-01-15 MED ORDER — ACETAMINOPHEN 325 MG PO TABS: 650.0000 mg | ORAL_TABLET | ORAL | Status: DC | PRN

## 2013-01-15 MED ORDER — HYDRALAZINE HCL 25 MG PO TABS: 25.0000 mg | ORAL_TABLET | Freq: Three times a day (TID) | ORAL | Status: DC

## 2013-01-15 MED ORDER — CALCIUM ACETATE 667 MG PO CAPS: 667.0000 mg | ORAL_CAPSULE | Freq: Three times a day (TID) | ORAL | Status: DC

## 2013-01-15 MED ORDER — FUROSEMIDE 40 MG PO TABS: 40.0000 mg | ORAL_TABLET | Freq: Two times a day (BID) | ORAL | Status: DC

## 2013-01-15 NOTE — Progress Notes (Signed)
CSW spoke to patient's daughters Suzette Battiest and  Franklin throughout the day. They had chosen bed offer from [facility] but they decided not to accept patient today.  Other bed offers given to daughters and they have visited several SNF's. Daughter Suzette Battiest called this CSW at about 5:15 pm and explained that they had not yet chosen a facility. CSW offered to extend bed search outside of [place] but daughter declined. CSW reminded daughter that patient was medically stable for dc and that a SNF bed must be chosen. CSW discussed possible option of home with home health but daughter relates that she and her sister cannot adequately care for patient at home at this time.  Daughter will contact SW tomorrow morning to give bed choice as it was too late today for d/c.  [name]. [place]  [phone]

## 2013-01-15 NOTE — Progress Notes (Signed)
Patient stable from renal perspective. Creatinine unchanged. BP ok Exam no change.  Please obtain weekly chemistries at SNF to be sent to Dr. Darrick Penna. We will arrange follow up appt with him. Please ask Short stay to flush hemodialysis catheter and change catheter dressing weekly. Thank you.  [name]

## 2013-01-15 NOTE — Progress Notes (Signed)
Physical Therapy Treatment Patient Details Name: [name] MRN: [id number] DOB: 05-Oct-1923 Today's Date: 01/15/2013 Time: 8295-6213 PT Time Calculation (min): 10 min  PT Assessment / Plan / Recommendation  History of Present Illness [name] is a 79 y.o. female with Multiple Medical Problems including L BKA, CAD, CHF, Myxedema Cardiomyopathy and CKD Stage III- V, who was brought to the ED with complaints of worsening SOB and Wheezing . Acute renal failure with initiation of HD.   PT Comments   Pt making steady progress toward goals with less assist and time needed for mobility today.  Follow Up Recommendations  SNF     Equipment Recommendations  None recommended by PT       Frequency Min 2X/week   Progress towards PT Goals Progress towards PT goals: Progressing toward goals  Plan Current plan remains appropriate    Precautions / Restrictions Precautions Precautions: Fall Precaution Comments: L BKA-no  prosthesis Restrictions Weight Bearing Restrictions: No       Mobility  Bed Mobility Supine to Sit: 5: Supervision;With rails;HOB elevated (bed elevated approx 30 degrees) Sitting - Scoot to Edge of Bed: 5: Supervision;With rail Details for Bed Mobility Assistance: heavy use of rail Transfers Squat Pivot Transfers: 4: Min assist;With armrests Details for Transfer Assistance: min assist with pt holding the arm rest of the recliner and using the other arm on bed surface to steady herself. once in chair pt able to scoot backwards reciprocally without assist or cues.      PT Goals (current goals can now be found in the care plan section) Acute Rehab PT Goals Patient Stated Goal: return home PT Goal Formulation: With patient Time For Goal Achievement: 01/22/13 Potential to Achieve Goals: Good  Visit Information  Last PT Received On: 01/15/13 Assistance Needed: +1 History of Present Illness: [name] is a 79 y.o. female with Multiple Medical Problems  including L BKA, CAD, CHF, Myxedema Cardiomyopathy and CKD Stage III- V, who was brought to the ED with complaints of worsening SOB and Wheezing . Acute renal failure with initiation of HD.    Subjective Data  Patient Stated Goal: return home   Cognition  Cognition Arousal/Alertness: Awake/alert Behavior During Therapy: WFL for tasks assessed/performed Overall Cognitive Status: Within Functional Limits for tasks assessed       End of Session PT - End of Session Equipment Utilized During Treatment: Gait belt Activity Tolerance: Patient tolerated treatment well Patient left: in chair;with call bell/phone within reach;with nursing/sitter in room (RN in room for meds) Nurse Communication: Mobility status   GP     Sallyanne Kuster 01/15/2013, 2:49 PM  Sallyanne Kuster, PTA Office- [phone]

## 2013-01-15 NOTE — Discharge Summary (Addendum)
[facility]  Physician Discharge Summary   Patient ID: Shannon EHRHARD MRN: [id number] DOB/AGE: [birth year] 79 y.o.  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 Renal Function Panel weekly starting 01/21/13 and send results to Dr. Darrick Penna with [facility] 2. Check CBG's three times a day 3. Flush hemodialysis catheter and change catheter dressing weekly   DISCHARGE CONDITION: fair  Diet recommendation: Mod carb/renal  Filed Weights   01/13/13 0300 01/14/13 0524 01/15/13 0709  Weight: 58.7 kg (129 lb 6.6 oz) 58.4 kg (128 lb 12 oz) 57.8 kg (127 lb 6.8 oz)    INITIAL HISTORY: [name] is a 79 y.o. female with Multiple Medical Problems including CAD, CHF, Myxedema Cardiomyopathy and CKD Stage III- V, who was brought to the ED with complaints of worsening SOB and Wheezing. EMS was called to her home and found her O2 sats to be 70% and she was placed on supplemental O2, and had mild improvement. She was found to have findings of CHF. She has acute on chronic renal failure. She did not diurese with Lasix and required initiation of dialysis after a temp dialysis catheter was placed. She is now stable.   Consultants:  Vascular  Cards  nephro  PMT  Procedures:  HD  Right IJ HD cath- 10/19   HOSPITAL COURSE:    AKI on CKD Stage 5/ESRD Patient was seen by nephrology. She underwent diuresis with not much significant improvement in renal function or urine output. She was subsequently dialysed.She then improved. Then she was maintained on IV Lasix with good UO. The lasix was changed to oral. Creatinine did climb some after dialysis but remains stable as of today. Nephrology will follow results of weekly labs and decide on further course of action. Will leave HD catheter in place. There are no plans for scheduled dialysis at this time. There concern is patient was a good dialysis candidate. She was seen by PMT who discussed this issue with her and patient wishes to proceed with dialysis if needed in the future.   Acute on Chronic Diastolic CHF  She has normal systolic EF. She did not improve with diuresis and required HD as discussed above. She was seen by [facility] heart and vascular physicians while she was hospitalized.  Hypertension  BP is stable on current regimen.   DM 2  CBG reasonably well controlled. Continue Lantus. Check CBG's at SNF. A1C is 7.7.   Constipation  Had a good BM last few days. AXR did not show any concerning findings. Continue stool softeners and laxatives. TSH normal.   Bradycardia  Improved once K was corrected with dialysis. Now stable on coreg.   History of CAD  This issue has been stable. Plavix was held due to bleeding from HD catheter site. Plavix will be resumed at discharge.   Hypothyroidism  Prior h/o myxedema.TSH 4.2   History of PAD s/p left BKA in past  Stable   Anemia  Stable  She is stable for discharge. She will go to SNF.   PERTINENT LABS:  The results of significant diagnostics from this hospitalization (including imaging, microbiology, ancillary and laboratory) are listed below for reference.     Labs: Basic Metabolic Panel:  Recent Labs Lab 01/11/13 0610 01/12/13 0340 01/13/13 0355 01/14/13 0610 01/15/13 0532  NA 136 133* 132*  133* 131*  K 3.3* 3.8 4.0 4.6 4.5  CL 92* 89* 90* 90* 90*  CO2 32 31 31 30 30   GLUCOSE 128* 128* 152* 138* 150*  BUN 28* 32* 41* 42* 46*  CREATININE 2.34* 2.44* 2.79* 3.02* 3.05*  CALCIUM 9.2 9.0 8.7 9.6 9.7  PHOS 2.6 2.8 2.2* 3.7 3.8   Liver Function Tests:  Recent Labs Lab 01/11/13 0610 01/12/13 0340 01/13/13 0355 01/14/13 0610 01/15/13 0532  ALBUMIN 3.6 3.5 3.4* 3.4* 3.5   CBC:  Recent Labs Lab 01/11/13 0610 01/14/13 0610  WBC 10.0 8.2  HGB 11.7* 10.7*  HCT 35.9* 33.7*  MCV 89.1 90.1  PLT 237 249   BNP: BNP (last 3 results)  Recent Labs  01/04/13 2315  PROBNP 16572.0*   CBG:  Recent Labs Lab 01/14/13 0612 01/14/13 1120 01/14/13 1557 01/14/13 2042 01/15/13 0707  GLUCAP 143* 205* 174* 179* 164*     IMAGING STUDIES Dg Chest Port 1 View  01/06/2013   CLINICAL DATA:  Central line placement  EXAM: PORTABLE CHEST - 1 VIEW  COMPARISON:  01/04/2013  FINDINGS: Right internal jugular dual-lumen tunneled central venous catheter has its tips in the right atrium. No pneumothorax.  Cardiac silhouette is mildly enlarged. Mediastinum is normal in contour. There is central vascular congestion. Lung base interstitial opacities are noted. Hazy lung base opacity suggests small effusions. No convincing infiltrate.  IMPRESSION: New right internal jugular dual-lumen central venous catheter has its tips in the right atrium. No pneumothorax.  Central vascular congestion appears increased from the prior exam and interstitial densities in the bases have also increased. This is consistent with mild pulmonary edema. There is evidence of small associated pleural effusions.   Electronically Signed   By: Amie Portland M.D.   On: 01/06/2013 11:30   Dg Chest Port 1 View  01/05/2013   CLINICAL DATA:  Shortness of breath  EXAM: PORTABLE CHEST - 1 VIEW  COMPARISON:  02/06/2012  FINDINGS: Cardiopericardial enlargement. Rightward deviation of the trachea at the level of the aortic knob,  accentuated from priors by portable technique. A similar appearance seen 10/07/2010. Remaining upper mediastinal contours stable.  Indistinct, streaky opacities in the lower lungs, left more than right. No overt edema. Possible small effusions. No pneumothorax.  IMPRESSION: Lower lung interstitial opacities which may represent atelectasis, pneumonitis, or infection.   Electronically Signed   By: Tiburcio Pea M.D.   On: 01/05/2013 00:13   Dg Abd 2 Views  01/13/2013   CLINICAL DATA:  Abdominal pain, constipation.  EXAM: ABDOMEN - 2 VIEW  COMPARISON:  01/06/2013 and earlier studies  FINDINGS: Tunneled right IJ hemodialysis catheter extends to the proximal right atrium. Improved aeration with mild residual atelectasis or infiltrate at the left lung base. No definite effusion or overt edema. Heart size upper limits normal. No free air. Scattered gas-filled nondilated small bowel loops and colon. Pelvic vascular calcifications. Degenerative disc disease in the mid and lower lumbar spine.  IMPRESSION: 1. Nonobstructive bowel gas pattern. 2. No  free air. 3. Improved lung aeration with residual left lower lung infiltrate/atelectasis.   Electronically Signed   By: Oley Balm M.D.   On: 01/13/2013 18:16   Dg Fluoro Guide Cv Line-no Report  01/06/2013   CLINICAL DATA: Diatek insertion   FLOURO GUIDE CV LINE  Fluoroscopy was utilized by the requesting physician.  No radiographic  interpretation.     DISCHARGE EXAMINATION: Filed Vitals:   01/14/13 1333 01/14/13 1701 01/14/13 2016 01/15/13 0709  BP: 132/60 145/52 133/46 146/52  Pulse: 80 81 74 70  Temp: 97.6 F (36.4 C)  98.3 F (36.8 C) 98.9 F (37.2 C)  TempSrc: Oral  Oral Oral  Resp: 18  19 18   Height:      Weight:    57.8 kg (127 lb 6.8 oz)  SpO2: 94%  94% 100%   General appearance: alert, cooperative, appears stated age and no distress Resp: clear to auscultation bilaterally with diminished air entry at bases. Cardio: regular rate and  rhythm, S1, S2 normal, no murmur, click, rub or gallop GI: soft, non-tender; bowel sounds normal; no masses,  no organomegaly Neurologic: No focal deficits  DISPOSITION: SNF  Discharge Orders   Future Appointments Provider Department Dept Phone   02/27/2013 12:30 PM Chuck Hint, MD [facility] [phone]   Future Orders Complete By Expires   Diet Carb Modified  As directed    Discharge diet:  As directed    Comments:     Renal   Discharge instructions  As directed    Comments:     Please check CBG three times daily and decide regarding SSI. Please obtain weekly lab: Renal Function Panel and send results to Dr. Darrick Penna with [facility].   Increase activity slowly  As directed       ALLERGIES:  Allergies  Allergen Reactions  . Aspirin Nausea And Vomiting    325 mg (adult strength) [name]     Current Discharge Medication List    START taking these medications   Details  acetaminophen (TYLENOL) 325 MG tablet Take 2 tablets (650 mg total) by mouth every 4 (four) hours as needed.    calcium acetate (PHOSLO) 667 MG capsule Take 1 capsule (667 mg total) by mouth 3 (three) times daily with meals.    docusate sodium 100 MG CAPS Take 100 mg by mouth 2 (two) times daily. Qty: 10 capsule, Refills: 0    hydrALAZINE (APRESOLINE) 25 MG tablet Take 1 tablet (25 mg total) by mouth every 8 (eight) hours.    HYDROcodone-acetaminophen (NORCO/VICODIN) 5-325 MG per tablet Take 1 tablet by mouth every 6 (six) hours as needed. Qty: 30 tablet, Refills: 0    insulin glargine (LANTUS) 100 UNIT/ML injection Inject 0.05 mLs (5 Units total) into the skin daily. Qty: 10 mL, Refills: 12    multivitamin (RENA-VIT) TABS tablet Take 1 tablet by mouth at bedtime. Refills: 0    polyethylene glycol (MIRALAX / GLYCOLAX) packet Take 17 g by mouth daily. Qty: 14 each, Refills: 0    sodium  phosphate (FLEET) 7-19 GM/118ML ENEM Place 1 enema rectally daily as needed. Refills: 0      CONTINUE these medications which have CHANGED   Details  carvedilol (COREG) 12.5 MG tablet Take 0.5 tablets (6.25 mg total) by mouth 2 (two) times daily with a meal. Qty: 30 tablet, Refills: 0    furosemide (LASIX) 40 MG tablet Take 1  tablet (40 mg total) by mouth 2 (two) times daily. Qty: 30 tablet    gabapentin (NEURONTIN) 300 MG capsule Take 1 capsule (300 mg total) by mouth at bedtime.      CONTINUE these medications which have NOT CHANGED   Details  allopurinol (ZYLOPRIM) 100 MG tablet Take 100 mg by mouth daily.     amLODipine (NORVASC) 10 MG tablet Take 10 mg by mouth daily.    aspirin 81 MG tablet Take 81 mg by mouth daily.    clopidogrel (PLAVIX) 75 MG tablet Take 75 mg by mouth daily.    isosorbide mononitrate (IMDUR) 60 MG 24 hr tablet Take 60 mg by mouth daily.     levothyroxine (SYNTHROID, LEVOTHROID) 75 MCG tablet Take 75 mcg by mouth daily before breakfast.    nitroGLYCERIN (NITROSTAT) 0.4 MG SL tablet Place 0.4 mg under the tongue every 5 (five) minutes as needed for chest pain.     pravastatin (PRAVACHOL) 40 MG tablet Take 40 mg by mouth daily.    travoprost, benzalkonium, (TRAVATAN) 0.004 % ophthalmic solution Place into both eyes at bedtime.      STOP taking these medications     guanFACINE (INTUNIV) 2 MG TB24 SR tablet      saxagliptin HCl (ONGLYZA) 5 MG TABS tablet        Follow-up Information   Follow up with [facility], MD. Schedule an appointment as soon as possible for a visit in 3 weeks.   Specialty:  Internal Medicine   Contact information:   [address] [place] [address] [place] [address] [phone]       Follow up with DETERDING,JAMES L, MD. Schedule an appointment as soon as possible for a visit in 2 weeks.   Specialty:  Nephrology   Contact information:   [address] [facility] [place]  [address] [phone]       Follow up with EARLY, TODD, MD. Schedule an appointment as soon as possible for a visit in 1 week.   Specialty:  Vascular Surgery   Contact information:   [address] [place] [address] [phone]       TOTAL DISCHARGE TIME: 35 mins  [facility]  [facility] Pager [phone]  01/15/2013, 8:50 AM

## 2013-01-15 NOTE — Progress Notes (Signed)
Palliative Medicine Team Progress Note  Patient record reviewed and plan of care has been determined. Her goals remain unchanged. Discharge plans in place to go to SNF with PCS. Please call or re-consult with additional needs.  Anderson Malta, DO Palliative Medicine

## 2013-01-16 ENCOUNTER — Ambulatory Visit (HOSPITAL_COMMUNITY): Payer: Medicare Other

## 2013-01-16 LAB — GLUCOSE, CAPILLARY
Glucose-Capillary: 162 mg/dL — ABNORMAL HIGH (ref 70–99)
Glucose-Capillary: 183 mg/dL — ABNORMAL HIGH (ref 70–99)

## 2013-01-16 NOTE — Progress Notes (Signed)
When chaplain entered the pt's room, two daughters and a grandson were at bedside.  Chaplain offered emotional support through his visit.   01/16/13 1500  Clinical Encounter Type  Visited With Patient and family together  Visit Type Spiritual support    [name]

## 2013-01-16 NOTE — Progress Notes (Signed)
[facility] PROGRESS NOTE  Assessment/Plan: AKI on CKD Stage 5/ESRD  - Patient was seen by nephrology. - Unsuccesfull diuresis with not much significant improvement in renal function. - She was subsequently dialysed.She then improved.  - She was maintained on IV Lasix with good urine output.  changed to oral.  - Nephrology will follow results of weekly labs and decide on further course of action.  - Will leave HD catheter in place. T -  She was seen by PMT who discussed this issue with her and patient wishes to proceed with dialysis if needed in the future.   Acute on Chronic Diastolic CHF  - She has normal systolic EF.  - She did not improve with diuresis and required HD as discussed above.  - She was seen by [facility] heart and vascular physicians while she was hospitalized.   Hypertension  - BP is stable on current regimen.   DM 2  - CBG reasonably well controlled. Continue Lantus. Check CBG's at SNF. A1C is 7.7.   Constipation  - Had a good BM last few days. AXR did not show any concerning findings. Continue stool softeners and laxatives. TSH normal.   Bradycardia  - Improved once K was corrected with dialysis. Now stable on coreg.   History of CAD  - This issue has been stable. Plavix was held due to bleeding from HD catheter site. Plavix will be resumed at discharge.   Hypothyroidism   Code Status: Full Code  DVT Prophylaxis: Lovenox  Family Communication: Discussed with patient.  Disposition Plan: Will need SNF.  Consultants:  nephrology  Procedures:  HD & HD catheter.  Abdominal x-ray  CXR  Echo  Antibiotics:  none  HPI/Subjective: No complains.  Objective: Filed Vitals:   01/15/13 1941 01/16/13 0425 01/16/13 0838 01/16/13 0839  BP: 135/51 147/53 135/44 135/44  Pulse: 77 80 80 80  Temp: 97.9 F (36.6 C) 99 F (37.2 C) 98.1 F (36.7 C)   TempSrc: Oral Oral Oral   Resp: 19 18 16    Height:      Weight:  57.8 kg (127 lb 6.8 oz)     SpO2: 100% 98% 99%     Intake/Output Summary (Last 24 hours) at 01/16/13 0949 Last data filed at 01/16/13 0836  Gross per 24 hour  Intake    600 ml  Output    620 ml  Net    -20 ml   Filed Weights   01/14/13 0524 01/15/13 0709 01/16/13 0425  Weight: 58.4 kg (128 lb 12 oz) 57.8 kg (127 lb 6.8 oz) 57.8 kg (127 lb 6.8 oz)    Exam:  General: Alert, awake, oriented x3, in no acute distress.  HEENT: No bruits, no goiter.  Heart: Regular rate and rhythm, without murmurs, rubs, gallops.  Lungs: Good air movement, bilateral air movement.    Data Reviewed: Basic Metabolic Panel:  Recent Labs Lab 01/11/13 0610 01/12/13 0340 01/13/13 0355 01/14/13 0610 01/15/13 0532  NA 136 133* 132* 133* 131*  K 3.3* 3.8 4.0 4.6 4.5  CL 92* 89* 90* 90* 90*  CO2 32 31 31 30 30   GLUCOSE 128* 128* 152* 138* 150*  BUN 28* 32* 41* 42* 46*  CREATININE 2.34* 2.44* 2.79* 3.02* 3.05*  CALCIUM 9.2 9.0 8.7 9.6 9.7  PHOS 2.6 2.8 2.2* 3.7 3.8   Liver Function Tests:  Recent Labs Lab 01/11/13 0610 01/12/13 0340 01/13/13 0355 01/14/13 0610 01/15/13 0532  ALBUMIN 3.6 3.5 3.4* 3.4* 3.5   No  results found for this basename: LIPASE, AMYLASE,  in the last 168 hours No results found for this basename: AMMONIA,  in the last 168 hours CBC:  Recent Labs Lab 01/11/13 0610 01/14/13 0610  WBC 10.0 8.2  HGB 11.7* 10.7*  HCT 35.9* 33.7*  MCV 89.1 90.1  PLT 237 249   Cardiac Enzymes: No results found for this basename: CKTOTAL, CKMB, CKMBINDEX, TROPONINI,  in the last 168 hours BNP (last 3 results)  Recent Labs  01/04/13 2315  PROBNP 16572.0*   CBG:  Recent Labs Lab 01/15/13 0707 01/15/13 1052 01/15/13 1722 01/15/13 2047 01/16/13 0625  GLUCAP 164* 195* 173* 209* 162*    No results found for this or any previous visit (from the past 240 hour(s)).   Studies: No results found.  Scheduled Meds: . allopurinol  100 mg Oral Daily  . amLODipine  10 mg Oral Daily  . aspirin EC  81 mg  Oral Daily  . calcium acetate  667 mg Oral TID WC  . carvedilol  6.25 mg Oral BID WC  . docusate sodium  100 mg Oral BID  . enoxaparin (LOVENOX) injection  30 mg Subcutaneous Daily  . ferric gluconate (FERRLECIT/NULECIT) IV  125 mg Intravenous Q M,W,F-HD  . furosemide  40 mg Oral BID  . gabapentin  300 mg Oral QHS  . hydrALAZINE  25 mg Oral Q8H  . insulin aspart  0-5 Units Subcutaneous QHS  . insulin aspart  0-9 Units Subcutaneous TID WC  . insulin glargine  5 Units Subcutaneous Daily  . isosorbide mononitrate  60 mg Oral Daily  . levothyroxine  75 mcg Oral QAC breakfast  . multivitamin  1 tablet Oral QHS  . polyethylene glycol  17 g Oral BID  . simvastatin  20 mg Oral q1800  . sodium chloride  3 mL Intravenous Q12H  . Travoprost (BAK Free)  1 drop Both Eyes QHS   Continuous Infusions: . albuterol Stopped (01/05/13 0044)     Marinda Elk  [facility] Pager [phone]. If 8PM-8AM, please contact night-coverage at [email or web address], password [facility] 01/16/2013, 9:49 AM  LOS: 12 days

## 2013-01-16 NOTE — Progress Notes (Signed)
Co-signed for LaTisha Teasley RN/BSN for assessments, IV assessments, medication administration, care plans, patient education, progress notes, and vital signs. Mindy Gali M, RN/BSN 

## 2013-01-16 NOTE — Progress Notes (Signed)
The patient did not have any acute changes overnight and did not have any complaints.

## 2013-01-16 NOTE — Progress Notes (Signed)
D/C IV; D/C to SNF, D/C instructions and medications discussed with pt. And family, pt. Showed no signs of acute distress or discomfort.

## 2013-01-21 ENCOUNTER — Encounter: Payer: Self-pay | Admitting: Vascular Surgery

## 2013-01-22 ENCOUNTER — Non-Acute Institutional Stay (SKILLED_NURSING_FACILITY): Payer: Medicare Other | Admitting: Internal Medicine

## 2013-01-22 ENCOUNTER — Encounter: Payer: Self-pay | Admitting: Internal Medicine

## 2013-01-22 ENCOUNTER — Encounter: Payer: Medicare Other | Admitting: Vascular Surgery

## 2013-01-22 DIAGNOSIS — E1151 Type 2 diabetes mellitus with diabetic peripheral angiopathy without gangrene: Secondary | ICD-10-CM

## 2013-01-22 DIAGNOSIS — I5033 Acute on chronic diastolic (congestive) heart failure: Secondary | ICD-10-CM

## 2013-01-22 DIAGNOSIS — E039 Hypothyroidism, unspecified: Secondary | ICD-10-CM

## 2013-01-22 DIAGNOSIS — E1159 Type 2 diabetes mellitus with other circulatory complications: Secondary | ICD-10-CM

## 2013-01-22 DIAGNOSIS — I739 Peripheral vascular disease, unspecified: Secondary | ICD-10-CM

## 2013-01-22 DIAGNOSIS — D649 Anemia, unspecified: Secondary | ICD-10-CM

## 2013-01-22 DIAGNOSIS — N184 Chronic kidney disease, stage 4 (severe): Secondary | ICD-10-CM

## 2013-01-22 DIAGNOSIS — E785 Hyperlipidemia, unspecified: Secondary | ICD-10-CM

## 2013-01-22 DIAGNOSIS — I509 Heart failure, unspecified: Secondary | ICD-10-CM

## 2013-01-22 NOTE — Assessment & Plan Note (Signed)
Hb around 10 - seems to be holding

## 2013-01-22 NOTE — Assessment & Plan Note (Addendum)
Continue pravachol , continued surveilance

## 2013-01-22 NOTE — Assessment & Plan Note (Signed)
Pt was HD to diurese acute on chronic heart failure that put a strain on already failing kidneys;last BUN/Cr 10/28  46/3.05;weekly BMP faxed to [facility]

## 2013-01-22 NOTE — Assessment & Plan Note (Addendum)
Insulin glargine only

## 2013-01-22 NOTE — Assessment & Plan Note (Signed)
Continue replacement 

## 2013-01-22 NOTE — Progress Notes (Signed)
MRN: [id number] Name: [name]  Sex: female Age: 79 y.o. DOB: October 26, 1923  PSC #: Ronni Rumble Facility/Room: Level Of Care: SNF Provider: Merrilee Seashore D Emergency Contacts: Extended Emergency Contact Information Primary Emergency Contact: Hill,Albert Address: [address]          [place], [place] [address] [place] Home Phone: [phone] Relation: Son Secondary Emergency Contact: Hill,Beverly  [place] Home Phone: [phone] Relation: Daughter  Code Status: FULL  Allergies: Aspirin  Chief Complaint  Patient presents with  . nursing home admission    HPI: Patient is 79 y.o. female who was recently HD for acute on chronic CHF-here for support and OT/PT.  Past Medical History  Diagnosis Date  . Coronary artery disease   . TIA (transient ischemic attack)   . Hypertension   . Shingles   . Stroke   . Anemia   . CAD (coronary artery disease) 02/08/2011  . Cardiomyopathy, idiopathic 02/08/2011  . Chronic kidney disease   . Angina     Takes Isosorbide  . Unstable angina 02/08/2011  . Myocardial infarct     x 3 unsure of years  . Irregular heartbeat   . Ulcer   . GERD (gastroesophageal reflux disease)     takes Protonix and zantac  . Hx of transient ischemic attack (TIA)   . Memory loss   . CHF (congestive heart failure)     Takes Lasix  . Gout     takes allopurinol  . Peripheral vascular disease     left lower  leg  . Diabetes mellitus     type 2 NIDDM x 2 years; no meds  . Chronic kidney disease (CKD), stage IV (severe)   . Arthritis   . Shortness of breath   . Thyroid disease   . Hypothyroidism     (SEVERE) Takes Levothryroxine  . Hypothyroid 02/08/2011  . Nonischemic cardiomyopathy     EF now is 55%, reduced due to myxedema, which is improved.  . Dyslipidemia   . Peripheral neuropathy   . H/O echocardiogram 09/06/11    Indication- nonIschemic Cardiomyopathy. EF = now greater than 55% with no regional wall motion  abnormalities. Tthere is mild to moderate trisuspid regurgitayion and mild pulmonary hypertension with an RVSP of 35 mmHg as well as stage 1 diastolic dysfunction and mild to moderate LVH.  Marland Kitchen Abnormal nuclear stress test 06/01/09    Demonstrated a new area of infarct scar, peri-infarct ischemia seen in the inferolateral territory. EF eas normal at 70% with mild hypocontractility at the apex, distal inferolateral wall.    Past Surgical History  Procedure Laterality Date  . Back surgery      [facility]  . Eye surgery      Left eye surgery; cataract removal  . Left bka  07/05/2011  . Av fistula placement    . Amputation  07/05/2011    Procedure: AMPUTATION BELOW KNEE;  Surgeon: Chuck Hint, MD;  Location: [facility] OR;  Service: Vascular;  Laterality: Left;  . Angioplasty  1988  . Cardiac catheterization  09/28/07    Demonstrated multiple sequential lesions around 40 to 30% in the RCA territory.  . Left lower extremity venous duplex Left 06/27/11    Summary: No evidence of DVT involving the left lower extremity and right common femoral vein.   . Lower extremity arterial evaluation  06/27/11    SUMMARY: Right: ABI not ascertained due to false elevation in BP secondary to calcification (posterior  tibial artery is non compressible). Left: ABI indicates moderate reduction in arterial flow. Bilateral: Great toe PPG waveforms indicate adequate perfusion. Great toe pressures not obtained due to patient's movements secondary to pain.  . Duplex doppler  05/10/11    LE arterial dopplers demonstrate bilaterally reduced ABIs of 0.91 on right & 0.56 on left. She does report some decreased pain on the left, & there's moderate mixed-density plaque in the right SFA w/50 to 69% reduction. There's a 69% reduction in the left SFA & does appear to be occlusive disease of left posterior tibial artery. Right posterior dorsalis pedis artery demonstrates occlusive disease  . Insertion of dialysis catheter N/A  01/06/2013    Procedure: INSERTION OF DIALYSIS CATHETER;  Surgeon: Larina Earthly, MD;  Location: [facility] OR;  Service: Vascular;  Laterality: N/A;      Medication List       This list is accurate as of: 01/22/13  8:21 PM.  Always use your most recent med list.               acetaminophen 325 MG tablet  Commonly known as:  TYLENOL  Take 2 tablets (650 mg total) by mouth every 4 (four) hours as needed.     allopurinol 100 MG tablet  Commonly known as:  ZYLOPRIM  Take 100 mg by mouth daily.     amLODipine 10 MG tablet  Commonly known as:  NORVASC  Take 10 mg by mouth daily.     aspirin 81 MG tablet  Take 81 mg by mouth daily.     calcium acetate 667 MG capsule  Commonly known as:  PHOSLO  Take 1 capsule (667 mg total) by mouth 3 (three) times daily with meals.     carvedilol 12.5 MG tablet  Commonly known as:  COREG  Take 0.5 tablets (6.25 mg total) by mouth 2 (two) times daily with a meal.     clopidogrel 75 MG tablet  Commonly known as:  PLAVIX  Take 75 mg by mouth daily.     DSS 100 MG Caps  Take 100 mg by mouth 2 (two) times daily.     fluconazole 100 MG tablet  Commonly known as:  DIFLUCAN  Take 100 mg by mouth daily. For 8 days     furosemide 40 MG tablet  Commonly known as:  LASIX  Take 1 tablet (40 mg total) by mouth 2 (two) times daily.     gabapentin 300 MG capsule  Commonly known as:  NEURONTIN  Take 1 capsule (300 mg total) by mouth at bedtime.     hydrALAZINE 25 MG tablet  Commonly known as:  APRESOLINE  Take 1 tablet (25 mg total) by mouth every 8 (eight) hours.     HYDROcodone-acetaminophen 5-325 MG per tablet  Commonly known as:  NORCO/VICODIN  Take 1 tablet by mouth every 6 (six) hours as needed.     insulin glargine 100 UNIT/ML injection  Commonly known as:  LANTUS  Inject 0.05 mLs (5 Units total) into the skin daily.     isosorbide mononitrate 60 MG 24 hr tablet  Commonly known as:  IMDUR  Take 60 mg by mouth daily.     levothyroxine 75  MCG tablet  Commonly known as:  SYNTHROID, LEVOTHROID  Take 75 mcg by mouth daily before breakfast.     multivitamin Tabs tablet  Take 1 tablet by mouth at bedtime.     nitroGLYCERIN 0.4 MG SL tablet  Commonly known as:  NITROSTAT  Place 0.4 mg under the tongue every 5 (five) minutes as needed for chest pain.     polyethylene glycol packet  Commonly known as:  MIRALAX / GLYCOLAX  Take 17 g by mouth daily.     pravastatin 40 MG tablet  Commonly known as:  PRAVACHOL  Take 40 mg by mouth daily.     travoprost (benzalkonium) 0.004 % ophthalmic solution  Commonly known as:  TRAVATAN  Place into both eyes at bedtime.        Meds ordered this encounter  Medications  . fluconazole (DIFLUCAN) 100 MG tablet    Sig: Take 100 mg by mouth daily. For 8 days    Immunization History  Administered Date(s) Administered  . Influenza Split 02/08/2012    History  Substance Use Topics  . Smoking status: Former Smoker -- 0.50 packs/day for 40 years    Quit date: 07/05/1986  . Smokeless tobacco: Never Used     Comment: quit smoking 1988  . Alcohol Use: No    Family history is noncontributory    Review of Systems  DATA OBTAINED: from patient,PT HAS NO C/O GENERAL: Feels well no fevers, fatigue, appetite changes SKIN: No itching, rash or wounds EYES: No eye pain, redness, discharge EARS: No earache, tinnitus, change in hearing NOSE: No congestion, drainage or bleeding  MOUTH/THROAT: No mouth or tooth pain, No sore throat, No difficulty chewing or swallowing  RESPIRATORY: No cough, wheezing, SOB CARDIAC: No chest pain, palpitations, lower extremity edema  GI: No abdominal pain, No N/V/D or constipation, No heartburn or reflux  GU: No dysuria, frequency or urgency, or incontinence  MUSCULOSKELETAL: No unrelieved bone/joint pain NEUROLOGIC: No headache, dizziness or focal weakness PSYCHIATRIC: No overt anxiety or sadness. Sleeps well. No behavior issue.   Filed Vitals:   01/22/13  1705  BP: 126/72  Pulse: 80  Temp: 98.6 F (37 C)  Resp: 18    Physical Exam  GENERAL APPEARANCE: Alert, conversant. Appropriately groomed. No acute distress.  SKIN: No diaphoresis rash, HEAD: Normocephalic, atraumatic  EYES: Conjunctiva/lids clear. Pupils round, reactive. EOMs intact.  EARS: External exam WNL, canals clear. Hearing grossly normal.  NOSE: No deformity or discharge.  MOUTH/THROAT: Lips w/o lesions.   RESPIRATORY: Breathing is even, unlabored. Lung sounds are clear   CARDIOVASCULAR: Heart RRR no murmurs, rubs or gallops. No peripheral edema.   GASTROINTESTINAL: Abdomen is soft, non-tender, not distended w/ normal bowel sounds. PROTUBERANT, NO FLUID WAVE GENITOURINARY: Bladder non tender, not distended  MUSCULOSKELETAL: L BKA NEUROLOGIC: Oriented X3. Cranial nerves 2-12 grossly intact. Moves all extremities no tremor. PSYCHIATRIC: Mood and affect appropriate to situation, no behavioral issues  Patient Active Problem List   Diagnosis Date Noted  . Acute on chronic diastolic CHF , improved with HD & UF 01/05/2013  . Anemia 01/05/2013  . Acute on chronic renal failure 01/04/13 01/05/2013  . Diastolic dysfunction 01/05/2013  . Dyslipidemia 01/05/2013  . Chronic renal insufficiency, stage IV (severe)- HD started 01/05/13 01/05/2013  . Bradycardia on admission 01/04/13, resolved with HD and decreased K+ 01/05/2013  . Diabetes mellitus type 2 with peripheral artery disease 01/05/2013  . Peripheral vascular disease, hx Lt BKA, known RLE disease 10/19/2011  . CAD - moderate at cath July 2012 (medical Rx) 02/08/2011  . 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 Hypothyroid 02/08/2011    CBC    Component Value Date/Time   WBC 8.2 01/14/2013 0610   RBC 3.74* 01/14/2013 0610   HGB  10.7* 01/14/2013 0610   HCT 33.7* 01/14/2013 0610   PLT 249 01/14/2013 0610   MCV 90.1 01/14/2013 0610   LYMPHSABS 1.8 01/04/2013 2315   MONOABS 0.7 01/04/2013 2315    EOSABS 0.5 01/04/2013 2315   BASOSABS 0.0 01/04/2013 2315    CMP     Component Value Date/Time   NA 131* 01/15/2013 0532   K 4.5 01/15/2013 0532   CL 90* 01/15/2013 0532   CO2 30 01/15/2013 0532   GLUCOSE 150* 01/15/2013 0532   BUN 46* 01/15/2013 0532   CREATININE 3.05* 01/15/2013 0532   CREATININE 2.70* 01/01/2013 0951   CALCIUM 9.7 01/15/2013 0532   CALCIUM 9.6 10/11/2010 1605   PROT 6.4 01/04/2013 2315   ALBUMIN 3.5 01/15/2013 0532   AST 25 01/04/2013 2315   ALT 18 01/04/2013 2315   ALKPHOS 99 01/04/2013 2315   BILITOT 0.2* 01/04/2013 2315   GFRNONAA 13* 01/15/2013 0532   GFRAA 15* 01/15/2013 0532    Assessment and Plan  Acute on chronic diastolic CHF , improved with HD & UF Required HD with IJ catheter after intensive diuresis did not work and worsened renal failure; pt d/c multiple meds to maintain the status quo  Chronic renal insufficiency, stage IV (severe)- HD started 01/05/13 Pt was HD to diurese acute on chronic heart failure that put a strain on already failing kidneys;last BUN/Cr 10/28  46/3.05;weekly BMP faxed to [facility]  Diabetes mellitus type 2 with peripheral artery disease Insulin glargine only   Hypothyroid Continue replacement  Anemia Hb around 10 - seems to be holding   Dyslipidemia continue pravachol  Peripheral vascular disease, hx Lt BKA, known RLE disease Continue pravachol , continued surveilance    Margit Hanks, MD

## 2013-01-22 NOTE — Assessment & Plan Note (Signed)
-  continue pravachol 

## 2013-01-22 NOTE — Assessment & Plan Note (Signed)
Required HD with IJ catheter after intensive diuresis did not work and worsened renal failure; pt d/c multiple meds to maintain the status quo

## 2013-01-30 ENCOUNTER — Ambulatory Visit (INDEPENDENT_AMBULATORY_CARE_PROVIDER_SITE_OTHER): Payer: Medicare Other | Admitting: Internal Medicine

## 2013-01-30 ENCOUNTER — Telehealth: Payer: Self-pay | Admitting: Internal Medicine

## 2013-01-30 ENCOUNTER — Encounter: Payer: Self-pay | Admitting: Internal Medicine

## 2013-01-30 VITALS — BP 128/70 | HR 86 | Ht 59.0 in | Wt 128.0 lb

## 2013-01-30 DIAGNOSIS — N184 Chronic kidney disease, stage 4 (severe): Secondary | ICD-10-CM

## 2013-01-30 DIAGNOSIS — I498 Other specified cardiac arrhythmias: Secondary | ICD-10-CM

## 2013-01-30 DIAGNOSIS — I43 Cardiomyopathy in diseases classified elsewhere: Secondary | ICD-10-CM

## 2013-01-30 DIAGNOSIS — E1151 Type 2 diabetes mellitus with diabetic peripheral angiopathy without gangrene: Secondary | ICD-10-CM

## 2013-01-30 DIAGNOSIS — Z79899 Other long term (current) drug therapy: Secondary | ICD-10-CM

## 2013-01-30 DIAGNOSIS — E785 Hyperlipidemia, unspecified: Secondary | ICD-10-CM

## 2013-01-30 DIAGNOSIS — E039 Hypothyroidism, unspecified: Secondary | ICD-10-CM

## 2013-01-30 DIAGNOSIS — R001 Bradycardia, unspecified: Secondary | ICD-10-CM

## 2013-01-30 DIAGNOSIS — I739 Peripheral vascular disease, unspecified: Secondary | ICD-10-CM

## 2013-01-30 DIAGNOSIS — E1159 Type 2 diabetes mellitus with other circulatory complications: Secondary | ICD-10-CM

## 2013-01-30 DIAGNOSIS — R5381 Other malaise: Secondary | ICD-10-CM

## 2013-01-30 DIAGNOSIS — I2510 Atherosclerotic heart disease of native coronary artery without angina pectoris: Secondary | ICD-10-CM

## 2013-01-30 LAB — COMPREHENSIVE METABOLIC PANEL
AST: 27 U/L (ref 0–37)
Albumin: 4.7 g/dL (ref 3.5–5.2)
Alkaline Phosphatase: 93 U/L (ref 39–117)
BUN: 49 mg/dL — ABNORMAL HIGH (ref 6–23)
Creat: 2.53 mg/dL — ABNORMAL HIGH (ref 0.50–1.10)
Glucose, Bld: 273 mg/dL — ABNORMAL HIGH (ref 70–99)
Total Bilirubin: 0.3 mg/dL (ref 0.3–1.2)

## 2013-01-30 NOTE — Telephone Encounter (Signed)
Spoke with Pam at [employer].  She made appt for patient on November 25 but will talk to Dr. Darrick Penna next week when he is back in the office and try to get her in sooner.

## 2013-01-30 NOTE — Patient Instructions (Signed)
You need to schedule a STAT (urgent) appointment with Dr. Darrick Penna to assess your dialysis catheter & labs.  Your physician wants you to follow-up in: 6 months. You will receive a reminder letter in the mail two months in advance. If you don't receive a letter, please call our office to schedule the follow-up appointment.

## 2013-01-30 NOTE — Progress Notes (Signed)
OFFICE NOTE  Chief Complaint:  Routine followup  Primary Care Physician: Georgianne Fick, MD  HPI:  [name] is a pleasant 79 year old female with an unfortunate history of heart failure in the past due to severe hypothyroidism. EF has subsequently normalized. However, she does have significant peripheral arterial disease; underwent a left BKA. When I saw her last on December 13, she was having problems with rest pain in her right foot, and discoloration of the sole of her foot and toes, with tenderness to palpation. I had prescribed her for pain medicine and had reviewed her lower extremity arteriogram with Dr. Allyson Sabal, and felt that there were very few options for revascularization. She returned today and has reported some improvement with reduction in pain, and there does appear to be less discoloration. However, pulses still remain very weak. Her family reports she's also had a mild cough and does seem a little bit more fatigued. She did not report any worsening shortness of breath or orthopnea but has had some difficulty transferring. At her last office visit, her JVP is elevated and there was some trace right lower extremity edema. She did have basilar crackles and appeared to be volume overloaded. I recommended increasing her diuretics and ordered a BMP and BNP. She was to return to the office in 2 weeks for reassessment. Unfortunately she never made that appointment - ultimately I recheck some laboratory work and she was noted to have progressive renal failure. She was ultimately brought to the ED with complaints of worsening SOB and Wheezing. EMS was called to her home and found her O2 sats to be 70% and she was placed on supplemental O2, and had mild improvement. She was found to have findings of CHF, however, her EF on echo was 65-70%. She has acute on chronic renal failure - her fistula could not be accessed and a temporary dialysis catheter was placed. She was dialyzed while in  the hospital and was discharged to rehabilitation. She has not had any followup dialysis and is supposed to be getting weekly test of her renal function being sent to her nephrologist. I do not see where she has an appointment with her nephrologist.  PMHx:  Past Medical History  Diagnosis Date  . Coronary artery disease   . TIA (transient ischemic attack)   . Hypertension   . Shingles   . Stroke   . Anemia   . CAD (coronary artery disease) 02/08/2011  . Cardiomyopathy, idiopathic 02/08/2011  . Chronic kidney disease   . Angina     Takes Isosorbide  . Unstable angina 02/08/2011  . Myocardial infarct     x 3 unsure of years  . Irregular heartbeat   . Ulcer   . GERD (gastroesophageal reflux disease)     takes Protonix and zantac  . Hx of transient ischemic attack (TIA)   . Memory loss   . CHF (congestive heart failure)     Takes Lasix  . Gout     takes allopurinol  . Peripheral vascular disease     left lower  leg  . Diabetes mellitus     type 2 NIDDM x 2 years; no meds  . Chronic kidney disease (CKD), stage IV (severe)   . Arthritis   . Shortness of breath   . Thyroid disease   . Hypothyroidism     (SEVERE) Takes Levothryroxine  . Hypothyroid 02/08/2011  . Nonischemic cardiomyopathy     EF now is 55%, reduced due to myxedema,  which is improved.  . Dyslipidemia   . Peripheral neuropathy   . H/O echocardiogram 09/06/11    Indication- nonIschemic Cardiomyopathy. EF = now greater than 55% with no regional wall motion abnormalities. Tthere is mild to moderate trisuspid regurgitayion and mild pulmonary hypertension with an RVSP of 35 mmHg as well as stage 1 diastolic dysfunction and mild to moderate LVH.  Marland Kitchen Abnormal nuclear stress test 06/01/09    Demonstrated a new area of infarct scar, peri-infarct ischemia seen in the inferolateral territory. EF eas normal at 70% with mild hypocontractility at the apex, distal inferolateral wall.    Past Surgical History  Procedure  Laterality Date  . Back surgery      [facility]  . Eye surgery      Left eye surgery; cataract removal  . Left bka  07/05/2011  . Av fistula placement    . Amputation  07/05/2011    Procedure: AMPUTATION BELOW KNEE;  Surgeon: Chuck Hint, MD;  Location: [facility] OR;  Service: Vascular;  Laterality: Left;  . Angioplasty  1988  . Cardiac catheterization  09/28/07    Demonstrated multiple sequential lesions around 40 to 30% in the RCA territory.  . Left lower extremity venous duplex Left 06/27/11    Summary: No evidence of DVT involving the left lower extremity and right common femoral vein.   . Lower extremity arterial evaluation  06/27/11    SUMMARY: Right: ABI not ascertained due to false elevation in BP secondary to calcification (posterior tibial artery is non compressible). Left: ABI indicates moderate reduction in arterial flow. Bilateral: Great toe PPG waveforms indicate adequate perfusion. Great toe pressures not obtained due to patient's movements secondary to pain.  . Duplex doppler  05/10/11    LE arterial dopplers demonstrate bilaterally reduced ABIs of 0.91 on right & 0.56 on left. She does report some decreased pain on the left, & there's moderate mixed-density plaque in the right SFA w/50 to 69% reduction. There's a 69% reduction in the left SFA & does appear to be occlusive disease of left posterior tibial artery. Right posterior dorsalis pedis artery demonstrates occlusive disease  . Insertion of dialysis catheter N/A 01/06/2013    Procedure: INSERTION OF DIALYSIS CATHETER;  Surgeon: Larina Earthly, MD;  Location: [facility] OR;  Service: Vascular;  Laterality: N/A;    FAMHx:  Family History  Problem Relation Age of Onset  . Cancer Sister     STOMACH  . Diabetes Sister   . Cancer Brother     BONE  . Diabetes Brother   . Anesthesia problems Neg Hx   . Hypotension Neg Hx   . Malignant hyperthermia Neg Hx   . Pseudochol deficiency Neg Hx   . Hyperlipidemia Daughter   .  Hypertension Daughter   . Heart disease Daughter     before age 59  . Kidney disease Daughter   . Heart attack Daughter   . Other Daughter     varicose veins  . Diabetes Daughter   . Heart disease Son     before age 47  . Hyperlipidemia Son   . Hypertension Son   . Heart attack Son     SOCHx:   reports that she quit smoking about 26 years ago. She has never used smokeless tobacco. She reports that she does not drink alcohol or use illicit drugs.  ALLERGIES:  Allergies  Allergen Reactions  . Aspirin Nausea And Vomiting    325 mg (adult strength) Patient stated  that she can take the coated aspirin with no problems.     ROS: A comprehensive review of systems was negative except for: Constitutional: positive for fatigue  HOME MEDS: Current Outpatient Prescriptions  Medication Sig Dispense Refill  . acetaminophen (TYLENOL) 325 MG tablet Take 2 tablets (650 mg total) by mouth every 4 (four) hours as needed.      Marland Kitchen allopurinol (ZYLOPRIM) 100 MG tablet Take 100 mg by mouth daily.       Marland Kitchen amLODipine (NORVASC) 10 MG tablet Take 10 mg by mouth daily.      Marland Kitchen aspirin 81 MG tablet Take 81 mg by mouth daily.      . calcitRIOL (ROCALTROL) 0.25 MCG capsule Take 1 capsule by mouth daily.      . calcium acetate (PHOSLO) 667 MG capsule Take 1 capsule (667 mg total) by mouth 3 (three) times daily with meals.      . carvedilol (COREG) 12.5 MG tablet Take 0.5 tablets (6.25 mg total) by mouth 2 (two) times daily with a meal.  30 tablet  0  . clopidogrel (PLAVIX) 75 MG tablet Take 75 mg by mouth daily.      Marland Kitchen docusate sodium 100 MG CAPS Take 100 mg by mouth 2 (two) times daily.  10 capsule  0  . fluconazole (DIFLUCAN) 100 MG tablet Take 100 mg by mouth daily. For 8 days      . furosemide (LASIX) 40 MG tablet Take 1 tablet (40 mg total) by mouth 2 (two) times daily.  30 tablet    . gabapentin (NEURONTIN) 300 MG capsule Take 1 capsule (300 mg total) by mouth at bedtime.      . hydrALAZINE  (APRESOLINE) 25 MG tablet Take 1 tablet (25 mg total) by mouth every 8 (eight) hours.      Marland Kitchen HYDROcodone-acetaminophen (NORCO/VICODIN) 5-325 MG per tablet Take 1 tablet by mouth every 6 (six) hours as needed.  30 tablet  0  . insulin glargine (LANTUS) 100 UNIT/ML injection Inject 0.05 mLs (5 Units total) into the skin daily.  10 mL  12  . isosorbide mononitrate (IMDUR) 60 MG 24 hr tablet Take 60 mg by mouth daily.       Marland Kitchen levothyroxine (SYNTHROID, LEVOTHROID) 75 MCG tablet Take 75 mcg by mouth daily before breakfast.      . multivitamin (RENA-VIT) TABS tablet Take 1 tablet by mouth at bedtime.    0  . nitroGLYCERIN (NITROSTAT) 0.4 MG SL tablet Place 0.4 mg under the tongue every 5 (five) minutes as needed for chest pain.       . ONGLYZA 5 MG TABS tablet Take 1 tablet by mouth daily.      . polyethylene glycol (MIRALAX / GLYCOLAX) packet Take 17 g by mouth daily.  14 each  0  . pravastatin (PRAVACHOL) 40 MG tablet Take 40 mg by mouth daily.      . travoprost, benzalkonium, (TRAVATAN) 0.004 % ophthalmic solution Place into both eyes at bedtime.       No current facility-administered medications for this visit.    LABS/IMAGING: No results found for this or any previous visit (from the past 48 hour(s)). No results found.  VITALS: BP 128/70  Pulse 86  Ht 4\' 11"  (1.499 m)  Wt 128 lb (58.06 kg)  BMI 25.84 kg/m2  EXAM: General appearance: alert and no distress Neck: JVD flat, no carotid bruit Lungs: diminished breath sounds bilaterally  Heart: regular rate and rhythm Abdomen: soft, mildly protuberant Extremities:  no edema on the right leg, left is BKA Pulses: faint pulse in the right foot Skin: Skin color, texture, turgor normal. No rashes or lesions Neurologic: Mental status: Alert, oriented, thought content appropriate  EKG: Normal sinus rhythm at 86, lateral T wave inversions - unchanged  ASSESSMENT: 1. Acute on chronic systolic heart failure exacerbation - improved 2. Acute on  chronic kidney disease stage V-hemodialysis catheter in place 3. PAD with left BKA 4. History of severe hypothyroidism with cardiomyopathy, EF improved to 65=70%  PLAN: 1.   Mrs. Sandstrom appears stable post discharge. Her weight has remained stable now at 128 lbs, which was her discharge weight. She denies any worsening shortness of breath and is participating in rehabilitation. She says that she does not have any followup appointment with her kidney doctor which is surprising. We will go head and try to schedule her appointment as soon as possible as she most likely will need to be reevaluated for either hemodialysis or removal of her dialysis catheter if it looks like she did not need that. Just has an appointment with [name] in in December regarding her fistula.  Chrystie Nose, MD, [facility] Attending Cardiologist [facility]  Rodrickus Min C 01/30/2013, 11:18 AM

## 2013-02-01 ENCOUNTER — Telehealth: Payer: Self-pay | Admitting: *Deleted

## 2013-02-01 NOTE — Telephone Encounter (Signed)
Called patient with CMET & TSH results - patient verbalized understanding.

## 2013-02-20 ENCOUNTER — Non-Acute Institutional Stay (SKILLED_NURSING_FACILITY): Payer: Medicare Other | Admitting: Internal Medicine

## 2013-02-20 DIAGNOSIS — D649 Anemia, unspecified: Secondary | ICD-10-CM

## 2013-02-20 DIAGNOSIS — I509 Heart failure, unspecified: Secondary | ICD-10-CM

## 2013-02-20 DIAGNOSIS — E1151 Type 2 diabetes mellitus with diabetic peripheral angiopathy without gangrene: Secondary | ICD-10-CM

## 2013-02-20 DIAGNOSIS — I5033 Acute on chronic diastolic (congestive) heart failure: Secondary | ICD-10-CM

## 2013-02-20 DIAGNOSIS — I739 Peripheral vascular disease, unspecified: Secondary | ICD-10-CM

## 2013-02-20 DIAGNOSIS — E039 Hypothyroidism, unspecified: Secondary | ICD-10-CM

## 2013-02-20 DIAGNOSIS — E785 Hyperlipidemia, unspecified: Secondary | ICD-10-CM

## 2013-02-20 DIAGNOSIS — E1159 Type 2 diabetes mellitus with other circulatory complications: Secondary | ICD-10-CM

## 2013-02-20 DIAGNOSIS — N184 Chronic kidney disease, stage 4 (severe): Secondary | ICD-10-CM

## 2013-02-20 NOTE — Progress Notes (Signed)
Patient ID: [name], female   DOB: 1923-09-29, 79 y.o.   MRN: [id number]  Location:  [facility] [name] L. Renato Gails, D.O., C.M.D.  PCP: Georgianne Fick, MD  Code Status: full code  Allergies  Allergen Reactions  . Aspirin Nausea And Vomiting    325 mg (adult strength) [name]     Chief Complaint  Patient presents with  . Discharge Note    home with pt, ot, rn, reacher, f/u appt 02/27/13 with vein and vascular for dialysis catheter removal as left upper extremity access felt to be ready for use if needed    HPI:  79 yo female with h/o CAD, stroke, anemia of chronic disease, htn, hyperlipidemia, DMII, and CKDIV with plans for long term HD seen for discharge.  She has been here short term for rehab s/p hospitalization for acute on chronic diastolic chf requiring HD.    She has improved significantly while here and feels ready to go home after PT, OT.  Review of Systems:  Review of Systems  Constitutional: Negative for fever and chills.  HENT: Negative for congestion.   Respiratory: Negative for shortness of breath.   Cardiovascular: Negative for chest pain and leg swelling.  Gastrointestinal: Negative for abdominal pain.  Genitourinary: Negative for dysuria.  Musculoskeletal: Negative for myalgias.       Prior left bka  Skin: Negative for rash.  Neurological: Negative for dizziness.  Psychiatric/Behavioral: Negative for depression.     Past Medical History  Diagnosis Date  . Coronary artery disease   . TIA (transient ischemic attack)   . Hypertension   . Shingles   . Stroke   . Anemia   . CAD (coronary artery disease) 02/08/2011  . Cardiomyopathy, idiopathic 02/08/2011  . Chronic kidney disease   . Angina     Takes Isosorbide  . Unstable angina 02/08/2011  . Myocardial infarct     x 3 unsure of years  . Irregular heartbeat   . Ulcer   . GERD (gastroesophageal reflux disease)    takes Protonix and zantac  . Hx of transient ischemic attack (TIA)   . Memory loss   . CHF (congestive heart failure)     Takes Lasix  . Gout     takes allopurinol  . Peripheral vascular disease     left lower  leg  . Diabetes mellitus     type 2 NIDDM x 2 years; no meds  . Chronic kidney disease (CKD), stage IV (severe)   . Arthritis   . Shortness of breath   . Thyroid disease   . Hypothyroidism     (SEVERE) Takes Levothryroxine  . Hypothyroid 02/08/2011  . Nonischemic cardiomyopathy     EF now is 55%, reduced due to myxedema, which is improved.  . Dyslipidemia   . Peripheral neuropathy   . H/O echocardiogram 09/06/11    Indication- nonIschemic Cardiomyopathy. EF = now greater than 55% with no regional wall motion abnormalities. Tthere is mild to moderate trisuspid regurgitayion and mild pulmonary hypertension with an RVSP of 35 mmHg as well as stage 1 diastolic dysfunction and mild to moderate LVH.  Marland Kitchen Abnormal nuclear stress test 06/01/09    Demonstrated a new area of infarct scar, peri-infarct ischemia seen in the inferolateral territory. EF eas normal at 70% with mild hypocontractility at the apex, distal inferolateral wall.    Past Surgical History  Procedure Laterality Date  . Back surgery  [facility]  . Eye surgery      Left eye surgery; cataract removal  . Left bka  07/05/2011  . Av fistula placement    . Amputation  07/05/2011    Procedure: AMPUTATION BELOW KNEE;  Surgeon: Chuck Hint, MD;  [facility];  Service: Vascular;  Laterality: Left;  . Angioplasty  1988  . Cardiac catheterization  09/28/07    Demonstrated multiple sequential lesions around 40 to 30% in the RCA territory.  . Left lower extremity venous duplex Left 06/27/11    Summary: No evidence of DVT involving the left lower extremity and right common femoral vein.   . Lower extremity arterial evaluation  06/27/11    SUMMARY: Right: ABI not ascertained due to false elevation in BP  secondary to calcification (posterior tibial artery is non compressible). Left: ABI indicates moderate reduction in arterial flow. Bilateral: Great toe PPG waveforms indicate adequate perfusion. Great toe pressures not obtained due to patient's movements secondary to pain.  . Duplex doppler  05/10/11    LE arterial dopplers demonstrate bilaterally reduced ABIs of 0.91 on right & 0.56 on left. She does report some decreased pain on the left, & there's moderate mixed-density plaque in the right SFA w/50 to 69% reduction. There's a 69% reduction in the left SFA & does appear to be occlusive disease of left posterior tibial artery. Right posterior dorsalis pedis artery demonstrates occlusive disease  . Insertion of dialysis catheter N/A 01/06/2013    Procedure: INSERTION OF DIALYSIS CATHETER;  Surgeon: Larina Earthly, MD;  Location[facility] OR;  Service: Vascular;  Laterality: N/A;    Social History:   reports that she quit smoking about 26 years ago. She has never used smokeless tobacco. She reports that she does not drink alcohol or use illicit drugs.  Family History  Problem Relation Age of Onset  . Cancer Sister     STOMACH  . Diabetes Sister   . Cancer Brother     BONE  . Diabetes Brother   . Anesthesia problems Neg Hx   . Hypotension Neg Hx   . Malignant hyperthermia Neg Hx   . Pseudochol deficiency Neg Hx   . Hyperlipidemia Daughter   . Hypertension Daughter   . Heart disease Daughter     before age 73  . Kidney disease Daughter   . Heart attack Daughter   . Other Daughter     varicose veins  . Diabetes Daughter   . Heart disease Son     before age 32  . Hyperlipidemia Son   . Hypertension Son   . Heart attack Son     Medications: Patient's Medications  New Prescriptions   No medications on file  Previous Medications   ACETAMINOPHEN (TYLENOL) 325 MG TABLET    Take 2 tablets (650 mg total) by mouth every 4 (four) hours as needed.   ALLOPURINOL (ZYLOPRIM) 100 MG TABLET    Take  100 mg by mouth daily.    AMLODIPINE (NORVASC) 10 MG TABLET    Take 5 mg by mouth daily.    ASPIRIN 81 MG TABLET    Take 81 mg by mouth daily.   CALCITRIOL (ROCALTROL) 0.25 MCG CAPSULE    Take 1 capsule by mouth daily.   CALCIUM ACETATE (PHOSLO) 667 MG CAPSULE    Take 1 capsule (667 mg total) by mouth 3 (three) times daily with meals.   CARVEDILOL (COREG) 12.5 MG TABLET    Take 0.5 tablets (6.25  mg total) by mouth 2 (two) times daily with a meal.   CLOPIDOGREL (PLAVIX) 75 MG TABLET    Take 75 mg by mouth daily.   DOCUSATE SODIUM 100 MG CAPS    Take 100 mg by mouth 2 (two) times daily.   FLUCONAZOLE (DIFLUCAN) 100 MG TABLET    Take 100 mg by mouth daily. For 8 days   FUROSEMIDE (LASIX) 40 MG TABLET    Take 1 tablet (40 mg total) by mouth 2 (two) times daily.   GABAPENTIN (NEURONTIN) 300 MG CAPSULE    Take 1 capsule (300 mg total) by mouth at bedtime.   HYDRALAZINE (APRESOLINE) 25 MG TABLET    Take 1 tablet (25 mg total) by mouth every 8 (eight) hours.   HYDROCODONE-ACETAMINOPHEN (NORCO/VICODIN) 5-325 MG PER TABLET    Take 1 tablet by mouth every 6 (six) hours as needed.   INSULIN GLARGINE (LANTUS) 100 UNIT/ML INJECTION    Inject 0.05 mLs (5 Units total) into the skin daily.   ISOSORBIDE MONONITRATE (IMDUR) 60 MG 24 HR TABLET    Take 60 mg by mouth daily.    LEVOTHYROXINE (SYNTHROID, LEVOTHROID) 75 MCG TABLET    Take 75 mcg by mouth daily before breakfast.   MULTIVITAMIN (RENA-VIT) TABS TABLET    Take 1 tablet by mouth at bedtime.   NITROGLYCERIN (NITROSTAT) 0.4 MG SL TABLET    Place 0.4 mg under the tongue every 5 (five) minutes as needed for chest pain.    ONGLYZA 5 MG TABS TABLET    Take 1 tablet by mouth daily.   POLYETHYLENE GLYCOL (MIRALAX / GLYCOLAX) PACKET    Take 17 g by mouth daily.   PRAVASTATIN (PRAVACHOL) 40 MG TABLET    Take 40 mg by mouth daily.   TRAVOPROST, BENZALKONIUM, (TRAVATAN) 0.004 % OPHTHALMIC SOLUTION    Place into both eyes at bedtime.  Modified Medications   No  medications on file  Discontinued Medications   No medications on file    Physical Exam: Filed Vitals:   02/20/13 1746  BP: 136/66  Pulse: 63  Temp: 98.8 F (37.1 C)  Resp: 20  Weight: 130 lb (58.968 kg)  SpO2: 94%   Physical Exam  Constitutional: She is oriented to person, place, and time. No distress.  Cardiovascular: Normal rate, regular rhythm and normal heart sounds.   HD catheter in place  Pulmonary/Chest: Effort normal and breath sounds normal. No respiratory distress.  Abdominal: Soft. Bowel sounds are normal. She exhibits no distension and no mass. There is no tenderness.  Musculoskeletal: Normal range of motion.  Left bka  Neurological: She is alert and oriented to person, place, and time. No cranial nerve deficit.  Skin: Skin is warm and dry.  Psychiatric: She has a normal mood and affect.   Labs reviewed: Basic Metabolic Panel:  Recent Labs  09/81/19 0355 01/14/13 0610 01/15/13 0532 01/30/13 1052  NA 132* 133* 131* 137  K 4.0 4.6 4.5 5.0  CL 90* 90* 90* 99  CO2 31 30 30 19   GLUCOSE 152* 138* 150* 273*  BUN 41* 42* 46* 49*  CREATININE 2.79* 3.02* 3.05* 2.53*  CALCIUM 8.7 9.6 9.7 9.6  PHOS 2.2* 3.7 3.8  --    Liver Function Tests:  Recent Labs  01/04/13 2315  01/14/13 0610 01/15/13 0532 01/30/13 1052  AST 25  --   --   --  27  ALT 18  --   --   --  15  ALKPHOS 99  --   --   --  93  BILITOT 0.2*  --   --   --  0.3  PROT 6.4  --   --   --  7.6  ALBUMIN 3.4*  < > 3.4* 3.5 4.7  < > = values in this interval not displayed. CBC:  Recent Labs  01/04/13 2315  01/07/13 1048 01/11/13 0610 01/14/13 0610  WBC 8.8  < > 9.8 10.0 8.2  NEUTROABS 5.8  --   --   --   --   HGB 11.0*  < > 9.3* 11.7* 10.7*  HCT 33.4*  < > 28.0* 35.9* 33.7*  MCV 87.0  < > 85.9 89.1 90.1  PLT 350  < > 275 237 249  < > = values in this interval not displayed. Cardiac Enzymes:  Recent Labs  01/05/13 0130 01/05/13 0825 01/05/13 1330  TROPONINI <0.30 <0.30 <0.30    CBG:  Recent Labs  01/16/13 1052 01/16/13 1402 01/16/13 1601  GLUCAP 183* 226* 228*     Assessment/Plan:   1. Acute on chronic diastolic CHF (congestive heart failure), NYHA class 3 -symptoms improved after receiving HD and getting volume under control -cont lasix 40mg  po bid, has not needed Kcl with CKDIV  2. Chronic renal insufficiency, stage IV (severe) -is waiting for HD access to mature--currently was getting HD through catheter--keep f/u with vascular to reassess if ready for use and d/c catheter on 12/10 if ok -cont secondary hyperparathyroid txs per renal  3. Diabetes mellitus type 2 with peripheral artery disease -cont asa, statin, hydralazine, lantus  4. Hypothyroid -cont synthroid daily   5. Anemia -of chronic kidney disease  6. Dyslipidemia -cont pravachol  7. Peripheral vascular disease, unspecified -cont plavix, asa  Patient is being discharged with home health services:  PT, OT, RN;  Note still has HD catheter with f/u appt at vascular 02/27/13  Patient is being discharged with the following durable medical equipment:  reacher  Patient has been advised to f/u with their PCP in 1-2 weeks to bring them up to date on their rehab stay.  They were provided with a 30 day supply of scripts for prescription medications and refills must be obtained from their PCP.

## 2013-02-26 ENCOUNTER — Encounter: Payer: Self-pay | Admitting: Vascular Surgery

## 2013-02-27 ENCOUNTER — Ambulatory Visit (INDEPENDENT_AMBULATORY_CARE_PROVIDER_SITE_OTHER): Payer: Medicare Other | Admitting: Vascular Surgery

## 2013-02-27 ENCOUNTER — Encounter: Payer: Self-pay | Admitting: Vascular Surgery

## 2013-02-27 VITALS — BP 160/65 | HR 84 | Resp 18 | Ht <= 58 in | Wt 128.0 lb

## 2013-02-27 DIAGNOSIS — N184 Chronic kidney disease, stage 4 (severe): Secondary | ICD-10-CM

## 2013-02-27 DIAGNOSIS — I7092 Chronic total occlusion of artery of the extremities: Secondary | ICD-10-CM | POA: Insufficient documentation

## 2013-02-27 DIAGNOSIS — I739 Peripheral vascular disease, unspecified: Secondary | ICD-10-CM

## 2013-02-27 NOTE — Progress Notes (Signed)
Patient name: [name] MRN: [id number] DOB: [birth year] Sex: female  REASON FOR VISIT: follow up of peripheral vascular disease.  HPI: [name] is a 79 y.o. female who I last saw in March of this year. She has undergone previous left below the knee amputation. She was asymptomatic at that time and we've been following her right lower extremity. She comes in for a 9 month follow up visit. She is nonambulatory as she does not have a prosthesis for her left lower extremity. She denies any claudication symptoms in the right and has no history of rest pain or nonhealing ulcers on the right.  She tells me that she is not currently on dialysis.  He had a left basilic vein transposition placed on 05/27/2011. She had a tunneled dialysis catheter placed but she states that it was only used 3 times. She has a fistula in her left arm which does not appear to be functional and is pulsatile and sclerotic.  Her only complaint today has been some swelling in her right upper extremity. She has a dialysis catheter in her right IJ that was placed on 01/06/2013. She tells me that this catheter is not being used, and that she is not currently on dialysis.  REVIEW OF SYSTEMS: [name] ] denotes positive finding; [  ] denotes negative finding  CARDIOVASCULAR:  [ ]  chest pain   [ ]  dyspnea on exertion    CONSTITUTIONAL:  [ ]  fever   [ ]  chills  PHYSICAL EXAM: Filed Vitals:   02/27/13 1243  BP: 160/65  Pulse: 84  Resp: 18  Height: 4\' 9"  (1.448 m)  Weight: 128 lb (58.06 kg)   Body mass index is 27.69 kg/(m^2). GENERAL: The patient is a well-nourished female, in no acute distress. The vital signs are documented above. CARDIOVASCULAR: There is a regular rate and rhythm. PULMONARY: There is good air exchange bilaterally without wheezing or rales. Her left upper arm fistula is pulsatile and the vein appeared sclerotic. I do not think that this fistula will be usable.  I reviewed her previous Doppler studies  from March of this year. At that time she had monophasic Doppler signals in the right foot. ABIs could not be obtained because the vessels are noncompressible.  I attempted to contact Dr. Darrick [name] today concerning her plans for dialysis but he is on vacation this week. Records were faxed over from his office. She was last seen by Dr. Darrick [name] on 02/18/13. I have reviewed these records. She has stage V chronic kidney disease. He noted that she did have a functioning catheter in that the fistula was patent. Plan on seeing her back in 2 weeks and will check her blood work at that time. He felt that she may potentially require dialysis before that.  MEDICAL ISSUES:  Chronic renal insufficiency, stage IV (severe)- HD started 01/05/13 This patient has progressive renal insufficiency. Based on my exam I'm not sure that the left upper arm fistula will be adequate for dialysis.  The vein feels sclerotic. She does have right arm swelling however am reluctant to remove her right IJ catheter because she may need for dialysis in the near future. She is to follow up with Dr. Darrick [name] in the near future. If he feels that we need to proceed with placement of new access, we would likely need to place a graft in the left arm given that she has no further options for a fistula on the left and given the swelling she's  having in her right arm.  Peripheral vascular disease, hx Lt BKA, known RLE disease She has stable infrainguinal arterial occlusive disease on the right.   [name] S [facility] Beeper: [phone]

## 2013-02-27 NOTE — Assessment & Plan Note (Signed)
This patient has progressive renal insufficiency. Based on my exam I'm not sure that the left upper arm fistula will be adequate for dialysis.  The vein feels sclerotic. She does have right arm swelling however am reluctant to remove her right IJ catheter because she may need for dialysis in the near future. She is to follow up with Dr. Darrick Penna in the near future. If he feels that we need to proceed with placement of new access, we would likely need to place a graft in the left arm given that she has no further options for a fistula on the left and given the swelling she's having in her right arm.

## 2013-02-27 NOTE — Assessment & Plan Note (Signed)
She has stable infrainguinal arterial occlusive disease on the right.

## 2013-02-28 ENCOUNTER — Encounter: Payer: Self-pay | Admitting: Nephrology

## 2013-03-07 ENCOUNTER — Other Ambulatory Visit: Payer: Self-pay | Admitting: *Deleted

## 2013-03-07 ENCOUNTER — Telehealth: Payer: Self-pay | Admitting: *Deleted

## 2013-03-07 NOTE — Telephone Encounter (Signed)
Malachi Bonds stated that Dr Deterding requested that Carlyne's catheter be removed and evaluated for access when he saw her 02/18/13. I informed her Dr Edilia Bo saw Jenel Lucks 02/27/13 and did not have this message at the time; so deferred taking out cath in case dialysis needed soon but recommended a Left arm AVGG (as Dr Darrick Penna had in October). Geanie is scheduled for HD catheter removal on 03/11/13 at 1:00pm at [facility]. I spoke with Juliane Lack (Gloria's son) to confirm. He declined at this time to schedule the graft insertion and said he would call back.

## 2013-03-08 ENCOUNTER — Other Ambulatory Visit (HOSPITAL_COMMUNITY): Payer: Self-pay | Admitting: *Deleted

## 2013-03-11 ENCOUNTER — Ambulatory Visit (HOSPITAL_COMMUNITY)
Admission: RE | Admit: 2013-03-11 | Discharge: 2013-03-11 | Disposition: A | Discharge status: Home / Self Care | Payer: Medicare Other | Source: Ambulatory Visit | Attending: Vascular Surgery | Admitting: Vascular Surgery

## 2013-03-11 DIAGNOSIS — Z452 Encounter for adjustment and management of vascular access device: Secondary | ICD-10-CM | POA: Insufficient documentation

## 2013-03-11 DIAGNOSIS — N186 End stage renal disease: Secondary | ICD-10-CM

## 2013-03-11 NOTE — Progress Notes (Signed)
[facility] Catheter Removal Procedure Note  Diagnosis: ESRD  Plan:  Remove right diatek catheter  Consent signed:  yes Time out completed:  yes Coumadin:  no PT/INR (if applicable):   Other labs:  Procedure: 1.  Sterile prepping and draping over catheter area 2. 9 ml 2% lidocaine plain instilled at removal site. 3.  right catheter removed in its entirety with cuff in tact. 4.  Complications:  None 5. Tip of catheter sent for culture:  no   Patient tolerated procedure well:  yes Pressure held, no bleeding noted, dressing applied Instructions given to the pt regarding wound care and bleeding.  Other:  Doreatha Massed, PA-C 03/11/2013 12:52 PM

## 2013-03-11 NOTE — Progress Notes (Signed)
[facility] SHORT STAY H&P  CC:  Catheter removal   HPI:  This is a 79 y.o. female here for diatek catheter removal.  She did have a left BVT placed 05/26/12, but this is not functional.  She does have a right tunneled dialysis catheter that was placed 01/04/13.  She has had some swelling of her right upper extremity.  Since she is currently not on HD, Nephrology wants the catheter removed to possibly help with the swelling.  If she needs HD in the future, she will have to undergo new permanent access.  She states that she did had trouble with her sugars over Thanksgiving.   Past Medical History  Diagnosis Date  . Coronary artery disease   . TIA (transient ischemic attack)   . Hypertension   . Shingles   . Stroke   . Anemia   . CAD (coronary artery disease) 02/08/2011  . Cardiomyopathy, idiopathic 02/08/2011  . Chronic kidney disease   . Angina     Takes Isosorbide  . Unstable angina 02/08/2011  . Myocardial infarct     x 3 unsure of years  . Irregular heartbeat   . Ulcer   . GERD (gastroesophageal reflux disease)     takes Protonix and zantac  . Hx of transient ischemic attack (TIA)   . Memory loss   . CHF (congestive heart failure)     Takes Lasix  . Gout     takes allopurinol  . Peripheral vascular disease     left lower  leg  . Diabetes mellitus     type 2 NIDDM x 2 years; no meds  . Chronic kidney disease (CKD), stage IV (severe)   . Arthritis   . Shortness of breath   . Thyroid disease   . Hypothyroidism     (SEVERE) Takes Levothryroxine  . Hypothyroid 02/08/2011  . Nonischemic cardiomyopathy     EF now is 55%, reduced due to myxedema, which is improved.  . Dyslipidemia   . Peripheral neuropathy   . H/O echocardiogram 09/06/11    Indication- nonIschemic Cardiomyopathy. EF = now greater than 55% with no regional wall motion abnormalities. Tthere is mild to moderate trisuspid regurgitayion and mild pulmonary hypertension with an RVSP of 35 mmHg as  well as stage 1 diastolic dysfunction and mild to moderate LVH.  Marland Kitchen Abnormal nuclear stress test 06/01/09    Demonstrated a new area of infarct scar, peri-infarct ischemia seen in the inferolateral territory. EF eas normal at 70% with mild hypocontractility at the apex, distal inferolateral wall.    FH:  Non-Contributory  History   Social History  . Marital Status: Widowed    Spouse Name: N/A    Number of Children: 4  . Years of Education: N/A   Occupational History  . homemaker    Social History Main Topics  . Smoking status: Former Smoker -- 0.50 packs/day for 40 years    Quit date: 07/05/1986  . Smokeless tobacco: Never Used     Comment: quit smoking 1988  . Alcohol Use: No  . Drug Use: No  . Sexual Activity: No   Other Topics Concern  . Not on file   Social History Narrative  . No narrative on file    Allergies  Allergen Reactions  . Aspirin Nausea And Vomiting    325 mg (adult strength) [name]     Current Outpatient Prescriptions  Medication Sig  Dispense Refill  . acetaminophen (TYLENOL) 325 MG tablet Take 2 tablets (650 mg total) by mouth every 4 (four) hours as needed.      Marland Kitchen allopurinol (ZYLOPRIM) 100 MG tablet Take 100 mg by mouth daily.       Marland Kitchen amLODipine (NORVASC) 10 MG tablet Take 5 mg by mouth daily.       Marland Kitchen aspirin 81 MG tablet Take 81 mg by mouth daily.      . calcitRIOL (ROCALTROL) 0.25 MCG capsule Take 1 capsule by mouth daily.      . calcium acetate (PHOSLO) 667 MG capsule Take 1 capsule (667 mg total) by mouth 3 (three) times daily with meals.      . carvedilol (COREG) 12.5 MG tablet Take 0.5 tablets (6.25 mg total) by mouth 2 (two) times daily with a meal.  30 tablet  0  . clopidogrel (PLAVIX) 75 MG tablet Take 75 mg by mouth daily.      Marland Kitchen docusate sodium 100 MG CAPS Take 100 mg by mouth 2 (two) times daily.  10 capsule  0  . fluconazole (DIFLUCAN) 100 MG tablet Take 100 mg by mouth  daily. For 8 days      . furosemide (LASIX) 40 MG tablet Take 1 tablet (40 mg total) by mouth 2 (two) times daily.  30 tablet    . gabapentin (NEURONTIN) 300 MG capsule Take 1 capsule (300 mg total) by mouth at bedtime.      . hydrALAZINE (APRESOLINE) 25 MG tablet Take 1 tablet (25 mg total) by mouth every 8 (eight) hours.      Marland Kitchen HYDROcodone-acetaminophen (NORCO/VICODIN) 5-325 MG per tablet Take 1 tablet by mouth every 6 (six) hours as needed.  30 tablet  0  . insulin glargine (LANTUS) 100 UNIT/ML injection Inject 0.05 mLs (5 Units total) into the skin daily.  10 mL  12  . isosorbide mononitrate (IMDUR) 60 MG 24 hr tablet Take 60 mg by mouth daily.       Marland Kitchen levothyroxine (SYNTHROID, LEVOTHROID) 75 MCG tablet Take 75 mcg by mouth daily before breakfast.      . multivitamin (RENA-VIT) TABS tablet Take 1 tablet by mouth at bedtime.    0  . nitroGLYCERIN (NITROSTAT) 0.4 MG SL tablet Place 0.4 mg under the tongue every 5 (five) minutes as needed for chest pain.       . ONGLYZA 5 MG TABS tablet Take 1 tablet by mouth daily.      . polyethylene glycol (MIRALAX / GLYCOLAX) packet Take 17 g by mouth daily.  14 each  0  . pravastatin (PRAVACHOL) 40 MG tablet Take 40 mg by mouth daily.      . travoprost, benzalkonium, (TRAVATAN) 0.004 % ophthalmic solution Place into both eyes at bedtime.       No current facility-administered medications for this encounter.    ROS:  See HPI  PHYSICAL EXAM  There were no vitals filed for this visit.  Gen:  Well developed well nourished HEENT:  normocephalic Neck:  Right IJ diatek in place Heart:  regular Lungs:  Non-labored Abdomen:  soft Extremities:  Left AKA stump with wrap Skin:  No obvious rashes Neuro:  In tact  Lab/X-ray:  none  Impression: This is a 79 y.o. female here for diatek catheter removal  Plan:  Removal of right diatek catheter  Doreatha Massed, PA-C [facility] [phone] 03/11/2013 12:48 PM

## 2013-03-28 ENCOUNTER — Other Ambulatory Visit: Payer: Self-pay | Admitting: Internal Medicine

## 2013-04-01 ENCOUNTER — Other Ambulatory Visit: Payer: Self-pay | Admitting: Internal Medicine

## 2013-04-19 ENCOUNTER — Other Ambulatory Visit: Payer: Self-pay | Admitting: Internal Medicine

## 2013-04-22 ENCOUNTER — Telehealth (HOSPITAL_COMMUNITY): Payer: Self-pay | Admitting: *Deleted

## 2013-05-07 ENCOUNTER — Ambulatory Visit: Payer: Medicare Other | Admitting: Internal Medicine

## 2013-05-17 ENCOUNTER — Encounter: Payer: Self-pay | Admitting: Internal Medicine

## 2013-05-27 ENCOUNTER — Other Ambulatory Visit: Payer: Self-pay | Admitting: Internal Medicine

## 2013-06-05 ENCOUNTER — Other Ambulatory Visit: Payer: Self-pay | Admitting: Internal Medicine

## 2013-06-06 ENCOUNTER — Ambulatory Visit (INDEPENDENT_AMBULATORY_CARE_PROVIDER_SITE_OTHER): Payer: Medicare Other | Admitting: Internal Medicine

## 2013-06-06 ENCOUNTER — Encounter: Payer: Self-pay | Admitting: Internal Medicine

## 2013-06-06 VITALS — BP 120/70 | HR 87 | Ht <= 58 in | Wt 128.0 lb

## 2013-06-06 DIAGNOSIS — I519 Heart disease, unspecified: Secondary | ICD-10-CM

## 2013-06-06 DIAGNOSIS — N184 Chronic kidney disease, stage 4 (severe): Secondary | ICD-10-CM

## 2013-06-06 DIAGNOSIS — I5033 Acute on chronic diastolic (congestive) heart failure: Secondary | ICD-10-CM

## 2013-06-06 DIAGNOSIS — I43 Cardiomyopathy in diseases classified elsewhere: Secondary | ICD-10-CM

## 2013-06-06 DIAGNOSIS — E039 Hypothyroidism, unspecified: Secondary | ICD-10-CM

## 2013-06-06 DIAGNOSIS — I509 Heart failure, unspecified: Secondary | ICD-10-CM

## 2013-06-06 DIAGNOSIS — I2510 Atherosclerotic heart disease of native coronary artery without angina pectoris: Secondary | ICD-10-CM

## 2013-06-06 MED ORDER — FUROSEMIDE 40 MG PO TABS: 80.0000 mg | ORAL_TABLET | Freq: Two times a day (BID) | ORAL | Status: DC

## 2013-06-06 NOTE — Patient Instructions (Signed)
Dr Rennis Golden has recommended making the following medication change:  INCREASE Furosemide to 80 mg (2 tablets total) TWICE DAILY  Dr Rennis Golden has ordered you to have some blood work done ON Monday. (BNP, BMP)  Your physician recommends that you schedule a follow-up appointment in 1 week with Dr Rennis Golden.

## 2013-06-07 ENCOUNTER — Encounter: Payer: Self-pay | Admitting: Internal Medicine

## 2013-06-07 NOTE — Progress Notes (Signed)
OFFICE NOTE  Chief Complaint:  Routine followup, short of breath  Primary Care Physician: Georgianne Fick, MD  HPI:  [name] is a pleasant 80 year old female with an unfortunate history of heart failure in the past due to severe hypothyroidism. EF has subsequently normalized. However, she does have significant peripheral arterial disease; underwent a left BKA. When I saw her last on December 13, she was having problems with rest pain in her right foot, and discoloration of the sole of her foot and toes, with tenderness to palpation. I had prescribed her for pain medicine and had reviewed her lower extremity arteriogram with Dr. Allyson Sabal, and felt that there were very few options for revascularization. She returned today and has reported some improvement with reduction in pain, and there does appear to be less discoloration. However, pulses still remain very weak. Her family reports she's also had a mild cough and does seem a little bit more fatigued. She did not report any worsening shortness of breath or orthopnea but has had some difficulty transferring. At her last office visit, her JVP is elevated and there was some trace right lower extremity edema. She did have basilar crackles and appeared to be volume overloaded. I recommended increasing her diuretics and ordered a BMP and BNP. She was to return to the office in 2 weeks for reassessment. Unfortunately she never made that appointment - ultimately I recheck some laboratory work and she was noted to have progressive renal failure. She was ultimately brought to the ED with complaints of worsening SOB and Wheezing. EMS was called to her home and found her O2 sats to be 70% and she was placed on supplemental O2, and had mild improvement. She was found to have findings of CHF, however, her EF on echo was 65-70%. She has acute on chronic renal failure - her fistula could not be accessed and a temporary dialysis catheter was placed. She was  dialyzed while in the hospital and was discharged to rehabilitation. She has not had any followup dialysis and is supposed to be getting weekly test of her renal function being sent to her nephrologist. I do not see where she has an appointment with her nephrologist.  [name] returns today for routine followup. She is accompanied by a friend her report she has had some shortness of breath and upper airway wheezing. Is difficult to  Record her weight due to the fact that she had a leg amputation. However she is notably more short of breath.   PMHx:  Past Medical History  Diagnosis Date  . Coronary artery disease   . TIA (transient ischemic attack)   . Hypertension   . Shingles   . Stroke   . Anemia   . CAD (coronary artery disease) 02/08/2011  . Cardiomyopathy, idiopathic 02/08/2011  . Chronic kidney disease   . Angina     Takes Isosorbide  . Unstable angina 02/08/2011  . Myocardial infarct     x 3 unsure of years  . Irregular heartbeat   . Ulcer   . GERD (gastroesophageal reflux disease)     takes Protonix and zantac  . Hx of transient ischemic attack (TIA)   . Memory loss   . CHF (congestive heart failure)     Takes Lasix  . Gout     takes allopurinol  . Peripheral vascular disease     left lower  leg  . Diabetes mellitus     type 2 NIDDM x 2 years; no  meds  . Chronic kidney disease (CKD), stage IV (severe)   . Arthritis   . Shortness of breath   . Thyroid disease   . Hypothyroidism     (SEVERE) Takes Levothryroxine  . Hypothyroid 02/08/2011  . Nonischemic cardiomyopathy     EF now is 55%, reduced due to myxedema, which is improved.  . Dyslipidemia   . Peripheral neuropathy   . H/O echocardiogram 09/06/11    Indication- nonIschemic Cardiomyopathy. EF = now greater than 55% with no regional wall motion abnormalities. Tthere is mild to moderate trisuspid regurgitayion and mild pulmonary hypertension with an RVSP of 35 mmHg as well as stage 1 diastolic dysfunction and  mild to moderate LVH.  Marland Kitchen Abnormal nuclear stress test 06/01/09    Demonstrated a new area of infarct scar, peri-infarct ischemia seen in the inferolateral territory. EF eas normal at 70% with mild hypocontractility at the apex, distal inferolateral wall.    Past Surgical History  Procedure Laterality Date  . Back surgery      [facility]  . Eye surgery      Left eye surgery; cataract removal  . Left bka  07/05/2011  . Av fistula placement    . Amputation  07/05/2011    Procedure: AMPUTATION BELOW KNEE;  Surgeon: Chuck Hint, MD;  Location: [facility] OR;  Service: Vascular;  Laterality: Left;  . Angioplasty  1988  . Cardiac catheterization  09/28/07    Demonstrated multiple sequential lesions around 40 to 30% in the RCA territory.  . Left lower extremity venous duplex Left 06/27/11    Summary: No evidence of DVT involving the left lower extremity and right common femoral vein.   . Lower extremity arterial evaluation  06/27/11    SUMMARY: Right: ABI not ascertained due to false elevation in BP secondary to calcification (posterior tibial artery is non compressible). Left: ABI indicates moderate reduction in arterial flow. Bilateral: Great toe PPG waveforms indicate adequate perfusion. Great toe pressures not obtained due to patient's movements secondary to pain.  . Duplex doppler  05/10/11    LE arterial dopplers demonstrate bilaterally reduced ABIs of 0.91 on right & 0.56 on left. She does report some decreased pain on the left, & there's moderate mixed-density plaque in the right SFA w/50 to 69% reduction. There's a 69% reduction in the left SFA & does appear to be occlusive disease of left posterior tibial artery. Right posterior dorsalis pedis artery demonstrates occlusive disease  . Insertion of dialysis catheter N/A 01/06/2013    Procedure: INSERTION OF DIALYSIS CATHETER;  Surgeon: Larina Earthly, MD;  Location: [facility] OR;  Service: Vascular;  Laterality: N/A;    FAMHx:  Family History    Problem Relation Age of Onset  . Cancer Sister     STOMACH  . Diabetes Sister   . Cancer Brother     BONE  . Diabetes Brother   . Anesthesia problems Neg Hx   . Hypotension Neg Hx   . Malignant hyperthermia Neg Hx   . Pseudochol deficiency Neg Hx   . Hyperlipidemia Daughter   . Hypertension Daughter   . Heart disease Daughter     before age 32  . Kidney disease Daughter   . Heart attack Daughter   . Other Daughter     varicose veins  . Diabetes Daughter   . Heart disease Son     before age 65  . Hyperlipidemia Son   . Hypertension Son   . Heart attack  Son     SOCHx:   reports that she quit smoking about 26 years ago. She has never used smokeless tobacco. She reports that she does not drink alcohol or use illicit drugs.  ALLERGIES:  Allergies  Allergen Reactions  . Aspirin Nausea And Vomiting    325 mg (adult strength) [name]     ROS: A comprehensive review of systems was negative except for: Constitutional: positive for fatigue Respiratory: positive for dyspnea on exertion and wheezing  HOME MEDS: Current Outpatient Prescriptions  Medication Sig Dispense Refill  . acetaminophen (TYLENOL) 325 MG tablet Take 2 tablets (650 mg total) by mouth every 4 (four) hours as needed.      Marland Kitchen allopurinol (ZYLOPRIM) 100 MG tablet Take 100 mg by mouth daily.       Marland Kitchen amLODipine (NORVASC) 10 MG tablet Take 5 mg by mouth daily.       Marland Kitchen aspirin 81 MG tablet Take 81 mg by mouth daily.      . calcitRIOL (ROCALTROL) 0.25 MCG capsule Take 1 capsule by mouth daily.      . calcium acetate (PHOSLO) 667 MG capsule Take 1 capsule (667 mg total) by mouth 3 (three) times daily with meals.      . carvedilol (COREG) 12.5 MG tablet Take 0.5 tablets (6.25 mg total) by mouth 2 (two) times daily with a meal.  30 tablet  0  . clopidogrel (PLAVIX) 75 MG tablet Take 75 mg by mouth daily.      . furosemide (LASIX) 40 MG tablet Take 2 tablets  (80 mg total) by mouth 2 (two) times daily.  120 tablet  11  . gabapentin (NEURONTIN) 300 MG capsule Take 1 capsule (300 mg total) by mouth at bedtime.      . hydrALAZINE (APRESOLINE) 25 MG tablet Take 1 tablet (25 mg total) by mouth every 8 (eight) hours.      . insulin glargine (LANTUS) 100 UNIT/ML injection Inject 36 Units into the skin daily.      . isosorbide mononitrate (IMDUR) 60 MG 24 hr tablet Take 60 mg by mouth daily.       Marland Kitchen levothyroxine (SYNTHROID, LEVOTHROID) 75 MCG tablet Take 75 mcg by mouth daily before breakfast.      . nitroGLYCERIN (NITROSTAT) 0.4 MG SL tablet Place 0.4 mg under the tongue every 5 (five) minutes as needed for chest pain.       . nortriptyline (PAMELOR) 50 MG capsule Take 1 capsule by mouth daily.      . ONGLYZA 5 MG TABS tablet Take 1 tablet by mouth daily.      . polyethylene glycol (MIRALAX / GLYCOLAX) packet Take 17 g by mouth daily.  14 each  0  . pravastatin (PRAVACHOL) 40 MG tablet Take 40 mg by mouth daily.      . travoprost, benzalkonium, (TRAVATAN) 0.004 % ophthalmic solution Place into both eyes at bedtime.      . multivitamin (RENA-VIT) TABS tablet Take 1 tablet by mouth at bedtime.    0   No current facility-administered medications for this visit.    LABS/IMAGING: No results found for this or any previous visit (from the past 48 hour(s)). No results found.  VITALS: BP 120/70  Pulse 87  Ht 4\' 9"  (1.448 m)  Wt 128 lb (58.06 kg)  BMI 27.69 kg/m2  EXAM: General appearance: alert and no distress Neck: JVD flat, no carotid bruit Lungs: diminished  breath sounds bilaterally, faint basilar crackles Heart: regular rate and rhythm Abdomen: soft, mildly protuberant Extremities: no edema on the right leg, left is BKA Pulses: faint pulse in the right foot Skin: Skin color, texture, turgor normal. No rashes or lesions Neurologic: Mental status: Alert, oriented, thought content appropriate  EKG: Normal sinus rhythm at 87,  PVC's  ASSESSMENT: 1. Acute on chronic systolic heart failure exacerbation 2. Acute on chronic kidney disease stage V-hemodialysis catheter in place 3. PAD with left BKA 4. History of severe hypothyroidism with cardiomyopathy, EF improved to 65=70% 5. ESRD, not on HD - fistula in place  PLAN: 1.   Mrs. Anselm Lisnoch is noted to have more wheezing and shortness of breath. It's difficult to tell if her weight is up due to the fact that we cannot weigh her because of her indication. I do not note any significant edema over her stump, but there are bilateral crackles. I suspect she is regaining fluid. She does have end-stage renal disease on low-dose diuretic. She has not followed up with a nephrologist but is supposed to see Dr. Darrick Pennaeterding. She does have a fistula in place.  She reports that she still makes some urine therefore I will increase her Lasix to 80 mg twice a day. Plan to see her back soon to see if she is responding to diuresis. If not she will need to see nephrology sooner and most likely may need ultrafiltration.  Chrystie NoseKenneth C. Alyzabeth Pontillo, MD, [facility] Attending Cardiologist [facility]  Curtez Brallier C 06/07/2013, 1:06 PM

## 2013-06-11 LAB — BASIC METABOLIC PANEL
BUN: 53 mg/dL — ABNORMAL HIGH (ref 6–23)
CALCIUM: 9.2 mg/dL (ref 8.4–10.5)
CO2: 29 mEq/L (ref 19–32)
CREATININE: 2.51 mg/dL — AB (ref 0.50–1.10)
Chloride: 98 mEq/L (ref 96–112)
Glucose, Bld: 100 mg/dL — ABNORMAL HIGH (ref 70–99)
Potassium: 4.5 mEq/L (ref 3.5–5.3)
Sodium: 139 mEq/L (ref 135–145)

## 2013-06-11 LAB — PRO B NATRIURETIC PEPTIDE: Pro B Natriuretic peptide (BNP): 1297 pg/mL — ABNORMAL HIGH (ref <451)

## 2013-06-14 ENCOUNTER — Ambulatory Visit (INDEPENDENT_AMBULATORY_CARE_PROVIDER_SITE_OTHER): Payer: Medicare Other | Admitting: Internal Medicine

## 2013-06-14 ENCOUNTER — Encounter: Payer: Self-pay | Admitting: Internal Medicine

## 2013-06-14 VITALS — BP 142/76 | HR 108 | Ht <= 58 in | Wt 128.0 lb

## 2013-06-14 DIAGNOSIS — N184 Chronic kidney disease, stage 4 (severe): Secondary | ICD-10-CM

## 2013-06-14 DIAGNOSIS — I5033 Acute on chronic diastolic (congestive) heart failure: Secondary | ICD-10-CM

## 2013-06-14 DIAGNOSIS — E1159 Type 2 diabetes mellitus with other circulatory complications: Secondary | ICD-10-CM

## 2013-06-14 DIAGNOSIS — I519 Heart disease, unspecified: Secondary | ICD-10-CM

## 2013-06-14 DIAGNOSIS — I43 Cardiomyopathy in diseases classified elsewhere: Secondary | ICD-10-CM

## 2013-06-14 DIAGNOSIS — I2510 Atherosclerotic heart disease of native coronary artery without angina pectoris: Secondary | ICD-10-CM

## 2013-06-14 DIAGNOSIS — I509 Heart failure, unspecified: Secondary | ICD-10-CM

## 2013-06-14 DIAGNOSIS — I739 Peripheral vascular disease, unspecified: Secondary | ICD-10-CM

## 2013-06-14 DIAGNOSIS — E1151 Type 2 diabetes mellitus with diabetic peripheral angiopathy without gangrene: Secondary | ICD-10-CM

## 2013-06-14 DIAGNOSIS — E039 Hypothyroidism, unspecified: Secondary | ICD-10-CM

## 2013-06-14 DIAGNOSIS — I798 Other disorders of arteries, arterioles and capillaries in diseases classified elsewhere: Secondary | ICD-10-CM

## 2013-06-14 DIAGNOSIS — I7092 Chronic total occlusion of artery of the extremities: Secondary | ICD-10-CM

## 2013-06-14 MED ORDER — FUROSEMIDE 80 MG PO TABS: 80.0000 mg | ORAL_TABLET | Freq: Two times a day (BID) | ORAL | Status: DC

## 2013-06-14 NOTE — Patient Instructions (Signed)
Please follow up with Dr. Darrick Penna.   Your physician wants you to follow-up in: 6 months. You will receive a reminder letter in the mail two months in advance. If you don't receive a letter, please call our office to schedule the follow-up appointment.

## 2013-06-14 NOTE — Progress Notes (Signed)
OFFICE NOTE  Chief Complaint:  Routine followup, short of breath  Primary Care Physician: Georgianne Fick, MD  HPI:  [name] is a pleasant 80 year old female with an unfortunate history of heart failure in the past due to severe hypothyroidism. EF has subsequently normalized. However, she does have significant peripheral arterial disease; underwent a left BKA. When I saw her last on December 13, she was having problems with rest pain in her right foot, and discoloration of the sole of her foot and toes, with tenderness to palpation. I had prescribed her for pain medicine and had reviewed her lower extremity arteriogram with Dr. Allyson Sabal, and felt that there were very few options for revascularization. She returned today and has reported some improvement with reduction in pain, and there does appear to be less discoloration. However, pulses still remain very weak. Her family reports she's also had a mild cough and does seem a little bit more fatigued. She did not report any worsening shortness of breath or orthopnea but has had some difficulty transferring. At her last office visit, her JVP is elevated and there was some trace right lower extremity edema. She did have basilar crackles and appeared to be volume overloaded. I recommended increasing her diuretics and ordered a BMP and BNP. She was to return to the office in 2 weeks for reassessment. Unfortunately she never made that appointment - ultimately I recheck some laboratory work and she was noted to have progressive renal failure. She was ultimately brought to the ED with complaints of worsening SOB and Wheezing. EMS was called to her home and found her O2 sats to be 70% and she was placed on supplemental O2, and had mild improvement. She was found to have findings of CHF, however, her EF on echo was 65-70%. She has acute on chronic renal failure - her fistula could not be accessed and a temporary dialysis catheter was placed. She was  dialyzed while in the hospital and was discharged to rehabilitation. She has not had any followup dialysis and is supposed to be getting weekly test of her renal function being sent to her nephrologist. I do not see where she has an appointment with her nephrologist.  [name] returns today for routine followup. Or short of breath at her last office visit and I increased her Lasix to 80 mg twice daily. She returns today and has a cold with cough and upper respiratory symptoms. She has not noted any significant symptomatic change. Her laboratory work shows stable creatinine of around 2.5 with elevated BNP is less than it has been in the past.  We can still not obtain an accurate weight.  PMHx:  Past Medical History  Diagnosis Date  . Coronary artery disease   . TIA (transient ischemic attack)   . Hypertension   . Shingles   . Stroke   . Anemia   . CAD (coronary artery disease) 02/08/2011  . Cardiomyopathy, idiopathic 02/08/2011  . Chronic kidney disease   . Angina     Takes Isosorbide  . Unstable angina 02/08/2011  . Myocardial infarct     x 3 unsure of years  . Irregular heartbeat   . Ulcer   . GERD (gastroesophageal reflux disease)     takes Protonix and zantac  . Hx of transient ischemic attack (TIA)   . Memory loss   . CHF (congestive heart failure)     Takes Lasix  . Gout     takes allopurinol  .  Peripheral vascular disease     left lower  leg  . Diabetes mellitus     type 2 NIDDM x 2 years; no meds  . Chronic kidney disease (CKD), stage IV (severe)   . Arthritis   . Shortness of breath   . Thyroid disease   . Hypothyroidism     (SEVERE) Takes Levothryroxine  . Hypothyroid 02/08/2011  . Nonischemic cardiomyopathy     EF now is 55%, reduced due to myxedema, which is improved.  . Dyslipidemia   . Peripheral neuropathy   . H/O echocardiogram 09/06/11    Indication- nonIschemic Cardiomyopathy. EF = now greater than 55% with no regional wall motion abnormalities. Tthere  is mild to moderate trisuspid regurgitayion and mild pulmonary hypertension with an RVSP of 35 mmHg as well as stage 1 diastolic dysfunction and mild to moderate LVH.  Marland Kitchen. Abnormal nuclear stress test 06/01/09    Demonstrated a new area of infarct scar, peri-infarct ischemia seen in the inferolateral territory. EF eas normal at 70% with mild hypocontractility at the apex, distal inferolateral wall.    Past Surgical History  Procedure Laterality Date  . [facility]  . Eye surgery      Left eye surgery; cataract removal  . Left bka  07/05/2011  . Av fistula placement    . Amputation  07/05/2011    Procedure: AMPUTATION BELOW KNEE;  Surgeon: Chuck Hinthristopher S Dickson, MD;  [facility];  Service: Vascular;  Laterality: Left;  . Angioplasty  1988  . Cardiac catheterization  09/28/07    Demonstrated multiple sequential lesions around 40 to 30% in the RCA territory.  . Left lower extremity venous duplex Left 06/27/11    Summary: No evidence of DVT involving the left lower extremity and right common femoral vein.   . Lower extremity arterial evaluation  06/27/11    SUMMARY: Right: ABI not ascertained due to false elevation in BP secondary to calcification (posterior tibial artery is non compressible). Left: ABI indicates moderate reduction in arterial flow. Bilateral: Great toe PPG waveforms indicate adequate perfusion. Great toe pressures not obtained due to patient's movements secondary to pain.  . Duplex doppler  05/10/11    LE arterial dopplers demonstrate bilaterally reduced ABIs of 0.91 on right & 0.56 on left. She does report some decreased pain on the left, & there's moderate mixed-density plaque in the right SFA w/50 to 69% reduction. There's a 69% reduction in the left SFA & does appear to be occlusive disease of left posterior tibial artery. Right posterior dorsalis pedis artery demonstrates occlusive disease  . Insertion of dialysis catheter N/A 01/06/2013    Procedure:  INSERTION OF DIALYSIS CATHETER;  Surgeon: Larina Earthlyodd F Early, MD;  [facility];  Service: Vascular;  Laterality: N/A;    FAMHx:  Family History  Problem Relation Age of Onset  . Cancer Sister     STOMACH  . Diabetes Sister   . Cancer Brother     BONE  . Diabetes Brother   . Anesthesia problems Neg Hx   . Hypotension Neg Hx   . Malignant hyperthermia Neg Hx   . Pseudochol deficiency Neg Hx   . Hyperlipidemia Daughter   . Hypertension Daughter   . Heart disease Daughter     before age 78  . Kidney disease Daughter   . Heart attack Daughter   . Other Daughter     varicose veins  . Diabetes Daughter   .  Heart disease Son     before age 53  . Hyperlipidemia Son   . Hypertension Son   . Heart attack Son     SOCHx:   reports that she quit smoking about 26 years ago. She has never used smokeless tobacco. She reports that she does not drink alcohol or use illicit drugs.  ALLERGIES:  Allergies  Allergen Reactions  . Aspirin Nausea And Vomiting    325 mg (adult strength) [name]     ROS: A comprehensive review of systems was negative except for: Constitutional: positive for fatigue Respiratory: positive for dyspnea on exertion and wheezing  HOME MEDS: Current Outpatient Prescriptions  Medication Sig Dispense Refill  . acetaminophen (TYLENOL) 325 MG tablet Take 2 tablets (650 mg total) by mouth every 4 (four) hours as needed.      Marland Kitchen allopurinol (ZYLOPRIM) 100 MG tablet Take 100 mg by mouth daily.       Marland Kitchen amLODipine (NORVASC) 10 MG tablet Take 5 mg by mouth daily.       Marland Kitchen aspirin 81 MG tablet Take 81 mg by mouth daily.      . calcitRIOL (ROCALTROL) 0.25 MCG capsule Take 1 capsule by mouth daily.      . calcium acetate (PHOSLO) 667 MG capsule Take 1 capsule (667 mg total) by mouth 3 (three) times daily with meals.      . carvedilol (COREG) 12.5 MG tablet Take 0.5 tablets (6.25 mg total) by mouth 2 (two) times daily  with a meal.  30 tablet  0  . clopidogrel (PLAVIX) 75 MG tablet Take 75 mg by mouth daily.      . furosemide (LASIX) 80 MG tablet Take 1 tablet (80 mg total) by mouth 2 (two) times daily.  60 tablet  6  . gabapentin (NEURONTIN) 300 MG capsule Take 1 capsule (300 mg total) by mouth at bedtime.      . hydrALAZINE (APRESOLINE) 25 MG tablet Take 1 tablet (25 mg total) by mouth every 8 (eight) hours.      . insulin glargine (LANTUS) 100 UNIT/ML injection Inject 36 Units into the skin daily.      . isosorbide mononitrate (IMDUR) 60 MG 24 hr tablet Take 60 mg by mouth daily.       Marland Kitchen levothyroxine (SYNTHROID, LEVOTHROID) 75 MCG tablet Take 75 mcg by mouth daily before breakfast.      . multivitamin (RENA-VIT) TABS tablet Take 1 tablet by mouth at bedtime.    0  . nitroGLYCERIN (NITROSTAT) 0.4 MG SL tablet Place 0.4 mg under the tongue every 5 (five) minutes as needed for chest pain.       . nortriptyline (PAMELOR) 50 MG capsule Take 1 capsule by mouth daily.      . ONGLYZA 5 MG TABS tablet Take 1 tablet by mouth daily.      . polyethylene glycol (MIRALAX / GLYCOLAX) packet Take 17 g by mouth daily.  14 each  0  . pravastatin (PRAVACHOL) 40 MG tablet Take 40 mg by mouth daily.      . travoprost, benzalkonium, (TRAVATAN) 0.004 % ophthalmic solution Place into both eyes at bedtime.       No current facility-administered medications for this visit.    LABS/IMAGING: No results found for this or any previous visit (from the past 48 hour(s)). No results found.  VITALS: BP 142/76  Pulse 108  Ht 4\' 9"  (1.448 m)  Wt  128 lb (58.06 kg)  BMI 27.69 kg/m2  EXAM: General appearance: alert and no distress Neck: JVD flat, no carotid bruit Lungs: diminished breath sounds bilaterally, faint basilar crackles Heart: regular rate and rhythm Abdomen: soft, mildly protuberant Extremities: no edema on the right leg, left is BKA Pulses: faint pulse in the right foot Skin: Skin color, texture, turgor normal. No  rashes or lesions Neurologic: Mental status: Alert, oriented, thought content appropriate  EKG: deferred  ASSESSMENT: 1. Acute on chronic systolic heart failure exacerbation 2. Acute on chronic kidney disease stage V-hemodialysis catheter in place 3. PAD with left BKA 4. History of severe hypothyroidism with cardiomyopathy, EF improved to 65=70% 5. ESRD, not on HD - fistula in place  PLAN: 1.   Mrs. Kucinski really did not note any significant change in her shortness of breath with the increased dose of Lasix. Her creatinine remained stable her BNP is elevated. She may be making him slightly more urine but she makes very little as is. She has not yet apparently ready for dialysis and is followed by Dr. Darrick Penna. I am hesitant to make any further changes in her Lasix due to the potential to worsen her renal function. Plan to see her back in 6 months or sooner as necessary.  Chrystie Nose, MD, [facility] Attending Cardiologist [facility]  Laiyla Slagel C 06/14/2013, 3:47 PM

## 2013-06-15 ENCOUNTER — Emergency Department (HOSPITAL_COMMUNITY): Payer: Medicare Other

## 2013-06-15 ENCOUNTER — Observation Stay (HOSPITAL_COMMUNITY): Payer: Medicare Other

## 2013-06-15 ENCOUNTER — Observation Stay (HOSPITAL_COMMUNITY)
Admission: EM | Admit: 2013-06-15 | Discharge: 2013-06-17 | Disposition: A | Discharge status: Home / Self Care | Payer: Medicare Other | Attending: Internal Medicine | Admitting: Internal Medicine

## 2013-06-15 ENCOUNTER — Encounter (HOSPITAL_COMMUNITY): Payer: Self-pay | Admitting: Emergency Medicine

## 2013-06-15 DIAGNOSIS — E785 Hyperlipidemia, unspecified: Secondary | ICD-10-CM | POA: Insufficient documentation

## 2013-06-15 DIAGNOSIS — R4182 Altered mental status, unspecified: Secondary | ICD-10-CM

## 2013-06-15 DIAGNOSIS — R509 Fever, unspecified: Secondary | ICD-10-CM

## 2013-06-15 DIAGNOSIS — I5032 Chronic diastolic (congestive) heart failure: Secondary | ICD-10-CM | POA: Insufficient documentation

## 2013-06-15 DIAGNOSIS — K219 Gastro-esophageal reflux disease without esophagitis: Secondary | ICD-10-CM | POA: Insufficient documentation

## 2013-06-15 DIAGNOSIS — G609 Hereditary and idiopathic neuropathy, unspecified: Secondary | ICD-10-CM | POA: Insufficient documentation

## 2013-06-15 DIAGNOSIS — R5383 Other fatigue: Secondary | ICD-10-CM

## 2013-06-15 DIAGNOSIS — Z794 Long term (current) use of insulin: Secondary | ICD-10-CM | POA: Insufficient documentation

## 2013-06-15 DIAGNOSIS — N189 Chronic kidney disease, unspecified: Secondary | ICD-10-CM

## 2013-06-15 DIAGNOSIS — M109 Gout, unspecified: Secondary | ICD-10-CM | POA: Insufficient documentation

## 2013-06-15 DIAGNOSIS — Z87891 Personal history of nicotine dependence: Secondary | ICD-10-CM | POA: Insufficient documentation

## 2013-06-15 DIAGNOSIS — I7092 Chronic total occlusion of artery of the extremities: Secondary | ICD-10-CM

## 2013-06-15 DIAGNOSIS — I2510 Atherosclerotic heart disease of native coronary artery without angina pectoris: Secondary | ICD-10-CM | POA: Insufficient documentation

## 2013-06-15 DIAGNOSIS — R001 Bradycardia, unspecified: Secondary | ICD-10-CM

## 2013-06-15 DIAGNOSIS — S88119A Complete traumatic amputation at level between knee and ankle, unspecified lower leg, initial encounter: Secondary | ICD-10-CM | POA: Insufficient documentation

## 2013-06-15 DIAGNOSIS — I5033 Acute on chronic diastolic (congestive) heart failure: Secondary | ICD-10-CM

## 2013-06-15 DIAGNOSIS — Z8673 Personal history of transient ischemic attack (TIA), and cerebral infarction without residual deficits: Secondary | ICD-10-CM | POA: Insufficient documentation

## 2013-06-15 DIAGNOSIS — Z992 Dependence on renal dialysis: Secondary | ICD-10-CM | POA: Insufficient documentation

## 2013-06-15 DIAGNOSIS — I739 Peripheral vascular disease, unspecified: Secondary | ICD-10-CM

## 2013-06-15 DIAGNOSIS — I509 Heart failure, unspecified: Secondary | ICD-10-CM | POA: Insufficient documentation

## 2013-06-15 DIAGNOSIS — D649 Anemia, unspecified: Secondary | ICD-10-CM

## 2013-06-15 DIAGNOSIS — G9340 Encephalopathy, unspecified: Secondary | ICD-10-CM | POA: Diagnosis present

## 2013-06-15 DIAGNOSIS — I5189 Other ill-defined heart diseases: Secondary | ICD-10-CM

## 2013-06-15 DIAGNOSIS — G9349 Other encephalopathy: Principal | ICD-10-CM | POA: Insufficient documentation

## 2013-06-15 DIAGNOSIS — E1151 Type 2 diabetes mellitus with diabetic peripheral angiopathy without gangrene: Secondary | ICD-10-CM

## 2013-06-15 DIAGNOSIS — I428 Other cardiomyopathies: Secondary | ICD-10-CM | POA: Insufficient documentation

## 2013-06-15 DIAGNOSIS — I2589 Other forms of chronic ischemic heart disease: Secondary | ICD-10-CM | POA: Insufficient documentation

## 2013-06-15 DIAGNOSIS — N179 Acute kidney failure, unspecified: Secondary | ICD-10-CM

## 2013-06-15 DIAGNOSIS — I519 Heart disease, unspecified: Secondary | ICD-10-CM

## 2013-06-15 DIAGNOSIS — I129 Hypertensive chronic kidney disease with stage 1 through stage 4 chronic kidney disease, or unspecified chronic kidney disease: Secondary | ICD-10-CM | POA: Insufficient documentation

## 2013-06-15 DIAGNOSIS — E039 Hypothyroidism, unspecified: Secondary | ICD-10-CM | POA: Insufficient documentation

## 2013-06-15 DIAGNOSIS — N184 Chronic kidney disease, stage 4 (severe): Secondary | ICD-10-CM | POA: Insufficient documentation

## 2013-06-15 DIAGNOSIS — J40 Bronchitis, not specified as acute or chronic: Secondary | ICD-10-CM

## 2013-06-15 DIAGNOSIS — E1159 Type 2 diabetes mellitus with other circulatory complications: Secondary | ICD-10-CM | POA: Insufficient documentation

## 2013-06-15 DIAGNOSIS — I70209 Unspecified atherosclerosis of native arteries of extremities, unspecified extremity: Secondary | ICD-10-CM | POA: Insufficient documentation

## 2013-06-15 LAB — URINE MICROSCOPIC-ADD ON

## 2013-06-15 LAB — URINALYSIS, ROUTINE W REFLEX MICROSCOPIC
Bilirubin Urine: NEGATIVE
GLUCOSE, UA: NEGATIVE mg/dL
Hgb urine dipstick: NEGATIVE
Ketones, ur: NEGATIVE mg/dL
Leukocytes, UA: NEGATIVE
Nitrite: NEGATIVE
PH: 5 (ref 5.0–8.0)
PROTEIN: 100 mg/dL — AB
Specific Gravity, Urine: 1.017 (ref 1.005–1.030)
Urobilinogen, UA: 0.2 mg/dL (ref 0.0–1.0)

## 2013-06-15 LAB — CBC WITH DIFFERENTIAL/PLATELET
BASOS ABS: 0 10*3/uL (ref 0.0–0.1)
BASOS PCT: 1 % (ref 0–1)
EOS PCT: 0 % (ref 0–5)
Eosinophils Absolute: 0 10*3/uL (ref 0.0–0.7)
HCT: 41.9 % (ref 36.0–46.0)
Hemoglobin: 14 g/dL (ref 12.0–15.0)
Lymphocytes Relative: 17 % (ref 12–46)
Lymphs Abs: 1.5 10*3/uL (ref 0.7–4.0)
MCH: 28.6 pg (ref 26.0–34.0)
MCHC: 33.4 g/dL (ref 30.0–36.0)
MCV: 85.5 fL (ref 78.0–100.0)
Monocytes Absolute: 1 10*3/uL (ref 0.1–1.0)
Monocytes Relative: 12 % (ref 3–12)
Neutro Abs: 6.3 10*3/uL (ref 1.7–7.7)
Neutrophils Relative %: 71 % (ref 43–77)
Platelets: 456 10*3/uL — ABNORMAL HIGH (ref 150–400)
RBC: 4.9 MIL/uL (ref 3.87–5.11)
RDW: 15.3 % (ref 11.5–15.5)
WBC: 8.8 10*3/uL (ref 4.0–10.5)

## 2013-06-15 LAB — CBG MONITORING, ED: Glucose-Capillary: 129 mg/dL — ABNORMAL HIGH (ref 70–99)

## 2013-06-15 LAB — COMPREHENSIVE METABOLIC PANEL
ALBUMIN: 3.8 g/dL (ref 3.5–5.2)
ALT: 13 U/L (ref 0–35)
AST: 26 U/L (ref 0–37)
Alkaline Phosphatase: 96 U/L (ref 39–117)
BUN: 62 mg/dL — ABNORMAL HIGH (ref 6–23)
CALCIUM: 10 mg/dL (ref 8.4–10.5)
CO2: 21 mEq/L (ref 19–32)
CREATININE: 2.56 mg/dL — AB (ref 0.50–1.10)
Chloride: 93 mEq/L — ABNORMAL LOW (ref 96–112)
GFR calc Af Amer: 18 mL/min — ABNORMAL LOW (ref >90)
GFR calc non Af Amer: 16 mL/min — ABNORMAL LOW (ref >90)
Glucose, Bld: 194 mg/dL — ABNORMAL HIGH (ref 70–99)
Potassium: 4.6 mEq/L (ref 3.7–5.3)
Sodium: 137 mEq/L (ref 137–147)
Total Bilirubin: 0.5 mg/dL (ref 0.3–1.2)
Total Protein: 8.4 g/dL — ABNORMAL HIGH (ref 6.0–8.3)

## 2013-06-15 LAB — TROPONIN I: Troponin I: <0.3 ng/mL (ref <0.30)

## 2013-06-15 LAB — GLUCOSE, CAPILLARY: GLUCOSE-CAPILLARY: 122 mg/dL — AB (ref 70–99)

## 2013-06-15 MED ORDER — VANCOMYCIN HCL IN DEXTROSE 750-5 MG/150ML-% IV SOLN
750.0000 mg | INTRAVENOUS | Status: DC
  Filled 2013-06-15: qty 150

## 2013-06-15 MED ORDER — ACETAMINOPHEN 325 MG PO TABS
650.0000 mg | ORAL_TABLET | Freq: Once | ORAL | Status: AC
  Administered 2013-06-15: 650 mg via ORAL
  Filled 2013-06-15: qty 2

## 2013-06-15 MED ORDER — HEPARIN SODIUM (PORCINE) 5000 UNIT/ML IJ SOLN
5000.0000 [IU] | Freq: Three times a day (TID) | INTRAMUSCULAR | Status: DC
  Administered 2013-06-16 – 2013-06-17 (×6): 5000 [IU] via SUBCUTANEOUS
  Filled 2013-06-15 (×9): qty 1

## 2013-06-15 MED ORDER — ASPIRIN EC 81 MG PO TBEC
81.0000 mg | DELAYED_RELEASE_TABLET | Freq: Every day | ORAL | Status: DC
  Administered 2013-06-15 – 2013-06-17 (×3): 81 mg via ORAL
  Filled 2013-06-15 (×3): qty 1

## 2013-06-15 MED ORDER — SODIUM CHLORIDE 0.9 % IV SOLN
INTRAVENOUS | Status: DC
  Administered 2013-06-15 – 2013-06-16 (×2): via INTRAVENOUS

## 2013-06-15 MED ORDER — ISOSORBIDE MONONITRATE ER 60 MG PO TB24
60.0000 mg | ORAL_TABLET | Freq: Every day | ORAL | Status: DC
  Administered 2013-06-15 – 2013-06-17 (×3): 60 mg via ORAL
  Filled 2013-06-15 (×3): qty 1

## 2013-06-15 MED ORDER — TRAVOPROST (BAK FREE) 0.004 % OP SOLN
1.0000 [drp] | Freq: Every day | OPHTHALMIC | Status: DC
  Administered 2013-06-15 – 2013-06-16 (×2): 1 [drp] via OPHTHALMIC
  Filled 2013-06-15: qty 2.5

## 2013-06-15 MED ORDER — SODIUM CHLORIDE 0.9 % IV BOLUS (SEPSIS)
500.0000 mL | Freq: Once | INTRAVENOUS | Status: AC
  Administered 2013-06-15: 500 mL via INTRAVENOUS

## 2013-06-15 MED ORDER — NORTRIPTYLINE HCL 25 MG PO CAPS
50.0000 mg | ORAL_CAPSULE | Freq: Every day | ORAL | Status: DC
  Administered 2013-06-15 – 2013-06-17 (×3): 50 mg via ORAL
  Filled 2013-06-15 (×3): qty 2

## 2013-06-15 MED ORDER — CALCIUM ACETATE 667 MG PO CAPS
667.0000 mg | ORAL_CAPSULE | Freq: Three times a day (TID) | ORAL | Status: DC
  Administered 2013-06-16 – 2013-06-17 (×5): 667 mg via ORAL
  Filled 2013-06-15 (×7): qty 1

## 2013-06-15 MED ORDER — ACETAMINOPHEN 325 MG PO TABS: 650.0000 mg | ORAL_TABLET | ORAL | Status: DC | PRN

## 2013-06-15 MED ORDER — FUROSEMIDE 40 MG PO TABS
40.0000 mg | ORAL_TABLET | Freq: Two times a day (BID) | ORAL | Status: DC
  Administered 2013-06-15 – 2013-06-17 (×5): 40 mg via ORAL
  Filled 2013-06-15 (×7): qty 1

## 2013-06-15 MED ORDER — GABAPENTIN 300 MG PO CAPS
300.0000 mg | ORAL_CAPSULE | Freq: Every day | ORAL | Status: DC
  Administered 2013-06-15 – 2013-06-16 (×2): 300 mg via ORAL
  Filled 2013-06-15 (×3): qty 1

## 2013-06-15 MED ORDER — VANCOMYCIN HCL IN DEXTROSE 750-5 MG/150ML-% IV SOLN
750.0000 mg | Freq: Once | INTRAVENOUS | Status: AC
  Administered 2013-06-15: 750 mg via INTRAVENOUS
  Filled 2013-06-15: qty 150

## 2013-06-15 MED ORDER — HYDRALAZINE HCL 25 MG PO TABS
25.0000 mg | ORAL_TABLET | Freq: Three times a day (TID) | ORAL | Status: DC
  Administered 2013-06-15 – 2013-06-17 (×6): 25 mg via ORAL
  Filled 2013-06-15 (×8): qty 1

## 2013-06-15 MED ORDER — PIPERACILLIN-TAZOBACTAM IN DEX 2-0.25 GM/50ML IV SOLN
2.2500 g | Freq: Three times a day (TID) | INTRAVENOUS | Status: DC
  Administered 2013-06-16 – 2013-06-17 (×5): 2.25 g via INTRAVENOUS
  Filled 2013-06-15 (×9): qty 50

## 2013-06-15 MED ORDER — LEVOTHYROXINE SODIUM 75 MCG PO TABS
75.0000 ug | ORAL_TABLET | Freq: Every day | ORAL | Status: DC
  Administered 2013-06-16 – 2013-06-17 (×2): 75 ug via ORAL
  Filled 2013-06-15 (×3): qty 1

## 2013-06-15 MED ORDER — ALLOPURINOL 100 MG PO TABS
100.0000 mg | ORAL_TABLET | Freq: Every day | ORAL | Status: DC
  Administered 2013-06-15 – 2013-06-17 (×3): 100 mg via ORAL
  Filled 2013-06-15 (×4): qty 1

## 2013-06-15 MED ORDER — RENA-VITE PO TABS
1.0000 | ORAL_TABLET | Freq: Every day | ORAL | Status: DC
  Administered 2013-06-15 – 2013-06-16 (×2): 1 via ORAL
  Filled 2013-06-15 (×3): qty 1

## 2013-06-15 MED ORDER — SIMVASTATIN 20 MG PO TABS
20.0000 mg | ORAL_TABLET | Freq: Every day | ORAL | Status: DC
  Administered 2013-06-16 – 2013-06-17 (×2): 20 mg via ORAL
  Filled 2013-06-15 (×2): qty 1

## 2013-06-15 MED ORDER — INSULIN GLARGINE 100 UNIT/ML ~~LOC~~ SOLN
30.0000 [IU] | Freq: Every day | SUBCUTANEOUS | Status: DC
  Administered 2013-06-16 – 2013-06-17 (×2): 30 [IU] via SUBCUTANEOUS
  Filled 2013-06-15 (×2): qty 0.3

## 2013-06-15 MED ORDER — CALCITRIOL 0.25 MCG PO CAPS
0.2500 ug | ORAL_CAPSULE | Freq: Every day | ORAL | Status: DC
  Administered 2013-06-15 – 2013-06-17 (×3): 0.25 ug via ORAL
  Filled 2013-06-15 (×3): qty 1

## 2013-06-15 MED ORDER — CARVEDILOL 6.25 MG PO TABS
6.2500 mg | ORAL_TABLET | Freq: Two times a day (BID) | ORAL | Status: DC
  Administered 2013-06-16 – 2013-06-17 (×4): 6.25 mg via ORAL
  Filled 2013-06-15 (×5): qty 1

## 2013-06-15 MED ORDER — INSULIN ASPART 100 UNIT/ML ~~LOC~~ SOLN
0.0000 [IU] | SUBCUTANEOUS | Status: DC
  Administered 2013-06-15 – 2013-06-16 (×2): 1 [IU] via SUBCUTANEOUS
  Administered 2013-06-16 – 2013-06-17 (×4): 2 [IU] via SUBCUTANEOUS

## 2013-06-15 MED ORDER — AMLODIPINE BESYLATE 5 MG PO TABS
5.0000 mg | ORAL_TABLET | Freq: Every day | ORAL | Status: DC
  Administered 2013-06-15 – 2013-06-17 (×3): 5 mg via ORAL
  Filled 2013-06-15 (×3): qty 1

## 2013-06-15 MED ORDER — NITROGLYCERIN 0.4 MG SL SUBL: 0.4000 mg | SUBLINGUAL_TABLET | SUBLINGUAL | Status: DC | PRN

## 2013-06-15 MED ORDER — SODIUM CHLORIDE 0.9 % IV BOLUS (SEPSIS): 1000.0000 mL | Freq: Once | INTRAVENOUS | Status: DC

## 2013-06-15 MED ORDER — PIPERACILLIN-TAZOBACTAM 3.375 G IVPB 30 MIN
3.3750 g | Freq: Once | INTRAVENOUS | Status: AC
  Administered 2013-06-15: 3.375 g via INTRAVENOUS
  Filled 2013-06-15: qty 50

## 2013-06-15 MED ORDER — CLOPIDOGREL BISULFATE 75 MG PO TABS
75.0000 mg | ORAL_TABLET | Freq: Every day | ORAL | Status: DC
  Administered 2013-06-15 – 2013-06-17 (×3): 75 mg via ORAL
  Filled 2013-06-15 (×3): qty 1

## 2013-06-15 NOTE — ED Notes (Addendum)
Patient back form Xray

## 2013-06-15 NOTE — ED Notes (Signed)
Patient brought in via [facility] with complaints of generalized weakness, SOB and fever times two days, lungs CTA, Dry non productive cough. CBG 258 history of DM

## 2013-06-15 NOTE — H&P (Signed)
Hospitalist Admission History and Physical  Patient name: [name] Medical record number: [id number] Date of birth: February 17, 1924 Age: 80 y.o. Gender: female  Primary Care Provider: Georgianne Fick, MD  Chief Complaint: encephalopathy, weakness, dehydration History of Present Illness:This is a 80 y.o. year old female with multiple medical problems including diastolic CHF, stage V chronic kidney disease, coronary artery disease, insulin-dependent diabetes presenting with encephalopathy, dehydration and weakness. Patient is noted to have had a fairly complicated hospital course October 2014 with noted CHF exacerbation requiring hemodialysis via permacath. Patient is followed by [facility]. There is a reported increase in CHF symptoms about 2 weeks ago. Patient's Lasix dose was increased. Have followup with cardiology yesterday with reported improvement in shortness of breath. However, the family states the patient has had progressive confusion, lethargy as well as shortness of breath and weakness since earlier today. Patient also had a elevated temperature though not checked at home. Family states the patient is able to ambulate out of the wheelchair on a regular basis. Has her son that she lives with lesions takes care of her. However, he is going in for inpatient evaluation today as well. Patient's family deny any chest pains, nausea, vomiting, diarrhea, dysuria. Family states the patient has had increased fluid intake over the past 24 hours. Urine output has been fairly stable.  In the ER, patient with noted temperature of 101.4. Hemodynamically stable with blood pressures in the 120s to 180s. White blood cell count within normal limits at 8.8. Hemoglobin 14. Creatinine of 2.56. BUN 62. Chest x-ray negative for pneumonia but does show some mild bronchitic changes. Head CT with no acute abnormalities. UA negative for infection. Pt given 1/2L NS bolus with improvement in  weakness and lethargy.    Patient Active Problem List   Diagnosis Date Noted  . Chronic total occlusion of artery of the extremities 02/27/2013  . Acute on chronic diastolic CHF , improved with HD & UF 01/05/2013  . Anemia 01/05/2013  . Acute on chronic renal failure 01/04/13 01/05/2013  . Diastolic dysfunction 01/05/2013  . Dyslipidemia 01/05/2013  . Chronic renal insufficiency, stage IV (severe)- HD started 01/05/13 01/05/2013  . Bradycardia on admission 01/04/13, resolved with HD and decreased K+ 01/05/2013  . Diabetes mellitus type 2 with peripheral artery disease 01/05/2013  . Peripheral vascular disease, hx Lt BKA, known RLE disease 10/19/2011  . CAD - moderate at cath July 2012 (medical Rx) 02/08/2011  . 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 Hypothyroid 02/08/2011   Past Medical History: Past Medical History  Diagnosis Date  . Coronary artery disease   . TIA (transient ischemic attack)   . Hypertension   . Shingles   . Stroke   . Anemia   . CAD (coronary artery disease) 02/08/2011  . Cardiomyopathy, idiopathic 02/08/2011  . Chronic kidney disease   . Angina     Takes Isosorbide  . Unstable angina 02/08/2011  . Myocardial infarct     x 3 unsure of years  . Irregular heartbeat   . Ulcer   . GERD (gastroesophageal reflux disease)     takes Protonix and zantac  . Hx of transient ischemic attack (TIA)   . Memory loss   . CHF (congestive heart failure)     Takes Lasix  . Gout     takes allopurinol  . Peripheral vascular disease     left lower  leg  . Diabetes mellitus     type 2  NIDDM x 2 years; no meds  . Chronic kidney disease (CKD), stage IV (severe)   . Arthritis   . Shortness of breath   . Thyroid disease   . Hypothyroidism     (SEVERE) Takes Levothryroxine  . Hypothyroid 02/08/2011  . Nonischemic cardiomyopathy     EF now is 55%, reduced due to myxedema, which is improved.  . Dyslipidemia   . Peripheral neuropathy   . H/O  echocardiogram 09/06/11    Indication- nonIschemic Cardiomyopathy. EF = now greater than 55% with no regional wall motion abnormalities. Tthere is mild to moderate trisuspid regurgitayion and mild pulmonary hypertension with an RVSP of 35 mmHg as well as stage 1 diastolic dysfunction and mild to moderate LVH.  Marland Kitchen Abnormal nuclear stress test 06/01/09    Demonstrated a new area of infarct scar, peri-infarct ischemia seen in the inferolateral territory. EF eas normal at 70% with mild hypocontractility at the apex, distal inferolateral wall.    Past Surgical History: Past Surgical History  Procedure Laterality Date  . Back surgery      [facility]  . Eye surgery      Left eye surgery; cataract removal  . Left bka  07/05/2011  . Av fistula placement    . Amputation  07/05/2011    Procedure: AMPUTATION BELOW KNEE;  Surgeon: Chuck Hint, MD;  Location: [facility] OR;  Service: Vascular;  Laterality: Left;  . Angioplasty  1988  . Cardiac catheterization  09/28/07    Demonstrated multiple sequential lesions around 40 to 30% in the RCA territory.  . Left lower extremity venous duplex Left 06/27/11    Summary: No evidence of DVT involving the left lower extremity and right common femoral vein.   . Lower extremity arterial evaluation  06/27/11    SUMMARY: Right: ABI not ascertained due to false elevation in BP secondary to calcification (posterior tibial artery is non compressible). Left: ABI indicates moderate reduction in arterial flow. Bilateral: Great toe PPG waveforms indicate adequate perfusion. Great toe pressures not obtained due to patient's movements secondary to pain.  . Duplex doppler  05/10/11    LE arterial dopplers demonstrate bilaterally reduced ABIs of 0.91 on right & 0.56 on left. She does report some decreased pain on the left, & there's moderate mixed-density plaque in the right SFA w/50 to 69% reduction. There's a 69% reduction in the left SFA & does appear to be occlusive disease  of left posterior tibial artery. Right posterior dorsalis pedis artery demonstrates occlusive disease  . Insertion of dialysis catheter N/A 01/06/2013    Procedure: INSERTION OF DIALYSIS CATHETER;  Surgeon: Larina Earthly, MD;  Location: [facility] OR;  Service: Vascular;  Laterality: N/A;    Social History: History   Social History  . Marital Status: Widowed    Spouse Name: N/A    Number of Children: 4  . Years of Education: N/A   Occupational History  . homemaker    Social History Main Topics  . Smoking status: Former Smoker -- 0.50 packs/day for 40 years    Quit date: 07/05/1986  . Smokeless tobacco: Never Used     Comment: quit smoking 1988  . Alcohol Use: No  . Drug Use: No  . Sexual Activity: No   Other Topics Concern  . None   Social History Narrative  . None    Family History: Family History  Problem Relation Age of Onset  . Cancer Sister     STOMACH  . Diabetes  Sister   . Cancer Brother     BONE  . Diabetes Brother   . Anesthesia problems Neg Hx   . Hypotension Neg Hx   . Malignant hyperthermia Neg Hx   . Pseudochol deficiency Neg Hx   . Hyperlipidemia Daughter   . Hypertension Daughter   . Heart disease Daughter     before age 7  . Kidney disease Daughter   . Heart attack Daughter   . Other Daughter     varicose veins  . Diabetes Daughter   . Heart disease Son     before age 54  . Hyperlipidemia Son   . Hypertension Son   . Heart attack Son     Allergies: Allergies  Allergen Reactions  . Aspirin Nausea And Vomiting    325 mg (adult strength) [name]     Current Facility-Administered Medications  Medication Dose Route Frequency Provider Last Rate Last Dose  . 0.9 %  sodium chloride infusion   Intravenous Continuous Doree Albee, MD      . acetaminophen (TYLENOL) tablet 650 mg  650 mg Oral Q4H PRN Doree Albee, MD      . allopurinol (ZYLOPRIM) tablet 100 mg  100 mg Oral Daily Doree Albee, MD      . amLODipine (NORVASC) tablet 5 mg  5 mg Oral Daily Doree Albee, MD      . aspirin EC tablet 81 mg  81 mg Oral Daily Doree Albee, MD      . calcitRIOL (ROCALTROL) capsule 0.25 mcg  0.25 mcg Oral Daily Doree Albee, MD      . Melene Muller ON 06/16/2013] calcium acetate (PHOSLO) capsule 667 mg  667 mg Oral TID WC Doree Albee, MD      . Melene Muller ON 06/16/2013] carvedilol (COREG) tablet 6.25 mg  6.25 mg Oral BID WC Doree Albee, MD      . clopidogrel (PLAVIX) tablet 75 mg  75 mg Oral Daily Doree Albee, MD      . furosemide (LASIX) tablet 40 mg  40 mg Oral BID Doree Albee, MD      . gabapentin (NEURONTIN) capsule 300 mg  300 mg Oral QHS Doree Albee, MD      . hydrALAZINE (APRESOLINE) tablet 25 mg  25 mg Oral 3 times per day Doree Albee, MD      . insulin aspart (novoLOG) injection 0-9 Units  0-9 Units Subcutaneous 6 times per day Doree Albee, MD      . insulin glargine (LANTUS) injection 30 Units  30 Units Subcutaneous Daily Doree Albee, MD      . isosorbide mononitrate (IMDUR) 24 hr tablet 60 mg  60 mg Oral Daily Doree Albee, MD      . Melene Muller ON 06/16/2013] levothyroxine (SYNTHROID, LEVOTHROID) tablet 75 mcg  75 mcg Oral QAC breakfast Doree Albee, MD      . multivitamin (RENA-VIT) tablet 1 tablet  1 tablet Oral QHS Doree Albee, MD      . nitroGLYCERIN (NITROSTAT) SL tablet 0.4 mg  0.4 mg Sublingual Q5 min PRN Doree Albee, MD      . nortriptyline (PAMELOR) capsule 50 mg  50 mg Oral Daily Doree Albee, MD      . Melene Muller ON 06/16/2013] simvastatin (ZOCOR) tablet 20 mg  20 mg Oral q1800 Doree Albee, MD      . travoprost (benzalkonium) (TRAVATAN) 0.004 % ophthalmic solution 1 drop  1 drop Both Eyes QHS  Doree Albee, MD       Current Outpatient Prescriptions  Medication Sig Dispense Refill  . acetaminophen (TYLENOL) 325 MG tablet Take 2 tablets (650 mg total) by mouth every 4 (four) hours as needed.      Marland Kitchen allopurinol (ZYLOPRIM) 100 MG tablet Take 100 mg by mouth  daily.       Marland Kitchen amLODipine (NORVASC) 10 MG tablet Take 5 mg by mouth daily.       Marland Kitchen aspirin EC 81 MG tablet Take 81 mg by mouth daily.      . calcitRIOL (ROCALTROL) 0.25 MCG capsule Take 0.25 mcg by mouth daily.       . calcium acetate (PHOSLO) 667 MG capsule Take 1 capsule (667 mg total) by mouth 3 (three) times daily with meals.      . carvedilol (COREG) 12.5 MG tablet Take 0.5 tablets (6.25 mg total) by mouth 2 (two) times daily with a meal.  30 tablet  0  . clopidogrel (PLAVIX) 75 MG tablet Take 75 mg by mouth daily.      . furosemide (LASIX) 80 MG tablet Take 1 tablet (80 mg total) by mouth 2 (two) times daily.  60 tablet  6  . gabapentin (NEURONTIN) 300 MG capsule Take 1 capsule (300 mg total) by mouth at bedtime.      . hydrALAZINE (APRESOLINE) 25 MG tablet Take 1 tablet (25 mg total) by mouth every 8 (eight) hours.      . insulin glargine (LANTUS) 100 UNIT/ML injection Inject 36 Units into the skin daily.      . isosorbide mononitrate (IMDUR) 60 MG 24 hr tablet Take 60 mg by mouth daily.       Marland Kitchen levothyroxine (SYNTHROID, LEVOTHROID) 75 MCG tablet Take 75 mcg by mouth daily before breakfast.      . multivitamin (RENA-VIT) TABS tablet Take 1 tablet by mouth at bedtime.    0  . nitroGLYCERIN (NITROSTAT) 0.4 MG SL tablet Place 0.4 mg under the tongue every 5 (five) minutes as needed for chest pain.       . nortriptyline (PAMELOR) 50 MG capsule Take 50 mg by mouth daily.       . ONGLYZA 5 MG TABS tablet Take 5 mg by mouth daily.       . polyethylene glycol (MIRALAX / GLYCOLAX) packet Take 17 g by mouth daily.  14 each  0  . pravastatin (PRAVACHOL) 40 MG tablet Take 40 mg by mouth daily.      . travoprost, benzalkonium, (TRAVATAN) 0.004 % ophthalmic solution Place 1 drop into both eyes at bedtime.        Review Of Systems: 12 point ROS negative except as noted above in HPI.  Physical Exam: Filed Vitals:   06/15/13 1759  BP:   Pulse:   Temp: 101.4 F (38.6 C)  Resp:     General:  mildly ill appearing, responsive, oriented x 4  HEENT: PERRLA, extra ocular movement intact and dry oral muocas  Heart: S1, S2 normal, no murmur, rub or gallop, regular rate and rhythm Lungs: clear to auscultation, no wheezes or rales and unlabored breathing Abdomen: abdomen is soft without significant tenderness, masses, organomegaly or guarding Extremities: L BKA, 2+ peripheral pulses in R no edema Skin:no rashes, no ecchymoses Neurology: normal without focal findings  Labs and Imaging: Lab Results  Component Value Date/Time   NA 137 06/15/2013  2:59 PM   K 4.6 06/15/2013  2:59 PM   CL 93* 06/15/2013  2:59 PM   CO2 21 06/15/2013  2:59 PM   BUN 62* 06/15/2013  2:59 PM   CREATININE 2.56* 06/15/2013  2:59 PM   CREATININE 2.51* 06/10/2013  4:45 PM   GLUCOSE 194* 06/15/2013  2:59 PM   Lab Results  Component Value Date   WBC 8.8 06/15/2013   HGB 14.0 06/15/2013   HCT 41.9 06/15/2013   MCV 85.5 06/15/2013   PLT 456* 06/15/2013   Urinalysis    Component Value Date/Time   COLORURINE YELLOW 06/15/2013 1507   APPEARANCEUR CLEAR 06/15/2013 1507   LABSPEC 1.017 06/15/2013 1507   PHURINE 5.0 06/15/2013 1507   GLUCOSEU NEGATIVE 06/15/2013 1507   HGBUR NEGATIVE 06/15/2013 1507   BILIRUBINUR NEGATIVE 06/15/2013 1507   KETONESUR NEGATIVE 06/15/2013 1507   PROTEINUR 100* 06/15/2013 1507   UROBILINOGEN 0.2 06/15/2013 1507   NITRITE NEGATIVE 06/15/2013 1507   LEUKOCYTESUR NEGATIVE 06/15/2013 1507       Dg Chest 2 View  06/15/2013   CLINICAL DATA:  Fatigue. Fever. Shortness of breath. Hypertension. Cough.  EXAM: CHEST  2 VIEW  COMPARISON:  DG CHEST 1V PORT dated 01/06/2013; DG CHEST 1V PORT dated 01/04/2013; DG CHEST 1V PORT dated 02/06/2012  FINDINGS: Tortuous and atherosclerotic aortic arch. Heart size within normal limits.  Mild scarring peripherally at the left lung base. Mild central airway thickening. No pleural effusion.  IMPRESSION: 1. Airway thickening is present, suggesting bronchitis or reactive  airways disease. 2. Mild scarring at the left lung base. 3. Atherosclerotic aortic arch.   Electronically Signed   By: Herbie Baltimore M.D.   On: 06/15/2013 14:26   Ct Head Wo Contrast  06/15/2013   CLINICAL DATA:  80 year old female with fever, shortness of breath, fatigue. Initial encounter.  EXAM: CT HEAD WITHOUT CONTRAST  TECHNIQUE: Contiguous axial images were obtained from the base of the skull through the vertex without intravenous contrast.  COMPARISON:  Head CT without contrast 07/26/2011. Brain MRI 08/04/2004.  FINDINGS: No acute orbit or scalp soft tissue findings. No acute osseous abnormality identified. Stable minimal paranasal sinus mucosal thickening. Stable minor chronic left mastoid fluid.  Calcified atherosclerosis at the skull base. Cerebral volume is not significantly changed since 2013. Stable ventricle size and configuration. Chronic patchy cerebral white matter hypodensity not significantly changed. Multiple chronic lacunar infarcts in the deep gray matter, most notably the left thalamus. No midline shift, mass effect, or evidence of intracranial mass lesion. No acute intracranial hemorrhage identified. No evidence of cortically based acute infarction identified. No suspicious intracranial vascular hyperdensity.  IMPRESSION: No acute intracranial abnormality.  Chronic small vessel disease.   Electronically Signed   By: Augusto Gamble M.D.   On: 06/15/2013 16:48     Assessment and Plan: [name] is a 80 y.o. year old female presenting with encephalopathy, fever   Encephalopathy: Likely multifactorial contributions of dehydration secondary to overdiuresis, questionable infectious process,? Mild uremia. Gently hydrate patient as patient has a fairly narrow volume balance. Half Lasix dose. Urinalysis and chest x-ray are negative for any focal infection thus far. However, suspect the patient may have mild element of pneumonia as well as been reported upper respiratory symptoms over the  past 2-3 weeks. We'll place on vancomycin and Zosyn for broad-spectrum coverage. Repeat chest x-ray in the morning after hydration. Blood and urine cultures. May also need towith cardiology and nephrology.  CHF/CAD: Clinically dry on exam. Gentle hydration. No active chest pain currently. We'll continue outpatient regimen apart from her home Lasix dose.  Titrate blood pressure regimen as needed.  Chronic kidney disease: Stage V clinically. Still making trace amount of urine. We'll follow urine output with hydration. May benefit from inpatient renal consult as patient failed to followup outpatient from last hospitalization.  Insulin-dependent diabetes: Sliding scale insulin. A1c.  FEN/GI: heart healthy, carb modified diet  Prophylaxis: sub q heparin  Disposition: pending further evaluation.   Code Status: Full Code        Doree AlbeeSteven Iren Whipp MD  Pager: [phone]

## 2013-06-15 NOTE — ED Notes (Signed)
Radiology notified of Xray pending on pt.

## 2013-06-15 NOTE — ED Notes (Signed)
Spoke with Rosey Bath with the IV team and she will come and assess for IV access.

## 2013-06-15 NOTE — ED Notes (Addendum)
Spoke with Rosey Bath with the IV team

## 2013-06-15 NOTE — ED Notes (Signed)
Report called to Marily Memos, RN Melvenia Needles

## 2013-06-15 NOTE — ED Provider Notes (Signed)
CSN: [id number]     Arrival date & time 06/15/13  1327 History   First MD Initiated Contact with Patient 06/15/13 1337     Chief Complaint  Patient presents with  . Fatigue  . Fever  . Shortness of Breath     (Consider location/radiation/quality/duration/timing/severity/associated sxs/prior Treatment) The history is provided by the patient. No language interpreter was used.  Level 5 Caveat   [name] is an 80 year old female with past medical history of coronary artery disease, TIA, hypertension, unstable angina, diabetes, chronic kidney disease stage IV no dialysis, CHF, nonischemic cardio myopathy, cardiac catheterization in 2009, AV fistula placement, indication of left lower extremity-below-the-knee in 2013 presenting to the ED with daughter and son who are concerned regarding patient's condition. Most of the history is from the children - reported that mother has been coughing mainly dry. Reported that she has been having intermittent fevers throughout the week, but unable to recall how high the temperatures were. Reported that patient has not been eating or drinking as often for the past couple of days. Reported that patient has been extremely weak and fatigued. As per family, reported that patient woke up confused this morning. Reported that she did not know what was going on and where she was - reported that she is back at baseline. Patient denied abdominal pain, nausea, vomiting, diarrhea, sore throat, fevers.  PCP Dr. Nicholos Johns Cardiologist Dr. Rennis Golden Nephrology Dr. Darrick Penna  Past Medical History  Diagnosis Date  . Coronary artery disease   . TIA (transient ischemic attack)   . Hypertension   . Shingles   . Stroke   . Anemia   . CAD (coronary artery disease) 02/08/2011  . Cardiomyopathy, idiopathic 02/08/2011  . Chronic kidney disease   . Angina     Takes Isosorbide  . Unstable angina 02/08/2011  . Myocardial infarct     x 3 unsure of years  . Irregular  heartbeat   . Ulcer   . GERD (gastroesophageal reflux disease)     takes Protonix and zantac  . Hx of transient ischemic attack (TIA)   . Memory loss   . CHF (congestive heart failure)     Takes Lasix  . Gout     takes allopurinol  . Peripheral vascular disease     left lower  leg  . Diabetes mellitus     type 2 NIDDM x 2 years; no meds  . Chronic kidney disease (CKD), stage IV (severe)   . Arthritis   . Shortness of breath   . Thyroid disease   . Hypothyroidism     (SEVERE) Takes Levothryroxine  . Hypothyroid 02/08/2011  . Nonischemic cardiomyopathy     EF now is 55%, reduced due to myxedema, which is improved.  . Dyslipidemia   . Peripheral neuropathy   . H/O echocardiogram 09/06/11    Indication- nonIschemic Cardiomyopathy. EF = now greater than 55% with no regional wall motion abnormalities. Tthere is mild to moderate trisuspid regurgitayion and mild pulmonary hypertension with an RVSP of 35 mmHg as well as stage 1 diastolic dysfunction and mild to moderate LVH.  Marland Kitchen Abnormal nuclear stress test 06/01/09    Demonstrated a new area of infarct scar, peri-infarct ischemia seen in the inferolateral territory. EF eas normal at 70% with mild hypocontractility at the apex, distal inferolateral wall.   Past Surgical History  Procedure Laterality Date  . Back surgery      [facility]  . Eye surgery  Left eye surgery; cataract removal  . Left bka  07/05/2011  . Av fistula placement    . Amputation  07/05/2011    Procedure: AMPUTATION BELOW KNEE;  Surgeon: Chuck Hint, MD;  Location: [facility] OR;  Service: Vascular;  Laterality: Left;  . Angioplasty  1988  . Cardiac catheterization  09/28/07    Demonstrated multiple sequential lesions around 40 to 30% in the RCA territory.  . Left lower extremity venous duplex Left 06/27/11    Summary: No evidence of DVT involving the left lower extremity and right common femoral vein.   . Lower extremity arterial evaluation  06/27/11     SUMMARY: Right: ABI not ascertained due to false elevation in BP secondary to calcification (posterior tibial artery is non compressible). Left: ABI indicates moderate reduction in arterial flow. Bilateral: Great toe PPG waveforms indicate adequate perfusion. Great toe pressures not obtained due to patient's movements secondary to pain.  . Duplex doppler  05/10/11    LE arterial dopplers demonstrate bilaterally reduced ABIs of 0.91 on right & 0.56 on left. She does report some decreased pain on the left, & there's moderate mixed-density plaque in the right SFA w/50 to 69% reduction. There's a 69% reduction in the left SFA & does appear to be occlusive disease of left posterior tibial artery. Right posterior dorsalis pedis artery demonstrates occlusive disease  . Insertion of dialysis catheter N/A 01/06/2013    Procedure: INSERTION OF DIALYSIS CATHETER;  Surgeon: Larina Earthly, MD;  Location: [facility] OR;  Service: Vascular;  Laterality: N/A;   Family History  Problem Relation Age of Onset  . Cancer Sister     STOMACH  . Diabetes Sister   . Cancer Brother     BONE  . Diabetes Brother   . Anesthesia problems Neg Hx   . Hypotension Neg Hx   . Malignant hyperthermia Neg Hx   . Pseudochol deficiency Neg Hx   . Hyperlipidemia Daughter   . Hypertension Daughter   . Heart disease Daughter     before age 56  . Kidney disease Daughter   . Heart attack Daughter   . Other Daughter     varicose veins  . Diabetes Daughter   . Heart disease Son     before age 52  . Hyperlipidemia Son   . Hypertension Son   . Heart attack Son    History  Substance Use Topics  . Smoking status: Former Smoker -- 0.50 packs/day for 40 years    Quit date: 07/05/1986  . Smokeless tobacco: Never Used     Comment: quit smoking 1988  . Alcohol Use: No   OB History   Grav Para Term Preterm Abortions TAB SAB Ect Mult Living                 Review of Systems  Unable to perform ROS: Mental status change  Constitutional:  Positive for fever. Negative for chills.  Respiratory: Positive for cough and shortness of breath. Negative for chest tightness.   Cardiovascular: Negative for chest pain.  Gastrointestinal: Negative for nausea, vomiting and abdominal pain.  Psychiatric/Behavioral: Positive for confusion.  All other systems reviewed and are negative.  Level 5 caveat     Allergies  Aspirin  Home Medications   Current Outpatient Rx  Name  Route  Sig  Dispense  Refill  . acetaminophen (TYLENOL) 325 MG tablet   Oral   Take 2 tablets (650 mg total) by mouth every 4 (four) hours as  needed.         Marland Kitchen allopurinol (ZYLOPRIM) 100 MG tablet   Oral   Take 100 mg by mouth daily.          Marland Kitchen amLODipine (NORVASC) 10 MG tablet   Oral   Take 5 mg by mouth daily.          Marland Kitchen aspirin EC 81 MG tablet   Oral   Take 81 mg by mouth daily.         . calcitRIOL (ROCALTROL) 0.25 MCG capsule   Oral   Take 0.25 mcg by mouth daily.          . calcium acetate (PHOSLO) 667 MG capsule   Oral   Take 1 capsule (667 mg total) by mouth 3 (three) times daily with meals.         . carvedilol (COREG) 12.5 MG tablet   Oral   Take 0.5 tablets (6.25 mg total) by mouth 2 (two) times daily with a meal.   30 tablet   0   . clopidogrel (PLAVIX) 75 MG tablet   Oral   Take 75 mg by mouth daily.         . furosemide (LASIX) 80 MG tablet   Oral   Take 1 tablet (80 mg total) by mouth 2 (two) times daily.   60 tablet   6   . gabapentin (NEURONTIN) 300 MG capsule   Oral   Take 1 capsule (300 mg total) by mouth at bedtime.         . hydrALAZINE (APRESOLINE) 25 MG tablet   Oral   Take 1 tablet (25 mg total) by mouth every 8 (eight) hours.         . insulin glargine (LANTUS) 100 UNIT/ML injection   Subcutaneous   Inject 36 Units into the skin daily.         . isosorbide mononitrate (IMDUR) 60 MG 24 hr tablet   Oral   Take 60 mg by mouth daily.          Marland Kitchen levothyroxine (SYNTHROID, LEVOTHROID) 75  MCG tablet   Oral   Take 75 mcg by mouth daily before breakfast.         . multivitamin (RENA-VIT) TABS tablet   Oral   Take 1 tablet by mouth at bedtime.      0   . nitroGLYCERIN (NITROSTAT) 0.4 MG SL tablet   Sublingual   Place 0.4 mg under the tongue every 5 (five) minutes as needed for chest pain.          . nortriptyline (PAMELOR) 50 MG capsule   Oral   Take 50 mg by mouth daily.          . ONGLYZA 5 MG TABS tablet   Oral   Take 5 mg by mouth daily.          . polyethylene glycol (MIRALAX / GLYCOLAX) packet   Oral   Take 17 g by mouth daily.   14 each   0   . pravastatin (PRAVACHOL) 40 MG tablet   Oral   Take 40 mg by mouth daily.         . travoprost, benzalkonium, (TRAVATAN) 0.004 % ophthalmic solution   Both Eyes   Place 1 drop into both eyes at bedtime.           BP 171/83  Pulse 90  Temp(Src) 101.4 F (38.6 C) (Rectal)  Resp 19  Ht 4' 9.09" (1.45  m)  Wt 128 lb 1.4 oz (58.1 kg)  BMI 27.63 kg/m2  SpO2 97% Physical Exam  Nursing note and vitals reviewed. Constitutional: She is oriented to person, place, and time. She appears well-nourished. No distress.  HENT:  Head: Normocephalic and atraumatic.  Eyes: Conjunctivae and EOM are normal. Pupils are equal, round, and reactive to light. Right eye exhibits no discharge. Left eye exhibits no discharge.  Negative nystagmus Visual fields grossly intact  Neck: Normal range of motion. No tracheal deviation present.  Cardiovascular: Normal rate, regular rhythm and normal heart sounds.  Exam reveals no friction rub.   No murmur heard. Pulses:      Radial pulses are 2+ on the right side, and 2+ on the left side.       Dorsalis pedis pulses are 2+ on the right side.  Pulmonary/Chest: Effort normal. She has no wheezes. She has no rales.  Mildly labored breathing noted Patient is able to speak in full sentences without difficulty Negative stridor  Abdominal: Soft. Bowel sounds are normal. There is no  tenderness. There is no guarding.  Musculoskeletal: Normal range of motion.  Left BTK amputation noted.   Lymphadenopathy:    She has no cervical adenopathy.  Neurological: She is alert and oriented to person, place, and time. No cranial nerve deficit. She exhibits normal muscle tone. Coordination normal.  Cranial nerves III-XII grossly intact Strength 5+/5+ to upper and lower extremities bilaterally with resistance applied, equal distribution noted Equal grip strength Sensation intact Negative slurred speech Negative facial drooping Patient knows where she is, who she is, year   Skin: Skin is dry. No rash noted. She is not diaphoretic. No erythema.  Psychiatric: She has a normal mood and affect. Her behavior is normal. Thought content normal.    ED Course  Procedures (including critical care time)  4:56 PM Attending physician saw and assessed the patient. Recommended admission.   6:27 PM This provider spoke with Dr. Alvester Morin, [facility] - discussed case, history, presentation, labs, imaging in great detail. Physician reported that he is going to assess the patient.   Results for orders placed during the hospital encounter of 06/15/13  CBC WITH DIFFERENTIAL      Result Value Ref Range   WBC 8.8  4.0 - 10.5 K/uL   RBC 4.90  3.87 - 5.11 MIL/uL   Hemoglobin 14.0  12.0 - 15.0 g/dL   HCT 74.2  59.5 - 63.8 %   MCV 85.5  78.0 - 100.0 fL   MCH 28.6  26.0 - 34.0 pg   MCHC 33.4  30.0 - 36.0 g/dL   RDW 75.6  43.3 - 29.5 %   Platelets 456 (*) 150 - 400 K/uL   Neutrophils Relative % 71  43 - 77 %   Neutro Abs 6.3  1.7 - 7.7 K/uL   Lymphocytes Relative 17  12 - 46 %   Lymphs Abs 1.5  0.7 - 4.0 K/uL   Monocytes Relative 12  3 - 12 %   Monocytes Absolute 1.0  0.1 - 1.0 K/uL   Eosinophils Relative 0  0 - 5 %   Eosinophils Absolute 0.0  0.0 - 0.7 K/uL   Basophils Relative 1  0 - 1 %   Basophils Absolute 0.0  0.0 - 0.1 K/uL  COMPREHENSIVE METABOLIC PANEL      Result Value Ref Range    Sodium 137  137 - 147 mEq/L   Potassium 4.6  3.7 - 5.3 mEq/L  Chloride 93 (*) 96 - 112 mEq/L   CO2 21  19 - 32 mEq/L   Glucose, Bld 194 (*) 70 - 99 mg/dL   BUN 62 (*) 6 - 23 mg/dL   Creatinine, Ser 4.09 (*) 0.50 - 1.10 mg/dL   Calcium 81.1  8.4 - 91.4 mg/dL   Total Protein 8.4 (*) 6.0 - 8.3 g/dL   Albumin 3.8  3.5 - 5.2 g/dL   AST 26  0 - 37 U/L   ALT 13  0 - 35 U/L   Alkaline Phosphatase 96  39 - 117 U/L   Total Bilirubin 0.5  0.3 - 1.2 mg/dL   GFR calc non Af Amer 16 (*) >90 mL/min   GFR calc Af Amer 18 (*) >90 mL/min  TROPONIN I      Result Value Ref Range   Troponin I <0.30  <0.30 ng/mL  URINALYSIS, ROUTINE W REFLEX MICROSCOPIC      Result Value Ref Range   Color, Urine YELLOW  YELLOW   APPearance CLEAR  CLEAR   Specific Gravity, Urine 1.017  1.005 - 1.030   pH 5.0  5.0 - 8.0   Glucose, UA NEGATIVE  NEGATIVE mg/dL   Hgb urine dipstick NEGATIVE  NEGATIVE   Bilirubin Urine NEGATIVE  NEGATIVE   Ketones, ur NEGATIVE  NEGATIVE mg/dL   Protein, ur 782 (*) NEGATIVE mg/dL   Urobilinogen, UA 0.2  0.0 - 1.0 mg/dL   Nitrite NEGATIVE  NEGATIVE   Leukocytes, UA NEGATIVE  NEGATIVE  URINE MICROSCOPIC-ADD ON      Result Value Ref Range   Squamous Epithelial / LPF RARE  RARE   WBC, UA 0-2  <3 WBC/hpf   RBC / HPF 0-2  <3 RBC/hpf   Bacteria, UA RARE  RARE    Labs Review Labs Reviewed  CBC WITH DIFFERENTIAL - Abnormal; Notable for the following:    Platelets 456 (*)    All other components within normal limits  COMPREHENSIVE METABOLIC PANEL - Abnormal; Notable for the following:    Chloride 93 (*)    Glucose, Bld 194 (*)    BUN 62 (*)    Creatinine, Ser 2.56 (*)    Total Protein 8.4 (*)    GFR calc non Af Amer 16 (*)    GFR calc Af Amer 18 (*)    All other components within normal limits  URINALYSIS, ROUTINE W REFLEX MICROSCOPIC - Abnormal; Notable for the following:    Protein, ur 100 (*)    All other components within normal limits  CULTURE, BLOOD (ROUTINE X 2)   CULTURE, BLOOD (ROUTINE X 2)  URINE CULTURE  TROPONIN I  URINE MICROSCOPIC-ADD ON  HEMOGLOBIN A1C   Imaging Review Dg Chest 2 View  06/15/2013   CLINICAL DATA:  Fatigue. Fever. Shortness of breath. Hypertension. Cough.  EXAM: CHEST  2 VIEW  COMPARISON:  DG CHEST 1V PORT dated 01/06/2013; DG CHEST 1V PORT dated 01/04/2013; DG CHEST 1V PORT dated 02/06/2012  FINDINGS: Tortuous and atherosclerotic aortic arch. Heart size within normal limits.  Mild scarring peripherally at the left lung base. Mild central airway thickening. No pleural effusion.  IMPRESSION: 1. Airway thickening is present, suggesting bronchitis or reactive airways disease. 2. Mild scarring at the left lung base. 3. Atherosclerotic aortic arch.   Electronically Signed   By: Herbie Baltimore M.D.   On: 06/15/2013 14:26   Ct Head Wo Contrast  06/15/2013   CLINICAL DATA:  80 year old female with fever, shortness of  breath, fatigue. Initial encounter.  EXAM: CT HEAD WITHOUT CONTRAST  TECHNIQUE: Contiguous axial images were obtained from the base of the skull through the vertex without intravenous contrast.  COMPARISON:  Head CT without contrast 07/26/2011. Brain MRI 08/04/2004.  FINDINGS: No acute orbit or scalp soft tissue findings. No acute osseous abnormality identified. Stable minimal paranasal sinus mucosal thickening. Stable minor chronic left mastoid fluid.  Calcified atherosclerosis at the skull base. Cerebral volume is not significantly changed since 2013. Stable ventricle size and configuration. Chronic patchy cerebral white matter hypodensity not significantly changed. Multiple chronic lacunar infarcts in the deep gray matter, most notably the left thalamus. No midline shift, mass effect, or evidence of intracranial mass lesion. No acute intracranial hemorrhage identified. No evidence of cortically based acute infarction identified. No suspicious intracranial vascular hyperdensity.  IMPRESSION: No acute intracranial abnormality.   Chronic small vessel disease.   Electronically Signed   By: Augusto GambleLee  Hall M.D.   On: 06/15/2013 16:48     EKG Interpretation   Date/Time:  Saturday June 15 2013 13:33:48 EDT Ventricular Rate:  98 PR Interval:    QRS Duration: 102 QT Interval:  449 QTC Calculation: 573 R Axis:   73 Text Interpretation:  Accelerated junctional rhythm Anteroseptal infarct,  age indeterminate Prolonged QT interval Junctional Rhythm noted 01-06-2013  Confirmed by Fayrene FearingJAMES  MD, MARK ([id number]) on 06/15/2013 2:36:23 PM      MDM   Final diagnoses:  Altered mental status  Bronchitis  Fever  Lethargic  CHF (congestive heart failure)  CKD (chronic kidney disease)   Medications  acetaminophen (TYLENOL) tablet 650 mg (not administered)  allopurinol (ZYLOPRIM) tablet 100 mg (not administered)  amLODipine (NORVASC) tablet 5 mg (not administered)  aspirin EC tablet 81 mg (not administered)  calcitRIOL (ROCALTROL) capsule 0.25 mcg (not administered)  calcium acetate (PHOSLO) capsule 667 mg (not administered)  carvedilol (COREG) tablet 6.25 mg (not administered)  clopidogrel (PLAVIX) tablet 75 mg (not administered)  furosemide (LASIX) tablet 40 mg (not administered)  gabapentin (NEURONTIN) capsule 300 mg (not administered)  hydrALAZINE (APRESOLINE) tablet 25 mg (not administered)  isosorbide mononitrate (IMDUR) 24 hr tablet 60 mg (not administered)  levothyroxine (SYNTHROID, LEVOTHROID) tablet 75 mcg (not administered)  multivitamin (RENA-VIT) tablet 1 tablet (not administered)  nitroGLYCERIN (NITROSTAT) SL tablet 0.4 mg (not administered)  nortriptyline (PAMELOR) capsule 50 mg (not administered)  simvastatin (ZOCOR) tablet 20 mg (not administered)  travoprost (benzalkonium) (TRAVATAN) 0.004 % ophthalmic solution 1 drop (not administered)  insulin glargine (LANTUS) injection 30 Units (not administered)  insulin aspart (novoLOG) injection 0-9 Units (not administered)  0.9 %  sodium chloride infusion (not  administered)  sodium chloride 0.9 % bolus 500 mL (500 mLs Intravenous New Bag/Given 06/15/13 1758)  acetaminophen (TYLENOL) tablet 650 mg (650 mg Oral Given 06/15/13 1821)   Filed Vitals:   06/15/13 1745 06/15/13 1759 06/15/13 1800 06/15/13 1900  BP: 168/75  171/83   Pulse: 91  90   Temp:  101.4 F (38.6 C)    TempSrc:  Rectal    Resp: 14  19   Height:    4' 9.09" (1.45 m)  Weight:    128 lb 1.4 oz (58.1 kg)  SpO2: 95%  97%     Patient presenting to the ED with fever, labored breathing, cough, dehydration that started a couple of days ago. Patient presents with family who are the main historians. Family reported that patient has not been eating or drinking for the past couple of days and reported  that patient was confused this morning. Patient has a significant history - patient has chronic kidney disease stage IV not on dialysis, but AV fistula placed. Left BTK amputation in 2013 secondary to PVD.  This provider reviewed the patient's chart. Has a long history of CAD, CHF, CKD stage IV with recommendation to start dialysis, but has not. Patient has had increase in Lasix to 80 mg BID. Patient seen and assessed by Dr. Rennis Golden yesterday who reported no change to medication since patient is doing well with increased dose of medications.  Alert. GCS 15. Heart rate and rhythm normal. Lungs noted to be minimally diminished to upper and lower lobes bilaterally - negative rales or crackles noted. Negative decreased lung expansion. Patient has mild labored breathing noted - shortness of breath, cap refill < 3 seconds. Radial and DP 2+. Left BKA noted with negative infection or cellulitic findings identified.  EKG noted accelerated junctional rhythm with a heart rate of 90 beats per minute, prolonged QT interval identified. Troponin negative elevation. CBC negative elevation white blood cell count identified-negative left shift or leukocytosis noted. CMP noted elevated BUN and Cr - BUN 62, Cr 2.56 - mild  increase from 2.51 that was measured 6 days ago. Urine negative for infection-negative nitrites and leukocytes identified. CT head negative for acute intracranial abnormalities. Chest x-ray noted airway thickening is suggestive of bronchitis, mild scarring at the left lung base. Rectal temperature performed in ED setting the measured 101.59F. Blood cultures and urine cultures ordered. Patient IV fluids-controlled secondary to history of congestive heart failure and kidney issues. Patient was found to be altered this morning as per family. Will start patient on empiric antibiotics. Patient will be admitted to hospital. Patient admitted to Telemetry observation. Discussed plan for admission with patient and family at bedside - understood and agreed. Patient stable for transfer.   Raymon Mutton, PA-C 06/16/13 0020

## 2013-06-15 NOTE — ED Notes (Addendum)
Contacted IV team to assess for IV site.

## 2013-06-15 NOTE — Progress Notes (Signed)
ANTIBIOTIC CONSULT NOTE - INITIAL  Pharmacy Consult for Vancomycin + Zosyn Indication: Empiric coverage in the setting of fever and encephalopathy  Allergies  Allergen Reactions  . Aspirin Nausea And Vomiting    325 mg (adult strength) [name]     Patient Measurements: Height: 4' 9.09" (145 cm) Weight: 128 lb 1.4 oz (58.1 kg) IBW/kg (Calculated) : 38.8  Vital Signs: Temp: 101.4 F (38.6 C) (03/28 1759) Temp src: Rectal (03/28 1759) BP: 123/85 mmHg (03/28 1706) Pulse Rate: 92 (03/28 1706) Intake/Output from previous day:   Intake/Output from this shift:    Labs:  Recent Labs  06/15/13 1459  WBC 8.8  HGB 14.0  PLT 456*  CREATININE 2.56*   Estimated Creatinine Clearance: 11.2 ml/min (by C-G formula based on Cr of 2.56). No results found for this basename: VANCOTROUGH, VANCOPEAK, VANCORANDOM, GENTTROUGH, GENTPEAK, GENTRANDOM, TOBRATROUGH, TOBRAPEAK, TOBRARND, AMIKACINPEAK, AMIKACINTROU, AMIKACIN,  in the last 72 hours   Microbiology: No results found for this or any previous visit (from the past 720 hour(s)).  Medical History: Past Medical History  Diagnosis Date  . Coronary artery disease   . TIA (transient ischemic attack)   . Hypertension   . Shingles   . Stroke   . Anemia   . CAD (coronary artery disease) 02/08/2011  . Cardiomyopathy, idiopathic 02/08/2011  . Chronic kidney disease   . Angina     Takes Isosorbide  . Unstable angina 02/08/2011  . Myocardial infarct     x 3 unsure of years  . Irregular heartbeat   . Ulcer   . GERD (gastroesophageal reflux disease)     takes Protonix and zantac  . Hx of transient ischemic attack (TIA)   . Memory loss   . CHF (congestive heart failure)     Takes Lasix  . Gout     takes allopurinol  . Peripheral vascular disease     left lower  leg  . Diabetes mellitus     type 2 NIDDM x 2 years; no meds  . Chronic kidney disease (CKD), stage IV (severe)    . Arthritis   . Shortness of breath   . Thyroid disease   . Hypothyroidism     (SEVERE) Takes Levothryroxine  . Hypothyroid 02/08/2011  . Nonischemic cardiomyopathy     EF now is 55%, reduced due to myxedema, which is improved.  . Dyslipidemia   . Peripheral neuropathy   . H/O echocardiogram 09/06/11    Indication- nonIschemic Cardiomyopathy. EF = now greater than 55% with no regional wall motion abnormalities. Tthere is mild to moderate trisuspid regurgitayion and mild pulmonary hypertension with an RVSP of 35 mmHg as well as stage 1 diastolic dysfunction and mild to moderate LVH.  Marland Kitchen Abnormal nuclear stress test 06/01/09    Demonstrated a new area of infarct scar, peri-infarct ischemia seen in the inferolateral territory. EF eas normal at 70% with mild hypocontractility at the apex, distal inferolateral wall.    Assessment: 80 y.o. F who presented to the [facility] on [facility]15[facility]13 with ecephalopathy, weakness, dehydration, and SOB. Pharmacy was consulted to start empiric coverage in the setting of encephalopathy and fevers (Temp 101.4). Wt: 58.1 kg, SCr 2.56, CrCl~10-15.  ml/min.   Goal of Therapy:  Vancomycin trough level 15-20 mcg/ml  Plan:  1. Vancomycin 750 mg IV every 48 hours 2. Zosyn 2.25g IV every 8 hours 3. Will continue to follow renal function, culture results, LOT, and antibiotic  de-escalation plans   Georgina PillionElizabeth Koben Daman, PharmD, BCPS Clinical Pharmacist Pager: [phone] 06/15/2013 7:13 PM

## 2013-06-16 ENCOUNTER — Other Ambulatory Visit: Payer: Self-pay

## 2013-06-16 ENCOUNTER — Encounter (HOSPITAL_COMMUNITY): Payer: Self-pay | Admitting: *Deleted

## 2013-06-16 DIAGNOSIS — I798 Other disorders of arteries, arterioles and capillaries in diseases classified elsewhere: Secondary | ICD-10-CM

## 2013-06-16 DIAGNOSIS — I509 Heart failure, unspecified: Secondary | ICD-10-CM

## 2013-06-16 DIAGNOSIS — N184 Chronic kidney disease, stage 4 (severe): Secondary | ICD-10-CM

## 2013-06-16 DIAGNOSIS — E1159 Type 2 diabetes mellitus with other circulatory complications: Secondary | ICD-10-CM

## 2013-06-16 DIAGNOSIS — I2510 Atherosclerotic heart disease of native coronary artery without angina pectoris: Secondary | ICD-10-CM

## 2013-06-16 LAB — COMPREHENSIVE METABOLIC PANEL
ALBUMIN: 3 g/dL — AB (ref 3.5–5.2)
ALT: 12 U/L (ref 0–35)
AST: 24 U/L (ref 0–37)
Alkaline Phosphatase: 73 U/L (ref 39–117)
BUN: 60 mg/dL — AB (ref 6–23)
CO2: 21 mEq/L (ref 19–32)
Calcium: 8.6 mg/dL (ref 8.4–10.5)
Chloride: 96 mEq/L (ref 96–112)
Creatinine, Ser: 2.48 mg/dL — ABNORMAL HIGH (ref 0.50–1.10)
GFR calc Af Amer: 19 mL/min — ABNORMAL LOW (ref >90)
GFR calc non Af Amer: 16 mL/min — ABNORMAL LOW (ref >90)
Glucose, Bld: 157 mg/dL — ABNORMAL HIGH (ref 70–99)
POTASSIUM: 3.6 meq/L — AB (ref 3.7–5.3)
Sodium: 137 mEq/L (ref 137–147)
TOTAL PROTEIN: 6.7 g/dL (ref 6.0–8.3)
Total Bilirubin: 0.6 mg/dL (ref 0.3–1.2)

## 2013-06-16 LAB — CBC WITH DIFFERENTIAL/PLATELET
BASOS ABS: 0 10*3/uL (ref 0.0–0.1)
BASOS PCT: 1 % (ref 0–1)
EOS ABS: 0.2 10*3/uL (ref 0.0–0.7)
Eosinophils Relative: 3 % (ref 0–5)
HCT: 34.8 % — ABNORMAL LOW (ref 36.0–46.0)
HEMOGLOBIN: 11.3 g/dL — AB (ref 12.0–15.0)
Lymphocytes Relative: 25 % (ref 12–46)
Lymphs Abs: 1.6 10*3/uL (ref 0.7–4.0)
MCH: 28.1 pg (ref 26.0–34.0)
MCHC: 32.5 g/dL (ref 30.0–36.0)
MCV: 86.6 fL (ref 78.0–100.0)
MONOS PCT: 17 % — AB (ref 3–12)
Monocytes Absolute: 1.1 10*3/uL — ABNORMAL HIGH (ref 0.1–1.0)
NEUTROS ABS: 3.4 10*3/uL (ref 1.7–7.7)
Neutrophils Relative %: 54 % (ref 43–77)
Platelets: 346 10*3/uL (ref 150–400)
RBC: 4.02 MIL/uL (ref 3.87–5.11)
RDW: 15.2 % (ref 11.5–15.5)
WBC: 6.2 10*3/uL (ref 4.0–10.5)

## 2013-06-16 LAB — RENAL FUNCTION PANEL
Albumin: 2.9 g/dL — ABNORMAL LOW (ref 3.5–5.2)
BUN: 58 mg/dL — AB (ref 6–23)
CO2: 18 meq/L — AB (ref 19–32)
Calcium: 8.4 mg/dL (ref 8.4–10.5)
Chloride: 96 mEq/L (ref 96–112)
Creatinine, Ser: 2.34 mg/dL — ABNORMAL HIGH (ref 0.50–1.10)
GFR calc non Af Amer: 17 mL/min — ABNORMAL LOW (ref >90)
GFR, EST AFRICAN AMERICAN: 20 mL/min — AB (ref >90)
GLUCOSE: 173 mg/dL — AB (ref 70–99)
POTASSIUM: 4.9 meq/L (ref 3.7–5.3)
Phosphorus: 4.2 mg/dL (ref 2.3–4.6)
SODIUM: 133 meq/L — AB (ref 137–147)

## 2013-06-16 LAB — GLUCOSE, CAPILLARY
GLUCOSE-CAPILLARY: 130 mg/dL — AB (ref 70–99)
GLUCOSE-CAPILLARY: 170 mg/dL — AB (ref 70–99)
Glucose-Capillary: 166 mg/dL — ABNORMAL HIGH (ref 70–99)
Glucose-Capillary: 77 mg/dL (ref 70–99)
Glucose-Capillary: 98 mg/dL (ref 70–99)

## 2013-06-16 LAB — HEMOGLOBIN A1C
HEMOGLOBIN A1C: 8.6 % — AB (ref <5.7)
MEAN PLASMA GLUCOSE: 200 mg/dL — AB (ref <117)

## 2013-06-16 NOTE — Progress Notes (Addendum)
[facility]                                                                              Patient Demographics  [name], is a 80 y.o. female, DOB - [birth year], ZOX:[id number]  Admit date - 06/15/2013   Admitting Physician Doree Albee, MD  Outpatient Primary MD for the patient is Georgianne Fick, MD  LOS - 1   Chief Complaint  Patient presents with  . Fatigue  . Fever  . Shortness of Breath        Assessment & Plan   Acute encephalopathy -Multifactorial including dehydration secondary to over diuresis, questionable mild uremia, questionable infectious cough -CT of the head was negative -UA negative for infection, chest x-ray negative for any cardiopulmonary process -Patient appears to be back to her baseline -Continue to have cough, questionable pneumonia, continue vancomycin and Zosyn -Blood cultures pending  Chronic Congestive heart failure, likely diastolic -Currently not in exacerbation appears to be euvolemic -Was found to be dry on initial examination during admission -Will continue Lasix, at half dose -Continue daily weights, monitoring of intake and output -Last echocardiogram 01/05/2013 shows an EF of 65-70%, however, he was not sufficient to allow evaluation of LV diastolic function  Coronary artery disease -Stable, patient is chest pain-free  Chronic kidney disease stage V -Will continue to monitor her urine output -May consider nephrology consult as patient failed to follow up with nephrology outpatient  Diabetes mellitus -Continue on sliding scale and CBG monitoring -Hemoglobin A1c 11.3 -Will need outpatient followup and management  Code Status: Full  Family Communication: None at bedside  Disposition Plan: Admitted and will likely discharge if stable 06/17/2013  Time Spent in minutes   30 minutes  Procedures none  Consults  none  DVT Prophylaxis  Heparin   Lab Results  Component Value Date   PLT 346 06/16/2013     Medications  Scheduled Meds: . allopurinol  100 mg Oral Daily  . amLODipine  5 mg Oral Daily  . aspirin EC  81 mg Oral Daily  . calcitRIOL  0.25 mcg Oral Daily  . calcium acetate  667 mg Oral TID WC  . carvedilol  6.25 mg Oral BID WC  . clopidogrel  75 mg Oral Daily  . furosemide  40 mg Oral BID  . gabapentin  300 mg Oral QHS  . heparin  5,000 Units Subcutaneous 3 times per day  . hydrALAZINE  25 mg Oral 3 times per day  . insulin aspart  0-9 Units Subcutaneous 6 times per day  . insulin glargine  30 Units Subcutaneous Daily  . isosorbide mononitrate  60 mg Oral Daily  . levothyroxine  75 mcg Oral QAC breakfast  . multivitamin  1 tablet Oral QHS  . nortriptyline  50 mg Oral Daily  . piperacillin-tazobactam (ZOSYN)  IV  2.25 g Intravenous 3 times per day  . simvastatin  20 mg Oral q1800  . Travoprost (BAK Free)  1 drop Both Eyes QHS  . [START ON 06/17/2013] vancomycin  750 mg Intravenous Q48H   Continuous Infusions: . sodium chloride 75 mL/hr at 06/16/13 0432   PRN Meds:.acetaminophen, nitroGLYCERIN  Antibiotics    Anti-infectives  Start     Dose/Rate Route Frequency Ordered Stop   06/17/13 2000  vancomycin (VANCOCIN) IVPB 750 mg/150 ml premix     750 mg 150 mL/hr over 60 Minutes Intravenous Every 48 hours 06/15/13 1913     06/16/13 0600  piperacillin-tazobactam (ZOSYN) IVPB 2.25 g     2.25 g 100 mL/hr over 30 Minutes Intravenous 3 times per day 06/15/13 1913     06/15/13 1915  vancomycin (VANCOCIN) IVPB 750 mg/150 ml premix     750 mg 150 mL/hr over 60 Minutes Intravenous  Once 06/15/13 1913 06/15/13 2218   06/15/13 1915  piperacillin-tazobactam (ZOSYN) IVPB 3.375 g     3.375 g 100 mL/hr over 30 Minutes Intravenous  Once 06/15/13 1913 06/15/13 2101      Subjective:   [name] seen and examined today.  Patient has no complaints this morning.  She believes she has a cold.    Objective:   Filed Vitals:   06/15/13 1930 06/15/13 1959 06/15/13 2045  06/16/13 0432  BP: 166/80 162/91 156/92 132/52  Pulse: 86 85 90 86  Temp:  100.8 F (38.2 C) 97.6 F (36.4 C) 98 F (36.7 C)  TempSrc:  Rectal Oral Axillary  Resp: 27 18 18 18   Height:      Weight:   55.747 kg (122 lb 14.4 oz) 55.2 kg (121 lb 11.1 oz)  SpO2: 96% 97% 99% 100%    Wt Readings from Last 3 Encounters:  06/16/13 55.2 kg (121 lb 11.1 oz)  06/14/13 58.06 kg (128 lb)  06/06/13 58.06 kg (128 lb)     Intake/Output Summary (Last 24 hours) at 06/16/13 0831 Last data filed at 06/16/13 0700  Gross per 24 hour  Intake   1260 ml  Output      0 ml  Net   1260 ml    Exam  General: Well developed, well nourished, NAD, appears stated age  HEENT: NCAT, PERRLA, EOMI, Anicteic Sclera, mucous membranes moist.   Neck: Supple, no JVD, no masses  Cardiovascular: S1 S2 auscultated, no rubs, murmurs or gallops. Regular rate and rhythm.  Respiratory: Diffuse Expiratory wheezing noted  Abdomen: Soft, diffusely tender, distended, + bowel sounds  Extremities: warm dry without cyanosis clubbing or edema, LUE fistula  Neuro: AAOx3, cranial nerves grossly intact. Strength 5/5 in patient's upper and lower extremities bilaterally  Skin: Without rashes exudates or nodules  Psych: Normal affect and demeanor with intact judgement and insight  Data Review   Micro Results No results found for this or any previous visit (from the past 240 hour(s)).  Radiology Reports X-ray Chest Pa And Lateral   06/15/2013   CLINICAL DATA:  Dyspnea  EXAM: CHEST  2 VIEW  COMPARISON:  06/15/2013  FINDINGS: Cardiac silhouette is normal in size. Normal mediastinal and hilar contours.  Clear lungs.  The bony thorax is demineralized but intact.  IMPRESSION: No active cardiopulmonary disease.   Electronically Signed   By: Amie Portland M.D.   On: 06/15/2013 20:22   Dg Chest 2 View  06/15/2013   CLINICAL DATA:  Fatigue. Fever. Shortness of breath. Hypertension. Cough.  EXAM: CHEST  2 VIEW  COMPARISON:  DG  CHEST 1V PORT dated 01/06/2013; DG CHEST 1V PORT dated 01/04/2013; DG CHEST 1V PORT dated 02/06/2012  FINDINGS: Tortuous and atherosclerotic aortic arch. Heart size within normal limits.  Mild scarring peripherally at the left lung base. Mild central airway thickening. No pleural effusion.  IMPRESSION: 1. Airway thickening is present, suggesting  bronchitis or reactive airways disease. 2. Mild scarring at the left lung base. 3. Atherosclerotic aortic arch.   Electronically Signed   By: Herbie BaltimoreWalt  Liebkemann M.D.   On: 06/15/2013 14:26   Ct Head Wo Contrast  06/15/2013   CLINICAL DATA:  80 year old female with fever, shortness of breath, fatigue. Initial encounter.  EXAM: CT HEAD WITHOUT CONTRAST  TECHNIQUE: Contiguous axial images were obtained from the base of the skull through the vertex without intravenous contrast.  COMPARISON:  Head CT without contrast 07/26/2011. Brain MRI 08/04/2004.  FINDINGS: No acute orbit or scalp soft tissue findings. No acute osseous abnormality identified. Stable minimal paranasal sinus mucosal thickening. Stable minor chronic left mastoid fluid.  Calcified atherosclerosis at the skull base. Cerebral volume is not significantly changed since 2013. Stable ventricle size and configuration. Chronic patchy cerebral white matter hypodensity not significantly changed. Multiple chronic lacunar infarcts in the deep gray matter, most notably the left thalamus. No midline shift, mass effect, or evidence of intracranial mass lesion. No acute intracranial hemorrhage identified. No evidence of cortically based acute infarction identified. No suspicious intracranial vascular hyperdensity.  IMPRESSION: No acute intracranial abnormality.  Chronic small vessel disease.   Electronically Signed   By: Augusto GambleLee  Hall M.D.   On: 06/15/2013 16:48    CBC  Recent Labs Lab 06/15/13 1459 06/16/13 0540  WBC 8.8 6.2  HGB 14.0 11.3*  HCT 41.9 34.8*  PLT 456* 346  MCV 85.5 86.6  MCH 28.6 28.1  MCHC 33.4 32.5   RDW 15.3 15.2  LYMPHSABS 1.5 1.6  MONOABS 1.0 1.1*  EOSABS 0.0 0.2  BASOSABS 0.0 0.0    Chemistries   Recent Labs Lab 06/10/13 1645 06/15/13 1459 06/16/13 0540  NA 139 137 137  K 4.5 4.6 3.6*  CL 98 93* 96  CO2 29 21 21   GLUCOSE 100* 194* 157*  BUN 53* 62* 60*  CREATININE 2.51* 2.56* 2.48*  CALCIUM 9.2 10.0 8.6  AST  --  26 24  ALT  --  13 12  ALKPHOS  --  96 73  BILITOT  --  0.5 0.6   ------------------------------------------------------------------------------------------------------------------ estimated creatinine clearance is 11.2 ml/min (by C-G formula based on Cr of 2.48). ------------------------------------------------------------------------------------------------------------------  Recent Labs  06/15/13 1459  HGBA1C 8.6*   ------------------------------------------------------------------------------------------------------------------ No results found for this basename: CHOL, HDL, LDLCALC, TRIG, CHOLHDL, LDLDIRECT,  in the last 72 hours ------------------------------------------------------------------------------------------------------------------ No results found for this basename: TSH, T4TOTAL, FREET3, T3FREE, THYROIDAB,  in the last 72 hours ------------------------------------------------------------------------------------------------------------------ No results found for this basename: VITAMINB12, FOLATE, FERRITIN, TIBC, IRON, RETICCTPCT,  in the last 72 hours  Coagulation profile No results found for this basename: INR, PROTIME,  in the last 168 hours  No results found for this basename: DDIMER,  in the last 72 hours  Cardiac Enzymes  Recent Labs Lab 06/15/13 1459  TROPONINI <0.30   ------------------------------------------------------------------------------------------------------------------ No components found with this basename: POCBNP,     Anabeth Chilcott D.O. on 06/16/2013 at 8:31 AM  Between 7am to 7pm - Pager -  [phone]  After 7pm go to [email or web address] - password [email or web address]  And look for the night coverage person covering for me after hours  [facility] Office  [phone]

## 2013-06-16 NOTE — Progress Notes (Signed)
Utilization review completed.  P.J. Toretto Tingler,RN,BSN Case Manager 

## 2013-06-16 NOTE — ED Provider Notes (Signed)
Medical screening examination/treatment/procedure(s) were conducted as a shared visit with non-physician practitioner(s) and myself.  I personally evaluated the patient during the encounter.   EKG Interpretation   Date/Time:  Saturday June 15 2013 13:33:48 EDT Ventricular Rate:  98 PR Interval:    QRS Duration: 102 QT Interval:  449 QTC Calculation: 573 R Axis:   73 Text Interpretation:  Accelerated junctional rhythm Anteroseptal infarct,  age indeterminate Prolonged QT interval Junctional Rhythm noted 01-06-2013  Confirmed by Fayrene Fearing  MD, Kevonna Nolte ([id number]) on 06/15/2013 2:36:23 PM      Patient seen and examined. History and physical reviewed with PA as well. Initially no clear indication for admission, or cardiology for weakness. Recheck rectal temp 101.9. Patient remains weekend. She'll be admitted. Cultures are obtained.      Rolland Porter, MD 06/16/13 1102

## 2013-06-17 ENCOUNTER — Encounter: Payer: Self-pay | Admitting: *Deleted

## 2013-06-17 ENCOUNTER — Observation Stay (HOSPITAL_COMMUNITY): Payer: Medicare Other

## 2013-06-17 DIAGNOSIS — N189 Chronic kidney disease, unspecified: Secondary | ICD-10-CM

## 2013-06-17 DIAGNOSIS — G9340 Encephalopathy, unspecified: Secondary | ICD-10-CM

## 2013-06-17 LAB — GLUCOSE, CAPILLARY
GLUCOSE-CAPILLARY: 181 mg/dL — AB (ref 70–99)
GLUCOSE-CAPILLARY: 73 mg/dL (ref 70–99)
GLUCOSE-CAPILLARY: 77 mg/dL (ref 70–99)
Glucose-Capillary: 90 mg/dL (ref 70–99)
Glucose-Capillary: 159 mg/dL — ABNORMAL HIGH (ref 70–99)

## 2013-06-17 LAB — COMPREHENSIVE METABOLIC PANEL
ALT: 14 U/L (ref 0–35)
AST: 27 U/L (ref 0–37)
Albumin: 2.9 g/dL — ABNORMAL LOW (ref 3.5–5.2)
Alkaline Phosphatase: 62 U/L (ref 39–117)
BUN: 58 mg/dL — ABNORMAL HIGH (ref 6–23)
CALCIUM: 8.5 mg/dL (ref 8.4–10.5)
CO2: 21 mEq/L (ref 19–32)
Chloride: 100 mEq/L (ref 96–112)
Creatinine, Ser: 2.6 mg/dL — ABNORMAL HIGH (ref 0.50–1.10)
GFR calc non Af Amer: 15 mL/min — ABNORMAL LOW (ref >90)
GFR, EST AFRICAN AMERICAN: 18 mL/min — AB (ref >90)
GLUCOSE: 75 mg/dL (ref 70–99)
Potassium: 3.8 mEq/L (ref 3.7–5.3)
SODIUM: 139 meq/L (ref 137–147)
Total Bilirubin: 0.5 mg/dL (ref 0.3–1.2)
Total Protein: 6.3 g/dL (ref 6.0–8.3)

## 2013-06-17 LAB — CBC WITH DIFFERENTIAL/PLATELET
BASOS ABS: 0 10*3/uL (ref 0.0–0.1)
BASOS PCT: 0 % (ref 0–1)
EOS PCT: 7 % — AB (ref 0–5)
Eosinophils Absolute: 0.5 10*3/uL (ref 0.0–0.7)
HCT: 32.5 % — ABNORMAL LOW (ref 36.0–46.0)
Hemoglobin: 10.4 g/dL — ABNORMAL LOW (ref 12.0–15.0)
Lymphocytes Relative: 32 % (ref 12–46)
Lymphs Abs: 2.2 10*3/uL (ref 0.7–4.0)
MCH: 27.7 pg (ref 26.0–34.0)
MCHC: 32 g/dL (ref 30.0–36.0)
MCV: 86.7 fL (ref 78.0–100.0)
Monocytes Absolute: 0.9 10*3/uL (ref 0.1–1.0)
Monocytes Relative: 13 % — ABNORMAL HIGH (ref 3–12)
Neutro Abs: 3.4 10*3/uL (ref 1.7–7.7)
Neutrophils Relative %: 48 % (ref 43–77)
PLATELETS: 306 10*3/uL (ref 150–400)
RBC: 3.75 MIL/uL — ABNORMAL LOW (ref 3.87–5.11)
RDW: 15.2 % (ref 11.5–15.5)
WBC: 7 10*3/uL (ref 4.0–10.5)

## 2013-06-17 LAB — URINE CULTURE
Colony Count: NO GROWTH
Culture: NO GROWTH

## 2013-06-17 MED ORDER — LEVOFLOXACIN 750 MG PO TABS: 750.0000 mg | ORAL_TABLET | ORAL | Status: DC

## 2013-06-17 NOTE — Discharge Summary (Addendum)
Physician Discharge Summary  [name] WUJ:[id number] DOB: [birth year] DOA: 06/15/2013  PCP: Georgianne Fick, MD  Admit date: 06/15/2013 Discharge date: 06/17/2013  Time spent: 40 minutes  Recommendations for Outpatient Follow-up:  Patient was discharged home. She is to call her primary care physician as well as [facility] one week of discharge. Patient should consider discussing her diabetes management with her primary physician. Should she continue taking her medications as prescribed. She should probably carb modified diet.  Discharge Diagnoses:  Acute encephalopathy Congestive heart failure, diastolic Coronary artery disease Chronic kidney disease stage V Diabetes mellitus, uncontrolled  Discharge Condition: Stable  Diet recommendation: Carb modified  Filed Weights   06/15/13 2045 06/16/13 0432 06/17/13 0402  Weight: 55.747 kg (122 lb 14.4 oz) 55.2 kg (121 lb 11.1 oz) 55.611 kg (122 lb 9.6 oz)    History of present illness:  This is a 80 y.o. year old female with multiple medical problems including diastolic CHF, stage V chronic kidney disease, coronary artery disease, insulin-dependent diabetes presenting with encephalopathy, dehydration and weakness. Patient is noted to have had a fairly complicated hospital course October 2014 with noted CHF exacerbation requiring hemodialysis via permacath. Patient is followed by [facility]. There is a reported increase in CHF symptoms about 2 weeks ago. Patient's Lasix dose was increased. Have followup with cardiology yesterday with reported improvement in shortness of breath. However, the family states the patient has had progressive confusion, lethargy as well as shortness of breath and weakness since earlier today. Patient also had a elevated temperature though not checked at home. Family states the patient is able to ambulate out of the wheelchair on a regular basis. Has her son that she lives with  lesions takes care of her. However, he is going in for inpatient evaluation today as well. Patient's family deny any chest pains, nausea, vomiting, diarrhea, dysuria. Family states the patient has had increased fluid intake over the past 24 hours. Urine output has been fairly stable.  In the ER, patient with noted temperature of 101.4. Hemodynamically stable with blood pressures in the 120s to 180s. White blood cell count within normal limits at 8.8. Hemoglobin 14. Creatinine of 2.56. BUN 62. Chest x-ray negative for pneumonia but does show some mild bronchitic changes. Head CT with no acute abnormalities. UA negative for infection. Pt given 1/2L NS bolus with improvement in weakness and lethargy.   Hospital Course:  Acute encephalopathy  -Multifactorial including dehydration secondary to over diuresis, questionable mild uremia, questionable infectious cough  -CT of the head was negative  -UA negative for infection, chest x-ray negative for any cardiopulmonary process  -Patient appears to be back to her baseline  -Continue to have cough, questionable pneumonia, was initially placed on vancomycin and Zosyn  -Will discharge patient with Levaquin -Blood cultures show no growth to date.  Chronic Congestive heart failure, likely diastolic  -Currently not in exacerbation appears to be euvolemic  -Was found to be dry on initial examination during admission  -Will continue Lasix, at half dose  -Continue daily weights, monitoring of intake and output  -Last echocardiogram 01/05/2013 shows an EF of 65-70%, however, he was not sufficient to allow evaluation of LV diastolic function   Coronary artery disease  -Stable, patient is chest pain-free   Chronic kidney disease stage V  -Spoke with nephrology -Outpatient followup  Diabetes mellitus  -Continue on sliding scale and CBG monitoring  -Hemoglobin A1c 11.3  -Will need outpatient followup and management  Procedures: None  Consultations: None  Discharge Exam: Filed Vitals:   06/17/13 0613  BP: 141/59  Pulse: 63  Temp: 98 F (36.7 C)  Resp: 18   Exam  General: Well developed, well nourished, NAD, appears stated age  HEENT: NCAT, mucous membranes moist.  Neck: Supple, no JVD, no masses  Cardiovascular: S1 S2 auscultated, no rubs, murmurs or gallops. Regular rate and rhythm.  Respiratory: Diffuse Expiratory wheezing noted, improving  Abdomen: Soft, diffusely tender, distended, + bowel sounds  Extremities: warm dry without cyanosis clubbing or edema, LUE fistula  Neuro: AAOx3,no focal deficits Skin: Without rashes exudates or nodules  Psych: Normal affect and demeanor with intact judgement and insight  Discharge Instructions      Discharge Orders   Future Orders Complete By Expires   Discharge instructions  As directed    Comments:     Patient was discharged home. She is to call her primary care physician as well as [facility] one week of discharge. Patient should consider discussing her diabetes management with her primary physician. Should she continue taking her medications as prescribed. She should probably carb modified diet.   Increase activity slowly  As directed        Medication List         acetaminophen 325 MG tablet  Commonly known as:  TYLENOL  Take 2 tablets (650 mg total) by mouth every 4 (four) hours as needed.     allopurinol 100 MG tablet  Commonly known as:  ZYLOPRIM  Take 100 mg by mouth daily.     amLODipine 10 MG tablet  Commonly known as:  NORVASC  Take 5 mg by mouth daily.     aspirin EC 81 MG tablet  Take 81 mg by mouth daily.     calcitRIOL 0.25 MCG capsule  Commonly known as:  ROCALTROL  Take 0.25 mcg by mouth daily.     calcium acetate 667 MG capsule  Commonly known as:  PHOSLO  Take 1 capsule (667 mg total) by mouth 3 (three) times daily with meals.     carvedilol 12.5 MG tablet  Commonly known as:  COREG  Take 0.5  tablets (6.25 mg total) by mouth 2 (two) times daily with a meal.     clopidogrel 75 MG tablet  Commonly known as:  PLAVIX  Take 75 mg by mouth daily.     furosemide 80 MG tablet  Commonly known as:  LASIX  Take 1 tablet (80 mg total) by mouth 2 (two) times daily.     gabapentin 300 MG capsule  Commonly known as:  NEURONTIN  Take 1 capsule (300 mg total) by mouth at bedtime.     hydrALAZINE 25 MG tablet  Commonly known as:  APRESOLINE  Take 1 tablet (25 mg total) by mouth every 8 (eight) hours.     insulin glargine 100 UNIT/ML injection  Commonly known as:  LANTUS  Inject 36 Units into the skin daily.     isosorbide mononitrate 60 MG 24 hr tablet  Commonly known as:  IMDUR  Take 60 mg by mouth daily.     levofloxacin 750 MG tablet  Commonly known as:  LEVAQUIN  Take 1 tablet (750 mg total) by mouth every other day.     levothyroxine 75 MCG tablet  Commonly known as:  SYNTHROID, LEVOTHROID  Take 75 mcg by mouth daily before breakfast.     multivitamin Tabs tablet  Take 1 tablet by mouth at bedtime.  nitroGLYCERIN 0.4 MG SL tablet  Commonly known as:  NITROSTAT  Place 0.4 mg under the tongue every 5 (five) minutes as needed for chest pain.     nortriptyline 50 MG capsule  Commonly known as:  PAMELOR  Take 50 mg by mouth daily.     ONGLYZA 5 MG Tabs tablet  Generic drug:  saxagliptin HCl  Take 5 mg by mouth daily.     polyethylene glycol packet  Commonly known as:  MIRALAX / GLYCOLAX  Take 17 g by mouth daily.     pravastatin 40 MG tablet  Commonly known as:  PRAVACHOL  Take 40 mg by mouth daily.     travoprost (benzalkonium) 0.004 % ophthalmic solution  Commonly known as:  TRAVATAN  Place 1 drop into both eyes at bedtime.       Allergies  Allergen Reactions  . Aspirin Nausea And Vomiting    325 mg (adult strength) [name]    Follow-up Information   Follow up with [facility], [place].  Schedule an appointment as soon as possible for a visit in 1 week. [facility] followup)    Specialty:  Internal Medicine   Contact information:   [address] [place] [address] [phone]       Schedule an appointment as soon as possible for a visit with [facility].   Contact information:   [address] [place] [address] [phone]        The results of significant diagnostics from this hospitalization (including imaging, microbiology, ancillary and laboratory) are listed below for reference.    Significant Diagnostic Studies: Dg Chest 1 View  06/17/2013   CLINICAL DATA:  Short of breath.  Cough and congestion.  Wheezing.  EXAM: CHEST - 1 VIEW  COMPARISON:  06/15/2013  FINDINGS: Cardiac silhouette is normal in size and configuration. No mediastinal or hilar masses. Lungs are clear. No pleural effusion or pneumothorax.  Bony thorax is demineralized but grossly intact.  IMPRESSION: No acute cardiopulmonary disease.   Electronically Signed   By: Amie Portland M.D.   On: 06/17/2013 11:25   X-ray Chest Pa And Lateral   06/15/2013   CLINICAL DATA:  Dyspnea  EXAM: CHEST  2 VIEW  COMPARISON:  06/15/2013  FINDINGS: Cardiac silhouette is normal in size. Normal mediastinal and hilar contours.  Clear lungs.  The bony thorax is demineralized but intact.  IMPRESSION: No active cardiopulmonary disease.   Electronically Signed   By: Amie Portland M.D.   On: 06/15/2013 20:22   Dg Chest 2 View  06/15/2013   CLINICAL DATA:  Fatigue. Fever. Shortness of breath. Hypertension. Cough.  EXAM: CHEST  2 VIEW  COMPARISON:  DG CHEST 1V PORT dated 01/06/2013; DG CHEST 1V PORT dated 01/04/2013; DG CHEST 1V PORT dated 02/06/2012  FINDINGS: Tortuous and atherosclerotic aortic arch. Heart size within normal limits.  Mild scarring peripherally at the left lung base. Mild central airway thickening. No pleural effusion.  IMPRESSION: 1. Airway thickening is present, suggesting bronchitis or  reactive airways disease. 2. Mild scarring at the left lung base. 3. Atherosclerotic aortic arch.   Electronically Signed   By: Herbie Baltimore M.D.   On: 06/15/2013 14:26   Ct Head Wo Contrast  06/15/2013   CLINICAL DATA:  80 year old female with fever, shortness of breath, fatigue. Initial encounter.  EXAM: CT HEAD WITHOUT CONTRAST  TECHNIQUE: Contiguous axial images were obtained from the base of the  skull through the vertex without intravenous contrast.  COMPARISON:  Head CT without contrast 07/26/2011. Brain MRI 08/04/2004.  FINDINGS: No acute orbit or scalp soft tissue findings. No acute osseous abnormality identified. Stable minimal paranasal sinus mucosal thickening. Stable minor chronic left mastoid fluid.  Calcified atherosclerosis at the skull base. Cerebral volume is not significantly changed since 2013. Stable ventricle size and configuration. Chronic patchy cerebral white matter hypodensity not significantly changed. Multiple chronic lacunar infarcts in the deep gray matter, most notably the left thalamus. No midline shift, mass effect, or evidence of intracranial mass lesion. No acute intracranial hemorrhage identified. No evidence of cortically based acute infarction identified. No suspicious intracranial vascular hyperdensity.  IMPRESSION: No acute intracranial abnormality.  Chronic small vessel disease.   Electronically Signed   By: Augusto GambleLee  Hall M.D.   On: 06/15/2013 16:48    Microbiology: Recent Results (from the past 240 hour(s))  URINE CULTURE     Status: None   Collection Time    06/15/13  3:07 PM      Result Value Ref Range Status   Specimen Description URINE, CATHETERIZED   Final   Special Requests NONE   Final   Culture  Setup Time     Final   Value: 06/16/2013 01:41     Performed at [facility]   Colony Count     Final   Value: NO GROWTH     Performed at [facility]   Culture     Final   Value: NO GROWTH     Performed at [facility]   Report  Status 06/17/2013 FINAL   Final  CULTURE, BLOOD (ROUTINE X 2)     Status: None   Collection Time    06/15/13  7:00 PM      Result Value Ref Range Status   Specimen Description BLOOD RIGHT ANTECUBITAL   Final   Special Requests BOTTLES DRAWN AEROBIC ONLY 10CC   Final   Culture  Setup Time     Final   Value: 06/16/2013 00:59     Performed at [facility]   Culture     Final   Value:        BLOOD CULTURE RECEIVED NO GROWTH TO DATE CULTURE WILL BE HELD FOR 5 DAYS BEFORE ISSUING A FINAL NEGATIVE REPORT     Performed at [facility]   Report Status PENDING   Incomplete  CULTURE, BLOOD (ROUTINE X 2)     Status: None   Collection Time    06/15/13  7:15 PM      Result Value Ref Range Status   Specimen Description BLOOD RIGHT HAND   Final   Special Requests BOTTLES DRAWN AEROBIC AND ANAEROBIC 5CC EA   Final   Culture  Setup Time     Final   Value: 06/16/2013 00:59     Performed at [facility]   Culture     Final   Value:        BLOOD CULTURE RECEIVED NO GROWTH TO DATE CULTURE WILL BE HELD FOR 5 DAYS BEFORE ISSUING A FINAL NEGATIVE REPORT     Performed at [facility]   Report Status PENDING   Incomplete     Labs: Basic Metabolic Panel:  Recent Labs Lab 06/10/13 1645 06/15/13 1459 06/16/13 0540 06/16/13 1230 06/17/13 0413  NA 139 137 137 133* 139  K 4.5 4.6 3.6* 4.9 3.8  CL 98 93* 96 96 100  CO2 29  21 21 18* 21  GLUCOSE 100* 194* 157* 173* 75  BUN 53* 62* 60* 58* 58*  CREATININE 2.51* 2.56* 2.48* 2.34* 2.60*  CALCIUM 9.2 10.0 8.6 8.4 8.5  PHOS  --   --   --  4.2  --    Liver Function Tests:  Recent Labs Lab 06/15/13 1459 06/16/13 0540 06/16/13 1230 06/17/13 0413  AST 26 24  --  27  ALT 13 12  --  14  ALKPHOS 96 73  --  62  BILITOT 0.5 0.6  --  0.5  PROT 8.4* 6.7  --  6.3  ALBUMIN 3.8 3.0* 2.9* 2.9*   No results found for this basename: LIPASE, AMYLASE,  in the last 168 hours No results found for this basename: AMMONIA,  in the  last 168 hours CBC:  Recent Labs Lab 06/15/13 1459 06/16/13 0540 06/17/13 0413  WBC 8.8 6.2 7.0  NEUTROABS 6.3 3.4 3.4  HGB 14.0 11.3* 10.4*  HCT 41.9 34.8* 32.5*  MCV 85.5 86.6 86.7  PLT 456* 346 306   Cardiac Enzymes:  Recent Labs Lab 06/15/13 1459  TROPONINI <0.30   BNP: BNP (last 3 results)  Recent Labs  01/04/13 2315 06/10/13 1645  PROBNP 16572.0* 1297.00*   CBG:  Recent Labs Lab 06/16/13 1615 06/16/13 2050 06/17/13 0006 06/17/13 0414 06/17/13 0755  GLUCAP 170* 77 181* 73 90       Signed:  Leanza Shepperson  [facility] 06/17/2013, 11:53 AM

## 2013-06-17 NOTE — Discharge Instructions (Signed)
Altered Mental Status Altered mental status most often refers to an abnormal change in your responsiveness and awareness. It can affect your speech, thought, mobility, memory, attention span, or alertness. It can range from slight confusion to complete unresponsiveness (coma). Altered mental status can be a sign of a serious underlying medical condition. Rapid evaluation and medical treatment is necessary for patients having an altered mental status. CAUSES   Low blood sugar (hypoglycemia) or diabetes.  Severe loss of body fluids (dehydration) or a body salt (electrolyte) imbalance.  A stroke or other neurologic problem, such as dementia or delirium.  A head injury or tumor.  A drug or alcohol overdose.  Exposure to toxins or poisons.  Depression, anxiety, and stress.  A low oxygen level (hypoxia).  An infection.  Blood loss.  Twitching or shaking (seizure).  Heart problems, such as heart attack or heart rhythm problems (arrhythmias).  A body temperature that is too low or too high (hypothermia or hyperthermia). DIAGNOSIS  A diagnosis is based on your history, symptoms, physical and neurologic examinations, and diagnostic tests. Diagnostic tests may include:  Measurement of your blood pressure, pulse, breathing, and oxygen levels (vital signs).  Blood tests.  Urine tests.  X-ray exams.  A computerized magnetic scan (magnetic resonance imaging, MRI).  A computerized X-ray scan (computed tomography, CT scan). TREATMENT  Treatment will depend on the cause. Treatment may include:  Management of an underlying medical or mental health condition.  Critical care or support in the hospital. HOME CARE INSTRUCTIONS   Only take over-the-counter or prescription medicines for pain, discomfort, or fever as directed by your caregiver.  Manage underlying conditions as directed by your caregiver.  Eat a healthy, well-balanced diet to maintain strength.  Join a support group or  prevention program to cope with the condition or trauma that caused the altered mental status. Ask your caregiver to help choose a program that works for you.  Follow up with your caregiver for further examination, therapy, or testing as directed. SEEK MEDICAL CARE IF:   You feel unwell or have chills.  You or your family notice a change in your behavior or your alertness.  You have trouble following your caregiver's treatment plan.  You have questions or concerns. SEEK IMMEDIATE MEDICAL CARE IF:   You have a rapid heartbeat or have chest pain.  You have difficulty breathing.  You have a fever.  You have a headache with a stiff neck.  You cough up blood.  You have blood in your urine or stool.  You have severe agitation or confusion. MAKE SURE YOU:   Understand these instructions.  Will watch your condition.  Will get help right away if you are not doing well or get worse. Document Released: 08/25/2009 Document Revised: 05/30/2011 Document Reviewed: 08/25/2009 [facility] Patient Information 2014 [place], [place]. Heart Failure Heart failure is a condition in which the heart has trouble pumping blood. This means your heart does not pump blood efficiently for your body to work well. In some cases of heart failure, fluid may back up into your lungs or you may have swelling (edema) in your lower legs. Heart failure is usually a long-term (chronic) condition. It is important for you to take good care of yourself and follow your caregiver's treatment plan. CAUSES  Some health conditions can cause heart failure. Those health conditions include:  High blood pressure (hypertension) causes the heart muscle to work harder than normal. When pressure in the blood vessels is high, the heart needs  to pump (contract) with more force in order to circulate blood throughout the body. High blood pressure eventually causes the heart to become stiff and weak.  Coronary artery disease (CAD) is the  buildup of cholesterol and fat (plaque) in the arteries of the heart. The blockage in the arteries deprives the heart muscle of oxygen and blood. This can cause chest pain and may lead to a heart attack. High blood pressure can also contribute to CAD.  Heart attack (myocardial infarction) occurs when 1 or more arteries in the heart become blocked. The loss of oxygen damages the muscle tissue of the heart. When this happens, part of the heart muscle dies. The injured tissue does not contract as well and weakens the heart's ability to pump blood.  Abnormal heart valves can cause heart failure when the heart valves do not open and close properly. This makes the heart muscle pump harder to keep the blood flowing.  Heart muscle disease (cardiomyopathy or myocarditis) is damage to the heart muscle from a variety of causes. These can include drug or alcohol abuse, infections, or unknown reasons. These can increase the risk of heart failure.  Lung disease makes the heart work harder because the lungs do not work properly. This can cause a strain on the heart, leading it to fail.  Diabetes increases the risk of heart failure. High blood sugar contributes to high fat (lipid) levels in the blood. Diabetes can also cause slow damage to tiny blood vessels that carry important nutrients to the heart muscle. When the heart does not get enough oxygen and food, it can cause the heart to become weak and stiff. This leads to a heart that does not contract efficiently.  Other conditions can contribute to heart failure. These include abnormal heart rhythms, thyroid problems, and low blood counts (anemia). Certain unhealthy behaviors can increase the risk of heart failure. Those unhealthy behaviors include:  Being overweight.  Smoking or chewing tobacco.  Eating foods high in fat and cholesterol.  Abusing illicit drugs or alcohol.  Lacking physical activity. SYMPTOMS  Heart failure symptoms may vary and can be  hard to detect. Symptoms may include:  Shortness of breath with activity, such as climbing stairs.  Persistent cough.  Swelling of the feet, ankles, legs, or abdomen.  Unexplained weight gain.  Difficulty breathing when lying flat (orthopnea).  Waking from sleep because of the need to sit up and get more air.  Rapid heartbeat.  Fatigue and loss of energy.  Feeling lightheaded, dizzy, or close to fainting.  Loss of appetite.  Nausea.  Increased urination during the night (nocturia). DIAGNOSIS  A diagnosis of heart failure is based on your history, symptoms, physical examination, and diagnostic tests. Diagnostic tests for heart failure may include:  Echocardiography.  Electrocardiography.  Chest X-ray.  Blood tests.  Exercise stress test.  Cardiac angiography.  Radionuclide scans. TREATMENT  Treatment is aimed at managing the symptoms of heart failure. Medicines, behavioral changes, or surgical intervention may be necessary to treat heart failure.  Medicines to help treat heart failure may include:  Angiotensin-converting enzyme (ACE) inhibitors. This type of medicine blocks the effects of a blood protein called angiotensin-converting enzyme. ACE inhibitors relax (dilate) the blood vessels and help lower blood pressure.  Angiotensin receptor blockers. This type of medicine blocks the actions of a blood protein called angiotensin. Angiotensin receptor blockers dilate the blood vessels and help lower blood pressure.  Water pills (diuretics). Diuretics cause the kidneys to remove salt and water  from the blood. The extra fluid is removed through urination. This loss of extra fluid lowers the volume of blood the heart pumps.  Beta blockers. These prevent the heart from beating too fast and improve heart muscle strength.  Digitalis. This increases the force of the heartbeat.  Healthy behavior changes include:  Obtaining and maintaining a healthy weight.  Stopping  smoking or chewing tobacco.  Eating heart healthy foods.  Limiting or avoiding alcohol.  Stopping illicit drug use.  Physical activity as directed by your caregiver.  Surgical treatment for heart failure may include:  A procedure to open blocked arteries, repair damaged heart valves, or remove damaged heart muscle tissue.  A pacemaker to improve heart muscle function and control certain abnormal heart rhythms.  An internal cardioverter defibrillator to treat certain serious abnormal heart rhythms.  A left ventricular assist device to assist the pumping ability of the heart. HOME CARE INSTRUCTIONS   Take your medicine as directed by your caregiver. Medicines are important in reducing the workload of your heart, slowing the progression of heart failure, and improving your symptoms.  Do not stop taking your medicine unless directed by your caregiver.  Do not skip any dose of medicine.  Refill your prescriptions before you run out of medicine. Your medicines are needed every day.  Take over-the-counter medicine only as directed by your caregiver or pharmacist.  Engage in moderate physical activity if directed by your caregiver. Moderate physical activity can benefit some people. The elderly and people with severe heart failure should consult with a caregiver for physical activity recommendations.  Eat heart healthy foods. Food choices should be free of trans fat and low in saturated fat, cholesterol, and salt (sodium). Healthy choices include fresh or frozen fruits and vegetables, fish, lean meats, legumes, fat-free or low-fat dairy products, and whole grain or high fiber foods. Talk to a dietitian to learn more about heart healthy foods.  Limit sodium if directed by your caregiver. Sodium restriction may reduce symptoms of heart failure in some people. Talk to a dietitian to learn more about heart healthy seasonings.  Use healthy cooking methods. Healthy cooking methods include  roasting, grilling, broiling, baking, poaching, steaming, or stir-frying. Talk to a dietitian to learn more about healthy cooking methods.  Limit fluids if directed by your caregiver. Fluid restriction may reduce symptoms of heart failure in some people.  Weigh yourself every day. Daily weights are important in the early recognition of excess fluid. You should weigh yourself every morning after you urinate and before you eat breakfast. Wear the same amount of clothing each time you weigh yourself. Record your daily weight. Provide your caregiver with your weight record.  Monitor and record your blood pressure if directed by your caregiver.  Check your pulse if directed by your caregiver.  Lose weight if directed by your caregiver. Weight loss may reduce symptoms of heart failure in some people.  Stop smoking or chewing tobacco. Nicotine makes your heart work harder by causing your blood vessels to constrict. Do not use nicotine gum or patches before talking to your caregiver.  Schedule and attend follow-up visits as directed by your caregiver. It is important to keep all your appointments.  Limit alcohol intake to no more than 1 drink per day for nonpregnant women and 2 drinks per day for men. Drinking more than that is harmful to your heart. Tell your caregiver if you drink alcohol several times a week. Talk with your caregiver about whether alcohol is safe  for you. If your heart has already been damaged by alcohol or you have severe heart failure, drinking alcohol should be stopped completely.  Stop illicit drug use.  Stay up-to-date with immunizations. It is especially important to prevent respiratory infections through current pneumococcal and influenza immunizations.  Manage other health conditions such as hypertension, diabetes, thyroid disease, or abnormal heart rhythms as directed by your caregiver.  Learn to manage stress.  Plan rest periods when fatigued.  Learn strategies to  manage high temperatures. If the weather is extremely hot:  Avoid vigorous physical activity.  Use air conditioning or fans or seek a cooler location.  Avoid caffeine and alcohol.  Wear loose-fitting, lightweight, and light-colored clothing.  Learn strategies to manage cold temperatures. If the weather is extremely cold:  Avoid vigorous physical activity.  Layer clothes.  Wear mittens or gloves, a hat, and a scarf when going outside.  Avoid alcohol.  Obtain ongoing education and support as needed.  Participate or seek rehabilitation as needed to maintain or improve independence and quality of life. SEEK MEDICAL CARE IF:   Your weight increases by 03 lb/1.4 kg in 1 day or 05 lb/2.3 kg in a week.  You have increasing shortness of breath that is unusual for you.  You are unable to participate in your usual physical activities.  You tire easily.  You cough more than normal, especially with physical activity.  You have any or more swelling in areas such as your hands, feet, ankles, or abdomen.  You are unable to sleep because it is hard to breathe.  You feel like your heart is beating fast (palpitations).  You become dizzy or lightheaded upon standing up. SEEK IMMEDIATE MEDICAL CARE IF:   You have difficulty breathing.  There is a change in mental status such as decreased alertness or difficulty with concentration.  You have a pain or discomfort in your chest.  You have an episode of fainting (syncope). MAKE SURE YOU:   Understand these instructions.  Will watch your condition.  Will get help right away if you are not doing well or get worse. Document Released: 03/07/2005 Document Revised: 07/02/2012 Document Reviewed: 03/29/2012 [facility] Patient Information 2014 [place], [place].

## 2013-06-17 NOTE — Progress Notes (Signed)
Inpatient Diabetes Program Recommendations  AACE/ADA: New Consensus Statement on Inpatient Glycemic Control (2013)  Target Ranges:  Prepandial:   less than 140 mg/dL      Peak postprandial:   less than 180 mg/dL (1-2 hours)      Critically ill patients:  140 - 180 mg/dL   Reason for Assessmemt: Elevated A1C  Diabetes history: Type 2 Outpatient Diabetes medications: Lantus 30 units daily, Onglyza 5 mg Current orders for Inpatient glycemic control: Lantus 30 units daily, Novolog sensitive correction q 4 hours  Results for [name], [name] (MRN [id number]) as of 06/17/2013 09:12  Ref. Range 06/15/2013 21:15 06/16/2013 00:59 06/16/2013 04:25 06/16/2013 07:50 06/16/2013 16:15 06/16/2013 20:50 06/17/2013 00:06 06/17/2013 04:14 06/17/2013 07:55  Glucose-Capillary Latest Range: 70-99 mg/dL 625 (H) 98 638 (H) 937 (H) 170 (H) 77 181 (H) 73 90   Note:  Patient at risk for hypoglycemia given chronic kidney disease and acute encephalopathy.  CBG this morning less than 100 mg/dl.  Request the following:  MD, please change correction scale from q 4 hours to ac  Change CBG's to ac & hs with additional check at either 2 or 4 AM, if desired  Add a diagnosis of diabetes to the Active Hospital Problem List Thank you, Danielle Rankin. Elsie Lincoln, RN, MSN, CDE

## 2013-06-22 LAB — CULTURE, BLOOD (ROUTINE X 2)
Culture: NO GROWTH
Culture: NO GROWTH

## 2013-07-22 ENCOUNTER — Telehealth: Payer: Self-pay | Admitting: *Deleted

## 2013-07-22 MED ORDER — CARVEDILOL 6.25 MG PO TABS
6.2500 mg | ORAL_TABLET | Freq: Two times a day (BID) | ORAL | Status: DC
Start: 2013-07-22 — End: 2013-11-05

## 2013-07-22 NOTE — Telephone Encounter (Signed)
Pt's daughter Suzette Battiest was calling in regards to her mother's Coreg. She stated that the pharmacy has not gotten a Rx since December and she wanted to know if she still needs to be taking this medication.  CH

## 2013-07-22 NOTE — Telephone Encounter (Signed)
Returned call and informed son per instructions by MD.  Verbalized understanding and agreed w/ plan.  Rx sent to pharmacy.

## 2013-07-22 NOTE — Telephone Encounter (Signed)
Returned call and pt verified x 2.  Pt put son, Lynda Rainwater, on phone.  Stated his sister Suzette Battiest does pt's meds and wanted to know if pt is supposed to be taking Coreg b/c the pharmacy said it hasn't been filled since December.  Denied pt w/ any problems or concerns.  Informed Dr. Rennis Golden will be notified to review and advise as Coreg is still listed on pt's current med list.  Verbalized understanding and agreed w/ plan.  Message forwarded to Dr. Rennis Golden.

## 2013-07-22 NOTE — Telephone Encounter (Signed)
She should be taking coreg. It is for her heart failure.  Dr. Rexene Edison

## 2013-08-01 ENCOUNTER — Encounter (HOSPITAL_COMMUNITY): Payer: Medicare Other

## 2013-08-01 ENCOUNTER — Other Ambulatory Visit: Payer: Self-pay | Admitting: *Deleted

## 2013-08-01 ENCOUNTER — Other Ambulatory Visit (HOSPITAL_COMMUNITY): Payer: Medicare Other

## 2013-08-01 ENCOUNTER — Ambulatory Visit: Payer: Medicare Other | Admitting: Family

## 2013-08-01 ENCOUNTER — Encounter: Payer: Self-pay | Admitting: Family

## 2013-08-01 DIAGNOSIS — I70229 Atherosclerosis of native arteries of extremities with rest pain, unspecified extremity: Secondary | ICD-10-CM

## 2013-08-02 ENCOUNTER — Ambulatory Visit (INDEPENDENT_AMBULATORY_CARE_PROVIDER_SITE_OTHER): Payer: Medicare Other | Admitting: Family

## 2013-08-02 ENCOUNTER — Ambulatory Visit (HOSPITAL_COMMUNITY)
Admission: RE | Admit: 2013-08-02 | Discharge: 2013-08-02 | Disposition: A | Discharge status: Home / Self Care | Payer: Medicare Other | Source: Ambulatory Visit | Attending: Family | Admitting: Family

## 2013-08-02 ENCOUNTER — Encounter: Payer: Self-pay | Admitting: Family

## 2013-08-02 VITALS — BP 156/83 | HR 98 | Temp 98.3°F | Resp 18 | Ht <= 58 in | Wt 122.0 lb

## 2013-08-02 DIAGNOSIS — M79673 Pain in unspecified foot: Secondary | ICD-10-CM

## 2013-08-02 DIAGNOSIS — I70229 Atherosclerosis of native arteries of extremities with rest pain, unspecified extremity: Secondary | ICD-10-CM

## 2013-08-02 DIAGNOSIS — M79609 Pain in unspecified limb: Secondary | ICD-10-CM

## 2013-08-02 DIAGNOSIS — M7989 Other specified soft tissue disorders: Secondary | ICD-10-CM

## 2013-08-02 DIAGNOSIS — L819 Disorder of pigmentation, unspecified: Secondary | ICD-10-CM | POA: Insufficient documentation

## 2013-08-02 NOTE — Progress Notes (Signed)
[facility] [place] HISTORY AND PHYSICAL -PAD  History of Present Illness [name] is a 80 y.o. female patient of [name] who is s/p left below the knee amputation.   She is nonambulatory as she does not have a prosthesis for her left lower extremity. She denies any claudication symptoms in the right and has no history of rest pain or nonhealing ulcers on the right.  She is not currently on dialysis.She had a left basilic vein transposition placed on 05/27/2011. She had a tunneled dialysis catheter placed but she states that it was only used 3 times. She has a fistula in her left upper arm which does not appear to be functional and is nonpulsatile and sclerotic.   ABI's could not be obtained previously due to non compressible vessels. She returns today for worsening right foot pain over the last 3 weeks, denies injury. She does have known gout. When asked she reports that she dangles her right foot from her wheelchair, does not elevate her foot. She denies any non healing wounds.  Pt Diabetic: Yes Pt smoker: former smoker, quit in 1988  Pt meds include: Statin :Yes ASA: Yes Other anticoagulants/antiplatelets: Plavix  Past Medical History  Diagnosis Date  . Coronary artery disease   . TIA (transient ischemic attack)   . Hypertension   . Shingles   . Stroke   . Anemia   . CAD (coronary artery disease) 02/08/2011  . Cardiomyopathy, idiopathic 02/08/2011  . Chronic kidney disease   . Angina     Takes Isosorbide  . Unstable angina 02/08/2011  . Myocardial infarct     x 3 unsure of years  . Irregular heartbeat   . Ulcer   . GERD (gastroesophageal reflux disease)     takes Protonix and zantac  . Hx of transient ischemic attack (TIA)   . Memory loss   . CHF (congestive heart failure)     Takes Lasix  . Gout     takes allopurinol  . Peripheral vascular disease     left lower  leg  . Diabetes mellitus     type 2 NIDDM x 2 years; no meds  .  Chronic kidney disease (CKD), stage IV (severe)   . Arthritis   . Shortness of breath   . Thyroid disease   . Hypothyroidism     (SEVERE) Takes Levothryroxine  . Hypothyroid 02/08/2011  . Nonischemic cardiomyopathy     EF now is 55%, reduced due to myxedema, which is improved.  . Dyslipidemia   . Peripheral neuropathy   . H/O echocardiogram 09/06/11    Indication- nonIschemic Cardiomyopathy. EF = now greater than 55% with no regional wall motion abnormalities. Tthere is mild to moderate trisuspid regurgitayion and mild pulmonary hypertension with an RVSP of 35 mmHg as well as stage 1 diastolic dysfunction and mild to moderate LVH.  Marland Kitchen Abnormal nuclear stress test 06/01/09    Demonstrated a new area of infarct scar, peri-infarct ischemia seen in the inferolateral territory. EF eas normal at 70% with mild hypocontractility at the apex, distal inferolateral wall.    Social History History  Substance Use Topics  . Smoking status: Former Smoker -- 0.50 packs/day for 40 years    Quit date: 07/05/1986  . Smokeless tobacco: Never Used     Comment: quit smoking 1988  . Alcohol Use: No    Family History Family History  Problem Relation Age of Onset  . Cancer Sister  STOMACH  . Diabetes Sister   . Cancer Brother     BONE  . Diabetes Brother   . Anesthesia problems Neg Hx   . Hypotension Neg Hx   . Malignant hyperthermia Neg Hx   . Pseudochol deficiency Neg Hx   . Hyperlipidemia Daughter   . Hypertension Daughter   . Heart disease Daughter     before age 33  . Kidney disease Daughter   . Heart attack Daughter   . Other Daughter     varicose veins  . Diabetes Daughter   . Heart disease Son     before age 40  . Hyperlipidemia Son   . Hypertension Son   . Heart attack Son     Past Surgical History  Procedure Laterality Date  . Back surgery      [facility]  . Eye surgery      Left eye surgery; cataract removal  . Left bka  07/05/2011  . Av fistula placement     . Amputation  07/05/2011    Procedure: AMPUTATION BELOW KNEE;  Surgeon: Chuck Hint, MD;  Location: [facility] OR;  Service: Vascular;  Laterality: Left;  . Angioplasty  1988  . Cardiac catheterization  09/28/07    Demonstrated multiple sequential lesions around 40 to 30% in the RCA territory.  . Left lower extremity venous duplex Left 06/27/11    Summary: No evidence of DVT involving the left lower extremity and right common femoral vein.   . Lower extremity arterial evaluation  06/27/11    SUMMARY: Right: ABI not ascertained due to false elevation in BP secondary to calcification (posterior tibial artery is non compressible). Left: ABI indicates moderate reduction in arterial flow. Bilateral: Great toe PPG waveforms indicate adequate perfusion. Great toe pressures not obtained due to patient's movements secondary to pain.  . Duplex doppler  05/10/11    LE arterial dopplers demonstrate bilaterally reduced ABIs of 0.91 on right & 0.56 on left. She does report some decreased pain on the left, & there's moderate mixed-density plaque in the right SFA w/50 to 69% reduction. There's a 69% reduction in the left SFA & does appear to be occlusive disease of left posterior tibial artery. Right posterior dorsalis pedis artery demonstrates occlusive disease  . Insertion of dialysis catheter N/A 01/06/2013    Procedure: INSERTION OF DIALYSIS CATHETER;  Surgeon: Larina Earthly, MD;  Location: [facility] OR;  Service: Vascular;  Laterality: N/A;    Allergies  Allergen Reactions  . Aspirin Nausea And Vomiting    325 mg (adult strength) [name]     Current Outpatient Prescriptions  Medication Sig Dispense Refill  . acetaminophen (TYLENOL) 325 MG tablet Take 2 tablets (650 mg total) by mouth every 4 (four) hours as needed.      Marland Kitchen allopurinol (ZYLOPRIM) 100 MG tablet Take 100 mg by mouth daily.       Marland Kitchen amLODipine (NORVASC) 10 MG tablet Take 5 mg by mouth daily.        Marland Kitchen aspirin EC 81 MG tablet Take 81 mg by mouth daily.      . calcitRIOL (ROCALTROL) 0.25 MCG capsule Take 0.25 mcg by mouth daily.       . calcium acetate (PHOSLO) 667 MG capsule Take 1 capsule (667 mg total) by mouth 3 (three) times daily with meals.      . carvedilol (COREG) 6.25 MG tablet Take 1 tablet (6.25 mg  total) by mouth 2 (two) times daily with a meal.  60 tablet  1  . clopidogrel (PLAVIX) 75 MG tablet Take 75 mg by mouth daily.      . furosemide (LASIX) 80 MG tablet Take 1 tablet (80 mg total) by mouth 2 (two) times daily.  60 tablet  6  . gabapentin (NEURONTIN) 300 MG capsule Take 1 capsule (300 mg total) by mouth at bedtime.      . hydrALAZINE (APRESOLINE) 25 MG tablet Take 1 tablet (25 mg total) by mouth every 8 (eight) hours.      . insulin glargine (LANTUS) 100 UNIT/ML injection Inject 36 Units into the skin daily.      . isosorbide mononitrate (IMDUR) 60 MG 24 hr tablet Take 60 mg by mouth daily.       Marland Kitchen levofloxacin (LEVAQUIN) 750 MG tablet Take 1 tablet (750 mg total) by mouth every other day.  2 tablet  0  . levothyroxine (SYNTHROID, LEVOTHROID) 75 MCG tablet Take 75 mcg by mouth daily before breakfast.      . multivitamin (RENA-VIT) TABS tablet Take 1 tablet by mouth at bedtime.    0  . nitroGLYCERIN (NITROSTAT) 0.4 MG SL tablet Place 0.4 mg under the tongue every 5 (five) minutes as needed for chest pain.       . nortriptyline (PAMELOR) 50 MG capsule Take 50 mg by mouth daily.       . ONGLYZA 5 MG TABS tablet Take 5 mg by mouth daily.       . polyethylene glycol (MIRALAX / GLYCOLAX) packet Take 17 g by mouth daily.  14 each  0  . pravastatin (PRAVACHOL) 40 MG tablet Take 40 mg by mouth daily.      . travoprost, benzalkonium, (TRAVATAN) 0.004 % ophthalmic solution Place 1 drop into both eyes at bedtime.        No current facility-administered medications for this visit.    ROS: See HPI for pertinent positives and negatives.   Physical Examination  Filed Vitals:    08/02/13 1422  BP: 156/83  Pulse: 98  Temp: 98.3 F (36.8 C)  TempSrc: Oral  Resp: 18  Height: 4' 9.09" (1.45 m)  Weight: 122 lb (55.339 kg)  SpO2: 96%   Body mass index is 26.32 kg/(m^2).   General: A&O x 3, WDWN. Gait: does not walk, is in wheelchair Eyes: PERRLA. Pulmonary: CTAB, without wheezes , rales or rhonchi. Cardiac: regular Rythm , without detected murmur.        Aorta is not palpable. Radial pulses: are 1+ palpable and =.                           VASCULAR EXAM: Extremities without ischemic changes  without Gangrene; without open wounds.                                                                                                          LE Pulses LEFT RIGHT       FEMORAL  not  palpable  not palpable        POPLITEAL  not palpable   not palpable       POSTERIOR TIBIAL  BKA   not palpable        DORSALIS PEDIS      ANTERIOR TIBIAL BKA not palpable    Abdomen: soft, NT, no masses. Skin: no rashes, no ulcers noted, slightly darker skin color at base of right great toe, mild non-pitting swelling at lower half of right foot Musculoskeletal: no muscle wasting or atrophy. Left BKA.  Neurologic: A&O X 3; Appropriate Affect ; SENSATION: normal; MOTOR FUNCTION:  moving all extremities equally. Speech is fluent/normal. CN 2-12 grossly intact.   Non-Invasive Vascular Imaging: DATE: 08/02/2013 LOWER EXTREMITY ARTERIAL DUPLEX EVALUATION    INDICATION: Known peripheral vascular disease with increasing pain in right foot.    PREVIOUS INTERVENTION(S): Left BKA    DUPLEX EXAM:     RIGHT  LEFT   Peak Systolic Velocity (cm/s) Ratio (if abnormal) Waveform  Peak Systolic Velocity (cm/s) Ratio (if abnormal) Waveform  145; 264  M Common Femoral Artery     138  M Deep Femoral Artery     124  M Superficial Femoral Artery Proximal     28; NWV  M Superficial Femoral Artery Mid     NWV; 71   M Superficial Femoral Artery Distal     45  M Popliteal Artery     21  M  Posterior Tibial Artery Dist     15  M Anterior Tibial Artery Distal     22  M Peroneal Artery Distal      Today's ABI / TBI    Previous ABI / TBI (  )     Waveform:    M - Monophasic       B - Biphasic       T - Triphasic  If Ankle Brachial Index (ABI) or Toe Brachial Index (TBI) performed, please see complete report     ADDITIONAL FINDINGS: Ankle brachial index was not performed due to known non-compressible vessels.    IMPRESSION: Dense calcification is observed in the right lower extremity arteries with diffuse disease observed throughout the leg. Mid-distal superficial femoral artery is difficult to visualize and may be chronically occluded with reconstitution of the distal segment.    Compared to the previous exam:  No previous exam at this facility for comparison.     ASSESSMENT: [name] is a 80 y.o. female  who is s/p left below the knee amputation.   She is nonambulatory as she does not have a prosthesis for her left lower extremity. She denies any claudication symptoms in the right and has no history of nonhealing ulcers on the right.  She is not currently on dialysis.She had a left basilic vein transposition placed on 05/27/2011. She had a tunneled dialysis catheter placed but she states that it was only used 3 times. She has a fistula in her left upper arm which does not appear to be functional and is nonpulsatile and sclerotic.   Dr. Imogene Burnhen spoke with pt and family members, examined pt's legs. Her options are limited re the next step in the evaluation of her right leg arterial perfusion as she is in stage 4 or 5 of CRF and the IV contrast may damage what little renal function she has remaining.  Her arteries are densely calcified secondary to advanced CRF.  She dangles her right foot from her wheelchair which encourages  swelling, inhibits venous return, especially in the background of stage 4 or 5 CRF, which may exacerbate the pain of an existing gout flare. She will  return in 2-3 weeks to discuss her options for evaluation of her right LE arterial perfusion and for placement of viable HD access.  PLAN:  Elevate right leg to at least hip level to alleviate right foot swelling. Continue analgesics for pain control.  Based on the patient's vascular studies and examination, pt will return to clinic in 2-3 weeks with [name] to discuss possible HD access options and whether arteriogram is an option since she has severe CKD.  The patient was given information about PAD including signs, symptoms, treatment, what symptoms should prompt the patient to seek immediate medical care, and risk reduction measures to take.  Charisse March, RN, MSN, FNP-C [facility] Phone: [phone]  Clinic MD: Imogene Burn  08/02/2013 2:15 PM

## 2013-08-02 NOTE — Patient Instructions (Signed)
Peripheral Vascular Disease Peripheral Vascular Disease (PVD), also called Peripheral Arterial Disease (PAD), is a circulation problem caused by cholesterol (atherosclerotic plaque) deposits in the arteries. PVD commonly occurs in the lower extremities (legs) but it can occur in other areas of the body, such as your arms. The cholesterol buildup in the arteries reduces blood flow which can cause pain and other serious problems. The presence of PVD can place a person at risk for Coronary Artery Disease (CAD).  CAUSES  Causes of PVD can be many. It is usually associated with more than one risk factor such as:   High Cholesterol.  Smoking.  Diabetes.  Lack of exercise or inactivity.  High blood pressure (hypertension).  Obesity.  Family history. SYMPTOMS   When the lower extremities are affected, patients with PVD may experience:  Leg pain with exertion or physical activity. This is called INTERMITTENT CLAUDICATION. This may present as cramping or numbness with physical activity. The location of the pain is associated with the level of blockage. For example, blockage at the abdominal level (distal abdominal aorta) may result in buttock or hip pain. Lower leg arterial blockage may result in calf pain.  As PVD becomes more severe, pain can develop with less physical activity.  In people with severe PVD, leg pain may occur at rest.  Other PVD signs and symptoms:  Leg numbness or weakness.  Coldness in the affected leg or foot, especially when compared to the other leg.  A change in leg color.  Patients with significant PVD are more prone to ulcers or sores on toes, feet or legs. These may take longer to heal or may reoccur. The ulcers or sores can become infected.  If signs and symptoms of PVD are ignored, gangrene may occur. This can result in the loss of toes or loss of an entire limb.  Not all leg pain is related to PVD. Other medical conditions can cause leg pain such  as:  Blood clots (embolism) or Deep Vein Thrombosis.  Inflammation of the blood vessels (vasculitis).  Spinal stenosis. DIAGNOSIS  Diagnosis of PVD can involve several different types of tests. These can include:  Pulse Volume Recording Method (PVR). This test is simple, painless and does not involve the use of X-rays. PVR involves measuring and comparing the blood pressure in the arms and legs. An ABI (Ankle-Brachial Index) is calculated. The normal ratio of blood pressures is 1. As this number becomes smaller, it indicates more severe disease.  < 0.95  indicates significant narrowing in one or more leg vessels.  <0.8 there will usually be pain in the foot, leg or buttock with exercise.  <0.4 will usually have pain in the legs at rest.  <0.25  usually indicates limb threatening PVD.  Doppler detection of pulses in the legs. This test is painless and checks to see if you have a pulses in your legs/feet.  A dye or contrast material (a substance that highlights the blood vessels so they show up on x-ray) may be given to help your caregiver better see the arteries for the following tests. The dye is eliminated from your body by the kidney's. Your caregiver may order blood work to check your kidney function and other laboratory values before the following tests are performed:  Magnetic Resonance Angiography (MRA). An MRA is a picture study of the blood vessels and arteries. The MRA machine uses a large magnet to produce images of the blood vessels.  Computed Tomography Angiography (CTA). A CTA is a   specialized x-ray that looks at how the blood flows in your blood vessels. An IV may be inserted into your arm so contrast dye can be injected.  Angiogram. Is a procedure that uses x-rays to look at your blood vessels. This procedure is minimally invasive, meaning a small incision (cut) is made in your groin. A small tube (catheter) is then inserted into the artery of your groin. The catheter is  guided to the blood vessel or artery your caregiver wants to examine. Contrast dye is injected into the catheter. X-rays are then taken of the blood vessel or artery. After the images are obtained, the catheter is taken out. TREATMENT  Treatment of PVD involves many interventions which may include:  Lifestyle changes:  Quitting smoking.  Exercise.  Following a low fat, low cholesterol diet.  Control of diabetes.  Foot care is very important to the PVD patient. Good foot care can help prevent infection.  Medication:  Cholesterol-lowering medicine.  Blood pressure medicine.  Anti-platelet drugs.  Certain medicines may reduce symptoms of Intermittent Claudication.  Interventional/Surgical options:  Angioplasty. An Angioplasty is a procedure that inflates a balloon in the blocked artery. This opens the blocked artery to improve blood flow.  Stent Implant. A wire mesh tube (stent) is placed in the artery. The stent expands and stays in place, allowing the artery to remain open.  Peripheral Bypass Surgery. This is a surgical procedure that reroutes the blood around a blocked artery to help improve blood flow. This type of procedure may be performed if Angioplasty or stent implants are not an option. SEEK IMMEDIATE MEDICAL CARE IF:   You develop pain or numbness in your arms or legs.  Your arm or leg turns cold, becomes blue in color.  You develop redness, warmth, swelling and pain in your arms or legs. MAKE SURE YOU:   Understand these instructions.  Will watch your condition.  Will get help right away if you are not doing well or get worse. Document Released: 04/14/2004 Document Revised: 05/30/2011 Document Reviewed: 03/11/2008 [employer] Patient Information 2014 [employer].  

## 2013-08-15 ENCOUNTER — Inpatient Hospital Stay (HOSPITAL_COMMUNITY)
Admission: EM | Admit: 2013-08-15 | Discharge: 2013-08-23 | DRG: 291 | Disposition: A | Discharge status: Skilled Nursing Facility | Payer: Medicare Other | Attending: Internal Medicine | Admitting: Internal Medicine

## 2013-08-15 ENCOUNTER — Emergency Department (HOSPITAL_COMMUNITY): Payer: Medicare Other

## 2013-08-15 ENCOUNTER — Encounter (HOSPITAL_COMMUNITY): Payer: Self-pay | Admitting: Emergency Medicine

## 2013-08-15 DIAGNOSIS — I498 Other specified cardiac arrhythmias: Secondary | ICD-10-CM | POA: Diagnosis present

## 2013-08-15 DIAGNOSIS — Z794 Long term (current) use of insulin: Secondary | ICD-10-CM

## 2013-08-15 DIAGNOSIS — N039 Chronic nephritic syndrome with unspecified morphologic changes: Secondary | ICD-10-CM

## 2013-08-15 DIAGNOSIS — I5031 Acute diastolic (congestive) heart failure: Secondary | ICD-10-CM

## 2013-08-15 DIAGNOSIS — N184 Chronic kidney disease, stage 4 (severe): Secondary | ICD-10-CM

## 2013-08-15 DIAGNOSIS — E1159 Type 2 diabetes mellitus with other circulatory complications: Secondary | ICD-10-CM | POA: Diagnosis present

## 2013-08-15 DIAGNOSIS — R059 Cough, unspecified: Secondary | ICD-10-CM

## 2013-08-15 DIAGNOSIS — E1151 Type 2 diabetes mellitus with diabetic peripheral angiopathy without gangrene: Secondary | ICD-10-CM | POA: Diagnosis present

## 2013-08-15 DIAGNOSIS — N2889 Other specified disorders of kidney and ureter: Secondary | ICD-10-CM | POA: Diagnosis present

## 2013-08-15 DIAGNOSIS — N186 End stage renal disease: Secondary | ICD-10-CM | POA: Diagnosis present

## 2013-08-15 DIAGNOSIS — Z993 Dependence on wheelchair: Secondary | ICD-10-CM

## 2013-08-15 DIAGNOSIS — D631 Anemia in chronic kidney disease: Secondary | ICD-10-CM | POA: Diagnosis present

## 2013-08-15 DIAGNOSIS — R1310 Dysphagia, unspecified: Secondary | ICD-10-CM | POA: Diagnosis present

## 2013-08-15 DIAGNOSIS — E8809 Other disorders of plasma-protein metabolism, not elsewhere classified: Secondary | ICD-10-CM | POA: Diagnosis present

## 2013-08-15 DIAGNOSIS — I509 Heart failure, unspecified: Secondary | ICD-10-CM | POA: Diagnosis present

## 2013-08-15 DIAGNOSIS — I10 Essential (primary) hypertension: Secondary | ICD-10-CM

## 2013-08-15 DIAGNOSIS — Z79899 Other long term (current) drug therapy: Secondary | ICD-10-CM

## 2013-08-15 DIAGNOSIS — R7989 Other specified abnormal findings of blood chemistry: Secondary | ICD-10-CM

## 2013-08-15 DIAGNOSIS — Z9849 Cataract extraction status, unspecified eye: Secondary | ICD-10-CM

## 2013-08-15 DIAGNOSIS — I519 Heart disease, unspecified: Secondary | ICD-10-CM

## 2013-08-15 DIAGNOSIS — I428 Other cardiomyopathies: Secondary | ICD-10-CM | POA: Diagnosis present

## 2013-08-15 DIAGNOSIS — Z992 Dependence on renal dialysis: Secondary | ICD-10-CM

## 2013-08-15 DIAGNOSIS — E440 Moderate protein-calorie malnutrition: Secondary | ICD-10-CM | POA: Insufficient documentation

## 2013-08-15 DIAGNOSIS — I5042 Chronic combined systolic (congestive) and diastolic (congestive) heart failure: Secondary | ICD-10-CM | POA: Diagnosis present

## 2013-08-15 DIAGNOSIS — I252 Old myocardial infarction: Secondary | ICD-10-CM

## 2013-08-15 DIAGNOSIS — I739 Peripheral vascular disease, unspecified: Secondary | ICD-10-CM | POA: Diagnosis present

## 2013-08-15 DIAGNOSIS — J9601 Acute respiratory failure with hypoxia: Secondary | ICD-10-CM | POA: Diagnosis present

## 2013-08-15 DIAGNOSIS — Z7982 Long term (current) use of aspirin: Secondary | ICD-10-CM

## 2013-08-15 DIAGNOSIS — R0602 Shortness of breath: Secondary | ICD-10-CM

## 2013-08-15 DIAGNOSIS — I5033 Acute on chronic diastolic (congestive) heart failure: Principal | ICD-10-CM | POA: Diagnosis present

## 2013-08-15 DIAGNOSIS — Z7902 Long term (current) use of antithrombotics/antiplatelets: Secondary | ICD-10-CM

## 2013-08-15 DIAGNOSIS — R0902 Hypoxemia: Secondary | ICD-10-CM

## 2013-08-15 DIAGNOSIS — J9600 Acute respiratory failure, unspecified whether with hypoxia or hypercapnia: Secondary | ICD-10-CM | POA: Diagnosis present

## 2013-08-15 DIAGNOSIS — R05 Cough: Secondary | ICD-10-CM

## 2013-08-15 DIAGNOSIS — Z87891 Personal history of nicotine dependence: Secondary | ICD-10-CM

## 2013-08-15 DIAGNOSIS — S88119A Complete traumatic amputation at level between knee and ankle, unspecified lower leg, initial encounter: Secondary | ICD-10-CM

## 2013-08-15 DIAGNOSIS — K219 Gastro-esophageal reflux disease without esophagitis: Secondary | ICD-10-CM | POA: Diagnosis present

## 2013-08-15 DIAGNOSIS — IMO0002 Reserved for concepts with insufficient information to code with codable children: Secondary | ICD-10-CM

## 2013-08-15 DIAGNOSIS — N19 Unspecified kidney failure: Secondary | ICD-10-CM

## 2013-08-15 DIAGNOSIS — E875 Hyperkalemia: Secondary | ICD-10-CM | POA: Diagnosis present

## 2013-08-15 DIAGNOSIS — I120 Hypertensive chronic kidney disease with stage 5 chronic kidney disease or end stage renal disease: Secondary | ICD-10-CM | POA: Diagnosis present

## 2013-08-15 DIAGNOSIS — D638 Anemia in other chronic diseases classified elsewhere: Secondary | ICD-10-CM | POA: Diagnosis present

## 2013-08-15 DIAGNOSIS — E872 Acidosis, unspecified: Secondary | ICD-10-CM | POA: Diagnosis present

## 2013-08-15 DIAGNOSIS — E785 Hyperlipidemia, unspecified: Secondary | ICD-10-CM | POA: Diagnosis present

## 2013-08-15 DIAGNOSIS — M109 Gout, unspecified: Secondary | ICD-10-CM | POA: Diagnosis present

## 2013-08-15 DIAGNOSIS — E038 Other specified hypothyroidism: Secondary | ICD-10-CM | POA: Diagnosis present

## 2013-08-15 DIAGNOSIS — I798 Other disorders of arteries, arterioles and capillaries in diseases classified elsewhere: Secondary | ICD-10-CM | POA: Diagnosis present

## 2013-08-15 DIAGNOSIS — K5900 Constipation, unspecified: Secondary | ICD-10-CM | POA: Diagnosis present

## 2013-08-15 DIAGNOSIS — I2510 Atherosclerotic heart disease of native coronary artery without angina pectoris: Secondary | ICD-10-CM | POA: Diagnosis present

## 2013-08-15 DIAGNOSIS — Z8673 Personal history of transient ischemic attack (TIA), and cerebral infarction without residual deficits: Secondary | ICD-10-CM

## 2013-08-15 DIAGNOSIS — E039 Hypothyroidism, unspecified: Secondary | ICD-10-CM | POA: Diagnosis present

## 2013-08-15 HISTORY — DX: Chronic diastolic (congestive) heart failure: I50.32

## 2013-08-15 LAB — COMPREHENSIVE METABOLIC PANEL
ALBUMIN: 3.1 g/dL — AB (ref 3.5–5.2)
ALK PHOS: 83 U/L (ref 39–117)
ALT: 20 U/L (ref 0–35)
ALT: 20 U/L (ref 0–35)
AST: 23 U/L (ref 0–37)
AST: 29 U/L (ref 0–37)
Albumin: 3 g/dL — ABNORMAL LOW (ref 3.5–5.2)
Alkaline Phosphatase: 86 U/L (ref 39–117)
BILIRUBIN TOTAL: 0.5 mg/dL (ref 0.3–1.2)
BUN: 79 mg/dL — ABNORMAL HIGH (ref 6–23)
BUN: 79 mg/dL — ABNORMAL HIGH (ref 6–23)
CALCIUM: 8.7 mg/dL (ref 8.4–10.5)
CHLORIDE: 100 meq/L (ref 96–112)
CO2: 20 mEq/L (ref 19–32)
CO2: 18 mEq/L — ABNORMAL LOW (ref 19–32)
Calcium: 8.7 mg/dL (ref 8.4–10.5)
Chloride: 100 mEq/L (ref 96–112)
Creatinine, Ser: 2.96 mg/dL — ABNORMAL HIGH (ref 0.50–1.10)
Creatinine, Ser: 3.04 mg/dL — ABNORMAL HIGH (ref 0.50–1.10)
GFR calc Af Amer: 15 mL/min — ABNORMAL LOW (ref >90)
GFR calc Af Amer: 15 mL/min — ABNORMAL LOW (ref >90)
GFR calc non Af Amer: 13 mL/min — ABNORMAL LOW (ref >90)
GFR calc non Af Amer: 13 mL/min — ABNORMAL LOW (ref >90)
GLUCOSE: 128 mg/dL — AB (ref 70–99)
Glucose, Bld: 150 mg/dL — ABNORMAL HIGH (ref 70–99)
POTASSIUM: 5.6 meq/L — AB (ref 3.7–5.3)
Potassium: 5.7 mEq/L — ABNORMAL HIGH (ref 3.7–5.3)
SODIUM: 137 meq/L (ref 137–147)
Sodium: 136 mEq/L — ABNORMAL LOW (ref 137–147)
TOTAL PROTEIN: 7.1 g/dL (ref 6.0–8.3)
Total Bilirubin: 0.6 mg/dL (ref 0.3–1.2)
Total Protein: 7.4 g/dL (ref 6.0–8.3)

## 2013-08-15 LAB — CBC WITH DIFFERENTIAL/PLATELET
Basophils Absolute: 0 10*3/uL (ref 0.0–0.1)
Basophils Relative: 0 % (ref 0–1)
EOS ABS: 0.1 10*3/uL (ref 0.0–0.7)
Eosinophils Relative: 1 % (ref 0–5)
HCT: 27.5 % — ABNORMAL LOW (ref 36.0–46.0)
HEMOGLOBIN: 8.6 g/dL — AB (ref 12.0–15.0)
Lymphocytes Relative: 15 % (ref 12–46)
Lymphs Abs: 1.5 10*3/uL (ref 0.7–4.0)
MCH: 26.8 pg (ref 26.0–34.0)
MCHC: 31.3 g/dL (ref 30.0–36.0)
MCV: 85.7 fL (ref 78.0–100.0)
Monocytes Absolute: 0.8 10*3/uL (ref 0.1–1.0)
Monocytes Relative: 7 % (ref 3–12)
NEUTROS ABS: 8.1 10*3/uL — AB (ref 1.7–7.7)
NEUTROS PCT: 77 % (ref 43–77)
PLATELETS: 357 10*3/uL (ref 150–400)
RBC: 3.21 MIL/uL — ABNORMAL LOW (ref 3.87–5.11)
RDW: 19.2 % — ABNORMAL HIGH (ref 11.5–15.5)
WBC: 10.5 10*3/uL (ref 4.0–10.5)

## 2013-08-15 LAB — PRO B NATRIURETIC PEPTIDE
PRO B NATRI PEPTIDE: 30760 pg/mL — AB (ref 0–450)
Pro B Natriuretic peptide (BNP): 31926 pg/mL — ABNORMAL HIGH (ref 0–450)

## 2013-08-15 LAB — I-STAT TROPONIN, ED: Troponin i, poc: 0.05 ng/mL (ref 0.00–0.08)

## 2013-08-15 LAB — MAGNESIUM: Magnesium: 2.6 mg/dL — ABNORMAL HIGH (ref 1.5–2.5)

## 2013-08-15 LAB — I-STAT CG4 LACTIC ACID, ED: Lactic Acid, Venous: 0.89 mmol/L (ref 0.5–2.2)

## 2013-08-15 LAB — TSH: TSH: 0.152 u[IU]/mL — AB (ref 0.350–4.500)

## 2013-08-15 LAB — GLUCOSE, CAPILLARY: Glucose-Capillary: 178 mg/dL — ABNORMAL HIGH (ref 70–99)

## 2013-08-15 MED ORDER — LATANOPROST 0.005 % OP SOLN
1.0000 [drp] | Freq: Every day | OPHTHALMIC | Status: DC
  Administered 2013-08-15 – 2013-08-22 (×8): 1 [drp] via OPHTHALMIC
  Filled 2013-08-15: qty 2.5

## 2013-08-15 MED ORDER — SODIUM CHLORIDE 0.9 % IJ SOLN
3.0000 mL | INTRAMUSCULAR | Status: DC | PRN
  Administered 2013-08-17: 3 mL via INTRAVENOUS

## 2013-08-15 MED ORDER — SODIUM CHLORIDE 0.9 % IV SOLN
250.0000 mL | INTRAVENOUS | Status: DC | PRN
Start: 2013-08-15 — End: 2013-08-23
  Administered 2013-08-21: 250 mL via INTRAVENOUS

## 2013-08-15 MED ORDER — POLYETHYLENE GLYCOL 3350 17 G PO PACK
17.0000 g | PACK | Freq: Every day | ORAL | Status: DC | PRN
  Filled 2013-08-15: qty 1

## 2013-08-15 MED ORDER — CALCIUM ACETATE 667 MG PO CAPS
667.0000 mg | ORAL_CAPSULE | Freq: Three times a day (TID) | ORAL | Status: DC
  Administered 2013-08-16 – 2013-08-23 (×23): 667 mg via ORAL
  Filled 2013-08-15 (×25): qty 1

## 2013-08-15 MED ORDER — ASPIRIN EC 81 MG PO TBEC
81.0000 mg | DELAYED_RELEASE_TABLET | Freq: Every day | ORAL | Status: DC
  Administered 2013-08-16 – 2013-08-23 (×8): 81 mg via ORAL
  Filled 2013-08-15 (×8): qty 1

## 2013-08-15 MED ORDER — HYDRALAZINE HCL 25 MG PO TABS
25.0000 mg | ORAL_TABLET | Freq: Three times a day (TID) | ORAL | Status: DC
  Administered 2013-08-15 – 2013-08-18 (×8): 25 mg via ORAL
  Filled 2013-08-15 (×11): qty 1

## 2013-08-15 MED ORDER — SIMVASTATIN 20 MG PO TABS
20.0000 mg | ORAL_TABLET | Freq: Every day | ORAL | Status: DC
Start: 2013-08-16 — End: 2013-08-23
  Administered 2013-08-16 – 2013-08-22 (×7): 20 mg via ORAL
  Filled 2013-08-15 (×8): qty 1

## 2013-08-15 MED ORDER — LINAGLIPTIN 5 MG PO TABS
5.0000 mg | ORAL_TABLET | Freq: Every day | ORAL | Status: DC
  Administered 2013-08-16 – 2013-08-23 (×8): 5 mg via ORAL
  Filled 2013-08-15 (×8): qty 1

## 2013-08-15 MED ORDER — HYDROCODONE-ACETAMINOPHEN 5-325 MG PO TABS
1.0000 | ORAL_TABLET | Freq: Two times a day (BID) | ORAL | Status: DC | PRN
  Administered 2013-08-16: 1 via ORAL
  Filled 2013-08-15: qty 1

## 2013-08-15 MED ORDER — CALCITRIOL 0.25 MCG PO CAPS
0.2500 ug | ORAL_CAPSULE | Freq: Every day | ORAL | Status: DC
  Administered 2013-08-15 – 2013-08-22 (×8): 0.25 ug via ORAL
  Filled 2013-08-15 (×9): qty 1

## 2013-08-15 MED ORDER — NORTRIPTYLINE HCL 25 MG PO CAPS
50.0000 mg | ORAL_CAPSULE | Freq: Every day | ORAL | Status: DC
  Administered 2013-08-15 – 2013-08-22 (×8): 50 mg via ORAL
  Filled 2013-08-15 (×11): qty 2

## 2013-08-15 MED ORDER — NORTRIPTYLINE HCL 25 MG PO CAPS: 50.0000 mg | ORAL_CAPSULE | Freq: Every day | ORAL | Status: DC

## 2013-08-15 MED ORDER — AMLODIPINE BESYLATE 5 MG PO TABS
5.0000 mg | ORAL_TABLET | Freq: Every day | ORAL | Status: DC
  Administered 2013-08-15 – 2013-08-18 (×4): 5 mg via ORAL
  Filled 2013-08-15 (×4): qty 1

## 2013-08-15 MED ORDER — CARVEDILOL 6.25 MG PO TABS
6.2500 mg | ORAL_TABLET | Freq: Two times a day (BID) | ORAL | Status: DC
  Administered 2013-08-16: 6.25 mg via ORAL
  Filled 2013-08-15 (×3): qty 1

## 2013-08-15 MED ORDER — FUROSEMIDE 10 MG/ML IJ SOLN
20.0000 mg | Freq: Once | INTRAMUSCULAR | Status: AC
  Administered 2013-08-15: 20 mg via INTRAVENOUS
  Filled 2013-08-15: qty 2

## 2013-08-15 MED ORDER — SODIUM CHLORIDE 0.9 % IJ SOLN
3.0000 mL | Freq: Two times a day (BID) | INTRAMUSCULAR | Status: DC
  Administered 2013-08-15 – 2013-08-23 (×16): 3 mL via INTRAVENOUS

## 2013-08-15 MED ORDER — NITROGLYCERIN 0.4 MG SL SUBL: 0.4000 mg | SUBLINGUAL_TABLET | SUBLINGUAL | Status: DC | PRN

## 2013-08-15 MED ORDER — GABAPENTIN 300 MG PO CAPS
300.0000 mg | ORAL_CAPSULE | Freq: Three times a day (TID) | ORAL | Status: DC
  Administered 2013-08-15 – 2013-08-23 (×23): 300 mg via ORAL
  Filled 2013-08-15 (×25): qty 1

## 2013-08-15 MED ORDER — ACETAMINOPHEN 325 MG PO TABS: 650.0000 mg | ORAL_TABLET | Freq: Four times a day (QID) | ORAL | Status: DC | PRN

## 2013-08-15 MED ORDER — CLOPIDOGREL BISULFATE 75 MG PO TABS
75.0000 mg | ORAL_TABLET | Freq: Every day | ORAL | Status: DC
  Administered 2013-08-16 – 2013-08-23 (×8): 75 mg via ORAL
  Filled 2013-08-15 (×9): qty 1

## 2013-08-15 MED ORDER — RENA-VITE PO TABS
1.0000 | ORAL_TABLET | Freq: Every day | ORAL | Status: DC
  Administered 2013-08-15 – 2013-08-22 (×8): 1 via ORAL
  Filled 2013-08-15 (×9): qty 1

## 2013-08-15 MED ORDER — ISOSORBIDE MONONITRATE ER 60 MG PO TB24
60.0000 mg | ORAL_TABLET | Freq: Every day | ORAL | Status: DC
  Administered 2013-08-16 – 2013-08-23 (×8): 60 mg via ORAL
  Filled 2013-08-15 (×8): qty 1

## 2013-08-15 MED ORDER — LEVOTHYROXINE SODIUM 75 MCG PO TABS
75.0000 ug | ORAL_TABLET | Freq: Every day | ORAL | Status: DC
  Administered 2013-08-16 – 2013-08-23 (×8): 75 ug via ORAL
  Filled 2013-08-15 (×10): qty 1

## 2013-08-15 MED ORDER — TRAVOPROST 0.004 % OP SOLN: 1.0000 [drp] | Freq: Every day | OPHTHALMIC | Status: DC

## 2013-08-15 MED ORDER — INSULIN GLARGINE 100 UNIT/ML ~~LOC~~ SOLN
36.0000 [IU] | Freq: Every day | SUBCUTANEOUS | Status: DC
  Administered 2013-08-15 – 2013-08-20 (×6): 36 [IU] via SUBCUTANEOUS
  Filled 2013-08-15 (×9): qty 0.36

## 2013-08-15 MED ORDER — ALLOPURINOL 100 MG PO TABS
100.0000 mg | ORAL_TABLET | Freq: Every day | ORAL | Status: DC
  Administered 2013-08-15 – 2013-08-23 (×9): 100 mg via ORAL
  Filled 2013-08-15 (×9): qty 1

## 2013-08-15 MED ORDER — SEVELAMER CARBONATE 800 MG PO TABS
800.0000 mg | ORAL_TABLET | Freq: Three times a day (TID) | ORAL | Status: DC
  Administered 2013-08-16 – 2013-08-23 (×23): 800 mg via ORAL
  Filled 2013-08-15 (×27): qty 1

## 2013-08-15 NOTE — ED Notes (Signed)
Pt brought to ED by EMS, from home pt c/o sob since last night that progressively getting worse today, pt refers that SOB get worse at rest. Pt AO x4. O2 saturation 87% on RA, 92% on 2L [place] and 100% on the NRM. EKG done by EMS, accelerated junctional.

## 2013-08-15 NOTE — ED Provider Notes (Signed)
CSN: [id number]     Arrival date & time 08/15/13  1408 History   First MD Initiated Contact with Patient 08/15/13 1411     Chief Complaint  Patient presents with  . Shortness of Breath       (Consider location/radiation/quality/duration/timing/severity/associated sxs/prior Treatment) Patient is a 80 y.o. female presenting with shortness of breath. The history is provided by the patient and medical records. No language interpreter was used.  Shortness of Breath Severity:  Severe Onset quality:  Gradual Duration:  3 days Timing:  Constant Progression:  Worsening Chronicity:  Recurrent Context: activity   Relieved by:  Nothing Worsened by:  Activity and movement (laying flat) Ineffective treatments:  Diuretics Associated symptoms: cough (non productive) and PND   Associated symptoms: no abdominal pain, no chest pain, no fever, no headaches, no hemoptysis and no sputum production   Risk factors: no prolonged immobilization and no recent surgery     Past Medical History  Diagnosis Date  . Coronary artery disease   . TIA (transient ischemic attack)   . Hypertension   . Shingles   . Stroke   . Anemia   . CAD (coronary artery disease) 02/08/2011  . Cardiomyopathy, idiopathic 02/08/2011  . Chronic kidney disease   . Angina     Takes Isosorbide  . Unstable angina 02/08/2011  . Myocardial infarct     x 3 unsure of years  . Irregular heartbeat   . Ulcer   . GERD (gastroesophageal reflux disease)     takes Protonix and zantac  . Hx of transient ischemic attack (TIA)   . Memory loss   . CHF (congestive heart failure)     Takes Lasix  . Gout     takes allopurinol  . Peripheral vascular disease     left lower  leg  . Diabetes mellitus     type 2 NIDDM x 2 years; no meds  . Chronic kidney disease (CKD), stage IV (severe)   . Arthritis   . Shortness of breath   . Thyroid disease   . Hypothyroidism     (SEVERE) Takes Levothryroxine  . Hypothyroid 02/08/2011  .  Nonischemic cardiomyopathy     EF now is 55%, reduced due to myxedema, which is improved.  . Dyslipidemia   . Peripheral neuropathy   . H/O echocardiogram 09/06/11    Indication- nonIschemic Cardiomyopathy. EF = now greater than 55% with no regional wall motion abnormalities. Tthere is mild to moderate trisuspid regurgitayion and mild pulmonary hypertension with an RVSP of 35 mmHg as well as stage 1 diastolic dysfunction and mild to moderate LVH.  Marland Kitchen. Abnormal nuclear stress test 06/01/09    Demonstrated a new area of infarct scar, peri-infarct ischemia seen in the inferolateral territory. EF eas normal at 70% with mild hypocontractility at the apex, distal inferolateral wall.   Past Surgical History  Procedure Laterality Date  . [facility]  . Eye surgery      Left eye surgery; cataract removal  . Left bka  07/05/2011  . Av fistula placement    . Amputation  07/05/2011    Procedure: AMPUTATION BELOW KNEE;  Surgeon: Chuck Hinthristopher S Dickson, MD;  [facility];  Service: Vascular;  Laterality: Left;  . Angioplasty  1988  . Cardiac catheterization  09/28/07    Demonstrated multiple sequential lesions around 40 to 30% in the RCA territory.  . Left lower extremity venous duplex Left 06/27/11  Summary: No evidence of DVT involving the left lower extremity and right common femoral vein.   . Lower extremity arterial evaluation  06/27/11    SUMMARY: Right: ABI not ascertained due to false elevation in BP secondary to calcification (posterior tibial artery is non compressible). Left: ABI indicates moderate reduction in arterial flow. Bilateral: Great toe PPG waveforms indicate adequate perfusion. Great toe pressures not obtained due to patient's movements secondary to pain.  . Duplex doppler  05/10/11    LE arterial dopplers demonstrate bilaterally reduced ABIs of 0.91 on right & 0.56 on left. She does report some decreased pain on the left, & there's moderate mixed-density plaque  in the right SFA w/50 to 69% reduction. There's a 69% reduction in the left SFA & does appear to be occlusive disease of left posterior tibial artery. Right posterior dorsalis pedis artery demonstrates occlusive disease  . Insertion of dialysis catheter N/A 01/06/2013    Procedure: INSERTION OF DIALYSIS CATHETER;  Surgeon: Larina Earthly, MD;  Location: [facility] OR;  Service: Vascular;  Laterality: N/A;   Family History  Problem Relation Age of Onset  . Cancer Sister     STOMACH  . Diabetes Sister   . Cancer Brother     BONE  . Diabetes Brother   . Anesthesia problems Neg Hx   . Hypotension Neg Hx   . Malignant hyperthermia Neg Hx   . Pseudochol deficiency Neg Hx   . Hyperlipidemia Daughter   . Hypertension Daughter   . Heart disease Daughter     before age 73  . Kidney disease Daughter   . Other Daughter     varicose veins  . Diabetes Daughter   . Heart disease Son     before age 30  . Hyperlipidemia Son   . Hypertension Son   . Heart attack Son   . Heart attack Daughter   . Heart disease Daughter     Before age 78   History  Substance Use Topics  . Smoking status: Former Smoker -- 0.50 packs/day for 40 years    Quit date: 07/05/1986  . Smokeless tobacco: Never Used     Comment: quit smoking 1988  . Alcohol Use: No   OB History   Grav Para Term Preterm Abortions TAB SAB Ect Mult Living                 Review of Systems  Constitutional: Negative for fever.  Respiratory: Positive for cough (non productive) and shortness of breath. Negative for hemoptysis and sputum production.   Cardiovascular: Positive for PND. Negative for chest pain.  Gastrointestinal: Negative for abdominal pain.  Neurological: Negative for headaches.  All other systems reviewed and are negative.     Allergies  Aspirin  Home Medications   Prior to Admission medications   Medication Sig Start Date End Date Taking? Authorizing Provider  acetaminophen (TYLENOL) 325 MG tablet Take 2 tablets (650  mg total) by mouth every 4 (four) hours as needed. 01/15/13   Osvaldo Shipper, MD  allopurinol (ZYLOPRIM) 100 MG tablet Take 100 mg by mouth daily.     Historical Provider, MD  amLODipine (NORVASC) 10 MG tablet Take 5 mg by mouth daily.     Historical Provider, MD  aspirin EC 81 MG tablet Take 81 mg by mouth daily.    Historical Provider, MD  calcitRIOL (ROCALTROL) 0.25 MCG capsule Take 0.25 mcg by mouth daily.  11/04/12   Historical Provider, MD  calcium acetate (PHOSLO) 667  MG capsule Take 1 capsule (667 mg total) by mouth 3 (three) times daily with meals. 01/15/13   Osvaldo Shipper, MD  carvedilol (COREG) 6.25 MG tablet Take 1 tablet (6.25 mg total) by mouth 2 (two) times daily with a meal. 07/22/13   Chrystie Nose, MD  clopidogrel (PLAVIX) 75 MG tablet Take 75 mg by mouth daily.    Historical Provider, MD  furosemide (LASIX) 80 MG tablet Take 1 tablet (80 mg total) by mouth 2 (two) times daily. 06/14/13   Chrystie Nose, MD  gabapentin (NEURONTIN) 300 MG capsule Take 1 capsule (300 mg total) by mouth at bedtime. 01/15/13   Osvaldo Shipper, MD  hydrALAZINE (APRESOLINE) 25 MG tablet Take 1 tablet (25 mg total) by mouth every 8 (eight) hours. 01/15/13   Osvaldo Shipper, MD  HYDROcodone-acetaminophen (NORCO/VICODIN) 5-325 MG per tablet as needed. 07/31/13   Historical Provider, MD  insulin glargine (LANTUS) 100 UNIT/ML injection Inject 36 Units into the skin daily. 01/15/13   Osvaldo Shipper, MD  isosorbide mononitrate (IMDUR) 60 MG 24 hr tablet Take 60 mg by mouth daily.     Historical Provider, MD  levofloxacin (LEVAQUIN) 750 MG tablet Take 1 tablet (750 mg total) by mouth every other day. 06/17/13   Maryann Mikhail, DO  levothyroxine (SYNTHROID, LEVOTHROID) 75 MCG tablet Take 75 mcg by mouth daily before breakfast.    Historical Provider, MD  multivitamin (RENA-VIT) TABS tablet Take 1 tablet by mouth at bedtime. 01/15/13   Osvaldo Shipper, MD  nitroGLYCERIN (NITROSTAT) 0.4 MG SL tablet Place 0.4 mg  under the tongue every 5 (five) minutes as needed for chest pain.     Historical Provider, MD  nortriptyline (PAMELOR) 50 MG capsule Take 50 mg by mouth daily.  05/30/13   Historical Provider, MD  ONGLYZA 5 MG TABS tablet Take 5 mg by mouth daily.  11/22/12   Historical Provider, MD  polyethylene glycol (MIRALAX / GLYCOLAX) packet Take 17 g by mouth daily. 01/15/13   Osvaldo Shipper, MD  pravastatin (PRAVACHOL) 40 MG tablet Take 40 mg by mouth daily.    Historical Provider, MD  travoprost, benzalkonium, (TRAVATAN) 0.004 % ophthalmic solution Place 1 drop into both eyes at bedtime.     Historical Provider, MD   There were no vitals taken for this visit. Physical Exam  Nursing note and vitals reviewed. Constitutional: She is oriented to person, place, and time. She appears well-developed and well-nourished.  HENT:  Head: Normocephalic and atraumatic.  Right Ear: External ear normal.  Left Ear: External ear normal.  Nose: Nose normal.  Eyes: Conjunctivae and EOM are normal. Pupils are equal, round, and reactive to light.  Neck: Normal range of motion. Neck supple. JVD present. No tracheal deviation present. No thyromegaly present.  Cardiovascular: Normal rate, regular rhythm and normal heart sounds.   Pulmonary/Chest: Effort normal. She has rales (diffuse). She exhibits no tenderness.  Abdominal: Soft. Bowel sounds are normal. She exhibits no distension. There is no tenderness.  Musculoskeletal: Normal range of motion. She exhibits edema (1+ pitting edema to right LE; AKA present on LLE.  ).  Lymphadenopathy:    She has no cervical adenopathy.  Neurological: She is alert and oriented to person, place, and time. She has normal reflexes.  Skin: Skin is warm and dry.  Psychiatric: She has a normal mood and affect.    ED Course  Procedures (including critical care time) Labs Review Labs Reviewed  CBC WITH DIFFERENTIAL - Abnormal; Notable for the following:  RBC 3.21 (*)    Hemoglobin 8.6  (*)    HCT 27.5 (*)    RDW 19.2 (*)    Neutro Abs 8.1 (*)    All other components within normal limits  COMPREHENSIVE METABOLIC PANEL - Abnormal; Notable for the following:    Sodium 136 (*)    Potassium 5.6 (*)    Glucose, Bld 150 (*)    BUN 79 (*)    Creatinine, Ser 2.96 (*)    Albumin 3.1 (*)    GFR calc non Af Amer 13 (*)    GFR calc Af Amer 15 (*)    All other components within normal limits  PRO B NATRIURETIC PEPTIDE - Abnormal; Notable for the following:    Pro B Natriuretic peptide (BNP) 30760.0 (*)    All other components within normal limits  I-STAT CG4 LACTIC ACID, ED  Rosezena Sensor, ED    Imaging Review Dg Chest Port 1 View  08/15/2013   CLINICAL DATA:  Short of breath  EXAM: PORTABLE CHEST - 1 VIEW  COMPARISON:  Radiograph 06/17/2013  FINDINGS: Normal mediastinum and cardiac silhouette. Normal pulmonary vasculature. No effusion, infiltrate, pneumothorax. Mild left basilar atelectasis.  IMPRESSION: Mild left basilar atelectasis.  No pulmonary edema.   Electronically Signed   By: Genevive Bi M.D.   On: 08/15/2013 15:48     EKG Interpretation None       Date: 08/15/2013  Rate: 70  Rhythm: normal sinus rhythm  QRS Axis: normal  Intervals: normal  ST/T Wave abnormalities: nonspecific T wave changes  Conduction Disutrbances:nonspecific intraventricular conduction delay  Narrative Interpretation:   Old EKG Reviewed: unchanged    MDM   Final diagnoses:  SOB (shortness of breath)  Hypoxia  Cough  Elevated brain natriuretic peptide (BNP) level  CHF exacerbation  Renal failure    Patient with h/o CHF, HTN, CAD, renal failure, and CVA who presents to the ED with complaints of worsening SOB and diffuse swelling.  Patient was AF and VS remarkable for hypoxia on RA.  On 2 L patient noted to have O2 sats in the mid 90s.  PE as above and remarkable for crackles on lung exam and pitting edema on RLE.  Concern for CHF exacerbation, PNA, ACS, versus worsening  renal failure.  Labs notable for extremely high BNP, acute on chronic RF, and CXR with bilateral effusions.  Patient given IV lasix 20 for volume overload and hospitalist consulted for admission due to concern for CHF exacerbation and need for further treatment/monitoring.     Johnney Ou, MD 08/15/13 2114

## 2013-08-15 NOTE — H&P (Signed)
[facility] History and Physical  Shannon SirenRoberta G [name] ZOX:[id number] DOB: 01/26/1924 DOA: 08/15/2013  Referring physician: ER physician PCP: Georgianne FickAMACHANDRAN,AJITH, MD   Chief Complaint: shortness of breath   HPI:  80 year old female with past medical history significant for diastolic CHF, last 2 D ECHO in 2014 showed EF 65%, left BKA, hypothyroidism, CKD stage IV, dyslipidemia who [facility] ED 08/15/2013 with worsening shortness of breath started about few days prior to this admission and getting worse in past 24 hours associated with nonproductive cough. Pt reported feeling short of breath with rest and with exertion. She is on lasix at home and is compliant with meds but reports no significant symptomatic relief. SHe did not have chest pain, palpitations, fevers or chills. No abdominal pain, nausea or vomiting. No reports of diarrhea, blood in stool or urine. No loss of consciousness.  In ED, BP was 127/49, HR 61-70, T max 98.8 F and oxygen saturation 87% on room air. This has improved to 92% with 2 L [place] oxygen support. CXR showed left basilar atelectasis but pulmonary edema. She was given 20 mg IV lasix in ED. Her BNP was in 30,000 range and baseline is around [id number] range.   Assessment and Plan:  Principal Problem:   Acute respiratory failure with hypoxia  Secondary to acute diastolic CHF exacerbation  Given lasix in ED 20 mg IV and 1 more dose ordered afterwards  Daily weight and strict intake and output  She may continue her home meds: coreg, imdur, hydralazine, Norvasc, aspirin and plavix  Replete electrolytes as needed. Potassium was 5.6 on admission and she received lasix so that may get potassium to normal range  - oxygen support via nasal canula to keep O2 saturation above 90%  Admission to telemetry unit Active Problems:   Acute diastolic CHF (congestive heart failure)  Management as above with lasix 20 mg IV but given so far 2 doses due to renal  insufficiency  Continue coreg  Daily weight and strict intake and output  Replete electrolytes as need  Obtain 2 D ECHO  Repeat BNP in am   CAD - moderate at cath July 2012 (medical Rx)  Continue aspirin and plavix   Hypothyroidism  Continue levothyroxine   Peripheral vascular disease  Status post left BKA  Continue aspirin and plavix   Dyslipidemia  Continue statin therapy    Chronic renal insufficiency, stage IV (severe)- HD started 01/05/13  Please inform renal of pt admission  Continue calcitrol, phoslo and renvela   Anemia of chronic disease  Secondary to CKD  Hemoglobin stable  No current indications for transfusion    Hyperkalemia  Given lasix 20 mg IV x 2 doses  follow up BMP in am   DVT prophylaxis: SCD's bilaterally, aspirin and plavix  Radiological Exams on Admission: Dg Chest Port 1 View  08/15/2013    IMPRESSION: Mild left basilar atelectasis.  No pulmonary edema.      EKG: no acute ischemic changes identified on 12 lead EKG  Code Status: Full Family Communication: Plan of care discussed with the patient  Disposition Plan: Admit for further evaluation  Alison MurrayAlma M Quinntin Malter, MD  [facility] Pager [phone]  Review of Systems:  Constitutional: Negative for fever, chills and malaise/fatigue. Negative for diaphoresis.  HENT: Negative for hearing loss, ear pain, nosebleeds, congestion, sore throat, neck pain, tinnitus and ear discharge.   Eyes: Negative for blurred vision, double vision, photophobia, pain, discharge and redness.  Respiratory: per HPI.   Cardiovascular:  Negative for chest pain, palpitations, orthopnea, claudication and leg swelling.  Gastrointestinal: Negative for nausea, vomiting and abdominal pain. Negative for heartburn, constipation, blood in stool and melena.  Genitourinary: Negative for dysuria, urgency, frequency, hematuria and flank pain.  Musculoskeletal: Negative for myalgias, back pain, joint pain and falls.  Skin:  Negative for itching and rash.  Neurological: Negative for dizziness and weakness. Negative for tingling, tremors, sensory change, speech change, focal weakness, loss of consciousness and headaches.  Endo/Heme/Allergies: Negative for environmental allergies and polydipsia. Does not bruise/bleed easily.  Psychiatric/Behavioral: Negative for suicidal ideas. The patient is not nervous/anxious.      Past Medical History  Diagnosis Date  . Coronary artery disease   . TIA (transient ischemic attack)   . Hypertension   . Shingles   . Stroke   . Anemia   . CAD (coronary artery disease) 02/08/2011  . Cardiomyopathy, idiopathic 02/08/2011  . Chronic kidney disease   . Angina     Takes Isosorbide  . Unstable angina 02/08/2011  . Myocardial infarct     x 3 unsure of years  . Irregular heartbeat   . Ulcer   . GERD (gastroesophageal reflux disease)     takes Protonix and zantac  . Hx of transient ischemic attack (TIA)   . Memory loss   . CHF (congestive heart failure)     Takes Lasix  . Gout     takes allopurinol  . Peripheral vascular disease     left lower  leg  . Diabetes mellitus     type 2 NIDDM x 2 years; no meds  . Chronic kidney disease (CKD), stage IV (severe)   . Arthritis   . Shortness of breath   . Thyroid disease   . Hypothyroidism     (SEVERE) Takes Levothryroxine  . Hypothyroid 02/08/2011  . Nonischemic cardiomyopathy     EF now is 55%, reduced due to myxedema, which is improved.  . Dyslipidemia   . Peripheral neuropathy   . H/O echocardiogram 09/06/11    Indication- nonIschemic Cardiomyopathy. EF = now greater than 55% with no regional wall motion abnormalities. Tthere is mild to moderate trisuspid regurgitayion and mild pulmonary hypertension with an RVSP of 35 mmHg as well as stage 1 diastolic dysfunction and mild to moderate LVH.  Marland Kitchen Abnormal nuclear stress test 06/01/09    Demonstrated a new area of infarct scar, peri-infarct ischemia seen in the inferolateral  territory. EF eas normal at 70% with mild hypocontractility at the apex, distal inferolateral wall.   Past Surgical History  Procedure Laterality Date  . Back surgery      [facility]  . Eye surgery      Left eye surgery; cataract removal  . Left bka  07/05/2011  . Av fistula placement    . Amputation  07/05/2011    Procedure: AMPUTATION BELOW KNEE;  Surgeon: Chuck Hint, MD;  Location: [facility] OR;  Service: Vascular;  Laterality: Left;  . Angioplasty  1988  . Cardiac catheterization  09/28/07    Demonstrated multiple sequential lesions around 40 to 30% in the RCA territory.  . Left lower extremity venous duplex Left 06/27/11    Summary: No evidence of DVT involving the left lower extremity and right common femoral vein.   . Lower extremity arterial evaluation  06/27/11    SUMMARY: Right: ABI not ascertained due to false elevation in BP secondary to calcification (posterior tibial artery is non compressible). Left: ABI indicates moderate reduction in  arterial flow. Bilateral: Great toe PPG waveforms indicate adequate perfusion. Great toe pressures not obtained due to patient's movements secondary to pain.  . Duplex doppler  05/10/11    LE arterial dopplers demonstrate bilaterally reduced ABIs of 0.91 on right & 0.56 on left. She does report some decreased pain on the left, & there's moderate mixed-density plaque in the right SFA w/50 to 69% reduction. There's a 69% reduction in the left SFA & does appear to be occlusive disease of left posterior tibial artery. Right posterior dorsalis pedis artery demonstrates occlusive disease  . Insertion of dialysis catheter N/A 01/06/2013    Procedure: INSERTION OF DIALYSIS CATHETER;  Surgeon: Larina Earthly, MD;  Location: [facility] OR;  Service: Vascular;  Laterality: N/A;   Social History:  reports that she quit smoking about 27 years ago. She has quit using smokeless tobacco. She reports that she does not drink alcohol or use illicit drugs.  Allergies   Allergen Reactions  . Aspirin Nausea And Vomiting    325 mg (adult strength) [name]     Family History:  Family History  Problem Relation Age of Onset  . Cancer Sister     STOMACH  . Diabetes Sister   . Cancer Brother     BONE  . Diabetes Brother   . Anesthesia problems Neg Hx   . Hypotension Neg Hx   . Malignant hyperthermia Neg Hx   . Pseudochol deficiency Neg Hx   . Hyperlipidemia Daughter   . Hypertension Daughter   . Heart disease Daughter     before age 34  . Kidney disease Daughter   . Other Daughter     varicose veins  . Diabetes Daughter   . Heart disease Son     before age 32  . Hyperlipidemia Son   . Hypertension Son   . Heart attack Son   . Heart attack Daughter   . Heart disease Daughter     Before age 85     Prior to Admission medications   Medication Sig Start Date End Date Taking? Authorizing Provider  acetaminophen (TYLENOL) 325 MG tablet Take 650 mg by mouth every 6 (six) hours as needed for mild pain.   Yes Historical Provider, MD  allopurinol (ZYLOPRIM) 100 MG tablet Take 100 mg by mouth daily.    Yes Historical Provider, MD  amLODipine (NORVASC) 10 MG tablet Take 5 mg by mouth daily.    Yes Historical Provider, MD  aspirin EC 81 MG tablet Take 81 mg by mouth daily.   Yes Historical Provider, MD  calcitRIOL (ROCALTROL) 0.25 MCG capsule Take 0.25 mcg by mouth daily.  11/04/12  Yes Historical Provider, MD  calcium acetate (PHOSLO) 667 MG capsule Take 1 capsule (667 mg total) by mouth 3 (three) times daily with meals. 01/15/13  Yes Osvaldo Shipper, MD  carvedilol (COREG) 6.25 MG tablet Take 1 tablet (6.25 mg total) by mouth 2 (two) times daily with a meal. 07/22/13  Yes Chrystie Nose, MD  clopidogrel (PLAVIX) 75 MG tablet Take 75 mg by mouth daily.   Yes Historical Provider, MD  furosemide (LASIX) 80 MG tablet Take 1 tablet (80 mg total) by mouth 2 (two) times daily. 06/14/13  Yes Chrystie Nose, MD  gabapentin (NEURONTIN) 300 MG capsule Take 300 mg by mouth 3 (three) times daily.   Yes Historical Provider, MD  hydrALAZINE (APRESOLINE) 25 MG tablet Take 1 tablet (25  mg total) by mouth every 8 (eight) hours. 01/15/13  Yes Osvaldo Shipper, MD  HYDROcodone-acetaminophen (NORCO/VICODIN) 5-325 MG per tablet Take 1 tablet by mouth 2 (two) times daily as needed for moderate pain.  07/31/13  Yes Historical Provider, MD  insulin glargine (LANTUS) 100 UNIT/ML injection Inject 36 Units into the skin daily. 01/15/13  Yes Osvaldo Shipper, MD  isosorbide mononitrate (IMDUR) 60 MG 24 hr tablet Take 60 mg by mouth daily.    Yes Historical Provider, MD  levothyroxine (SYNTHROID, LEVOTHROID) 75 MCG tablet Take 75 mcg by mouth daily before breakfast.   Yes Historical Provider, MD  multivitamin (RENA-VIT) TABS tablet Take 1 tablet by mouth at bedtime. 01/15/13  Yes Osvaldo Shipper, MD  nitroGLYCERIN (NITROSTAT) 0.4 MG SL tablet Place 0.4 mg under the tongue every 5 (five) minutes as needed for chest pain.    Yes Historical Provider, MD  nortriptyline (PAMELOR) 50 MG capsule Take 50 mg by mouth daily.  05/30/13  Yes Historical Provider, MD  ONGLYZA 5 MG TABS tablet Take 5 mg by mouth daily.  11/22/12  Yes Historical Provider, MD  polyethylene glycol (MIRALAX / GLYCOLAX) packet Take 17 g by mouth daily as needed for mild constipation.   Yes Historical Provider, MD  pravastatin (PRAVACHOL) 40 MG tablet Take 40 mg by mouth daily.   Yes Historical Provider, MD  RENVELA 800 MG tablet Take 800 mg by mouth 3 (three) times daily with meals.  07/04/13  Yes Historical Provider, MD  travoprost, benzalkonium, (TRAVATAN) 0.004 % ophthalmic solution Place 1 drop into both eyes at bedtime.    Yes Historical Provider, MD   Physical Exam: Filed Vitals:   08/15/13 1630 08/15/13 1645 08/15/13 1700 08/15/13 1715  BP: 144/47 136/53 149/53 142/54  Pulse: 61 61 63 63  Temp:      TempSrc:      Resp: 17 17 18 25   SpO2: 96% 98% 96%  96%    Physical Exam  Constitutional: Appears well-developed and well-nourished. No distress.  HENT: Normocephalic. No tonsillar erythema or exudates Eyes: Conjunctivae and EOM are normal. PERRLA, no scleral icterus.  Neck: Normal ROM. Neck supple. (+) JVD  CVS: RRR, S1/S2 appreciated  Pulmonary: bibasilar crackles, no rhonchi, no stridor.  Abdominal: Soft. BS +,  no distension, tenderness, rebound or guarding.  Musculoskeletal: Left BKA, no tenderness to palpation ; (+) LE edema Lymphadenopathy: No lymphadenopathy noted, cervical, inguinal. Neuro: Alert. Normal reflexes. No focal neurologic deficits. Skin: Skin is warm and dry. No rash noted. Not diaphoretic. No erythema. No pallor.  Psychiatric: Normal mood and affect. Behavior, judgment, thought content normal.   Labs on Admission:  Basic Metabolic Panel:  Recent Labs Lab 08/15/13 1424  NA 136*  K 5.6*  CL 100  CO2 20  GLUCOSE 150*  BUN 79*  CREATININE 2.96*  CALCIUM 8.7   Liver Function Tests:  Recent Labs Lab 08/15/13 1424  AST 23  ALT 20  ALKPHOS 83  BILITOT 0.5  PROT 7.4  ALBUMIN 3.1*   No results found for this basename: LIPASE, AMYLASE,  in the last 168 hours No results found for this basename: AMMONIA,  in the last 168 hours CBC:  Recent Labs Lab 08/15/13 1424  WBC 10.5  NEUTROABS 8.1*  HGB 8.6*  HCT 27.5*  MCV 85.7  PLT 357   Cardiac Enzymes: No results found for this basename: CKTOTAL, CKMB, CKMBINDEX, TROPONINI,  in the last 168 hours BNP: No components found with this basename: POCBNP,  CBG: No  results found for this basename: GLUCAP,  in the last 168 hours  If 7PM-7AM, please contact night-coverage [email or web address] Password [facility] 08/15/2013, 5:42 PM

## 2013-08-16 ENCOUNTER — Encounter (HOSPITAL_COMMUNITY): Payer: Self-pay | Admitting: General Practice

## 2013-08-16 DIAGNOSIS — J9600 Acute respiratory failure, unspecified whether with hypoxia or hypercapnia: Secondary | ICD-10-CM

## 2013-08-16 DIAGNOSIS — I43 Cardiomyopathy in diseases classified elsewhere: Secondary | ICD-10-CM

## 2013-08-16 DIAGNOSIS — I739 Peripheral vascular disease, unspecified: Secondary | ICD-10-CM

## 2013-08-16 DIAGNOSIS — I059 Rheumatic mitral valve disease, unspecified: Secondary | ICD-10-CM

## 2013-08-16 DIAGNOSIS — I2510 Atherosclerotic heart disease of native coronary artery without angina pectoris: Secondary | ICD-10-CM

## 2013-08-16 DIAGNOSIS — I5031 Acute diastolic (congestive) heart failure: Secondary | ICD-10-CM

## 2013-08-16 DIAGNOSIS — E039 Hypothyroidism, unspecified: Secondary | ICD-10-CM

## 2013-08-16 LAB — GLUCOSE, CAPILLARY
GLUCOSE-CAPILLARY: 140 mg/dL — AB (ref 70–99)
Glucose-Capillary: 76 mg/dL (ref 70–99)
Glucose-Capillary: 126 mg/dL — ABNORMAL HIGH (ref 70–99)
Glucose-Capillary: 140 mg/dL — ABNORMAL HIGH (ref 70–99)

## 2013-08-16 LAB — BASIC METABOLIC PANEL
BUN: 81 mg/dL — ABNORMAL HIGH (ref 6–23)
CALCIUM: 8.8 mg/dL (ref 8.4–10.5)
CO2: 17 meq/L — AB (ref 19–32)
CREATININE: 3.1 mg/dL — AB (ref 0.50–1.10)
Chloride: 102 mEq/L (ref 96–112)
GFR calc Af Amer: 14 mL/min — ABNORMAL LOW (ref >90)
GFR calc non Af Amer: 12 mL/min — ABNORMAL LOW (ref >90)
Glucose, Bld: 109 mg/dL — ABNORMAL HIGH (ref 70–99)
Potassium: 5.1 mEq/L (ref 3.7–5.3)
Sodium: 138 mEq/L (ref 137–147)

## 2013-08-16 MED ORDER — CARVEDILOL 12.5 MG PO TABS
12.5000 mg | ORAL_TABLET | Freq: Two times a day (BID) | ORAL | Status: DC
  Administered 2013-08-16 – 2013-08-17 (×3): 12.5 mg via ORAL
  Filled 2013-08-16 (×6): qty 1

## 2013-08-16 MED ORDER — FUROSEMIDE 10 MG/ML IJ SOLN
40.0000 mg | Freq: Every day | INTRAMUSCULAR | Status: DC
  Administered 2013-08-16 – 2013-08-17 (×2): 40 mg via INTRAVENOUS
  Filled 2013-08-16: qty 4

## 2013-08-16 NOTE — Progress Notes (Signed)
UR completed Aeden Matranga K. Giann Obara, RN, BSN, MSHL, CCM  08/16/2013 2:24 PM

## 2013-08-16 NOTE — Progress Notes (Signed)
  Echocardiogram 2D Echocardiogram has been performed.  [name] 08/16/2013, 4:49 PM

## 2013-08-16 NOTE — Progress Notes (Signed)
MD paged about patient being in accelerated junctional rhythm. EKG obtained. BP 118/44 and HR 68. No verbal complaints and no signs or symptoms of distress or discomfort. Will continue to monitor patient for further changes in condition.

## 2013-08-16 NOTE — Progress Notes (Signed)
Made MD aware of morning Telemetry strip showed jucntional rhythm. New orders given. Will continue to monitor patient for further changes in condition.

## 2013-08-16 NOTE — Progress Notes (Signed)
Report given to receiving RN. Patient in bed eating with family at the bedside. No verbal complaints and no signs or symptoms of distress or discomfort.

## 2013-08-16 NOTE — Progress Notes (Signed)
[facility] PROGRESS NOTE  [name] PVX:[id number] DOB: 11-Sep-1923 DOA: 08/15/2013 PCP: Georgianne Fick, MD  Assessment/Plan: Acute respiratory failure with hypoxia  Secondary to acute diastolic CHF exacerbation  Daily weight and strict intake and output  Continue her home meds: coreg, imdur, hydralazine, Norvasc, aspirin and plavix  Wt of 57.8kg this AM - usual weight normally around 55kg Acute diastolic CHF (congestive heart failure)  Continue coreg  Lasix IV daily Cont with daily weight and strict intake and output  Replete electrolytes as need  Obtain 2 D ECHO  Thus far net neg 300cc CAD - moderate at cath July 2012 (medical Rx)  Continue aspirin and plavix Hypothyroidism  Continue levothyroxine Peripheral vascular disease  Status post left BKA  Continue aspirin and plavix Dyslipidemia  Continue statin therapy  Chronic renal insufficiency, stage IV (severe)- HD started 01/05/13  Please inform renal of pt admission  Continue calcitrol, phoslo and renvela Anemia of chronic disease  Secondary to CKD  Hemoglobin stable  No current indications for transfusion  Hyperkalemia  Given lasix 20 mg IV x 2 doses  follow up BMP  Code Status: Full Family Communication: Pt in room Disposition Plan: Pending   Consultants:    Procedures:    Antibiotics:   (indicate start date, and stop date if known)  HPI/Subjective: Pt reports feeling better. No acute events noted overnight  Objective: Filed Vitals:   08/15/13 1844 08/15/13 2106 08/16/13 0510 08/16/13 0539  BP: 148/62 161/51  131/80  Pulse: 66 73  66  Temp: 97.9 F (36.6 C) 98 F (36.7 C)  98.8 F (37.1 C)  TempSrc: Oral Oral  Oral  Resp: 20 18  19   Height: 4\' 11"  (1.499 m)     Weight: 57.8 kg (127 lb 6.8 oz)  57.788 kg (127 lb 6.4 oz)   SpO2: 93% 96%  100%    Intake/Output Summary (Last 24 hours) at 08/16/13 0851 Last data filed at 08/16/13 4827  Gross per 24 hour  Intake    240 ml   Output    550 ml  Net   -310 ml   Filed Weights   08/15/13 1844 08/16/13 0510  Weight: 57.8 kg (127 lb 6.8 oz) 57.788 kg (127 lb 6.4 oz)    Exam:   General:  Awake, in nad  Cardiovascular: regular, s1, s2  Respiratory: normal resp effort, no wheezing  Abdomen: soft, nondistended  Musculoskeletal: perfused, no clubbing   Data Reviewed: Basic Metabolic Panel:  Recent Labs Lab 08/15/13 1424 08/15/13 2015  NA 136* 137  K 5.6* 5.7*  CL 100 100  CO2 20 18*  GLUCOSE 150* 128*  BUN 79* 79*  CREATININE 2.96* 3.04*  CALCIUM 8.7 8.7  MG  --  2.6*   Liver Function Tests:  Recent Labs Lab 08/15/13 1424 08/15/13 2015  AST 23 29  ALT 20 20  ALKPHOS 83 86  BILITOT 0.5 0.6  PROT 7.4 7.1  ALBUMIN 3.1* 3.0*   No results found for this basename: LIPASE, AMYLASE,  in the last 168 hours No results found for this basename: AMMONIA,  in the last 168 hours CBC:  Recent Labs Lab 08/15/13 1424  WBC 10.5  NEUTROABS 8.1*  HGB 8.6*  HCT 27.5*  MCV 85.7  PLT 357   Cardiac Enzymes: No results found for this basename: CKTOTAL, CKMB, CKMBINDEX, TROPONINI,  in the last 168 hours BNP (last 3 results)  Recent Labs  06/10/13 1645 08/15/13 1424 08/15/13 2015  PROBNP 1297.00* 30760.0*  31926.0*   CBG:  Recent Labs Lab 08/15/13 2146 08/16/13 0656  GLUCAP 178* 76    No results found for this or any previous visit (from the past 240 hour(s)).   Studies: Dg Chest Port 1 View  08/15/2013   CLINICAL DATA:  Short of breath  EXAM: PORTABLE CHEST - 1 VIEW  COMPARISON:  Radiograph 06/17/2013  FINDINGS: Normal mediastinum and cardiac silhouette. Normal pulmonary vasculature. No effusion, infiltrate, pneumothorax. Mild left basilar atelectasis.  IMPRESSION: Mild left basilar atelectasis.  No pulmonary edema.   Electronically Signed   By: Genevive BiStewart  Edmunds M.D.   On: 08/15/2013 15:48    Scheduled Meds: . allopurinol  100 mg Oral Daily  . amLODipine  5 mg Oral Daily  .  aspirin EC  81 mg Oral Daily  . calcitRIOL  0.25 mcg Oral Daily  . calcium acetate  667 mg Oral TID WC  . carvedilol  6.25 mg Oral BID WC  . clopidogrel  75 mg Oral Q breakfast  . gabapentin  300 mg Oral TID  . hydrALAZINE  25 mg Oral 3 times per day  . insulin glargine  36 Units Subcutaneous QHS  . isosorbide mononitrate  60 mg Oral Daily  . latanoprost  1 drop Both Eyes QHS  . levothyroxine  75 mcg Oral QAC breakfast  . linagliptin  5 mg Oral Daily  . multivitamin  1 tablet Oral QHS  . nortriptyline  50 mg Oral QHS  . sevelamer carbonate  800 mg Oral TID WC  . simvastatin  20 mg Oral q1800  . 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 If 7PM-7AM, please contact night-coverage at [email or web address], password [email or web address] 08/16/2013, 8:51 AM  LOS: 1 day

## 2013-08-16 NOTE — Care Management Note (Addendum)
    Page 1 of 1   08/19/2013     11:06:44 AM CARE MANAGEMENT NOTE 08/19/2013  Patient:  [name], [name]   Account Number:  [id number]  Date Initiated:  08/16/2013  Documentation initiated by:  HUTCHINSON,CRYSTAL  Subjective/Objective Assessment:   CHF     Action/Plan:   IV diureses.   Anticipated DC Date:  08/19/2013   Anticipated DC Plan:  IP REHAB FACILITY  In-house referral  Clinical Social Worker      DC Planning Services  CM consult      Choice offered to / List presented to:             Status of service:  In process, will continue to follow Medicare Important Message given?  YES (If response is "NO", the following Medicare IM given date fields will be blank) Date Medicare IM given:  08/15/2013 Date Additional Medicare IM given:  08/19/2013  Discharge Disposition:    Per UR Regulation:  Reviewed for med. necessity/level of care/duration of stay  If discussed at Long Length of Stay Meetings, dates discussed:   08/20/2013    Comments:  08/19/13 1055 Ionia Schey Lucretia Roers, RN, BSN, [place] [phone] Spoke with Juliann Mule regarding appropriaetness for placementt of CIR.  Pt has been screened and awaiting official order from MD to proceed.  Crystal Hutchinson RN, BSN, MSHL, CCM  Nurse - Case Manager, (Unit [phone]  08/16/2013 Social:  From home Disposition Plan:  pending

## 2013-08-17 DIAGNOSIS — N184 Chronic kidney disease, stage 4 (severe): Secondary | ICD-10-CM

## 2013-08-17 DIAGNOSIS — I10 Essential (primary) hypertension: Secondary | ICD-10-CM

## 2013-08-17 DIAGNOSIS — J9600 Acute respiratory failure, unspecified whether with hypoxia or hypercapnia: Secondary | ICD-10-CM

## 2013-08-17 DIAGNOSIS — I5023 Acute on chronic systolic (congestive) heart failure: Secondary | ICD-10-CM

## 2013-08-17 LAB — BASIC METABOLIC PANEL
BUN: 89 mg/dL — AB (ref 6–23)
CHLORIDE: 102 meq/L (ref 96–112)
CO2: 22 mEq/L (ref 19–32)
CREATININE: 3.28 mg/dL — AB (ref 0.50–1.10)
Calcium: 9 mg/dL (ref 8.4–10.5)
GFR calc Af Amer: 13 mL/min — ABNORMAL LOW (ref >90)
GFR, EST NON AFRICAN AMERICAN: 12 mL/min — AB (ref >90)
Glucose, Bld: 100 mg/dL — ABNORMAL HIGH (ref 70–99)
Potassium: 4.5 mEq/L (ref 3.7–5.3)
Sodium: 139 mEq/L (ref 137–147)

## 2013-08-17 LAB — GLUCOSE, CAPILLARY
GLUCOSE-CAPILLARY: 90 mg/dL (ref 70–99)
GLUCOSE-CAPILLARY: 170 mg/dL — AB (ref 70–99)
GLUCOSE-CAPILLARY: 116 mg/dL — AB (ref 70–99)
Glucose-Capillary: 105 mg/dL — ABNORMAL HIGH (ref 70–99)

## 2013-08-17 MED ORDER — ONDANSETRON HCL 4 MG/2ML IJ SOLN: 4.0000 mg | Freq: Four times a day (QID) | INTRAMUSCULAR | Status: DC | PRN

## 2013-08-17 MED ORDER — FUROSEMIDE 10 MG/ML IJ SOLN
80.0000 mg | Freq: Every day | INTRAMUSCULAR | Status: DC
  Filled 2013-08-17: qty 8

## 2013-08-17 MED ORDER — INSULIN ASPART 100 UNIT/ML ~~LOC~~ SOLN: 0.0000 [IU] | Freq: Every day | SUBCUTANEOUS | Status: DC

## 2013-08-17 MED ORDER — INSULIN ASPART 100 UNIT/ML ~~LOC~~ SOLN
0.0000 [IU] | Freq: Three times a day (TID) | SUBCUTANEOUS | Status: DC
  Administered 2013-08-17: 3 [IU] via SUBCUTANEOUS
  Administered 2013-08-18: 2 [IU] via SUBCUTANEOUS
  Administered 2013-08-20 – 2013-08-22 (×2): 3 [IU] via SUBCUTANEOUS
  Administered 2013-08-22: 5 [IU] via SUBCUTANEOUS
  Administered 2013-08-23 (×2): 3 [IU] via SUBCUTANEOUS

## 2013-08-17 NOTE — Consult Note (Signed)
Reason for Consult: Acute on chronic diastolic heart failure Referring Physician: Dr. Wynn Maudlinevine  [name] is an 80 y.o. female.   HPI: The patient is a very pleasant 80 year old woman with a long-standing history of hypertension, diabetes, and chronic renal insufficiency. She has a history of medical and dietary noncompliance. The patient notes that she has been short of breath for approximately one week, with worsening symptoms present approximately 2 days prior to admission. She had increasing peripheral edema, mostly in the belly in the arms. 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 She admits to dietary indiscretion. She eats bacon. She has not had much in the way of peripheral edema but notes that her arms and abdominal area have been swollen. She is pending fistula insertion and subsequent dialysis. She has not had syncope, and is thought to have preserved left ventricular systolic function.  PMH: Past Medical History  Diagnosis Date  . Coronary artery disease   . TIA (transient ischemic attack)   . Hypertension   . Shingles   . Stroke   . Anemia   . CAD (coronary artery disease) 02/08/2011  . Cardiomyopathy, idiopathic 02/08/2011  . Chronic kidney disease   . Angina     Takes Isosorbide  . Unstable angina 02/08/2011  . Myocardial infarct     x 3 unsure of years  . Irregular heartbeat   . Ulcer   . GERD (gastroesophageal reflux disease)     takes Protonix and zantac  . Hx of transient ischemic attack (TIA)   . Memory loss   . CHF (congestive heart failure)     Takes Lasix  . Gout     takes allopurinol  . Peripheral vascular disease     left lower  leg  . Diabetes mellitus     type 2 NIDDM x 2 years; no meds  . Chronic kidney disease (CKD), stage IV (severe)   . Arthritis   . Shortness of breath   . Thyroid disease   . Hypothyroidism     (SEVERE) Takes Levothryroxine  . Hypothyroid 02/08/2011  . Nonischemic cardiomyopathy     EF now is 55%,  reduced due to myxedema, which is improved.  . Dyslipidemia   . Peripheral neuropathy   . H/O echocardiogram 09/06/11    Indication- nonIschemic Cardiomyopathy. EF = now greater than 55% with no regional wall motion abnormalities. Tthere is mild to moderate trisuspid regurgitayion and mild pulmonary hypertension with an RVSP of 35 mmHg as well as stage 1 diastolic dysfunction and mild to moderate LVH.  Marland Kitchen. Abnormal nuclear stress test 06/01/09    Demonstrated a new area of infarct scar, peri-infarct ischemia seen in the inferolateral territory. EF eas normal at 70% with mild hypocontractility at the apex, distal inferolateral wall.    PSHX: Past Surgical History  Procedure Laterality Date  . [facility]  . Eye surgery      Left eye surgery; cataract removal  . Left bka  07/05/2011  . Av fistula placement    . Amputation  07/05/2011    Procedure: AMPUTATION BELOW KNEE;  Surgeon: Chuck Hinthristopher S Dickson, MD;  [facility];  Service: Vascular;  Laterality: Left;  . Angioplasty  1988  . Cardiac catheterization  09/28/07    Demonstrated multiple sequential lesions around 40 to 30% in the RCA territory.  . Left lower extremity venous duplex Left 06/27/11    Summary: No evidence of  DVT involving the left lower extremity and right common femoral vein.   . Lower extremity arterial evaluation  06/27/11    SUMMARY: Right: ABI not ascertained due to false elevation in BP secondary to calcification (posterior tibial artery is non compressible). Left: ABI indicates moderate reduction in arterial flow. Bilateral: Great toe PPG waveforms indicate adequate perfusion. Great toe pressures not obtained due to patient's movements secondary to pain.  . Duplex doppler  05/10/11    LE arterial dopplers demonstrate bilaterally reduced ABIs of 0.91 on right & 0.56 on left. She does report some decreased pain on the left, & there's moderate mixed-density plaque in the right SFA w/50 to 69%  reduction. There's a 69% reduction in the left SFA & does appear to be occlusive disease of left posterior tibial artery. Right posterior dorsalis pedis artery demonstrates occlusive disease  . Insertion of dialysis catheter N/A 01/06/2013    Procedure: INSERTION OF DIALYSIS CATHETER;  Surgeon: Larina Earthly, MD;  Location: [facility] OR;  Service: Vascular;  Laterality: N/A;    FAMHX: Family History  Problem Relation Age of Onset  . Cancer Sister     STOMACH  . Diabetes Sister   . Cancer Brother     BONE  . Diabetes Brother   . Anesthesia problems Neg Hx   . Hypotension Neg Hx   . Malignant hyperthermia Neg Hx   . Pseudochol deficiency Neg Hx   . Hyperlipidemia Daughter   . Hypertension Daughter   . Heart disease Daughter     before age 66  . Kidney disease Daughter   . Other Daughter     varicose veins  . Diabetes Daughter   . Heart disease Son     before age 58  . Hyperlipidemia Son   . Hypertension Son   . Heart attack Son   . Heart attack Daughter   . Heart disease Daughter     Before age 51    Social History:  reports that she quit smoking about 27 years ago. She has quit using smokeless tobacco. She reports that she does not drink alcohol or use illicit drugs.  Allergies:  Allergies  Allergen Reactions  . Aspirin Nausea And Vomiting    325 mg (adult strength) [name]     Medications: Reviewed  No results found.  ROS  As stated in the HPI and negative for all other systems.  Physical Exam  Vitals:Blood pressure 71/25, pulse 57, temperature 97.7 F (36.5 C), temperature source Oral, resp. rate 20, height 4\' 11"  (1.499 m), weight 127 lb 10.3 oz (57.9 kg), SpO2 97.00%.  Chronically ill appearing NAD HEENT: Unremarkable Neck:  No JVD, no thyromegally Back:  No CVA tenderness Lungs:  Scattered rales in the bases bilaterally HEART:  Regular rate rhythm, no murmurs, no rubs, no clicks Abd:  soft,  positive bowel sounds, no organomegally, no rebound, no guarding Ext:  2 plus pulses, 2+ peripheral edema on the right, no cyanosis, no clubbing, left below the knee amputation Skin:  No rashes no nodules Neuro:  CN II through XII intact, motor grossly intact  Assessment/Plan: 1. acute on chronic diastolic heart failure 2. stage 4-5 renal failure pending fistula insertion 3. long-standing uncontrolled hypertension 4. long-standing diabetes 5. coronary artery disease Recommendation: She has a very difficult combination of problems. She has evidence of volume overload on physical examination, and I would recommend increasing her dose of diuretic. Ultimately, her  volume situation will be much easier to handle once she is on dialysis. The patient admits to dietary indiscretion, and we discussed the importance of a low-sodium diet. We also discussed the importance of taking medications correctly.  Doylene Canning TaylorMD 08/17/2013, 3:57 PM

## 2013-08-17 NOTE — Progress Notes (Signed)
[facility] PROGRESS NOTE  Shannon SirenRoberta G Barse ZOX:[id number] DOB: 08/26/1923 DOA: 08/15/2013 PCP: Georgianne FickAMACHANDRAN,AJITH, MD  Assessment/Plan: Acute respiratory failure with hypoxia  Secondary to acute diastolic CHF exacerbation  Daily weight and strict intake and output  Continue her home meds: coreg, imdur, hydralazine, Norvasc, aspirin and plavix  Wt of 57.9kg this AM - usual weight normally around 55kg Acute diastolic CHF (congestive heart failure)  Continue coreg  Lasix IV daily at 40mg  Cont with daily weight and strict intake and output  Replete electrolytes as need  2 D ECHO with EF 50-55% Thus far net pos 200cc since admit Consider assistance from Cardiology/Heart Failure service. Of note, pt has hx of difficult to manage heart failure, including exacerbation in 10/14 which resulted in complicated hospital course and required HD CAD - moderate at cath July 2012 (medical Rx)  Continue aspirin and plavix Hypothyroidism  Continue levothyroxine Peripheral vascular disease  Status post left BKA  Continue aspirin and plavix Dyslipidemia  Continue statin therapy  Chronic renal insufficiency, stage IV (severe)  Please inform renal of pt admission  Continue calcitrol, phoslo and renvela Per family, pt scheduled to decide whether to pursue HD at upcoming outpatient appt Anemia of chronic disease  Secondary to CKD  Hemoglobin stable  No current indications for transfusion  Hyperkalemia  Given lasix 20 mg IV x 2 doses  follow up BMP  Code Status: Full Family Communication: Pt in room Disposition Plan: Pending   Consultants:    Procedures:    Antibiotics:   (indicate start date, and stop date if known)  HPI/Subjective: No acute events. Pt reports continued sob  Objective: Filed Vitals:   08/16/13 2116 08/16/13 2323 08/17/13 0236 08/17/13 0650  BP: 128/46 110/45 119/58 137/47  Pulse: 67  56 53  Temp: 97.8 F (36.6 C)  97.1 F (36.2 C) 97.2 F (36.2 C)   TempSrc: Oral  Oral Oral  Resp: 20  16 18   Height:      Weight:    57.9 kg (127 lb 10.3 oz)  SpO2: 100%  95% 100%    Intake/Output Summary (Last 24 hours) at 08/17/13 0926 Last data filed at 08/17/13 0030  Gross per 24 hour  Intake    960 ml  Output    450 ml  Net    510 ml   Filed Weights   08/15/13 1844 08/16/13 0510 08/17/13 0650  Weight: 57.8 kg (127 lb 6.8 oz) 57.788 kg (127 lb 6.4 oz) 57.9 kg (127 lb 10.3 oz)    Exam:   General:  Awake, in nad  Cardiovascular: regular, s1, s2  Respiratory: normal resp effort, no wheezing, crackles R>L  Abdomen: soft, nondistended  Musculoskeletal: perfused, no clubbing   Data Reviewed: Basic Metabolic Panel:  Recent Labs Lab 08/15/13 1424 08/15/13 2015 08/16/13 1010 08/17/13 0400  NA 136* 137 138 139  K 5.6* 5.7* 5.1 4.5  CL 100 100 102 102  CO2 20 18* 17* 22  GLUCOSE 150* 128* 109* 100*  BUN 79* 79* 81* 89*  CREATININE 2.96* 3.04* 3.10* 3.28*  CALCIUM 8.7 8.7 8.8 9.0  MG  --  2.6*  --   --    Liver Function Tests:  Recent Labs Lab 08/15/13 1424 08/15/13 2015  AST 23 29  ALT 20 20  ALKPHOS 83 86  BILITOT 0.5 0.6  PROT 7.4 7.1  ALBUMIN 3.1* 3.0*   No results found for this basename: LIPASE, AMYLASE,  in the last 168 hours No results found  for this basename: AMMONIA,  in the last 168 hours CBC:  Recent Labs Lab 08/15/13 1424  WBC 10.5  NEUTROABS 8.1*  HGB 8.6*  HCT 27.5*  MCV 85.7  PLT 357   Cardiac Enzymes: No results found for this basename: CKTOTAL, CKMB, CKMBINDEX, TROPONINI,  in the last 168 hours BNP (last 3 results)  Recent Labs  06/10/13 1645 08/15/13 1424 08/15/13 2015  PROBNP 1297.00* 30760.0* 31926.0*   CBG:  Recent Labs Lab 08/16/13 0656 08/16/13 1137 08/16/13 1648 08/16/13 2114 08/17/13 0635  GLUCAP 76 126* 140* 140* 90    No results found for this or any previous visit (from the past 240 hour(s)).   Studies: Dg Chest Port 1 View  08/15/2013   CLINICAL DATA:   Short of breath  EXAM: PORTABLE CHEST - 1 VIEW  COMPARISON:  Radiograph 06/17/2013  FINDINGS: Normal mediastinum and cardiac silhouette. Normal pulmonary vasculature. No effusion, infiltrate, pneumothorax. Mild left basilar atelectasis.  IMPRESSION: Mild left basilar atelectasis.  No pulmonary edema.   Electronically Signed   By: Genevive Bi M.D.   On: 08/15/2013 15:48    Scheduled Meds: . allopurinol  100 mg Oral Daily  . amLODipine  5 mg Oral Daily  . aspirin EC  81 mg Oral Daily  . calcitRIOL  0.25 mcg Oral Daily  . calcium acetate  667 mg Oral TID WC  . carvedilol  12.5 mg Oral BID WC  . clopidogrel  75 mg Oral Q breakfast  . furosemide  40 mg Intravenous Daily  . gabapentin  300 mg Oral TID  . hydrALAZINE  25 mg Oral 3 times per day  . insulin glargine  36 Units Subcutaneous QHS  . isosorbide mononitrate  60 mg Oral Daily  . latanoprost  1 drop Both Eyes QHS  . levothyroxine  75 mcg Oral QAC breakfast  . linagliptin  5 mg Oral Daily  . multivitamin  1 tablet Oral QHS  . nortriptyline  50 mg Oral QHS  . sevelamer carbonate  800 mg Oral TID WC  . simvastatin  20 mg Oral q1800  . 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 If 7PM-7AM, please contact night-coverage at [email or web address], password [facility] 08/17/2013, 9:26 AM  LOS: 2 days

## 2013-08-17 NOTE — Progress Notes (Signed)
Patient alert and oriented x4 throughout shift, son at bedside.  One episode of yellow emesis with whole pills this evening, no PRNs for nausea available, MD notified of occurrence.  Patient unable to void during shift, bladder scan this evening showed .  MD text paged with information.  Orders for Foley catheter and Zofran to be placed by MD.  Will continue to monitor.

## 2013-08-18 DIAGNOSIS — I798 Other disorders of arteries, arterioles and capillaries in diseases classified elsewhere: Secondary | ICD-10-CM

## 2013-08-18 DIAGNOSIS — E1159 Type 2 diabetes mellitus with other circulatory complications: Secondary | ICD-10-CM

## 2013-08-18 LAB — URINE MICROSCOPIC-ADD ON

## 2013-08-18 LAB — URINALYSIS, ROUTINE W REFLEX MICROSCOPIC
BILIRUBIN URINE: NEGATIVE
Glucose, UA: NEGATIVE mg/dL
Hgb urine dipstick: NEGATIVE
Ketones, ur: NEGATIVE mg/dL
NITRITE: NEGATIVE
PH: 5 (ref 5.0–8.0)
Protein, ur: NEGATIVE mg/dL
SPECIFIC GRAVITY, URINE: 1.014 (ref 1.005–1.030)
UROBILINOGEN UA: 0.2 mg/dL (ref 0.0–1.0)

## 2013-08-18 LAB — GLUCOSE, CAPILLARY
GLUCOSE-CAPILLARY: 96 mg/dL (ref 70–99)
GLUCOSE-CAPILLARY: 123 mg/dL — AB (ref 70–99)
Glucose-Capillary: 125 mg/dL — ABNORMAL HIGH (ref 70–99)
Glucose-Capillary: 115 mg/dL — ABNORMAL HIGH (ref 70–99)

## 2013-08-18 LAB — BASIC METABOLIC PANEL
BUN: 98 mg/dL — ABNORMAL HIGH (ref 6–23)
CALCIUM: 8.7 mg/dL (ref 8.4–10.5)
CO2: 18 mEq/L — ABNORMAL LOW (ref 19–32)
CREATININE: 3.67 mg/dL — AB (ref 0.50–1.10)
Chloride: 97 mEq/L (ref 96–112)
GFR calc non Af Amer: 10 mL/min — ABNORMAL LOW (ref >90)
GFR, EST AFRICAN AMERICAN: 12 mL/min — AB (ref >90)
Glucose, Bld: 131 mg/dL — ABNORMAL HIGH (ref 70–99)
Potassium: 5.1 mEq/L (ref 3.7–5.3)
Sodium: 134 mEq/L — ABNORMAL LOW (ref 137–147)

## 2013-08-18 MED ORDER — FUROSEMIDE 10 MG/ML IJ SOLN
160.0000 mg | Freq: Four times a day (QID) | INTRAMUSCULAR | Status: DC
  Administered 2013-08-18 – 2013-08-22 (×15): 160 mg via INTRAVENOUS
  Filled 2013-08-18 (×18): qty 16

## 2013-08-18 MED ORDER — DARBEPOETIN ALFA-POLYSORBATE 150 MCG/0.3ML IJ SOLN
150.0000 ug | Freq: Once | INTRAMUSCULAR | Status: AC
  Administered 2013-08-18: 150 ug via SUBCUTANEOUS
  Filled 2013-08-18 (×2): qty 0.3

## 2013-08-18 MED ORDER — CARVEDILOL 3.125 MG PO TABS
3.1250 mg | ORAL_TABLET | Freq: Two times a day (BID) | ORAL | Status: DC
  Filled 2013-08-18 (×2): qty 1

## 2013-08-18 MED ORDER — CARVEDILOL 3.125 MG PO TABS
3.1250 mg | ORAL_TABLET | Freq: Two times a day (BID) | ORAL | Status: DC
  Administered 2013-08-19 – 2013-08-23 (×8): 3.125 mg via ORAL
  Filled 2013-08-18 (×13): qty 1

## 2013-08-18 MED ORDER — SODIUM BICARBONATE 650 MG PO TABS
1300.0000 mg | ORAL_TABLET | Freq: Two times a day (BID) | ORAL | Status: DC
  Administered 2013-08-18 – 2013-08-21 (×8): 1300 mg via ORAL
  Filled 2013-08-18 (×10): qty 2

## 2013-08-18 NOTE — Progress Notes (Signed)
[facility] PROGRESS NOTE  Marcy SirenRoberta G Roskos ZOX:[id number] DOB: 02/01/1924 DOA: 08/15/2013 PCP: Georgianne FickAMACHANDRAN,AJITH, MD  Assessment/Plan: Acute respiratory failure with hypoxia  Secondary to acute diastolic CHF exacerbation  Daily weight and strict intake and output  Continue her home meds: coreg, imdur, hydralazine, Norvasc, aspirin and plavix  Wt of 57.9kg->58.5kg overnight - usual weight normally around 55kg Acute diastolic CHF (congestive heart failure)  Continue coreg  Lasix IV daily at 80mg  Cont with daily weight and strict intake and output  Replete electrolytes as need  2 D ECHO with EF 50-55% Thus far net pos 200cc since admit Appreciate input from Cardiology. Very difficult and complicated case. As pt is currently being considered for outpt HD, would also consider involving Nephrology for recommendations CAD - moderate at cath July 2012 (medical Rx)  Continue aspirin and plavix Hypothyroidism  Continue levothyroxine Peripheral vascular disease  Status post left BKA  Continue aspirin and plavix Dyslipidemia  Continue statin therapy  Chronic renal insufficiency, stage IV (severe)  Continue calcitrol, phoslo and renvela Cr slowly rising Pt currently being considered for outpt HD. Therefore, per above, would also consider involving Nephrology as well Anemia of chronic disease  Secondary to CKD  Hemoglobin stable  No current indications for transfusion  Hyperkalemia   Improved Cont to follow lytes  Code Status: Full Family Communication: Pt in room Disposition Plan: Pending   Consultants:    Procedures:    Antibiotics:    HPI/Subjective: No acute events noted overnight. No complaints.  Objective: Filed Vitals:   08/17/13 1602 08/17/13 1834 08/17/13 2134 08/18/13 0539  BP: 133/45 126/53 126/42 118/49  Pulse: 57 61 59 54  Temp:   98 F (36.7 C) 98 F (36.7 C)  TempSrc:    Oral  Resp:  18 19 18   Height:      Weight:    58.5 kg (128 lb 15.5  oz)  SpO2: 94% 92% 97% 97%    Intake/Output Summary (Last 24 hours) at 08/18/13 0807 Last data filed at 08/17/13 1804  Gross per 24 hour  Intake    123 ml  Output      0 ml  Net    123 ml   Filed Weights   08/16/13 0510 08/17/13 0650 08/18/13 0539  Weight: 57.788 kg (127 lb 6.4 oz) 57.9 kg (127 lb 10.3 oz) 58.5 kg (128 lb 15.5 oz)    Exam:   General:  Awake, in nad  Cardiovascular: regular, s1, s2  Respiratory: normal resp effort, no wheezing, crackles R>L  Abdomen: soft, nondistended  Musculoskeletal: perfused, no clubbing   Data Reviewed: Basic Metabolic Panel:  Recent Labs Lab 08/15/13 1424 08/15/13 2015 08/16/13 1010 08/17/13 0400 08/18/13 0440  NA 136* 137 138 139 134*  K 5.6* 5.7* 5.1 4.5 5.1  CL 100 100 102 102 97  CO2 20 18* 17* 22 18*  GLUCOSE 150* 128* 109* 100* 131*  BUN 79* 79* 81* 89* 98*  CREATININE 2.96* 3.04* 3.10* 3.28* 3.67*  CALCIUM 8.7 8.7 8.8 9.0 8.7  MG  --  2.6*  --   --   --    Liver Function Tests:  Recent Labs Lab 08/15/13 1424 08/15/13 2015  AST 23 29  ALT 20 20  ALKPHOS 83 86  BILITOT 0.5 0.6  PROT 7.4 7.1  ALBUMIN 3.1* 3.0*   No results found for this basename: LIPASE, AMYLASE,  in the last 168 hours No results found for this basename: AMMONIA,  in the last 168  hours CBC:  Recent Labs Lab 08/15/13 1424  WBC 10.5  NEUTROABS 8.1*  HGB 8.6*  HCT 27.5*  MCV 85.7  PLT 357   Cardiac Enzymes: No results found for this basename: CKTOTAL, CKMB, CKMBINDEX, TROPONINI,  in the last 168 hours BNP (last 3 results)  Recent Labs  06/10/13 1645 08/15/13 1424 08/15/13 2015  PROBNP 1297.00* 30760.0* 31926.0*   CBG:  Recent Labs Lab 08/17/13 0635 08/17/13 1133 08/17/13 1631 08/17/13 2150 08/18/13 0545  GLUCAP 90 105* 170* 116* 125*    No results found for this or any previous visit (from the past 240 hour(s)).   Studies: No results found.  Scheduled Meds: . allopurinol  100 mg Oral Daily  . amLODipine   5 mg Oral Daily  . aspirin EC  81 mg Oral Daily  . calcitRIOL  0.25 mcg Oral Daily  . calcium acetate  667 mg Oral TID WC  . carvedilol  12.5 mg Oral BID WC  . clopidogrel  75 mg Oral Q breakfast  . furosemide  80 mg Intravenous Daily  . gabapentin  300 mg Oral TID  . hydrALAZINE  25 mg Oral 3 times per day  . insulin aspart  0-15 Units Subcutaneous TID WC  . insulin aspart  0-5 Units Subcutaneous QHS  . insulin glargine  36 Units Subcutaneous QHS  . isosorbide mononitrate  60 mg Oral Daily  . latanoprost  1 drop Both Eyes QHS  . levothyroxine  75 mcg Oral QAC breakfast  . linagliptin  5 mg Oral Daily  . multivitamin  1 tablet Oral QHS  . nortriptyline  50 mg Oral QHS  . sevelamer carbonate  800 mg Oral TID WC  . simvastatin  20 mg Oral q1800  . 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 If 7PM-7AM, please contact night-coverage at [email or web address], password [facility] 08/18/2013, 8:07 AM  LOS: 3 days

## 2013-08-18 NOTE — Consult Note (Signed)
Reason for Consult:CKD4, fluid overload Referring Physician: Dr. Maretta Bees is an 80 y.o. female.  HPI: 80 yr old female with hx CKD 4 , last Cr 3.05 first week of Apr.  Hx of repeated bouts of CHF, had transient HD 10/14.  Has failed AVF LUA, and was to have access earlier this yr but has repeatedly put it off.  Here now with about a week of progressive, edema, SOB, and PND.  Has to set up to breathe at night. Nocturia x 3 .  No dysuria, hematuria . Coughing at night esp lying down.     Has PVD with AKA on L. Marland Kitchen Hx DM, CVA with mild L sided weakness, Hx CAD s/p Mi, Gout, ^ lipids. DJD, hypothyroid,memory issues.  Renal dz thought mostly HTN.  Constitutional: as above, good appetite Eyes: negative Ears, nose, mouth, throat, and face: negative Respiratory: as above Cardiovascular: as above Gastrointestinal: negative Genitourinary:as above Integument/breast: negative Hematologic/lymphatic: anemia Musculoskeletal:hx back surgery, some LBP Endocrine: DM, hypothyroid Allergic/Immunologic: ASA  . Primary Nephrologist Dr. Jimmy Footman. .   Past Medical History  Diagnosis Date  . Coronary artery disease   . TIA (transient ischemic attack)   . Hypertension   . Shingles   . Stroke   . Anemia   . CAD (coronary artery disease) 02/08/2011  . Cardiomyopathy, idiopathic 02/08/2011  . Chronic kidney disease   . Angina     Takes Isosorbide  . Unstable angina 02/08/2011  . Myocardial infarct     x 3 unsure of years  . Irregular heartbeat   . Ulcer   . GERD (gastroesophageal reflux disease)     takes Protonix and zantac  . Hx of transient ischemic attack (TIA)   . Memory loss   . CHF (congestive heart failure)     Takes Lasix  . Gout     takes allopurinol  . Peripheral vascular disease     left lower  leg  . Diabetes mellitus     type 2 NIDDM x 2 years; no meds  . Chronic kidney disease (CKD), stage IV (severe)   . Arthritis   . Shortness of breath   . Thyroid disease    . Hypothyroidism     (SEVERE) Takes Levothryroxine  . Hypothyroid 02/08/2011  . Nonischemic cardiomyopathy     EF now is 55%, reduced due to myxedema, which is improved.  . Dyslipidemia   . Peripheral neuropathy   . H/O echocardiogram 09/06/11    Indication- nonIschemic Cardiomyopathy. EF = now greater than 55% with no regional wall motion abnormalities. Tthere is mild to moderate trisuspid regurgitayion and mild pulmonary hypertension with an RVSP of 35 mmHg as well as stage 1 diastolic dysfunction and mild to moderate LVH.  Marland Kitchen Abnormal nuclear stress test 06/01/09    Demonstrated a new area of infarct scar, peri-infarct ischemia seen in the inferolateral territory. EF eas normal at 70% with mild hypocontractility at the apex, distal inferolateral wall.    Past Surgical History  Procedure Laterality Date  . Back surgery      [place] surgery      Left eye surgery; cataract removal  . Left bka  07/05/2011  . Av fistula placement    . Amputation  07/05/2011    Procedure: AMPUTATION BELOW KNEE;  Surgeon: Angelia Mould, MD;  Location: [place];  Service: Vascular;  Laterality: Left;  . Angioplasty  1988  . Cardiac catheterization  09/28/07  Demonstrated multiple sequential lesions around 40 to 30% in the RCA territory.  . Left lower extremity venous duplex Left 06/27/11    Summary: No evidence of DVT involving the left lower extremity and right common femoral vein.   . Lower extremity arterial evaluation  06/27/11    SUMMARY: Right: ABI not ascertained due to false elevation in BP secondary to calcification (posterior tibial artery is non compressible). Left: ABI indicates moderate reduction in arterial flow. Bilateral: Great toe PPG waveforms indicate adequate perfusion. Great toe pressures not obtained due to patient's movements secondary to pain.  . Duplex doppler  05/10/11    LE arterial dopplers demonstrate bilaterally reduced ABIs of 0.91 on right & 0.56 on left.  She does report some decreased pain on the left, & there's moderate mixed-density plaque in the right SFA w/50 to 69% reduction. There's a 69% reduction in the left SFA & does appear to be occlusive disease of left posterior tibial artery. Right posterior dorsalis pedis artery demonstrates occlusive disease  . Insertion of dialysis catheter N/A 01/06/2013    Procedure: INSERTION OF DIALYSIS CATHETER;  Surgeon: Rosetta Posner, MD;  Location: [facility] OR;  Service: Vascular;  Laterality: N/A;    Family History  Problem Relation Age of Onset  . Cancer Sister     STOMACH  . Diabetes Sister   . Cancer Brother     BONE  . Diabetes Brother   . Anesthesia problems Neg Hx   . Hypotension Neg Hx   . Malignant hyperthermia Neg Hx   . Pseudochol deficiency Neg Hx   . Hyperlipidemia Daughter   . Hypertension Daughter   . Heart disease Daughter     before age 41  . Kidney disease Daughter   . Other Daughter     varicose veins  . Diabetes Daughter   . Heart disease Son     before age 68  . Hyperlipidemia Son   . Hypertension Son   . Heart attack Son   . Heart attack Daughter   . Heart disease Daughter     Before age 40    Social History:  reports that she quit smoking about 27 years ago. She has quit using smokeless tobacco. She reports that she does not drink alcohol or use illicit drugs.  Allergies:  Allergies  Allergen Reactions  . Aspirin Nausea And Vomiting    325 mg (adult strength) [name]     Medications:  I have reviewed the patient's current medications. Prior to Admission:  Prescriptions prior to admission  Medication Sig Dispense Refill  . acetaminophen (TYLENOL) 325 MG tablet Take 650 mg by mouth every 6 (six) hours as needed for mild pain.      Marland Kitchen allopurinol (ZYLOPRIM) 100 MG tablet Take 100 mg by mouth daily.       Marland Kitchen amLODipine (NORVASC) 10 MG tablet Take 5 mg by mouth daily.       Marland Kitchen aspirin EC 81 MG tablet Take  81 mg by mouth daily.      . calcitRIOL (ROCALTROL) 0.25 MCG capsule Take 0.25 mcg by mouth daily.       . calcium acetate (PHOSLO) 667 MG capsule Take 1 capsule (667 mg total) by mouth 3 (three) times daily with meals.      . carvedilol (COREG) 6.25 MG tablet Take 1 tablet (6.25 mg total) by mouth 2 (two) times daily with a meal.  60 tablet  1  . clopidogrel (PLAVIX) 75 MG tablet Take 75 mg by mouth daily.      . furosemide (LASIX) 80 MG tablet Take 1 tablet (80 mg total) by mouth 2 (two) times daily.  60 tablet  6  . gabapentin (NEURONTIN) 300 MG capsule Take 300 mg by mouth 3 (three) times daily.      . hydrALAZINE (APRESOLINE) 25 MG tablet Take 1 tablet (25 mg total) by mouth every 8 (eight) hours.      Marland Kitchen HYDROcodone-acetaminophen (NORCO/VICODIN) 5-325 MG per tablet Take 1 tablet by mouth 2 (two) times daily as needed for moderate pain.       Marland Kitchen insulin glargine (LANTUS) 100 UNIT/ML injection Inject 36 Units into the skin daily.      . isosorbide mononitrate (IMDUR) 60 MG 24 hr tablet Take 60 mg by mouth daily.       Marland Kitchen levothyroxine (SYNTHROID, LEVOTHROID) 75 MCG tablet Take 75 mcg by mouth daily before breakfast.      . multivitamin (RENA-VIT) TABS tablet Take 1 tablet by mouth at bedtime.    0  . nitroGLYCERIN (NITROSTAT) 0.4 MG SL tablet Place 0.4 mg under the tongue every 5 (five) minutes as needed for chest pain.       . nortriptyline (PAMELOR) 50 MG capsule Take 50 mg by mouth daily.       . ONGLYZA 5 MG TABS tablet Take 5 mg by mouth daily.       . polyethylene glycol (MIRALAX / GLYCOLAX) packet Take 17 g by mouth daily as needed for mild constipation.      . pravastatin (PRAVACHOL) 40 MG tablet Take 40 mg by mouth daily.      Marland Kitchen RENVELA 800 MG tablet Take 800 mg by mouth 3 (three) times daily with meals.       . travoprost, benzalkonium, (TRAVATAN) 0.004 % ophthalmic solution Place 1 drop into both eyes at bedtime.         Results for orders placed during the hospital encounter of  08/15/13 (from the past 48 hour(s))  GLUCOSE, CAPILLARY     Status: Abnormal   Collection Time    08/16/13  4:48 PM      Result Value Ref Range   Glucose-Capillary 140 (*) 70 - 99 mg/dL   Comment 1 Notify RN    GLUCOSE, CAPILLARY     Status: Abnormal   Collection Time    08/16/13  9:14 PM      Result Value Ref Range   Glucose-Capillary 140 (*) 70 - 99 mg/dL   Comment 1 Notify RN    BASIC METABOLIC PANEL     Status: Abnormal   Collection Time    08/17/13  4:00 AM      Result Value Ref Range   Sodium 139  137 - 147 mEq/L   Potassium 4.5  3.7 - 5.3 mEq/L   Chloride 102  96 - 112 mEq/L   CO2 22  19 - 32 mEq/L   Glucose, Bld 100 (*) 70 - 99 mg/dL   BUN 89 (*) 6 - 23 mg/dL   Creatinine, Ser 3.28 (*) 0.50 - 1.10 mg/dL   Calcium 9.0  8.4 - 10.5 mg/dL   GFR calc non Af Amer 12 (*) >90 mL/min   GFR calc Af Amer 13 (*) >90 mL/min   Comment: (NOTE)     The eGFR has been calculated using the CKD EPI equation.     This calculation has not been  validated in all clinical situations.     eGFR's persistently <90 mL/min signify possible Chronic Kidney     Disease.  GLUCOSE, CAPILLARY     Status: None   Collection Time    08/17/13  6:35 AM      Result Value Ref Range   Glucose-Capillary 90  70 - 99 mg/dL   Comment 1 Notify RN    GLUCOSE, CAPILLARY     Status: Abnormal   Collection Time    08/17/13 11:33 AM      Result Value Ref Range   Glucose-Capillary 105 (*) 70 - 99 mg/dL   Comment 1 Notify RN    GLUCOSE, CAPILLARY     Status: Abnormal   Collection Time    08/17/13  4:31 PM      Result Value Ref Range   Glucose-Capillary 170 (*) 70 - 99 mg/dL  GLUCOSE, CAPILLARY     Status: Abnormal   Collection Time    08/17/13  9:50 PM      Result Value Ref Range   Glucose-Capillary 116 (*) 70 - 99 mg/dL   Comment 1 Notify RN    BASIC METABOLIC PANEL     Status: Abnormal   Collection Time    08/18/13  4:40 AM      Result Value Ref Range   Sodium 134 (*) 137 - 147 mEq/L   Potassium 5.1   3.7 - 5.3 mEq/L   Chloride 97  96 - 112 mEq/L   CO2 18 (*) 19 - 32 mEq/L   Glucose, Bld 131 (*) 70 - 99 mg/dL   BUN 98 (*) 6 - 23 mg/dL   Creatinine, Ser 3.67 (*) 0.50 - 1.10 mg/dL   Calcium 8.7  8.4 - 10.5 mg/dL   GFR calc non Af Amer 10 (*) >90 mL/min   GFR calc Af Amer 12 (*) >90 mL/min   Comment: (NOTE)     The eGFR has been calculated using the CKD EPI equation.     This calculation has not been validated in all clinical situations.     eGFR's persistently <90 mL/min signify possible Chronic Kidney     Disease.  GLUCOSE, CAPILLARY     Status: Abnormal   Collection Time    08/18/13  5:45 AM      Result Value Ref Range   Glucose-Capillary 125 (*) 70 - 99 mg/dL  GLUCOSE, CAPILLARY     Status: None   Collection Time    08/18/13 12:01 PM      Result Value Ref Range   Glucose-Capillary 96  70 - 99 mg/dL    No results found.  ROS Blood pressure 128/54, pulse 55, temperature 98 F (36.7 C), temperature source Oral, resp. rate 18, height 4' 11"  (1.499 m), weight 58.5 kg (128 lb 15.5 oz), SpO2 95.00%. Physical Exam Physical Examination: General appearance - obese, on O2, NAD Mental status - alert, oriented to person, place, and time, slow mentation Eyes - pupils equal and reactive, extraocular eye movements intact, funduscopic exam normal, discs flat and sharp Mouth - edentulous and upper plate Neck - adenopathy noted PCL Lymphatics - posterior cervical nodes Chest - rales noted in bases, decreased air entry noted bilat Heart - S1 and S2 normal, systolic murmur YD7/4 at apex Abdomen - obese,pos bx,liver down 6 cm Extremities - L AKA, 1-2+ edema Skin - normal coloration and turgor, no rashes, no suspicious skin lesions noted  Assessment/Plan: 1 CHF vol xs, will ^ lasix  ???  Out put. Has not been kept 2 CKD 4 with mild worsening.  Will follow Cr. Acidemic will suppl. Need to check UA 3 Hypertension: low need to lower meds to maximize renal perfusion, may be reason not opt  diuresis 4. Anemia of ESRD: need to tx 5. Metabolic Bone Disease: will check PTH 6 PVD 7 DM 8 CAD 9 Hypothyroid 10 ^ lipids P Lasix q 6, epo, check Fe, check PTH, stop and lower BP meds.   [name] 08/18/2013, 1:02 PM

## 2013-08-18 NOTE — Evaluation (Signed)
Physical Therapy Evaluation Patient Details Name: Shannon SirenRoberta G Rock MRN: [id number] DOB: 07/21/1923 Today's Date: 08/18/2013   History of Present Illness  80 year old female with past medical history significant for diastolic CHF, last 2 D ECHO in 2014 showed EF 65%, left BKA, hypothyroidism, CKD stage IV, dyslipidemia who [facility] with worsening shortness of breath started about few days prior to this admission and getting worse in past 24 hours associated with nonproductive cough. Pt reported feeling short of breath with rest and with exertion. In ED, BP was 127/49, HR 61-70, T max 98.8 F and oxygen saturation 87% on room air. This has improved to 92% with 2 L [place] oxygen support. CXR showed left basilar atelectasis but pulmonary edema. She was given 20 mg IV lasix in ED. Her BNP was in 30,000 range and baseline is around [id number] range.   Clinical Impression  Pt presents with significant decline in function from baseline, noting increased proximal muscle weakness, RUE edema limited ADLs (she is Right handed), poor trunk stability and increased need for physical assist for bed mobility and bed>chair transfers.  Pt has family support and the potential to return to prior level, therefore recommend CIR screen to determine if pt would be a candidate for admission.  If she is not, she will need some form of postacute rehab prior to d/c home.  Will initiate PT in acute setting and update recommendations as medical status evolves.  Thank you.    Follow Up Recommendations CIR (place CIR c/s, consider backup plan if pt not candidate)    Equipment Recommendations  None recommended by PT    Recommendations for Other Services Rehab consult     Precautions / Restrictions Precautions Precautions: Fall Precaution Comments: weak with poor trunk stability Restrictions Other Position/Activity Restrictions: RUE with significant edema, prop on pillows      Mobility  Bed Mobility Overal bed  mobility: Needs Assistance Bed Mobility: Supine to Sit     Supine to sit: Mod assist;HOB elevated     General bed mobility comments: pt unable to effectively assist self with UE push or pull, needs assist to raise trunk and shift hips to EOB  Transfers Overall transfer level: Needs assistance Equipment used: 1 person hand held assist Transfers: Lateral/Scoot Transfers          Lateral/Scoot Transfers: Max assist;With slide board (*recommend use slide board next visit) General transfer comment: performed seated lateral scoot, pt unable to unweight hips and shift without assist, limited further by weakened trunk and poor sitting stability plus rapid fatigue (see sitting balance comments).  placed drop arm recliner to pt's right side and used bed pad to slide into chair.  Educated Charity fundraiserN and CNA  on safe transfer for back to bed.    Ambulation/Gait             General Gait Details: pt non-ambulatory, reports able to propel self in WC at baseline, even up/down ramp.  Would assess WC locomotion next visit as able to determine safety/ability for home d/c  Administratortairs            Wheelchair Mobility Wheelchair Mobility Wheelchair mobility:  (assess next visit if able)  Modified Rankin (Stroke Patients Only)       Balance Overall balance assessment: Needs assistance Sitting-balance support: Bilateral upper extremity supported;Feet unsupported Sitting balance-Leahy Scale: Poor Sitting balance - Comments: poor sitting stability with limited ability to maintain neutral, requires 2 UE support and fagitues, leans to left  and posterior Postural control: Left lateral lean;Other (comment) (generally shakey)     Standing balance comment: pt does not stand                             Pertinent Vitals/Pain Nurse reports that bp of 71/25 was entered in error and pt has had no problems with bp.  Pre-eval 128/54 and pt denies s/s orthostasis with progressive positional changes.      Home Living Family/patient expects to be discharged to:: Private residence Living Arrangements: Children;Other relatives (sister in law) Available Help at Discharge: Family;Available PRN/intermittently Type of Home: House Home Access: Ramped entrance     Home Layout: One level Home Equipment: Bedside commode;Shower seat;Walker - 2 wheels;Wheelchair - power      Prior Function Level of Independence: Needs assistance   Gait / Transfers Assistance Needed: pt states she's needed assist for bed>WC transfers  ADL's / Homemaking Assistance Needed: family does all meals and housework, pt sponge bathes herself and dresses herself  Comments: sister in law helps with home/meals, daughter drives her shopping/appts     Hand Dominance   Dominant Hand: Right    Extremity/Trunk Assessment   Upper Extremity Assessment: Generalized weakness;RUE deficits/detail;Defer to OT evaluation (proximal weakness c/w hypothyroid, worse than baseline) RUE Deficits / Details: edema in hand/forearm.  Per nurse, MD says 'thats just where her fluid goes" and reports pt does not use arm.  Pt reports hx rotator cuff tear in past.  Pt educated on dependent edema, AROM and postioning and prop/rest arm on pillows.  RN and CNA also educated (asked nursing to assist with feeding, pt can't use dom hand) RUE:  (limited shoulder flex/abd with scapular substitution)       Lower Extremity Assessment: Generalized weakness;LLE deficits/detail   LLE Deficits / Details: previous BKA, unable to be fitted for prosthesis "it was too late" per patient.  Note limited knee extension     Communication   Communication: Other (comment) (weak vocalization)  Cognition Arousal/Alertness: Awake/alert Behavior During Therapy: Flat affect Overall Cognitive Status: Difficult to assess (appears intact)                      General Comments General comments (skin integrity, edema, etc.): significant edema right hand/forearm,  provided manual therapy to assist with venous return and instructed in positioning, AROM exercise for improved control.  Will need ongoing assist and reinforcement for max effectiveness.  Pt reluctant to use RUE for feeding or self-support    Exercises Hand Exercises Wrist Flexion: AAROM;Right;10 reps;Seated Wrist Extension: AAROM;Right;10 reps;Seated Digit Composite Flexion: AROM;Right;10 reps;Seated Composite Extension: AROM;Right;10 reps;Seated      Assessment/Plan    PT Assessment Patient needs continued PT services  PT Diagnosis Generalized weakness   PT Problem List Cardiopulmonary status limiting activity;Decreased knowledge of precautions;Decreased mobility;Decreased balance;Decreased activity tolerance;Decreased range of motion;Decreased strength  PT Treatment Interventions Manual techniques;Wheelchair mobility training;Patient/family education;Balance training;Therapeutic exercise;Therapeutic activities;Functional mobility training   PT Goals (Current goals can be found in the Care Plan section) Acute Rehab PT Goals Patient Stated Goal: home, able to get to/from W PT Goal Formulation: With patient Potential to Achieve Goals: Good    Frequency Min 3X/week   Barriers to discharge Decreased caregiver support (unclear volume and intensity of family support) can family provide increase level of assist?     Co-evaluation               End  of Session Equipment Utilized During Treatment: Oxygen (2-3 L per [place]) Activity Tolerance: Patient limited by fatigue Patient left: in chair;with nursing/sitter in room;with call bell/phone within reach Nurse Communication: Mobility status;Precautions         Time: 7829-5621 PT Time Calculation (min): 51 min   Charges:   PT Evaluation $Initial PT Evaluation Tier I: 1 Procedure PT Treatments $Therapeutic Exercise: 8-22 mins $Therapeutic Activity: 8-22 mins $Massage: 8-22 mins   PT G Codes:          Dennis Bast 08/18/2013, 10:47 AM

## 2013-08-18 NOTE — Progress Notes (Signed)
Patient alert and oriented x4 throughout shift.  Vital signs stable.  IV placed by IV team after 2 unsuccessful attempts by RNs.  Lasix 160mg  hung this evening as ordered.  Urine sample sent to lab for UA.  Patient up to chair with PT this AM.  Patient and family deny any questions or concerns at this time.  Will continue to monitor.

## 2013-08-19 ENCOUNTER — Inpatient Hospital Stay (HOSPITAL_COMMUNITY): Payer: Medicare Other

## 2013-08-19 DIAGNOSIS — R799 Abnormal finding of blood chemistry, unspecified: Secondary | ICD-10-CM

## 2013-08-19 DIAGNOSIS — I43 Cardiomyopathy in diseases classified elsewhere: Secondary | ICD-10-CM

## 2013-08-19 DIAGNOSIS — E039 Hypothyroidism, unspecified: Secondary | ICD-10-CM

## 2013-08-19 LAB — CBC
HCT: 25.9 % — ABNORMAL LOW (ref 36.0–46.0)
Hemoglobin: 8.4 g/dL — ABNORMAL LOW (ref 12.0–15.0)
MCH: 26.9 pg (ref 26.0–34.0)
MCHC: 32.4 g/dL (ref 30.0–36.0)
MCV: 83 fL (ref 78.0–100.0)
Platelets: ADEQUATE K/uL (ref 150–400)
RBC: 3.12 MIL/uL — ABNORMAL LOW (ref 3.87–5.11)
RDW: 18.6 % — ABNORMAL HIGH (ref 11.5–15.5)
WBC: 6.8 K/uL (ref 4.0–10.5)

## 2013-08-19 LAB — COMPREHENSIVE METABOLIC PANEL WITH GFR
ALT: 14 U/L (ref 0–35)
AST: 19 U/L (ref 0–37)
Albumin: 2.5 g/dL — ABNORMAL LOW (ref 3.5–5.2)
Alkaline Phosphatase: 76 U/L (ref 39–117)
BUN: 96 mg/dL — ABNORMAL HIGH (ref 6–23)
CO2: 19 meq/L (ref 19–32)
Calcium: 9 mg/dL (ref 8.4–10.5)
Chloride: 96 meq/L (ref 96–112)
Creatinine, Ser: 3.45 mg/dL — ABNORMAL HIGH (ref 0.50–1.10)
GFR calc Af Amer: 13 mL/min — ABNORMAL LOW
GFR calc non Af Amer: 11 mL/min — ABNORMAL LOW
Glucose, Bld: 119 mg/dL — ABNORMAL HIGH (ref 70–99)
Potassium: 5.1 meq/L (ref 3.7–5.3)
Sodium: 133 meq/L — ABNORMAL LOW (ref 137–147)
Total Bilirubin: 0.3 mg/dL (ref 0.3–1.2)
Total Protein: 6.3 g/dL (ref 6.0–8.3)

## 2013-08-19 LAB — IRON AND TIBC
IRON: 18 ug/dL — AB (ref 42–135)
SATURATION RATIOS: 8 % — AB (ref 20–55)
TIBC: 222 ug/dL — AB (ref 250–470)
UIBC: 204 ug/dL (ref 125–400)

## 2013-08-19 LAB — PHOSPHORUS: Phosphorus: 5.1 mg/dL — ABNORMAL HIGH (ref 2.3–4.6)

## 2013-08-19 LAB — GLUCOSE, CAPILLARY
GLUCOSE-CAPILLARY: 103 mg/dL — AB (ref 70–99)
GLUCOSE-CAPILLARY: 88 mg/dL (ref 70–99)
Glucose-Capillary: 108 mg/dL — ABNORMAL HIGH (ref 70–99)
Glucose-Capillary: 92 mg/dL (ref 70–99)

## 2013-08-19 LAB — PARATHYROID HORMONE, INTACT (NO CA): PTH: 98.9 pg/mL — ABNORMAL HIGH (ref 14.0–72.0)

## 2013-08-19 MED ORDER — SODIUM CHLORIDE 0.9 % IV SOLN
510.0000 mg | Freq: Once | INTRAVENOUS | Status: AC
  Administered 2013-08-19: 510 mg via INTRAVENOUS
  Filled 2013-08-19 (×2): qty 17

## 2013-08-19 MED ORDER — IPRATROPIUM-ALBUTEROL 0.5-2.5 (3) MG/3ML IN SOLN
3.0000 mL | Freq: Three times a day (TID) | RESPIRATORY_TRACT | Status: DC
  Administered 2013-08-20 – 2013-08-23 (×11): 3 mL via RESPIRATORY_TRACT
  Filled 2013-08-19 (×9): qty 3

## 2013-08-19 MED ORDER — IPRATROPIUM-ALBUTEROL 0.5-2.5 (3) MG/3ML IN SOLN: 3.0000 mL | RESPIRATORY_TRACT | Status: DC | PRN

## 2013-08-19 MED ORDER — IPRATROPIUM-ALBUTEROL 0.5-2.5 (3) MG/3ML IN SOLN
3.0000 mL | RESPIRATORY_TRACT | Status: DC
  Administered 2013-08-19 (×3): 3 mL via RESPIRATORY_TRACT
  Filled 2013-08-19 (×3): qty 3

## 2013-08-19 NOTE — Progress Notes (Signed)
Patient was screened by Stevan Eberwein for appropriateness for an Inpatient Acute Rehab consult.  At this time, we are recommending Inpatient Rehab consult.  Please order when you feel appropriate.   [place] PT Inpatient Rehab Admissions Coordinator Cell [phone] Office [phone]  

## 2013-08-19 NOTE — Progress Notes (Signed)
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 No acute issue overnight per nursing staff. However, she continue to be dyspnic and can only speak in short sentences.   CURRENT MEDS . allopurinol  100 mg Oral Daily  . aspirin EC  81 mg Oral Daily  . calcitRIOL  0.25 mcg Oral Daily  . calcium acetate  667 mg Oral TID WC  . carvedilol  3.125 mg Oral BID WC  . clopidogrel  75 mg Oral Q breakfast  . furosemide  160 mg Intravenous Q6H  . gabapentin  300 mg Oral TID  . insulin aspart  0-15 Units Subcutaneous TID WC  . insulin aspart  0-5 Units Subcutaneous QHS  . insulin glargine  36 Units Subcutaneous QHS  . ipratropium-albuterol  3 mL Nebulization Q4H  . isosorbide mononitrate  60 mg Oral Daily  . latanoprost  1 drop Both Eyes QHS  . levothyroxine  75 mcg Oral QAC breakfast  . linagliptin  5 mg Oral Daily  . multivitamin  1 tablet Oral QHS  . nortriptyline  50 mg Oral QHS  . sevelamer carbonate  800 mg Oral TID WC  . simvastatin  20 mg Oral q1800  . sodium bicarbonate  1,300 mg Oral BID  . sodium chloride  3 mL Intravenous Q12H    OBJECTIVE  Filed Vitals:   08/18/13 1300 08/18/13 2034 08/19/13 0613 08/19/13 0745  BP: 149/51 125/45 117/75   Pulse: 56 58 58   Temp: 97.2 F (36.2 C) 98.1 F (36.7 C) 98 F (36.7 C)   TempSrc: Oral Oral Oral   Resp: 16 18 16    Height:      Weight:    126 lb 3.2 oz (57.244 kg)  SpO2: 100% 97% 100%      Intake/Output Summary (Last 24 hours) at 08/19/13 1031 Last data filed at 08/19/13 0746  Gross per 24 hour  Intake    806 ml  Output   1125 ml  Net   -319 ml   Filed Weights   08/17/13 0650 08/18/13 0539 08/19/13 0745  Weight: 127 lb 10.3 oz (57.9 kg) 128 lb 15.5 oz (58.5 kg) 126 lb 3.2 oz (57.244 kg)    PHYSICAL EXAM  General: Pleasant, NAD. Weak, frail appearing.  Neuro: Alert and oriented X 3. Moves all extremities spontaneously. Slow to answer questions, did not focus initially, only focused after repeated question Psych: Normal affect. HEENT:  Normal  Neck: Supple without bruits  Lungs:  Resp regular and unlabored. Marked inspiratory rales in bilateral basilar lungs, mild expiratory wheezing Heart: RRR no s3, s4. Loud S2, 1/6 systolic murmur Abdomen: Soft, non-tender, BS + x 4. Abdomen distended. Patient's stomach jerk to palpation despite claiming no pain. Extremities: No clubbing, cyanosis or edema. Radials 2+ and equal bilaterally. LLE amputation.   Accessory Clinical Findings  CBC  Recent Labs  08/19/13 0451  WBC 6.8  HGB 8.4*  HCT  25.9*  MCV 83.0  PLT PLATELET CLUMPS NOTED ON SMEAR, COUNT APPEARS ADEQUATE   Basic Metabolic Panel  Recent Labs  08/18/13 0440 08/19/13 0451  NA 134* 133*  K 5.1 5.1  CL 97 96  CO2 18* 19  GLUCOSE 131* 119*  BUN 98* 96*  CREATININE 3.67* 3.45*  CALCIUM 8.7 9.0  PHOS  --  5.1*   Liver Function Tests  Recent Labs  08/19/13 0451  AST 19  ALT 14  ALKPHOS 76  BILITOT 0.3  PROT 6.3  ALBUMIN 2.5*    TELE  HR 50-60s, difficult to assess p wave. Questionable stable junctional rhythm vs sinus rhythm with obscure p wave (appears to be similar to EKG finding on 5/30)  ECG  5/30 possible junctional rhythm with HR 50s, nonspecific t wave inversion  Radiology/Studies  Dg Chest Port 1 View  08/19/2013   CLINICAL DATA:  Shortness of breath.  EXAM: PORTABLE CHEST - 1 VIEW  COMPARISON:  08/15/2013  FINDINGS:  Cardiomegaly. Vascular congestion with increasing interstitial prominence and bibasilar opacities. Favor edema/CHF. Bibasilar opacities could also reflect atelectasis. Small bilateral pleural effusions. No acute bony abnormality.  IMPRESSION: Increasing vascular congestion and probable mild interstitial edema/ CHF.  Small bilateral effusions.  Bibasilar edema or atelectasis.   Electronically Signed   By: Charlett NoseKevin  Dover M.D.   On: 08/19/2013 10:17   Dg Chest Port 1 View  08/15/2013   CLINICAL DATA:  Short of breath  EXAM: PORTABLE CHEST - 1 VIEW  COMPARISON:  Radiograph 06/17/2013  FINDINGS: Normal mediastinum and cardiac silhouette. Normal pulmonary vasculature. No effusion, infiltrate, pneumothorax. Mild left basilar atelectasis.  IMPRESSION: Mild left basilar atelectasis.  No pulmonary edema.   Electronically Signed   By: Genevive BiStewart  Edmunds M.D.   On: 08/15/2013 15:48    ASSESSMENT AND PLAN  1.  acute on chronic diastolic heart failure: in the setting of dietary indiscretion and class IV-V CKD  - output remain poor despite IV lasix increase   - Echo 5/29 EF 50-55%, mild to mod MR, PA peak pressure 39 mmHg   - strict I/O, daily weight 2. stage 4-5 renal failure pending fistula insertion   - was on transient HD Oct 2014  - failed LUA AV fistula placement, kept putting off access placement earlier this yr  - nephrology increased IV lasix from 80mg  daily to 160mg  q6hrs in pm of 5/31  - I/O -53 since admission, -685 today.   - will need further diuresis however low sensitivity of lasix an issue  - CXR this am shows increased vascular congestion and bibasilar interstitial edema  - may need temp cath access placement and HD, however will defer to nephrology 3. long-standing uncontrolled hypertension  4. long-standing diabetes  5. coronary artery disease: continue ASA and plavix 6. Hypothyroidism 7. Anemia: 2/2 CKD, however worsened since Mar 2015. Currently stable >8  * unclear if pt's telemetry  shows junctional rhythm vs atrial thythm, stable since admission based on EKG. Only on low dose coreg 3.125 mg BID  Ramond DialSigned, Hao Meng PA Pager: [phone]  I have seen and evaluated the patient this pm along with Azalee CourseHao Meng PA. I agree with his findings, examination as well as impression recommendations.  Unfortunately, we are limited as to options to help - currently she is making urine with high dose diuretics.  Ef is fine, so we are more concerned with afterload reduction for diastolic dysfunction.  PA pressures by echo are not overly elevated.  Will defer backing off antihypertensives to nephrology -- just talked with Dr. Marisue Humble who feels we are fine with current Rx.  Not a great candidate to consider long term HD -- hoping for slow improvement with diuresis.  Will monitor tele - if rates are reduced, will need to back off of Coreg.   Marykay Lex, M.D., M.S. Interventional Cardiologist   Pager # [phone] 08/19/2013

## 2013-08-19 NOTE — Progress Notes (Signed)
[facility] PROGRESS NOTE  [name] WYS:[id number] DOB: 02/05/1924 DOA: 08/15/2013 PCP: Georgianne Fick, MD  Assessment/Plan: Acute respiratory failure with hypoxia  Likely secondary to acute diastolic CHF exacerbation  Daily weight and strict intake and output  Continue her home meds: coreg, imdur, hydralazine, Norvasc, aspirin and plavix  Wt of 57.9kg->58.5kg overnight - usual weight normally around 55kg Increased resp effort this AM with decreased BS and end-expiratory wheezing Will check repeat CXR and start duonebs PRN and q4hrs scheduled Acute diastolic CHF (congestive heart failure)  Continue coreg  Lasix IV daily initially at 80mg , increased to 160mg  Cont with daily weight and strict intake and output  Replete electrolytes as need  2 D ECHO with EF 50-55% Thus far net pos 7cc since admit Appreciate input from Cardiology and Nephrology CAD - moderate at cath July 2012 (medical Rx)  Continue aspirin and plavix Hypothyroidism  Continue levothyroxine Peripheral vascular disease  Status post left BKA  Continue aspirin and plavix Dyslipidemia  Continue statin therapy  Chronic renal insufficiency, stage IV (severe)  Continue calcitrol, phoslo and renvela Cr slowly rising Pt currently being considered for outpt HD. Therefore, per above, would also consider involving Nephrology as well Anemia of chronic disease  Secondary to CKD  Hemoglobin stable  No current indications for transfusion  Hyperkalemia   Improved Cont to follow lytes  Code Status: Full Family Communication: Pt in room Disposition Plan: Pending   Consultants:  Cardiology  Nephrology  Procedures:    Antibiotics:    HPI/Subjective: Pt denies complaints. Denies SOB  Objective: Filed Vitals:   08/18/13 1300 08/18/13 2034 08/19/13 0613 08/19/13 0745  BP: 149/51 125/45 117/75   Pulse: 56 58 58   Temp: 97.2 F (36.2 C) 98.1 F (36.7 C) 98 F (36.7 C)   TempSrc: Oral Oral  Oral   Resp: 16 18 16    Height:      Weight:    57.244 kg (126 lb 3.2 oz)  SpO2: 100% 97% 100%     Intake/Output Summary (Last 24 hours) at 08/19/13 0815 Last data filed at 08/19/13 0746  Gross per 24 hour  Intake    809 ml  Output   1125 ml  Net   -316 ml   Filed Weights   08/17/13 0650 08/18/13 0539 08/19/13 0745  Weight: 57.9 kg (127 lb 10.3 oz) 58.5 kg (128 lb 15.5 oz) 57.244 kg (126 lb 3.2 oz)    Exam:   General:  Awake, in nad  Cardiovascular: regular, s1, s2  Respiratory: increased resp effort, end-expiratory wheezing B, crackles R>L  Abdomen: soft, nondistended  Musculoskeletal: perfused, no clubbing   Data Reviewed: Basic Metabolic Panel:  Recent Labs Lab 08/15/13 2015 08/16/13 1010 08/17/13 0400 08/18/13 0440 08/19/13 0451  NA 137 138 139 134* 133*  K 5.7* 5.1 4.5 5.1 5.1  CL 100 102 102 97 96  CO2 18* 17* 22 18* 19  GLUCOSE 128* 109* 100* 131* 119*  BUN 79* 81* 89* 98* 96*  CREATININE 3.04* 3.10* 3.28* 3.67* 3.45*  CALCIUM 8.7 8.8 9.0 8.7 9.0  MG 2.6*  --   --   --   --   PHOS  --   --   --   --  5.1*   Liver Function Tests:  Recent Labs Lab 08/15/13 1424 08/15/13 2015 08/19/13 0451  AST 23 29 19   ALT 20 20 14   ALKPHOS 83 86 76  BILITOT 0.5 0.6 0.3  PROT 7.4 7.1 6.3  ALBUMIN  3.1* 3.0* 2.5*   No results found for this basename: LIPASE, AMYLASE,  in the last 168 hours No results found for this basename: AMMONIA,  in the last 168 hours CBC:  Recent Labs Lab 08/15/13 1424 08/19/13 0451  WBC 10.5 6.8  NEUTROABS 8.1*  --   HGB 8.6* 8.4*  HCT 27.5* 25.9*  MCV 85.7 83.0  PLT 357 PLATELET CLUMPS NOTED ON SMEAR, COUNT APPEARS ADEQUATE   Cardiac Enzymes: No results found for this basename: CKTOTAL, CKMB, CKMBINDEX, TROPONINI,  in the last 168 hours BNP (last 3 results)  Recent Labs  06/10/13 1645 08/15/13 1424 08/15/13 2015  PROBNP 1297.00* 30760.0* 31926.0*   CBG:  Recent Labs Lab 08/18/13 0545 08/18/13 1201  08/18/13 1623 08/18/13 2041 08/19/13 0541  GLUCAP 125* 96 115* 123* 108*    No results found for this or any previous visit (from the past 240 hour(s)).   Studies: No results found.  Scheduled Meds: . allopurinol  100 mg Oral Daily  . aspirin EC  81 mg Oral Daily  . calcitRIOL  0.25 mcg Oral Daily  . calcium acetate  667 mg Oral TID WC  . carvedilol  3.125 mg Oral BID WC  . clopidogrel  75 mg Oral Q breakfast  . furosemide  160 mg Intravenous Q6H  . gabapentin  300 mg Oral TID  . insulin aspart  0-15 Units Subcutaneous TID WC  . insulin aspart  0-5 Units Subcutaneous QHS  . insulin glargine  36 Units Subcutaneous QHS  . isosorbide mononitrate  60 mg Oral Daily  . latanoprost  1 drop Both Eyes QHS  . levothyroxine  75 mcg Oral QAC breakfast  . linagliptin  5 mg Oral Daily  . multivitamin  1 tablet Oral QHS  . nortriptyline  50 mg Oral QHS  . sevelamer carbonate  800 mg Oral TID WC  . simvastatin  20 mg Oral q1800  . sodium bicarbonate  1,300 mg Oral BID  . 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 If 7PM-7AM, please contact night-coverage at [email or web address], password [email or web address] 08/19/2013, 8:15 AM  LOS: 4 days

## 2013-08-19 NOTE — Progress Notes (Signed)
Admit: 08/15/2013 LOS: 4  54F w/ AoC diastolic CHF exacerbation and CKD4 w/ volume overload  Subjective:  Now on 160 IV QID of lasix, some improved UOP Sitting in chair, breathing appears more comfortable No complaints  05/31 0701 - 06/01 0700 In: 809 [P.O.:740; I.V.:3; IV Piggyback:66] Out: 500 [Urine:500]  Filed Weights   08/17/13 0650 08/18/13 0539 08/19/13 0745  Weight: 57.9 kg (127 lb 10.3 oz) 58.5 kg (128 lb 15.5 oz) 57.244 kg (126 lb 3.2 oz)    Current meds: reviewed includes lasix 160 QID, PhosLo 1qAC, Renveal 1qAC, Calcitriol 0.25 daily, NaHCO3 1300 BID Current Labs: reviewed    Physical Exam:  Blood pressure 117/75, pulse 58, temperature 98 F (36.7 C), temperature source Oral, resp. rate 16, height 4\' 11"  (1.499 m), weight 57.244 kg (126 lb 3.2 oz), SpO2 100.00%. Elderly chronically ill female in chair Nl WOB, coarse bs anteriorly RRR. Nl s1s2 1+ Pitting edema on RLE S/nt Nonfocal  Assessment 1. CKD4 2. Volume overload 3. A/C Diastolic HF 4. Hyperphosphatemia on PhosLo/Renvela 5. HPTH on VDRA 6. Anemia TSAT 8% 7. Metabolic Acidosis on NaHCO3 1300 BID  Plan 1. Cont diuresis for now, sems to be responding somewhat today but still very well could req RRT 2. Cont to follow closely 3. Feraheme 510 IV x1 4. Cont binders, VDRA 5. Cont NaHCO3  Sabra Heck MD 08/19/2013, 12:56 PM   Recent Labs Lab 08/17/13 0400 08/18/13 0440 08/19/13 0451  NA 139 134* 133*  K 4.5 5.1 5.1  CL 102 97 96  CO2 22 18* 19  GLUCOSE 100* 131* 119*  BUN 89* 98* 96*  CREATININE 3.28* 3.67* 3.45*  CALCIUM 9.0 8.7 9.0  PHOS  --   --  5.1*    Recent Labs Lab 08/15/13 1424 08/19/13 0451  WBC 10.5 6.8  NEUTROABS 8.1*  --   HGB 8.6* 8.4*  HCT 27.5* 25.9*  MCV 85.7 83.0  PLT 357 PLATELET CLUMPS NOTED ON SMEAR, COUNT APPEARS ADEQUATE

## 2013-08-19 NOTE — Evaluation (Signed)
Occupational Therapy Evaluation Patient Details Name: [name] MRN: [id number] DOB: 21-Jul-1923 Today's Date: 08/19/2013    History of Present Illness 80 year old female with past medical history significant for diastolic CHF, last 2 D ECHO in 2014 showed EF 65%, left BKA, hypothyroidism, CKD stage IV, dyslipidemia who presented to [facility] ED 08/15/2013 with worsening shortness of breath started about few days prior to this admission and getting worse in past 24 hours associated with nonproductive cough. Pt reported feeling short of breath with rest and with exertion   Clinical Impression   Pt presents with below problem list. Feel pt will benefit from acute OT to increase independence. Recommending CIR for additional rehab prior to d/c.     Follow Up Recommendations  CIR    Equipment Recommendations  Other (comment) (defer to next venue)    Recommendations for Other Services Rehab consult     Precautions / Restrictions Precautions Precautions: Fall Precaution Comments: weak with poor trunk stability Restrictions Weight Bearing Restrictions: No Other Position/Activity Restrictions: RUE with significant edema, prop on pillows      Mobility Bed Mobility Overal bed mobility: Needs Assistance Bed Mobility: Supine to Sit     Supine to sit: Max assist     General bed mobility comments: Assistance with hips/LE's and also trunk. Cues for technique.  Transfers Overall transfer level: Needs assistance   Transfers: Lateral/Scoot Transfers          Lateral/Scoot Transfers: Total assist General transfer comment: heavy assist to transfer to chair. Cues for technique. Pt with weak trunk.    Balance Overall balance assessment: Needs assistance Sitting-balance support: Feet supported;Feet unsupported (LUE supported; Rt foot supported at times) Sitting balance-Leahy Scale: Poor                                      ADL Overall ADL's : Needs  assistance/impaired Eating/Feeding: Sitting;Moderate assistance;Maximal assistance   Grooming: Oral care;Wash/dry face;Sitting;Moderate assistance;Maximal assistance   Upper Body Bathing: Sitting;Maximal assistance   Lower Body Bathing: Maximal assistance;Sitting/lateral leans   Upper Body Dressing : Sitting;Maximal assistance   Lower Body Dressing: Total assistance;Sitting/lateral leans   Toilet Transfer: Total assistance (lateral scoot)   Toileting- Clothing Manipulation and Hygiene: Total assistance;Sitting/lateral lean       Functional mobility during ADLs: Total assistance (lateral scoot) General ADL Comments: Pt performed oral care and washed face sitting in chair-OT assisted. Pt had difficulty holding items. Pt washed under arms and washed tops of legs with assistance. Educated family on retrograde massage and elevating RUE. Limited by fatigue-educated on deep breathing technique. Pt requiring heavy assist for balance sitting EOB.     Vision                     Perception     Praxis      Pertinent Vitals/Pain C/o pain in RLE. Elevated on pillow.      Hand Dominance Right   Extremity/Trunk Assessment Upper Extremity Assessment Upper Extremity Assessment: RUE deficits/detail;LUE deficits/detail RUE Deficits / Details: edema in RUE. History of rotator cuff tear in past. Limited AROM shoulder flexion; weak grasp LUE Deficits / Details: approximately 90 degrees AROM shoulder flexion; weak grasp   Lower Extremity Assessment Lower Extremity Assessment: Defer to PT evaluation       Communication Communication Communication: Other (comment) (weak vocalization)   Cognition Arousal/Alertness: Awake/alert Behavior During Therapy: Flat affect Overall  Cognitive Status: Difficult to assess Area of Impairment: Attention                   General Comments       Exercises       Shoulder Instructions      Home Living Family/patient expects to be  discharged to:: Private residence Living Arrangements: Children;Other relatives (sister in law) Available Help at Discharge: Family;Available PRN/intermittently Type of Home: House Home Access: Ramped entrance     Home Layout: One level               Home Equipment: Bedside commode;Shower seat;Walker - 2 wheels;Wheelchair - power          Prior Functioning/Environment Level of Independence: Needs assistance  Gait / Transfers Assistance Needed: pt states she's needed assist for bed>WC transfers ADL's / Homemaking Assistance Needed: family does all meals and housework, Min A for bathing and able to dress herself   Comments: sister in law helps with home/meals, daughter drives her shopping/appts    OT Diagnosis: Generalized weakness   OT Problem List: Pain;Decreased strength;Impaired balance (sitting and/or standing);Decreased activity tolerance;Decreased knowledge of use of DME or AE;Decreased knowledge of precautions;Cardiopulmonary status limiting activity;Increased edema   OT Treatment/Interventions: Self-care/ADL training;DME and/or AE instruction;Therapeutic activities;Patient/family education;Balance training;Therapeutic exercise    OT Goals(Current goals can be found in the care plan section) Acute Rehab OT Goals Patient Stated Goal: not stated OT Goal Formulation: With patient Time For Goal Achievement: 08/26/13 Potential to Achieve Goals: Good ADL Goals Pt Will Perform Eating: with adaptive utensils;sitting;with set-up;with supervision Pt Will Perform Grooming: with set-up;with supervision;with adaptive equipment;sitting Pt Will Perform Upper Body Bathing: with min assist;sitting Pt Will Perform Upper Body Dressing: with min assist;sitting Pt Will Transfer to Toilet: with mod assist;with transfer board;bedside commode Additional ADL Goal #1: Pt/family will be independent with HEP for RUE and edema management techniques.  OT Frequency: Min 3X/week   Barriers to  D/C:            Co-evaluation              End of Session Equipment Utilized During Treatment: Gait belt;Oxygen  Activity Tolerance: Patient limited by fatigue Patient left: in chair;with call bell/phone within reach;with family/visitor present   Time: [id number] OT Time Calculation (min): 29 min Charges:  OT General Charges $OT Visit: 1 Procedure OT Evaluation $Initial OT Evaluation Tier I: 1 Procedure OT Treatments $Self Care/Home Management : 8-22 mins G-Codes:    Earlie RavelingLindsey L Brogan England OTR/L [phone] 08/19/2013, 1:00 PM

## 2013-08-20 ENCOUNTER — Encounter (HOSPITAL_COMMUNITY): Payer: Self-pay | Admitting: Physician Assistant

## 2013-08-20 DIAGNOSIS — R0902 Hypoxemia: Secondary | ICD-10-CM

## 2013-08-20 DIAGNOSIS — I739 Peripheral vascular disease, unspecified: Secondary | ICD-10-CM

## 2013-08-20 LAB — BASIC METABOLIC PANEL
BUN: 90 mg/dL — ABNORMAL HIGH (ref 6–23)
CHLORIDE: 98 meq/L (ref 96–112)
CO2: 26 mEq/L (ref 19–32)
Calcium: 9.9 mg/dL (ref 8.4–10.5)
Creatinine, Ser: 3.19 mg/dL — ABNORMAL HIGH (ref 0.50–1.10)
GFR calc non Af Amer: 12 mL/min — ABNORMAL LOW (ref >90)
GFR, EST AFRICAN AMERICAN: 14 mL/min — AB (ref >90)
Glucose, Bld: 40 mg/dL — CL (ref 70–99)
POTASSIUM: 4.6 meq/L (ref 3.7–5.3)
SODIUM: 140 meq/L (ref 137–147)

## 2013-08-20 LAB — GLUCOSE, CAPILLARY
GLUCOSE-CAPILLARY: 37 mg/dL — AB (ref 70–99)
GLUCOSE-CAPILLARY: 170 mg/dL — AB (ref 70–99)
GLUCOSE-CAPILLARY: 140 mg/dL — AB (ref 70–99)
Glucose-Capillary: 108 mg/dL — ABNORMAL HIGH (ref 70–99)
Glucose-Capillary: 159 mg/dL — ABNORMAL HIGH (ref 70–99)

## 2013-08-20 MED ORDER — DEXTROSE 50 % IV SOLN
INTRAVENOUS | Status: AC
  Filled 2013-08-20: qty 50

## 2013-08-20 MED ORDER — DEXTROSE 50 % IV SOLN
INTRAVENOUS | Status: AC
  Administered 2013-08-20: 25 mL
  Filled 2013-08-20: qty 50

## 2013-08-20 NOTE — Progress Notes (Addendum)
Patient: Shannon SirenRoberta G Ahmed / Admit Date: 08/15/2013 / Date of Encounter: 08/20/2013, 11:18 AM  Subjective  Denies any symptoms. No CP or SOB. Feels "fine." About to work with PT.  Objective   Telemetry: No longer on tele.  Physical Exam: Blood pressure 138/71, pulse 81, temperature 96.6 F (35.9 C), temperature source Oral, resp. rate 18, height 4\' 11"  (1.499 m), weight 121 lb 14.6 oz (55.3 kg), SpO2 100.00%. General: Well developed elderly AAF in no acute distress. Head: Normocephalic, atraumatic, sclera non-icteric, no xanthomas, nares are without discharge. Neck: JVP not elevated. Lungs: Mild basilar rales. No rales or rhonchi. Breathing is unlabored. Heart: RRR S1 S2 without murmurs, rubs, or gallops.  Abdomen: Soft, non-tender, non-distended with normoactive bowel sounds. No rebound/guarding. Extremities: No clubbing or cyanosis. No edema. S/p L BKA.  Neuro: Alert and oriented X 3. Follows commands.  Psych:  Responds to questions appropriately with a pleasant but somewhat slowed affect.   Intake/Output Summary (Last 24 hours) at 08/20/13 1118 Last data filed at 08/20/13 0900  Gross per 24 hour  Intake    720 ml  Output   2600 ml  Net  -1880 ml    Inpatient Medications:  . allopurinol  100 mg Oral Daily  . aspirin EC  81 mg Oral Daily  . calcitRIOL  0.25 mcg Oral Daily  . calcium acetate  667 mg Oral TID WC  . carvedilol  3.125 mg Oral BID WC  . clopidogrel  75 mg Oral Q breakfast  . dextrose      . furosemide  160 mg Intravenous Q6H  . gabapentin  300 mg Oral TID  . insulin aspart  0-15 Units Subcutaneous TID WC  . insulin aspart  0-5 Units Subcutaneous QHS  . insulin glargine  36 Units Subcutaneous QHS  . ipratropium-albuterol  3 mL Nebulization TID  . isosorbide mononitrate  60 mg Oral Daily  . latanoprost  1 drop Both Eyes QHS  . levothyroxine  75 mcg Oral QAC breakfast  . linagliptin  5 mg Oral Daily  . multivitamin  1 tablet Oral QHS  . nortriptyline  50 mg  Oral QHS  . sevelamer carbonate  800 mg Oral TID WC  . simvastatin  20 mg Oral q1800  . sodium bicarbonate  1,300 mg Oral BID  . sodium chloride  3 mL Intravenous Q12H   Infusions:    Labs:  Recent Labs  08/19/13 0451 08/20/13 0532  NA 133* 140  K 5.1 4.6  CL 96 98  CO2 19 26  GLUCOSE 119* 40*  BUN 96* 90*  CREATININE 3.45* 3.19*  CALCIUM 9.0 9.9  PHOS 5.1*  --     Recent Labs  08/19/13 0451  AST 19  ALT 14  ALKPHOS 76  BILITOT 0.3  PROT 6.3  ALBUMIN 2.5*    Recent Labs  08/19/13 0451  WBC 6.8  HGB 8.4*  HCT 25.9*  MCV 83.0  PLT PLATELET CLUMPS NOTED ON SMEAR, COUNT APPEARS ADEQUATE     Radiology/Studies:  Dg Chest Port 1 View  08/19/2013   CLINICAL DATA:  Shortness of breath.  EXAM: PORTABLE CHEST - 1 VIEW  COMPARISON:  08/15/2013  FINDINGS: Cardiomegaly. Vascular congestion with increasing interstitial prominence and bibasilar opacities. Favor edema/CHF. Bibasilar opacities could also reflect atelectasis. Small bilateral pleural effusions. No acute bony abnormality.  IMPRESSION: Increasing vascular congestion and probable mild interstitial edema/ CHF.  Small bilateral effusions.  Bibasilar edema or atelectasis.   Electronically Signed  By: Charlett Nose M.D.   On: 08/19/2013 10:17   Dg Chest Port 1 View  08/15/2013   CLINICAL DATA:  Short of breath  EXAM: PORTABLE CHEST - 1 VIEW  COMPARISON:  Radiograph 06/17/2013  FINDINGS: Normal mediastinum and cardiac silhouette. Normal pulmonary vasculature. No effusion, infiltrate, pneumothorax. Mild left basilar atelectasis.  IMPRESSION: Mild left basilar atelectasis.  No pulmonary edema.   Electronically Signed   By: Genevive Bi M.D.   On: 08/15/2013 15:48    2D Echo 08/16/13 - Left ventricle: The cavity size was normal. Wall thickness was normal. Systolic function was normal. The estimated ejection fraction was in the range of 50% to 55%. Wall motion was normal; there were no regional wall motion  abnormalities. - Aortic valve: Mildly calcified annulus. Mildly thickened leaflets. - Mitral valve: Moderately calcified annulus. There was mild to moderate regurgitation. - Pulmonary arteries: Systolic pressure was mildly to moderately increased. PA peak pressure: 39 mm Hg (S).     Assessment and Plan  1. Acute respiratory failure with hypoxia - likely secondary to #2. 2. Acute on chronic diastolic heart failure - in the setting of dietary indiscretion and class IV-V CKD. Echo 5/29 EF 50-55%, mild to mod MR, PA peak pressure 39 mmHg. Nephrology managing diuretics.   We are limited as to options to help and she may ultimately require RRT. ++UOP yesterday. 3. 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 Per renal.  3. Long-standing uncontrolled hypertension - currently controlled. 4. Long-standing diabetes mellitus - per IM. 5. CAD, moderate by cath 09/2010 (for medical therapy) - continue aspirin, Plavix as Hgb allows. Continue BB, statin. No angina. 6. Hypothyroidism - on levothyroxine. 7. Anemia of chronic disease 2/2 CKD - per IM. 8. PVD - s/p L BKA. 9. Hypoalbuminemia - may be indicative of malnutrition. Dietician consult to assess nutritional status.  Signed, Ronie Spies PA-C   ATTENDING ATTESTATION  I have seen and evaluated the patient this AM along with Dayna Dunn PA-C. I agree with her findings, examination as well as impression recommendations.  Finally brisk diuresis on high dose lasix.   HR & BP stable - no need to adjust meds further. Only on low dose Coreg. As BP increases will need to re-institute afterload reduction.  Will follow along - defer most management decisions to IM & Nephrology.  Has PAD with sore R heel & toe -- just had LEA Dopplers done -- followed by [facility].  Marykay Lex, M.D., M.S. Interventional Cardiologist   Pager #  [phone] 08/20/2013

## 2013-08-20 NOTE — Progress Notes (Addendum)
[facility] PROGRESS NOTE  [name]:[id number] DOB: 03-04-1924 DOA: 08/15/2013 PCP: Georgianne Fick, MD  Off Service Summary [phone] with a hx of advanced chronic kidney disease and chronic heart failure who was admitted with acute heart failure. The patient failed to respond to IV lasix boluses so Cardiology and Nephrology were consulted. Ultimately, the patient has since responded to 160mg  lasix q6hrs with good results. The patient has been recommended for CIR by PT. CIR consult has been requested.  Assessment/Plan: Acute respiratory failure with hypoxia  Likely secondary to acute diastolic CHF exacerbation  Daily weight and strict intake and output  Continue her home meds: coreg, imdur, hydralazine, Norvasc, aspirin and plavix  Wt of 58.5kg->55.3kg overnight - usual weight normally around 55kg Increased resp effort with decreased BS and end-expiratory wheezing on 6/1 with repeat CXR demonstrating worsened pulm edema Nephrology assisting with diuresis. Currently on 160mg  IV lasix q6hrs - may very well require RRT in the future Currently remains on 3LNC. Pt is home O2 naive Acute diastolic CHF (congestive heart failure)  Continue coreg  Lasix IV daily at 160mg  Cont with daily weight and strict intake and output  Replete electrolytes as need  2 D ECHO with EF 50-55% Thus far net neg 1.9L since admit Appreciate input from Cardiology and Nephrology CAD - moderate at cath July 2012 (medical Rx)  Continue aspirin and plavix Hypothyroidism  Continue levothyroxine Peripheral vascular disease  Status post left BKA  Continue aspirin and plavix Dyslipidemia  Continue statin therapy  Chronic renal insufficiency, stage IV (severe)  Continue calcitrol, phoslo and renvela Cr slowly rising Appreciate Nephrology assistance Aggressive diuresis currently Anemia of chronic disease  Secondary to CKD  Hemoglobin stable  No current indications for transfusion  Hyperkalemia    Improved Cont to follow lytes  Code Status: Full Family Communication: Pt in room Disposition Plan: Pending   Consultants:  Cardiology  Nephrology  Procedures:    Antibiotics:    HPI/Subjective: No acute events overnight. Pt reports breathing "fine."  Objective: Filed Vitals:   08/19/13 1946 08/19/13 2136 08/20/13 0611 08/20/13 0739  BP:  132/68 115/87   Pulse:  60 64   Temp:  96.3 F (35.7 C) 96.6 F (35.9 C)   TempSrc:   Oral   Resp:  18 17   Height:      Weight:   55.3 kg (121 lb 14.6 oz)   SpO2: 95% 98% 99% 99%    Intake/Output Summary (Last 24 hours) at 08/20/13 0748 Last data filed at 08/20/13 5615  Gross per 24 hour  Intake    960 ml  Output   2900 ml  Net  -1940 ml   Filed Weights   08/18/13 0539 08/19/13 0745 08/20/13 0611  Weight: 58.5 kg (128 lb 15.5 oz) 57.244 kg (126 lb 3.2 oz) 55.3 kg (121 lb 14.6 oz)    Exam:   General:  Awake, in nad  Cardiovascular: regular, s1, s2  Respiratory: increased resp effort, end-expiratory wheezing B, crackles R>L  Abdomen: soft, nondistended  Musculoskeletal: perfused, no clubbing   Data Reviewed: Basic Metabolic Panel:  Recent Labs Lab 08/15/13 2015 08/16/13 1010 08/17/13 0400 08/18/13 0440 08/19/13 0451 08/20/13 0532  NA 137 138 139 134* 133* 140  K 5.7* 5.1 4.5 5.1 5.1 4.6  CL 100 102 102 97 96 98  CO2 18* 17* 22 18* 19 26  GLUCOSE 128* 109* 100* 131* 119* 40*  BUN 79* 81* 89* 98* 96* 90*  CREATININE 3.04* 3.10*  3.28* 3.67* 3.45* 3.19*  CALCIUM 8.7 8.8 9.0 8.7 9.0 9.9  MG 2.6*  --   --   --   --   --   PHOS  --   --   --   --  5.1*  --    Liver Function Tests:  Recent Labs Lab 08/15/13 1424 08/15/13 2015 08/19/13 0451  AST 23 29 19   ALT 20 20 14   ALKPHOS 83 86 76  BILITOT 0.5 0.6 0.3  PROT 7.4 7.1 6.3  ALBUMIN 3.1* 3.0* 2.5*   No results found for this basename: LIPASE, AMYLASE,  in the last 168 hours No results found for this basename: AMMONIA,  in the last 168  hours CBC:  Recent Labs Lab 08/15/13 1424 08/19/13 0451  WBC 10.5 6.8  NEUTROABS 8.1*  --   HGB 8.6* 8.4*  HCT 27.5* 25.9*  MCV 85.7 83.0  PLT 357 PLATELET CLUMPS NOTED ON SMEAR, COUNT APPEARS ADEQUATE   Cardiac Enzymes: No results found for this basename: CKTOTAL, CKMB, CKMBINDEX, TROPONINI,  in the last 168 hours BNP (last 3 results)  Recent Labs  06/10/13 1645 08/15/13 1424 08/15/13 2015  PROBNP 1297.00* 30760.0* 31926.0*   CBG:  Recent Labs Lab 08/19/13 1123 08/19/13 1619 08/19/13 2105 08/20/13 0623 08/20/13 0650  GLUCAP 103* 92 88 37* 170*    No results found for this or any previous visit (from the past 240 hour(s)).   Studies: Dg Chest Port 1 View  08/19/2013   CLINICAL DATA:  Shortness of breath.  EXAM: PORTABLE CHEST - 1 VIEW  COMPARISON:  08/15/2013  FINDINGS: Cardiomegaly. Vascular congestion with increasing interstitial prominence and bibasilar opacities. Favor edema/CHF. Bibasilar opacities could also reflect atelectasis. Small bilateral pleural effusions. No acute bony abnormality.  IMPRESSION: Increasing vascular congestion and probable mild interstitial edema/ CHF.  Small bilateral effusions.  Bibasilar edema or atelectasis.   Electronically Signed   By: Charlett Nose M.D.   On: 08/19/2013 10:17    Scheduled Meds: . allopurinol  100 mg Oral Daily  . aspirin EC  81 mg Oral Daily  . calcitRIOL  0.25 mcg Oral Daily  . calcium acetate  667 mg Oral TID WC  . carvedilol  3.125 mg Oral BID WC  . clopidogrel  75 mg Oral Q breakfast  . dextrose      . furosemide  160 mg Intravenous Q6H  . gabapentin  300 mg Oral TID  . insulin aspart  0-15 Units Subcutaneous TID WC  . insulin aspart  0-5 Units Subcutaneous QHS  . insulin glargine  36 Units Subcutaneous QHS  . ipratropium-albuterol  3 mL Nebulization TID  . isosorbide mononitrate  60 mg Oral Daily  . latanoprost  1 drop Both Eyes QHS  . levothyroxine  75 mcg Oral QAC breakfast  . linagliptin  5 mg Oral  Daily  . multivitamin  1 tablet Oral QHS  . nortriptyline  50 mg Oral QHS  . sevelamer carbonate  800 mg Oral TID WC  . simvastatin  20 mg Oral q1800  . sodium bicarbonate  1,300 mg Oral BID  . 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 If 7PM-7AM, please contact night-coverage at [email or web address], password [email or web address] 08/20/2013, 7:48 AM  LOS: 5 days

## 2013-08-20 NOTE — Progress Notes (Signed)
CRITICAL VALUE ALERT  Critical value received:  Glucose = 40  Date of notification:  08/20/2013  Time of notification:  0637  Critical value read back:yes  Nurse who received alert:  Louretta Parma, RN  Christen Bame, Care RN made aware.

## 2013-08-20 NOTE — Progress Notes (Signed)
Occupational Therapy Treatment Patient Details Name: [name] MRN: [name]35 DOB: 03/28/1923 Today's Date: 08/20/2013    History of present illness 80 year old female with past medical history significant for diastolic CHF, last 2 D ECHO in 2014 showed EF 65%, left BKA, hypothyroidism, CKD stage IV, dyslipidemia [facility] with worsening shortness of breath started about few days prior to this admission and getting worse in past 24 hours associated with nonproductive cough. Pt reported feeling short of breath with rest and with exertion   OT comments  Very difficult to get a prior level of function on this pt. Grandson unsure.  Pt states she was fully I from w/c level but now is dependent.  Pt needed encouragement to get out of bed and do for herself but did get into chair.  Feeding addressed and grandson encouraged to let pt feed self.  Follow Up Recommendations  CIR    Equipment Recommendations  Other (comment)    Recommendations for Other Services Rehab consult    Precautions / Restrictions Precautions Precautions: Fall Precaution Comments: weak with poor trunk stability Restrictions Weight Bearing Restrictions: No Other Position/Activity Restrictions: RUE with significant edema, prop on pillows       Mobility Bed Mobility Overal bed mobility: Needs Assistance Bed Mobility: Supine to Sit     Supine to sit: Max assist     General bed mobility comments: Assistance with hips/LE's and also trunk. Cues for technique.  Transfers Overall transfer level: Needs assistance Equipment used: 2 person hand held assist Transfers: Lateral/Scoot Transfers          Lateral/Scoot Transfers: Total assist General transfer comment: heavy assist to transfer to chair. Cues for technique. Pt with weak trunk.    Balance Overall balance assessment: Needs assistance Sitting-balance support: Bilateral upper extremity supported Sitting balance-Leahy Scale:  Poor Sitting balance - Comments: poor sitting stability with limited ability to maintain neutral, requires 2 UE support and fagitues, leans to left and posterior                           ADL Overall ADL's : Needs assistance/impaired Eating/Feeding: Sitting;Moderate assistance                   Lower Body Dressing: Total assistance;Sitting/lateral leans Lower Body Dressing Details (indicate cue type and reason): Pt unable to even attempt to dress at chair level due to her poor postural control.  WIll attempt next session at bed level to start. Toilet Transfer: Total assistance Toilet Transfer Details (indicate cue type and reason): slide board to toilet and back.  Pt unable to push down and use RUE much due to pain in shoulder from rotator cuff. Toileting- Clothing Manipulation and Hygiene: Total assistance;Sitting/lateral lean       Functional mobility during ADLs: Total assistance General ADL Comments: PTs swelling inRUE seems improved. Pt able to grip will with RUE at this time. Encouraged family to let pt do for herself as granndson feeding pt on arrival.      Vision                     Perception     Praxis      Cognition   Behavior During Therapy: Flat affect Overall Cognitive Status: Difficult to assess Area of Impairment: Attention;Awareness;Problem solving   Current Attention Level: Sustained        Awareness: Emergent Problem Solving: Slow processing  General Comments: Pt has very little to say and seems to have her timeline of events off a bit.  Pt states she was I PTA from a w/c level but now is fully dependent.  Feel pt may have needed more assist PTA than what she recalls.    Extremity/Trunk Assessment               Exercises     Shoulder Instructions       General Comments      Pertinent Vitals/ Pain       Pt not using O2 on OT arrival.  At end of session O2 sat 83%.  Nursing notified.  O2 replaced at 3.5 L and O2  sats up to 90%.   Home Living                                          Prior Functioning/Environment              Frequency Min 3X/week     Progress Toward Goals  OT Goals(current goals can now be found in the care plan section)  Progress towards OT goals: Progressing toward goals  Acute Rehab OT Goals Patient Stated Goal: not stated OT Goal Formulation: With patient Time For Goal Achievement: 08/26/13 Potential to Achieve Goals: Good ADL Goals Pt Will Perform Eating: with adaptive utensils;sitting;with set-up;with supervision Pt Will Perform Grooming: with set-up;with supervision;with adaptive equipment;sitting Pt Will Perform Upper Body Bathing: with min assist;sitting Pt Will Perform Upper Body Dressing: with min assist;sitting Pt Will Transfer to Toilet: with mod assist;with transfer board;bedside commode Additional ADL Goal #1: Pt/family will be independent with HEP for RUE and edema management techniques.  Plan Discharge plan remains appropriate    Co-evaluation                 End of Session Equipment Utilized During Treatment: Gait belt;Oxygen   Activity Tolerance Patient limited by fatigue   Patient Left in chair;with call bell/phone within reach;with family/visitor present   Nurse Communication Mobility status;Other (comment) (O2 sat at 83% wo O2)        Time: 9038-3338 OT Time Calculation (min): 45 min  Charges: OT General Charges $OT Visit: 1 Procedure OT Treatments $Self Care/Home Management : 23-37 mins  Brayton Layman 08/20/2013, 12:04 PM [phone]

## 2013-08-20 NOTE — Progress Notes (Signed)
Physical Therapy Treatment Patient Details Name: Shannon SirenRoberta G Bellina MRN: [id number] DOB: 12/12/1923 Today's Date: 08/20/2013    History of Present Illness 80 year old female with past medical history significant for diastolic CHF, last 2 D ECHO in 2014 showed EF 65%, left BKA, hypothyroidism, CKD stage IV, dyslipidemia who [facility] with worsening shortness of breath started about few days prior to this admission and getting worse in past 24 hours associated with nonproductive cough. Pt reported feeling short of breath with rest and with exertion    PT Comments    Pt continues to require total assist +2 for transfers. Unclear exactly how much pt was doing PTA, and grandson reports she was working towards pivoting on the RLE [facility] to returning home. Will continue to follow and progress per POC.   Follow Up Recommendations  CIR     Equipment Recommendations  None recommended by PT    Recommendations for Other Services Rehab consult     Precautions / Restrictions Precautions Precautions: Fall Precaution Comments: weak with poor trunk stability Restrictions Weight Bearing Restrictions: No Other Position/Activity Restrictions: RUE with significant edema, prop on pillows    Mobility  Bed Mobility Overal bed mobility: Needs Assistance Bed Mobility: Supine to Sit     Supine to sit: Max assist;+2 for physical assistance     General bed mobility comments: Assistance with hips/LE's and also trunk. Hand over hand assist to reach for bed rails for support, and cues for technique. Bed pad used for scooting pt to EOB.  Transfers Overall transfer level: Needs assistance Equipment used: 2 person hand held assist Transfers: Lateral/Scoot Transfers          Lateral/Scoot Transfers: Total assist;+2 physical assistance General transfer comment: Pt unable to maintain erect posture and was leaning again OT for trunk support. Bed pad used to assist with  scooting pt from bed to drop-arm recliner. Pt with little to no active movement or support of UE's during this time.   Ambulation/Gait                 Stairs            Wheelchair Mobility    Modified Rankin (Stroke Patients Only)       Balance Overall balance assessment: Needs assistance Sitting-balance support: Bilateral upper extremity supported Sitting balance-Leahy Scale: Poor Sitting balance - Comments: poor sitting stability with limited ability to maintain neutral, requires 2 UE support and fagitues, leans to left and posterior                            Cognition Arousal/Alertness: Awake/alert Behavior During Therapy: Flat affect Overall Cognitive Status: Difficult to assess Area of Impairment: Attention;Awareness;Problem solving   Current Attention Level: Sustained       Awareness: Emergent Problem Solving: Slow processing General Comments: Pt has very little to say and seems to have her timeline of events off a bit.  Pt states she was I PTA from a w/c level but now is fully dependent.  Feel pt may have needed more assist PTA than what she recalls.    Exercises      General Comments        Pertinent Vitals/Pain Pt on RA when therapist entered room, and sats dropped to 84% after transfer from bed>chair. Supplemental O2 donned at 3.5L/min and pt was instructed in pursed-lip breathing for ~5 minutes. Sats slowly improved to  92%. RN notified.     Home Living                      Prior Function            PT Goals (current goals can now be found in the care plan section) Acute Rehab PT Goals Patient Stated Goal: not stated PT Goal Formulation: With patient Potential to Achieve Goals: Good Progress towards PT goals: Not progressing toward goals - comment    Frequency  Min 3X/week    PT Plan Current plan remains appropriate    Co-evaluation PT/OT/SLP Co-Evaluation/Treatment: Yes Reason for Co-Treatment: Complexity  of the patient's impairments (multi-system involvement);For patient/therapist safety PT goals addressed during session: Mobility/safety with mobility;Balance       End of Session Equipment Utilized During Treatment: Oxygen (3.5L/min ) Activity Tolerance: Patient limited by fatigue Patient left: in chair;with call bell/phone within reach;with family/visitor present     Time: 2956-2130 PT Time Calculation (min): 28 min  Charges:  $Therapeutic Activity: 8-22 mins                    G Codes:      Ruthann Cancer 11-Sep-2013, 1:45 PM  Ruthann Cancer, PT, DPT Acute Rehabilitation Services Pager: [phone]

## 2013-08-20 NOTE — Progress Notes (Signed)
Admit: 08/15/2013 LOS: 5  58F w/ AoC diastolic CHF exacerbation and CKD4 w/ volume overload  Subjective:  Good diuresis yest on inc dose lasix / freq Off supplemental O2 No co this AM Updated daughters yest afternoon, they expressed some hesitation of RRT if indicated Labs stable rec Feraheme 510 yesterday  06/01 0701 - 06/02 0700 In: 960 [P.O.:960] Out: 3525 [Urine:3525]  Filed Weights   08/18/13 0539 08/19/13 0745 08/20/13 0611  Weight: 58.5 kg (128 lb 15.5 oz) 57.244 kg (126 lb 3.2 oz) 55.3 kg (121 lb 14.6 oz)    Current meds: reviewed includes lasix 160 QID, PhosLo 1qAC, Renveal 1qAC, Calcitriol 0.25 daily, NaHCO3 1300 BID Current Labs: reviewed    Physical Exam:  Blood pressure 138/71, pulse 81, temperature 96.6 F (35.9 C), temperature source Oral, resp. rate 18, height 4\' 11"  (1.499 m), weight 55.3 kg (121 lb 14.6 oz), SpO2 100.00%. Elderly chronically ill female in bed Nl WOB, CTAB ant auscultation RRR. Nl s1s2 S/nt nabs Nonfocal  Assessment 1. CKD4 2. Volume overload, improving and responsive to diuresis 3. A/C Diastolic HF 4. Hyperphosphatemia on PhosLo/Renvela 5. HPTH on VDRA 6. Anemia TSAT 8% s/p 510mg  ferahema 08/19/13 7. Metabolic Acidosis on NaHCO3 1300 BID, improved  Plan 1. No change to diuretics today 2. Cont binders, VDRA 3. Cont NaHCO3  Sabra Heck MD 08/20/2013, 11:39 AM   Recent Labs Lab 08/18/13 0440 08/19/13 0451 08/20/13 0532  NA 134* 133* 140  K 5.1 5.1 4.6  CL 97 96 98  CO2 18* 19 26  GLUCOSE 131* 119* 40*  BUN 98* 96* 90*  CREATININE 3.67* 3.45* 3.19*  CALCIUM 8.7 9.0 9.9  PHOS  --  5.1*  --     Recent Labs Lab 08/15/13 1424 08/19/13 0451  WBC 10.5 6.8  NEUTROABS 8.1*  --   HGB 8.6* 8.4*  HCT 27.5* 25.9*  MCV 85.7 83.0  PLT 357 PLATELET CLUMPS NOTED ON SMEAR, COUNT APPEARS ADEQUATE

## 2013-08-20 NOTE — Progress Notes (Signed)
Inpatient Diabetes Program Recommendations  AACE/ADA: New Consensus Statement on Inpatient Glycemic Control (2013)  Target Ranges:  Prepandial:   less than 140 mg/dL      Peak postprandial:   less than 180 mg/dL (1-2 hours)      Critically ill patients:  140 - 180 mg/dL  Results for [name], [name] (MRN [id number]) as of 08/20/2013 09:52  Ref. Range 08/19/2013 11:23 08/19/2013 16:19 08/19/2013 21:05 08/20/2013 06:23 08/20/2013 06:50  Glucose-Capillary Latest Range: 70-99 mg/dL 220 (H) 92 88 37 (LL) 254 (H)    Inpatient Diabetes Program Recommendations Insulin - Basal: consider decreasing Lantus to 15 units   Note: Severe HYPOglycemia this morning.   Thank you  Piedad Climes BSN, RN,CDE Inpatient Diabetes Coordinator [phone] (team pager)

## 2013-08-21 DIAGNOSIS — E440 Moderate protein-calorie malnutrition: Secondary | ICD-10-CM | POA: Insufficient documentation

## 2013-08-21 DIAGNOSIS — R5381 Other malaise: Secondary | ICD-10-CM

## 2013-08-21 DIAGNOSIS — M109 Gout, unspecified: Secondary | ICD-10-CM

## 2013-08-21 LAB — GLUCOSE, CAPILLARY
GLUCOSE-CAPILLARY: 69 mg/dL — AB (ref 70–99)
GLUCOSE-CAPILLARY: 73 mg/dL (ref 70–99)
Glucose-Capillary: 92 mg/dL (ref 70–99)
Glucose-Capillary: 180 mg/dL — ABNORMAL HIGH (ref 70–99)

## 2013-08-21 LAB — BASIC METABOLIC PANEL
BUN: 82 mg/dL — ABNORMAL HIGH (ref 6–23)
CHLORIDE: 94 meq/L — AB (ref 96–112)
CO2: 29 meq/L (ref 19–32)
Calcium: 9.6 mg/dL (ref 8.4–10.5)
Creatinine, Ser: 2.81 mg/dL — ABNORMAL HIGH (ref 0.50–1.10)
GFR calc non Af Amer: 14 mL/min — ABNORMAL LOW (ref >90)
GFR, EST AFRICAN AMERICAN: 16 mL/min — AB (ref >90)
Glucose, Bld: 98 mg/dL (ref 70–99)
POTASSIUM: 4.2 meq/L (ref 3.7–5.3)
SODIUM: 137 meq/L (ref 137–147)

## 2013-08-21 MED ORDER — GLUCERNA SHAKE PO LIQD: 237.0000 mL | Freq: Every day | ORAL | Status: DC | PRN

## 2013-08-21 MED ORDER — DOCUSATE SODIUM 100 MG PO CAPS
100.0000 mg | ORAL_CAPSULE | Freq: Two times a day (BID) | ORAL | Status: DC
  Administered 2013-08-22 – 2013-08-23 (×3): 100 mg via ORAL
  Filled 2013-08-21 (×5): qty 1

## 2013-08-21 MED ORDER — PREDNISONE 20 MG PO TABS
20.0000 mg | ORAL_TABLET | Freq: Every day | ORAL | Status: DC
  Administered 2013-08-22 – 2013-08-23 (×2): 20 mg via ORAL
  Filled 2013-08-21 (×3): qty 1

## 2013-08-21 MED ORDER — INSULIN GLARGINE 100 UNIT/ML ~~LOC~~ SOLN
20.0000 [IU] | Freq: Every day | SUBCUTANEOUS | Status: DC
  Administered 2013-08-22: 20 [IU] via SUBCUTANEOUS
  Filled 2013-08-21 (×2): qty 0.2

## 2013-08-21 MED ORDER — POLYETHYLENE GLYCOL 3350 17 G PO PACK
17.0000 g | PACK | Freq: Two times a day (BID) | ORAL | Status: DC
  Administered 2013-08-22 – 2013-08-23 (×3): 17 g via ORAL
  Filled 2013-08-21 (×5): qty 1

## 2013-08-21 NOTE — Progress Notes (Signed)
INITIAL NUTRITION ASSESSMENT  DOCUMENTATION CODES Per approved criteria  -Non-severe (moderate) malnutrition in the context of chronic illness  Pt meets criteria for MODERATE MALNUTRITION in the context of CHRONIC ILLNESS as evidenced by 8% weight loss in less than 3 months and moderate muscle wasting per physical exam.  INTERVENTION: Provide snack once daily Add Glucerna Shake PRN once daily Recommend scheduled stool softener/laxative (Last BM was 5/28) RD to continue to monitor and provide further intervention as needed  NUTRITION DIAGNOSIS: Unintentional weight loss related to chronic illnesses as evidenced by 8% weight loss in less than 3 months.   Goal: Pt to meet >/= 90% of their estimated nutrition needs   Monitor:  PO intake, weight trends, labs, I/O's  Reason for Assessment: Consult for Assessment  80 y.o. female  Admitting Dx: Acute respiratory failure with hypoxia  ASSESSMENT: 80 year old female with past medical history significant for diastolic CHF, last 2 D ECHO in 2014 showed EF 65%, left BKA, hypothyroidism, CKD stage IV, dyslipidemia who [facility] with worsening shortness of breath started about few days prior to this admission and getting worse in past 24 hours associated with nonproductive cough.  Pt very lethargic at time of visit and difficult to assess. She reports that she usually weighs 128 lbs. Per weight history pt has lost 8% of her body weight within the past 3 months. She states her appetite has been good and she has been eating well; she usually eats 2 meals per day. She reports eating 50% of her breakfast this morning. Reviewed protein-rich foods which pt states she does not get daily.   Nutrition Focused Physical Exam:  Subcutaneous Fat:  Orbital Region: wnl Upper Arm Region: wnl Thoracic and Lumbar Region: NA  Muscle:  Temple Region: moderate wasting Clavicle Bone Region: wnl Clavicle and Acromion Bone Region:  wnl Scapular Bone Region: NA Dorsal Hand: wnl Patellar Region: mild wasting Anterior Thigh Region: mild wasting Posterior Calf Region: mild wasting  Edema: none noted on exam; +2 RUE edema, +1 LUE, +1 RLE, +1 LLE per nursing notes 6/1   Height: Ht Readings from Last 1 Encounters:  08/15/13 4\' 11"  (1.499 m)    Weight: Wt Readings from Last 1 Encounters:  08/21/13 118 lb 9.7 oz (53.8 kg)    Ideal Body Weight: 93 lbs (adjusted for BKA)  % Ideal Body Weight: 127%  Wt Readings from Last 10 Encounters:  08/21/13 118 lb 9.7 oz (53.8 kg)  08/02/13 122 lb (55.339 kg)  06/17/13 122 lb 9.6 oz (55.611 kg)  06/14/13 128 lb (58.06 kg)  06/06/13 128 lb (58.06 kg)  03/11/13 132 lb (59.875 kg)  02/27/13 128 lb (58.06 kg)  02/20/13 130 lb (58.968 kg)  01/30/13 128 lb (58.06 kg)  01/16/13 127 lb 6.8 oz (57.8 kg)    Usual Body Weight: 128 lbs  % Usual Body Weight: 92%  BMI:  Body mass index is 25.2 kg/(m^2). (adjusted for amputations)  Estimated Nutritional Needs: Kcal: 1300-1500 Protein: 50-60 grams Fluid: 1.3-1.5 L/day  Skin: intact; +2 RUE edema, +1 LUE, +1 RLE, +1 LLE    Diet Order: Renal  EDUCATION NEEDS: -No education needs identified at this time   Intake/Output Summary (Last 24 hours) at 08/21/13 1026 Last data filed at 08/21/13 0500  Gross per 24 hour  Intake    600 ml  Output   1950 ml  Net  -1350 ml    Last BM: 5/28  Labs:   Recent Labs  Lab 08/15/13 2015  08/19/13 0451 08/20/13 0532 08/21/13 0422  NA 137  < > 133* 140 137  K 5.7*  < > 5.1 4.6 4.2  CL 100  < > 96 98 94*  CO2 18*  < > 19 26 29   BUN 79*  < > 96* 90* 82*  CREATININE 3.04*  < > 3.45* 3.19* 2.81*  CALCIUM 8.7  < > 9.0 9.9 9.6  MG 2.6*  --   --   --   --   PHOS  --   --  5.1*  --   --   GLUCOSE 128*  < > 119* 40* 98  < > = values in this interval not displayed.  CBG (last 3)   Recent Labs  08/20/13 1654 08/20/13 2125 08/21/13 0547  GLUCAP 159* 140* 69*    Scheduled  Meds: . allopurinol  100 mg Oral Daily  . aspirin EC  81 mg Oral Daily  . calcitRIOL  0.25 mcg Oral Daily  . calcium acetate  667 mg Oral TID WC  . carvedilol  3.125 mg Oral BID WC  . clopidogrel  75 mg Oral Q breakfast  . furosemide  160 mg Intravenous Q6H  . gabapentin  300 mg Oral TID  . insulin aspart  0-15 Units Subcutaneous TID WC  . insulin aspart  0-5 Units Subcutaneous QHS  . insulin glargine  36 Units Subcutaneous QHS  . ipratropium-albuterol  3 mL Nebulization TID  . isosorbide mononitrate  60 mg Oral Daily  . latanoprost  1 drop Both Eyes QHS  . levothyroxine  75 mcg Oral QAC breakfast  . linagliptin  5 mg Oral Daily  . multivitamin  1 tablet Oral QHS  . nortriptyline  50 mg Oral QHS  . sevelamer carbonate  800 mg Oral TID WC  . simvastatin  20 mg Oral q1800  . sodium bicarbonate  1,300 mg Oral BID  . sodium chloride  3 mL Intravenous Q12H    Continuous Infusions:   Past Medical History  Diagnosis Date  . Coronary artery disease     a. Cath 09/2010 - med rx.  Marland Kitchen TIA (transient ischemic attack)   . Hypertension   . Shingles   . Stroke   . Anemia   . Cardiomyopathy, idiopathic 02/08/2011  . Myocardial infarct     x 3 unsure of years  . Irregular heartbeat   . Ulcer   . GERD (gastroesophageal reflux disease)   . Hx of transient ischemic attack (TIA)   . Memory loss   . Chronic diastolic CHF (congestive heart failure)     Takes Lasix  . Gout     takes allopurinol  . Peripheral vascular disease     a. s/p L BKA.  . Diabetes mellitus     type 2 NIDDM  . Chronic kidney disease (CKD), stage IV (severe)   . Arthritis   . Hypothyroidism     (SEVERE) Takes Levothryroxine  . Nonischemic cardiomyopathy     EF now is 55%, reduced due to myxedema, which is improved.  . Dyslipidemia   . Peripheral neuropathy   . H/O echocardiogram 09/06/11    Indication- nonIschemic Cardiomyopathy. EF = now greater than 55% with no regional wall motion abnormalities. Tthere is  mild to moderate trisuspid regurgitayion and mild pulmonary hypertension with an RVSP of 35 mmHg as well as stage 1 diastolic dysfunction and mild to moderate LVH.  Marland Kitchen Abnormal nuclear stress test 06/01/09  Demonstrated a new area of infarct scar, peri-infarct ischemia seen in the inferolateral territory. EF eas normal at 70% with mild hypocontractility at the apex, distal inferolateral wall.    Past Surgical History  Procedure Laterality Date  . Back surgery      [facility]  . Eye surgery      Left eye surgery; cataract removal  . Left bka  07/05/2011  . Av fistula placement    . Amputation  07/05/2011    Procedure: AMPUTATION BELOW KNEE;  Surgeon: Chuck Hint, MD;  Location: [facility] OR;  Service: Vascular;  Laterality: Left;  . Angioplasty  1988  . Cardiac catheterization  09/28/07    Demonstrated multiple sequential lesions around 40 to 30% in the RCA territory.  . Left lower extremity venous duplex Left 06/27/11    Summary: No evidence of DVT involving the left lower extremity and right common femoral vein.   . Lower extremity arterial evaluation  06/27/11    SUMMARY: Right: ABI not ascertained due to false elevation in BP secondary to calcification (posterior tibial artery is non compressible). Left: ABI indicates moderate reduction in arterial flow. Bilateral: Great toe PPG waveforms indicate adequate perfusion. Great toe pressures not obtained due to patient's movements secondary to pain.  . Duplex doppler  05/10/11    LE arterial dopplers demonstrate bilaterally reduced ABIs of 0.91 on right & 0.56 on left. She does report some decreased pain on the left, & there's moderate mixed-density plaque in the right SFA w/50 to 69% reduction. There's a 69% reduction in the left SFA & does appear to be occlusive disease of left posterior tibial artery. Right posterior dorsalis pedis artery demonstrates occlusive disease  . Insertion of dialysis catheter N/A 01/06/2013    Procedure:  INSERTION OF DIALYSIS CATHETER;  Surgeon: Larina Earthly, MD;  Location: [facility] OR;  Service: Vascular;  Laterality: N/A;    Ian Malkin RD, LDN Inpatient Clinical Dietitian Pager: [phone] After Hours Pager: [phone]

## 2013-08-21 NOTE — Progress Notes (Signed)
Rehab admissions - Evaluated for possible admission.  Please see rehab consult recommending SNF placement.  Call me for questions.  #[phone]

## 2013-08-21 NOTE — Progress Notes (Signed)
[facility] PROGRESS NOTE  [name] Husband [id number] DOB: 10/16/1923 DOA: 08/15/2013 PCP: Georgianne FickAMACHANDRAN,AJITH, MD  Assessment/Plan: Acute respiratory failure with hypoxia  Likely secondary to acute on chronic diastolic CHF exacerbation  Daily weight and strict intake and output  Continue her home meds: coreg, imdur, hydralazine, Norvasc, aspirin and plavix  Increased resp effort with decreased BS and end-expiratory wheezing on 6/1 with repeat CXR demonstrating worsened pulm edema Nephrology assisting with diuresis. Currently on 160mg  IV lasix q6hrs  Currently remains on 2-3LNC.   Acute on chronic diastolic CHF (congestive heart failure)  Continue coreg  Lasix IV daily at 160mg  Cont with daily weight and strict intake and output  Replete electrolytes as need  2 D ECHO with EF 50-55% Thus far net neg 3.4L since admit Appreciate input from Cardiology and Nephrology Patient with good diuresis now  CAD - moderate at cath July 2012 (medical Rx)  Continue coreg, aspirin and plavix Cardiology on board, will follow rec's  Hypothyroidism  Continue levothyroxine  Peripheral vascular disease  Status post left BKA  Continue aspirin and plavix  Dyslipidemia  Continue statin therapy   Chronic renal insufficiency, stage IV (severe)  Continue calcitrol, phoslo and renvela Cr improving with diuresis Appreciate Nephrology assistance Aggressive diuresis to be continue for now  Anemia of chronic disease  Secondary to CKD  Hemoglobin stable and no signs of overt bleeding No current indications for transfusion   Constipation  Will start daily colace and miralax  Dysphagia  Will request SPT evaluation.  Right foot pain  Mild swelling appreciated; patient reports hx of gout in the past. Most likely flare due to aggressive diuresis  Will give trial with prednisone  Moderate protein calorie malnutrition  Continue feeding supplements as recommended by  dietician  Hyperkalemia   Stable and WNL currently Cont to follow electrolytes   Code Status: Full Family Communication: no family at bedside Disposition Plan: Pending   Consultants:  Cardiology  Nephrology  Procedures:  See below for x-ray reports  Antibiotics:  None   HPI/Subjective: Feeling and breathing better. No CP or fever. Complaining of constipation and right foot pain. Per nursing report patient with some episodes of choking and difficulty swallowing.  Objective: Filed Vitals:   08/21/13 1307 08/21/13 1826 08/21/13 2049 08/21/13 2137  BP: 133/58   133/68  Pulse: 66   76  Temp: 97.1 F (36.2 C)   97.8 F (36.6 C)  TempSrc: Oral   Oral  Resp: 20   18  Height:      Weight:      SpO2: 95% 95% 95% 100%    Intake/Output Summary (Last 24 hours) at 08/21/13 2147 Last data filed at 08/21/13 2140  Gross per 24 hour  Intake    603 ml  Output   1350 ml  Net   -747 ml   Filed Weights   08/19/13 0745 08/20/13 0611 08/21/13 0501  Weight: 57.244 kg (126 lb 3.2 oz) 55.3 kg (121 lb 14.6 oz) 53.8 kg (118 lb 9.7 oz)    Exam:   General:  Afebrile, in NAD; breathing better  Cardiovascular: regular, s1, s2; no rubs or gallops  Respiratory: improved air movement; bibasilar crackles appreciated.   Abdomen: soft, nondistended, positive BS  Musculoskeletal: perfused, no clubbing   Data Reviewed: Basic Metabolic Panel:  Recent Labs Lab 08/15/13 2015  08/17/13 0400 08/18/13 0440 08/19/13 0451 08/20/13 0532 08/21/13 0422  NA 137  < > 139 134* 133* 140 137  K 5.7*  < >  4.5 5.1 5.1 4.6 4.2  CL 100  < > 102 97 96 98 94*  CO2 18*  < > 22 18* 19 26 29   GLUCOSE 128*  < > 100* 131* 119* 40* 98  BUN 79*  < > 89* 98* 96* 90* 82*  CREATININE 3.04*  < > 3.28* 3.67* 3.45* 3.19* 2.81*  CALCIUM 8.7  < > 9.0 8.7 9.0 9.9 9.6  MG 2.6*  --   --   --   --   --   --   PHOS  --   --   --   --  5.1*  --   --   < > = values in this interval not displayed. Liver  Function Tests:  Recent Labs Lab 08/15/13 1424 08/15/13 2015 08/19/13 0451  AST 23 29 19   ALT 20 20 14   ALKPHOS 83 86 76  BILITOT 0.5 0.6 0.3  PROT 7.4 7.1 6.3  ALBUMIN 3.1* 3.0* 2.5*   CBC:  Recent Labs Lab 08/15/13 1424 08/19/13 0451  WBC 10.5 6.8  NEUTROABS 8.1*  --   HGB 8.6* 8.4*  HCT 27.5* 25.9*  MCV 85.7 83.0  PLT 357 PLATELET CLUMPS NOTED ON SMEAR, COUNT APPEARS ADEQUATE   BNP (last 3 results)  Recent Labs  06/10/13 1645 08/15/13 1424 08/15/13 2015  PROBNP 1297.00* 30760.0* 31926.0*   CBG:  Recent Labs Lab 08/20/13 1654 08/20/13 2125 08/21/13 0547 08/21/13 1223 08/21/13 1635  GLUCAP 159* 140* 69* 73 92     Studies: No results found.  Scheduled Meds: . allopurinol  100 mg Oral Daily  . aspirin EC  81 mg Oral Daily  . calcitRIOL  0.25 mcg Oral Daily  . calcium acetate  667 mg Oral TID WC  . carvedilol  3.125 mg Oral BID WC  . clopidogrel  75 mg Oral Q breakfast  . docusate sodium  100 mg Oral BID  . furosemide  160 mg Intravenous Q6H  . gabapentin  300 mg Oral TID  . insulin aspart  0-15 Units Subcutaneous TID WC  . insulin aspart  0-5 Units Subcutaneous QHS  . insulin glargine  36 Units Subcutaneous QHS  . ipratropium-albuterol  3 mL Nebulization TID  . isosorbide mononitrate  60 mg Oral Daily  . latanoprost  1 drop Both Eyes QHS  . levothyroxine  75 mcg Oral QAC breakfast  . linagliptin  5 mg Oral Daily  . multivitamin  1 tablet Oral QHS  . nortriptyline  50 mg Oral QHS  . polyethylene glycol  17 [name] Oral BID  . sevelamer carbonate  800 mg Oral TID WC  . simvastatin  20 mg Oral q1800  . sodium bicarbonate  1,300 mg Oral BID  . 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 If 7PM-7AM, please contact night-coverage at [email or web address], password [facility] 08/21/2013, 9:47 PM  LOS: 6 days

## 2013-08-21 NOTE — ED Provider Notes (Signed)
I saw and evaluated the patient, reviewed the resident's note and I agree with the findings and plan.   EKG Interpretation   Date/Time:  Thursday Aug 15 2013 14:26:49 EDT Ventricular Rate:  70 PR Interval:    QRS Duration: 126 QT Interval:  423 QTC Calculation: 456 R Axis:   22 Text Interpretation:  Accelerated junctional rhythm IVCD, consider  atypical RBBB ED PHYSICIAN INTERPRETATION AVAILABLE IN [facility]  Confirmed by TEST, Record ([id number]) on 08/17/2013 10:48:08 AM      89yf with dyspnea. Endorses orthopnea. Crackles on exam. CXR with effusions. Clinically suspect CHF exacerbation. Lasix. Admit.   Raeford Razor, MD 08/21/13 [phone]

## 2013-08-21 NOTE — Clinical Social Work Placement (Addendum)
    Clinical Social Work Department CLINICAL SOCIAL WORK PLACEMENT NOTE 08/23/2013  Patient:  [name], [name]  Account Number:  [id number] Admit date:  08/15/2013  Clinical Social Worker:  Lupita Leash Floria Brandau, LCSWA  Date/time:  08/20/2013 04:55 PM  Clinical Social Work is seeking post-discharge placement for this patient at the following level of care:   SKILLED NURSING   (*CSW will update this form in Epic as items are completed)   08/20/2013  Patient/family provided with [facility] Department of Clinical Social Work's list of facilities offering this level of care within the geographic area requested by the patient (or if unable, by the patient's family).  08/20/2013  Patient/family informed of their freedom to choose among providers that offer the needed level of care, that participate in Medicare, Medicaid or managed care program needed by the patient, have an available bed and are willing to accept the patient.  08/20/2013  Patient/family informed of [facility]' ownership interest in [facility], as well as of the fact that they are under no obligation to receive care at this facility.  PASARR submitted to EDS on  PASARR number received on   FL2 transmitted to all facilities in geographic area requested by pt/family on  08/21/2013 FL2 transmitted to all facilities within larger geographic area on   Patient informed that his/her managed care company has contracts with or will negotiate with  certain facilities, including the following:   Has Existing [facility]  [facility] is currently evaluating patient for possible admission  [facility]     Patient/family informed of bed offers received:  08/22/2013 Patient chooses bed [facility] Physician recommends and patient chooses bed at    Patient to be transferred to [facility] on  08/23/2013 Patient to be transferred to facility by Ambulance  [facility]) Patient and family notified  of transfer on 08/23/2013 Name of family member notified:  Daughter: [name]  The following physician request were entered in Epic: Physician Request  Please sign FL2.  Please prepare priority discharge summary and prescriptions.    Additional Comments: 08/23/13  Ok per MD for d/c today for SNF- short term rehab. CSW spoke with patient and her daughter [name]. Daughter stated that her brother wanted to know why patient could not return home. CSW spoke again to patient and daughter about right to self-determination and offered to assist patient with return home if that was her choice.  Daughter stated that she felt that short term rehab would provide best benefit to patient and patient agreed. She stated that she wil work hard to be able to return home as soon as possible. Patient was much more alert and oriented today; she stated that she understood the benefits of SNF and was excited to start her rehab.  Patient and daughter denied any further questions or concerns. Daughter will notify her brother of above.  Nursing to call report. No futher CSW needs identified. CSW signing off.

## 2013-08-21 NOTE — Progress Notes (Signed)
Physical Therapy Treatment Patient Details Name: [name] MRN: [id number] DOB: 04/08/1923 Today's Date: 08/21/2013    History of Present Illness 80 year old female with past medical history significant for diastolic CHF, last 2 D ECHO in 2014 showed EF 65%, left BKA, hypothyroidism, CKD stage IV, dyslipidemia who presented to [facility] ED 08/15/2013 with worsening shortness of breath started about few days prior to this admission and getting worse in past 24 hours associated with nonproductive cough. Pt reported feeling short of breath with rest and with exertion    PT Comments    Pt not showing functional progress towards physical therapy goals at this time. During today's session, pt was very lethargic and was demonstrating the need for increased assist during basic transfers. Noted from chart review the plan is for SNF at d/c for continued skilled therapy services. PT agrees with this change, and frequency was updated to reflect this. Will continue to follow and progress per POC.   Follow Up Recommendations  SNF;Supervision/Assistance - 24 hour     Equipment Recommendations  None recommended by PT    Recommendations for Other Services       Precautions / Restrictions Precautions Precautions: Fall Precaution Comments: weak with poor trunk stability Restrictions Weight Bearing Restrictions: No Other Position/Activity Restrictions: RUE with significant edema, prop on pillows    Mobility  Bed Mobility Overal bed mobility: Needs Assistance;+2 for physical assistance Bed Mobility: Supine to Sit     Supine to sit: Total assist;+2 for physical assistance;HOB elevated     General bed mobility comments: Pt showing little active movement during transition to sitting. Max trunk support required and bed pad was used to scoot hips to EOB.   Transfers Overall transfer level: Needs assistance Equipment used: 2 person hand held assist Transfers: Lateral/Scoot Transfers          Lateral/Scoot Transfers: Total assist;+2 physical assistance General transfer comment: Pt unable to maintain erect posture and was leaning again OT for trunk support. Bed pad used to assist with scooting pt from bed to drop-arm recliner. Pt with little to no active movement or support of UE's during this time.   Ambulation/Gait                 Stairs            Wheelchair Mobility    Modified Rankin (Stroke Patients Only)       Balance Overall balance assessment: Needs assistance Sitting-balance support: Feet supported;No upper extremity supported Sitting balance-Leahy Scale: Zero   Postural control: Posterior lean;Right lateral lean;Left lateral lean                          Cognition Arousal/Alertness: Lethargic Behavior During Therapy: Flat affect Overall Cognitive Status: Difficult to assess                      Exercises      General Comments        Pertinent Vitals/Pain Pt's vitals stable throughout session.     Home Living                      Prior Function            PT Goals (current goals can now be found in the care plan section) Acute Rehab PT Goals Patient Stated Goal: not stated PT Goal Formulation: With patient Potential to Achieve Goals: Good Progress towards PT  goals: Not progressing toward goals - comment    Frequency  Min 2X/week    PT Plan Discharge plan needs to be updated;Frequency needs to be updated    Co-evaluation             End of Session Equipment Utilized During Treatment: Oxygen Activity Tolerance: Patient limited by lethargy Patient left: in chair;with call bell/phone within reach;with chair alarm set     Time: [id number] PT Time Calculation (min): 17 min  Charges:  $Therapeutic Activity: 8-22 mins                    G Codes:      Ruthann CancerLaura Hamilton 08/21/2013, 5:00 PM  Ruthann CancerLaura Hamilton, PT, DPT Acute Rehabilitation Services Pager: [phone]

## 2013-08-21 NOTE — Consult Note (Signed)
Physical Medicine and Rehabilitation Consult Reason for Consult: Deconditioning/respiratory failure/congestive heart failure Referring Physician: [facility]   HPI: [name] is a 80 y.o. right-handed female with history of diastolic congestive heart failure, chronic renal insufficiency with baseline creatinine 3.04, left BKA April of 2013 and was discharged to a skilled nursing facility at that time. She has a history of medical and dietary noncompliance. Patient patient is wheelchair-bound and lives with family. She does not have a prosthesis for left BKA. Presented 08/15/2013 with increasing shortness of breath. Chest x-ray showed left basilar atelectasis with pulmonary edema. Maintained on intravenous diuresis for her acute on chronic diastolic heart failure. Creatinine monitored closely while on diuresis with latest creatinine 3.67 in followup per renal services. Echocardiogram with ejection fraction of 55% no wall motion abnormalities. Physical and occupational therapy evaluations completed with recommendations for physical medicine rehabilitation consult.  Pt asleep but arouses to voice Denies pains, states no BM since admit Review of Systems  Respiratory: Positive for shortness of breath.   Cardiovascular: Positive for leg swelling.  Gastrointestinal:       GERD  Musculoskeletal: Positive for myalgias.  Psychiatric/Behavioral: Positive for memory loss.  All other systems reviewed and are negative.  Past Medical History  Diagnosis Date  . Coronary artery disease     a. Cath 09/2010 - med rx.  Marland Kitchen TIA (transient ischemic attack)   . Hypertension   . Shingles   . Stroke   . Anemia   . Cardiomyopathy, idiopathic 02/08/2011  . Myocardial infarct     x 3 unsure of years  . Irregular heartbeat   . Ulcer   . GERD (gastroesophageal reflux disease)   . Hx of transient ischemic attack (TIA)   . Memory loss   . Chronic diastolic CHF (congestive heart failure)     Takes Lasix    . Gout     takes allopurinol  . Peripheral vascular disease     a. s/p L BKA.  . Diabetes mellitus     type 2 NIDDM  . Chronic kidney disease (CKD), stage IV (severe)   . Arthritis   . Hypothyroidism     (SEVERE) Takes Levothryroxine  . Nonischemic cardiomyopathy     EF now is 55%, reduced due to myxedema, which is improved.  . Dyslipidemia   . Peripheral neuropathy   . H/O echocardiogram 09/06/11    Indication- nonIschemic Cardiomyopathy. EF = now greater than 55% with no regional wall motion abnormalities. Tthere is mild to moderate trisuspid regurgitayion and mild pulmonary hypertension with an RVSP of 35 mmHg as well as stage 1 diastolic dysfunction and mild to moderate LVH.  Marland Kitchen Abnormal nuclear stress test 06/01/09    Demonstrated a new area of infarct scar, peri-infarct ischemia seen in the inferolateral territory. EF eas normal at 70% with mild hypocontractility at the apex, distal inferolateral wall.   Past Surgical History  Procedure Laterality Date  . Back surgery      [facility]  . Eye surgery      Left eye surgery; cataract removal  . Left bka  07/05/2011  . Av fistula placement    . Amputation  07/05/2011    Procedure: AMPUTATION BELOW KNEE;  Surgeon: Chuck Hint, MD;  Location: [facility] OR;  Service: Vascular;  Laterality: Left;  . Angioplasty  1988  . Cardiac catheterization  09/28/07    Demonstrated multiple sequential lesions around 40 to 30% in the RCA territory.  Marland Kitchen  Left lower extremity venous duplex Left 06/27/11    Summary: No evidence of DVT involving the left lower extremity and right common femoral vein.   . Lower extremity arterial evaluation  06/27/11    SUMMARY: Right: ABI not ascertained due to false elevation in BP secondary to calcification (posterior tibial artery is non compressible). Left: ABI indicates moderate reduction in arterial flow. Bilateral: Great toe PPG waveforms indicate adequate perfusion. Great toe pressures not obtained due to  patient's movements secondary to pain.  . Duplex doppler  05/10/11    LE arterial dopplers demonstrate bilaterally reduced ABIs of 0.91 on right & 0.56 on left. She does report some decreased pain on the left, & there's moderate mixed-density plaque in the right SFA w/50 to 69% reduction. There's a 69% reduction in the left SFA & does appear to be occlusive disease of left posterior tibial artery. Right posterior dorsalis pedis artery demonstrates occlusive disease  . Insertion of dialysis catheter N/A 01/06/2013    Procedure: INSERTION OF DIALYSIS CATHETER;  Surgeon: Larina Earthlyodd F Early, MD;  [facility];  Service: Vascular;  Laterality: N/A;   Family History  Problem Relation Age of Onset  . Cancer Sister     STOMACH  . Diabetes Sister   . Cancer Brother     BONE  . Diabetes Brother   . Anesthesia problems Neg Hx   . Hypotension Neg Hx   . Malignant hyperthermia Neg Hx   . Pseudochol deficiency Neg Hx   . Hyperlipidemia Daughter   . Hypertension Daughter   . Heart disease Daughter     before age 78  . Kidney disease Daughter   . Other Daughter     varicose veins  . Diabetes Daughter   . Heart disease Son     before age 78  . Hyperlipidemia Son   . Hypertension Son   . Heart attack Son   . Heart attack Daughter   . Heart disease Daughter     Before age 78   Social History:  reports that she quit smoking about 27 years ago. She has quit using smokeless tobacco. She reports that she does not drink alcohol or use illicit drugs. Allergies:  Allergies  Allergen Reactions  . Aspirin Nausea And Vomiting    325 mg (adult strength) [name]    Medications Prior to Admission  Medication Sig Dispense Refill  . acetaminophen (TYLENOL) 325 MG tablet Take 650 mg by mouth every 6 (six) hours as needed for mild pain.      Marland Kitchen. allopurinol (ZYLOPRIM) 100 MG tablet Take 100 mg by mouth daily.       Marland Kitchen. amLODipine (NORVASC) 10 MG tablet  Take 5 mg by mouth daily.       Marland Kitchen. aspirin EC 81 MG tablet Take 81 mg by mouth daily.      . calcitRIOL (ROCALTROL) 0.25 MCG capsule Take 0.25 mcg by mouth daily.       . calcium acetate (PHOSLO) 667 MG capsule Take 1 capsule (667 mg total) by mouth 3 (three) times daily with meals.      . carvedilol (COREG) 6.25 MG tablet Take 1 tablet (6.25 mg total) by mouth 2 (two) times daily with a meal.  60 tablet  1  . clopidogrel (PLAVIX) 75 MG tablet Take 75 mg by mouth daily.      . furosemide (LASIX) 80 MG tablet Take 1 tablet (80 mg  total) by mouth 2 (two) times daily.  60 tablet  6  . gabapentin (NEURONTIN) 300 MG capsule Take 300 mg by mouth 3 (three) times daily.      . hydrALAZINE (APRESOLINE) 25 MG tablet Take 1 tablet (25 mg total) by mouth every 8 (eight) hours.      Marland Kitchen HYDROcodone-acetaminophen (NORCO/VICODIN) 5-325 MG per tablet Take 1 tablet by mouth 2 (two) times daily as needed for moderate pain.       Marland Kitchen insulin glargine (LANTUS) 100 UNIT/ML injection Inject 36 Units into the skin daily.      . isosorbide mononitrate (IMDUR) 60 MG 24 hr tablet Take 60 mg by mouth daily.       Marland Kitchen levothyroxine (SYNTHROID, LEVOTHROID) 75 MCG tablet Take 75 mcg by mouth daily before breakfast.      . multivitamin (RENA-VIT) TABS tablet Take 1 tablet by mouth at bedtime.    0  . nitroGLYCERIN (NITROSTAT) 0.4 MG SL tablet Place 0.4 mg under the tongue every 5 (five) minutes as needed for chest pain.       . nortriptyline (PAMELOR) 50 MG capsule Take 50 mg by mouth daily.       . ONGLYZA 5 MG TABS tablet Take 5 mg by mouth daily.       . polyethylene glycol (MIRALAX / GLYCOLAX) packet Take 17 g by mouth daily as needed for mild constipation.      . pravastatin (PRAVACHOL) 40 MG tablet Take 40 mg by mouth daily.      Marland Kitchen RENVELA 800 MG tablet Take 800 mg by mouth 3 (three) times daily with meals.       . travoprost, benzalkonium, (TRAVATAN) 0.004 % ophthalmic solution Place 1 drop into both eyes at bedtime.          Home: Home Living Family/patient expects to be discharged to:: Private residence Living Arrangements: Children;Other relatives (sister in law) Available Help at Discharge: Family;Available PRN/intermittently Type of Home: House Home Access: Ramped entrance Home Layout: One level Home Equipment: Bedside commode;Shower seat;Walker - 2 wheels;Wheelchair - power  Functional History: Prior Function Level of Independence: Needs assistance Gait / Transfers Assistance Needed: pt states she's needed assist for bed>WC transfers ADL's / Homemaking Assistance Needed: family does all meals and housework, Min A for bathing and able to dress herself Comments: sister in law helps with home/meals, daughter drives her shopping/appts Functional Status:  Mobility: Bed Mobility Overal bed mobility: Needs Assistance Bed Mobility: Supine to Sit Supine to sit: Max assist;+2 for physical assistance General bed mobility comments: Assistance with hips/LE's and also trunk. Hand over hand assist to reach for bed rails for support, and cues for technique. Bed pad used for scooting pt to EOB. Transfers Overall transfer level: Needs assistance Equipment used: 2 person hand held assist Transfers: Lateral/Scoot Transfers  Lateral/Scoot Transfers: Total assist;+2 physical assistance General transfer comment: Pt unable to maintain erect posture and was leaning again OT for trunk support. Bed pad used to assist with scooting pt from bed to drop-arm recliner. Pt with little to no active movement or support of UE's during this time.  Ambulation/Gait General Gait Details: pt non-ambulatory, reports able to propel self in WC at baseline, even up/down ramp.  Would assess WC locomotion next visit as able to determine safety/ability for home d/c Wheelchair Mobility Wheelchair mobility:  (assess next visit if able)  ADL: ADL Overall ADL's : Needs assistance/impaired Eating/Feeding: Sitting;Moderate  assistance Grooming: Oral care;Wash/dry face;Sitting;Moderate assistance;Maximal assistance Upper Body Bathing:  Sitting;Maximal assistance Lower Body Bathing: Maximal assistance;Sitting/lateral leans Upper Body Dressing : Sitting;Maximal assistance Lower Body Dressing: Total assistance;Sitting/lateral leans Lower Body Dressing Details (indicate cue type and reason): Pt unable to even attempt to dress at chair level due to her poor postural control.  WIll attempt next session at bed level to start. Toilet Transfer: Total assistance Toilet Transfer Details (indicate cue type and reason): slide board to toilet and back.  Pt unable to push down and use RUE much due to pain in shoulder from rotator cuff. Toileting- Clothing Manipulation and Hygiene: Total assistance;Sitting/lateral lean Functional mobility during ADLs: Total assistance General ADL Comments: PTs swelling inRUE seems improved. Pt able to grip will with RUE at this time. Encouraged family to let pt do for herself as granndson feeding pt on arrival.  Cognition: Cognition Overall Cognitive Status: Difficult to assess Orientation Level: Other (comment) (sound asleep) Cognition Arousal/Alertness: Awake/alert Behavior During Therapy: Flat affect Overall Cognitive Status: Difficult to assess Area of Impairment: Attention;Awareness;Problem solving Current Attention Level: Sustained Awareness: Emergent Problem Solving: Slow processing General Comments: Pt has very little to say and seems to have her timeline of events off a bit.  Pt states she was I PTA from a w/c level but now is fully dependent.  Feel pt may have needed more assist PTA than what she recalls. Difficult to assess due to:  (Pt flat affect.  Does not talk much.)  Blood pressure 138/56, pulse 68, temperature 98 F (36.7 C), temperature source Oral, resp. rate 18, height 4\' 11"  (1.499 m), weight 53.8 kg (118 lb 9.7 oz), SpO2 99.00%. Physical Exam  Constitutional:  Frail  80 year old African American female.  HENT:  Head: Normocephalic.  Eyes: EOM are normal.  Neck: Normal range of motion. Neck supple. No thyromegaly present.  Cardiovascular:  Cardiac rate controlled  Respiratory:  Lungs decreased breath sounds clear to auscultation  GI: Soft. Bowel sounds are normal. She exhibits no distension.  Neurological:  Patient is lethargic but arousable. She will state her name and age. She is a very limited medical historian. She does follow simple commands.  Skin:  Left BKA site is well-healed  Motor 4/5 in Bilateral Delt Bi Tri Grip, 3- B HF, KE Trace R ankle DF/PF, pt co pain in foot Pain to palp R great toe and R heel,  Ext R foot warm no lesions, no swellin, foot intrinsic muscle Atrophy with reduced pulses  Results for orders placed during the hospital encounter of 08/15/13 (from the past 24 hour(s))  GLUCOSE, CAPILLARY     Status: Abnormal   Collection Time    08/20/13  6:23 AM      Result Value Ref Range   Glucose-Capillary 37 (*) 70 - 99 mg/dL   Comment 1 Notify RN    GLUCOSE, CAPILLARY     Status: Abnormal   Collection Time    08/20/13  6:50 AM      Result Value Ref Range   Glucose-Capillary 170 (*) 70 - 99 mg/dL  GLUCOSE, CAPILLARY     Status: Abnormal   Collection Time    08/20/13 11:17 AM      Result Value Ref Range   Glucose-Capillary 108 (*) 70 - 99 mg/dL  GLUCOSE, CAPILLARY     Status: Abnormal   Collection Time    08/20/13  4:54 PM      Result Value Ref Range   Glucose-Capillary 159 (*) 70 - 99 mg/dL  GLUCOSE, CAPILLARY     Status:  Abnormal   Collection Time    08/20/13  9:25 PM      Result Value Ref Range   Glucose-Capillary 140 (*) 70 - 99 mg/dL  BASIC METABOLIC PANEL     Status: Abnormal   Collection Time    08/21/13  4:22 AM      Result Value Ref Range   Sodium 137  137 - 147 mEq/L   Potassium 4.2  3.7 - 5.3 mEq/L   Chloride 94 (*) 96 - 112 mEq/L   CO2 29  19 - 32 mEq/L   Glucose, Bld 98  70 - 99 mg/dL   BUN 82 (*)  6 - 23 mg/dL   Creatinine, Ser 5.63 (*) 0.50 - 1.10 mg/dL   Calcium 9.6  8.4 - 14.9 mg/dL   GFR calc non Af Amer 14 (*) >90 mL/min   GFR calc Af Amer 16 (*) >90 mL/min  GLUCOSE, CAPILLARY     Status: Abnormal   Collection Time    08/21/13  5:47 AM      Result Value Ref Range   Glucose-Capillary 69 (*) 70 - 99 mg/dL   Dg Chest Port 1 View  08/19/2013   CLINICAL DATA:  Shortness of breath.  EXAM: PORTABLE CHEST - 1 VIEW  COMPARISON:  08/15/2013  FINDINGS: Cardiomegaly. Vascular congestion with increasing interstitial prominence and bibasilar opacities. Favor edema/CHF. Bibasilar opacities could also reflect atelectasis. Small bilateral pleural effusions. No acute bony abnormality.  IMPRESSION: Increasing vascular congestion and probable mild interstitial edema/ CHF.  Small bilateral effusions.  Bibasilar edema or atelectasis.   Electronically Signed   By: Charlett Nose M.D.   On: 08/19/2013 10:17    Assessment/Plan: Diagnosis: Deconditioning secondary to acute exacerbation of CHF 1. Does the need for close, 24 hr/day medical supervision in concert with the patient's rehab needs make it unreasonable for this patient to be served in a less intensive setting? Potentially 2. Co-Morbidities requiring supervision/potential complications: PAD, Diabetes and neuropathy, CKD 3. Due to bladder management, bowel management, safety, skin/wound care, disease management and pain management, does the patient require 24 hr/day rehab nursing? Potentially 4. Does the patient require coordinated care of a physician, rehab nurse, PT (1-2 hrs/day, 5 days/week) and OT (1-2 hrs/day, 5 days/week) to address physical and functional deficits in the context of the above medical diagnosis(es)? Potentially Addressing deficits in the following areas: balance, endurance, locomotion, strength, transferring, bowel/bladder control, bathing, dressing, feeding, toileting and cognition 5. Can the patient actively participate in an  intensive therapy program of at least 3 hrs of therapy per day at least 5 days per week? No 6. The potential for patient to make measurable gains while on inpatient rehab is poor 7. Anticipated functional outcomes upon discharge from inpatient rehab are n/a  with PT, n/a with OT, n/a with SLP. 8. Estimated rehab length of stay to reach the above functional goals is: NA 9. Does the patient have adequate social supports to accommodate these discharge functional goals? Potentially 10. Anticipated D/C setting: SNF 11. Anticipated post D/C treatments: HH therapy 12. Overall Rehab/Functional Prognosis: fair  RECOMMENDATIONS: This patient's condition is appropriate for continued rehabilitative care in the following setting: SNF Patient has agreed to participate in recommended program. Potentially Note that insurance prior authorization may be required for reimbursement for recommended care.  Comment: do not feel pt can tolerate rehab from either an endurance standpoint, or a pain standpoint.  C/o R foot pain with light palpation, ?neuropathy pain.    08/21/2013

## 2013-08-21 NOTE — Progress Notes (Signed)
Admit: 08/15/2013 LOS: 6  60F w/ AoC diastolic CHF exacerbation and CKD4 w/ volume overload  Subjective:  Cont with effective diuresis on 160 QID IV lasix SCr improving Pt w/o c/o. States she is "Fine"  06/02 0701 - 06/03 0700 In: 600 [P.O.:600] Out: 2800 [Urine:2800]  Filed Weights   08/19/13 0745 08/20/13 0611 08/21/13 0501  Weight: 57.244 kg (126 lb 3.2 oz) 55.3 kg (121 lb 14.6 oz) 53.8 kg (118 lb 9.7 oz)    Current meds: reviewed includes lasix 160 QID, PhosLo 1qAC, Renveal 1qAC, Calcitriol 0.25 daily, NaHCO3 1300 BID Current Labs: reviewed    Physical Exam:  Blood pressure 138/56, pulse 68, temperature 98 F (36.7 C), temperature source Oral, resp. rate 18, height 4\' 11"  (1.499 m), weight 53.8 kg (118 lb 9.7 oz), SpO2 95.00%. Elderly chronically ill female in bed Nl WOB, CTAB ant auscultation RRR. Nl s1s2 S/nt nabs Nonfocal  Assessment 1. CKD4, improving  2. Volume overload, improving and responsive to diuresis 3. A/C Diastolic HF 4. Hyperphosphatemia on PhosLo/Renvela 5. HPTH on VDRA 6. Anemia TSAT 8% s/p 510mg  ferahema 08/19/13 7. Metabolic Acidosis on NaHCO3 1300 BID, improved  Plan 1. No change to diuretics today, tentative plan of transition to PO regimen tomorrow (160 TID PO?) 2. Cont binders, VDRA 3. Cont NaHCO3, watch HCO3 for now  Sabra Heck MD 08/21/2013, 9:55 AM   Recent Labs Lab 08/19/13 0451 08/20/13 0532 08/21/13 0422  NA 133* 140 137  K 5.1 4.6 4.2  CL 96 98 94*  CO2 19 26 29   GLUCOSE 119* 40* 98  BUN 96* 90* 82*  CREATININE 3.45* 3.19* 2.81*  CALCIUM 9.0 9.9 9.6  PHOS 5.1*  --   --     Recent Labs Lab 08/15/13 1424 08/19/13 0451  WBC 10.5 6.8  NEUTROABS 8.1*  --   HGB 8.6* 8.4*  HCT 27.5* 25.9*  MCV 85.7 83.0  PLT 357 PLATELET CLUMPS NOTED ON SMEAR, COUNT APPEARS ADEQUATE

## 2013-08-21 NOTE — Progress Notes (Signed)
Hypoglycemic Event  CBG: 37  Treatment: D50 IV 25 mL  Symptoms: Sweaty and Hungry  Follow-up CBG: Time:0719 CBG Result:170  Possible Reasons for Event: Inadequate meal intake  Comments/MD notified:none    Tekela Garguilo Aguayo Wylee Ogden  Remember to initiate Hypoglycemia Order Set & complete

## 2013-08-21 NOTE — Progress Notes (Signed)
Patient: Shannon SirenRoberta G [name] / Admit Date: 08/15/2013 / Date of Encounter: 08/21/2013, 8:49 AM  Subjective  Denies acute complaints.   Objective   Telemetry: not on telemetry  Physical Exam: Blood pressure 138/56, pulse 68, temperature 98 F (36.7 C), temperature source Oral, resp. rate 18, height 4\' 11"  (1.499 m), weight 118 lb 9.7 oz (53.8 kg), SpO2 99.00%. General: Well developed elderly AAF in no acute distress.  Head: Normocephalic, atraumatic, sclera non-icteric, no xanthomas, nares are without discharge.  Neck: JVP not elevated.  Lungs: CTAB. No rales or rhonchi. Breathing is unlabored.  Heart: RRR S1 S2 without murmurs, rubs, or gallops.  Abdomen: Soft, non-tender, non-distended with normoactive bowel sounds. No rebound/guarding.  Extremities: No clubbing or cyanosis. No edema. S/p L BKA.  Neuro: Alert and oriented X 3. Follows commands.  Psych: Responds to questions appropriately with a pleasant but somewhat slowed affect.   Intake/Output Summary (Last 24 hours) at 08/21/13 0849 Last data filed at 08/21/13 0500  Gross per 24 hour  Intake    600 ml  Output   2800 ml  Net  -2200 ml    Inpatient Medications:  . allopurinol  100 mg Oral Daily  . aspirin EC  81 mg Oral Daily  . calcitRIOL  0.25 mcg Oral Daily  . calcium acetate  667 mg Oral TID WC  . carvedilol  3.125 mg Oral BID WC  . clopidogrel  75 mg Oral Q breakfast  . furosemide  160 mg Intravenous Q6H  . gabapentin  300 mg Oral TID  . insulin aspart  0-15 Units Subcutaneous TID WC  . insulin aspart  0-5 Units Subcutaneous QHS  . insulin glargine  36 Units Subcutaneous QHS  . ipratropium-albuterol  3 mL Nebulization TID  . isosorbide mononitrate  60 mg Oral Daily  . latanoprost  1 drop Both Eyes QHS  . levothyroxine  75 mcg Oral QAC breakfast  . linagliptin  5 mg Oral Daily  . multivitamin  1 tablet Oral QHS  . nortriptyline  50 mg Oral QHS  . sevelamer carbonate  800 mg Oral TID WC  . simvastatin  20 mg Oral  q1800  . sodium bicarbonate  1,300 mg Oral BID  . sodium chloride  3 mL Intravenous Q12H   Infusions:    Labs:  Recent Labs  08/19/13 0451 08/20/13 0532 08/21/13 0422  NA 133* 140 137  K 5.1 4.6 4.2  CL 96 98 94*  CO2 19 26 29   GLUCOSE 119* 40* 98  BUN 96* 90* 82*  CREATININE 3.45* 3.19* 2.81*  CALCIUM 9.0 9.9 9.6  PHOS 5.1*  --   --     Recent Labs  08/19/13 0451  AST 19  ALT 14  ALKPHOS 76  BILITOT 0.3  PROT 6.3  ALBUMIN 2.5*    Recent Labs  08/19/13 0451  WBC 6.8  HGB 8.4*  HCT 25.9*  MCV 83.0  PLT PLATELET CLUMPS NOTED ON SMEAR, COUNT APPEARS ADEQUATE   No results found for this basename: CKTOTAL, CKMB, TROPONINI,  in the last 72 hours No components found with this basename: POCBNP,  No results found for this basename: HGBA1C,  in the last 72 hours   Radiology/Studies:  Dg Chest Port 1 View  08/19/2013   CLINICAL DATA:  Shortness of breath.  EXAM: PORTABLE CHEST - 1 VIEW  COMPARISON:  08/15/2013  FINDINGS: Cardiomegaly. Vascular congestion with increasing interstitial prominence and bibasilar opacities. Favor edema/CHF. Bibasilar opacities could also reflect atelectasis.  Small bilateral pleural effusions. No acute bony abnormality.  IMPRESSION: Increasing vascular congestion and probable mild interstitial edema/ CHF.  Small bilateral effusions.  Bibasilar edema or atelectasis.   Electronically Signed   By: Charlett Nose M.D.   On: 08/19/2013 10:17   Dg Chest Port 1 View  08/15/2013   CLINICAL DATA:  Short of breath  EXAM: PORTABLE CHEST - 1 VIEW  COMPARISON:  Radiograph 06/17/2013  FINDINGS: Normal mediastinum and cardiac silhouette. Normal pulmonary vasculature. No effusion, infiltrate, pneumothorax. Mild left basilar atelectasis.  IMPRESSION: Mild left basilar atelectasis.  No pulmonary edema.   Electronically Signed   By: Genevive Bi M.D.   On: 08/15/2013 15:48     Assessment and Plan  1. Acute respiratory failure with hypoxia - likely secondary to  #2.  2. Acute on chronic diastolic heart failure - in the setting of dietary indiscretion and class IV-V CKD. Echo 5/29 EF 50-55%, mild to mod MR, PA peak pressure 39 mmHg. Nephrology managing diuretics - she has responded well to higher doses. She may be nearing baseline - will discuss with MD. Now off supp O2. As BP increases, will need to consider re-instituting afterload reduction. May ultimately need RRT to maintain euvolemia. 3. 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 Per renal.  3. Long-standing uncontrolled hypertension - currently controlled.  4. Long-standing diabetes mellitus - per IM. Intermittent hypoglycemia. 5. CAD, moderate by cath 09/2010 (for medical therapy) - continue aspirin, Plavix as Hgb allows. Continue BB, statin. No angina.  6. Hypothyroidism - on levothyroxine.  7. Anemia of chronic disease 2/2 CKD - per IM.  8. PVD - s/p L BKA. Has sore R heel and toe - just had LEA dopplers done - followed by [facility].  9. Hypoalbuminemia - may be indicative of malnutrition. Dietician consult to assess nutritional status.  Signed, Ronie Spies PA-C   I have seen and evaluated the patient this PM along with Ronie Spies, PA. I agree with her findings, examination as well as impression recommendations.  Continues to diurese on IV lasix - getting close to euvolemia -- very difficult to assess her volume status; I suspect that we may be ready to convert to PO in the next 24-48 hrs.  Perhaps when the Creatinine level stops improving with lasix IV we will then know we are euvolemic.  BP & HR stable.  Agree with Dietary consult.   Marykay Lex, M.D., M.S. Interventional Cardiologist   Pager # [phone] 08/21/2013

## 2013-08-22 DIAGNOSIS — E875 Hyperkalemia: Secondary | ICD-10-CM

## 2013-08-22 DIAGNOSIS — E440 Moderate protein-calorie malnutrition: Secondary | ICD-10-CM

## 2013-08-22 DIAGNOSIS — I5033 Acute on chronic diastolic (congestive) heart failure: Secondary | ICD-10-CM

## 2013-08-22 DIAGNOSIS — J9600 Acute respiratory failure, unspecified whether with hypoxia or hypercapnia: Secondary | ICD-10-CM

## 2013-08-22 LAB — GLUCOSE, CAPILLARY
Glucose-Capillary: 72 mg/dL (ref 70–99)
Glucose-Capillary: 93 mg/dL (ref 70–99)
Glucose-Capillary: 160 mg/dL — ABNORMAL HIGH (ref 70–99)
Glucose-Capillary: 225 mg/dL — ABNORMAL HIGH (ref 70–99)
Glucose-Capillary: 222 mg/dL — ABNORMAL HIGH (ref 70–99)

## 2013-08-22 LAB — BASIC METABOLIC PANEL
BUN: 73 mg/dL — AB (ref 6–23)
CHLORIDE: 99 meq/L (ref 96–112)
CO2: 32 mEq/L (ref 19–32)
Calcium: 9.5 mg/dL (ref 8.4–10.5)
Creatinine, Ser: 2.79 mg/dL — ABNORMAL HIGH (ref 0.50–1.10)
GFR calc Af Amer: 16 mL/min — ABNORMAL LOW (ref >90)
GFR, EST NON AFRICAN AMERICAN: 14 mL/min — AB (ref >90)
Glucose, Bld: 91 mg/dL (ref 70–99)
Potassium: 3.6 mEq/L — ABNORMAL LOW (ref 3.7–5.3)
Sodium: 143 mEq/L (ref 137–147)

## 2013-08-22 MED ORDER — FUROSEMIDE 80 MG PO TABS
200.0000 mg | ORAL_TABLET | Freq: Three times a day (TID) | ORAL | Status: DC
  Administered 2013-08-22 – 2013-08-23 (×4): 200 mg via ORAL
  Filled 2013-08-22 (×7): qty 1

## 2013-08-22 MED ORDER — POTASSIUM CHLORIDE CRYS ER 20 MEQ PO TBCR
40.0000 meq | EXTENDED_RELEASE_TABLET | Freq: Once | ORAL | Status: AC
  Administered 2013-08-22: 40 meq via ORAL
  Filled 2013-08-22: qty 2

## 2013-08-22 NOTE — Progress Notes (Signed)
Bed offers given to patient's daughter [name] (patient defers to her daughter)- wants either [facility] or [facility] and both facilities offered. Daughter will discuss with patient and call back regarding choice. Possible d/c tomorrow to SNF per MD if continues to improve.  CSW to monitor.  [name]. [place]  [phone]

## 2013-08-22 NOTE — Progress Notes (Signed)
Admit: 08/15/2013 LOS: 7  42F w/ AoC diastolic CHF exacerbation and CKD4 w/ volume overload  Subjective:  Cont with effective diuresis on 160 QID IV lasix SCr stable Pt w/o c/o. Denies SOB  06/03 0701 - 06/04 0700 In: 1341 [P.O.:480; I.V.:3; IV Piggyback:858] Out: 1850 [Urine:1850]  Filed Weights   08/20/13 0611 08/21/13 0501 08/22/13 0554  Weight: 55.3 kg (121 lb 14.6 oz) 53.8 kg (118 lb 9.7 oz) 52.844 kg (116 lb 8 oz)    Current meds: reviewed includes lasix 200 TID PO, PhosLo 1qAC, Renveal 1qAC, Calcitriol 0.25 daily, NaHCO3 1300 BID Current Labs: reviewed    Physical Exam:  Blood pressure 124/66, pulse 65, temperature 97.9 F (36.6 C), temperature source Oral, resp. rate 18, height 4\' 11"  (1.499 m), weight 52.844 kg (116 lb 8 oz), SpO2 97.00%. Elderly chronically ill female in bed Nl WOB, CTAB ant auscultation RRR. Nl s1s2 S/nt nabs Nonfocal  Assessment 1. CKD4, improving  2. Volume overload, improving and responsive to high dose diuretics 3. A/C Diastolic HF 4. Hyperphosphatemia on PhosLo/Renvela 5. HPTH on VDRA 6. Anemia TSAT 8% s/p 510mg  ferahema 08/19/13 7. Metabolic Acidosis on NaHCO3 1300 BID, improved; now mild alkalosis likely from NaHCO3 and volume contraction  Plan 1. Convert to lasix 200mg  TID PO today 2. Cont binders, VDRA 3. Stop NaHCO3 4. Hb in AM s/p IV Fe 6/1 5. No indication for RRT  Sabra Heck MD 08/22/2013, 11:28 AM   Recent Labs Lab 08/19/13 0451 08/20/13 0532 08/21/13 0422 08/22/13 0417  NA 133* 140 137 143  K 5.1 4.6 4.2 3.6*  CL 96 98 94* 99  CO2 19 26 29  32  GLUCOSE 119* 40* 98 91  BUN 96* 90* 82* 73*  CREATININE 3.45* 3.19* 2.81* 2.79*  CALCIUM 9.0 9.9 9.6 9.5  PHOS 5.1*  --   --   --     Recent Labs Lab 08/15/13 1424 08/19/13 0451  WBC 10.5 6.8  NEUTROABS 8.1*  --   HGB 8.6* 8.4*  HCT 27.5* 25.9*  MCV 85.7 83.0  PLT 357 PLATELET CLUMPS NOTED ON SMEAR, COUNT APPEARS ADEQUATE

## 2013-08-22 NOTE — Progress Notes (Signed)
Subjective: Feeling better.    Objective: Vital signs in last 24 hours: Temp:  [97.1 F (36.2 C)-97.9 F (36.6 C)] 97.9 F (36.6 C) (06/04 0554) Pulse Rate:  [65-76] 65 (06/04 0554) Resp:  [18-20] 18 (06/04 0554) BP: (124-133)/(56-68) 124/66 mmHg (06/04 0554) SpO2:  [95 %-100 %] 97 % (06/04 0554) Weight:  [116 lb 8 oz (52.844 kg)] 116 lb 8 oz (52.844 kg) (06/04 0554) Last BM Date: 08/22/13 (very small stool)  Intake/Output from previous day: 06/03 0701 - 06/04 0700 In: 1341 [P.O.:480; I.V.:3; IV Piggyback:858] Out: 1850 [Urine:1850] Intake/Output this shift: Total I/O In: 200 [P.O.:200] Out: -   Medications Current Facility-Administered Medications  Medication Dose Route Frequency Provider Last Rate Last Dose  . 0.9 %  sodium chloride infusion  250 mL Intravenous PRN Alison MurrayAlma M Devine, MD 10 mL/hr at 08/21/13 1038 250 mL at 08/21/13 1038  . acetaminophen (TYLENOL) tablet 650 mg  650 mg Oral Q6H PRN Alison MurrayAlma M Devine, MD      . allopurinol (ZYLOPRIM) tablet 100 mg  100 mg Oral Daily Alison MurrayAlma M Devine, MD   100 mg at 08/21/13 1054  . aspirin EC tablet 81 mg  81 mg Oral Daily Alison MurrayAlma M Devine, MD   81 mg at 08/21/13 1053  . calcitRIOL (ROCALTROL) capsule 0.25 mcg  0.25 mcg Oral Daily Alison MurrayAlma M Devine, MD   0.25 mcg at 08/21/13 1055  . calcium acetate (PHOSLO) capsule 667 mg  667 mg Oral TID WC Alison MurrayAlma M Devine, MD   667 mg at 08/22/13 0841  . carvedilol (COREG) tablet 3.125 mg  3.125 mg Oral BID WC Jerald KiefStephen K Chiu, MD   3.125 mg at 08/22/13 [id number]  . clopidogrel (PLAVIX) tablet 75 mg  75 mg Oral Q breakfast Alison MurrayAlma M Devine, MD   75 mg at 08/22/13 0650  . docusate sodium (COLACE) capsule 100 mg  100 mg Oral BID Vassie Lollarlos Madera, MD      . feeding supplement (GLUCERNA SHAKE) (GLUCERNA SHAKE) liquid 237 mL  237 mL Oral Daily PRN Lorraine Laxeanne J Barnett, RD      . furosemide (LASIX) 160 mg in dextrose 5 % 50 mL IVPB  160 mg Intravenous Q6H Trevor IhaJames L Deterding, MD   160 mg at 08/22/13 0746  . gabapentin (NEURONTIN)  capsule 300 mg  300 mg Oral TID Alison MurrayAlma M Devine, MD   300 mg at 08/21/13 2141  . HYDROcodone-acetaminophen (NORCO/VICODIN) 5-325 MG per tablet 1 tablet  1 tablet Oral BID PRN Alison MurrayAlma M Devine, MD   1 tablet at 08/16/13 1820  . insulin aspart (novoLOG) injection 0-15 Units  0-15 Units Subcutaneous TID WC Jerald KiefStephen K Chiu, MD   3 Units at 08/20/13 1759  . insulin glargine (LANTUS) injection 20 Units  20 Units Subcutaneous QHS Vassie Lollarlos Madera, MD      . ipratropium-albuterol (DUONEB) 0.5-2.5 (3) MG/3ML nebulizer solution 3 mL  3 mL Nebulization Q2H PRN Jerald KiefStephen K Chiu, MD      . ipratropium-albuterol (DUONEB) 0.5-2.5 (3) MG/3ML nebulizer solution 3 mL  3 mL Nebulization TID Jerald KiefStephen K Chiu, MD   3 mL at 08/22/13 0853  . isosorbide mononitrate (IMDUR) 24 hr tablet 60 mg  60 mg Oral Daily Alison MurrayAlma M Devine, MD   60 mg at 08/21/13 1055  . latanoprost (XALATAN) 0.005 % ophthalmic solution 1 drop  1 drop Both Eyes QHS Alison MurrayAlma M Devine, MD   1 drop at 08/21/13 2140  . levothyroxine (SYNTHROID, LEVOTHROID) tablet 75 mcg  75 mcg Oral QAC breakfast Alison Murray, MD   75 mcg at 08/22/13 [phone]  . linagliptin (TRADJENTA) tablet 5 mg  5 mg Oral Daily Alison Murray, MD   5 mg at 08/21/13 1055  . multivitamin (RENA-VIT) tablet 1 tablet  1 tablet Oral QHS Alison Murray, MD   1 tablet at 08/21/13 2140  . nitroGLYCERIN (NITROSTAT) SL tablet 0.4 mg  0.4 mg Sublingual Q5 min PRN Alison Murray, MD      . nortriptyline (PAMELOR) capsule 50 mg  50 mg Oral QHS Alison Murray, MD   50 mg at 08/21/13 2140  . ondansetron (ZOFRAN) injection 4 mg  4 mg Intravenous Q6H PRN Jerald Kief, MD      . polyethylene glycol (MIRALAX / GLYCOLAX) packet 17 g  17 g Oral BID [name], MD      . predniSONE (DELTASONE) tablet 20 mg  20 mg Oral Q breakfast [name], MD   20 mg at 08/22/13 0650  . sevelamer carbonate (RENVELA) tablet 800 mg  800 mg Oral TID WC Alison Murray, MD   800 mg at 08/22/13 0841  . simvastatin (ZOCOR) tablet 20 mg  20 mg Oral  q1800 Alison Murray, MD   20 mg at 08/21/13 1800  . sodium bicarbonate tablet 1,300 mg  1,300 mg Oral BID Trevor Iha, MD   1,300 mg at 08/21/13 2139  . sodium chloride 0.9 % injection 3 mL  3 mL Intravenous Q12H Alison Murray, MD   3 mL at 08/21/13 2140  . sodium chloride 0.9 % injection 3 mL  3 mL Intravenous PRN Alison Murray, MD   3 mL at 08/17/13 0955    PE: General appearance: alert, cooperative and no distress Lungs: mild crackles Heart: regular rate and rhythm and no mm Abdomen: +BS nontender Extremities: No right LEE Pulses: 2+ right radial Skin: Warm and dry Neurologic: Grossly normal  Lab Results:  No results found for this basename: WBC, HGB, HCT, PLT,  in the last 72 hours BMET  Recent Labs  08/20/13 0532 08/21/13 0422 08/22/13 0417  NA 140 137 143  K 4.6 4.2 3.6*  CL 98 94* 99  CO2 26 29 32  GLUCOSE 40* 98 91  BUN 90* 82* 73*  CREATININE 3.19* 2.81* 2.79*  CALCIUM 9.9 9.6 9.5      Assessment/Plan  1. Acute respiratory failure with hypoxia -  likely secondary to #2.  2. Acute on chronic diastolic heart failure -  In the setting of dietary indiscretion and class IV-V CKD. Echo 5/29 EF 50-55%, mild to mod MR, PA peak pressure 39 mmHg. Nephrology managing diuretics.  Net fluids:  -0.5L/-4.6L  3. CKD stage 4-5  pending fistula insertion, was on transient HD Oct 2014, failed LUA AV fistula placement, kept putting off access placement earlier this yr.  3. Long-standing uncontrolled hypertension - currently controlled.  4. Long-standing diabetes mellitus - per IM. Intermittent hypoglycemia.   Currently controlled.  5. CAD,  moderate by cath 09/2010 (for medical therapy) - continue aspirin, Plavix as Hgb allows. Continue BB, statin. No angina.  6. Hypothyroidism - on levothyroxine.  7. Anemia of chronic disease 2/2 CKD - per IM.  8. PVD - s/p L BKA. Has sore R heel and toe - just had LEA dopplers done - followed by [facility].  9. Hypoalbuminemia - may be  indicative of malnutrition. Dietician consult to assess nutritional status.    LOS:  7 days    Wilburt Finlay PA-C 08/22/2013 9:54 AM

## 2013-08-22 NOTE — Progress Notes (Signed)
[facility] PROGRESS NOTE  [name] ZOX:[id number] DOB: 1923-10-29 DOA: 08/15/2013 PCP: Georgianne Fick, MD  Assessment/Plan: Acute respiratory failure with hypoxia  Likely secondary to acute on chronic diastolic CHF exacerbation  Daily weight and strict intake and output  Continue her home meds: coreg, imdur, hydralazine, Norvasc, aspirin and plavix  Increased resp effort with decreased BS and end-expiratory wheezing on 6/1 with repeat CXR demonstrating worsened pulm edema Nephrology assisting with diuresis. Currently in process to transition lasix to PO as recommended by nephrology  Currently remains on 2L [place]; will continue weaning off as tolerated    Acute on chronic diastolic CHF (congestive heart failure)  Continue coreg  Lasix PO 200mg  TID Cont with daily weight and strict intake and output  Replete electrolytes as need  2 D ECHO with EF 50-55% Thus far net neg 4.1L since admit Appreciate input from Cardiology and Nephrology Patient with good diuresis   CAD - moderate at cath July 2012 (medical Rx)  Continue coreg, aspirin and plavix Cardiology on board, will follow rec's  Hypothyroidism  Continue levothyroxine  Peripheral vascular/arterial disease  Status post left BKA  Continue aspirin and plavix Needs close follow up given pain in her right foot.  Dyslipidemia  Continue statin therapy   Chronic renal insufficiency, stage IV (severe)  Continue Calcitrol and renvela Cr improving with diuresis Appreciate Nephrology assistance Diuresis managed by renal; plans is to change lasiX to 200mg  TID PO  Anemia of chronic disease  Secondary to CKD  Hemoglobin stable and no signs of overt bleeding No current indications for transfusion   Constipation  Will continue daily colace and miralax  Dysphagia  Will follow SPT evaluation and rec's.  Right foot pain  Mild swelling appreciated; patient reports hx of gout in the past. Most likely flare due to  aggressive diuresis  Will continue trial with prednisone  Had also hx of PAD, recent arterial dopplers done; patient actively follow by vascular surgery   Moderate protein calorie malnutrition  Continue feeding supplements as recommended by dietician  Hyperkalemia   Stable and WNL currently Cont to follow electrolytes   Code Status: Full Family Communication: no family at bedside Disposition Plan: Pending   Consultants:  Cardiology  Nephrology  Procedures:  See below for x-ray reports  Antibiotics:  None   HPI/Subjective: Feeling and breathing better. No CP or fever. Continue having discomfort on her right foot; but slightly better she said.  Objective: Filed Vitals:   08/21/13 2049 08/21/13 2137 08/22/13 0147 08/22/13 0554  BP:  133/68 133/56 124/66  Pulse:  76 75 65  Temp:  97.8 F (36.6 C)  97.9 F (36.6 C)  TempSrc:  Oral  Oral  Resp:  18  18  Height:      Weight:    52.844 kg (116 lb 8 oz)  SpO2: 95% 100%  97%    Intake/Output Summary (Last 24 hours) at 08/22/13 1327 Last data filed at 08/22/13 1305  Gross per 24 hour  Intake   1061 ml  Output   1800 ml  Net   -739 ml   Filed Weights   08/20/13 0611 08/21/13 0501 08/22/13 0554  Weight: 55.3 kg (121 lb 14.6 oz) 53.8 kg (118 lb 9.7 oz) 52.844 kg (116 lb 8 oz)    Exam:   General:  Afebrile, in NAD; breathing better and feeling a lot better  Cardiovascular: regular, s1, s2; no rubs or gallops  Respiratory: improved air movement; no wheezing and no crackles  Abdomen: soft, nondistended, positive BS  Musculoskeletal: perfused, no clubbing   Data Reviewed: Basic Metabolic Panel:  Recent Labs Lab 08/15/13 2015  08/18/13 0440 08/19/13 0451 08/20/13 0532 08/21/13 0422 08/22/13 0417  NA 137  < > 134* 133* 140 137 143  K 5.7*  < > 5.1 5.1 4.6 4.2 3.6*  CL 100  < > 97 96 98 94* 99  CO2 18*  < > 18* 19 26 29  32  GLUCOSE 128*  < > 131* 119* 40* 98 91  BUN 79*  < > 98* 96* 90* 82* 73*   CREATININE 3.04*  < > 3.67* 3.45* 3.19* 2.81* 2.79*  CALCIUM 8.7  < > 8.7 9.0 9.9 9.6 9.5  MG 2.6*  --   --   --   --   --   --   PHOS  --   --   --  5.1*  --   --   --   < > = values in this interval not displayed. Liver Function Tests:  Recent Labs Lab 08/15/13 1424 08/15/13 2015 08/19/13 0451  AST 23 29 19   ALT 20 20 14   ALKPHOS 83 86 76  BILITOT 0.5 0.6 0.3  PROT 7.4 7.1 6.3  ALBUMIN 3.1* 3.0* 2.5*   CBC:  Recent Labs Lab 08/15/13 1424 08/19/13 0451  WBC 10.5 6.8  NEUTROABS 8.1*  --   HGB 8.6* 8.4*  HCT 27.5* 25.9*  MCV 85.7 83.0  PLT 357 PLATELET CLUMPS NOTED ON SMEAR, COUNT APPEARS ADEQUATE   BNP (last 3 results)  Recent Labs  06/10/13 1645 08/15/13 1424 08/15/13 2015  PROBNP 1297.00* 30760.0* 31926.0*   CBG:  Recent Labs Lab 08/21/13 1223 08/21/13 1635 08/21/13 2213 08/22/13 0626 08/22/13 0739  GLUCAP 73 92 180* 72 93     Studies: No results found.  Scheduled Meds: . allopurinol  100 mg Oral Daily  . aspirin EC  81 mg Oral Daily  . calcitRIOL  0.25 mcg Oral Daily  . calcium acetate  667 mg Oral TID WC  . carvedilol  3.125 mg Oral BID WC  . clopidogrel  75 mg Oral Q breakfast  . docusate sodium  100 mg Oral BID  . furosemide  200 mg Oral TID  . gabapentin  300 mg Oral TID  . insulin aspart  0-15 Units Subcutaneous TID WC  . insulin glargine  20 Units Subcutaneous QHS  . ipratropium-albuterol  3 mL Nebulization TID  . isosorbide mononitrate  60 mg Oral Daily  . latanoprost  1 drop Both Eyes QHS  . levothyroxine  75 mcg Oral QAC breakfast  . linagliptin  5 mg Oral Daily  . multivitamin  1 tablet Oral QHS  . nortriptyline  50 mg Oral QHS  . polyethylene glycol  17 g Oral BID  . predniSONE  20 mg Oral Q breakfast  . sevelamer carbonate  800 mg Oral TID WC  . simvastatin  20 mg Oral q1800  . 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 If 7PM-7AM, please contact night-coverage at [email or web address], password [facility] 08/22/2013, 1:27 PM  LOS: 7 days

## 2013-08-22 NOTE — Plan of Care (Signed)
Problem: Phase I Progression Outcomes Goal: EF % per last Echo/documented,Core Reminder form on chart Outcome: Completed/Met Date Met:  08/22/13 EF 50-55%(08-16-13)     

## 2013-08-22 NOTE — Evaluation (Signed)
Clinical/Bedside Swallow Evaluation Patient Details  Name: [name] MRN: [id number] Date of Birth: 09-05-1923  Today's Date: 08/22/2013 Time: 3729-0211 SLP Time Calculation (min): 23 min  Past Medical History:  Past Medical History  Diagnosis Date  . Coronary artery disease     a. Cath 09/2010 - med rx.  Marland Kitchen TIA (transient ischemic attack)   . Hypertension   . Shingles   . Stroke   . Anemia   . Cardiomyopathy, idiopathic 02/08/2011  . Myocardial infarct     x 3 unsure of years  . Irregular heartbeat   . Ulcer   . GERD (gastroesophageal reflux disease)   . Hx of transient ischemic attack (TIA)   . Memory loss   . Chronic diastolic CHF (congestive heart failure)     Takes Lasix  . Gout     takes allopurinol  . Peripheral vascular disease     a. s/p L BKA.  . Diabetes mellitus     type 2 NIDDM  . Chronic kidney disease (CKD), stage IV (severe)   . Arthritis   . Hypothyroidism     (SEVERE) Takes Levothryroxine  . Nonischemic cardiomyopathy     EF now is 55%, reduced due to myxedema, which is improved.  . Dyslipidemia   . Peripheral neuropathy   . H/O echocardiogram 09/06/11    Indication- nonIschemic Cardiomyopathy. EF = now greater than 55% with no regional wall motion abnormalities. Tthere is mild to moderate trisuspid regurgitayion and mild pulmonary hypertension with an RVSP of 35 mmHg as well as stage 1 diastolic dysfunction and mild to moderate LVH.  Marland Kitchen Abnormal nuclear stress test 06/01/09    Demonstrated a new area of infarct scar, peri-infarct ischemia seen in the inferolateral territory. EF eas normal at 70% with mild hypocontractility at the apex, distal inferolateral wall.   Past Surgical History:  Past Surgical History  Procedure Laterality Date  . Back surgery      [facility]  . Eye surgery      Left eye surgery; cataract removal  . Left bka  07/05/2011  . Av fistula placement    . Amputation  07/05/2011    Procedure: AMPUTATION BELOW KNEE;   Surgeon: Chuck Hint, MD;  Location: [facility] OR;  Service: Vascular;  Laterality: Left;  . Angioplasty  1988  . Cardiac catheterization  09/28/07    Demonstrated multiple sequential lesions around 40 to 30% in the RCA territory.  . Left lower extremity venous duplex Left 06/27/11    Summary: No evidence of DVT involving the left lower extremity and right common femoral vein.   . Lower extremity arterial evaluation  06/27/11    SUMMARY: Right: ABI not ascertained due to false elevation in BP secondary to calcification (posterior tibial artery is non compressible). Left: ABI indicates moderate reduction in arterial flow. Bilateral: Great toe PPG waveforms indicate adequate perfusion. Great toe pressures not obtained due to patient's movements secondary to pain.  . Duplex doppler  05/10/11    LE arterial dopplers demonstrate bilaterally reduced ABIs of 0.91 on right & 0.56 on left. She does report some decreased pain on the left, & there's moderate mixed-density plaque in the right SFA w/50 to 69% reduction. There's a 69% reduction in the left SFA & does appear to be occlusive disease of left posterior tibial artery. Right posterior dorsalis pedis artery demonstrates occlusive disease  . Insertion of dialysis catheter N/A 01/06/2013    Procedure: INSERTION OF DIALYSIS CATHETER;  Surgeon:  Larina Earthlyodd F Early, MD;  [facility];  Service: Vascular;  Laterality: N/A;   HPI:  80 year old female with past medical history significant for diastolic CHF, left BKA, hypothyroidism, CKD stage IV, TIA, CVA, MI, GERD, memory loss, PVD, DM, CHF presented to ED with worsening shortness of breath.  CXR 6/1 revealed Increasing vascular congestion and probable mild interstitial edema/ CHF. Small bilateral effusions. Bibasilar edema or atelectasis.    Assessment / Plan / Recommendation Clinical Impression  Pt. demonstrated difficulty transiting large pills with applesauce with repeated trials.  Oral transit assisted transit  with Ensure pudding possibly due to it's moister texture.  No indications of pharyngeal deficits and able to masticate and transfer solid texture in an efficient/timely manner.  Recommend she continue regular texture diet and thin liquids, crush pills or whole with pudding if unable to crush.  No f/u needed.    Aspiration Risk  Mild    Diet Recommendation Regular;Thin liquid   Liquid Administration via: Cup;Straw Medication Administration: Crushed with puree Supervision: Staff to assist with self feeding;Full supervision/cueing for compensatory strategies Compensations: Slow rate;Small sips/bites;Check for pocketing Postural Changes and/or Swallow Maneuvers: Seated upright 90 degrees;Upright 30-60 min after meal    Other  Recommendations Oral Care Recommendations: Oral care BID   Follow Up Recommendations  None    Frequency and Duration        Pertinent Vitals/Pain WDL         Swallow Study         Oral/Motor/Sensory Function Overall Oral Motor/Sensory Function: Appears within functional limits for tasks assessed   Ice Chips Ice chips: Not tested   Thin Liquid Thin Liquid: Within functional limits Presentation: Cup;Straw    Nectar Thick Nectar Thick Liquid: Not tested   Honey Thick Honey Thick Liquid: Not tested   Puree Puree: Within functional limits   Solid   GO    Solid: Within functional limits       Royce MacadamiaLisa Willis Kieu Quiggle M.Ed [employer]-SLP Pager [phone]  08/22/2013

## 2013-08-22 NOTE — Progress Notes (Signed)
Pt seen & examined.  Agree with Mr. Leron Croak.  Per d/w Dr. Alferd Patee - converting to PO Lasix.   Renal Fnx stable to improved.  Breathing better -- needs to mobilize.  Marykay Lex, M.D., M.S. Interventional Cardiologist   Pager # [phone] 08/22/2013

## 2013-08-23 DIAGNOSIS — N19 Unspecified kidney failure: Secondary | ICD-10-CM

## 2013-08-23 DIAGNOSIS — I10 Essential (primary) hypertension: Secondary | ICD-10-CM

## 2013-08-23 LAB — BASIC METABOLIC PANEL
BUN: 71 mg/dL — ABNORMAL HIGH (ref 6–23)
BUN: 70 mg/dL — AB (ref 6–23)
CHLORIDE: 91 meq/L — AB (ref 96–112)
CHLORIDE: 94 meq/L — AB (ref 96–112)
CO2: 33 mEq/L — ABNORMAL HIGH (ref 19–32)
CO2: 32 meq/L (ref 19–32)
Calcium: 10 mg/dL (ref 8.4–10.5)
Calcium: 9.9 mg/dL (ref 8.4–10.5)
Creatinine, Ser: 2.64 mg/dL — ABNORMAL HIGH (ref 0.50–1.10)
Creatinine, Ser: 2.62 mg/dL — ABNORMAL HIGH (ref 0.50–1.10)
GFR calc Af Amer: 17 mL/min — ABNORMAL LOW (ref >90)
GFR calc non Af Amer: 15 mL/min — ABNORMAL LOW (ref >90)
GFR, EST AFRICAN AMERICAN: 18 mL/min — AB (ref >90)
GFR, EST NON AFRICAN AMERICAN: 15 mL/min — AB (ref >90)
GLUCOSE: 141 mg/dL — AB (ref 70–99)
Glucose, Bld: 148 mg/dL — ABNORMAL HIGH (ref 70–99)
POTASSIUM: 5.9 meq/L — AB (ref 3.7–5.3)
POTASSIUM: 5.3 meq/L (ref 3.7–5.3)
Sodium: 138 mEq/L (ref 137–147)
Sodium: 139 mEq/L (ref 137–147)

## 2013-08-23 LAB — GLUCOSE, CAPILLARY
GLUCOSE-CAPILLARY: 162 mg/dL — AB (ref 70–99)
Glucose-Capillary: 170 mg/dL — ABNORMAL HIGH (ref 70–99)

## 2013-08-23 LAB — CBC
HEMATOCRIT: 35.9 % — AB (ref 36.0–46.0)
Hemoglobin: 10.6 g/dL — ABNORMAL LOW (ref 12.0–15.0)
MCH: 26.4 pg (ref 26.0–34.0)
MCHC: 29.5 g/dL — ABNORMAL LOW (ref 30.0–36.0)
MCV: 89.5 fL (ref 78.0–100.0)
Platelets: 492 10*3/uL — ABNORMAL HIGH (ref 150–400)
RBC: 4.01 MIL/uL (ref 3.87–5.11)
RDW: 20.1 % — AB (ref 11.5–15.5)
WBC: 11.9 10*3/uL — ABNORMAL HIGH (ref 4.0–10.5)

## 2013-08-23 MED ORDER — DSS 100 MG PO CAPS: 100.0000 mg | ORAL_CAPSULE | Freq: Two times a day (BID) | ORAL | Status: DC

## 2013-08-23 MED ORDER — HYDROCODONE-ACETAMINOPHEN 5-325 MG PO TABS: 1.0000 | ORAL_TABLET | Freq: Two times a day (BID) | ORAL | Status: DC | PRN

## 2013-08-23 MED ORDER — ISOSORB DINITRATE-HYDRALAZINE 20-37.5 MG PO TABS: 1.0000 | ORAL_TABLET | Freq: Two times a day (BID) | ORAL | Status: DC

## 2013-08-23 MED ORDER — PREDNISONE 20 MG PO TABS: 20.0000 mg | ORAL_TABLET | Freq: Every day | ORAL | Status: DC

## 2013-08-23 MED ORDER — POLYETHYLENE GLYCOL 3350 17 G PO PACK: 17.0000 g | PACK | Freq: Every day | ORAL | Status: DC

## 2013-08-23 MED ORDER — INSULIN GLARGINE 100 UNIT/ML ~~LOC~~ SOLN: 22.0000 [IU] | Freq: Every day | SUBCUTANEOUS | Status: DC

## 2013-08-23 MED ORDER — FUROSEMIDE 80 MG PO TABS: 200.0000 mg | ORAL_TABLET | Freq: Three times a day (TID) | ORAL | Status: DC

## 2013-08-23 MED ORDER — IPRATROPIUM-ALBUTEROL 0.5-2.5 (3) MG/3ML IN SOLN: 3.0000 mL | Freq: Four times a day (QID) | RESPIRATORY_TRACT | Status: DC

## 2013-08-23 MED ORDER — CALCITRIOL 0.25 MCG PO CAPS
0.2500 ug | ORAL_CAPSULE | ORAL | Status: DC
  Administered 2013-08-23: 0.25 ug via ORAL
  Filled 2013-08-23: qty 1

## 2013-08-23 MED ORDER — GLUCERNA SHAKE PO LIQD: 237.0000 mL | Freq: Every day | ORAL | Status: DC | PRN

## 2013-08-23 NOTE — Progress Notes (Signed)
Occupational Therapy Treatment Patient Details Name: [name] MRN: [id number] DOB: Nov 22, 1923 Today's Date: 08/23/2013    History of present illness 80 year old female with past medical history significant for diastolic CHF, last 2 D ECHO in 2014 showed EF 65%, left BKA, hypothyroidism, CKD stage IV, dyslipidemia who presented to [facility] ED 08/15/2013 with worsening shortness of breath started about few days prior to this admission and getting worse in past 24 hours associated with nonproductive cough. Pt reported feeling short of breath with rest and with exertion   OT comments  Pt seen today for ADLs and functional mobility in collaboration with PT. Pt was alert today and willing to perform sit<>stand and squat pivot with +2 max assist. Pt continues to be limited by weakness and poor endurance, however improvement in RUE edema noted.    Follow Up Recommendations  SNF;Supervision/Assistance - 24 hour    Equipment Recommendations  Other (comment) (Defer to next venue)       Precautions / Restrictions Precautions Precautions: Fall Restrictions Weight Bearing Restrictions: No       Mobility Bed Mobility Overal bed mobility: Needs Assistance Bed Mobility: Supine to Sit     Supine to sit: Min guard;HOB elevated     General bed mobility comments: Pt able to transition to EOB with use of bed rails and increased time. Min guard for safety and VC's for sequencing and technique.  Transfers Overall transfer level: Needs assistance Equipment used: 2 person hand held assist Transfers: Sit to/from [employer] Sit to Stand: Max assist;+2 physical assistance   Squat pivot transfers: Max assist;+2 physical assistance     General transfer comment: Pt able to perform sit<>stand x2 with R knee blocked. Second attempt pt able to hold for 25 seconds with +2 support. Squat pivot transfer from bed to recliner towards right side.     Balance Overall balance assessment: Needs  assistance Sitting-balance support: Feet unsupported;Bilateral upper extremity supported Sitting balance-Leahy Scale: Fair Sitting balance - Comments: Feet unable to reach floor due to short stature, however pt able to maintain sitting balance without physical assist.   Standing balance support: Bilateral upper extremity supported;During functional activity Standing balance-Leahy Scale: Zero                     ADL Overall ADL's : Needs assistance/impaired     Grooming: Wash/dry face;Set up;Sitting                   Toilet Transfer: Maximal assistance;+2 for physical assistance;Squat-pivot (from bed> drop arm recliner)             General ADL Comments: Pt performed sit<>stand x2 and squat pivot with +2 max assist from bed to drop arm recliner with successful weight bearing through RLE. Pt performed grooming activities sitting up in recliner.         Perception         Cognition  Arousal/Alertness: Awake/Alert Behavior During Therapy: WFL for tasks assessed/performed Overall Cognitive Status: Within Functional Limits for tasks assessed                                    Pertinent Vitals/ Pain       No c/o pain; NAD         Frequency Min 3X/week     Progress Toward Goals  OT Goals(current goals can now be found in the  care plan section)  Progress towards OT goals: Progressing toward goals     Plan Discharge plan needs to be updated    Co-evaluation    PT/OT/SLP Co-Evaluation/Treatment: Yes Reason for Co-Treatment: For patient/therapist safety PT goals addressed during session: Mobility/safety with mobility OT goals addressed during session: ADL's and self-care      End of Session Equipment Utilized During Treatment: Gait belt;Oxygen   Activity Tolerance Patient tolerated treatment well   Patient Left in chair;with call bell/phone within reach;with chair alarm set           Time: 1110-1133 OT Time Calculation (min):  23 min  Charges: OT General Charges $OT Visit: 1 Procedure OT Treatments $Self Care/Home Management : 8-22 mins  [name] [phone] 08/23/2013, 2:14 PM

## 2013-08-23 NOTE — Progress Notes (Signed)
Patient: [name] / Admit Date: 08/15/2013 / Date of Encounter: 08/23/2013, 8:21 AM  Subjective  No complaints. No CP or SOB.  Objective   Telemetry: brief sinus tach, now no longer on tele, HR controlled this AM  Physical Exam: Blood pressure 145/53, pulse 75, temperature 97.3 F (36.3 C), temperature source Oral, resp. rate 14, height 4\' 11"  (1.499 m), weight 116 lb 6.5 oz (52.8 kg), SpO2 100.00%. General: Well developed elderly AAF in no acute distress.  Head: Normocephalic, atraumatic, sclera non-icteric, no xanthomas, nares are without discharge.  Neck: JVP not elevated.  Lungs: CTAB. No rales or rhonchi. Breathing is unlabored.  Heart: RRR S1 S2 without murmurs, rubs, or gallops.  Abdomen: Soft, non-tender, non-distended with normoactive bowel sounds. No rebound/guarding.  Extremities: No clubbing or cyanosis. No edema. S/p L BKA.  Neuro: Alert and oriented X 3. Follows commands.  Psych: Responds to questions appropriately with a pleasant but somewhat slowed affect.   Intake/Output Summary (Last 24 hours) at 08/23/13 0821 Last data filed at 08/23/13 0600  Gross per 24 hour  Intake    820 ml  Output   1850 ml  Net  -1030 ml    Inpatient Medications:  . allopurinol  100 mg Oral Daily  . aspirin EC  81 mg Oral Daily  . calcitRIOL  0.25 mcg Oral Daily  . calcium acetate  667 mg Oral TID WC  . carvedilol  3.125 mg Oral BID WC  . clopidogrel  75 mg Oral Q breakfast  . docusate sodium  100 mg Oral BID  . furosemide  200 mg Oral TID  . gabapentin  300 mg Oral TID  . insulin aspart  0-15 Units Subcutaneous TID WC  . insulin glargine  20 Units Subcutaneous QHS  . ipratropium-albuterol  3 mL Nebulization TID  . isosorbide mononitrate  60 mg Oral Daily  . latanoprost  1 drop Both Eyes QHS  . levothyroxine  75 mcg Oral QAC breakfast  . linagliptin  5 mg Oral Daily  . multivitamin  1 tablet Oral QHS  . nortriptyline  50 mg Oral QHS  . polyethylene glycol  17 g Oral BID    . predniSONE  20 mg Oral Q breakfast  . sevelamer carbonate  800 mg Oral TID WC  . simvastatin  20 mg Oral q1800  . sodium chloride  3 mL Intravenous Q12H   Infusions:    Labs:  Recent Labs  08/21/13 0422 08/22/13 0417  NA 137 143  K 4.2 3.6*  CL 94* 99  CO2 29 32  GLUCOSE 98 91  BUN 82* 73*  CREATININE 2.81* 2.79*  CALCIUM 9.6 9.5     Radiology/Studies:  Dg Chest Port 1 View  08/19/2013   CLINICAL DATA:  Shortness of breath.  EXAM: PORTABLE CHEST - 1 VIEW  COMPARISON:  08/15/2013  FINDINGS: Cardiomegaly. Vascular congestion with increasing interstitial prominence and bibasilar opacities. Favor edema/CHF. Bibasilar opacities could also reflect atelectasis. Small bilateral pleural effusions. No acute bony abnormality.  IMPRESSION: Increasing vascular congestion and probable mild interstitial edema/ CHF.  Small bilateral effusions.  Bibasilar edema or atelectasis.   Electronically Signed   By: Charlett Nose M.D.   On: 08/19/2013 10:17   Dg Chest Port 1 View  08/15/2013   CLINICAL DATA:  Short of breath  EXAM: PORTABLE CHEST - 1 VIEW  COMPARISON:  Radiograph 06/17/2013  FINDINGS: Normal mediastinum and cardiac silhouette. Normal pulmonary vasculature. No effusion, infiltrate, pneumothorax. Mild left basilar  atelectasis.  IMPRESSION: Mild left basilar atelectasis.  No pulmonary edema.   Electronically Signed   By: Genevive BiStewart  Edmunds M.D.   On: 08/15/2013 15:48     Assessment and Plan  1. Acute respiratory failure with hypoxia - likely secondary to #2.  2. Acute on chronic diastolic heart failure - in the setting of dietary indiscretion and class IV-V CKD. Echo 5/29 EF 50-55%, mild to mod MR, PA peak pressure 39 mmHg. Nephrology managing diuretics - she has responded well to higher doses and was able to be changed to oral form yesterday. As BP increases, will need to consider re-instituting afterload reduction although don't want BP too low for renal perfusion's sake. May ultimately need  RRT to maintain euvolemia. PT working to mobilize.  3. 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 Per renal.  3. Long-standing uncontrolled hypertension - currently controlled.  4. Long-standing diabetes mellitus - per IM. Intermittent hypoglycemia.  5. CAD, moderate by cath 09/2010 (for medical therapy) - continue aspirin, Plavix as Hgb allows. Continue BB, statin. No angina.  6. Hypothyroidism - on levothyroxine.  7. Anemia of chronic disease 2/2 CKD - per IM.  8. PVD - s/p L BKA. Has sore R heel and toe - just had LEA dopplers done - followed by [facility].  9. Moderate malnutrition in context of chronic illness - Glucerna added along with stool softener.  Signed, Ronie Spiesayna Orvis Stann PA-C

## 2013-08-23 NOTE — Progress Notes (Signed)
Admit: 08/15/2013 LOS: 8  54F w/ AoC diastolic CHF exacerbation and CKD4 w/ volume overload  Subjective:  Ongoing effective diuresis on PO lasix Pt w/o complaints this AM Still frail appearing K 5.9 with concern for hemolysis, repeat 5.3 SCr stable/improved Weight stable Off NaHCO3  06/04 0701 - 06/05 0700 In: 820 [P.O.:820] Out: 1850 [Urine:1850]  Filed Weights   08/21/13 0501 08/22/13 0554 08/23/13 0447  Weight: 53.8 kg (118 lb 9.7 oz) 52.844 kg (116 lb 8 oz) 52.8 kg (116 lb 6.5 oz)    Current meds: reviewed includes lasix 200 TID PO, PhosLo 1qAC, Renveal 1qAC, Calcitriol 0.25 daily, NaHCO3 1300 BID Current Labs: reviewed    Physical Exam:  Blood pressure 145/53, pulse 75, temperature 97.3 F (36.3 C), temperature source Oral, resp. rate 14, height 4\' 11"  (1.499 m), weight 52.8 kg (116 lb 6.5 oz), SpO2 98.00%. Elderly chronically ill female in bed Nl WOB, CTAB ant auscultation RRR. Nl s1s2 S/nt nabs Nonfocal  Assessment 1. CKD4, improving, follows with Dr. Darrick Penna at [facility] 2. Volume overload, improving and responsive to high dose diuretics 3. A/C Diastolic HF 4. Hyperphosphatemia on PhosLo/Renvela  5. HPTH on VDRA 6. Mild hypercalcemia 7. Anemia TSAT 8% s/p 510mg  feraheme 08/19/13, improved 8. Metabolic Acidosis previously and now mild alkalosis likely from NaHCO3 and volume contraction and  9. Milk Hyperkalemia, some hemoylysis  Plan 1. Cont lasix 200 PO TID 2. Cont binders, reduce VDRA to qMWF, watch Calcium 3. Should have renal panel checked next week at SNF 4. Stay off NaHCO3 for now 5. Hb improved s/p IV Fe 6/1 6. Will arrange f/u with [facility] in near future 7. Will sign off for now. Pt stable for dc from renal perspective. Please call with questions or concerns.    Sabra Heck MD 08/23/2013, 9:37 AM   Recent Labs Lab 08/19/13 0451  08/21/13 0422 08/22/13 0417 08/23/13 0636  NA 133*  < > 137 143 138  K 5.1  < > 4.2 3.6* 5.9*  CL 96  < > 94* 99 91*   CO2 19  < > 29 32 33*  GLUCOSE 119*  < > 98 91 141*  BUN 96*  < > 82* 73* 71*  CREATININE 3.45*  < > 2.81* 2.79* 2.64*  CALCIUM 9.0  < > 9.6 9.5 10.0  PHOS 5.1*  --   --   --   --   < > = values in this interval not displayed.  Recent Labs Lab 08/19/13 0451 08/23/13 0800  WBC 6.8 11.9*  HGB 8.4* 10.6*  HCT 25.9* 35.9*  MCV 83.0 89.5  PLT PLATELET CLUMPS NOTED ON SMEAR, COUNT APPEARS ADEQUATE 492*

## 2013-08-23 NOTE — Progress Notes (Signed)
Physical Therapy Treatment Patient Details Name: [name] MRN: [id number] DOB: 08/23/1923 Today's Date: 08/23/2013    History of Present Illness 80 year old female with past medical history significant for diastolic CHF, last 2 D ECHO in 2014 showed EF 65%, left BKA, hypothyroidism, CKD stage IV, dyslipidemia who [facility] with worsening shortness of breath started about few days prior to this admission and getting worse in past 24 hours associated with nonproductive cough. Pt reported feeling short of breath with rest and with exertion    PT Comments    Pt progressing towards physical therapy goals. Was able to tolerate 2 attempts at static standing with +2 assist, as well as squat pivot transfer from bed to chair. Pt much more awake and engaged in the therapy session today and appeared to be very motivated to participate. Will continue to follow.   Follow Up Recommendations  SNF;Supervision/Assistance - 24 hour     Equipment Recommendations  None recommended by PT    Recommendations for Other Services Rehab consult     Precautions / Restrictions Precautions Precautions: Fall Restrictions Weight Bearing Restrictions: No    Mobility  Bed Mobility Overal bed mobility: Needs Assistance Bed Mobility: Supine to Sit     Supine to sit: Min guard;HOB elevated     General bed mobility comments: Pt able to transition to EOB with use of bed rails and increased time. Min guard for safety and VC's for sequencing and technique.  Transfers Overall transfer level: Needs assistance Equipment used: 2 person hand held assist Transfers: Sit to/from [employer];Squat Pivot Transfers Sit to Stand: Max assist;+2 physical assistance   Squat pivot transfers: Max assist;+2 physical assistance     General transfer comment: Pt able to perform sit<>stand x2 with R knee blocked. Second attempt pt able to hold for 25 seconds with +2 support. Squat pivot transfer from bed to  recliner towards right side.   Ambulation/Gait                 Stairs            Wheelchair Mobility    Modified Rankin (Stroke Patients Only)       Balance Overall balance assessment: Needs assistance Sitting-balance support: Feet unsupported;Bilateral upper extremity supported Sitting balance-Leahy Scale: Fair Sitting balance - Comments: Feet unable to reach floor due to short stature, however pt able to maintain sitting balance without physical assist.   Standing balance support: Bilateral upper extremity supported Standing balance-Leahy Scale: Zero                      Cognition Arousal/Alertness: Awake/alert Behavior During Therapy: WFL for tasks assessed/performed Overall Cognitive Status: Within Functional Limits for tasks assessed                      Exercises      General Comments        Pertinent Vitals/Pain Vitals stable throughout session.     Home Living                      Prior Function            PT Goals (current goals can now be found in the care plan section) Acute Rehab PT Goals PT Goal Formulation: With patient Potential to Achieve Goals: Good Progress towards PT goals: Progressing toward goals    Frequency  Min 2X/week    PT Plan Current  plan remains appropriate    Co-evaluation PT/OT/SLP Co-Evaluation/Treatment: Yes Reason for Co-Treatment: For patient/therapist safety PT goals addressed during session: Mobility/safety with mobility;Balance;Strengthening/ROM       End of Session Equipment Utilized During Treatment: Gait belt;Oxygen Activity Tolerance: Patient tolerated treatment well Patient left: in chair;with chair alarm set;with call bell/phone within reach     Time: 1110-1133 PT Time Calculation (min): 23 min  Charges:  $Therapeutic Activity: 8-22 mins                    G Codes:      Ruthann Cancer 08-Sep-2013, 12:27 PM  Ruthann Cancer, PT, DPT Acute Rehabilitation  Services Pager: [phone]

## 2013-08-23 NOTE — Discharge Summary (Signed)
Physician Discharge Summary  Shannon SirenRoberta G [name] ZOX:[id number] DOB: 01/22/1924 DOA: 08/15/2013  PCP: Georgianne FickAMACHANDRAN,AJITH, MD  Admit date: 08/15/2013 Discharge date: 08/23/2013  Time spent: >30  minutes  Recommendations for Outpatient Follow-up:  Follow a low sodium diet (less then 2 grams daily) Take medications as prescribed BMET in 1 week to follow electrolytes and renal function Limit fluid intake to 1.5 L in 24 hours Check weight on daily basis (red flag and need to contact cardiology office if patient gained > 3 pounds overnight or > 5 pounds in a week) Reassess BP and adjust medications as needed  Discharge Diagnoses:  Principal Problem:   Acute respiratory failure with hypoxia Active Problems:   CAD - moderate at cath July 2012 (medical Rx)   Myxedema cardiomyopathy, 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 Will be discharge to SNF for rehabilitation.  Diet recommendation: low sodium diet (less than 2 grams daily)  Filed Weights   08/21/13 0501 08/22/13 0554 08/23/13 0447  Weight: 53.8 kg (118 lb 9.7 oz) 52.844 kg (116 lb 8 oz) 52.8 kg (116 lb 6.5 oz)    History of present illness:  80 year old female with past medical history significant for diastolic CHF, last 2 D ECHO in 2014 showed EF 65%, left BKA, hypothyroidism, CKD stage IV, dyslipidemia who presented to [facility] ED 08/15/2013 with worsening shortness of breath started about few days prior to this admission and getting worse in past 24 hours associated with nonproductive cough. Pt reported feeling short of breath with rest and with  exertion. She is on lasix at home and is compliant with meds but reports no significant symptomatic relief. SHe did not have chest pain, palpitations, fevers or chills. No abdominal pain, nausea or vomiting. No reports of diarrhea, blood in stool or urine. No loss of consciousness.  In ED, BP was 127/49, HR 61-70, T max 98.8 F and oxygen saturation 87% on room air. This has improved to 92% with 2 L [place] oxygen support. CXR showed left basilar atelectasis but pulmonary edema. She was given 20 mg IV lasix in ED. Her BNP was in 30,000 range and baseline is around [id number] range.    Hospital Course:  Acute respiratory failure with hypoxia  Likely secondary to acute on chronic diastolic CHF exacerbation  Daily weight and strict intake and low sodium Continue her home meds: coreg, bidil, Norvasc Improved air movement, no wheezing and no crackles Appreciate assistance with diuresing by Nephrology service; will discharge on 200mg  lasix TID Currently with good O2 sat on RA  Acute on chronic diastolic CHF (congestive heart failure)  Continue coreg and bidil Lasix PO 200mg  TID  Cont with daily weight, strict intake and low sodium diet Replete electrolytes as need  2 D ECHO with EF 50-55%  Net neg 6.1L since admission Appreciate input from Cardiology and Nephrology  Patient with good diuresis   CAD - moderate at cath July 2012 (medical Rx)  Continue coreg, statins, aspirin and plavix  No CP or further episodes of SOB  Hypothyroidism  Continue levothyroxine  Peripheral vascular/arterial disease  Status  post left BKA  Continue aspirin and plavix  Needs close follow up given pain in her right foot as well.  Dyslipidemia/HLD  Continue statin therapy   Chronic renal insufficiency, stage IV-V (severe)  Continue Calcitrol, phoslo and renvela  Cr improving/stable with diuresis  Appreciate Nephrology assistance  Diuresis managed by renal; plans is to change lasiX to 200mg  TID PO Follow up with  nephrologist (Dr. Darrick Penna)  Anemia of chronic disease  Secondary to CKD  Hemoglobin stable and no signs of overt bleeding  No current indications for transfusion   Constipation  Will continue daily colace and miralax  Dysphagia  Per SPT rec's ok to continue regular diet and thin liquids. Medications crushed with apple sauce  Supervision and cueing for Compensations: slow rate/amount, small sips/bites and checking for pocketing   Right foot pain  Mild swelling appreciated; patient reports hx of gout in the past. Most likely flare due to aggressive diuresis  Will continue trial with prednisone  Had also hx of PAD, recent arterial dopplers done in outpatient setting; patient actively follow by vascular surgery   Moderate protein calorie malnutrition  Continue feeding supplements as recommended by dietician  Hyperkalemia  Stable and WNL currently  Cont to follow electrolytes   Procedures: See below for x-ray reports   Consultations:  Renal service  Cardiology   Discharge Exam: Filed Vitals:   08/23/13 0758  BP: 145/53  Pulse: 75  Temp: 97.3 F (36.3 C)  Resp: 14   General: Well developed elderly AAF in no acute distress.  Head: Normocephalic, atraumatic, sclera non-icteric, no xanthomas, nares are without discharge.  Neck: JVP not elevated.  Lungs: CTAB. No rales or rhonchi. Breathing is unlabored.  Heart: RRR S1 S2 without murmurs, rubs, or gallops.  Abdomen: Soft, non-tender, non-distended with normoactive bowel sounds. No rebound/guarding.  Extremities: No clubbing or cyanosis. No edema. S/p L BKA.  Neuro: Alert and oriented X 3. Follows commands.  Psych: Responds to questions appropriately with a pleasant but somewhat slowed affect.  Discharge Instructions You were cared for by a hospitalist during your hospital stay. If you have any questions about your discharge medications or the care you received while you were in the hospital after you are discharged,  you can call the unit and asked to speak with the hospitalist on call if the hospitalist that took care of you is not available. Once you are discharged, your primary care physician will handle any further medical issues. Please note that NO REFILLS for any discharge medications will be authorized once you are discharged, as it is imperative that you return to your primary care physician (or establish a relationship with a primary care physician if you do not have one) for your aftercare needs so that they can reassess your need for medications and monitor your lab values.  Discharge Instructions   Diet - low sodium heart healthy    Complete by:  As directed      Discharge instructions    Complete by:  As directed   Follow a low sodium diet (less then 2 grams daily) Take medications as prescribed BMET in 1 week to follow electrolytes and renal function Limit fluid intake to 1.5 L in 24 hours Check weight on daily basis (red flag and need to contact cardiology office if patient gained > 3 pounds overnight or > 5 pounds in a week)            Medication List    STOP taking these  medications       hydrALAZINE 25 MG tablet  Commonly known as:  APRESOLINE     isosorbide mononitrate 60 MG 24 hr tablet  Commonly known as:  IMDUR      TAKE these medications       acetaminophen 325 MG tablet  Commonly known as:  TYLENOL  Take 650 mg by mouth every 6 (six) hours as needed for mild pain.     allopurinol 100 MG tablet  Commonly known as:  ZYLOPRIM  Take 100 mg by mouth daily.     amLODipine 10 MG tablet  Commonly known as:  NORVASC  Take 5 mg by mouth daily.     aspirin EC 81 MG tablet  Take 81 mg by mouth daily.     calcitRIOL 0.25 MCG capsule  Commonly known as:  ROCALTROL  Take 0.25 mcg by mouth daily.     calcium acetate 667 MG capsule  Commonly known as:  PHOSLO  Take 1 capsule (667 mg total) by mouth 3 (three) times daily with meals.     carvedilol 6.25 MG tablet   Commonly known as:  COREG  Take 1 tablet (6.25 mg total) by mouth 2 (two) times daily with a meal.     clopidogrel 75 MG tablet  Commonly known as:  PLAVIX  Take 75 mg by mouth daily.     DSS 100 MG Caps  Take 100 mg by mouth 2 (two) times daily.     feeding supplement (GLUCERNA SHAKE) Liqd  Take 237 mLs by mouth daily as needed (PLease offer to pt if meal intake is less than 50%).     furosemide 80 MG tablet  Commonly known as:  LASIX  Take 2.5 tablets (200 mg total) by mouth 3 (three) times daily.     gabapentin 300 MG capsule  Commonly known as:  NEURONTIN  Take 300 mg by mouth 3 (three) times daily.     HYDROcodone-acetaminophen 5-325 MG per tablet  Commonly known as:  NORCO/VICODIN  Take 1 tablet by mouth every 12 (twelve) hours as needed for severe pain.     insulin glargine 100 UNIT/ML injection  Commonly known as:  LANTUS  Inject 0.22 mLs (22 Units total) into the skin daily.     ipratropium-albuterol 0.5-2.5 (3) MG/3ML Soln  Commonly known as:  DUONEB  Take 3 mLs by nebulization 4 (four) times daily.     isosorbide-hydrALAZINE 20-37.5 MG per tablet  Commonly known as:  BIDIL  Take 1 tablet by mouth 2 (two) times daily.     levothyroxine 75 MCG tablet  Commonly known as:  SYNTHROID, LEVOTHROID  Take 75 mcg by mouth daily before breakfast.     multivitamin Tabs tablet  Take 1 tablet by mouth at bedtime.     nitroGLYCERIN 0.4 MG SL tablet  Commonly known as:  NITROSTAT  Place 0.4 mg under the tongue every 5 (five) minutes as needed for chest pain.     nortriptyline 50 MG capsule  Commonly known as:  PAMELOR  Take 50 mg by mouth daily.     ONGLYZA 5 MG Tabs tablet  Generic drug:  saxagliptin HCl  Take 5 mg by mouth daily.     polyethylene glycol packet  Commonly known as:  MIRALAX / GLYCOLAX  Take 17 g by mouth daily.     pravastatin 40 MG tablet  Commonly known as:  PRAVACHOL  Take 40 mg by mouth daily.     predniSONE 20  MG tablet  Commonly  known as:  DELTASONE  Take 1 tablet (20 mg total) by mouth daily with breakfast. For 3 more days and stop prednisone     RENVELA 800 MG tablet  Generic drug:  sevelamer carbonate  Take 800 mg by mouth 3 (three) times daily with meals.     travoprost (benzalkonium) 0.004 % ophthalmic solution  Commonly known as:  TRAVATAN  Place 1 drop into both eyes at bedtime.       Allergies  Allergen Reactions  . Aspirin Nausea And Vomiting    325 mg (adult strength) [name]        Follow-up Information   Follow up with DETERDING,JAMES L, MD. (Appt being made and you will be called)    Specialty:  Nephrology   Contact information:   [address] [place] [address] [phone]       The results of significant diagnostics from this hospitalization (including imaging, microbiology, ancillary and laboratory) are listed below for reference.    Significant Diagnostic Studies: Dg Chest Port 1 View  08/19/2013   CLINICAL DATA:  Shortness of breath.  EXAM: PORTABLE CHEST - 1 VIEW  COMPARISON:  08/15/2013  FINDINGS: Cardiomegaly. Vascular congestion with increasing interstitial prominence and bibasilar opacities. Favor edema/CHF. Bibasilar opacities could also reflect atelectasis. Small bilateral pleural effusions. No acute bony abnormality.  IMPRESSION: Increasing vascular congestion and probable mild interstitial edema/ CHF.  Small bilateral effusions.  Bibasilar edema or atelectasis.   Electronically Signed   By: Charlett Nose M.D.   On: 08/19/2013 10:17   Dg Chest Port 1 View  08/15/2013   CLINICAL DATA:  Short of breath  EXAM: PORTABLE CHEST - 1 VIEW  COMPARISON:  Radiograph 06/17/2013  FINDINGS: Normal mediastinum and cardiac silhouette. Normal pulmonary vasculature. No effusion, infiltrate, pneumothorax. Mild left basilar atelectasis.  IMPRESSION: Mild left basilar atelectasis.  No pulmonary edema.   Electronically Signed   By:  Genevive Bi M.D.   On: 08/15/2013 15:48   Labs: Basic Metabolic Panel:  Recent Labs Lab 08/19/13 0451 08/20/13 0532 08/21/13 0422 08/22/13 0417 08/23/13 0636 08/23/13 1008  NA 133* 140 137 143 138 139  K 5.1 4.6 4.2 3.6* 5.9* 5.3  CL 96 98 94* 99 91* 94*  CO2 19 26 29  32 33* 32  GLUCOSE 119* 40* 98 91 141* 148*  BUN 96* 90* 82* 73* 71* 70*  CREATININE 3.45* 3.19* 2.81* 2.79* 2.64* 2.62*  CALCIUM 9.0 9.9 9.6 9.5 10.0 9.9  PHOS 5.1*  --   --   --   --   --    Liver Function Tests:  Recent Labs Lab 08/19/13 0451  AST 19  ALT 14  ALKPHOS 76  BILITOT 0.3  PROT 6.3  ALBUMIN 2.5*   CBC:  Recent Labs Lab 08/19/13 0451 08/23/13 0800  WBC 6.8 11.9*  HGB 8.4* 10.6*  HCT 25.9* 35.9*  MCV 83.0 89.5  PLT PLATELET CLUMPS NOTED ON SMEAR, COUNT APPEARS ADEQUATE 492*   BNP (last 3 results)  Recent Labs  06/10/13 1645 08/15/13 1424 08/15/13 2015  PROBNP 1297.00* 30760.0* 31926.0*   CBG:  Recent Labs Lab 08/22/13 0739 08/22/13 1339 08/22/13 1629 08/22/13 2050 08/23/13 0556  GLUCAP 93 160* 225* 222* 162*    Signed:  [name]  [facility] 08/23/2013, 12:07 PM

## 2013-08-23 NOTE — Progress Notes (Addendum)
I have seen and evaluated the patient this PM along with Ronie Spies , PA. I agree with her findings, examination as well as impression recommendations.  Seems to be doing much better & still diuresing on PO diuretics -- at this point, we do not have much more to offer  BP & HR stable -- will defer to Nephrology re: dosing of Lasix on d/c.  Will need VascSgx f/u for PVD - ischemic foot.  I suspect that she is nearing discharge to SNF  Will sign off.  Please call with ?s. With Nephrology following for diuretic dosing & HTN, I am not sure that she will need Cardiology f/u.   Marykay Lex, M.D., M.S. Interventional Cardiologist   Pager # [phone] 08/23/2013

## 2013-09-03 ENCOUNTER — Encounter: Payer: Self-pay | Admitting: Vascular Surgery

## 2013-09-04 ENCOUNTER — Encounter: Payer: Self-pay | Admitting: Vascular Surgery

## 2013-09-04 ENCOUNTER — Ambulatory Visit (INDEPENDENT_AMBULATORY_CARE_PROVIDER_SITE_OTHER): Payer: Medicare Other | Admitting: Vascular Surgery

## 2013-09-04 VITALS — BP 107/54 | HR 54 | Ht <= 58 in | Wt 116.0 lb

## 2013-09-04 DIAGNOSIS — N186 End stage renal disease: Secondary | ICD-10-CM

## 2013-09-04 DIAGNOSIS — M79673 Pain in unspecified foot: Secondary | ICD-10-CM | POA: Insufficient documentation

## 2013-09-04 DIAGNOSIS — I739 Peripheral vascular disease, unspecified: Secondary | ICD-10-CM

## 2013-09-04 DIAGNOSIS — N184 Chronic kidney disease, stage 4 (severe): Secondary | ICD-10-CM | POA: Insufficient documentation

## 2013-09-04 NOTE — Assessment & Plan Note (Signed)
This patient has evidence of infrainguinal arterial occlusive disease on the right with rest pain. She is 80 years old, and has diabetes and chronic kidney disease. In addition she has coronary artery disease. She is nonambulatory and is status post left below the knee amputation. I would not recommend an aggressive approach to revascularization on the right for all of these reasons. I have explained to the patient and her family, that the risk of arteriography is that we could certainly put her on dialysis. She really doesn't have any good options for dialysis as she has no femoral pulse on the left on the side of her BKA, and given her severe infrainguinal arterial occlusive disease on the right I would not want to place a right thigh graft. We could consider using CO2 but I do not think this would provide adequate visualization to determine what her options might be.   Thus the options are continued conservative treatment if her symptoms are tolerable versus primary amputation on the right, versus proceeding with arteriography and understanding that this would likely put her on dialysis. She will consider these options and let us know if she decides to proceed in any specific direction. Otherwise I'll plan on seeing her back in 2 months.

## 2013-09-04 NOTE — Progress Notes (Signed)
[facility] [place]  Patient name: [name] Luster MRN: [id number] DOB: 10/28/1923 Sex: female  REASON FOR VISIT: Peripheral vascular disease and chronic kidney disease  HPI: [name] Samet is a 80 y.o. female who was last seen in our office on 08/02/2013 by Charisse MarchSuzanne Nickel, NP.  She has had a previous left below the knee amputation. She is nonambulatory and has not had a prosthesis for her left BKA. She was seen in the office on 08/02/2013 with worsening right foot pain. Duplex scan at that time showed monophasic waveforms in the right common femoral artery through all of her tibial vessels distally. She has calcified vessels so ABIs were not obtained. She was told to come back in 3 weeks and follow up with me.  She continues to have rest pain in her right foot but currently has no open ulcers. She leaves her leg pain down mostly. It is difficult to obtain history from her as she has an expressive aphasia.  She has a left basilic vein transposition that was placed on 05/27/2011. She is not on dialysis. The fistula did not mature adequately.  Past Medical History  Diagnosis Date  . Coronary artery disease     a. Cath 09/2010 - med rx.  Marland Kitchen. TIA (transient ischemic attack)   . Hypertension   . Shingles   . Stroke   . Anemia   . Cardiomyopathy, idiopathic 02/08/2011  . Myocardial infarct     x 3 unsure of years  . Irregular heartbeat   . Ulcer   . GERD (gastroesophageal reflux disease)   . Hx of transient ischemic attack (TIA)   . Memory loss   . Chronic diastolic CHF (congestive heart failure)     Takes Lasix  . Gout     takes allopurinol  . Peripheral vascular disease     a. s/p L BKA.  . Diabetes mellitus     type 2 NIDDM  . Chronic kidney disease (CKD), stage IV (severe)   . Arthritis   . Hypothyroidism     (SEVERE) Takes Levothryroxine  . Nonischemic cardiomyopathy     EF now is 55%, reduced due to myxedema, which is improved.  . Dyslipidemia   .  Peripheral neuropathy   . H/O echocardiogram 09/06/11    Indication- nonIschemic Cardiomyopathy. EF = now greater than 55% with no regional wall motion abnormalities. Tthere is mild to moderate trisuspid regurgitayion and mild pulmonary hypertension with an RVSP of 35 mmHg as well as stage 1 diastolic dysfunction and mild to moderate LVH.  Marland Kitchen. Abnormal nuclear stress test 06/01/09    Demonstrated a new area of infarct scar, peri-infarct ischemia seen in the inferolateral territory. EF eas normal at 70% with mild hypocontractility at the apex, distal inferolateral wall.   SOCIAL HISTORY: History  Substance Use Topics  . Smoking status: Former Smoker -- 0.50 packs/day for 40 years    Quit date: 07/05/1986  . Smokeless tobacco: Former NeurosurgeonUser     Comment: quit smoking 1988  . Alcohol Use: No   Allergies  Allergen Reactions  . Aspirin Nausea And Vomiting    325 mg (adult strength) [name]    REVIEW OF SYSTEMS: [name][X ] denotes positive finding; [  ] denotes negative finding  CARDIOVASCULAR:  [ ]  chest pain   [ ]  chest pressure   [ ]  palpitations   [ ]  orthopnea   [ ]   dyspnea on exertion   [ ]  claudication   [name] ] rest pain   [ ]  DVT   [ ]  phlebitis PULMONARY:   [ ]  productive cough   [ ]  asthma   [ ]  wheezing INTEGUMENTARY:  [ ]  rashes  [ ]  ulcers CONSTITUTIONAL:  [ ]  fever   [ ]  chills  PHYSICAL EXAM: Filed Vitals:   09/04/13 1533  BP: 107/54  Pulse: 54  Height: 4\' 9"  (1.448 m)  Weight: 116 lb (52.617 kg)  SpO2: 100%   Body mass index is 25.1 kg/(m^2). GENERAL: The patient is a well-nourished female, in no acute distress. The vital signs are documented above. CARDIOVASCULAR: There is a regular rate and rhythm. She does have a palpable right femoral pulse. I cannot palpate a left femoral pulse on the side of her BKA. PULMONARY: There is good air exchange bilaterally without wheezing or rales. ABDOMEN: Soft and non-tender with  normal pitched bowel sounds.  PSYCHIATRIC: The patient has a normal affect. She has a very small crack on her right heel but no drainage or erythema.  DATA:  I have reviewed her previous duplex study which shows monophasic Doppler signals throughout the entire right lower extremity. She had a monophasic posterior tibial, dorsalis pedis, and peroneal signal with the Doppler. ABIs could not be obtained because of her calcific disease.  MEDICAL ISSUES:  Peripheral vascular disease, hx Lt BKA, known RLE disease This patient has evidence of infrainguinal arterial occlusive disease on the right with rest pain. She is 80 years old, and has diabetes and chronic kidney disease. In addition she has coronary artery disease. She is nonambulatory and is status post left below the knee amputation. I would not recommend an aggressive approach to revascularization on the right for all of these reasons. I have explained to the patient and her family, that the risk of arteriography is that we could certainly put her on dialysis. She really doesn't have any good options for dialysis as she has no femoral pulse on the left on the side of her BKA, and given her severe infrainguinal arterial occlusive disease on the right I would not want to place a right thigh graft. We could consider using CO2 but I do not think this would provide adequate visualization to determine what her options might be.   Thus the options are continued conservative treatment if her symptoms are tolerable versus primary amputation on the right, versus proceeding with arteriography and understanding that this would likely put her on dialysis. She will consider these options and let us know if she decides to proceed in any specific direction. Otherwise I'll plan on seeing her back in 2 months.    Return in about 2 months (around 11/04/2013).  DICKSON,CHRISTOPHER S [facility]

## 2013-09-05 ENCOUNTER — Telehealth: Payer: Self-pay | Admitting: Internal Medicine

## 2013-09-05 DIAGNOSIS — Z79899 Other long term (current) drug therapy: Secondary | ICD-10-CM

## 2013-09-05 NOTE — Telephone Encounter (Signed)
Nurse states there is an order that was placed by Dr. Lavell Anchors weigh patient daily and if weight increased by more than 2 lbs to call the office.  This patient has been gaining weight daily and has gone from 112 to 121.8.  The nurse has had a BMP drawn but does not have those results yet.  Please call and advise.

## 2013-09-05 NOTE — Telephone Encounter (Signed)
I called the nursing home and tried to talk to the nurse on taking care of this pt. But after almost 20 minutes and four staff members attempting to get that nurse to come to the phone, i decided to try again tomarrow to resolve this issue

## 2013-09-06 NOTE — Telephone Encounter (Signed)
Her BUN and SCr suggest she is dry- I can't explain her wgt gain but if she is no more SOB than usual I would not change her meds. Please have Dr Rennis Golden review as well when he returns. She needs a follow up BMP next week.  Corine Shelter PA-C 09/06/2013 4:39 PM

## 2013-09-06 NOTE — Telephone Encounter (Signed)
RN returned call to patient's nurse at [facility], Bjorn Loser.   Reports patient has been progressively been gaining weight 112-121lbs over 10-12 days.  Reports patient has good appetite, lungs are clear, no respiratory issues, no edema. Patient is alert.   Nurse states Dr. Rennis Golden gave order to call if patient gained >3lbs in 1 day or >5lbs in 1 week.   Patient had recent BMET - which this RN requested be faxed to office for review.

## 2013-09-06 NOTE — Telephone Encounter (Signed)
RN attempted call provided number for charge nurse - no answer

## 2013-09-06 NOTE — Telephone Encounter (Signed)
RN called [name], Nurse caring for patient, requesting labs. She reports that NP has labs and she is trying to get the results from her to fax to our office.

## 2013-09-06 NOTE — Telephone Encounter (Signed)
Faxed labs received.   Na 131 K 3.7 BUN 104 Creat 3.66 Ca++ 10.8 Ch 89  Will defer to [place], [place] to advise on patient's weight gain.

## 2013-09-06 NOTE — Telephone Encounter (Signed)
Spoke with Rhona, nurse - informed her for advice/info per [facility]. Repeat BMET order faxed to [facility] to be done in 1 week and resulted to be faxed to Dr. Rennis Golden for review.   Faxed to [phone]

## 2013-09-18 ENCOUNTER — Telehealth: Payer: Self-pay | Admitting: *Deleted

## 2013-09-18 NOTE — Telephone Encounter (Signed)
RN attempted to contact nurse at [facility] caring for patient. Was informed patient is not at this facility.   RN left VM on contact number for patient asking she call back regarding labs received from [facility].  Per Dr. Rennis Golden - patient should see nephrologist.   Of note.. [facility] was supposed to do another BMET and fax to office.   RN spoke with patient's son Mathis Fare. Patient had appmt with Dr. Darrick Penna 6/29

## 2013-11-05 ENCOUNTER — Other Ambulatory Visit: Payer: Self-pay | Admitting: Internal Medicine

## 2013-11-05 ENCOUNTER — Encounter: Payer: Self-pay | Admitting: Vascular Surgery

## 2013-11-05 NOTE — Telephone Encounter (Signed)
Rx was sent to pharmacy electronically. 

## 2013-11-06 ENCOUNTER — Encounter: Payer: Self-pay | Admitting: Vascular Surgery

## 2013-11-06 ENCOUNTER — Ambulatory Visit (INDEPENDENT_AMBULATORY_CARE_PROVIDER_SITE_OTHER): Payer: Medicare Other | Admitting: Vascular Surgery

## 2013-11-06 VITALS — BP 164/55 | HR 69 | Ht <= 58 in | Wt 116.0 lb

## 2013-11-06 DIAGNOSIS — N186 End stage renal disease: Secondary | ICD-10-CM

## 2013-11-06 DIAGNOSIS — Z01810 Encounter for preprocedural cardiovascular examination: Secondary | ICD-10-CM

## 2013-11-06 NOTE — Progress Notes (Signed)
     [name] is a 80 y.o. female who was last seen in our office on 09/04/2013 by Dr. Edilia [name].  She has a history of PAD with monophasic wave forms.  Her medial right foot has healed wounds.  She currently has no open wounds on her right LE.  She has rest pain, but states she can tolerate it at this time.       Objective 164/55 69     98%  Heart RRR Lungs CTA Left upper arm AV fistula no palpable thrill Right UE radial and brachial palpable pulses Right LE warm No openings in the skin, well healed medial wounds   Assessment/Planning: Peripheral vascular disease, hx Lt BKA, known RLE disease Her Left foot is healed She will check her skin daily and keep it well lubricated She will F/U in early Sept for right UE vein mapping to plan for future access as needed per [facility] Dr. Darrick [name].   She was seen today in clinic by Dr. Edilia [name].  [name] [name]      [name], [name] [facility] 11/06/2013 4:07 PM --  Laboratory Lab Results: No results found for this basename: WBC, HGB, HCT, PLT,  in the last 72 hours BMET No results found for this basename: NA, K, CL, CO2, GLUCOSE, BUN, CREATININE, CALCIUM,  in the last 72 hours  COAG Lab Results  Component Value Date   INR 1.01 02/06/2012   INR 1.05 10/04/2010   INR 1.1 09/28/2007   No results found for this basename: PTT

## 2013-11-26 ENCOUNTER — Encounter: Payer: Self-pay | Admitting: Vascular Surgery

## 2013-11-27 ENCOUNTER — Ambulatory Visit: Payer: Medicare Other | Admitting: Vascular Surgery

## 2013-11-27 ENCOUNTER — Encounter (HOSPITAL_COMMUNITY): Payer: Medicare Other

## 2013-12-06 ENCOUNTER — Telehealth: Payer: Self-pay | Admitting: *Deleted

## 2013-12-06 NOTE — Telephone Encounter (Signed)
Mrs. Luis daughter called to report that patient is having a lot more phantom pain in her Left BKA stump x 24 hours. She is afebrile, no signs of infection, no erythema or drainage from either her stump or right leg/foot.  Daughter is requesting a refill of her Hydrocodone specifically and I told her that the patient would have to come in to be evaluated because of her increase pain over the past day. Daughter will discuss with the family and call us back.

## 2013-12-12 ENCOUNTER — Encounter: Payer: Self-pay | Admitting: Vascular Surgery

## 2013-12-13 ENCOUNTER — Encounter: Payer: Self-pay | Admitting: Vascular Surgery

## 2013-12-13 ENCOUNTER — Ambulatory Visit (HOSPITAL_COMMUNITY)
Admission: RE | Admit: 2013-12-13 | Discharge: 2013-12-13 | Disposition: A | Discharge status: Home / Self Care | Payer: Medicare Other | Source: Ambulatory Visit | Attending: Vascular Surgery | Admitting: Vascular Surgery

## 2013-12-13 ENCOUNTER — Ambulatory Visit (INDEPENDENT_AMBULATORY_CARE_PROVIDER_SITE_OTHER): Payer: Medicare Other | Admitting: Vascular Surgery

## 2013-12-13 VITALS — BP 198/78 | HR 80 | Resp 20 | Wt 127.0 lb

## 2013-12-13 DIAGNOSIS — Z01810 Encounter for preprocedural cardiovascular examination: Secondary | ICD-10-CM | POA: Diagnosis present

## 2013-12-13 DIAGNOSIS — N186 End stage renal disease: Secondary | ICD-10-CM | POA: Diagnosis present

## 2013-12-13 NOTE — Progress Notes (Signed)
[facility] [place]  Patient name: [name] MRN: [id number] DOB: 02-02-1924 Sex: female  REASON FOR VISIT: Evaluate for new hemodialysis access.  HPI: BRIGGETTE [name] is a 80 y.o. female who I last saw on 09/04/2013. She has had a previous left below the knee amputation. She is nonambulatory. She has known infrainguinal arterial occlusive disease on the right. She is not yet on dialysis. I have reviewed her records and her GFR is 15. She did have a left basilic vein transposition in March of 2013 but ultimately this failed. She denies any recent uremic symptoms. Specifically she denies nausea, vomiting, fatigue, anorexia, or palpitations.  Past Medical History  Diagnosis Date  . Coronary artery disease     a. Cath 09/2010 - med rx.  Marland Kitchen TIA (transient ischemic attack)   . Hypertension   . Shingles   . Stroke   . Anemia   . Cardiomyopathy, idiopathic 02/08/2011  . Myocardial infarct     x 3 unsure of years  . Irregular heartbeat   . Ulcer   . GERD (gastroesophageal reflux disease)   . Hx of transient ischemic attack (TIA)   . Memory loss   . Chronic diastolic CHF (congestive heart failure)     Takes Lasix  . Gout     takes allopurinol  . Peripheral vascular disease     a. s/p L BKA.  . Diabetes mellitus     type 2 NIDDM  . Chronic kidney disease (CKD), stage IV (severe)   . Arthritis   . Hypothyroidism     (SEVERE) Takes Levothryroxine  . Nonischemic cardiomyopathy     EF now is 55%, reduced due to myxedema, which is improved.  . Dyslipidemia   . Peripheral neuropathy   . H/O echocardiogram 09/06/11    Indication- nonIschemic Cardiomyopathy. EF = now greater than 55% with no regional wall motion abnormalities. Tthere is mild to moderate trisuspid regurgitayion and mild pulmonary hypertension with an RVSP of 35 mmHg as well as stage 1 diastolic dysfunction and mild to moderate LVH.  Marland Kitchen Abnormal nuclear stress test 06/01/09    Demonstrated a new area  of infarct scar, peri-infarct ischemia seen in the inferolateral territory. EF eas normal at 70% with mild hypocontractility at the apex, distal inferolateral wall.   Family History  Problem Relation Age of Onset  . Cancer Sister     STOMACH  . Diabetes Sister   . Cancer Brother     BONE  . Diabetes Brother   . Anesthesia problems Neg Hx   . Hypotension Neg Hx   . Malignant hyperthermia Neg Hx   . Pseudochol deficiency Neg Hx   . Hyperlipidemia Daughter   . Hypertension Daughter   . Heart disease Daughter     before age 43  . Kidney disease Daughter   . Other Daughter     varicose veins  . Diabetes Daughter   . Heart disease Son     before age 75  . Hyperlipidemia Son   . Hypertension Son   . Heart attack Son   . Heart attack Daughter   . Heart disease Daughter     Before age 46   SOCIAL HISTORY: History  Substance Use Topics  . Smoking status: Former Smoker -- 0.50 packs/day for 40 years    Quit date: 07/05/1986  . Smokeless tobacco: Former Neurosurgeon     Comment: quit smoking 1988  . Alcohol Use: No   Allergies  Allergen Reactions  . Aspirin Nausea And Vomiting    325 mg (adult strength) [name]    Current Outpatient Prescriptions  Medication Sig Dispense Refill  . acetaminophen (TYLENOL) 325 MG tablet Take 650 mg by mouth every 6 (six) hours as needed for mild pain.      Marland Kitchen. allopurinol (ZYLOPRIM) 100 MG tablet Take 100 mg by mouth daily.       Marland Kitchen. amLODipine (NORVASC) 10 MG tablet Take 5 mg by mouth daily.       Marland Kitchen. aspirin EC 81 MG tablet Take 81 mg by mouth daily.      . calcitRIOL (ROCALTROL) 0.25 MCG capsule Take 0.25 mcg by mouth daily.       . calcium acetate (PHOSLO) 667 MG capsule Take 1 capsule (667 mg total) by mouth 3 (three) times daily with meals.      . carvedilol (COREG) 6.25 MG tablet TAKE 1 TABLET (6.25 MG TOTAL) BY MOUTH 2 (TWO) TIMES DAILY WITH A MEAL.  60 tablet  7  . clopidogrel (PLAVIX)  75 MG tablet Take 75 mg by mouth daily.      Marland Kitchen. docusate sodium 100 MG CAPS Take 100 mg by mouth 2 (two) times daily.  10 capsule  0  . feeding supplement, GLUCERNA SHAKE, (GLUCERNA SHAKE) LIQD Take 237 mLs by mouth daily as needed (PLease offer to pt if meal intake is less than 50%).    0  . ferrous sulfate (MEIJER FERROUS SULFATE) 325 (65 FE) MG tablet Take 325 mg by mouth daily with breakfast.      . furosemide (LASIX) 80 MG tablet Take 2.5 tablets (200 mg total) by mouth 3 (three) times daily.      Marland Kitchen. gabapentin (NEURONTIN) 300 MG capsule Take 300 mg by mouth 3 (three) times daily.      Marland Kitchen. HYDROcodone-acetaminophen (NORCO/VICODIN) 5-325 MG per tablet Take 1 tablet by mouth every 12 (twelve) hours as needed for severe pain.  30 tablet  0  . insulin glargine (LANTUS) 100 UNIT/ML injection Inject 0.22 mLs (22 Units total) into the skin daily.  10 mL  11  . ipratropium-albuterol (DUONEB) 0.5-2.5 (3) MG/3ML SOLN Take 3 mLs by nebulization 4 (four) times daily.  360 mL    . isosorbide-hydrALAZINE (BIDIL) 20-37.5 MG per tablet Take 1 tablet by mouth 2 (two) times daily.      Marland Kitchen. levothyroxine (SYNTHROID, LEVOTHROID) 75 MCG tablet Take 75 mcg by mouth daily before breakfast.      . multivitamin (RENA-VIT) TABS tablet Take 1 tablet by mouth at bedtime.    0  . nitroGLYCERIN (NITROSTAT) 0.4 MG SL tablet Place 0.4 mg under the tongue every 5 (five) minutes as needed for chest pain.       . nortriptyline (PAMELOR) 50 MG capsule Take 50 mg by mouth daily.       . ONGLYZA 5 MG TABS tablet Take 5 mg by mouth daily.       . polyethylene glycol (MIRALAX / GLYCOLAX) packet Take 17 g by mouth daily.  14 each  0  . pravastatin (PRAVACHOL) 40 MG tablet Take 40 mg by mouth daily.      . travoprost, benzalkonium, (TRAVATAN) 0.004 % ophthalmic solution Place 1 drop into both eyes at bedtime.       . predniSONE (DELTASONE) 20 MG tablet Take 1 tablet (20 mg total) by mouth daily with breakfast. For 3 more days and  stop  prednisone      . RENVELA 800 MG tablet Take 800 mg by mouth 3 (three) times daily with meals.        No current facility-administered medications for this visit.   REVIEW OF SYSTEMS: [name] ] denotes positive finding; [  ] denotes negative finding  CARDIOVASCULAR:   chest pain    chest pressure    palpitations    orthopnea    dyspnea on exertion    claudication    rest pain    DVT    phlebitis PULMONARY:    productive cough    asthma    wheezing NEUROLOGIC:    weakness   paresthesias   aphasia   amaurosis   dizziness HEMATOLOGIC:    bleeding problems    clotting disorders MUSCULOSKELETAL:   joint pain    joint swelling  leg swelling GASTROINTESTINAL:   blood in stool    hematemesis GENITOURINARY:    dysuria    hematuria PSYCHIATRIC:   history of major depression INTEGUMENTARY:   rashes   ulcers CONSTITUTIONAL:   fever    chills  PHYSICAL EXAM: Filed Vitals:   12/13/13 1421  BP: 198/78  Pulse: 80  Resp: 20  Weight: 127 lb (57.607 kg)   Cannot calculate BMI with a height equal to zero. GENERAL: The patient is a well-nourished female, in no acute distress. The vital signs are documented above. CARDIOVASCULAR: There is a regular rate and rhythm. She has a normal right radial and brachial pulse. She has a slightly diminished left radial pulse. PULMONARY: There is good air exchange bilaterally without wheezing or rales. ABDOMEN: Soft and non-tender with normal pitched bowel sounds.  MUSCULOSKELETAL: She has a left below the knee amputation. NEUROLOGIC: No focal weakness or paresthesias are detected. SKIN: There are no ulcers or rashes noted. PSYCHIATRIC: The patient has a normal affect.  DATA:  I have independently interpreted her vein mapping on the right which shows that her forearm and upper arm cephalic vein in the right are very small, less than 0.2 cm. The basilic vein on the right is short  with diameters ranging from 0.24 cm-0.31 cm.  MEDICAL ISSUES:  End stage renal disease Given that she has a stronger radial pulse on the right I have recommended placement of access in the right upper extremity. If the basilic vein appears to be adequate then we would do a basilic vein transposition. Likewise I will look at the cephalic vein myself at the time of surgery. Certainly we will try a fistula if at all possible. Otherwise we'll plan on placement of an AV graft, given that her GFR is 15. Her blood pressure and the office today with significantly elevated however she tells me that she did not take her medications this morning. I instructed her to be sure that she was taking her medications as of her blood pressure was too high when she presented for surgery she could potentially get canceled. I have explained the indications for placement of an AV fistula or AV graft. I've explained that if at all possible we will place an AV fistula.  I have reviewed the risks of placement of an AV fistula including but not limited to: failure of the fistula to mature, need for subsequent interventions, and thrombosis. In addition I have  reviewed the potential complications of placement of an AV graft. These risks include, but are not limited to, graft thrombosis, graft infection, wound healing problems, bleeding, arm swelling, and steal syndrome. All the patient's questions were answered and they are agreeable to proceed with surgery. Her surgery is scheduled for 12/24/2013.    Mackenzy Grumbine S [facility] [place] Beeper: [phone]

## 2013-12-13 NOTE — Assessment & Plan Note (Signed)
Given that she has a stronger radial pulse on the right I have recommended placement of access in the right upper extremity. If the basilic vein appears to be adequate then we would do a basilic vein transposition. Likewise I will look at the cephalic vein myself at the time of surgery. Certainly we will try a fistula if at all possible. Otherwise we'll plan on placement of an AV graft, given that her GFR is 15. Her blood pressure and the office today with significantly elevated however she tells me that she did not take her medications this morning. I instructed her to be sure that she was taking her medications as of her blood pressure was too high when she presented for surgery she could potentially get canceled. I have explained the indications for placement of an AV fistula or AV graft. I've explained that if at all possible we will place an AV fistula.  I have reviewed the risks of placement of an AV fistula including but not limited to: failure of the fistula to mature, need for subsequent interventions, and thrombosis. In addition I have reviewed the potential complications of placement of an AV graft. These risks include, but are not limited to, graft thrombosis, graft infection, wound healing problems, bleeding, arm swelling, and steal syndrome. All the patient's questions were answered and they are agreeable to proceed with surgery. Her surgery is scheduled for 12/24/2013.

## 2013-12-16 ENCOUNTER — Other Ambulatory Visit: Payer: Self-pay | Admitting: *Deleted

## 2013-12-16 MED ORDER — CARVEDILOL 6.25 MG PO TABS
ORAL_TABLET | ORAL | Status: DC
Start: 2013-12-16 — End: 2013-12-23

## 2013-12-16 MED ORDER — FUROSEMIDE 80 MG PO TABS: 80.0000 mg | ORAL_TABLET | Freq: Three times a day (TID) | ORAL | Status: DC

## 2013-12-17 ENCOUNTER — Other Ambulatory Visit: Payer: Self-pay

## 2013-12-17 MED ORDER — FUROSEMIDE 80 MG PO TABS: 200.0000 mg | ORAL_TABLET | Freq: Three times a day (TID) | ORAL | Status: DC

## 2013-12-17 NOTE — Telephone Encounter (Signed)
Rx was sent to pharmacy electronically. 

## 2013-12-23 ENCOUNTER — Other Ambulatory Visit: Payer: Self-pay

## 2013-12-23 ENCOUNTER — Other Ambulatory Visit (HOSPITAL_COMMUNITY): Payer: Self-pay | Admitting: *Deleted

## 2013-12-23 ENCOUNTER — Encounter (HOSPITAL_COMMUNITY): Payer: Self-pay | Admitting: *Deleted

## 2013-12-23 ENCOUNTER — Encounter (HOSPITAL_COMMUNITY): Payer: Self-pay | Admitting: Pharmacy Technician

## 2013-12-23 MED ORDER — DEXTROSE 5 % IV SOLN
1.5000 g | INTRAVENOUS | Status: AC
  Administered 2013-12-24: 1.5 g via INTRAVENOUS
  Filled 2013-12-23 (×2): qty 1.5

## 2013-12-24 ENCOUNTER — Encounter (HOSPITAL_COMMUNITY)
Admission: RE | Disposition: A | Discharge status: Home / Self Care | Payer: Self-pay | Source: Ambulatory Visit | Attending: Vascular Surgery

## 2013-12-24 ENCOUNTER — Ambulatory Visit (HOSPITAL_COMMUNITY)
Admission: RE | Admit: 2013-12-24 | Discharge: 2013-12-24 | Disposition: A | Discharge status: Home / Self Care | Payer: Medicare Other | Source: Ambulatory Visit | Attending: Vascular Surgery | Admitting: Vascular Surgery

## 2013-12-24 ENCOUNTER — Encounter (HOSPITAL_COMMUNITY): Payer: Medicare Other | Admitting: Anesthesiology

## 2013-12-24 ENCOUNTER — Encounter (HOSPITAL_COMMUNITY): Payer: Self-pay | Admitting: *Deleted

## 2013-12-24 ENCOUNTER — Ambulatory Visit (HOSPITAL_COMMUNITY): Payer: Medicare Other | Admitting: Anesthesiology

## 2013-12-24 DIAGNOSIS — I2510 Atherosclerotic heart disease of native coronary artery without angina pectoris: Secondary | ICD-10-CM | POA: Diagnosis not present

## 2013-12-24 DIAGNOSIS — N186 End stage renal disease: Secondary | ICD-10-CM | POA: Diagnosis present

## 2013-12-24 DIAGNOSIS — G629 Polyneuropathy, unspecified: Secondary | ICD-10-CM | POA: Diagnosis not present

## 2013-12-24 DIAGNOSIS — I120 Hypertensive chronic kidney disease with stage 5 chronic kidney disease or end stage renal disease: Secondary | ICD-10-CM | POA: Insufficient documentation

## 2013-12-24 DIAGNOSIS — N185 Chronic kidney disease, stage 5: Secondary | ICD-10-CM

## 2013-12-24 DIAGNOSIS — E039 Hypothyroidism, unspecified: Secondary | ICD-10-CM | POA: Insufficient documentation

## 2013-12-24 DIAGNOSIS — Z886 Allergy status to analgesic agent status: Secondary | ICD-10-CM | POA: Diagnosis not present

## 2013-12-24 DIAGNOSIS — I252 Old myocardial infarction: Secondary | ICD-10-CM | POA: Diagnosis not present

## 2013-12-24 DIAGNOSIS — I429 Cardiomyopathy, unspecified: Secondary | ICD-10-CM | POA: Insufficient documentation

## 2013-12-24 DIAGNOSIS — E785 Hyperlipidemia, unspecified: Secondary | ICD-10-CM | POA: Insufficient documentation

## 2013-12-24 DIAGNOSIS — E119 Type 2 diabetes mellitus without complications: Secondary | ICD-10-CM | POA: Diagnosis not present

## 2013-12-24 DIAGNOSIS — M109 Gout, unspecified: Secondary | ICD-10-CM | POA: Diagnosis not present

## 2013-12-24 DIAGNOSIS — I5032 Chronic diastolic (congestive) heart failure: Secondary | ICD-10-CM | POA: Diagnosis not present

## 2013-12-24 DIAGNOSIS — Z87891 Personal history of nicotine dependence: Secondary | ICD-10-CM | POA: Diagnosis not present

## 2013-12-24 DIAGNOSIS — I739 Peripheral vascular disease, unspecified: Secondary | ICD-10-CM | POA: Diagnosis not present

## 2013-12-24 DIAGNOSIS — Z8673 Personal history of transient ischemic attack (TIA), and cerebral infarction without residual deficits: Secondary | ICD-10-CM | POA: Insufficient documentation

## 2013-12-24 DIAGNOSIS — K219 Gastro-esophageal reflux disease without esophagitis: Secondary | ICD-10-CM | POA: Diagnosis not present

## 2013-12-24 HISTORY — PX: AV FISTULA PLACEMENT: SHX1204

## 2013-12-24 LAB — GLUCOSE, CAPILLARY: Glucose-Capillary: 178 mg/dL — ABNORMAL HIGH (ref 70–99)

## 2013-12-24 LAB — POCT I-STAT 4, (NA,K, GLUC, HGB,HCT)
Glucose, Bld: 162 mg/dL — ABNORMAL HIGH (ref 70–99)
HCT: 35 % — ABNORMAL LOW (ref 36.0–46.0)
Hemoglobin: 11.9 g/dL — ABNORMAL LOW (ref 12.0–15.0)
POTASSIUM: 4.2 meq/L (ref 3.7–5.3)
SODIUM: 139 meq/L (ref 137–147)

## 2013-12-24 SURGERY — INSERTION OF ARTERIOVENOUS (AV) GORE-TEX GRAFT ARM
Anesthesia: Monitor Anesthesia Care | Site: Arm Upper | Laterality: Right

## 2013-12-24 MED ORDER — FENTANYL CITRATE 0.05 MG/ML IJ SOLN
INTRAMUSCULAR | Status: DC | PRN
  Administered 2013-12-24 (×3): 25 ug via INTRAVENOUS

## 2013-12-24 MED ORDER — OXYCODONE-ACETAMINOPHEN 5-325 MG PO TABS: 1.0000 | ORAL_TABLET | ORAL | Status: DC | PRN

## 2013-12-24 MED ORDER — PROPOFOL 10 MG/ML IV BOLUS
INTRAVENOUS | Status: AC
  Filled 2013-12-24: qty 20

## 2013-12-24 MED ORDER — SODIUM CHLORIDE 0.9 % IR SOLN
Status: DC | PRN
  Administered 2013-12-24: 08:00:00

## 2013-12-24 MED ORDER — LIDOCAINE-EPINEPHRINE (PF) 1 %-1:200000 IJ SOLN
INTRAMUSCULAR | Status: DC | PRN
  Administered 2013-12-24: 30 mL

## 2013-12-24 MED ORDER — LIDOCAINE HCL (PF) 1 % IJ SOLN
INTRAMUSCULAR | Status: DC | PRN
  Administered 2013-12-24: 3 mL
  Administered 2013-12-24: 2 mL
  Administered 2013-12-24: 3 mL

## 2013-12-24 MED ORDER — PROPOFOL 10 MG/ML IV EMUL
INTRAVENOUS | Status: DC | PRN
  Administered 2013-12-24: 50 ug/kg/min via INTRAVENOUS

## 2013-12-24 MED ORDER — ROCURONIUM BROMIDE 50 MG/5ML IV SOLN
INTRAVENOUS | Status: AC
  Filled 2013-12-24: qty 1

## 2013-12-24 MED ORDER — SODIUM CHLORIDE 0.9 % IV SOLN
INTRAVENOUS | Status: DC
  Administered 2013-12-24: 07:00:00 via INTRAVENOUS

## 2013-12-24 MED ORDER — OXYCODONE HCL 5 MG PO TABS: 5.0000 mg | ORAL_TABLET | Freq: Once | ORAL | Status: DC | PRN

## 2013-12-24 MED ORDER — CHLORHEXIDINE GLUCONATE CLOTH 2 % EX PADS: 6.0000 | MEDICATED_PAD | Freq: Once | CUTANEOUS | Status: DC

## 2013-12-24 MED ORDER — FENTANYL CITRATE 0.05 MG/ML IJ SOLN: INTRAMUSCULAR | Status: DC | PRN

## 2013-12-24 MED ORDER — THROMBIN 20000 UNITS EX SOLR
CUTANEOUS | Status: AC
  Filled 2013-12-24: qty 20000

## 2013-12-24 MED ORDER — MIDAZOLAM HCL 2 MG/2ML IJ SOLN
INTRAMUSCULAR | Status: AC
  Filled 2013-12-24: qty 2

## 2013-12-24 MED ORDER — OXYCODONE HCL 5 MG/5ML PO SOLN: 5.0000 mg | Freq: Once | ORAL | Status: DC | PRN

## 2013-12-24 MED ORDER — FENTANYL CITRATE 0.05 MG/ML IJ SOLN: 25.0000 ug | INTRAMUSCULAR | Status: DC | PRN

## 2013-12-24 MED ORDER — GLYCOPYRROLATE 0.2 MG/ML IJ SOLN
INTRAMUSCULAR | Status: AC
  Filled 2013-12-24: qty 3

## 2013-12-24 MED ORDER — LIDOCAINE HCL (CARDIAC) 20 MG/ML IV SOLN
INTRAVENOUS | Status: AC
  Filled 2013-12-24: qty 5

## 2013-12-24 MED ORDER — ONDANSETRON HCL 4 MG/2ML IJ SOLN
INTRAMUSCULAR | Status: AC
  Filled 2013-12-24: qty 2

## 2013-12-24 MED ORDER — FENTANYL CITRATE 0.05 MG/ML IJ SOLN
INTRAMUSCULAR | Status: AC
  Filled 2013-12-24: qty 5

## 2013-12-24 MED ORDER — PROTAMINE SULFATE 10 MG/ML IV SOLN
INTRAVENOUS | Status: DC | PRN
  Administered 2013-12-24 (×3): 10 mg via INTRAVENOUS

## 2013-12-24 MED ORDER — HEPARIN SODIUM (PORCINE) 1000 UNIT/ML IJ SOLN
INTRAMUSCULAR | Status: DC | PRN
  Administered 2013-12-24: 4000 [IU] via INTRAVENOUS

## 2013-12-24 MED ORDER — LIDOCAINE-EPINEPHRINE (PF) 1 %-1:200000 IJ SOLN
INTRAMUSCULAR | Status: AC
  Filled 2013-12-24: qty 10

## 2013-12-24 MED ORDER — NEOSTIGMINE METHYLSULFATE 10 MG/10ML IV SOLN
INTRAVENOUS | Status: AC
  Filled 2013-12-24: qty 1

## 2013-12-24 MED ORDER — LIDOCAINE HCL (PF) 1 % IJ SOLN
INTRAMUSCULAR | Status: AC
  Filled 2013-12-24: qty 30

## 2013-12-24 MED ORDER — 0.9 % SODIUM CHLORIDE (POUR BTL) OPTIME
TOPICAL | Status: DC | PRN
  Administered 2013-12-24: 1000 mL

## 2013-12-24 MED ORDER — SUCCINYLCHOLINE CHLORIDE 20 MG/ML IJ SOLN
INTRAMUSCULAR | Status: AC
  Filled 2013-12-24: qty 1

## 2013-12-24 SURGICAL SUPPLY — 33 items
ADH SKN CLS APL DERMABOND .7 (GAUZE/BANDAGES/DRESSINGS) ×2
ARMBAND PINK RESTRICT EXTREMIT (MISCELLANEOUS) ×4 IMPLANT
BLADE SURG 10 STRL SS (BLADE) ×8 IMPLANT
CANISTER SUCTION 2500CC (MISCELLANEOUS) ×4 IMPLANT
CANNULA VESSEL 3MM 2 BLNT TIP (CANNULA) ×3 IMPLANT
CLIP TI MEDIUM 6 (CLIP) ×4 IMPLANT
CLIP TI WIDE RED SMALL 6 (CLIP) ×4 IMPLANT
COVER PROBE W GEL 5X96 (DRAPES) IMPLANT
COVER SURGICAL LIGHT HANDLE (MISCELLANEOUS) ×4 IMPLANT
DECANTER SPIKE VIAL GLASS SM (MISCELLANEOUS) ×4 IMPLANT
DERMABOND ADVANCED (GAUZE/BANDAGES/DRESSINGS) ×2
DERMABOND ADVANCED .7 DNX12 (GAUZE/BANDAGES/DRESSINGS) ×2 IMPLANT
DRAIN PENROSE 1/2X12 LTX STRL (WOUND CARE) IMPLANT
ELECT REM PT RETURN 9FT ADLT (ELECTROSURGICAL) ×4
ELECTRODE REM PT RTRN 9FT ADLT (ELECTROSURGICAL) ×2 IMPLANT
GLOVE BIO SURGEON STRL SZ7.5 (GLOVE) ×4 IMPLANT
GLOVE BIOGEL PI IND STRL 8 (GLOVE) ×2 IMPLANT
GLOVE BIOGEL PI INDICATOR 8 (GLOVE) ×2
GOWN STRL REUS W/ TWL LRG LVL3 (GOWN DISPOSABLE) ×6 IMPLANT
GOWN STRL REUS W/TWL LRG LVL3 (GOWN DISPOSABLE) ×12
GRAFT GORETEX STRT 4-7X45 (Vascular Products) ×3 IMPLANT
KIT BASIN OR (CUSTOM PROCEDURE TRAY) ×4 IMPLANT
KIT ROOM TURNOVER OR (KITS) ×4 IMPLANT
NS IRRIG 1000ML POUR BTL (IV SOLUTION) ×4 IMPLANT
PACK CV ACCESS (CUSTOM PROCEDURE TRAY) ×4 IMPLANT
PAD ARMBOARD 7.5X6 YLW CONV (MISCELLANEOUS) ×8 IMPLANT
SPONGE SURGIFOAM ABS GEL 100 (HEMOSTASIS) IMPLANT
SUT PROLENE 6 0 BV (SUTURE) ×13 IMPLANT
SUT VIC AB 3-0 SH 27 (SUTURE) ×4
SUT VIC AB 3-0 SH 27X BRD (SUTURE) ×2 IMPLANT
SUT VICRYL 4-0 PS2 18IN ABS (SUTURE) ×7 IMPLANT
UNDERPAD 30X30 INCONTINENT (UNDERPADS AND DIAPERS) ×4 IMPLANT
WATER STERILE IRR 1000ML POUR (IV SOLUTION) ×4 IMPLANT

## 2013-12-24 NOTE — Op Note (Signed)
    NAME: [name]   MRN: [id number] DOB: [birth year]    DATE OF OPERATION: 12/24/2013  PREOP DIAGNOSIS: End-stage renal disease  POSTOP DIAGNOSIS: Same  PROCEDURE: right upper arm loop AV graft  SURGEON: Di Kindle. Edilia Bo, MD, FACS  ASSIST: Tilden Fossa RNFA  ANESTHESIA: local with sedation   EBL: minimal  INDICATIONS: [name] is a 80 y.o. female who presents for new access.  FINDINGS: the cephalic vein and basilic vein were not adequate for an AV fistula. The patient had a high bifurcation of the brachial artery in the axilla. For this reason an upper arm loop graft was placed.  TECHNIQUE: The patient was taken to the operating room and sedated by anesthesia. Right upper extremity was prepped and draped in usual sterile fashion. After the skin was infiltrated with 1 lidocaine, a longitudinal incision was made over the brachial artery above the antecubital level. The brachial veins were quite small. The artery here was very small and after interrogation with the Doppler it was clear that the patient had a high bifurcation of the brachial artery. For this reason I elected to place an upper arm loop. After the skin was anesthetized, a longitudinal incision was made beneath the axilla. Here the high brachial vein and brachial artery were dissected free. Note the brachial artery and bifurcated in the axilla. A 4-to 7 mm PTFE graft is then tunneled in a loop fashion in the upper arm with the arterial aspect of the graft along the lateral aspect of the upper arm. This was done after the skin was anesthetized. The patient was heparinized. The brachial artery was clamped proximally and distally just above the bifurcation. A longitudinal arteriotomy was made. A short segment of the 4 mm end of the graft was excised, and the graft slightly spatulated, and sewn end-to-side to the artery using continuous 6-0 Prolene suture. The graft and poor the proper length for anastomosis to the high  brachial vein. The vein was ligated distally and spatulated proximally. The graft was cut to the appropriate length, spatulated and sewn end-to-end to the vein using continuous 6-0 Prolene suture. At the completion was excellent thrill in the fistula a good radial signal with the Doppler. The heparin was partially reversed with protamine. The wounds were closed the deep layer 3-0 Vicryl the skin closed with 4-0 Vicryl. Dermabond was applied. The patient tolerated the procedure well and was transferred to the recovery room in stable condition. All needle and sponge counts were correct  Waverly Ferrari, MD, FACS [facility]  DATE OF DICTATION:   12/24/2013

## 2013-12-24 NOTE — Anesthesia Preprocedure Evaluation (Signed)
Anesthesia Evaluation  Patient identified by MRN, date of birth, ID band Patient awake    Reviewed: Allergy & Precautions, H&P , NPO status , Patient's Chart, lab work & pertinent test results, reviewed documented beta blocker date and time   History of Anesthesia Complications Negative for: history of anesthetic complications  Airway Mallampati: II TM Distance: >3 FB Neck ROM: Full    Dental  (+) Edentulous Upper, Edentulous Lower   Pulmonary neg shortness of breath, neg sleep apnea, neg COPDneg recent URI, former smoker,  breath sounds clear to auscultation        Cardiovascular hypertension, Pt. on medications and Pt. on home beta blockers + CAD, + Past MI, + Peripheral Vascular Disease and +CHF - dysrhythmias - Valvular Problems/MurmursRhythm:Regular  Multiple MIs without stents or CABG, EF 50-55% with mild MR, not active due to amputations, denies chest pain or sob   Neuro/Psych H/o peripheral neuropathy, TIA x1, no residual deficits  TIA Neuromuscular disease negative psych ROS   GI/Hepatic Neg liver ROS, GERD-  Medicated and Controlled,  Endo/Other  diabetes, Type 2, Insulin DependentHypothyroidism   Renal/GU Renal InsufficiencyRenal disease     Musculoskeletal   Abdominal   Peds  Hematology  (+) anemia ,   Anesthesia Other Findings   Reproductive/Obstetrics                           Anesthesia Physical Anesthesia Plan  ASA: III  Anesthesia Plan: MAC and General   Post-op Pain Management:    Induction: Intravenous  Airway Management Planned: Natural Airway, Simple Face Mask and Oral ETT  Additional Equipment: None  Intra-op Plan:   Post-operative Plan: Extubation in OR  Informed Consent: I have reviewed the patients History and Physical, chart, labs and discussed the procedure including the risks, benefits and alternatives for the proposed anesthesia with the patient or  authorized representative who has indicated his/her understanding and acceptance.   Dental advisory given  Plan Discussed with:   Anesthesia Plan Comments: (MAC vs General )        Anesthesia Quick Evaluation

## 2013-12-24 NOTE — Discharge Instructions (Signed)

## 2013-12-24 NOTE — Anesthesia Procedure Notes (Signed)
Procedure Name: MAC Date/Time: 12/24/2013 7:30 AM Performed by: Edmonia Caprio Oxygen Delivery Method: Nasal cannula

## 2013-12-24 NOTE — Transfer of Care (Signed)
Immediate Anesthesia Transfer of Care Note  Patient: [name]  Procedure(s) Performed: Procedure(s): INSERTION OF ARTERIOVENOUS (AV) GORE-TEX GRAFT ARM (Right)  Patient Location: PACU  Anesthesia Type:MAC  Level of Consciousness: awake and alert   Airway & Oxygen Therapy: Patient Spontanous Breathing and Patient connected to nasal cannula oxygen  Post-op Assessment: Report given to PACU RN, Post -op Vital signs reviewed and stable and Patient moving all extremities  Post vital signs: Reviewed and stable  Complications: No apparent anesthesia complications

## 2013-12-24 NOTE — Interval H&P Note (Signed)
History and Physical Interval Note:  12/24/2013 7:19 AM  [name]  has presented today for surgery, with the diagnosis of CKD, stage 5  The various methods of treatment have been discussed with the patient and family. After consideration of risks, benefits and other options for treatment, the patient has consented to  Procedure(s): ARTERIOVENOUS (AV) FISTULA CREATION VERUS GRAFT INSERTION (Right) as a surgical intervention .  The patient's history has been reviewed, patient examined, no change in status, stable for surgery.  I have reviewed the patient's chart and labs.  Questions were answered to the patient's satisfaction.     DICKSON,CHRISTOPHER S

## 2013-12-24 NOTE — H&P (View-Only) (Signed)
[facility] [place]  Patient name: [name] MRN: [id number] DOB: 02-02-1924 Sex: female  REASON FOR VISIT: Evaluate for new hemodialysis access.  HPI: [name] is a 80 y.o. female who I last saw on 09/04/2013. She has had a previous left below the knee amputation. She is nonambulatory. She has known infrainguinal arterial occlusive disease on the right. She is not yet on dialysis. I have reviewed her records and her GFR is 15. She did have a left basilic vein transposition in March of 2013 but ultimately this failed. She denies any recent uremic symptoms. Specifically she denies nausea, vomiting, fatigue, anorexia, or palpitations.  Past Medical History  Diagnosis Date  . Coronary artery disease     a. Cath 09/2010 - med rx.  Marland Kitchen TIA (transient ischemic attack)   . Hypertension   . Shingles   . Stroke   . Anemia   . Cardiomyopathy, idiopathic 02/08/2011  . Myocardial infarct     x 3 unsure of years  . Irregular heartbeat   . Ulcer   . GERD (gastroesophageal reflux disease)   . Hx of transient ischemic attack (TIA)   . Memory loss   . Chronic diastolic CHF (congestive heart failure)     Takes Lasix  . Gout     takes allopurinol  . Peripheral vascular disease     a. s/p L BKA.  . Diabetes mellitus     type 2 NIDDM  . Chronic kidney disease (CKD), stage IV (severe)   . Arthritis   . Hypothyroidism     (SEVERE) Takes Levothryroxine  . Nonischemic cardiomyopathy     EF now is 55%, reduced due to myxedema, which is improved.  . Dyslipidemia   . Peripheral neuropathy   . H/O echocardiogram 09/06/11    Indication- nonIschemic Cardiomyopathy. EF = now greater than 55% with no regional wall motion abnormalities. Tthere is mild to moderate trisuspid regurgitayion and mild pulmonary hypertension with an RVSP of 35 mmHg as well as stage 1 diastolic dysfunction and mild to moderate LVH.  Marland Kitchen Abnormal nuclear stress test 06/01/09    Demonstrated a new area  of infarct scar, peri-infarct ischemia seen in the inferolateral territory. EF eas normal at 70% with mild hypocontractility at the apex, distal inferolateral wall.   Family History  Problem Relation Age of Onset  . Cancer Sister     STOMACH  . Diabetes Sister   . Cancer Brother     BONE  . Diabetes Brother   . Anesthesia problems Neg Hx   . Hypotension Neg Hx   . Malignant hyperthermia Neg Hx   . Pseudochol deficiency Neg Hx   . Hyperlipidemia Daughter   . Hypertension Daughter   . Heart disease Daughter     before age 43  . Kidney disease Daughter   . Other Daughter     varicose veins  . Diabetes Daughter   . Heart disease Son     before age 75  . Hyperlipidemia Son   . Hypertension Son   . Heart attack Son   . Heart attack Daughter   . Heart disease Daughter     Before age 46   SOCIAL HISTORY: History  Substance Use Topics  . Smoking status: Former Smoker -- 0.50 packs/day for 40 years    Quit date: 07/05/1986  . Smokeless tobacco: Former Neurosurgeon     Comment: quit smoking 1988  . Alcohol Use: No   Allergies  Allergen Reactions  . Aspirin Nausea And Vomiting    325 mg (adult strength) [name]    Current Outpatient Prescriptions  Medication Sig Dispense Refill  . acetaminophen (TYLENOL) 325 MG tablet Take 650 mg by mouth every 6 (six) hours as needed for mild pain.      Marland Kitchen. allopurinol (ZYLOPRIM) 100 MG tablet Take 100 mg by mouth daily.       Marland Kitchen. amLODipine (NORVASC) 10 MG tablet Take 5 mg by mouth daily.       Marland Kitchen. aspirin EC 81 MG tablet Take 81 mg by mouth daily.      . calcitRIOL (ROCALTROL) 0.25 MCG capsule Take 0.25 mcg by mouth daily.       . calcium acetate (PHOSLO) 667 MG capsule Take 1 capsule (667 mg total) by mouth 3 (three) times daily with meals.      . carvedilol (COREG) 6.25 MG tablet TAKE 1 TABLET (6.25 MG TOTAL) BY MOUTH 2 (TWO) TIMES DAILY WITH A MEAL.  60 tablet  7  . clopidogrel (PLAVIX)  75 MG tablet Take 75 mg by mouth daily.      Marland Kitchen. docusate sodium 100 MG CAPS Take 100 mg by mouth 2 (two) times daily.  10 capsule  0  . feeding supplement, GLUCERNA SHAKE, (GLUCERNA SHAKE) LIQD Take 237 mLs by mouth daily as needed (PLease offer to pt if meal intake is less than 50%).    0  . ferrous sulfate (MEIJER FERROUS SULFATE) 325 (65 FE) MG tablet Take 325 mg by mouth daily with breakfast.      . furosemide (LASIX) 80 MG tablet Take 2.5 tablets (200 mg total) by mouth 3 (three) times daily.      Marland Kitchen. gabapentin (NEURONTIN) 300 MG capsule Take 300 mg by mouth 3 (three) times daily.      Marland Kitchen. HYDROcodone-acetaminophen (NORCO/VICODIN) 5-325 MG per tablet Take 1 tablet by mouth every 12 (twelve) hours as needed for severe pain.  30 tablet  0  . insulin glargine (LANTUS) 100 UNIT/ML injection Inject 0.22 mLs (22 Units total) into the skin daily.  10 mL  11  . ipratropium-albuterol (DUONEB) 0.5-2.5 (3) MG/3ML SOLN Take 3 mLs by nebulization 4 (four) times daily.  360 mL    . isosorbide-hydrALAZINE (BIDIL) 20-37.5 MG per tablet Take 1 tablet by mouth 2 (two) times daily.      Marland Kitchen. levothyroxine (SYNTHROID, LEVOTHROID) 75 MCG tablet Take 75 mcg by mouth daily before breakfast.      . multivitamin (RENA-VIT) TABS tablet Take 1 tablet by mouth at bedtime.    0  . nitroGLYCERIN (NITROSTAT) 0.4 MG SL tablet Place 0.4 mg under the tongue every 5 (five) minutes as needed for chest pain.       . nortriptyline (PAMELOR) 50 MG capsule Take 50 mg by mouth daily.       . ONGLYZA 5 MG TABS tablet Take 5 mg by mouth daily.       . polyethylene glycol (MIRALAX / GLYCOLAX) packet Take 17 g by mouth daily.  14 each  0  . pravastatin (PRAVACHOL) 40 MG tablet Take 40 mg by mouth daily.      . travoprost, benzalkonium, (TRAVATAN) 0.004 % ophthalmic solution Place 1 drop into both eyes at bedtime.       . predniSONE (DELTASONE) 20 MG tablet Take 1 tablet (20 mg total) by mouth daily with breakfast. For 3 more days and  stop  prednisone      . RENVELA 800 MG tablet Take 800 mg by mouth 3 (three) times daily with meals.        No current facility-administered medications for this visit.   REVIEW OF SYSTEMS: [X ] denotes positive finding; [  ] denotes negative finding  CARDIOVASCULAR:  [ ] chest pain   [ ] chest pressure   [ ] palpitations   [ ] orthopnea   [ ] dyspnea on exertion   [ ] claudication   [ ] rest pain   [ ] DVT   [ ] phlebitis PULMONARY:   [ ] productive cough   [ ] asthma   [ ] wheezing NEUROLOGIC:   [ ] weakness  [ ] paresthesias  [ ] aphasia  [ ] amaurosis  [ ] dizziness HEMATOLOGIC:   [ ] bleeding problems   [ ] clotting disorders MUSCULOSKELETAL:  [ ] joint pain   [ ] joint swelling [ ] leg swelling GASTROINTESTINAL: [ ]  blood in stool  [ ]  hematemesis GENITOURINARY:  [ ]  dysuria  [ ]  hematuria PSYCHIATRIC:  [ ] history of major depression INTEGUMENTARY:  [ ] rashes  [ ] ulcers CONSTITUTIONAL:  [ ] fever   [ ] chills  PHYSICAL EXAM: Filed Vitals:   12/13/13 1421  BP: 198/78  Pulse: 80  Resp: 20  Weight: 127 lb (57.607 kg)   Cannot calculate BMI with a height equal to zero. GENERAL: The patient is a well-nourished female, in no acute distress. The vital signs are documented above. CARDIOVASCULAR: There is a regular rate and rhythm. She has a normal right radial and brachial pulse. She has a slightly diminished left radial pulse. PULMONARY: There is good air exchange bilaterally without wheezing or rales. ABDOMEN: Soft and non-tender with normal pitched bowel sounds.  MUSCULOSKELETAL: She has a left below the knee amputation. NEUROLOGIC: No focal weakness or paresthesias are detected. SKIN: There are no ulcers or rashes noted. PSYCHIATRIC: The patient has a normal affect.  DATA:  I have independently interpreted her vein mapping on the right which shows that her forearm and upper arm cephalic vein in the right are very small, less than 0.2 cm. The basilic vein on the right is short  with diameters ranging from 0.24 cm-0.31 cm.  MEDICAL ISSUES:  End stage renal disease Given that she has a stronger radial pulse on the right I have recommended placement of access in the right upper extremity. If the basilic vein appears to be adequate then we would do a basilic vein transposition. Likewise I will look at the cephalic vein myself at the time of surgery. Certainly we will try a fistula if at all possible. Otherwise we'll plan on placement of an AV graft, given that her GFR is 15. Her blood pressure and the office today with significantly elevated however she tells me that she did not take her medications this morning. I instructed her to be sure that she was taking her medications as of her blood pressure was too high when she presented for surgery she could potentially get canceled. I have explained the indications for placement of an AV fistula or AV graft. I've explained that if at all possible we will place an AV fistula.  I have reviewed the risks of placement of an AV fistula including but not limited to: failure of the fistula to mature, need for subsequent interventions, and thrombosis. In addition I have   reviewed the potential complications of placement of an AV graft. These risks include, but are not limited to, graft thrombosis, graft infection, wound healing problems, bleeding, arm swelling, and steal syndrome. All the patient's questions were answered and they are agreeable to proceed with surgery. Her surgery is scheduled for 12/24/2013.    Empress Newmann S [facility] [place] Beeper: [phone]

## 2013-12-25 NOTE — Anesthesia Postprocedure Evaluation (Signed)
  Anesthesia Post-op Note  Patient: [name]  Procedure(s) Performed: Procedure(s): INSERTION OF ARTERIOVENOUS (AV) GORE-TEX GRAFT ARM (Right)  Patient Location: PACU  Anesthesia Type:MAC  Level of Consciousness: awake  Airway and Oxygen Therapy: Patient Spontanous Breathing  Post-op Pain: none  Post-op Assessment: Post-op Vital signs reviewed, Patient's Cardiovascular Status Stable, Respiratory Function Stable, Patent Airway, No signs of Nausea or vomiting and Pain level controlled  Post-op Vital Signs: Reviewed and stable  Last Vitals:  Filed Vitals:   12/24/13 1128  BP: 128/67  Pulse: 81  Temp:   Resp: 15    Complications: No apparent anesthesia complications

## 2013-12-26 ENCOUNTER — Encounter (HOSPITAL_COMMUNITY): Payer: Self-pay | Admitting: Vascular Surgery

## 2014-01-14 ENCOUNTER — Encounter: Payer: Self-pay | Admitting: Vascular Surgery

## 2014-01-15 ENCOUNTER — Encounter: Payer: Self-pay | Admitting: Vascular Surgery

## 2014-01-15 ENCOUNTER — Ambulatory Visit (INDEPENDENT_AMBULATORY_CARE_PROVIDER_SITE_OTHER): Payer: Medicare Other | Admitting: Vascular Surgery

## 2014-01-15 VITALS — BP 152/63 | HR 72 | Temp 97.7°F | Ht <= 58 in | Wt 127.0 lb

## 2014-01-15 DIAGNOSIS — N186 End stage renal disease: Secondary | ICD-10-CM

## 2014-01-15 DIAGNOSIS — I739 Peripheral vascular disease, unspecified: Secondary | ICD-10-CM

## 2014-01-15 NOTE — Assessment & Plan Note (Signed)
I have previously evaluated this patient's peripheral vascular disease. She has evidence of infrainguinal arterial occlusive disease on the right with rest pain. She is 80 years old, and has diabetes and chronic kidney disease. In addition she has coronary artery disease. She is nonambulatory and is status post left below the knee amputation. I would not recommend an aggressive approach to revascularization on the right for all of these reasons. I have explained to the patient and her family, that the risk of arteriography is that we could certainly put her on dialysis. She really doesn't have any good options for dialysis as she has no femoral pulse on the left on the side of her BKA, and given her severe infrainguinal arterial occlusive disease on the right I would not want to place a right thigh graft. We could consider using CO2 but I do not think this would provide adequate visualization to determine what her options might be.  Thus the options are continued conservative treatment if her symptoms are tolerable versus primary amputation on the right, versus proceeding with arteriography and understanding that this would likely put her on dialysis.

## 2014-01-15 NOTE — Assessment & Plan Note (Signed)
Her right upper arm graft is functioning well. She has some slight wound separation in the axilla and we have instructed the family on dressing changes. She'll come back for a wound check in one week.

## 2014-01-15 NOTE — Progress Notes (Signed)
   Patient name: [name] MRN: [id number] DOB: 04-07-1923 Sex: female  REASON FOR VISIT: Follow up afternoon a right upper arm AV graft.  HPI: [name] is a 80 y.o. female who underwent placement of a new right upper arm loop AV graft on 12/24/2013. She did not have an adequate cephalic vein or basilic vein for a fistula. In addition, she had a high bifurcation of the brachial artery in the axilla and for this reason an upper arm loop graft was placed. The family was concerned about some swelling in the right upper arm and she comes in for a visit.  She has an amputation on the left lower extremity with pain in the right foot therefore she has required full assistance in moving. Her son was concerned that by lifting her that may have caused some separation of the axillary incision. He denies fever or chills.  REVIEW OF SYSTEMS: [name] ] denotes positive finding; [  ] denotes negative finding  CARDIOVASCULAR:  [ ]  chest pain   [ ]  dyspnea on exertion    CONSTITUTIONAL:  [ ]  fever   [ ]  chills  PHYSICAL EXAM: Filed Vitals:   01/15/14 1051  BP: 152/63  Pulse: 72  Temp: 97.7 F (36.5 C)  TempSrc: Oral  Height: 4\' 9"  (1.448 m)  Weight: 127 lb (57.607 kg)  SpO2: 94%   Body mass index is 27.48 kg/(m^2). GENERAL: The patient is a well-nourished female, in no acute distress. The vital signs are documented above. CARDIOVASCULAR: There is a regular rate and rhythm. PULMONARY: There is good air exchange bilaterally without wheezing or rales. There is a good thrill in the right upper arm AV graft. The right hand is warm and well-perfused. The axillary incision does have some slight separation and we will begin dressing changes on this. She does have rest pain in the right foot.  MEDICAL ISSUES:  Peripheral vascular disease I have previously evaluated this patient's peripheral vascular disease. She has evidence of infrainguinal arterial occlusive disease on the right with rest pain. She  is 80 years old, and has diabetes and chronic kidney disease. In addition she has coronary artery disease. She is nonambulatory and is status post left below the knee amputation. I would not recommend an aggressive approach to revascularization on the right for all of these reasons. I have explained to the patient and her family, that the risk of arteriography is that we could certainly put her on dialysis. She really doesn't have any good options for dialysis as she has no femoral pulse on the left on the side of her BKA, and given her severe infrainguinal arterial occlusive disease on the right I would not want to place a right thigh graft. We could consider using CO2 but I do not think this would provide adequate visualization to determine what her options might be.  Thus the options are continued conservative treatment if her symptoms are tolerable versus primary amputation on the right, versus proceeding with arteriography and understanding that this would likely put her on dialysis.  End stage renal disease Her right upper arm graft is functioning well. She has some slight wound separation in the axilla and we have instructed the family on dressing changes. She'll come back for a wound check in one week.   Poseidon Pam S [facility] [place] Beeper: [phone]

## 2014-01-22 ENCOUNTER — Encounter: Payer: Self-pay | Admitting: *Deleted

## 2014-01-22 ENCOUNTER — Ambulatory Visit (INDEPENDENT_AMBULATORY_CARE_PROVIDER_SITE_OTHER): Payer: Medicare Other | Admitting: *Deleted

## 2014-01-22 ENCOUNTER — Ambulatory Visit: Payer: Medicare Other | Admitting: *Deleted

## 2014-01-22 VITALS — BP 150/78 | HR 82 | Temp 98.3°F | Resp 16

## 2014-01-22 DIAGNOSIS — T8131XD Disruption of external operation (surgical) wound, not elsewhere classified, subsequent encounter: Secondary | ICD-10-CM

## 2014-01-22 DIAGNOSIS — I70229 Atherosclerosis of native arteries of extremities with rest pain, unspecified extremity: Secondary | ICD-10-CM

## 2014-01-22 MED ORDER — OXYCODONE-ACETAMINOPHEN 5-325 MG PO TABS: 1.0000 | ORAL_TABLET | Freq: Three times a day (TID) | ORAL | Status: DC | PRN

## 2014-01-22 NOTE — Progress Notes (Signed)
Patient came in for a wound check per Dr. Adele Dan last office note. Patient is doing well overall, she has been afebrile. Her right axillary wound (1x2cm) is healing nicely and she had a stitch abscess at the distal end of her incision. This suture was clipped without any difficulty. Patient is going to start wet to dry dressings daily to her axillary wound. Patient and her family are instructed on how to do these dressings and also on wound cleansing techniques. They will call us if patient has any signs or symptoms of infection such as fever, erythema or drainage.  [name] also was asking for additional pain meds for her right foot ischemia. Her last Percocet Rx was on 12-24-13 for #20. I discussed with [name] and he rx'd #20 Percocet 5-325mg  one every 8 hours prn. Patient will eventually need amputation per Dr. Adele Dan past notes.  Patient and family voiced agreement and understanding of her care plan.

## 2014-01-22 NOTE — Progress Notes (Signed)
Note is on separate encounter for this date.

## 2014-02-05 ENCOUNTER — Ambulatory Visit: Payer: Medicare Other | Admitting: *Deleted

## 2014-02-05 ENCOUNTER — Encounter: Payer: Self-pay | Admitting: *Deleted

## 2014-02-05 VITALS — BP 140/59 | HR 67 | Resp 18

## 2014-02-05 DIAGNOSIS — T8131XD Disruption of external operation (surgical) wound, not elsewhere classified, subsequent encounter: Secondary | ICD-10-CM

## 2014-02-05 DIAGNOSIS — T8131XA Disruption of external operation (surgical) wound, not elsewhere classified, initial encounter: Secondary | ICD-10-CM | POA: Insufficient documentation

## 2014-02-05 NOTE — Progress Notes (Signed)
[name] is s/p 12-24-2013 right upper arm loop AV graft by [name].  [name] is 2 week FU for post op wound check.   Right axillary wound 1 cm x 2 cm. Size of wound is unchanged from 2 weeks ago.  Wound is clean and dry with no erythema or drainage noted.  Temp is 98.0 F.  Pt. Has no complaints of pain around incisional area.  [name] son states that her dressing is being changed every other day (wet to dry dressing).  [name] checked wound and stated for [name] to continue wet to dry dressing daily.  New wet to dry dressing applied.  Reinforced to [name] and her son to change dressing daily.

## 2014-02-12 ENCOUNTER — Ambulatory Visit: Payer: Medicare Other | Admitting: Internal Medicine

## 2014-02-27 ENCOUNTER — Encounter (HOSPITAL_COMMUNITY): Payer: Self-pay | Admitting: Vascular Surgery

## 2014-03-02 ENCOUNTER — Encounter (HOSPITAL_COMMUNITY): Payer: Self-pay | Admitting: Family Medicine

## 2014-03-02 ENCOUNTER — Emergency Department (HOSPITAL_COMMUNITY)
Admission: EM | Admit: 2014-03-02 | Discharge: 2014-03-02 | Disposition: A | Discharge status: Home / Self Care | Payer: Medicare Other | Attending: Emergency Medicine | Admitting: Emergency Medicine

## 2014-03-02 ENCOUNTER — Emergency Department (HOSPITAL_COMMUNITY): Payer: Medicare Other

## 2014-03-02 DIAGNOSIS — Z794 Long term (current) use of insulin: Secondary | ICD-10-CM | POA: Diagnosis not present

## 2014-03-02 DIAGNOSIS — Z8719 Personal history of other diseases of the digestive system: Secondary | ICD-10-CM | POA: Diagnosis not present

## 2014-03-02 DIAGNOSIS — M7989 Other specified soft tissue disorders: Secondary | ICD-10-CM | POA: Diagnosis present

## 2014-03-02 DIAGNOSIS — Z862 Personal history of diseases of the blood and blood-forming organs and certain disorders involving the immune mechanism: Secondary | ICD-10-CM | POA: Diagnosis not present

## 2014-03-02 DIAGNOSIS — E785 Hyperlipidemia, unspecified: Secondary | ICD-10-CM | POA: Insufficient documentation

## 2014-03-02 DIAGNOSIS — Z79899 Other long term (current) drug therapy: Secondary | ICD-10-CM | POA: Insufficient documentation

## 2014-03-02 DIAGNOSIS — Z7902 Long term (current) use of antithrombotics/antiplatelets: Secondary | ICD-10-CM | POA: Diagnosis not present

## 2014-03-02 DIAGNOSIS — Z8619 Personal history of other infectious and parasitic diseases: Secondary | ICD-10-CM | POA: Diagnosis not present

## 2014-03-02 DIAGNOSIS — Z7982 Long term (current) use of aspirin: Secondary | ICD-10-CM | POA: Insufficient documentation

## 2014-03-02 DIAGNOSIS — M1990 Unspecified osteoarthritis, unspecified site: Secondary | ICD-10-CM | POA: Diagnosis not present

## 2014-03-02 DIAGNOSIS — E119 Type 2 diabetes mellitus without complications: Secondary | ICD-10-CM | POA: Insufficient documentation

## 2014-03-02 DIAGNOSIS — I120 Hypertensive chronic kidney disease with stage 5 chronic kidney disease or end stage renal disease: Secondary | ICD-10-CM | POA: Diagnosis not present

## 2014-03-02 DIAGNOSIS — Z8673 Personal history of transient ischemic attack (TIA), and cerebral infarction without residual deficits: Secondary | ICD-10-CM | POA: Insufficient documentation

## 2014-03-02 DIAGNOSIS — I252 Old myocardial infarction: Secondary | ICD-10-CM | POA: Insufficient documentation

## 2014-03-02 DIAGNOSIS — I5032 Chronic diastolic (congestive) heart failure: Secondary | ICD-10-CM | POA: Insufficient documentation

## 2014-03-02 DIAGNOSIS — I2510 Atherosclerotic heart disease of native coronary artery without angina pectoris: Secondary | ICD-10-CM | POA: Diagnosis not present

## 2014-03-02 DIAGNOSIS — R600 Localized edema: Secondary | ICD-10-CM | POA: Insufficient documentation

## 2014-03-02 DIAGNOSIS — Z87891 Personal history of nicotine dependence: Secondary | ICD-10-CM | POA: Diagnosis not present

## 2014-03-02 DIAGNOSIS — N186 End stage renal disease: Secondary | ICD-10-CM | POA: Insufficient documentation

## 2014-03-02 DIAGNOSIS — E039 Hypothyroidism, unspecified: Secondary | ICD-10-CM | POA: Diagnosis not present

## 2014-03-02 DIAGNOSIS — R609 Edema, unspecified: Secondary | ICD-10-CM

## 2014-03-02 LAB — PRO B NATRIURETIC PEPTIDE: Pro B Natriuretic peptide (BNP): 650.8 pg/mL — ABNORMAL HIGH (ref 0–450)

## 2014-03-02 LAB — BASIC METABOLIC PANEL
ANION GAP: 15 (ref 5–15)
BUN: 37 mg/dL — ABNORMAL HIGH (ref 6–23)
CHLORIDE: 97 meq/L (ref 96–112)
CO2: 26 meq/L (ref 19–32)
CREATININE: 2.76 mg/dL — AB (ref 0.50–1.10)
Calcium: 9.3 mg/dL (ref 8.4–10.5)
GFR calc Af Amer: 17 mL/min — ABNORMAL LOW (ref >90)
GFR calc non Af Amer: 14 mL/min — ABNORMAL LOW (ref >90)
GLUCOSE: 107 mg/dL — AB (ref 70–99)
Potassium: 4.1 mEq/L (ref 3.7–5.3)
Sodium: 138 mEq/L (ref 137–147)

## 2014-03-02 NOTE — ED Provider Notes (Signed)
CSN: [id number]     Arrival date & time 03/02/14  1714 History   First MD Initiated Contact with Patient 03/02/14 1747     Chief Complaint  Patient presents with  . Leg Swelling    HPI  [name] is a 80 year old female with CHF & ESRD who presents today with R face, neck, and arm swelling. Her son reports she has been having swelling for the past 3 days which has been worsening. She normally takes Lasix 160mg  in the morning, 80mg  at night and has not missed any doses; she has not taken her BP meds today. Otherwise, she denies any chest pain, nausea, vomiting, diarrhea, dyspnea, sick contacts.  Past Medical History  Diagnosis Date  . Coronary artery disease     a. Cath 09/2010 - med rx.  Marland Kitchen TIA (transient ischemic attack)   . Hypertension   . Shingles   . Stroke   . Anemia   . Cardiomyopathy, idiopathic 02/08/2011  . Myocardial infarct     x 3 unsure of years  . Irregular heartbeat   . Ulcer   . GERD (gastroesophageal reflux disease)   . Hx of transient ischemic attack (TIA)   . Memory loss   . Chronic diastolic CHF (congestive heart failure)     Takes Lasix  . Gout     takes allopurinol  . Peripheral vascular disease     a. s/p L BKA.  . Diabetes mellitus     type 2 NIDDM  . Chronic kidney disease (CKD), stage IV (severe)   . Arthritis   . Hypothyroidism     (SEVERE) Takes Levothryroxine  . Nonischemic cardiomyopathy     EF now is 55%, reduced due to myxedema, which is improved.  . Dyslipidemia   . Peripheral neuropathy   . H/O echocardiogram 09/06/11    Indication- nonIschemic Cardiomyopathy. EF = now greater than 55% with no regional wall motion abnormalities. Tthere is mild to moderate trisuspid regurgitayion and mild pulmonary hypertension with an RVSP of 35 mmHg as well as stage 1 diastolic dysfunction and mild to moderate LVH.  Marland Kitchen Abnormal nuclear stress test 06/01/09    Demonstrated a new area of infarct scar, peri-infarct ischemia seen in the inferolateral  territory. EF eas normal at 70% with mild hypocontractility at the apex, distal inferolateral wall.   Past Surgical History  Procedure Laterality Date  . Back surgery      [facility]  . Eye surgery      Left eye surgery; cataract removal  . Left bka  07/05/2011  . Av fistula placement    . Amputation  07/05/2011    Procedure: AMPUTATION BELOW KNEE;  Surgeon: Chuck Hint, MD;  Location: [facility] OR;  Service: Vascular;  Laterality: Left;  . Angioplasty  1988  . Cardiac catheterization  09/28/07    Demonstrated multiple sequential lesions around 40 to 30% in the RCA territory.  . Left lower extremity venous duplex Left 06/27/11    Summary: No evidence of DVT involving the left lower extremity and right common femoral vein.   . Lower extremity arterial evaluation  06/27/11    SUMMARY: Right: ABI not ascertained due to false elevation in BP secondary to calcification (posterior tibial artery is non compressible). Left: ABI indicates moderate reduction in arterial flow. Bilateral: Great toe PPG waveforms indicate adequate perfusion. Great toe pressures not obtained due to patient's movements secondary to pain.  . Duplex doppler  05/10/11    LE  arterial dopplers demonstrate bilaterally reduced ABIs of 0.91 on right & 0.56 on left. She does report some decreased pain on the left, & there's moderate mixed-density plaque in the right SFA w/50 to 69% reduction. There's a 69% reduction in the left SFA & does appear to be occlusive disease of left posterior tibial artery. Right posterior dorsalis pedis artery demonstrates occlusive disease  . Insertion of dialysis catheter N/A 01/06/2013    Procedure: INSERTION OF DIALYSIS CATHETER;  Surgeon: Larina Earthly, MD;  Location: [facility] OR;  Service: Vascular;  Laterality: N/A;  . Av fistula placement Right 12/24/2013    Procedure: INSERTION OF ARTERIOVENOUS (AV) GORE-TEX GRAFT ARM;  Surgeon: Chuck Hint, MD;  Location: [facility] OR;  Service: Vascular;   Laterality: Right;  . Lower extremity angiogram N/A 10/31/2011    Procedure: LOWER EXTREMITY ANGIOGRAM;  Surgeon: Chuck Hint, MD;  Location: [facility] CATH LAB;  Service: Cardiovascular;  Laterality: N/A;   Family History  Problem Relation Age of Onset  . Cancer Sister     STOMACH  . Diabetes Sister   . Cancer Brother     BONE  . Diabetes Brother   . Anesthesia problems Neg Hx   . Hypotension Neg Hx   . Malignant hyperthermia Neg Hx   . Pseudochol deficiency Neg Hx   . Hyperlipidemia Daughter   . Hypertension Daughter   . Heart disease Daughter     before age 56  . Kidney disease Daughter   . Other Daughter     varicose veins  . Diabetes Daughter   . Heart disease Son     before age 64  . Hyperlipidemia Son   . Hypertension Son   . Heart attack Son   . Heart attack Daughter   . Heart disease Daughter     Before age 62   History  Substance Use Topics  . Smoking status: Former Smoker -- 0.50 packs/day for 40 years    Quit date: 07/05/1986  . Smokeless tobacco: Former Neurosurgeon     Comment: quit smoking 1988  . Alcohol Use: No   OB History    No data available     Review of Systems  Constitutional: Negative for fever.  HENT: Positive for facial swelling. Negative for trouble swallowing.   Cardiovascular: Negative for chest pain.  Gastrointestinal: Negative for nausea, vomiting, abdominal pain and diarrhea.      Allergies  Aspirin  Home Medications   Prior to Admission medications   Medication Sig Start Date End Date Taking? Authorizing Provider  acetaminophen (TYLENOL) 325 MG tablet Take 650 mg by mouth every 6 (six) hours as needed for mild pain.   Yes Historical Provider, MD  allopurinol (ZYLOPRIM) 100 MG tablet Take 200 mg by mouth daily.    Yes Historical Provider, MD  amLODipine (NORVASC) 10 MG tablet Take 10 mg by mouth daily.    Yes Historical Provider, MD  aspirin EC 81 MG tablet Take 81 mg by mouth daily.   Yes Historical Provider, MD  calcitRIOL  (ROCALTROL) 0.25 MCG capsule Take 0.25 mcg by mouth every other day.  11/04/12  Yes Historical Provider, MD  carvedilol (COREG) 6.25 MG tablet Take 6.25 mg by mouth 2 (two) times daily with a meal.   Yes Historical Provider, MD  clopidogrel (PLAVIX) 75 MG tablet Take 75 mg by mouth daily.   Yes Historical Provider, MD  docusate sodium (COLACE) 100 MG capsule Take 100 mg by mouth daily as needed for mild  constipation.   Yes Historical Provider, MD  feeding supplement, GLUCERNA SHAKE, (GLUCERNA SHAKE) LIQD Take 237 mLs by mouth daily as needed (PLease offer to pt if meal intake is less than 50%). 08/23/13  Yes Vassie Lollarlos Madera, MD  furosemide (LASIX) 80 MG tablet Take 80-160 mg by mouth 2 (two) times daily. Take 2 tablets in the morning and 1 tablet in the afternoon   Yes Historical Provider, MD  gabapentin (NEURONTIN) 300 MG capsule Take 300 mg by mouth 3 (three) times daily.   Yes Historical Provider, MD  insulin glargine (LANTUS) 100 UNIT/ML injection Inject 0.22 mLs (22 Units total) into the skin daily. 08/23/13  Yes Vassie Lollarlos Madera, MD  ipratropium-albuterol (DUONEB) 0.5-2.5 (3) MG/3ML SOLN Take 3 mLs by nebulization every 6 (six) hours as needed (for shortness of breath).   Yes Historical Provider, MD  isosorbide mononitrate (IMDUR) 60 MG 24 hr tablet Take 60 mg by mouth daily.   Yes Historical Provider, MD  levothyroxine (SYNTHROID, LEVOTHROID) 75 MCG tablet Take 75 mcg by mouth daily before breakfast.   Yes Historical Provider, MD  multivitamin (RENA-VIT) TABS tablet Take 1 tablet by mouth at bedtime. 01/15/13  Yes Osvaldo ShipperGokul Krishnan, MD  nitroGLYCERIN (NITROSTAT) 0.4 MG SL tablet Place 0.4 mg under the tongue every 5 (five) minutes as needed for chest pain.    Yes Historical Provider, MD  ONGLYZA 5 MG TABS tablet Take 5 mg by mouth daily.  11/22/12  Yes Historical Provider, MD  oxyCODONE-acetaminophen (ROXICET) 5-325 MG per tablet Take 1 tablet by mouth every 8 (eight) hours as needed for severe pain. 01/22/14   Yes Chuck Hinthristopher S Dickson, MD  pravastatin (PRAVACHOL) 40 MG tablet Take 40 mg by mouth daily.   Yes Historical Provider, MD  RENVELA 800 MG tablet Take 800 mg by mouth 3 (three) times daily with meals.  07/04/13  Yes Historical Provider, MD  travoprost, benzalkonium, (TRAVATAN) 0.004 % ophthalmic solution Place 1 drop into both eyes at bedtime.    Yes Historical Provider, MD  HYDROcodone-acetaminophen (NORCO/VICODIN) 5-325 MG per tablet Take 1 tablet by mouth every 12 (twelve) hours as needed for severe pain. Patient not taking: Reported on 03/02/2014 08/23/13   Vassie Lollarlos Madera, MD   BP 167/51 mmHg  Pulse 71  Temp(Src) 97.4 F (36.3 C)  Resp 13  SpO2 95% Physical Exam  Constitutional: She is oriented to person, place, and time. She appears well-developed and well-nourished. No distress.  HENT:  Head: Normocephalic and atraumatic.  Mouth/Throat: Oropharynx is clear and moist. No oropharyngeal exudate.  Maxillary swelling noted bilaterally.  Eyes: Conjunctivae are normal. Pupils are equal, round, and reactive to light.  Neck:  Swelling present bilaterally.  Cardiovascular: Normal rate and regular rhythm.  Exam reveals no gallop and no friction rub.   No murmur heard. Pulmonary/Chest: Effort normal and breath sounds normal. No respiratory distress. She has no wheezes. She has no rales.  Abdominal: Soft. Bowel sounds are normal. She exhibits no distension. There is no tenderness. There is no rebound.  Musculoskeletal:  R arm with graft and palpable thrill. Swelling only on R lateral arm. R ankle with edema.  Neurological: She is alert and oriented to person, place, and time. No cranial nerve deficit.  Skin: She is not diaphoretic.    ED Course  Procedures (including critical care time) Labs Review Labs Reviewed  PRO B NATRIURETIC PEPTIDE - Abnormal; Notable for the following:    Pro B Natriuretic peptide (BNP) 650.8 (*)    All other  components within normal limits  BASIC METABOLIC  PANEL - Abnormal; Notable for the following:    Glucose, Bld 107 (*)    BUN 37 (*)    Creatinine, Ser 2.76 (*)    GFR calc non Af Amer 14 (*)    GFR calc Af Amer 17 (*)    All other components within normal limits    Imaging Review Dg Chest 2 View  03/02/2014   CLINICAL DATA:  Whole-body edema.  EXAM: CHEST  2 VIEW  COMPARISON:  August 19, 2013.  FINDINGS: Stable cardiomediastinal silhouette. No pneumothorax or pleural effusion is noted. No acute pulmonary disease is noted. Degenerative change of left acromioclavicular joint is noted.  IMPRESSION: No acute cardiopulmonary abnormality seen.   Electronically Signed   By: Roque Lias M.D.   On: 03/02/2014 20:07      EKG Interpretation   Date/Time:  Sunday March 02 2014 17:44:48 EST Ventricular Rate:  72 PR Interval:  181 QRS Duration: 89 QT Interval:  484 QTC Calculation: 530 R Axis:   15 Text Interpretation:  Age not entered, assumed to be  80 years old for  purpose of ECG interpretation Sinus rhythm Low voltage, extremity leads  Prolonged QT interval No previous tracing Confirmed by BEATON  MD, ROBERT  ([id number]) on 03/02/2014 5:49:00 PM      MDM   Final diagnoses:  Edema of upper extremity    Acute CHF exacerbation given her prior history of similar episodes though swelling is only limited to her R side which raises concern for DVT. Plan to check BMET, BNP, & CXR.    928PM: Results reviewed and not concerning for acute CHF exacerbation or upper extremity DVT. She is not in any respiratory distress or exhibiting signs of hemodynamic instability. Per family, they called office and were advised to follow-up tomorrow for blood work.    Heywood Iles, MD 03/02/14 1610  Nelia Shi, MD 03/02/14 [phone]

## 2014-03-02 NOTE — ED Notes (Signed)
Pt here for facial swelling and right arm swelling. sts she had a fistula placed in her right arm. sts she is not on dialysis yet.

## 2014-03-02 NOTE — ED Notes (Signed)
MD at bedside. 

## 2014-03-02 NOTE — Progress Notes (Signed)
VASCULAR LAB PRELIMINARY  PRELIMINARY  PRELIMINARY  PRELIMINARY  Right upper extremity venous Doppler completed.    Preliminary report:  There is no obvious evidence of DVT or SVT noted in the right upper extremity.  AV  Fistula appears WNL.  Gadge Hermiz, RVT 03/02/2014, 7:12 PM

## 2014-03-13 ENCOUNTER — Other Ambulatory Visit: Payer: Self-pay

## 2014-03-16 ENCOUNTER — Other Ambulatory Visit: Payer: Self-pay | Admitting: Internal Medicine

## 2014-03-16 MED ORDER — SODIUM CHLORIDE 0.9 % IJ SOLN: 3.0000 mL | INTRAMUSCULAR | Status: DC | PRN

## 2014-03-17 ENCOUNTER — Encounter (HOSPITAL_COMMUNITY): Payer: Self-pay | Admitting: *Deleted

## 2014-03-17 ENCOUNTER — Ambulatory Visit (HOSPITAL_COMMUNITY)
Admission: RE | Admit: 2014-03-17 | Discharge: 2014-03-17 | Disposition: A | Discharge status: Home / Self Care | Payer: Medicare Other | Source: Ambulatory Visit | Attending: Vascular Surgery | Admitting: Vascular Surgery

## 2014-03-17 ENCOUNTER — Encounter (HOSPITAL_COMMUNITY)
Admission: RE | Disposition: A | Discharge status: Home / Self Care | Payer: Self-pay | Source: Ambulatory Visit | Attending: Vascular Surgery

## 2014-03-17 DIAGNOSIS — Z794 Long term (current) use of insulin: Secondary | ICD-10-CM | POA: Diagnosis not present

## 2014-03-17 DIAGNOSIS — M1990 Unspecified osteoarthritis, unspecified site: Secondary | ICD-10-CM | POA: Diagnosis not present

## 2014-03-17 DIAGNOSIS — N184 Chronic kidney disease, stage 4 (severe): Secondary | ICD-10-CM | POA: Diagnosis not present

## 2014-03-17 DIAGNOSIS — I739 Peripheral vascular disease, unspecified: Secondary | ICD-10-CM | POA: Diagnosis not present

## 2014-03-17 DIAGNOSIS — I252 Old myocardial infarction: Secondary | ICD-10-CM | POA: Insufficient documentation

## 2014-03-17 DIAGNOSIS — I2510 Atherosclerotic heart disease of native coronary artery without angina pectoris: Secondary | ICD-10-CM | POA: Insufficient documentation

## 2014-03-17 DIAGNOSIS — Z8673 Personal history of transient ischemic attack (TIA), and cerebral infarction without residual deficits: Secondary | ICD-10-CM | POA: Diagnosis not present

## 2014-03-17 DIAGNOSIS — I129 Hypertensive chronic kidney disease with stage 1 through stage 4 chronic kidney disease, or unspecified chronic kidney disease: Secondary | ICD-10-CM | POA: Insufficient documentation

## 2014-03-17 DIAGNOSIS — Z7982 Long term (current) use of aspirin: Secondary | ICD-10-CM | POA: Insufficient documentation

## 2014-03-17 DIAGNOSIS — E785 Hyperlipidemia, unspecified: Secondary | ICD-10-CM | POA: Diagnosis not present

## 2014-03-17 DIAGNOSIS — Z87891 Personal history of nicotine dependence: Secondary | ICD-10-CM | POA: Insufficient documentation

## 2014-03-17 DIAGNOSIS — E039 Hypothyroidism, unspecified: Secondary | ICD-10-CM | POA: Diagnosis not present

## 2014-03-17 DIAGNOSIS — I5032 Chronic diastolic (congestive) heart failure: Secondary | ICD-10-CM | POA: Diagnosis not present

## 2014-03-17 DIAGNOSIS — Z862 Personal history of diseases of the blood and blood-forming organs and certain disorders involving the immune mechanism: Secondary | ICD-10-CM | POA: Diagnosis not present

## 2014-03-17 DIAGNOSIS — M7989 Other specified soft tissue disorders: Secondary | ICD-10-CM | POA: Diagnosis present

## 2014-03-17 DIAGNOSIS — T82398A Other mechanical complication of other vascular grafts, initial encounter: Secondary | ICD-10-CM | POA: Diagnosis not present

## 2014-03-17 DIAGNOSIS — K219 Gastro-esophageal reflux disease without esophagitis: Secondary | ICD-10-CM | POA: Diagnosis not present

## 2014-03-17 DIAGNOSIS — Z79899 Other long term (current) drug therapy: Secondary | ICD-10-CM | POA: Diagnosis not present

## 2014-03-17 DIAGNOSIS — Z833 Family history of diabetes mellitus: Secondary | ICD-10-CM | POA: Insufficient documentation

## 2014-03-17 DIAGNOSIS — T82898A Other specified complication of vascular prosthetic devices, implants and grafts, initial encounter: Secondary | ICD-10-CM

## 2014-03-17 DIAGNOSIS — Z8249 Family history of ischemic heart disease and other diseases of the circulatory system: Secondary | ICD-10-CM | POA: Insufficient documentation

## 2014-03-17 DIAGNOSIS — E119 Type 2 diabetes mellitus without complications: Secondary | ICD-10-CM | POA: Insufficient documentation

## 2014-03-17 DIAGNOSIS — M109 Gout, unspecified: Secondary | ICD-10-CM | POA: Diagnosis not present

## 2014-03-17 HISTORY — PX: SHUNTOGRAM: SHX5491

## 2014-03-17 LAB — POCT I-STAT, CHEM 8
BUN: 59 mg/dL — ABNORMAL HIGH (ref 6–23)
CALCIUM ION: 1.19 mmol/L (ref 1.13–1.30)
CHLORIDE: 103 meq/L (ref 96–112)
CREATININE: 2.6 mg/dL — AB (ref 0.50–1.10)
Glucose, Bld: 120 mg/dL — ABNORMAL HIGH (ref 70–99)
HCT: 39 % (ref 36.0–46.0)
Hemoglobin: 13.3 g/dL (ref 12.0–15.0)
Potassium: 5.3 mmol/L — ABNORMAL HIGH (ref 3.5–5.1)
SODIUM: 140 mmol/L (ref 135–145)
TCO2: 26 mmol/L (ref 0–100)

## 2014-03-17 LAB — GLUCOSE, CAPILLARY: GLUCOSE-CAPILLARY: 110 mg/dL — AB (ref 70–99)

## 2014-03-17 SURGERY — ASSESSMENT, SHUNT FUNCTION, WITH CONTRAST RADIOGRAPHIC STUDY
Anesthesia: LOCAL

## 2014-03-17 MED ORDER — ACETAMINOPHEN 325 MG PO TABS: 650.0000 mg | ORAL_TABLET | ORAL | Status: DC | PRN

## 2014-03-17 MED ORDER — HEPARIN (PORCINE) IN NACL 2-0.9 UNIT/ML-% IJ SOLN
INTRAMUSCULAR | Status: AC
  Filled 2014-03-17: qty 500

## 2014-03-17 NOTE — H&P (Addendum)
   Brief History and Physical  History of Present Illness  [name] is a 80 y.o. female who presents with chief complaint: right arm and facial swelling in setting of recent RUA looped AVG (12/24/13).  The patient presents today for R arm shuntogram, possible intervention.    Past Medical History  Diagnosis Date  . Coronary artery disease     a. Cath 09/2010 - med rx.  . TIA (transient ischemic attack)   . Hypertension   . Shingles   . Stroke   . Anemia   . Cardiomyopathy, idiopathic 02/08/2011  . Myocardial infarct     x 3 unsure of years  . Irregular heartbeat   . Ulcer   . GERD (gastroesophageal reflux disease)   . Hx of transient ischemic attack (TIA)   . Memory loss   . Chronic diastolic CHF (congestive heart failure)     Takes Lasix  . Gout     takes allopurinol  . Peripheral vascular disease     a. s/p L BKA.  . Diabetes mellitus     type 2 NIDDM  . Chronic kidney disease (CKD), stage IV (severe)   . Arthritis   . Hypothyroidism     (SEVERE) Takes Levothryroxine  . Nonischemic cardiomyopathy     EF now is 55%, reduced due to myxedema, which is improved.  . Dyslipidemia   . Peripheral neuropathy   . H/O echocardiogram 09/06/11    Indication- nonIschemic Cardiomyopathy. EF = now greater than 55% with no regional wall motion abnormalities. Tthere is mild to moderate trisuspid regurgitayion and mild pulmonary hypertension with an RVSP of 35 mmHg as well as stage 1 diastolic dysfunction and mild to moderate LVH.  . Abnormal nuclear stress test 06/01/09    Demonstrated a new area of infarct scar, peri-infarct ischemia seen in the inferolateral territory. EF eas normal at 70% with mild hypocontractility at the apex, distal inferolateral wall.    Past Surgical History  Procedure Laterality Date  . Back surgery      [facility]  . Eye surgery      Left eye surgery; cataract removal  . Left bka  07/05/2011  . Av fistula placement    . Amputation   07/05/2011    Procedure: AMPUTATION BELOW KNEE;  Surgeon: Christopher S Dickson, MD;  Location: [facility] OR;  Service: Vascular;  Laterality: Left;  . Angioplasty  1988  . Cardiac catheterization  09/28/07    Demonstrated multiple sequential lesions around 40 to 30% in the RCA territory.  . Left lower extremity venous duplex Left 06/27/11    Summary: No evidence of DVT involving the left lower extremity and right common femoral vein.   . Lower extremity arterial evaluation  06/27/11    SUMMARY: Right: ABI not ascertained due to false elevation in BP secondary to calcification (posterior tibial artery is non compressible). Left: ABI indicates moderate reduction in arterial flow. Bilateral: Great toe PPG waveforms indicate adequate perfusion. Great toe pressures not obtained due to patient's movements secondary to pain.  . Duplex doppler  05/10/11    LE arterial dopplers demonstrate bilaterally reduced ABIs of 0.91 on right & 0.56 on left. She does report some decreased pain on the left, & there's moderate mixed-density plaque in the right SFA w/50 to 69% reduction. There's a 69% reduction in the left SFA & does appear to be occlusive disease of left posterior tibial artery. Right posterior dorsalis pedis artery demonstrates occlusive disease  .   Insertion of dialysis catheter N/A 01/06/2013    Procedure: INSERTION OF DIALYSIS CATHETER;  Surgeon: Todd F Early, MD;  Location: [facility] OR;  Service: Vascular;  Laterality: N/A;  . Av fistula placement Right 12/24/2013    Procedure: INSERTION OF ARTERIOVENOUS (AV) GORE-TEX GRAFT ARM;  Surgeon: Christopher S Dickson, MD;  Location: [facility] OR;  Service: Vascular;  Laterality: Right;  . Lower extremity angiogram N/A 10/31/2011    Procedure: LOWER EXTREMITY ANGIOGRAM;  Surgeon: Christopher S Dickson, MD;  Location: [facility] CATH LAB;  Service: Cardiovascular;  Laterality: N/A;    History   Social History  . Marital Status: Widowed    Spouse Name: N/A    Number of Children: 4  .  Years of Education: N/A   Occupational History  . homemaker    Social History Main Topics  . Smoking status: Former Smoker -- 0.50 packs/day for 40 years    Quit date: 07/05/1986  . Smokeless tobacco: Former User     Comment: quit smoking 1988  . Alcohol Use: No  . Drug Use: No  . Sexual Activity: No   Other Topics Concern  . Not on file   Social History Narrative    Family History  Problem Relation Age of Onset  . Cancer Sister     STOMACH  . Diabetes Sister   . Cancer Brother     BONE  . Diabetes Brother   . Anesthesia problems Neg Hx   . Hypotension Neg Hx   . Malignant hyperthermia Neg Hx   . Pseudochol deficiency Neg Hx   . Hyperlipidemia Daughter   . Hypertension Daughter   . Heart disease Daughter     before age 60  . Kidney disease Daughter   . Other Daughter     varicose veins  . Diabetes Daughter   . Heart disease Son     before age 60  . Hyperlipidemia Son   . Hypertension Son   . Heart attack Son   . Heart attack Daughter   . Heart disease Daughter     Before age 60    No current facility-administered medications on file prior to encounter.   Current Outpatient Prescriptions on File Prior to Encounter  Medication Sig Dispense Refill  . acetaminophen (TYLENOL) 325 MG tablet Take 650 mg by mouth every 6 (six) hours as needed for mild pain.    . allopurinol (ZYLOPRIM) 100 MG tablet Take 200 mg by mouth daily.     . amLODipine (NORVASC) 10 MG tablet Take 10 mg by mouth daily.     . aspirin EC 81 MG tablet Take 81 mg by mouth daily.    . calcitRIOL (ROCALTROL) 0.25 MCG capsule Take 0.25 mcg by mouth every other day.     . carvedilol (COREG) 6.25 MG tablet Take 6.25 mg by mouth 2 (two) times daily with a meal.    . clopidogrel (PLAVIX) 75 MG tablet Take 75 mg by mouth daily.    . furosemide (LASIX) 80 MG tablet Take 80-160 mg by mouth 2 (two) times daily. Take 2 tablets in the morning and 1 tablet in the afternoon    . gabapentin (NEURONTIN) 300 MG  capsule Take 300 mg by mouth 3 (three) times daily.    . insulin glargine (LANTUS) 100 UNIT/ML injection Inject 0.22 mLs (22 Units total) into the skin daily. 10 mL 11  . isosorbide mononitrate (IMDUR) 60 MG 24 hr tablet Take 60 mg by mouth daily.    .   levothyroxine (SYNTHROID, LEVOTHROID) 75 MCG tablet Take 75 mcg by mouth daily before breakfast.    . multivitamin (RENA-VIT) TABS tablet Take 1 tablet by mouth at bedtime.  0  . ONGLYZA 5 MG TABS tablet Take 5 mg by mouth daily.     . oxyCODONE-acetaminophen (ROXICET) 5-325 MG per tablet Take 1 tablet by mouth every 8 (eight) hours as needed for severe pain. 20 tablet 0  . pravastatin (PRAVACHOL) 40 MG tablet Take 40 mg by mouth daily.    . RENVELA 800 MG tablet Take 800 mg by mouth 3 (three) times daily with meals.     . travoprost, benzalkonium, (TRAVATAN) 0.004 % ophthalmic solution Place 1 drop into both eyes at bedtime.     . docusate sodium (COLACE) 100 MG capsule Take 100 mg by mouth daily as needed for mild constipation.    . feeding supplement, GLUCERNA SHAKE, (GLUCERNA SHAKE) LIQD Take 237 mLs by mouth daily as needed (PLease offer to pt if meal intake is less than 50%).  0  . HYDROcodone-acetaminophen (NORCO/VICODIN) 5-325 MG per tablet Take 1 tablet by mouth every 12 (twelve) hours as needed for severe pain. (Patient not taking: Reported on 03/02/2014) 30 tablet 0  . ipratropium-albuterol (DUONEB) 0.5-2.5 (3) MG/3ML SOLN Take 3 mLs by nebulization every 6 (six) hours as needed (for shortness of breath).    . nitroGLYCERIN (NITROSTAT) 0.4 MG SL tablet Place 0.4 mg under the tongue every 5 (five) minutes as needed for chest pain.       Allergies  Allergen Reactions  . Aspirin Nausea And Vomiting    325 mg (adult strength) [name]     Review of Systems: As listed above, otherwise negative.  Physical Examination  Filed Vitals:   03/17/14 1152  BP: 139/76  Pulse: 71    Temp: 97.5 F (36.4 C)  TempSrc: Oral  Resp: 18  Height: 4' 9" (1.448 m)  Weight: 127 lb (57.607 kg)  SpO2: 95%    General: A&O x 3, WDWN, swollen face  Pulmonary: Sym exp, good air movt, CTAB, no rales, rhonchi, & wheezing  Cardiac: RRR, Nl S1, S2, no Murmurs, rubs or gallops  Gastrointestinal: soft, NTND, -G/R, - HSM, - masses, - CVAT B  Musculoskeletal: M/S 5/5 throughout , Extremities without ischemic changes , R arm swollen, with faintly palpable thrill  Laboratory See iStat  Medical Decision Making  Cassundra G Gerard is a 80 y.o. female who presents with: likely central venous stenosis vs occlusion s/p RUA looped AVG.   The patient is scheduled for: R arm shuntogram, possible intervention.  I discussed with the patient the nature of angiographic procedures, especially the limited patencies of any endovascular intervention.  The patient is aware of that the risks of an angiographic procedure include but are not limited to: bleeding, infection, access site complications, renal failure, embolization, rupture of vessel, dissection, possible need for emergent surgical intervention, possible need for surgical procedures to treat the patient's pathology, and stroke and death.    The patient is aware of the risks and agrees to proceed.  Brian Chen, MD [facility] Office: [phone] Pager: [phone]  03/17/2014, 1:21 PM    

## 2014-03-17 NOTE — Discharge Instructions (Signed)
Fistulogram, Care After °Refer to this sheet in the next few weeks. These instructions provide you with information on caring for yourself after your procedure. Your health care provider may also give you more specific instructions. Your treatment has been planned according to current medical practices, but problems sometimes occur. Call your health care provider if you have any problems or questions after your procedure. °WHAT TO EXPECT AFTER THE PROCEDURE °After your procedure, it is typical to have the following: °· A small amount of discomfort in the area where the catheters were placed. °· A small amount of bruising around the fistula. °· Sleepiness and fatigue. °HOME CARE INSTRUCTIONS °· Rest at home for the day following your procedure. °· Do not drive or operate heavy machinery while taking pain medicine. °· Take medicines only as directed by your health care provider. °· Do not take baths, swim, or use a hot tub until your health care provider approves. You may shower 24 hours after the procedure or as directed by your health care provider. °· There are many different ways to close and cover an incision, including stitches, skin glue, and adhesive strips. Follow your health care provider's instructions on: °¨ Incision care. °¨ Bandage (dressing) changes and removal. °¨ Incision closure removal. °· Monitor your dialysis fistula carefully. °SEEK MEDICAL CARE IF: °· You have drainage, redness, swelling, or pain at your catheter site. °· You have a fever. °· You have chills. °SEEK IMMEDIATE MEDICAL CARE IF: °· You feel weak. °· You have trouble balancing. °· You have trouble moving your arms or legs. °· You have problems with your speech or vision. °· You can no longer feel a vibration or buzz when you put your fingers over your dialysis fistula. °· The limb that was used for the procedure: °¨ Swells. °¨ Is painful. °¨ Is cold. °¨ Is discolored, such as blue or pale white. °Document Released: 07/22/2013  Document Reviewed: 04/26/2013 °[employer]® Patient Information ©2015 [employer]. This information is not intended to replace advice given to you by your health care provider. Make sure you discuss any questions you have with your health care provider. ° °

## 2014-03-17 NOTE — Telephone Encounter (Signed)
Rx refill sent to patient pharmacy   

## 2014-03-17 NOTE — Progress Notes (Signed)
Discharge instruction given per MD  Order.  Pt denies any discomfort at this time.  Pt to car via wheelchair,

## 2014-03-17 NOTE — Op Note (Signed)
    OPERATIVE NOTE   PROCEDURE: 1. right upper arm looped arteriovenous graft cannulation under ultrasound guidance 2. right arm shuntogram  PRE-OPERATIVE DIAGNOSIS: Right arm and facial swelling  POST-OPERATIVE DIAGNOSIS: same as above   SURGEON: Leonides Sake, MD  ANESTHESIA: local  ESTIMATED BLOOD LOSS: 5 cc  FINDING(S): 1. Central venous occlusion with neck and chest collaterals  SPECIMEN(S):  None  CONTRAST: 11 cc  INDICATIONS: [name] is a 80 y.o. female who  presents with right arm and facial swelling s/p right upper arm looped arteriovenous graft.  The patient is scheduled for right arm shuntogram.  The patient is aware the risks include but are not limited to: bleeding, infection, thrombosis of the cannulated access, and possible anaphylactic reaction to the contrast.  The patient is aware of the risks of the procedure and elects to proceed forward.  DESCRIPTION: After full informed written consent was obtained, the patient was brought back to the angiography suite and placed supine upon the angiography table.  The patient was connected to monitoring equipment.  The right upper arm was prepped and draped in the standard fashion for a right shuntogram.  Under ultrasound guidance, the right upper arm looped arteriovenous graft was cannulated with a micropuncture needle.  The microwire was advanced into the fistula and the needle was exchanged for the a microsheath, which was lodged 2 cm into the access.  The wire was removed and the sheath was connected to the IV extension tubing.  Hand injections were completed to image the access from the antecubitum up to the level of axilla.  The central venous structures were also imaged by hand injections.  Based on the images, this patient will need: ligation of the right upper arm arteriovenous graft.  A 4-0 Monocryl purse-string suture was sewn around the sheath.  The sheath was removed while tying down the suture.  A sterile  bandage was applied to the puncture site.  COMPLICATIONS: none  CONDITION: stable  Leonides Sake, MD [facility] Office: [phone] Pager: [phone]  03/17/2014 2:04 PM

## 2014-03-19 ENCOUNTER — Other Ambulatory Visit: Payer: Self-pay | Admitting: Internal Medicine

## 2014-03-19 ENCOUNTER — Other Ambulatory Visit: Payer: Self-pay

## 2014-03-19 DIAGNOSIS — N63 Unspecified lump in unspecified breast: Secondary | ICD-10-CM

## 2014-03-20 ENCOUNTER — Other Ambulatory Visit: Payer: Self-pay

## 2014-03-24 ENCOUNTER — Encounter (HOSPITAL_COMMUNITY): Payer: Self-pay | Admitting: *Deleted

## 2014-03-24 MED ORDER — CEFUROXIME SODIUM 1.5 G IJ SOLR
1.5000 g | INTRAMUSCULAR | Status: AC
  Administered 2014-03-25: 1.5 g via INTRAVENOUS

## 2014-03-24 MED ORDER — CHLORHEXIDINE GLUCONATE CLOTH 2 % EX PADS: 6.0000 | MEDICATED_PAD | Freq: Once | CUTANEOUS | Status: DC

## 2014-03-24 MED ORDER — SODIUM CHLORIDE 0.9 % IV SOLN
INTRAVENOUS | Status: DC
  Administered 2014-03-25: 08:00:00 via INTRAVENOUS

## 2014-03-24 NOTE — Progress Notes (Signed)
Called pt for pre-op call and she requested that I talk with her son. He was not there at the time but he called back when he got the message. His name is Juliane Lack and he verified allergies, meds, medical and surgical history. I gave him pre-op instructions of time of arrival of 7:00 AM for tomorrow, NPO after MN tonight, pt to take Allopurinol, Amlodipine, Aspirin, Carvedilol, Gabapentin, Imdur, Synthroid, Tylenol - if needed, NTG - if needed. He voiced understanding.

## 2014-03-25 ENCOUNTER — Ambulatory Visit (HOSPITAL_COMMUNITY)
Admission: RE | Admit: 2014-03-25 | Discharge: 2014-03-25 | Disposition: A | Discharge status: Home / Self Care | Payer: Medicare Other | Source: Ambulatory Visit | Attending: Vascular Surgery | Admitting: Vascular Surgery

## 2014-03-25 ENCOUNTER — Ambulatory Visit (HOSPITAL_COMMUNITY): Payer: Medicare Other | Admitting: Anesthesiology

## 2014-03-25 ENCOUNTER — Encounter (HOSPITAL_COMMUNITY)
Admission: RE | Disposition: A | Discharge status: Home / Self Care | Payer: Self-pay | Source: Ambulatory Visit | Attending: Vascular Surgery

## 2014-03-25 DIAGNOSIS — Z7982 Long term (current) use of aspirin: Secondary | ICD-10-CM | POA: Insufficient documentation

## 2014-03-25 DIAGNOSIS — E119 Type 2 diabetes mellitus without complications: Secondary | ICD-10-CM | POA: Insufficient documentation

## 2014-03-25 DIAGNOSIS — Z8673 Personal history of transient ischemic attack (TIA), and cerebral infarction without residual deficits: Secondary | ICD-10-CM | POA: Diagnosis not present

## 2014-03-25 DIAGNOSIS — M109 Gout, unspecified: Secondary | ICD-10-CM | POA: Diagnosis not present

## 2014-03-25 DIAGNOSIS — K219 Gastro-esophageal reflux disease without esophagitis: Secondary | ICD-10-CM | POA: Insufficient documentation

## 2014-03-25 DIAGNOSIS — E039 Hypothyroidism, unspecified: Secondary | ICD-10-CM | POA: Diagnosis not present

## 2014-03-25 DIAGNOSIS — Z87891 Personal history of nicotine dependence: Secondary | ICD-10-CM | POA: Diagnosis not present

## 2014-03-25 DIAGNOSIS — E785 Hyperlipidemia, unspecified: Secondary | ICD-10-CM | POA: Diagnosis not present

## 2014-03-25 DIAGNOSIS — Z8249 Family history of ischemic heart disease and other diseases of the circulatory system: Secondary | ICD-10-CM | POA: Diagnosis not present

## 2014-03-25 DIAGNOSIS — I429 Cardiomyopathy, unspecified: Secondary | ICD-10-CM | POA: Diagnosis not present

## 2014-03-25 DIAGNOSIS — I272 Other secondary pulmonary hypertension: Secondary | ICD-10-CM | POA: Diagnosis not present

## 2014-03-25 DIAGNOSIS — I129 Hypertensive chronic kidney disease with stage 1 through stage 4 chronic kidney disease, or unspecified chronic kidney disease: Secondary | ICD-10-CM | POA: Diagnosis not present

## 2014-03-25 DIAGNOSIS — I5032 Chronic diastolic (congestive) heart failure: Secondary | ICD-10-CM | POA: Diagnosis not present

## 2014-03-25 DIAGNOSIS — I2510 Atherosclerotic heart disease of native coronary artery without angina pectoris: Secondary | ICD-10-CM | POA: Insufficient documentation

## 2014-03-25 DIAGNOSIS — Z794 Long term (current) use of insulin: Secondary | ICD-10-CM | POA: Insufficient documentation

## 2014-03-25 DIAGNOSIS — D649 Anemia, unspecified: Secondary | ICD-10-CM | POA: Diagnosis not present

## 2014-03-25 DIAGNOSIS — Z89512 Acquired absence of left leg below knee: Secondary | ICD-10-CM | POA: Insufficient documentation

## 2014-03-25 DIAGNOSIS — I739 Peripheral vascular disease, unspecified: Secondary | ICD-10-CM | POA: Insufficient documentation

## 2014-03-25 DIAGNOSIS — T82898A Other specified complication of vascular prosthetic devices, implants and grafts, initial encounter: Secondary | ICD-10-CM

## 2014-03-25 HISTORY — PX: LIGATION ARTERIOVENOUS GORTEX GRAFT: SHX5947

## 2014-03-25 LAB — POCT I-STAT 4, (NA,K, GLUC, HGB,HCT)
Glucose, Bld: 124 mg/dL — ABNORMAL HIGH (ref 70–99)
HCT: 34 % — ABNORMAL LOW (ref 36.0–46.0)
Hemoglobin: 11.6 g/dL — ABNORMAL LOW (ref 12.0–15.0)
Potassium: 4 mmol/L (ref 3.5–5.1)
Sodium: 140 mmol/L (ref 135–145)

## 2014-03-25 LAB — GLUCOSE, CAPILLARY
GLUCOSE-CAPILLARY: 119 mg/dL — AB (ref 70–99)
GLUCOSE-CAPILLARY: 107 mg/dL — AB (ref 70–99)
Glucose-Capillary: 118 mg/dL — ABNORMAL HIGH (ref 70–99)

## 2014-03-25 SURGERY — LIGATION ARTERIOVENOUS GORTEX GRAFT
Anesthesia: Monitor Anesthesia Care | Site: Arm Upper | Laterality: Right

## 2014-03-25 MED ORDER — LIDOCAINE-EPINEPHRINE (PF) 1 %-1:200000 IJ SOLN
INTRAMUSCULAR | Status: DC | PRN
  Administered 2014-03-25: 4 mL

## 2014-03-25 MED ORDER — ONDANSETRON HCL 4 MG/2ML IJ SOLN: 4.0000 mg | Freq: Four times a day (QID) | INTRAMUSCULAR | Status: DC | PRN

## 2014-03-25 MED ORDER — DEXTROSE 5 % IV SOLN
INTRAVENOUS | Status: AC
  Filled 2014-03-25: qty 1.5

## 2014-03-25 MED ORDER — PROPOFOL INFUSION 10 MG/ML OPTIME
INTRAVENOUS | Status: DC | PRN
  Administered 2014-03-25: 50 ug/kg/min via INTRAVENOUS

## 2014-03-25 MED ORDER — FENTANYL CITRATE 0.05 MG/ML IJ SOLN: 25.0000 ug | INTRAMUSCULAR | Status: DC | PRN

## 2014-03-25 MED ORDER — FENTANYL CITRATE 0.05 MG/ML IJ SOLN
INTRAMUSCULAR | Status: AC
  Filled 2014-03-25: qty 5

## 2014-03-25 MED ORDER — FENTANYL CITRATE 0.05 MG/ML IJ SOLN
INTRAMUSCULAR | Status: DC | PRN
  Administered 2014-03-25 (×2): 25 ug via INTRAVENOUS

## 2014-03-25 MED ORDER — OXYCODONE-ACETAMINOPHEN 5-325 MG PO TABS: 1.0000 | ORAL_TABLET | ORAL | Status: DC | PRN

## 2014-03-25 MED ORDER — PROPOFOL 10 MG/ML IV BOLUS
INTRAVENOUS | Status: DC | PRN
  Administered 2014-03-25: 10 mg via INTRAVENOUS

## 2014-03-25 MED ORDER — MIDAZOLAM HCL 2 MG/2ML IJ SOLN
INTRAMUSCULAR | Status: AC
  Filled 2014-03-25: qty 2

## 2014-03-25 MED ORDER — SODIUM CHLORIDE 0.9 % IV SOLN: INTRAVENOUS | Status: DC

## 2014-03-25 MED ORDER — LIDOCAINE-EPINEPHRINE (PF) 1 %-1:200000 IJ SOLN
INTRAMUSCULAR | Status: AC
  Filled 2014-03-25: qty 10

## 2014-03-25 SURGICAL SUPPLY — 34 items
CANISTER SUCTION 2500CC (MISCELLANEOUS) ×3 IMPLANT
COVER SURGICAL LIGHT HANDLE (MISCELLANEOUS) ×3 IMPLANT
ELECT REM PT RETURN 9FT ADLT (ELECTROSURGICAL) ×3
ELECTRODE REM PT RTRN 9FT ADLT (ELECTROSURGICAL) ×1 IMPLANT
GAUZE SPONGE 4X4 12PLY STRL (GAUZE/BANDAGES/DRESSINGS) ×1 IMPLANT
GEL ULTRASOUND 20GR AQUASONIC (MISCELLANEOUS) IMPLANT
GLOVE BIO SURGEON STRL SZ7.5 (GLOVE) ×3 IMPLANT
GLOVE BIOGEL PI IND STRL 6.5 (GLOVE) IMPLANT
GLOVE BIOGEL PI IND STRL 8 (GLOVE) ×1 IMPLANT
GLOVE BIOGEL PI INDICATOR 6.5 (GLOVE) ×4
GLOVE BIOGEL PI INDICATOR 8 (GLOVE) ×2
GLOVE SURG SS PI 7.0 STRL IVOR (GLOVE) ×2 IMPLANT
GOWN STRL REUS W/ TWL LRG LVL3 (GOWN DISPOSABLE) ×3 IMPLANT
GOWN STRL REUS W/ TWL XL LVL3 (GOWN DISPOSABLE) IMPLANT
GOWN STRL REUS W/TWL LRG LVL3 (GOWN DISPOSABLE) ×6
GOWN STRL REUS W/TWL XL LVL3 (GOWN DISPOSABLE) ×3
KIT BASIN OR (CUSTOM PROCEDURE TRAY) ×3 IMPLANT
KIT ROOM TURNOVER OR (KITS) ×3 IMPLANT
LIQUID BAND (GAUZE/BANDAGES/DRESSINGS) ×3 IMPLANT
NS IRRIG 1000ML POUR BTL (IV SOLUTION) ×1 IMPLANT
PACK CV ACCESS (CUSTOM PROCEDURE TRAY) ×3 IMPLANT
PAD ARMBOARD 7.5X6 YLW CONV (MISCELLANEOUS) ×6 IMPLANT
PROBE PENCIL 8 MHZ STRL DISP (MISCELLANEOUS) IMPLANT
SPONGE SURGIFOAM ABS GEL 100 (HEMOSTASIS) IMPLANT
SUT ETHILON 3 0 PS 1 (SUTURE) IMPLANT
SUT PROLENE 6 0 BV (SUTURE) IMPLANT
SUT SILK 0 TIES 10X30 (SUTURE) ×3 IMPLANT
SUT VIC AB 3-0 SH 27 (SUTURE) ×3
SUT VIC AB 3-0 SH 27X BRD (SUTURE) ×1 IMPLANT
SUT VICRYL 4-0 PS2 18IN ABS (SUTURE) ×3 IMPLANT
SWAB COLLECTION DEVICE MRSA (MISCELLANEOUS) IMPLANT
TUBE ANAEROBIC SPECIMEN COL (MISCELLANEOUS) IMPLANT
UNDERPAD 30X30 INCONTINENT (UNDERPADS AND DIAPERS) ×3 IMPLANT
WATER STERILE IRR 1000ML POUR (IV SOLUTION) ×3 IMPLANT

## 2014-03-25 NOTE — Transfer of Care (Signed)
Immediate Anesthesia Transfer of Care Note  Patient: [name]  Procedure(s) Performed: Procedure(s): LIGATION ARTERIOVENOUS GORTEX GRAFT (Right)  Patient Location: PACU  Anesthesia Type:MAC  Level of Consciousness: awake, alert  and oriented  Airway & Oxygen Therapy: Patient Spontanous Breathing  Post-op Assessment: Report given to PACU RN and Post -op Vital signs reviewed and stable  Post vital signs: Reviewed and stable  Complications: No apparent anesthesia complications

## 2014-03-25 NOTE — Interval H&P Note (Signed)
History and Physical Interval Note:  03/25/2014 9:40 AM  [name]  has presented today for surgery, with the diagnosis of End Stage Renal Disease N18.6  The various methods of treatment have been discussed with the patient and family. After consideration of risks, benefits and other options for treatment, the patient has consented to  Procedure(s): LIGATION ARTERIOVENOUS GORTEX GRAFT (Right) as a surgical intervention .  The patient's history has been reviewed, patient examined, no change in status, stable for surgery.  I have reviewed the patient's chart and labs.  Questions were answered to the patient's satisfaction.     Tita Terhaar S

## 2014-03-25 NOTE — Op Note (Signed)
    NAME: [name]   MRN: [id number] DOB: [birth year]    DATE OF OPERATION: 03/25/2014  PREOP DIAGNOSIS: venous hypertension  POSTOP DIAGNOSIS: same  PROCEDURE: ligation of right upper arm AV graft  SURGEON: Di Kindle. Edilia Bo, MD, FACS  ASSIST: none  ANESTHESIA: local with sedation   EBL: minimal  INDICATIONS: [name] is a 81 y.o. female with significant venous hypertension. She underwent a fistulogram and had central venous occlusion. I was asked to ligate her fistula.  FINDINGS: Significant venous hypertension.  TECHNIQUE: The patient was taken to the operating room and sedated by anesthesia. The right upper extremity was prepped and draped in usual sterile fashion. After the skin was anesthetized with functional lidocaine, a transverse incision was made over the graft. The graft was dissected free and was ligated with 2 2-0 silk ties. Hemostasis was obtained in the wound. The wound was closed with the 3-0 Vicryl and the skin closed with 4-0 Vicryl. Dermabond was applied. The patient tolerated the procedure well and was transferred to the recovery room in stable condition. All needle and sponge counts were correct.  Waverly Ferrari, MD, FACS [facility]  DATE OF DICTATION:   03/25/2014

## 2014-03-25 NOTE — Progress Notes (Signed)
IV bruised when starting it but good blood return, good flow and no swelling noted

## 2014-03-25 NOTE — H&P (View-Only) (Signed)
Brief History and Physical  History of Present Illness  [name] is a 81 y.o. female who presents with chief complaint: right arm and facial swelling in setting of recent RUA looped AVG (12/24/13).  The patient presents today for R arm shuntogram, possible intervention.    Past Medical History  Diagnosis Date  . Coronary artery disease     a. Cath 09/2010 - med rx.  Marland Kitchen. TIA (transient ischemic attack)   . Hypertension   . Shingles   . Stroke   . Anemia   . Cardiomyopathy, idiopathic 02/08/2011  . Myocardial infarct     x 3 unsure of years  . Irregular heartbeat   . Ulcer   . GERD (gastroesophageal reflux disease)   . Hx of transient ischemic attack (TIA)   . Memory loss   . Chronic diastolic CHF (congestive heart failure)     Takes Lasix  . Gout     takes allopurinol  . Peripheral vascular disease     a. s/p L BKA.  . Diabetes mellitus     type 2 NIDDM  . Chronic kidney disease (CKD), stage IV (severe)   . Arthritis   . Hypothyroidism     (SEVERE) Takes Levothryroxine  . Nonischemic cardiomyopathy     EF now is 55%, reduced due to myxedema, which is improved.  . Dyslipidemia   . Peripheral neuropathy   . H/O echocardiogram 09/06/11    Indication- nonIschemic Cardiomyopathy. EF = now greater than 55% with no regional wall motion abnormalities. Tthere is mild to moderate trisuspid regurgitayion and mild pulmonary hypertension with an RVSP of 35 mmHg as well as stage 1 diastolic dysfunction and mild to moderate LVH.  Marland Kitchen. Abnormal nuclear stress test 06/01/09    Demonstrated a new area of infarct scar, peri-infarct ischemia seen in the inferolateral territory. EF eas normal at 70% with mild hypocontractility at the apex, distal inferolateral wall.    Past Surgical History  Procedure Laterality Date  . [facility]  . Eye surgery      Left eye surgery; cataract removal  . Left bka  07/05/2011  . Av fistula placement    . Amputation   07/05/2011    Procedure: AMPUTATION BELOW KNEE;  Surgeon: Chuck Hinthristopher S Dickson, MD;  [facility];  Service: Vascular;  Laterality: Left;  . Angioplasty  1988  . Cardiac catheterization  09/28/07    Demonstrated multiple sequential lesions around 40 to 30% in the RCA territory.  . Left lower extremity venous duplex Left 06/27/11    Summary: No evidence of DVT involving the left lower extremity and right common femoral vein.   . Lower extremity arterial evaluation  06/27/11    SUMMARY: Right: ABI not ascertained due to false elevation in BP secondary to calcification (posterior tibial artery is non compressible). Left: ABI indicates moderate reduction in arterial flow. Bilateral: Great toe PPG waveforms indicate adequate perfusion. Great toe pressures not obtained due to patient's movements secondary to pain.  . Duplex doppler  05/10/11    LE arterial dopplers demonstrate bilaterally reduced ABIs of 0.91 on right & 0.56 on left. She does report some decreased pain on the left, & there's moderate mixed-density plaque in the right SFA w/50 to 69% reduction. There's a 69% reduction in the left SFA & does appear to be occlusive disease of left posterior tibial artery. Right posterior dorsalis pedis artery demonstrates occlusive disease  .  Insertion of dialysis catheter N/A 01/06/2013    Procedure: INSERTION OF DIALYSIS CATHETER;  Surgeon: Larina Earthly, MD;  Location: [facility] OR;  Service: Vascular;  Laterality: N/A;  . Av fistula placement Right 12/24/2013    Procedure: INSERTION OF ARTERIOVENOUS (AV) GORE-TEX GRAFT ARM;  Surgeon: Chuck Hint, MD;  Location: [facility] OR;  Service: Vascular;  Laterality: Right;  . Lower extremity angiogram N/A 10/31/2011    Procedure: LOWER EXTREMITY ANGIOGRAM;  Surgeon: Chuck Hint, MD;  Location: [facility] CATH LAB;  Service: Cardiovascular;  Laterality: N/A;    History   Social History  . Marital Status: Widowed    Spouse Name: N/A    Number of Children: 4  .  Years of Education: N/A   Occupational History  . homemaker    Social History Main Topics  . Smoking status: Former Smoker -- 0.50 packs/day for 40 years    Quit date: 07/05/1986  . Smokeless tobacco: Former Neurosurgeon     Comment: quit smoking 1988  . Alcohol Use: No  . Drug Use: No  . Sexual Activity: No   Other Topics Concern  . Not on file   Social History Narrative    Family History  Problem Relation Age of Onset  . Cancer Sister     STOMACH  . Diabetes Sister   . Cancer Brother     BONE  . Diabetes Brother   . Anesthesia problems Neg Hx   . Hypotension Neg Hx   . Malignant hyperthermia Neg Hx   . Pseudochol deficiency Neg Hx   . Hyperlipidemia Daughter   . Hypertension Daughter   . Heart disease Daughter     before age 73  . Kidney disease Daughter   . Other Daughter     varicose veins  . Diabetes Daughter   . Heart disease Son     before age 20  . Hyperlipidemia Son   . Hypertension Son   . Heart attack Son   . Heart attack Daughter   . Heart disease Daughter     Before age 13    No current facility-administered medications on file prior to encounter.   Current Outpatient Prescriptions on File Prior to Encounter  Medication Sig Dispense Refill  . acetaminophen (TYLENOL) 325 MG tablet Take 650 mg by mouth every 6 (six) hours as needed for mild pain.    Marland Kitchen allopurinol (ZYLOPRIM) 100 MG tablet Take 200 mg by mouth daily.     Marland Kitchen amLODipine (NORVASC) 10 MG tablet Take 10 mg by mouth daily.     Marland Kitchen aspirin EC 81 MG tablet Take 81 mg by mouth daily.    . calcitRIOL (ROCALTROL) 0.25 MCG capsule Take 0.25 mcg by mouth every other day.     . carvedilol (COREG) 6.25 MG tablet Take 6.25 mg by mouth 2 (two) times daily with a meal.    . clopidogrel (PLAVIX) 75 MG tablet Take 75 mg by mouth daily.    . furosemide (LASIX) 80 MG tablet Take 80-160 mg by mouth 2 (two) times daily. Take 2 tablets in the morning and 1 tablet in the afternoon    . gabapentin (NEURONTIN) 300 MG  capsule Take 300 mg by mouth 3 (three) times daily.    . insulin glargine (LANTUS) 100 UNIT/ML injection Inject 0.22 mLs (22 Units total) into the skin daily. 10 mL 11  . isosorbide mononitrate (IMDUR) 60 MG 24 hr tablet Take 60 mg by mouth daily.    Marland Kitchen  levothyroxine (SYNTHROID, LEVOTHROID) 75 MCG tablet Take 75 mcg by mouth daily before breakfast.    . multivitamin (RENA-VIT) TABS tablet Take 1 tablet by mouth at bedtime.  0  . ONGLYZA 5 MG TABS tablet Take 5 mg by mouth daily.     Marland Kitchen oxyCODONE-acetaminophen (ROXICET) 5-325 MG per tablet Take 1 tablet by mouth every 8 (eight) hours as needed for severe pain. 20 tablet 0  . pravastatin (PRAVACHOL) 40 MG tablet Take 40 mg by mouth daily.    Marland Kitchen RENVELA 800 MG tablet Take 800 mg by mouth 3 (three) times daily with meals.     . travoprost, benzalkonium, (TRAVATAN) 0.004 % ophthalmic solution Place 1 drop into both eyes at bedtime.     . docusate sodium (COLACE) 100 MG capsule Take 100 mg by mouth daily as needed for mild constipation.    . feeding supplement, GLUCERNA SHAKE, (GLUCERNA SHAKE) LIQD Take 237 mLs by mouth daily as needed (PLease offer to pt if meal intake is less than 50%).  0  . HYDROcodone-acetaminophen (NORCO/VICODIN) 5-325 MG per tablet Take 1 tablet by mouth every 12 (twelve) hours as needed for severe pain. (Patient not taking: Reported on 03/02/2014) 30 tablet 0  . ipratropium-albuterol (DUONEB) 0.5-2.5 (3) MG/3ML SOLN Take 3 mLs by nebulization every 6 (six) hours as needed (for shortness of breath).    . nitroGLYCERIN (NITROSTAT) 0.4 MG SL tablet Place 0.4 mg under the tongue every 5 (five) minutes as needed for chest pain.       Allergies  Allergen Reactions  . Aspirin Nausea And Vomiting    325 mg (adult strength) [name]     Review of Systems: As listed above, otherwise negative.  Physical Examination  Filed Vitals:   03/17/14 1152  BP: 139/76  Pulse: 71    Temp: 97.5 F (36.4 C)  TempSrc: Oral  Resp: 18  Height:  (1.448 m)  Weight: 127 lb (57.607 kg)  SpO2: 95%    General: A&O x 3, WDWN, swollen face  Pulmonary: Sym exp, good air movt, CTAB, no rales, rhonchi, & wheezing  Cardiac: RRR, Nl S1, S2, no Murmurs, rubs or gallops  Gastrointestinal: soft, NTND, -G/R, - HSM, - masses, - CVAT B  Musculoskeletal: M/S 5/5 throughout , Extremities without ischemic changes , R arm swollen, with faintly palpable thrill  Laboratory See iStat  Medical Decision Making  AVAYA MCJUNKINS is a 81 y.o. female who presents with: likely central venous stenosis vs occlusion s/p RUA looped AVG.   The patient is scheduled for: R arm shuntogram, possible intervention.  I discussed with the patient the nature of angiographic procedures, especially the limited patencies of any endovascular intervention.  The patient is aware of that the risks of an angiographic procedure include but are not limited to: bleeding, infection, access site complications, renal failure, embolization, rupture of vessel, dissection, possible need for emergent surgical intervention, possible need for surgical procedures to treat the patient's pathology, and stroke and death.    The patient is aware of the risks and agrees to proceed.  Leonides Sake, MD [facility] Office: [phone] Pager: [phone]  03/17/2014, 1:21 PM

## 2014-03-25 NOTE — Discharge Instructions (Signed)
General Anesthesia, Adult, Care After  °Refer to this sheet in the next few weeks. These instructions provide you with information on caring for yourself after your procedure. Your health care provider may also give you more specific instructions. Your treatment has been planned according to current medical practices, but problems sometimes occur. Call your health care provider if you have any problems or questions after your procedure.  °WHAT TO EXPECT AFTER THE PROCEDURE  °After the procedure, it is typical to experience:  °Sleepiness.  °Nausea and vomiting. °HOME CARE INSTRUCTIONS  °For the first 24 hours after general anesthesia:  °Have a responsible person with you.  °Do not drive a car. If you are alone, do not take public transportation.  °Do not drink alcohol.  °Do not take medicine that has not been prescribed by your health care provider.  °Do not sign important papers or make important decisions.  °You may resume a normal diet and activities as directed by your health care provider.  °Change bandages (dressings) as directed.  °If you have questions or problems that seem related to general anesthesia, call the hospital and ask for the anesthetist or anesthesiologist on call. °SEEK MEDICAL CARE IF:  °You have nausea and vomiting that continue the day after anesthesia.  °You develop a rash. °SEEK IMMEDIATE MEDICAL CARE IF:  °You have difficulty breathing.  °You have chest pain.  °You have any allergic problems. °Document Released: 06/13/2000 Document Revised: 11/07/2012 Document Reviewed: 09/20/2012  °ExitCare® Patient Information ©2014 [employer].  ° °What to eat: ° °For your first meals, you should eat lightly; only small meals initially.  If you do not have nausea, you may eat larger meals.  Avoid spicy, greasy and heavy food.   ° °Tissue Adhesive Wound Care  ° ° °Some cuts and wounds can be closed with tissue adhesive. Adhesive is like glue. It holds the skin together and helps a wound heal faster.  This adhesive goes away on its own as the wound heals.  °HOME CARE  °Showers are allowed. Do not soak the wound in water. Do not take baths, swim, or use hot tubs. Do not use soaps or creams on your wound.  °If a bandage (dressing) was put on, change it as often as told by your doctor.  °Keep the bandage dry.  °Do not scratch, pick, or rub the adhesive.  °Do not put tape over the adhesive. The adhesive could come off.  °Protect the wound from another injury.  °Protect the wound from sun and tanning beds.  °Only take medicine as told by your doctor.  °Keep all doctor visits as told. °GET HELP RIGHT AWAY IF:  °Your wound is red, puffy (swollen), hot, or tender.  °You get a rash after the glue is put on.  °You have more pain in the wound.  °You have a red streak going away from the wound.  °You have yellowish-white fluid (pus) coming from the wound.  °You have more bleeding.  °You have a fever.  °You have chills and start to shake.  °You notice a bad smell coming from the wound.  °Your wound or adhesive breaks open. °MAKE SURE YOU:  °Understand these instructions.  °Will watch your condition.  °Will get help right away if you are not doing well or get worse. °Document Released: 12/15/2007 Document Revised: 12/26/2012 Document Reviewed: 09/26/2012  °ExitCare® Patient Information ©2015 [employer]. This information is not intended to replace advice given to you by your health care provider.   Make sure you discuss any questions you have with your health care provider.  ° ° °

## 2014-03-25 NOTE — Anesthesia Postprocedure Evaluation (Signed)
Anesthesia Post Note  Patient: [name]  Procedure(s) Performed: Procedure(s) (LRB): LIGATION ARTERIOVENOUS GORTEX GRAFT (Right)  Anesthesia type: MAC  Patient location: PACU  Post pain: Pain level controlled and Adequate analgesia  Post assessment: Post-op Vital signs reviewed, Patient's Cardiovascular Status Stable and Respiratory Function Stable  Last Vitals:  Filed Vitals:   03/25/14 1045  BP: 142/90  Pulse: 61  Temp:   Resp: 6    Post vital signs: Reviewed and stable  Level of consciousness: awake, alert  and oriented  Complications: No apparent anesthesia complications

## 2014-03-25 NOTE — Anesthesia Preprocedure Evaluation (Signed)
Anesthesia Evaluation  Patient identified by MRN, date of birth, ID band Patient awake    Reviewed: Allergy & Precautions, NPO status , Patient's Chart, lab work & pertinent test results  Airway Mallampati: II   Neck ROM: full    Dental   Pulmonary former smoker,  breath sounds clear to auscultation        Cardiovascular hypertension, + CAD, + Past MI, + Peripheral Vascular Disease and +CHF Rhythm:regular Rate:Normal     Neuro/Psych  Neuromuscular disease CVA    GI/Hepatic GERD-  ,  Endo/Other  diabetes, Type 2Hypothyroidism   Renal/GU ESRF and DialysisRenal disease     Musculoskeletal  (+) Arthritis -,   Abdominal   Peds  Hematology   Anesthesia Other Findings   Reproductive/Obstetrics                             Anesthesia Physical Anesthesia Plan  ASA: III  Anesthesia Plan: MAC   Post-op Pain Management:    Induction: Intravenous  Airway Management Planned: Simple Face Mask  Additional Equipment:   Intra-op Plan:   Post-operative Plan:   Informed Consent: I have reviewed the patients History and Physical, chart, labs and discussed the procedure including the risks, benefits and alternatives for the proposed anesthesia with the patient or authorized representative who has indicated his/her understanding and acceptance.     Plan Discussed with: CRNA, Anesthesiologist and Surgeon  Anesthesia Plan Comments:         Anesthesia Quick Evaluation

## 2014-03-26 ENCOUNTER — Ambulatory Visit: Payer: Medicare Other | Admitting: Internal Medicine

## 2014-03-27 ENCOUNTER — Encounter (HOSPITAL_COMMUNITY): Payer: Self-pay | Admitting: Vascular Surgery

## 2014-03-31 ENCOUNTER — Ambulatory Visit
Admission: RE | Admit: 2014-03-31 | Discharge: 2014-03-31 | Disposition: A | Discharge status: Home / Self Care | Payer: Medicare Other | Source: Ambulatory Visit

## 2014-03-31 DIAGNOSIS — N63 Unspecified lump in unspecified breast: Secondary | ICD-10-CM

## 2014-04-13 ENCOUNTER — Encounter (HOSPITAL_COMMUNITY): Payer: Self-pay | Admitting: Emergency Medicine

## 2014-04-13 ENCOUNTER — Emergency Department (HOSPITAL_COMMUNITY): Payer: Medicare Other

## 2014-04-13 ENCOUNTER — Inpatient Hospital Stay (HOSPITAL_COMMUNITY)
Admission: EM | Admit: 2014-04-13 | Discharge: 2014-04-17 | DRG: 153 | Disposition: A | Discharge status: Home Health | Payer: Medicare Other | Attending: Internal Medicine | Admitting: Internal Medicine

## 2014-04-13 DIAGNOSIS — T18128A Food in esophagus causing other injury, initial encounter: Secondary | ICD-10-CM | POA: Insufficient documentation

## 2014-04-13 DIAGNOSIS — M109 Gout, unspecified: Secondary | ICD-10-CM | POA: Diagnosis present

## 2014-04-13 DIAGNOSIS — Z79899 Other long term (current) drug therapy: Secondary | ICD-10-CM | POA: Diagnosis not present

## 2014-04-13 DIAGNOSIS — Z89512 Acquired absence of left leg below knee: Secondary | ICD-10-CM | POA: Diagnosis not present

## 2014-04-13 DIAGNOSIS — E039 Hypothyroidism, unspecified: Secondary | ICD-10-CM | POA: Diagnosis present

## 2014-04-13 DIAGNOSIS — Z87891 Personal history of nicotine dependence: Secondary | ICD-10-CM

## 2014-04-13 DIAGNOSIS — G629 Polyneuropathy, unspecified: Secondary | ICD-10-CM | POA: Diagnosis present

## 2014-04-13 DIAGNOSIS — Z7982 Long term (current) use of aspirin: Secondary | ICD-10-CM

## 2014-04-13 DIAGNOSIS — N63 Unspecified lump in breast: Secondary | ICD-10-CM | POA: Diagnosis present

## 2014-04-13 DIAGNOSIS — D649 Anemia, unspecified: Secondary | ICD-10-CM | POA: Diagnosis present

## 2014-04-13 DIAGNOSIS — N184 Chronic kidney disease, stage 4 (severe): Secondary | ICD-10-CM | POA: Diagnosis present

## 2014-04-13 DIAGNOSIS — K228 Other specified diseases of esophagus: Secondary | ICD-10-CM | POA: Diagnosis present

## 2014-04-13 DIAGNOSIS — M542 Cervicalgia: Secondary | ICD-10-CM

## 2014-04-13 DIAGNOSIS — Z794 Long term (current) use of insulin: Secondary | ICD-10-CM

## 2014-04-13 DIAGNOSIS — R1310 Dysphagia, unspecified: Secondary | ICD-10-CM | POA: Diagnosis present

## 2014-04-13 DIAGNOSIS — K219 Gastro-esophageal reflux disease without esophagitis: Secondary | ICD-10-CM | POA: Diagnosis present

## 2014-04-13 DIAGNOSIS — I2510 Atherosclerotic heart disease of native coronary artery without angina pectoris: Secondary | ICD-10-CM | POA: Diagnosis present

## 2014-04-13 DIAGNOSIS — I252 Old myocardial infarction: Secondary | ICD-10-CM

## 2014-04-13 DIAGNOSIS — I129 Hypertensive chronic kidney disease with stage 1 through stage 4 chronic kidney disease, or unspecified chronic kidney disease: Secondary | ICD-10-CM | POA: Diagnosis present

## 2014-04-13 DIAGNOSIS — E119 Type 2 diabetes mellitus without complications: Secondary | ICD-10-CM | POA: Diagnosis present

## 2014-04-13 DIAGNOSIS — J0510 Acute epiglottitis without obstruction: Principal | ICD-10-CM | POA: Diagnosis present

## 2014-04-13 DIAGNOSIS — Z8673 Personal history of transient ischemic attack (TIA), and cerebral infarction without residual deficits: Secondary | ICD-10-CM | POA: Diagnosis not present

## 2014-04-13 DIAGNOSIS — Z7902 Long term (current) use of antithrombotics/antiplatelets: Secondary | ICD-10-CM | POA: Diagnosis not present

## 2014-04-13 DIAGNOSIS — I429 Cardiomyopathy, unspecified: Secondary | ICD-10-CM | POA: Diagnosis present

## 2014-04-13 DIAGNOSIS — I5032 Chronic diastolic (congestive) heart failure: Secondary | ICD-10-CM | POA: Diagnosis present

## 2014-04-13 DIAGNOSIS — R061 Stridor: Secondary | ICD-10-CM

## 2014-04-13 DIAGNOSIS — K449 Diaphragmatic hernia without obstruction or gangrene: Secondary | ICD-10-CM | POA: Diagnosis present

## 2014-04-13 DIAGNOSIS — E785 Hyperlipidemia, unspecified: Secondary | ICD-10-CM | POA: Diagnosis present

## 2014-04-13 LAB — BASIC METABOLIC PANEL
Anion gap: 5 (ref 5–15)
BUN: 22 mg/dL (ref 6–23)
CO2: 33 mmol/L — ABNORMAL HIGH (ref 19–32)
CREATININE: 2.14 mg/dL — AB (ref 0.50–1.10)
Calcium: 9.4 mg/dL (ref 8.4–10.5)
Chloride: 99 mmol/L (ref 96–112)
GFR calc Af Amer: 22 mL/min — ABNORMAL LOW (ref >90)
GFR, EST NON AFRICAN AMERICAN: 19 mL/min — AB (ref >90)
Glucose, Bld: 126 mg/dL — ABNORMAL HIGH (ref 70–99)
POTASSIUM: 4.6 mmol/L (ref 3.5–5.1)
Sodium: 137 mmol/L (ref 135–145)

## 2014-04-13 LAB — GLUCOSE, CAPILLARY
GLUCOSE-CAPILLARY: 129 mg/dL — AB (ref 70–99)
GLUCOSE-CAPILLARY: 121 mg/dL — AB (ref 70–99)
Glucose-Capillary: 139 mg/dL — ABNORMAL HIGH (ref 70–99)

## 2014-04-13 LAB — CBC WITH DIFFERENTIAL/PLATELET
Basophils Absolute: 0 10*3/uL (ref 0.0–0.1)
Basophils Relative: 0 % (ref 0–1)
EOS ABS: 1.9 10*3/uL — AB (ref 0.0–0.7)
Eosinophils Relative: 15 % — ABNORMAL HIGH (ref 0–5)
HCT: 36.4 % (ref 36.0–46.0)
Hemoglobin: 11.3 g/dL — ABNORMAL LOW (ref 12.0–15.0)
LYMPHS ABS: 1.8 10*3/uL (ref 0.7–4.0)
LYMPHS PCT: 15 % (ref 12–46)
MCH: 27.3 pg (ref 26.0–34.0)
MCHC: 31 g/dL (ref 30.0–36.0)
MCV: 87.9 fL (ref 78.0–100.0)
MONO ABS: 0.6 10*3/uL (ref 0.1–1.0)
Monocytes Relative: 5 % (ref 3–12)
Neutro Abs: 8 10*3/uL — ABNORMAL HIGH (ref 1.7–7.7)
Neutrophils Relative %: 65 % (ref 43–77)
PLATELETS: 334 10*3/uL (ref 150–400)
RBC: 4.14 MIL/uL (ref 3.87–5.11)
RDW: 16.8 % — AB (ref 11.5–15.5)
WBC: 12.3 10*3/uL — ABNORMAL HIGH (ref 4.0–10.5)

## 2014-04-13 LAB — RAPID STREP SCREEN (MED CTR MEBANE ONLY): STREPTOCOCCUS, GROUP A SCREEN (DIRECT): NEGATIVE

## 2014-04-13 LAB — STREP PNEUMONIAE URINARY ANTIGEN: Strep Pneumo Urinary Antigen: NEGATIVE

## 2014-04-13 MED ORDER — ACETAMINOPHEN 650 MG RE SUPP
650.0000 mg | RECTAL | Status: DC | PRN
  Administered 2014-04-13: 650 mg via RECTAL
  Filled 2014-04-13: qty 1

## 2014-04-13 MED ORDER — PIPERACILLIN-TAZOBACTAM 3.375 G IVPB 30 MIN
3.3750 g | INTRAVENOUS | Status: AC
  Administered 2014-04-13: 3.375 g via INTRAVENOUS
  Filled 2014-04-13: qty 50

## 2014-04-13 MED ORDER — ACETAMINOPHEN 160 MG/5ML PO SOLN
650.0000 mg | Freq: Four times a day (QID) | ORAL | Status: DC | PRN
  Filled 2014-04-13: qty 20.3

## 2014-04-13 MED ORDER — CARVEDILOL 6.25 MG PO TABS
6.2500 mg | ORAL_TABLET | Freq: Two times a day (BID) | ORAL | Status: DC
  Filled 2014-04-13: qty 1

## 2014-04-13 MED ORDER — AMLODIPINE BESYLATE 5 MG PO TABS: 5.0000 mg | ORAL_TABLET | Freq: Every day | ORAL | Status: DC

## 2014-04-13 MED ORDER — HEPARIN SODIUM (PORCINE) 5000 UNIT/ML IJ SOLN
5000.0000 [IU] | Freq: Three times a day (TID) | INTRAMUSCULAR | Status: DC
  Administered 2014-04-13 – 2014-04-17 (×12): 5000 [IU] via SUBCUTANEOUS
  Filled 2014-04-13 (×14): qty 1

## 2014-04-13 MED ORDER — GABAPENTIN 250 MG/5ML PO SOLN
100.0000 mg | Freq: Three times a day (TID) | ORAL | Status: DC | PRN
  Filled 2014-04-13: qty 2

## 2014-04-13 MED ORDER — PIPERACILLIN-TAZOBACTAM 3.375 G IVPB
3.3750 g | Freq: Three times a day (TID) | INTRAVENOUS | Status: DC
  Administered 2014-04-13 – 2014-04-14 (×2): 3.375 g via INTRAVENOUS
  Filled 2014-04-13 (×4): qty 50

## 2014-04-13 MED ORDER — HYDRALAZINE HCL 20 MG/ML IJ SOLN
5.0000 mg | Freq: Four times a day (QID) | INTRAMUSCULAR | Status: DC | PRN
  Administered 2014-04-13: 5 mg via INTRAVENOUS
  Filled 2014-04-13 (×2): qty 1

## 2014-04-13 MED ORDER — INSULIN ASPART 100 UNIT/ML ~~LOC~~ SOLN
0.0000 [IU] | Freq: Three times a day (TID) | SUBCUTANEOUS | Status: DC
  Administered 2014-04-14: 1 [IU] via SUBCUTANEOUS

## 2014-04-13 MED ORDER — ACETAMINOPHEN 10 MG/ML IV SOLN: 1000.0000 mg | Freq: Once | INTRAVENOUS | Status: DC

## 2014-04-13 MED ORDER — INSULIN ASPART 100 UNIT/ML ~~LOC~~ SOLN: 0.0000 [IU] | Freq: Three times a day (TID) | SUBCUTANEOUS | Status: DC

## 2014-04-13 MED ORDER — SODIUM CHLORIDE 0.9 % IV SOLN: 250.0000 mL | INTRAVENOUS | Status: DC | PRN

## 2014-04-13 MED ORDER — MORPHINE SULFATE 2 MG/ML IJ SOLN
1.0000 mg | INTRAMUSCULAR | Status: DC | PRN
  Administered 2014-04-13 – 2014-04-15 (×5): 1 mg via INTRAVENOUS
  Filled 2014-04-13 (×5): qty 1

## 2014-04-13 NOTE — ED Notes (Signed)
Pt to ED via [facility] from home, with c/o difficulty swallowing due to sore throat- pt drooling, states hurts to talk and breath. Airway clear and patent. Pt suctioned for sm amount of sputum.

## 2014-04-13 NOTE — ED Notes (Signed)
Dr. Lazarus Salines in to see pt, using fiberoptic scope to examine.

## 2014-04-13 NOTE — ED Provider Notes (Signed)
CSN: [id number]     Arrival date & time 04/13/14  1101 History   First MD Initiated Contact with Patient 04/13/14 1116     Chief Complaint  Patient presents with  . something in throat   . Sore Throat  . Arm Pain     (Consider location/radiation/quality/duration/timing/severity/associated sxs/prior Treatment) HPI  81 year old female presents with left sided neck pain and sore throat since yesterday. She's been having drooling and trouble swallowing because it hurts. It hurts to breathe as well. She was drinking coffee this morning and was able to keep down a small amount. She's not had any fevers or chills. No chest pain. No shortness of breath, just painful breathing. Throat/neck symptoms seem to be stable since onset yesterday, no worsening. She also chronically has right arm swelling that is not worse than normal. She had a right AV fistula placed that was then ligated due to obstruction. Arm is painful but not worse than normal.  Past Medical History  Diagnosis Date  . Coronary artery disease     a. Cath 09/2010 - med rx.  Marland Kitchen TIA (transient ischemic attack)   . Hypertension   . Shingles   . Stroke   . Anemia   . Cardiomyopathy, idiopathic 02/08/2011  . Myocardial infarct     x 3 unsure of years  . Irregular heartbeat   . Ulcer   . GERD (gastroesophageal reflux disease)   . Hx of transient ischemic attack (TIA)   . Memory loss   . Chronic diastolic CHF (congestive heart failure)     Takes Lasix  . Gout     takes allopurinol  . Peripheral vascular disease     a. s/p L BKA.  . Diabetes mellitus     type 2 NIDDM  . Chronic kidney disease (CKD), stage IV (severe)   . Arthritis   . Hypothyroidism     (SEVERE) Takes Levothryroxine  . Nonischemic cardiomyopathy     EF now is 55%, reduced due to myxedema, which is improved.  . Dyslipidemia   . Peripheral neuropathy   . H/O echocardiogram 09/06/11    Indication- nonIschemic Cardiomyopathy. EF = now greater than 55% with no  regional wall motion abnormalities. Tthere is mild to moderate trisuspid regurgitayion and mild pulmonary hypertension with an RVSP of 35 mmHg as well as stage 1 diastolic dysfunction and mild to moderate LVH.  Marland Kitchen Abnormal nuclear stress test 06/01/09    Demonstrated a new area of infarct scar, peri-infarct ischemia seen in the inferolateral territory. EF eas normal at 70% with mild hypocontractility at the apex, distal inferolateral wall.   Past Surgical History  Procedure Laterality Date  . Back surgery      [facility]  . Eye surgery      Left eye surgery; cataract removal  . Left bka  07/05/2011  . Av fistula placement    . Amputation  07/05/2011    Procedure: AMPUTATION BELOW KNEE;  Surgeon: Chuck Hint, MD;  Location: [facility] OR;  Service: Vascular;  Laterality: Left;  . Angioplasty  1988  . Cardiac catheterization  09/28/07    Demonstrated multiple sequential lesions around 40 to 30% in the RCA territory.  . Left lower extremity venous duplex Left 06/27/11    Summary: No evidence of DVT involving the left lower extremity and right common femoral vein.   . Lower extremity arterial evaluation  06/27/11    SUMMARY: Right: ABI not ascertained due to false elevation in BP  secondary to calcification (posterior tibial artery is non compressible). Left: ABI indicates moderate reduction in arterial flow. Bilateral: Great toe PPG waveforms indicate adequate perfusion. Great toe pressures not obtained due to patient's movements secondary to pain.  . Duplex doppler  05/10/11    LE arterial dopplers demonstrate bilaterally reduced ABIs of 0.91 on right & 0.56 on left. She does report some decreased pain on the left, & there's moderate mixed-density plaque in the right SFA w/50 to 69% reduction. There's a 69% reduction in the left SFA & does appear to be occlusive disease of left posterior tibial artery. Right posterior dorsalis pedis artery demonstrates occlusive disease  . Insertion of dialysis  catheter N/A 01/06/2013    Procedure: INSERTION OF DIALYSIS CATHETER;  Surgeon: Larina Earthly, MD;  Location: [facility] OR;  Service: Vascular;  Laterality: N/A;  . Av fistula placement Right 12/24/2013    Procedure: INSERTION OF ARTERIOVENOUS (AV) GORE-TEX GRAFT ARM;  Surgeon: Chuck Hint, MD;  Location: [facility] OR;  Service: Vascular;  Laterality: Right;  . Lower extremity angiogram N/A 10/31/2011    Procedure: LOWER EXTREMITY ANGIOGRAM;  Surgeon: Chuck Hint, MD;  Location: [facility] CATH LAB;  Service: Cardiovascular;  Laterality: N/A;  . Shuntogram N/A 03/17/2014    Procedure: Betsey Amen;  Surgeon: Fransisco Hertz, MD;  Location: [facility] CATH LAB;  Service: Cardiovascular;  Laterality: N/A;  . Ligation arteriovenous gortex graft Right 03/25/2014    Procedure: LIGATION ARTERIOVENOUS GORTEX GRAFT;  Surgeon: Chuck Hint, MD;  Location: [facility] OR;  Service: Vascular;  Laterality: Right;   Family History  Problem Relation Age of Onset  . Cancer Sister     STOMACH  . Diabetes Sister   . Cancer Brother     BONE  . Diabetes Brother   . Anesthesia problems Neg Hx   . Hypotension Neg Hx   . Malignant hyperthermia Neg Hx   . Pseudochol deficiency Neg Hx   . Hyperlipidemia Daughter   . Hypertension Daughter   . Heart disease Daughter     before age 77  . Kidney disease Daughter   . Other Daughter     varicose veins  . Diabetes Daughter   . Heart disease Son     before age 79  . Hyperlipidemia Son   . Hypertension Son   . Heart attack Son   . Heart attack Daughter   . Heart disease Daughter     Before age 64   History  Substance Use Topics  . Smoking status: Former Smoker -- 0.50 packs/day for 40 years    Quit date: 07/05/1986  . Smokeless tobacco: Former Neurosurgeon     Comment: quit smoking 1988  . Alcohol Use: No   OB History    No data available     Review of Systems  Constitutional: Negative for fever and chills.  HENT: Positive for sore throat, trouble swallowing and voice  change.   Respiratory: Negative for shortness of breath.   Musculoskeletal: Positive for neck pain.  All other systems reviewed and are negative.     Allergies  Aspirin  Home Medications   Prior to Admission medications   Medication Sig Start Date End Date Taking? Authorizing Provider  acetaminophen (TYLENOL) 325 MG tablet Take 650 mg by mouth every 6 (six) hours as needed for mild pain.    Historical Provider, MD  allopurinol (ZYLOPRIM) 100 MG tablet Take 200 mg by mouth daily.     Historical Provider, MD  amLODipine (NORVASC)  5 MG tablet Take 5 mg by mouth daily. 03/11/14   Historical Provider, MD  aspirin EC 81 MG tablet Take 81 mg by mouth daily.    Historical Provider, MD  calcitRIOL (ROCALTROL) 0.25 MCG capsule Take 0.25 mcg by mouth every other day.  11/04/12   Historical Provider, MD  carvedilol (COREG) 6.25 MG tablet Take 6.25 mg by mouth 2 (two) times daily with a meal.    Historical Provider, MD  cephALEXin (KEFLEX) 500 MG capsule Take 500 mg by mouth daily.     Historical Provider, MD  clopidogrel (PLAVIX) 75 MG tablet Take 75 mg by mouth daily.    Historical Provider, MD  docusate sodium (COLACE) 100 MG capsule Take 100 mg by mouth daily as needed for mild constipation.    Historical Provider, MD  feeding supplement, GLUCERNA SHAKE, (GLUCERNA SHAKE) LIQD Take 237 mLs by mouth daily as needed (PLease offer to pt if meal intake is less than 50%). 08/23/13   [name], MD  furosemide (LASIX) 80 MG tablet Take 80-160 mg by mouth 2 (two) times daily. Take 2 tablets in the morning and 1 tablet in the afternoon    Historical Provider, MD  furosemide (LASIX) 80 MG tablet TAKE 1 TABLET (80 MG TOTAL) BY MOUTH 2 (TWO) TIMES DAILY. 03/17/14   Chrystie Nose, MD  gabapentin (NEURONTIN) 300 MG capsule Take 300 mg by mouth 3 (three) times daily.    Historical Provider, MD  HYDROcodone-acetaminophen (NORCO/VICODIN) 5-325 MG per tablet Take 1 tablet by mouth every 12 (twelve) hours as  needed for severe pain. Patient not taking: Reported on 03/02/2014 08/23/13   [name], MD  insulin glargine (LANTUS) 100 UNIT/ML injection Inject 0.22 mLs (22 Units total) into the skin daily. 08/23/13   [name], MD  ipratropium-albuterol (DUONEB) 0.5-2.5 (3) MG/3ML SOLN Take 3 mLs by nebulization every 6 (six) hours as needed (for shortness of breath).    Historical Provider, MD  isosorbide mononitrate (IMDUR) 60 MG 24 hr tablet Take 60 mg by mouth daily.    Historical Provider, MD  levothyroxine (SYNTHROID, LEVOTHROID) 75 MCG tablet Take 75 mcg by mouth daily before breakfast.    Historical Provider, MD  multivitamin (RENA-VIT) TABS tablet Take 1 tablet by mouth at bedtime. 01/15/13   Osvaldo Shipper, MD  nitroGLYCERIN (NITROSTAT) 0.4 MG SL tablet Place 0.4 mg under the tongue every 5 (five) minutes as needed for chest pain.     Historical Provider, MD  ONGLYZA 5 MG TABS tablet Take 5 mg by mouth daily. Takes every other day 11/22/12   Historical Provider, MD  oxyCODONE-acetaminophen (ROXICET) 5-325 MG per tablet Take 1 tablet by mouth every 8 (eight) hours as needed for severe pain. 01/22/14   Chuck Hint, MD  oxyCODONE-acetaminophen (ROXICET) 5-325 MG per tablet Take 1-2 tablets by mouth every 4 (four) hours as needed for severe pain. 03/25/14   Chuck Hint, MD  pravastatin (PRAVACHOL) 40 MG tablet Take 40 mg by mouth daily.    Historical Provider, MD  RENVELA 800 MG tablet Take 800 mg by mouth 3 (three) times daily with meals.  07/04/13   Historical Provider, MD  travoprost, benzalkonium, (TRAVATAN) 0.004 % ophthalmic solution Place 1 drop into both eyes at bedtime.     Historical Provider, MD   BP 162/88 mmHg  Pulse 99  Temp(Src) 98.7 F (37.1 C) (Oral)  Resp 16  SpO2 97% Physical Exam  Constitutional: She is oriented to person, place, and time.  She appears well-developed and well-nourished. No distress.  No respiratory distress. Speaks in full sentences though is  slurred by drool  HENT:  Head: Normocephalic and atraumatic.  Right Ear: External ear normal.  Left Ear: External ear normal.  Nose: Nose normal.  Mouth/Throat: There is trismus (mild) in the jaw.  Patient occasionally drools. Unable to see tonsils or oropharynx at this time. Minimal trismus but patient unable to tolerate exam with tongue blade. Mild intermittent drooling.  Eyes: Right eye exhibits no discharge. Left eye exhibits no discharge.  Cardiovascular: Normal rate, regular rhythm and normal heart sounds.   Pulmonary/Chest: Effort normal and breath sounds normal. No respiratory distress.  Abdominal: Soft. There is no tenderness.  Neurological: She is alert and oriented to person, place, and time.  Skin: Skin is warm and dry. She is not diaphoretic.  Vitals reviewed.   ED Course  Procedures (including critical care time) Labs Review Labs Reviewed  BASIC METABOLIC PANEL - Abnormal; Notable for the following:    CO2 33 (*)    Glucose, Bld 126 (*)    Creatinine, Ser 2.14 (*)    GFR calc non Af Amer 19 (*)    GFR calc Af Amer 22 (*)    All other components within normal limits  CBC WITH DIFFERENTIAL/PLATELET - Abnormal; Notable for the following:    WBC 12.3 (*)    Hemoglobin 11.3 (*)    RDW 16.8 (*)    Neutro Abs 8.0 (*)    Eosinophils Relative 15 (*)    Eosinophils Absolute 1.9 (*)    All other components within normal limits  GLUCOSE, CAPILLARY - Abnormal; Notable for the following:    Glucose-Capillary 139 (*)    All other components within normal limits  RAPID STREP SCREEN  CULTURE, BLOOD (ROUTINE X 2)  CULTURE, BLOOD (ROUTINE X 2)  CULTURE, GROUP A STREP  MRSA PCR SCREENING  STREP PNEUMONIAE URINARY ANTIGEN  LEGIONELLA ANTIGEN, URINE    Imaging Review Ct Soft Tissue Neck Wo Contrast  04/13/2014   CLINICAL DATA:  Drooling with difficulty swallowing. Renal insufficiency precludes IV contrast administration. Initial encounter.  EXAM: CT NECK WITHOUT CONTRAST   TECHNIQUE: Multidetector CT imaging of the neck was performed following the standard protocol without intravenous contrast.  COMPARISON:  Limited correlation is made with a head CT 06/15/2013.  FINDINGS: The oropharyngeal airway is patent. The nasopharyngeal airway is closed without gross thickening of the uvula. There is no evidence of thickening of the epiglottis or tongue base. The larynx appears normal. No focal inflammatory changes or mucosal lesions are identified on non contrast imaging. There is possible mild symmetric prominence of the palatine and lingual tonsils. The proximal esophagus appears grossly unremarkable.  The salivary glands appear unremarkable. The thyroid gland is atrophied. Mild carotid atherosclerosis and tortuosity are present bilaterally. There is also atherosclerosis of the aorta and great vessels.  There is no evidence of cervical lymphadenopathy or neck mass. Intracranial atrophy is grossly stable. The visualized paranasal sinuses are clear.  Mild linear scarring is present in the right lung. There is no confluent airspace opacity or suspicious pulmonary nodule. Extensive degenerative changes are present throughout the cervical spine associated with a gradual kyphosis. There is probable ankylosis across the C3-4 disc space. TMJ degenerative changes are present, worse on the left.  IMPRESSION: 1. No specific explanation for the patient's symptoms demonstrated on non contrast imaging. The nasopharyngeal airway is close without obvious abnormality of the uvula. 2. Possible mild thickening of  the palatine and lingual tonsils without focal mucosal lesion. ENT evaluation may be warranted. 3. No acute inflammatory changes or enlarged lymph nodes seen. 4. Diffuse cervical spondylosis.   Electronically Signed   By: Roxy Horseman M.D.   On: 04/13/2014 12:34     EKG Interpretation   Date/Time:  Sunday April 13 2014 11:08:21 EST Ventricular Rate:  102 PR Interval:  189 QRS Duration:  78 QT Interval:  397 QTC Calculation: 517 R Axis:   11 Text Interpretation:  Sinus tachycardia Probable left atrial enlargement  Low voltage, extremity and precordial leads Probable anteroseptal infarct,  old Prolonged QT interval No significant change since last tracing  Confirmed by Javiana Anwar  MD, Sadeen Wiegel ([id number]) on 04/13/2014 11:23:06 AM      CRITICAL CARE Performed by: Pricilla Loveless T   Total critical care time: 30 minutes  Critical care time was exclusive of separately billable procedures and treating other patients.  Critical care was necessary to treat or prevent imminent or life-threatening deterioration.  Critical care was time spent personally by me on the following activities: development of treatment plan with patient and/or surrogate as well as nursing, discussions with consultants, evaluation of patient's response to treatment, examination of patient, obtaining history from patient or surrogate, ordering and performing treatments and interventions, ordering and review of laboratory studies, ordering and review of radiographic studies, pulse oximetry and re-evaluation of patient's condition.  MDM   Final diagnoses:  Neck pain  Epiglottitis/supraglottitis  Patient's airway has remained stable, no respiratory distress. Has no fever, hypoxia. CT unremarkable but Dr. Lazarus Salines sees some swelling c/w early epiglottitis/supraglottitis. Given this, will be given zosyn, and admit to ICU (critical care to admit). Stable at this time but needs close airway monitoring.     Audree Camel, MD 04/13/14 [phone]

## 2014-04-13 NOTE — ED Notes (Signed)
Pt's right arm swollen from fingers to shoulder-- has dialysis graft in upper arm, not being used at present.

## 2014-04-13 NOTE — Progress Notes (Signed)
ANTIBIOTIC CONSULT NOTE - INITIAL  Pharmacy Consult for Zosyn Indication: epiglottitis  Allergies  Allergen Reactions  . Aspirin Nausea And Vomiting    325 mg (adult strength) [name]     Patient Measurements:    Vital Signs: Temp: 98.7 F (37.1 C) (01/24 1109) Temp Source: Oral (01/24 1109) BP: 167/63 mmHg (01/24 1413) Pulse Rate: 96 (01/24 1413) Intake/Output from previous day:   Intake/Output from this shift:    Labs:  Recent Labs  04/13/14 1222  WBC 12.3*  HGB 11.3*  PLT 334  CREATININE 2.14*   CrCl cannot be calculated (Unknown ideal weight.). No results for input(s): VANCOTROUGH, VANCOPEAK, VANCORANDOM, GENTTROUGH, GENTPEAK, GENTRANDOM, TOBRATROUGH, TOBRAPEAK, TOBRARND, AMIKACINPEAK, AMIKACINTROU, AMIKACIN in the last 72 hours.   Microbiology: No results found for this or any previous visit (from the past 720 hour(s)).  Medical History: Past Medical History  Diagnosis Date  . Coronary artery disease     a. Cath 09/2010 - med rx.  Marland Kitchen TIA (transient ischemic attack)   . Hypertension   . Shingles   . Stroke   . Anemia   . Cardiomyopathy, idiopathic 02/08/2011  . Myocardial infarct     x 3 unsure of years  . Irregular heartbeat   . Ulcer   . GERD (gastroesophageal reflux disease)   . Hx of transient ischemic attack (TIA)   . Memory loss   . Chronic diastolic CHF (congestive heart failure)     Takes Lasix  . Gout     takes allopurinol  . Peripheral vascular disease     a. s/p L BKA.  . Diabetes mellitus     type 2 NIDDM  . Chronic kidney disease (CKD), stage IV (severe)   . Arthritis   . Hypothyroidism     (SEVERE) Takes Levothryroxine  . Nonischemic cardiomyopathy     EF now is 55%, reduced due to myxedema, which is improved.  . Dyslipidemia   . Peripheral neuropathy   . H/O echocardiogram 09/06/11    Indication- nonIschemic Cardiomyopathy. EF = now greater than 55% with no  regional wall motion abnormalities. Tthere is mild to moderate trisuspid regurgitayion and mild pulmonary hypertension with an RVSP of 35 mmHg as well as stage 1 diastolic dysfunction and mild to moderate LVH.  Marland Kitchen Abnormal nuclear stress test 06/01/09    Demonstrated a new area of infarct scar, peri-infarct ischemia seen in the inferolateral territory. EF eas normal at 70% with mild hypocontractility at the apex, distal inferolateral wall.    Medications:  See electronic med rec  Assessment: 81 y.o. female presents with sore throat, painful swallowing. CT neg for swelling. Flexible laryngoscopy shows some swelling of aryepiglottic folds and post cricoid mucosa. To begin Zosyn for epiglottitis. SCr 2.14, est CrCl 20 ml/min. WBC slightly elevated.  Goal of Therapy:  Resolution of infection  Plan:  1. Zosyn 3.375gm IV q8h - first dose over 30 min then subsequent doses over 4 hours. 2. Will f/u micro data, pt's clinical condition, and renal function  Christoper Fabian, PharmD, BCPS Clinical pharmacist, pager [phone] 04/13/2014,2:29 PM

## 2014-04-13 NOTE — ED Notes (Signed)
Report called  

## 2014-04-13 NOTE — ED Notes (Signed)
Myself and Claudia, EMT undressed pt; placed in gown, on monitor, continuous pulse oximetry and blood pressure cuff; family at bedside

## 2014-04-13 NOTE — H&P (Signed)
PULMONARY / CRITICAL CARE MEDICINE   Name: [name] MRN: [id number] DOB: 06-25-1923    ADMISSION DATE:  04/13/2014 CONSULTATION DATE:  04/13/14  REFERRING MD :  ENT  CHIEF COMPLAINT:  Difficulty swallowing/sore throat/drooling  INITIAL PRESENTATION: [name] is a 81yo woman w/ multiple comorbidities who presented with 2 day hx of sore throat, dysphagia, and odynophagia with drooling and difficulty breathing this morning. ENT evaluated in the ED and PCCM asked to admit for presumed early epiglottitis.   STUDIES:  CT Neck 1/24>> Mild thickening of palatine and lingual tonsils without focal mucosal lesion Laryngoscopy (ENT) 1/24>> Normal epiglottis. Some swelling of aryepiglottic folds and post cricoid mucosa. Pooling of secretions in pyriforms. Vocal cords mobile.   SIGNIFICANT EVENTS: 1/24>> Presents with 2 day hx of sore throat and difficulty swallowing with drooling today. Evaluated by ENT in ED, likely early epiglottitis/supraglottitis. Admitted to ICU.    HISTORY OF PRESENT ILLNESS:  [name] is a 81yo woman w/ PMHx of CVA, nonischemic cardiomyopathy, Type 2 DM, PVD s/p L BKA, CKD Stage IV, and diastolic CHF who presents to the ED with 2 day history of sore throat and left-sided neck pain. Family states she "feels as if a pill is stuck in her throat." She states she started to have some drooling and difficulty breathing this morning around 9 AM which prompted her to come to the ED. Patient states she no longer feels SOB or that she is having difficulty breathing. She notes dysphagia and odynophagia. She states she has pain when she takes a deep breath and with coughing. She denies fever, chills, productive cough. She denies any sick contacts. She was evaluated by ENT in the ED and PCCM was consulted for presumed early epiglottitis.   PAST MEDICAL HISTORY :   has a past medical history of Coronary artery disease; TIA (transient ischemic attack); Hypertension; Shingles; Stroke;  Anemia; Cardiomyopathy, idiopathic (02/08/2011); Myocardial infarct; Irregular heartbeat; Ulcer; GERD (gastroesophageal reflux disease); transient ischemic attack (TIA); Memory loss; Chronic diastolic CHF (congestive heart failure); Gout; Peripheral vascular disease; Diabetes mellitus; Chronic kidney disease (CKD), stage IV (severe); Arthritis; Hypothyroidism; Nonischemic cardiomyopathy; Dyslipidemia; Peripheral neuropathy; H/O echocardiogram (09/06/11); and Abnormal nuclear stress test (06/01/09).  has past surgical history that includes Back surgery; Eye surgery; left bka (07/05/2011); AV fistula placement; Amputation (07/05/2011); Angioplasty (1988); Cardiac catheterization (09/28/07); left lower extremity venous duplex (Left, 06/27/11); Lower extremity arterial evaluation (06/27/11); Duplex doppler (05/10/11); Insertion of dialysis catheter (N/A, 01/06/2013); AV fistula placement (Right, 12/24/2013); lower extremity angiogram (N/A, 10/31/2011); shuntogram (N/A, 03/17/2014); and Ligation arteriovenous gortex graft (Right, 03/25/2014). Prior to Admission medications   Medication Sig Start Date End Date Taking? Authorizing Provider  acetaminophen (TYLENOL) 325 MG tablet Take 650 mg by mouth every 6 (six) hours as needed for mild pain.    Historical Provider, MD  allopurinol (ZYLOPRIM) 100 MG tablet Take 200 mg by mouth daily.     Historical Provider, MD  amLODipine (NORVASC) 5 MG tablet Take 5 mg by mouth daily. 03/11/14   Historical Provider, MD  aspirin EC 81 MG tablet Take 81 mg by mouth daily.    Historical Provider, MD  calcitRIOL (ROCALTROL) 0.25 MCG capsule Take 0.25 mcg by mouth every other day.  11/04/12   Historical Provider, MD  carvedilol (COREG) 6.25 MG tablet Take 6.25 mg by mouth 2 (two) times daily with a meal.    Historical Provider, MD  cephALEXin (KEFLEX) 500 MG capsule Take 500 mg by mouth daily.  Historical Provider, MD  clopidogrel (PLAVIX) 75 MG tablet Take 75 mg by mouth daily.    Historical  Provider, MD  docusate sodium (COLACE) 100 MG capsule Take 100 mg by mouth daily as needed for mild constipation.    Historical Provider, MD  feeding supplement, GLUCERNA SHAKE, (GLUCERNA SHAKE) LIQD Take 237 mLs by mouth daily as needed (PLease offer to pt if meal intake is less than 50%). 08/23/13   [name], MD  furosemide (LASIX) 80 MG tablet Take 80-160 mg by mouth 2 (two) times daily. Take 2 tablets in the morning and 1 tablet in the afternoon    Historical Provider, MD  furosemide (LASIX) 80 MG tablet TAKE 1 TABLET (80 MG TOTAL) BY MOUTH 2 (TWO) TIMES DAILY. 03/17/14   Chrystie Nose, MD  gabapentin (NEURONTIN) 300 MG capsule Take 300 mg by mouth 3 (three) times daily.    Historical Provider, MD  HYDROcodone-acetaminophen (NORCO/VICODIN) 5-325 MG per tablet Take 1 tablet by mouth every 12 (twelve) hours as needed for severe pain. Patient not taking: Reported on 03/02/2014 08/23/13   [name], MD  insulin glargine (LANTUS) 100 UNIT/ML injection Inject 0.22 mLs (22 Units total) into the skin daily. 08/23/13   [name], MD  ipratropium-albuterol (DUONEB) 0.5-2.5 (3) MG/3ML SOLN Take 3 mLs by nebulization every 6 (six) hours as needed (for shortness of breath).    Historical Provider, MD  isosorbide mononitrate (IMDUR) 60 MG 24 hr tablet Take 60 mg by mouth daily.    Historical Provider, MD  levothyroxine (SYNTHROID, LEVOTHROID) 75 MCG tablet Take 75 mcg by mouth daily before breakfast.    Historical Provider, MD  multivitamin (RENA-VIT) TABS tablet Take 1 tablet by mouth at bedtime. 01/15/13   Osvaldo Shipper, MD  nitroGLYCERIN (NITROSTAT) 0.4 MG SL tablet Place 0.4 mg under the tongue every 5 (five) minutes as needed for chest pain.     Historical Provider, MD  ONGLYZA 5 MG TABS tablet Take 5 mg by mouth daily. Takes every other day 11/22/12   Historical Provider, MD  oxyCODONE-acetaminophen (ROXICET) 5-325 MG per tablet Take 1 tablet by mouth every 8 (eight) hours as needed for severe  pain. 01/22/14   Chuck Hint, MD  oxyCODONE-acetaminophen (ROXICET) 5-325 MG per tablet Take 1-2 tablets by mouth every 4 (four) hours as needed for severe pain. 03/25/14   Chuck Hint, MD  pravastatin (PRAVACHOL) 40 MG tablet Take 40 mg by mouth daily.    Historical Provider, MD  RENVELA 800 MG tablet Take 800 mg by mouth 3 (three) times daily with meals.  07/04/13   Historical Provider, MD  travoprost, benzalkonium, (TRAVATAN) 0.004 % ophthalmic solution Place 1 drop into both eyes at bedtime.     Historical Provider, MD   Allergies  Allergen Reactions  . Aspirin Nausea And Vomiting    325 mg (adult strength) [name]     FAMILY HISTORY:  indicated that her mother is deceased. She indicated that her father is deceased. She indicated that her maternal grandmother is deceased. She indicated that her daughter is deceased.  SOCIAL HISTORY:  reports that she quit smoking about 27 years ago. She has quit using smokeless tobacco. She reports that she does not drink alcohol or use illicit drugs.  REVIEW OF SYSTEMS:   General: Denies night sweats, changes in weight, changes in appetite HEENT: Denies headaches, ear pain, changes in vision, rhinorrhea CV: Denies CP, palpitations,  orthopnea Pulm: Denies wheezing GI: Denies abdominal pain, nausea, vomiting, diarrhea, constipation, melena, hematochezia GU: Denies dysuria, hematuria, frequency Msk: Denies muscle cramps, joint pains Neuro: Denies weakness, numbness, tingling Skin: Denies rashes, bruising  SUBJECTIVE: Patient reports difficulty breathing/SOB has improved. She complains of worsening left-sided neck pain. She is able to talk, but is having lots of secretions.   VITAL SIGNS: Temp:  [98.7 F (37.1 C)] 98.7 F (37.1 C) (01/24 1109) Pulse Rate:  [96-106] 96 (01/24 1413) Resp:  [14-21] 21 (01/24 1413) BP: (149-173)/(63-88) 167/63 mmHg (01/24 1413) SpO2:  [96  %-98 %] 98 % (01/24 1413) HEMODYNAMICS:     INTAKE / OUTPUT: No intake or output data in the 24 hours ending 04/13/14 1432  PHYSICAL EXAMINATION: General: alert, sitting up in bed, suctioning saliva from mouth Neuro: alert and oriented x 3, follows commands, speaks in full sentences  HEENT: [place]/AT, EOMI, tympanic membranes normal bilaterally. Difficult to assess pharynx, but does not appear erythematous or swollen. No lip or tongue swelling.  Neck: Tenderness to palpation of left side of neck. No masses or nodules palpated.  Cardiovascular: tachycardic in 100s, no m/g/r Lungs: coarse upper airway breath sounds Abdomen: BS+, soft, non-tender Musculoskeletal: warm, trace edema. L BKA appears c/d/i. AV fistula in right upper extremity.  Skin: warm and well perfused   LABS:  CBC  Recent Labs Lab 04/13/14 1222  WBC 12.3*  HGB 11.3*  HCT 36.4  PLT 334   Coag's No results for input(s): APTT, INR in the last 168 hours. BMET  Recent Labs Lab 04/13/14 1222  NA 137  K 4.6  CL 99  CO2 33*  BUN 22  CREATININE 2.14*  GLUCOSE 126*   Electrolytes  Recent Labs Lab 04/13/14 1222  CALCIUM 9.4   Sepsis Markers No results for input(s): LATICACIDVEN, PROCALCITON, O2SATVEN in the last 168 hours. ABG No results for input(s): PHART, PCO2ART, PO2ART in the last 168 hours. Liver Enzymes No results for input(s): AST, ALT, ALKPHOS, BILITOT, ALBUMIN in the last 168 hours. Cardiac Enzymes No results for input(s): TROPONINI, PROBNP in the last 168 hours. Glucose No results for input(s): GLUCAP in the last 168 hours.  Imaging No results found.   ASSESSMENT / PLAN:  PULMONARY A: Early epiglottitis/supraglottitis P:   Continue Zosyn Monitor respiratory status Check resp viral panel Check Legionella, Strep pneumo Oxygen therapy as needed Blood cultures x 2 ENT following, appreciate recommendations   CARDIOVASCULAR A: Mild tachycardia- related to acute pain Nonischemic  cardiomyopathy Peripheral vascular disease s/p L BKA Chronic diastolic CHF Prolonged QTc P:  Hold home meds for now Avoid QT-prolonging medications   RENAL A:  CKD Stage IV P:   Check bmet in AM  GASTROINTESTINAL A:  GERD P:   Keep NPO for now  HEMATOLOGIC A:  No acute issues P:  CBC intermittently  VTE PPx- heparin   INFECTIOUS A:  Early epiglottitis/supraglottitis- epiglottis normal but swelling of aryepiglottic folds P:   BCx2 1/24>> Rapid strep 1/24>> neg Resp virus panel 1/24>>  Zosyn 1/24>>  ENDOCRINE A:  Type 2 DM Hypothyroidism P:   Sensitive ISS  NEUROLOGIC A: No acute issues P:   Continue to monitor   FAMILY  - Updates: Family updated at bedside 1/24  - Inter-disciplinary family meet or Palliative Care meeting due by: 04/20/14    Rich Number, MD Internal Medicine Resident, PGY-1   Pulmonary and Critical Care Medicine [facility] Pager: [phone]  04/13/2014, 2:32 PM  Attending:  I have  seen and examined the patient with nurse practitioner/resident and agree with the note above.   On exam her lungs are clear, no stridor She noted difficulty swallowing last night, felt a pill got stuck CT neck has debris in esophagus So I worry this may be food impaction  Try ice chips, if can't swallow may need GI consult  Monitor closely in ICU  Heber Henderson, MD [place] [facility] Pager: [phone] Cell: [phone] If no response, call [phone]

## 2014-04-13 NOTE — Consult Note (Signed)
[place], Schaffert 81 y.o., female [id number]     Chief Complaint: sore throat  HPI: 81 yo bf, onset sore throat, hoarseness, painful swallowing this morning.  No fever.  No ACE inhibitors.  No prior similar events.  No trauma to the neck or throat.  Drank coffee this AM, but not too hot.  No foreign body ingestion.  Son  thought she had some external neck swelling. DM, CRF, CHF all basically stable.   WBC 12K, strep screen pending.  CT does not show significant laryngeal swelling.  No peritonsillar, parapharyngeal, retropharyngeal swelling.  No significant adenopathy.  Degenerative cervical spine disease.   PMH: Past Medical History  Diagnosis Date  . Coronary artery disease     a. Cath 09/2010 - med rx.  Marland Kitchen TIA (transient ischemic attack)   . Hypertension   . Shingles   . Stroke   . Anemia   . Cardiomyopathy, idiopathic 02/08/2011  . Myocardial infarct     x 3 unsure of years  . Irregular heartbeat   . Ulcer   . GERD (gastroesophageal reflux disease)   . Hx of transient ischemic attack (TIA)   . Memory loss   . Chronic diastolic CHF (congestive heart failure)     Takes Lasix  . Gout     takes allopurinol  . Peripheral vascular disease     a. s/p L BKA.  . Diabetes mellitus     type 2 NIDDM  . Chronic kidney disease (CKD), stage IV (severe)   . Arthritis   . Hypothyroidism     (SEVERE) Takes Levothryroxine  . Nonischemic cardiomyopathy     EF now is 55%, reduced due to myxedema, which is improved.  . Dyslipidemia   . Peripheral neuropathy   . H/O echocardiogram 09/06/11    Indication- nonIschemic Cardiomyopathy. EF = now greater than 55% with no regional wall motion abnormalities. Tthere is mild to moderate trisuspid regurgitayion and mild pulmonary hypertension with an RVSP of 35 mmHg as well as stage 1 diastolic dysfunction and mild to moderate LVH.  Marland Kitchen Abnormal nuclear stress test 06/01/09    Demonstrated a new area of infarct scar, peri-infarct ischemia seen in the  inferolateral territory. EF eas normal at 70% with mild hypocontractility at the apex, distal inferolateral wall.    Surg Hx: Past Surgical History  Procedure Laterality Date  . Back surgery      [place] surgery      Left eye surgery; cataract removal  . Left bka  07/05/2011  . Av fistula placement    . Amputation  07/05/2011    Procedure: AMPUTATION BELOW KNEE;  Surgeon: Angelia Mould, MD;  Location: [facility];  Service: Vascular;  Laterality: Left;  . Angioplasty  1988  . Cardiac catheterization  09/28/07    Demonstrated multiple sequential lesions around 40 to 30% in the RCA territory.  . Left lower extremity venous duplex Left 06/27/11    Summary: No evidence of DVT involving the left lower extremity and right common femoral vein.   . Lower extremity arterial evaluation  06/27/11    SUMMARY: Right: ABI not ascertained due to false elevation in BP secondary to calcification (posterior tibial artery is non compressible). Left: ABI indicates moderate reduction in arterial flow. Bilateral: Great toe PPG waveforms indicate adequate perfusion. Great toe pressures not obtained due to patient's movements secondary to pain.  . Duplex doppler  05/10/11    LE arterial dopplers demonstrate bilaterally reduced  ABIs of 0.91 on right & 0.56 on left. She does report some decreased pain on the left, & there's moderate mixed-density plaque in the right SFA w/50 to 69% reduction. There's a 69% reduction in the left SFA & does appear to be occlusive disease of left posterior tibial artery. Right posterior dorsalis pedis artery demonstrates occlusive disease  . Insertion of dialysis catheter N/A 01/06/2013    Procedure: INSERTION OF DIALYSIS CATHETER;  Surgeon: Rosetta Posner, MD;  Location: [facility];  Service: Vascular;  Laterality: N/A;  . Av fistula placement Right 12/24/2013    Procedure: INSERTION OF ARTERIOVENOUS (AV) GORE-TEX GRAFT ARM;  Surgeon: Angelia Mould, MD;  Location: [facility];   Service: Vascular;  Laterality: Right;  . Lower extremity angiogram N/A 10/31/2011    Procedure: LOWER EXTREMITY ANGIOGRAM;  Surgeon: Angelia Mould, MD;  Location: [facility] CATH LAB;  Service: Cardiovascular;  Laterality: N/A;  . Shuntogram N/A 03/17/2014    Procedure: Earney Mallet;  Surgeon: Conrad Richland, MD;  Location: [facility] CATH LAB;  Service: Cardiovascular;  Laterality: N/A;  . Ligation arteriovenous gortex graft Right 03/25/2014    Procedure: LIGATION ARTERIOVENOUS GORTEX GRAFT;  Surgeon: Angelia Mould, MD;  Location: [facility] OR;  Service: Vascular;  Laterality: Right;    FHx:   Family History  Problem Relation Age of Onset  . Cancer Sister     STOMACH  . Diabetes Sister   . Cancer Brother     BONE  . Diabetes Brother   . Anesthesia problems Neg Hx   . Hypotension Neg Hx   . Malignant hyperthermia Neg Hx   . Pseudochol deficiency Neg Hx   . Hyperlipidemia Daughter   . Hypertension Daughter   . Heart disease Daughter     before age 82  . Kidney disease Daughter   . Other Daughter     varicose veins  . Diabetes Daughter   . Heart disease Son     before age 25  . Hyperlipidemia Son   . Hypertension Son   . Heart attack Son   . Heart attack Daughter   . Heart disease Daughter     Before age 45   SocHx:  reports that she quit smoking about 27 years ago. She has quit using smokeless tobacco. She reports that she does not drink alcohol or use illicit drugs.  ALLERGIES:  Allergies  Allergen Reactions  . Aspirin Nausea And Vomiting    325 mg (adult strength) [name]      (Not in a hospital admission)  Results for orders placed or performed during the hospital encounter of 04/13/14 (from the past 48 hour(s))  Basic metabolic panel     Status: Abnormal   Collection Time: 04/13/14 12:22 PM  Result Value Ref Range   Sodium 137 135 - 145 mmol/L   Potassium 4.6 3.5 - 5.1 mmol/L   Chloride 99 96 - 112 mmol/L    CO2 33 (H) 19 - 32 mmol/L   Glucose, Bld 126 (H) 70 - 99 mg/dL   BUN 22 6 - 23 mg/dL   Creatinine, Ser 2.14 (H) 0.50 - 1.10 mg/dL   Calcium 9.4 8.4 - 10.5 mg/dL   GFR calc non Af Amer 19 (L) >90 mL/min   GFR calc Af Amer 22 (L) >90 mL/min    Comment: (NOTE) The eGFR has been calculated using the CKD EPI equation. This calculation has not been validated in  all clinical situations. eGFR's persistently <90 mL/min signify possible Chronic Kidney Disease.    Anion gap 5 5 - 15  CBC with Differential     Status: Abnormal   Collection Time: 04/13/14 12:22 PM  Result Value Ref Range   WBC 12.3 (H) 4.0 - 10.5 K/uL   RBC 4.14 3.87 - 5.11 MIL/uL   Hemoglobin 11.3 (L) 12.0 - 15.0 g/dL   HCT 36.4 36.0 - 46.0 %   MCV 87.9 78.0 - 100.0 fL   MCH 27.3 26.0 - 34.0 pg   MCHC 31.0 30.0 - 36.0 g/dL   RDW 16.8 (H) 11.5 - 15.5 %   Platelets 334 150 - 400 K/uL   Neutrophils Relative % 65 43 - 77 %   Neutro Abs 8.0 (H) 1.7 - 7.7 K/uL   Lymphocytes Relative 15 12 - 46 %   Lymphs Abs 1.8 0.7 - 4.0 K/uL   Monocytes Relative 5 3 - 12 %   Monocytes Absolute 0.6 0.1 - 1.0 K/uL   Eosinophils Relative 15 (H) 0 - 5 %   Eosinophils Absolute 1.9 (H) 0.0 - 0.7 K/uL   Basophils Relative 0 0 - 1 %   Basophils Absolute 0.0 0.0 - 0.1 K/uL   Ct Soft Tissue Neck Wo Contrast  04/13/2014   CLINICAL DATA:  Drooling with difficulty swallowing. Renal insufficiency precludes IV contrast administration. Initial encounter.  EXAM: CT NECK WITHOUT CONTRAST  TECHNIQUE: Multidetector CT imaging of the neck was performed following the standard protocol without intravenous contrast.  COMPARISON:  Limited correlation is made with a head CT 06/15/2013.  FINDINGS: The oropharyngeal airway is patent. The nasopharyngeal airway is closed without gross thickening of the uvula. There is no evidence of thickening of the epiglottis or tongue base. The larynx appears normal. No focal inflammatory changes or mucosal lesions are identified on non  contrast imaging. There is possible mild symmetric prominence of the palatine and lingual tonsils. The proximal esophagus appears grossly unremarkable.  The salivary glands appear unremarkable. The thyroid gland is atrophied. Mild carotid atherosclerosis and tortuosity are present bilaterally. There is also atherosclerosis of the aorta and great vessels.  There is no evidence of cervical lymphadenopathy or neck mass. Intracranial atrophy is grossly stable. The visualized paranasal sinuses are clear.  Mild linear scarring is present in the right lung. There is no confluent airspace opacity or suspicious pulmonary nodule. Extensive degenerative changes are present throughout the cervical spine associated with a gradual kyphosis. There is probable ankylosis across the C3-4 disc space. TMJ degenerative changes are present, worse on the left.  IMPRESSION: 1. No specific explanation for the patient's symptoms demonstrated on non contrast imaging. The nasopharyngeal airway is close without obvious abnormality of the uvula. 2. Possible mild thickening of the palatine and lingual tonsils without focal mucosal lesion. ENT evaluation may be warranted. 3. No acute inflammatory changes or enlarged lymph nodes seen. 4. Diffuse cervical spondylosis.   Electronically Signed   By: Camie Patience M.D.   On: 04/13/2014 12:34    ROS:  No chest pain, SOB.  No abd pain.  Blood pressure 173/70, pulse 99, temperature 98.7 F (37.1 C), temperature source Oral, resp. rate 14, SpO2 96 %.  PHYSICAL EXAM: Overall appearance:  Short, overweight.  Suctioning saliva from mouth and reluctant to swallow.  Mental status OK.  Not warm to touch. Head:  NCAT Ears:  clear Nose: sl dry and excoriated c/w winter Oral Cavity:  Moist.  Large mandibular tori.  No  tongue or lip swelling.   Oral Pharynx/Hypopharynx/Larynx:  Moist, no visible swelling.  Neuro:  Grossly intact Neck:  Sl tender, no adenopathy.  No crepitus.    With informed  consent, using 5 ml 2% viscous xylocaine per nares, flexible laryngoscopy was performed.  Epiglottis looks nl.  Some swelling of the aryepiglottic folds and post cricoid mucosa.  Some pooling of secretions in pyriforms.  Vocal cords mobile with good airway.    Studies Reviewed:  CT neck    Assessment/Plan Early adult epiglottitis/supraglottitis  Will admit to ICU, on CCM service.  Zosyn on advice of Dr. Johnnye Sima.  Await strep screen and will check blood cultures now, and if any fever.    Jodi Marble 09/03/4882, 2:05 PM

## 2014-04-13 NOTE — ED Notes (Signed)
Pt remains monitored by blood pressure, pulse ox, and 12 lead. Karen Cobb at bedside.

## 2014-04-13 NOTE — Progress Notes (Signed)
04/13/2014 3:08 PM [name], [name] ,RN, BSN UR completed

## 2014-04-13 NOTE — Progress Notes (Signed)
  PAtient having throat pain Unable to swallow due to pain  Plan IV tylenol x 1  Dr. Kalman Shan, M.D., [facility].C.P Pulmonary and Critical Care Medicine Staff Physician [facility] Pulmonary and Critical Care Pager: [phone], If no answer or between  15:00h - 7:00h: call [phone]  04/13/2014 6:02 PM

## 2014-04-13 NOTE — ED Notes (Signed)
Pt remains monitored by blood pressure, pulse ox, and 12 lead. pts family remains at bedside.  

## 2014-04-13 NOTE — ED Notes (Signed)
MD at bedside to attempt ultrasound IV

## 2014-04-13 NOTE — Progress Notes (Signed)
RN reports new finding of right breast mass on her exam Patient awawe of this and apparently has seen MD about this and has bx scheduled in Feb 2016.  Patient says Rt neck and arm swollen on her exam  Plan monitopr  Dr. Kalman Shan, M.D., [facility].C.P Pulmonary and Critical Care Medicine Staff Physician [facility] [place] Pulmonary and Critical Care Pager: [phone], If no answer or between  15:00h - 7:00h: call [phone]  04/13/2014 4:50 PM

## 2014-04-14 DIAGNOSIS — T18128A Food in esophagus causing other injury, initial encounter: Secondary | ICD-10-CM | POA: Insufficient documentation

## 2014-04-14 DIAGNOSIS — K2970 Gastritis, unspecified, without bleeding: Secondary | ICD-10-CM

## 2014-04-14 DIAGNOSIS — W44F3XA Food entering into or through a natural orifice, initial encounter: Secondary | ICD-10-CM | POA: Insufficient documentation

## 2014-04-14 DIAGNOSIS — R1310 Dysphagia, unspecified: Secondary | ICD-10-CM

## 2014-04-14 DIAGNOSIS — M542 Cervicalgia: Secondary | ICD-10-CM | POA: Insufficient documentation

## 2014-04-14 DIAGNOSIS — T18128D Food in esophagus causing other injury, subsequent encounter: Secondary | ICD-10-CM

## 2014-04-14 LAB — CBC
HCT: 34.1 % — ABNORMAL LOW (ref 36.0–46.0)
HEMOGLOBIN: 10.6 g/dL — AB (ref 12.0–15.0)
MCH: 27.2 pg (ref 26.0–34.0)
MCHC: 31.1 g/dL (ref 30.0–36.0)
MCV: 87.7 fL (ref 78.0–100.0)
Platelets: 307 10*3/uL (ref 150–400)
RBC: 3.89 MIL/uL (ref 3.87–5.11)
RDW: 16.9 % — AB (ref 11.5–15.5)
WBC: 11.3 10*3/uL — ABNORMAL HIGH (ref 4.0–10.5)

## 2014-04-14 LAB — BASIC METABOLIC PANEL
Anion gap: 9 (ref 5–15)
BUN: 19 mg/dL (ref 6–23)
CO2: 30 mmol/L (ref 19–32)
CREATININE: 2.18 mg/dL — AB (ref 0.50–1.10)
Calcium: 8.9 mg/dL (ref 8.4–10.5)
Chloride: 104 mmol/L (ref 96–112)
GFR calc Af Amer: 22 mL/min — ABNORMAL LOW (ref >90)
GFR calc non Af Amer: 19 mL/min — ABNORMAL LOW (ref >90)
Glucose, Bld: 129 mg/dL — ABNORMAL HIGH (ref 70–99)
Potassium: 3.6 mmol/L (ref 3.5–5.1)
Sodium: 143 mmol/L (ref 135–145)

## 2014-04-14 LAB — GLUCOSE, CAPILLARY
GLUCOSE-CAPILLARY: 127 mg/dL — AB (ref 70–99)
Glucose-Capillary: 111 mg/dL — ABNORMAL HIGH (ref 70–99)
Glucose-Capillary: 101 mg/dL — ABNORMAL HIGH (ref 70–99)
Glucose-Capillary: 109 mg/dL — ABNORMAL HIGH (ref 70–99)

## 2014-04-14 LAB — MRSA PCR SCREENING: MRSA by PCR: NEGATIVE

## 2014-04-14 MED ORDER — SODIUM CHLORIDE 0.9 % IV SOLN
1.5000 g | Freq: Two times a day (BID) | INTRAVENOUS | Status: DC
  Administered 2014-04-14 – 2014-04-17 (×7): 1.5 g via INTRAVENOUS
  Filled 2014-04-14 (×8): qty 1.5

## 2014-04-14 MED ORDER — CETYLPYRIDINIUM CHLORIDE 0.05 % MT LIQD
7.0000 mL | Freq: Two times a day (BID) | OROMUCOSAL | Status: DC
  Administered 2014-04-15 – 2014-04-17 (×3): 7 mL via OROMUCOSAL

## 2014-04-14 MED ORDER — PANTOPRAZOLE SODIUM 40 MG PO TBEC: 40.0000 mg | DELAYED_RELEASE_TABLET | Freq: Every day | ORAL | Status: DC

## 2014-04-14 MED ORDER — CHLORHEXIDINE GLUCONATE 0.12 % MT SOLN
15.0000 mL | Freq: Two times a day (BID) | OROMUCOSAL | Status: DC
  Administered 2014-04-14 – 2014-04-17 (×6): 15 mL via OROMUCOSAL
  Filled 2014-04-14 (×6): qty 15

## 2014-04-14 MED ORDER — FENTANYL CITRATE 0.05 MG/ML IJ SOLN: 12.5000 ug | INTRAMUSCULAR | Status: DC | PRN

## 2014-04-14 MED ORDER — SODIUM CHLORIDE 0.9 % IV SOLN: INTRAVENOUS | Status: DC

## 2014-04-14 NOTE — Anesthesia Preprocedure Evaluation (Addendum)
Anesthesia Evaluation  Patient identified by MRN, date of birth, ID band Patient awake    Reviewed: Allergy & Precautions, NPO status , Patient's Chart, lab work & pertinent test results, reviewed documented beta blocker date and time   Airway Mallampati: II   Neck ROM: Full    Dental   Pulmonary former smoker (quit 1988 20 pack years),          Cardiovascular hypertension, Pt. on medications + CAD and + Past MI Rhythm:Regular  ECHO 2015 55%   Neuro/Psych    GI/Hepatic Neg liver ROS, GERD-  Medicated,  Endo/Other  diabetes, Poorly Controlled, Type 2  Renal/GU DialysisRenal disease     Musculoskeletal   Abdominal (+)  Abdomen: soft.    Peds  Hematology  (+) anemia , 10/34 H/H   Anesthesia Other Findings   Reproductive/Obstetrics                            Anesthesia Physical Anesthesia Plan  ASA: III  Anesthesia Plan: MAC   Post-op Pain Management:    Induction: Intravenous  Airway Management Planned: Nasal Cannula  Additional Equipment:   Intra-op Plan:   Post-operative Plan:   Informed Consent: I have reviewed the patients History and Physical, chart, labs and discussed the procedure including the risks, benefits and alternatives for the proposed anesthesia with the patient or authorized representative who has indicated his/her understanding and acceptance.     Plan Discussed with:   Anesthesia Plan Comments:         Anesthesia Quick Evaluation

## 2014-04-14 NOTE — Progress Notes (Signed)
04/14/2014 10:16 AM  [name] [id number]  Hosp  Day 2    Temp:  [97.1 F (36.2 C)-99 F (37.2 C)] 98.8 F (37.1 C) (01/25 0818) Pulse Rate:  [83-106] 94 (01/25 0800) Resp:  [12-28] 17 (01/25 0800) BP: (113-192)/(40-113) 168/52 mmHg (01/25 0800) SpO2:  [88 %-100 %] 93 % (01/25 0800) Weight:  [57.7 kg (127 lb 3.3 oz)-58.7 kg (129 lb 6.6 oz)] 58.7 kg (129 lb 6.6 oz) (01/25 0500),     Intake/Output Summary (Last 24 hours) at 04/14/14 1016 Last data filed at 04/14/14 0800  Gross per 24 hour  Intake  167.5 ml  Output    890 ml  Net -722.5 ml    Results for orders placed or performed during the hospital encounter of 04/13/14 (from the past 24 hour(s))  Basic metabolic panel     Status: Abnormal   Collection Time: 04/13/14 12:22 PM  Result Value Ref Range   Sodium 137 135 - 145 mmol/L   Potassium 4.6 3.5 - 5.1 mmol/L   Chloride 99 96 - 112 mmol/L   CO2 33 (H) 19 - 32 mmol/L   Glucose, Bld 126 (H) 70 - 99 mg/dL   BUN 22 6 - 23 mg/dL   Creatinine, Ser 4.78 (H) 0.50 - 1.10 mg/dL   Calcium 9.4 8.4 - 29.5 mg/dL   GFR calc non Af Amer 19 (L) >90 mL/min   GFR calc Af Amer 22 (L) >90 mL/min   Anion gap 5 5 - 15  CBC with Differential     Status: Abnormal   Collection Time: 04/13/14 12:22 PM  Result Value Ref Range   WBC 12.3 (H) 4.0 - 10.5 K/uL   RBC 4.14 3.87 - 5.11 MIL/uL   Hemoglobin 11.3 (L) 12.0 - 15.0 g/dL   HCT 62.1 30.8 - 65.7 %   MCV 87.9 78.0 - 100.0 fL   MCH 27.3 26.0 - 34.0 pg   MCHC 31.0 30.0 - 36.0 g/dL   RDW 84.6 (H) 96.2 - 95.2 %   Platelets 334 150 - 400 K/uL   Neutrophils Relative % 65 43 - 77 %   Neutro Abs 8.0 (H) 1.7 - 7.7 K/uL   Lymphocytes Relative 15 12 - 46 %   Lymphs Abs 1.8 0.7 - 4.0 K/uL   Monocytes Relative 5 3 - 12 %   Monocytes Absolute 0.6 0.1 - 1.0 K/uL   Eosinophils Relative 15 (H) 0 - 5 %   Eosinophils Absolute 1.9 (H) 0.0 - 0.7 K/uL   Basophils Relative 0 0 - 1 %   Basophils Absolute 0.0 0.0 - 0.1 K/uL  Rapid strep screen      Status: None   Collection Time: 04/13/14  2:12 PM  Result Value Ref Range   Streptococcus, Group A Screen (Direct) NEGATIVE NEGATIVE  Glucose, capillary     Status: Abnormal   Collection Time: 04/13/14  3:52 PM  Result Value Ref Range   Glucose-Capillary 139 (H) 70 - 99 mg/dL  MRSA PCR Screening     Status: None   Collection Time: 04/13/14  4:13 PM  Result Value Ref Range   MRSA by PCR NEGATIVE NEGATIVE  Strep pneumoniae urinary antigen     Status: None   Collection Time: 04/13/14  6:55 PM  Result Value Ref Range   Strep Pneumo Urinary Antigen NEGATIVE NEGATIVE  Glucose, capillary     Status: Abnormal   Collection Time: 04/13/14  8:01 PM  Result Value Ref Range   Glucose-Capillary  129 (H) 70 - 99 mg/dL   Comment 1 Notify RN   Glucose, capillary     Status: Abnormal   Collection Time: 04/13/14 10:19 PM  Result Value Ref Range   Glucose-Capillary 121 (H) 70 - 99 mg/dL   Comment 1 Notify RN   Basic metabolic panel     Status: Abnormal   Collection Time: 04/14/14  2:44 AM  Result Value Ref Range   Sodium 143 135 - 145 mmol/L   Potassium 3.6 3.5 - 5.1 mmol/L   Chloride 104 96 - 112 mmol/L   CO2 30 19 - 32 mmol/L   Glucose, Bld 129 (H) 70 - 99 mg/dL   BUN 19 6 - 23 mg/dL   Creatinine, Ser 9.32 (H) 0.50 - 1.10 mg/dL   Calcium 8.9 8.4 - 67.1 mg/dL   GFR calc non Af Amer 19 (L) >90 mL/min   GFR calc Af Amer 22 (L) >90 mL/min   Anion gap 9 5 - 15  CBC     Status: Abnormal   Collection Time: 04/14/14  2:44 AM  Result Value Ref Range   WBC 11.3 (H) 4.0 - 10.5 K/uL   RBC 3.89 3.87 - 5.11 MIL/uL   Hemoglobin 10.6 (L) 12.0 - 15.0 g/dL   HCT 24.5 (L) 80.9 - 98.3 %   MCV 87.7 78.0 - 100.0 fL   MCH 27.2 26.0 - 34.0 pg   MCHC 31.1 30.0 - 36.0 g/dL   RDW 38.2 (H) 50.5 - 39.7 %   Platelets 307 150 - 400 K/uL  Glucose, capillary     Status: Abnormal   Collection Time: 04/14/14  8:14 AM  Result Value Ref Range   Glucose-Capillary 111 (H) 70 - 99 mg/dL    SUBJECTIVE:  Throat less  sore.  Breathing better.  Still cannot swallow.   Reviewing history, had some to eat Sat PM.  Had pills Sun AM, then half a cup of coffee without real difficulty.  2-3 hrs later, described sore throat, hoarseness, breathing difficulty.  Does not feel like anything actually got stuck at any point.   OBJECTIVE:  Voice OK.  Breathing OK  IMPRESSION:  Question of food impaction.  Reviewing CT scan, esophagus looks full/asymmetric at cricopharyngeus level.  No distinct fb.  Along this line of inquiry, pill erosion could fit into the differential.  PLAN:  GI to consult.  May need esophagoscopy, either by them or by me.  Continue abx for now.    Flo Shanks

## 2014-04-14 NOTE — Progress Notes (Signed)
                                                                           [facility] Consult: 2:24 PM 04/14/2014  LOS: 1 day    Referring Provider: Dr McQuaid.   Primary Care Physician:  RAMACHANDRAN,AJITH, MD Primary Gastroenterologist:  Dr.Orr     Reason for Consultation:  Dysphagia   HPI: [name] is a 81 y.o. female.  PMHx of CVA, nonischemic cardiomyopathy, Type 2 DM, PVD s/p L BKA, stage 4 CKD s/p placement of dialysis graft but not on HD, diastolic CHF, TIA. Left BKA.  Chronic meds include 81 ASA, Plavix but no PPI/H2Bs.  Admitted yesterday with 2 days sore throat, dysphagia, odynophagia, SOB.  Drooling on exam.  Laryngoscopy 1/24 showed normal epiglottis, some swelling of periepiglottic folds and post cricoid mucosa. Pooling of secretions in pyriforms.  Today she  is still not able to swallow.   CT neck: Possible mild thickening of the palatine and lingual tonsils without focal mucosal lesion.  This morning her throat still feels sore and when challenged with swallowing ice chips, she is having difficulty managing even this limited volume of po's.  Prior to these events she was swallowing well, eating well and had no GI issues.  2 or 3 weeks ago the patient developed sudden onset swelling in the right arm into the neck bilaterally as well as her face and right breast. On 03/25/14, vascular surgery Dr. Dixon performed ligation of her right upper arm AV graft due to significant venous hypertension owing to central venous occlusion.  Since that time the swelling in her face, neck and breast have resolved, however she still has swelling in her right arm.  Patient was given a course of antibiotics for a mass, possibly an abscess, in the right breast.  She underwent breast ultrasound on 03/31/14 which showed marked thickening of the skin and generalized increase in the parenchymal  markings in the right breast. There was no definite mass, just generalized edema in the retroareolar right breast. Punch biopsy was recommended but no appointment yet set up for that.    In 2002 had gastritis on EGD, apparently normal colonoscopy    Past Medical History  Diagnosis Date  . Coronary artery disease     a. Cath 09/2010 - med rx.  . TIA (transient ischemic attack)   . Hypertension   . Shingles   . Stroke   . Anemia   . Cardiomyopathy, idiopathic 02/08/2011  . Myocardial infarct     x 3 unsure of years  . Irregular heartbeat   . Ulcer   . GERD (gastroesophageal reflux disease)   . Hx of transient ischemic attack (TIA)   . Memory loss   . Chronic diastolic CHF (congestive heart failure)     Takes Lasix  . Gout     takes allopurinol  . Peripheral vascular disease     a. s/p L BKA.  . Diabetes mellitus     type 2 NIDDM  . Chronic kidney disease (CKD), stage IV (severe)   . Arthritis   . Hypothyroidism     (SEVERE) Takes Levothryroxine  . Nonischemic cardiomyopathy     EF   now is 55%, reduced due to myxedema, which is improved.  . Dyslipidemia   . Peripheral neuropathy   . H/O echocardiogram 09/06/11    Indication- nonIschemic Cardiomyopathy. EF = now greater than 55% with no regional wall motion abnormalities. Tthere is mild to moderate trisuspid regurgitayion and mild pulmonary hypertension with an RVSP of 35 mmHg as well as stage 1 diastolic dysfunction and mild to moderate LVH.  . Abnormal nuclear stress test 06/01/09    Demonstrated a new area of infarct scar, peri-infarct ischemia seen in the inferolateral territory. EF eas normal at 70% with mild hypocontractility at the apex, distal inferolateral wall.    Past Surgical History  Procedure Laterality Date  . Back surgery      [facility]  . Eye surgery      Left eye surgery; cataract removal  . Left bka  07/05/2011  . Av fistula placement    . Amputation  07/05/2011    Procedure: AMPUTATION BELOW  KNEE;  Surgeon: Christopher S Dickson, MD;  Location: [facility] OR;  Service: Vascular;  Laterality: Left;  . Angioplasty  1988  . Cardiac catheterization  09/28/07    Demonstrated multiple sequential lesions around 40 to 30% in the RCA territory.  . Left lower extremity venous duplex Left 06/27/11    Summary: No evidence of DVT involving the left lower extremity and right common femoral vein.   . Lower extremity arterial evaluation  06/27/11    SUMMARY: Right: ABI not ascertained due to false elevation in BP secondary to calcification (posterior tibial artery is non compressible). Left: ABI indicates moderate reduction in arterial flow. Bilateral: Great toe PPG waveforms indicate adequate perfusion. Great toe pressures not obtained due to patient's movements secondary to pain.  . Duplex doppler  05/10/11    LE arterial dopplers demonstrate bilaterally reduced ABIs of 0.91 on right & 0.56 on left. She does report some decreased pain on the left, & there's moderate mixed-density plaque in the right SFA w/50 to 69% reduction. There's a 69% reduction in the left SFA & does appear to be occlusive disease of left posterior tibial artery. Right posterior dorsalis pedis artery demonstrates occlusive disease  . Insertion of dialysis catheter N/A 01/06/2013    Procedure: INSERTION OF DIALYSIS CATHETER;  Surgeon: Todd F Early, MD;  Location: [facility] OR;  Service: Vascular;  Laterality: N/A;  . Av fistula placement Right 12/24/2013    Procedure: INSERTION OF ARTERIOVENOUS (AV) GORE-TEX GRAFT ARM;  Surgeon: Christopher S Dickson, MD;  Location: [facility] OR;  Service: Vascular;  Laterality: Right;  . Lower extremity angiogram N/A 10/31/2011    Procedure: LOWER EXTREMITY ANGIOGRAM;  Surgeon: Christopher S Dickson, MD;  Location: [facility] CATH LAB;  Service: Cardiovascular;  Laterality: N/A;  . Shuntogram N/A 03/17/2014    Procedure: SHUNTOGRAM;  Surgeon: Brian L Chen, MD;  Location: [facility] CATH LAB;  Service: Cardiovascular;  Laterality: N/A;  .  Ligation arteriovenous gortex graft Right 03/25/2014    Procedure: LIGATION ARTERIOVENOUS GORTEX GRAFT;  Surgeon: Christopher S Dickson, MD;  Location: [facility] OR;  Service: Vascular;  Laterality: Right;    Prior to Admission medications   Medication Sig Start Date End Date Taking? Authorizing Provider  acetaminophen (TYLENOL) 325 MG tablet Take 650 mg by mouth every 6 (six) hours as needed for mild pain.   Yes Historical Provider, MD  allopurinol (ZYLOPRIM) 100 MG tablet Take 200 mg by mouth daily.    Yes Historical Provider, MD  amLODipine (NORVASC) 10 MG tablet   Take 10 mg by mouth daily.   Yes Historical Provider, MD  aspirin EC 81 MG tablet Take 81 mg by mouth daily.   Yes Historical Provider, MD  calcitRIOL (ROCALTROL) 0.25 MCG capsule Take 0.25 mcg by mouth every other day.  11/04/12  Yes Historical Provider, MD  carvedilol (COREG) 6.25 MG tablet Take 6.25 mg by mouth 2 (two) times daily with a meal.   Yes Historical Provider, MD  clopidogrel (PLAVIX) 75 MG tablet Take 75 mg by mouth daily.   Yes Historical Provider, MD  docusate sodium (COLACE) 100 MG capsule Take 100 mg by mouth daily as needed for mild constipation.   Yes Historical Provider, MD  furosemide (LASIX) 80 MG tablet Take 80-160 mg by mouth 2 (two) times daily. Take 2 tablets in the morning and 1 tablet in the afternoon   Yes Historical Provider, MD  gabapentin (NEURONTIN) 300 MG capsule Take 300 mg by mouth 3 (three) times daily.   Yes Historical Provider, MD  insulin glargine (LANTUS) 100 UNIT/ML injection Inject 0.22 mLs (22 Units total) into the skin daily. 08/23/13  Yes Carlos Madera, MD  isosorbide mononitrate (IMDUR) 60 MG 24 hr tablet Take 60 mg by mouth daily.   Yes Historical Provider, MD  levothyroxine (SYNTHROID, LEVOTHROID) 75 MCG tablet Take 75 mcg by mouth daily before breakfast.   Yes Historical Provider, MD  multivitamin (RENA-VIT) TABS tablet Take 1 tablet by mouth at bedtime. 01/15/13  Yes Gokul Krishnan, MD    nitroGLYCERIN (NITROSTAT) 0.4 MG SL tablet Place 0.4 mg under the tongue every 5 (five) minutes as needed for chest pain.    Yes Historical Provider, MD  ONGLYZA 5 MG TABS tablet Take 5 mg by mouth daily.  11/22/12  Yes Historical Provider, MD  pravastatin (PRAVACHOL) 40 MG tablet Take 40 mg by mouth daily.   Yes Historical Provider, MD  RENVELA 800 MG tablet Take 800 mg by mouth 3 (three) times daily with meals.  07/04/13  Yes Historical Provider, MD  travoprost, benzalkonium, (TRAVATAN) 0.004 % ophthalmic solution Place 1 drop into both eyes at bedtime.    Yes Historical Provider, MD  feeding supplement, GLUCERNA SHAKE, (GLUCERNA SHAKE) LIQD Take 237 mLs by mouth daily as needed (PLease offer to pt if meal intake is less than 50%). 08/23/13   Carlos Madera, MD  furosemide (LASIX) 80 MG tablet TAKE 1 TABLET (80 MG TOTAL) BY MOUTH 2 (TWO) TIMES DAILY. Patient not taking: Reported on 04/14/2014 03/17/14   Kenneth C. Hilty, MD  HYDROcodone-acetaminophen (NORCO/VICODIN) 5-325 MG per tablet Take 1 tablet by mouth every 12 (twelve) hours as needed for severe pain. Patient not taking: Reported on 03/02/2014 08/23/13   Carlos Madera, MD  ipratropium-albuterol (DUONEB) 0.5-2.5 (3) MG/3ML SOLN Take 3 mLs by nebulization every 6 (six) hours as needed (for shortness of breath).    Historical Provider, MD  oxyCODONE-acetaminophen (ROXICET) 5-325 MG per tablet Take 1 tablet by mouth every 8 (eight) hours as needed for severe pain. Patient not taking: Reported on 04/14/2014 01/22/14   Christopher S Dickson, MD  oxyCODONE-acetaminophen (ROXICET) 5-325 MG per tablet Take 1-2 tablets by mouth every 4 (four) hours as needed for severe pain. Patient not taking: Reported on 04/14/2014 03/25/14   Christopher S Dickson, MD    Scheduled Meds: . ampicillin-sulbactam (UNASYN) IV  1.5 g Intravenous Q12H  . heparin  5,000 Units Subcutaneous 3 times per day  . insulin aspart  0-9 Units Subcutaneous TID WC   Infusions:     PRN  Meds: sodium chloride, acetaminophen (TYLENOL) oral liquid 160 mg/5 mL, acetaminophen, fentaNYL, hydrALAZINE, morphine injection   Allergies as of 04/13/2014 - Review Complete 04/13/2014  Allergen Reaction Noted  . Aspirin Nausea And Vomiting 02/07/2011    Family History  Problem Relation Age of Onset  . Cancer Sister     STOMACH  . Diabetes Sister   . Cancer Brother     BONE  . Diabetes Brother   . Anesthesia problems Neg Hx   . Hypotension Neg Hx   . Malignant hyperthermia Neg Hx   . Pseudochol deficiency Neg Hx   . Hyperlipidemia Daughter   . Hypertension Daughter   . Heart disease Daughter     before age 60  . Kidney disease Daughter   . Other Daughter     varicose veins  . Diabetes Daughter   . Heart disease Son     before age 60  . Hyperlipidemia Son   . Hypertension Son   . Heart attack Son   . Heart attack Daughter   . Heart disease Daughter     Before age 60    History   Social History  . Marital Status: Widowed    Spouse Name: N/A    Number of Children: 4  . Years of Education: N/A   Occupational History  . homemaker    Social History Main Topics  . Smoking status: Former Smoker -- 0.50 packs/day for 40 years    Quit date: 07/05/1986  . Smokeless tobacco: Former User     Comment: quit smoking 1988  . Alcohol Use: No  . Drug Use: No  . Sexual Activity: No   Other Topics Concern  . Not on file   Social History Narrative    REVIEW OF SYSTEMS: Constitutional:  Weight stable. Mobility is from wheelchair to bed and she is able to transfer herself from 1.2 the other. ENT:  No nose bleeds Pulm:  Dyspnea present yesterday is resolved today. CV:  No palpitations, no LE edema.  GU:  No hematuria, no frequency GI:  Per HPI Heme:  No unusual bleeding or bruising   Transfusions:  none Neuro:  No headaches, no peripheral tingling or numbness Derm:  No itching, no rash or sores.  Endocrine:  No sweats or chills.  No polyuria or dysuria.  Blood  glucose at home runs from the 120s to the 150s Immunization:  Not queried Travel:  None beyond local counties in last few months.    PHYSICAL EXAM: Vital signs in last 24 hours: Filed Vitals:   04/14/14 1300  BP: 153/79  Pulse: 95  Temp: 98.6 F (37 C)  Resp: 17   Wt Readings from Last 3 Encounters:  04/14/14 129 lb 6.6 oz (58.7 kg)  03/25/14 126 lb (57.153 kg)  03/17/14 127 lb (57.607 kg)    General: Pleasant, comfortable AAF. She appears slightly cushingoid Head:  Face looks swollen. No asymmetry of the face.  Eyes:  No icterus, no conjunctival pallor. Ears:  Slightly diminished hearing  Nose:  No congestion or discharge Mouth:  Clear, moist. No teeth. Patient unable to fully open her mouth, so unable to view pharynx or posterior palate Neck:  Tenderness focally on the left neck. Perhaps swelling in the region but no discrete mass. Lungs:  Clear bilaterally. She is doing the occasional throat clearing and somewhat gurgly vocal quality. Heart: RRR. No MRG. Abdomen:  Soft, NT, ND. No mass, no HSM. No bruits..   Rectal:   Deferred   Musc/Skeltl: No joint erythema, swelling or contracture. Extremities:  Right arm edema, does not pit. Fistula palpable in region of right antecubital space.  Left BKA. Neurologic:  Appropriate, oriented to place, self, and time. No limb weakness.   Skin:  No rash, sores Tattoos:  None Nodes:  No cervical adenopathy   Psych:  Cooperative, pleasant, relaxed  Intake/Output from previous day: 01/24 0701 - 01/25 0700 In: 155 [I.V.:80; IV Piggyback:75] Out: 890 [Urine:890] Intake/Output this shift: Total I/O In: 62.5 [IV Piggyback:62.5] Out: -   LAB RESULTS:  Recent Labs  04/13/14 1222 04/14/14 0244  WBC 12.3* 11.3*  HGB 11.3* 10.6*  HCT 36.4 34.1*  PLT 334 307   MCV                      87  BMET Lab Results  Component Value Date   NA 143 04/14/2014   NA 137 04/13/2014   NA 140 03/25/2014   K 3.6 04/14/2014   K 4.6 04/13/2014    K 4.0 03/25/2014   CL 104 04/14/2014   CL 99 04/13/2014   CL 103 03/17/2014   CO2 30 04/14/2014   CO2 33* 04/13/2014   CO2 26 03/02/2014   GLUCOSE 129* 04/14/2014   GLUCOSE 126* 04/13/2014   GLUCOSE 124* 03/25/2014   BUN 19 04/14/2014   BUN 22 04/13/2014   BUN 59* 03/17/2014   CREATININE 2.18* 04/14/2014   CREATININE 2.14* 04/13/2014   CREATININE 2.60* 03/17/2014   CALCIUM 8.9 04/14/2014   CALCIUM 9.4 04/13/2014   CALCIUM 9.3 03/02/2014   LFT No results for input(s): PROT, ALBUMIN, AST, ALT, ALKPHOS, BILITOT, BILIDIR, IBILI in the last 72 hours. PT/INR Lab Results  Component Value Date   INR 1.01 02/06/2012   INR 1.05 10/04/2010   INR 1.1 09/28/2007   Hepatitis Panel No results for input(s): HEPBSAG, HCVAB, HEPAIGM, HEPBIGM in the last 72 hours. C-Diff No components found for: CDIFF Lipase     Component Value Date/Time   LIPASE 18 10/13/2008 2303    Drugs of Abuse  No results found for: LABOPIA, COCAINSCRNUR, LABBENZ, AMPHETMU, THCU, LABBARB   RADIOLOGY STUDIES: Ct Soft Tissue Neck Wo Contrast  04/13/2014   CLINICAL DATA:  Drooling with difficulty swallowing. Renal insufficiency precludes IV contrast administration. Initial encounter.  EXAM: CT NECK WITHOUT CONTRAST  TECHNIQUE: Multidetector CT imaging of the neck was performed following the standard protocol without intravenous contrast.  COMPARISON:  Limited correlation is made with a head CT 06/15/2013.  FINDINGS: The oropharyngeal airway is patent. The nasopharyngeal airway is closed without gross thickening of the uvula. There is no evidence of thickening of the epiglottis or tongue base. The larynx appears normal. No focal inflammatory changes or mucosal lesions are identified on non contrast imaging. There is possible mild symmetric prominence of the palatine and lingual tonsils. The proximal esophagus appears grossly unremarkable.  The salivary glands appear unremarkable. The thyroid gland is atrophied. Mild  carotid atherosclerosis and tortuosity are present bilaterally. There is also atherosclerosis of the aorta and great vessels.  There is no evidence of cervical lymphadenopathy or neck mass. Intracranial atrophy is grossly stable. The visualized paranasal sinuses are clear.  Mild linear scarring is present in the right lung. There is no confluent airspace opacity or suspicious pulmonary nodule. Extensive degenerative changes are present throughout the cervical spine associated with a gradual kyphosis. There is probable ankylosis across the C3-4 disc space. TMJ degenerative changes are present, worse   on the left.  IMPRESSION: 1. No specific explanation for the patient's symptoms demonstrated on non contrast imaging. The nasopharyngeal airway is close without obvious abnormality of the uvula. 2. Possible mild thickening of the palatine and lingual tonsils without focal mucosal lesion. ENT evaluation may be warranted. 3. No acute inflammatory changes or enlarged lymph nodes seen. 4. Diffuse cervical spondylosis.   Electronically Signed   By: Bill  Veazey M.D.   On: 04/13/2014 12:34    ENDOSCOPIC STUDIES: 10/2000  EGD Gastritis  10/2000 Colonoscopy "negative"  But PMD office unable to locate actual report.   IMPRESSION:   *  Acute onset dysphagia/odynophagia, sore throat. Direct laryngoscopy 04/13/14 showed possible early epiglottitis/supra-glottitis.  Currently being treated with Unasyn No previous history of dysphagia.  Interestingly, one month ago she developed acute onset swelling in her right arm, breast, neck and face due to occlusion of AV fistula .. Dr. Dickson performed ligation of the right upper arm AV graft on 03/25/14. Since then the fascial/neck/breast swelling has improved but she has persistent swelling in the right arm. This swelling was not associated with any swallowing problem but one wonders whether these recent events have any association with her acute dys/odynophagia?  *  Late stage IV  CKD.      PLAN:     *  Given hx gastritis, will start her on Protonix once daily.  *  Set up for EGD tomorrow at 11 AM.    [name]  04/14/2014, 2:24 PM Pager: [phone]     ________________________________________________________________________  [facility] MD note:  I personally examined the patient, reviewed the data and agree with the assessment and plan described above.  Would be a pretty unusual presentation for esophageal food impaction.  Story almost invariably feeling a food getting caught during a meal, patients can recall the exact swallow that it started.  Given clinical uncertainty and the fact that she is not improving, will proceed with EGD (scheduled for tomorrow with MAC support likely).   Shannon Boyack, MD [facility] Pager [phone]  

## 2014-04-14 NOTE — Progress Notes (Signed)
PULMONARY / CRITICAL CARE MEDICINE   Name: [name] MRN: [id number] DOB: [birth year]    ADMISSION DATE:  04/13/2014 CONSULTATION DATE:  04/13/14  REFERRING MD :  ENT  CHIEF COMPLAINT:  Difficulty swallowing/sore throat/drooling  INITIAL PRESENTATION: [name] is a 81yo woman w/ multiple comorbidities who presented with 2 day hx of sore throat, dysphagia, and odynophagia with drooling and difficulty breathing this morning. ENT evaluated in the ED and PCCM asked to admit for presumed early epiglottitis.   STUDIES:  CT Neck 1/24>> Mild thickening of palatine and lingual tonsils without focal mucosal lesion. Debris noted in esophagus.  Laryngoscopy (ENT) 1/24>> Normal epiglottis. Some swelling of aryepiglottic folds and post cricoid mucosa. Pooling of secretions in pyriforms. Vocal cords mobile.   SIGNIFICANT EVENTS: 1/24>> Presents with 2 day hx of sore throat and difficulty swallowing with drooling today. Evaluated by ENT in ED, likely early epiglottitis/supraglottitis. Admitted to ICU.    SUBJECTIVE: Still complains of left-sided neck pain. Having copious amount of secretions per nurse.   VITAL SIGNS: Temp:  [97.1 F (36.2 C)-99 F (37.2 C)] 97.6 F (36.4 C) (01/25 0348) Pulse Rate:  [83-106] 104 (01/25 0600) Resp:  [12-28] 22 (01/25 0600) BP: (113-192)/(40-101) 160/85 mmHg (01/25 0600) SpO2:  [88 %-100 %] 100 % (01/25 0600) Weight:  [57.7 kg (127 lb 3.3 oz)-58.7 kg (129 lb 6.6 oz)] 58.7 kg (129 lb 6.6 oz) (01/25 0500) HEMODYNAMICS:     INTAKE / OUTPUT:  Intake/Output Summary (Last 24 hours) at 04/14/14 0656 Last data filed at 04/14/14 0600  Gross per 24 hour  Intake  142.5 ml  Output    770 ml  Net -627.5 ml    PHYSICAL EXAMINATION: General: alert, laying in bed, suctioning saliva from mouth Neuro: alert and oriented x 3, follows commands, speaks in full sentences  HEENT: [place]/AT, EOMI Neck: Tenderness to palpation of left side of neck. No masses or nodules  palpated.  Cardiovascular: tachycardic in 100s, no m/g/r Lungs: coarse upper airway breath sounds Abdomen: BS+, soft, non-tender Musculoskeletal: warm, trace edema. L BKA appears c/d/i. AV fistula in right upper extremity.  Skin: warm and well perfused   LABS:  CBC  Recent Labs Lab 04/13/14 1222 04/14/14 0244  WBC 12.3* 11.3*  HGB 11.3* 10.6*  HCT 36.4 34.1*  PLT 334 307   Coag's No results for input(s): APTT, INR in the last 168 hours. BMET  Recent Labs Lab 04/13/14 1222 04/14/14 0244  NA 137 143  K 4.6 3.6  CL 99 104  CO2 33* 30  BUN 22 19  CREATININE 2.14* 2.18*  GLUCOSE 126* 129*   Electrolytes  Recent Labs Lab 04/13/14 1222 04/14/14 0244  CALCIUM 9.4 8.9   Sepsis Markers No results for input(s): LATICACIDVEN, PROCALCITON, O2SATVEN in the last 168 hours. ABG No results for input(s): PHART, PCO2ART, PO2ART in the last 168 hours. Liver Enzymes No results for input(s): AST, ALT, ALKPHOS, BILITOT, ALBUMIN in the last 168 hours. Cardiac Enzymes No results for input(s): TROPONINI, PROBNP in the last 168 hours. Glucose  Recent Labs Lab 04/13/14 1552 04/13/14 2001 04/13/14 2219  GLUCAP 139* 129* 121*    Imaging Ct Soft Tissue Neck Wo Contrast  04/13/2014   CLINICAL DATA:  Drooling with difficulty swallowing. Renal insufficiency precludes IV contrast administration. Initial encounter.  EXAM: CT NECK WITHOUT CONTRAST  TECHNIQUE: Multidetector CT imaging of the neck was performed following the standard protocol without intravenous contrast.  COMPARISON:  Limited correlation is made with a  head CT 06/15/2013.  FINDINGS: The oropharyngeal airway is patent. The nasopharyngeal airway is closed without gross thickening of the uvula. There is no evidence of thickening of the epiglottis or tongue base. The larynx appears normal. No focal inflammatory changes or mucosal lesions are identified on non contrast imaging. There is possible mild symmetric prominence of the  palatine and lingual tonsils. The proximal esophagus appears grossly unremarkable.  The salivary glands appear unremarkable. The thyroid gland is atrophied. Mild carotid atherosclerosis and tortuosity are present bilaterally. There is also atherosclerosis of the aorta and great vessels.  There is no evidence of cervical lymphadenopathy or neck mass. Intracranial atrophy is grossly stable. The visualized paranasal sinuses are clear.  Mild linear scarring is present in the right lung. There is no confluent airspace opacity or suspicious pulmonary nodule. Extensive degenerative changes are present throughout the cervical spine associated with a gradual kyphosis. There is probable ankylosis across the C3-4 disc space. TMJ degenerative changes are present, worse on the left.  IMPRESSION: 1. No specific explanation for the patient's symptoms demonstrated on non contrast imaging. The nasopharyngeal airway is close without obvious abnormality of the uvula. 2. Possible mild thickening of the palatine and lingual tonsils without focal mucosal lesion. ENT evaluation may be warranted. 3. No acute inflammatory changes or enlarged lymph nodes seen. 4. Diffuse cervical spondylosis.   Electronically Signed   By: Roxy Horseman M.D.   On: 04/13/2014 12:34     ASSESSMENT / PLAN:  PULMONARY A: ?Early epiglottitis/supraglottitis vs. Food impaction- debris in esophagus noted on CT neck  P:   Continue Zosyn Monitor respiratory status Oxygen therapy as needed Blood cultures x 2 pending ENT following, appreciate recommendations  Consult GI for possible food impaction noted on CT  CARDIOVASCULAR A: Mild tachycardia- related to acute pain Nonischemic cardiomyopathy Peripheral vascular disease s/p L BKA Chronic diastolic CHF Prolonged QTc HTN P:  Hydralazine 5 mg IV Q6H PRN, can switch to home meds once tolerates PO intake Avoid QT-prolonging medications   RENAL A:  CKD Stage IV P:   Check bmet in  AM  GASTROINTESTINAL A:  ?Food impaction  GERD P:   Consult GI for possible scope Keep NPO for now  HEMATOLOGIC A:  Anemia without blood loss P:  CBC intermittently  VTE PPx- heparin   INFECTIOUS A:  ?Early epiglottitis/supraglottitis- epiglottis normal but swelling of aryepiglottic folds P:   BCx2 1/24>> Rapid strep 1/24>> neg Legionella 1/24>> Strep pneumo 1/24>> neg  Zosyn 1/24>>  Afebrile over last 24 hours, WBC count trending down   ENDOCRINE A:  Type 2 DM Hypothyroidism P:   Sensitive ISS  NEUROLOGIC A: No acute issues P:   Continue to monitor   FAMILY  - Updates: Family updated at bedside 1/24  - Inter-disciplinary family meet or Palliative Care meeting due by: 04/20/14    Rich Number, MD Internal Medicine Resident, PGY-1

## 2014-04-14 NOTE — Progress Notes (Signed)
ANTIBIOTIC CONSULT NOTE - FOLLOW UP  Pharmacy Consult for Unasyn Indication: Possible epiglottitis  Allergies  Allergen Reactions  . Aspirin Nausea And Vomiting    325 mg (adult strength) [name]     Patient Measurements: Height: 4\' 9"  (144.8 cm) Weight: 129 lb 6.6 oz (58.7 kg) IBW/kg (Calculated) : 38.6  Vital Signs: Temp: 98.8 F (37.1 C) (01/25 0818) Temp Source: Oral (01/25 0818) BP: 168/52 mmHg (01/25 0800) Pulse Rate: 94 (01/25 0800) Intake/Output from previous day: 01/24 0701 - 01/25 0700 In: 155 [I.V.:80; IV Piggyback:75] Out: 890 [Urine:890] Intake/Output from this shift: Total I/O In: 12.5 [IV Piggyback:12.5] Out: -   Labs:  Recent Labs  04/13/14 1222 04/14/14 0244  WBC 12.3* 11.3*  HGB 11.3* 10.6*  PLT 334 307  CREATININE 2.14* 2.18*   Estimated Creatinine Clearance: 12.9 mL/min (by C-G formula based on Cr of 2.18). No results for input(s): VANCOTROUGH, VANCOPEAK, VANCORANDOM, GENTTROUGH, GENTPEAK, GENTRANDOM, TOBRATROUGH, TOBRAPEAK, TOBRARND, AMIKACINPEAK, AMIKACINTROU, AMIKACIN in the last 72 hours.   Microbiology: Recent Results (from the past 720 hour(s))  Rapid strep screen     Status: None   Collection Time: 04/13/14  2:12 PM  Result Value Ref Range Status   Streptococcus, Group A Screen (Direct) NEGATIVE NEGATIVE Final    Comment: (NOTE) A Rapid Antigen test may result negative if the antigen level in the sample is below the detection level of this test. The FDA has not cleared this test as a stand-alone test therefore the rapid antigen negative result has reflexed to a Group A Strep culture.   MRSA PCR Screening     Status: None   Collection Time: 04/13/14  4:13 PM  Result Value Ref Range Status   MRSA by PCR NEGATIVE NEGATIVE Final    Comment:        The GeneXpert MRSA Assay (FDA approved for NASAL specimens only), is one component of a comprehensive MRSA  colonization surveillance program. It is not intended to diagnose MRSA infection nor to guide or monitor treatment for MRSA infections.     Anti-infectives    Start     Dose/Rate Route Frequency Ordered Stop   04/13/14 2300  piperacillin-tazobactam (ZOSYN) IVPB 3.375 g  Status:  Discontinued     3.375 g12.5 mL/hr over 240 Minutes Intravenous 3 times per day 04/13/14 1435 04/14/14 0949   04/13/14 1445  piperacillin-tazobactam (ZOSYN) IVPB 3.375 g     3.375 g100 mL/hr over 30 Minutes Intravenous STAT 04/13/14 1435 04/13/14 1544      Assessment: 81 y.o. female presents with sore throat, painful swallowing. CT neg for swelling. Flexible laryngoscopy shows some swelling of aryepiglottic folds and post cricoid mucosa. Pt started on Zosyn yesterday but unsure if it is really epiglotitis. No need for pseudomonal coverage so will change to Unasyn. On day #2 of abx, pt is afebrile and WBC is slightly elevated. SCr is 2.18, CrCl ~40ml/min. Produced about of urine yesterday.  Goal of Therapy:  Resolution of infection  Plan:  Stop Zosyn Start Unasyn 1.5g IV Q12 Monitor renal function closely, clinical picture F/U abx LOT  [name] J 04/14/2014,9:52 AM

## 2014-04-15 ENCOUNTER — Encounter (HOSPITAL_COMMUNITY)
Admission: EM | Disposition: A | Discharge status: Home Health | Payer: Self-pay | Source: Home / Self Care | Attending: Internal Medicine

## 2014-04-15 ENCOUNTER — Inpatient Hospital Stay (HOSPITAL_COMMUNITY): Payer: Medicare Other | Admitting: Anesthesiology

## 2014-04-15 ENCOUNTER — Encounter (HOSPITAL_COMMUNITY): Payer: Self-pay | Admitting: Certified Registered Nurse Anesthetist

## 2014-04-15 DIAGNOSIS — N184 Chronic kidney disease, stage 4 (severe): Secondary | ICD-10-CM

## 2014-04-15 DIAGNOSIS — E1129 Type 2 diabetes mellitus with other diabetic kidney complication: Secondary | ICD-10-CM

## 2014-04-15 DIAGNOSIS — I10 Essential (primary) hypertension: Secondary | ICD-10-CM

## 2014-04-15 DIAGNOSIS — E1122 Type 2 diabetes mellitus with diabetic chronic kidney disease: Secondary | ICD-10-CM

## 2014-04-15 HISTORY — PX: ESOPHAGOGASTRODUODENOSCOPY: SHX5428

## 2014-04-15 LAB — LEGIONELLA ANTIGEN, URINE

## 2014-04-15 LAB — CULTURE, GROUP A STREP

## 2014-04-15 LAB — GLUCOSE, CAPILLARY
GLUCOSE-CAPILLARY: 96 mg/dL (ref 70–99)
Glucose-Capillary: 92 mg/dL (ref 70–99)
Glucose-Capillary: 105 mg/dL — ABNORMAL HIGH (ref 70–99)
Glucose-Capillary: 112 mg/dL — ABNORMAL HIGH (ref 70–99)

## 2014-04-15 SURGERY — EGD (ESOPHAGOGASTRODUODENOSCOPY)
Anesthesia: Monitor Anesthesia Care

## 2014-04-15 MED ORDER — LEVOTHYROXINE SODIUM 100 MCG IV SOLR
37.5000 ug | Freq: Every day | INTRAVENOUS | Status: DC
  Administered 2014-04-15 – 2014-04-16 (×2): 37.5 ug via INTRAVENOUS
  Filled 2014-04-15 (×3): qty 5

## 2014-04-15 MED ORDER — PROPOFOL INFUSION 10 MG/ML OPTIME
INTRAVENOUS | Status: DC | PRN
  Administered 2014-04-15: 50 ug/kg/min via INTRAVENOUS

## 2014-04-15 MED ORDER — PANTOPRAZOLE SODIUM 40 MG IV SOLR
40.0000 mg | Freq: Every day | INTRAVENOUS | Status: DC
  Administered 2014-04-15 – 2014-04-17 (×3): 40 mg via INTRAVENOUS
  Filled 2014-04-15 (×4): qty 40

## 2014-04-15 MED ORDER — METOPROLOL TARTRATE 1 MG/ML IV SOLN
2.5000 mg | Freq: Three times a day (TID) | INTRAVENOUS | Status: DC
  Administered 2014-04-15 – 2014-04-16 (×4): 2.5 mg via INTRAVENOUS
  Filled 2014-04-15 (×6): qty 5

## 2014-04-15 MED ORDER — LACTATED RINGERS IV SOLN: INTRAVENOUS | Status: DC

## 2014-04-15 MED ORDER — SODIUM CHLORIDE 0.9 % IV SOLN
INTRAVENOUS | Status: DC
  Administered 2014-04-15: via INTRAVENOUS

## 2014-04-15 NOTE — H&P (View-Only) (Signed)
[facility] Consult: 2:24 PM 04/14/2014  LOS: 1 day    Referring Provider: Dr Kendrick Fries.   Primary Care Physician:  Georgianne Fick, MD Primary Gastroenterologist:  Dr.Orr     Reason for Consultation:  Dysphagia   HPI: [name] is a 81 y.o. female.  PMHx of CVA, nonischemic cardiomyopathy, Type 2 DM, PVD s/p L BKA, stage 4 CKD s/p placement of dialysis graft but not on HD, diastolic CHF, TIA. Left BKA.  Chronic meds include 81 ASA, Plavix but no PPI/H2Bs.  Admitted yesterday with 2 days sore throat, dysphagia, odynophagia, SOB.  Drooling on exam.  Laryngoscopy 1/24 showed normal epiglottis, some swelling of periepiglottic folds and post cricoid mucosa. Pooling of secretions in pyriforms.  Today she  is still not able to swallow.   CT neck: Possible mild thickening of the palatine and lingual tonsils without focal mucosal lesion.  This morning her throat still feels sore and when challenged with swallowing ice chips, she is having difficulty managing even this limited volume of po's.  Prior to these events she was swallowing well, eating well and had no GI issues.  2 or 3 weeks ago the patient developed sudden onset swelling in the right arm into the neck bilaterally as well as her face and right breast. On 03/25/14, vascular surgery Dr. Durwin Nora performed ligation of her right upper arm AV graft due to significant venous hypertension owing to central venous occlusion.  Since that time the swelling in her face, neck and breast have resolved, however she still has swelling in her right arm.  Patient was given a course of antibiotics for a mass, possibly an abscess, in the right breast.  She underwent breast ultrasound on 03/31/14 which showed marked thickening of the skin and generalized increase in the parenchymal  markings in the right breast. There was no definite mass, just generalized edema in the retroareolar right breast. Punch biopsy was recommended but no appointment yet set up for that.    In 2002 had gastritis on EGD, apparently normal colonoscopy    Past Medical History  Diagnosis Date  . Coronary artery disease     a. Cath 09/2010 - med rx.  Marland Kitchen TIA (transient ischemic attack)   . Hypertension   . Shingles   . Stroke   . Anemia   . Cardiomyopathy, idiopathic 02/08/2011  . Myocardial infarct     x 3 unsure of years  . Irregular heartbeat   . Ulcer   . GERD (gastroesophageal reflux disease)   . Hx of transient ischemic attack (TIA)   . Memory loss   . Chronic diastolic CHF (congestive heart failure)     Takes Lasix  . Gout     takes allopurinol  . Peripheral vascular disease     a. s/p L BKA.  . Diabetes mellitus     type 2 NIDDM  . Chronic kidney disease (CKD), stage IV (severe)   . Arthritis   . Hypothyroidism     (SEVERE) Takes Levothryroxine  . Nonischemic cardiomyopathy     EF  now is 55%, reduced due to myxedema, which is improved.  . Dyslipidemia   . Peripheral neuropathy   . H/O echocardiogram 09/06/11    Indication- nonIschemic Cardiomyopathy. EF = now greater than 55% with no regional wall motion abnormalities. Tthere is mild to moderate trisuspid regurgitayion and mild pulmonary hypertension with an RVSP of 35 mmHg as well as stage 1 diastolic dysfunction and mild to moderate LVH.  Marland Kitchen Abnormal nuclear stress test 06/01/09    Demonstrated a new area of infarct scar, peri-infarct ischemia seen in the inferolateral territory. EF eas normal at 70% with mild hypocontractility at the apex, distal inferolateral wall.    Past Surgical History  Procedure Laterality Date  . Back surgery      [facility]  . Eye surgery      Left eye surgery; cataract removal  . Left bka  07/05/2011  . Av fistula placement    . Amputation  07/05/2011    Procedure: AMPUTATION BELOW  KNEE;  Surgeon: Chuck Hint, MD;  Location: [facility] OR;  Service: Vascular;  Laterality: Left;  . Angioplasty  1988  . Cardiac catheterization  09/28/07    Demonstrated multiple sequential lesions around 40 to 30% in the RCA territory.  . Left lower extremity venous duplex Left 06/27/11    Summary: No evidence of DVT involving the left lower extremity and right common femoral vein.   . Lower extremity arterial evaluation  06/27/11    SUMMARY: Right: ABI not ascertained due to false elevation in BP secondary to calcification (posterior tibial artery is non compressible). Left: ABI indicates moderate reduction in arterial flow. Bilateral: Great toe PPG waveforms indicate adequate perfusion. Great toe pressures not obtained due to patient's movements secondary to pain.  . Duplex doppler  05/10/11    LE arterial dopplers demonstrate bilaterally reduced ABIs of 0.91 on right & 0.56 on left. She does report some decreased pain on the left, & there's moderate mixed-density plaque in the right SFA w/50 to 69% reduction. There's a 69% reduction in the left SFA & does appear to be occlusive disease of left posterior tibial artery. Right posterior dorsalis pedis artery demonstrates occlusive disease  . Insertion of dialysis catheter N/A 01/06/2013    Procedure: INSERTION OF DIALYSIS CATHETER;  Surgeon: Larina Earthly, MD;  Location: [facility] OR;  Service: Vascular;  Laterality: N/A;  . Av fistula placement Right 12/24/2013    Procedure: INSERTION OF ARTERIOVENOUS (AV) GORE-TEX GRAFT ARM;  Surgeon: Chuck Hint, MD;  Location: [facility] OR;  Service: Vascular;  Laterality: Right;  . Lower extremity angiogram N/A 10/31/2011    Procedure: LOWER EXTREMITY ANGIOGRAM;  Surgeon: Chuck Hint, MD;  Location: [facility] CATH LAB;  Service: Cardiovascular;  Laterality: N/A;  . Shuntogram N/A 03/17/2014    Procedure: Betsey Amen;  Surgeon: Fransisco Hertz, MD;  Location: [facility] CATH LAB;  Service: Cardiovascular;  Laterality: N/A;  .  Ligation arteriovenous gortex graft Right 03/25/2014    Procedure: LIGATION ARTERIOVENOUS GORTEX GRAFT;  Surgeon: Chuck Hint, MD;  Location: [facility] OR;  Service: Vascular;  Laterality: Right;    Prior to Admission medications   Medication Sig Start Date End Date Taking? Authorizing Provider  acetaminophen (TYLENOL) 325 MG tablet Take 650 mg by mouth every 6 (six) hours as needed for mild pain.   Yes Historical Provider, MD  allopurinol (ZYLOPRIM) 100 MG tablet Take 200 mg by mouth daily.    Yes Historical Provider, MD  amLODipine (NORVASC) 10 MG tablet  Take 10 mg by mouth daily.   Yes Historical Provider, MD  aspirin EC 81 MG tablet Take 81 mg by mouth daily.   Yes Historical Provider, MD  calcitRIOL (ROCALTROL) 0.25 MCG capsule Take 0.25 mcg by mouth every other day.  11/04/12  Yes Historical Provider, MD  carvedilol (COREG) 6.25 MG tablet Take 6.25 mg by mouth 2 (two) times daily with a meal.   Yes Historical Provider, MD  clopidogrel (PLAVIX) 75 MG tablet Take 75 mg by mouth daily.   Yes Historical Provider, MD  docusate sodium (COLACE) 100 MG capsule Take 100 mg by mouth daily as needed for mild constipation.   Yes Historical Provider, MD  furosemide (LASIX) 80 MG tablet Take 80-160 mg by mouth 2 (two) times daily. Take 2 tablets in the morning and 1 tablet in the afternoon   Yes Historical Provider, MD  gabapentin (NEURONTIN) 300 MG capsule Take 300 mg by mouth 3 (three) times daily.   Yes Historical Provider, MD  insulin glargine (LANTUS) 100 UNIT/ML injection Inject 0.22 mLs (22 Units total) into the skin daily. 08/23/13  Yes [name], MD  isosorbide mononitrate (IMDUR) 60 MG 24 hr tablet Take 60 mg by mouth daily.   Yes Historical Provider, MD  levothyroxine (SYNTHROID, LEVOTHROID) 75 MCG tablet Take 75 mcg by mouth daily before breakfast.   Yes Historical Provider, MD  multivitamin (RENA-VIT) TABS tablet Take 1 tablet by mouth at bedtime. 01/15/13  Yes Osvaldo Shipper, MD    nitroGLYCERIN (NITROSTAT) 0.4 MG SL tablet Place 0.4 mg under the tongue every 5 (five) minutes as needed for chest pain.    Yes Historical Provider, MD  ONGLYZA 5 MG TABS tablet Take 5 mg by mouth daily.  11/22/12  Yes Historical Provider, MD  pravastatin (PRAVACHOL) 40 MG tablet Take 40 mg by mouth daily.   Yes Historical Provider, MD  RENVELA 800 MG tablet Take 800 mg by mouth 3 (three) times daily with meals.  07/04/13  Yes Historical Provider, MD  travoprost, benzalkonium, (TRAVATAN) 0.004 % ophthalmic solution Place 1 drop into both eyes at bedtime.    Yes Historical Provider, MD  feeding supplement, GLUCERNA SHAKE, (GLUCERNA SHAKE) LIQD Take 237 mLs by mouth daily as needed (PLease offer to pt if meal intake is less than 50%). 08/23/13   [name], MD  furosemide (LASIX) 80 MG tablet TAKE 1 TABLET (80 MG TOTAL) BY MOUTH 2 (TWO) TIMES DAILY. Patient not taking: Reported on 04/14/2014 03/17/14   Chrystie Nose, MD  HYDROcodone-acetaminophen (NORCO/VICODIN) 5-325 MG per tablet Take 1 tablet by mouth every 12 (twelve) hours as needed for severe pain. Patient not taking: Reported on 03/02/2014 08/23/13   [name], MD  ipratropium-albuterol (DUONEB) 0.5-2.5 (3) MG/3ML SOLN Take 3 mLs by nebulization every 6 (six) hours as needed (for shortness of breath).    Historical Provider, MD  oxyCODONE-acetaminophen (ROXICET) 5-325 MG per tablet Take 1 tablet by mouth every 8 (eight) hours as needed for severe pain. Patient not taking: Reported on 04/14/2014 01/22/14   Chuck Hint, MD  oxyCODONE-acetaminophen (ROXICET) 5-325 MG per tablet Take 1-2 tablets by mouth every 4 (four) hours as needed for severe pain. Patient not taking: Reported on 04/14/2014 03/25/14   Chuck Hint, MD    Scheduled Meds: . ampicillin-sulbactam (UNASYN) IV  1.5 g Intravenous Q12H  . heparin  5,000 Units Subcutaneous 3 times per day  . insulin aspart  0-9 Units Subcutaneous TID WC   Infusions:  PRN  Meds: sodium chloride, acetaminophen (TYLENOL) oral liquid 160 mg/5 mL, acetaminophen, fentaNYL, hydrALAZINE, morphine injection   Allergies as of 04/13/2014 - Review Complete 04/13/2014  Allergen Reaction Noted  . Aspirin Nausea And Vomiting 02/07/2011    Family History  Problem Relation Age of Onset  . Cancer Sister     STOMACH  . Diabetes Sister   . Cancer Brother     BONE  . Diabetes Brother   . Anesthesia problems Neg Hx   . Hypotension Neg Hx   . Malignant hyperthermia Neg Hx   . Pseudochol deficiency Neg Hx   . Hyperlipidemia Daughter   . Hypertension Daughter   . Heart disease Daughter     before age 42  . Kidney disease Daughter   . Other Daughter     varicose veins  . Diabetes Daughter   . Heart disease Son     before age 61  . Hyperlipidemia Son   . Hypertension Son   . Heart attack Son   . Heart attack Daughter   . Heart disease Daughter     Before age 79    History   Social History  . Marital Status: Widowed    Spouse Name: N/A    Number of Children: 4  . Years of Education: N/A   Occupational History  . homemaker    Social History Main Topics  . Smoking status: Former Smoker -- 0.50 packs/day for 40 years    Quit date: 07/05/1986  . Smokeless tobacco: Former Neurosurgeon     Comment: quit smoking 1988  . Alcohol Use: No  . Drug Use: No  . Sexual Activity: No   Other Topics Concern  . Not on file   Social History Narrative    REVIEW OF SYSTEMS: Constitutional:  Weight stable. Mobility is from wheelchair to bed and she is able to transfer herself from 1.2 the other. ENT:  No nose bleeds Pulm:  Dyspnea present yesterday is resolved today. CV:  No palpitations, no LE edema.  GU:  No hematuria, no frequency GI:  Per HPI Heme:  No unusual bleeding or bruising   Transfusions:  none Neuro:  No headaches, no peripheral tingling or numbness Derm:  No itching, no rash or sores.  Endocrine:  No sweats or chills.  No polyuria or dysuria.  Blood  glucose at home runs from the 120s to the 150s Immunization:  Not queried Travel:  None beyond local counties in last few months.    PHYSICAL EXAM: Vital signs in last 24 hours: Filed Vitals:   04/14/14 1300  BP: 153/79  Pulse: 95  Temp: 98.6 F (37 C)  Resp: 17   Wt Readings from Last 3 Encounters:  04/14/14 129 lb 6.6 oz (58.7 kg)  03/25/14 126 lb (57.153 kg)  03/17/14 127 lb (57.607 kg)    General: Pleasant, comfortable AAF. She appears slightly cushingoid Head:  Face looks swollen. No asymmetry of the face.  Eyes:  No icterus, no conjunctival pallor. Ears:  Slightly diminished hearing  Nose:  No congestion or discharge Mouth:  Clear, moist. No teeth. Patient unable to fully open her mouth, so unable to view pharynx or posterior palate Neck:  Tenderness focally on the left neck. Perhaps swelling in the region but no discrete mass. Lungs:  Clear bilaterally. She is doing the occasional throat clearing and somewhat gurgly vocal quality. Heart: RRR. No MRG. Abdomen:  Soft, NT, ND. No mass, no HSM. No bruits..   Rectal:  Deferred   Musc/Skeltl: No joint erythema, swelling or contracture. Extremities:  Right arm edema, does not pit. Fistula palpable in region of right antecubital space.  Left BKA. Neurologic:  Appropriate, oriented to place, self, and time. No limb weakness.   Skin:  No rash, sores Tattoos:  None Nodes:  No cervical adenopathy   Psych:  Cooperative, pleasant, relaxed  Intake/Output from previous day: 01/24 0701 - 01/25 0700 In: 155 [I.V.:80; IV Piggyback:75] Out: 890 [Urine:890] Intake/Output this shift: Total I/O In: 62.5 [IV Piggyback:62.5] Out: -   LAB RESULTS:  Recent Labs  04/13/14 1222 04/14/14 0244  WBC 12.3* 11.3*  HGB 11.3* 10.6*  HCT 36.4 34.1*  PLT 334 307   MCV                      87  BMET Lab Results  Component Value Date   NA 143 04/14/2014   NA 137 04/13/2014   NA 140 03/25/2014   K 3.6 04/14/2014   K 4.6 04/13/2014    K 4.0 03/25/2014   CL 104 04/14/2014   CL 99 04/13/2014   CL 103 03/17/2014   CO2 30 04/14/2014   CO2 33* 04/13/2014   CO2 26 03/02/2014   GLUCOSE 129* 04/14/2014   GLUCOSE 126* 04/13/2014   GLUCOSE 124* 03/25/2014   BUN 19 04/14/2014   BUN 22 04/13/2014   BUN 59* 03/17/2014   CREATININE 2.18* 04/14/2014   CREATININE 2.14* 04/13/2014   CREATININE 2.60* 03/17/2014   CALCIUM 8.9 04/14/2014   CALCIUM 9.4 04/13/2014   CALCIUM 9.3 03/02/2014   LFT No results for input(s): PROT, ALBUMIN, AST, ALT, ALKPHOS, BILITOT, BILIDIR, IBILI in the last 72 hours. PT/INR Lab Results  Component Value Date   INR 1.01 02/06/2012   INR 1.05 10/04/2010   INR 1.1 09/28/2007   Hepatitis Panel No results for input(s): HEPBSAG, HCVAB, HEPAIGM, HEPBIGM in the last 72 hours. C-Diff No components found for: CDIFF Lipase     Component Value Date/Time   LIPASE 18 10/13/2008 2303    Drugs of Abuse  No results found for: LABOPIA, COCAINSCRNUR, LABBENZ, AMPHETMU, THCU, LABBARB   RADIOLOGY STUDIES: Ct Soft Tissue Neck Wo Contrast  04/13/2014   CLINICAL DATA:  Drooling with difficulty swallowing. Renal insufficiency precludes IV contrast administration. Initial encounter.  EXAM: CT NECK WITHOUT CONTRAST  TECHNIQUE: Multidetector CT imaging of the neck was performed following the standard protocol without intravenous contrast.  COMPARISON:  Limited correlation is made with a head CT 06/15/2013.  FINDINGS: The oropharyngeal airway is patent. The nasopharyngeal airway is closed without gross thickening of the uvula. There is no evidence of thickening of the epiglottis or tongue base. The larynx appears normal. No focal inflammatory changes or mucosal lesions are identified on non contrast imaging. There is possible mild symmetric prominence of the palatine and lingual tonsils. The proximal esophagus appears grossly unremarkable.  The salivary glands appear unremarkable. The thyroid gland is atrophied. Mild  carotid atherosclerosis and tortuosity are present bilaterally. There is also atherosclerosis of the aorta and great vessels.  There is no evidence of cervical lymphadenopathy or neck mass. Intracranial atrophy is grossly stable. The visualized paranasal sinuses are clear.  Mild linear scarring is present in the right lung. There is no confluent airspace opacity or suspicious pulmonary nodule. Extensive degenerative changes are present throughout the cervical spine associated with a gradual kyphosis. There is probable ankylosis across the C3-4 disc space. TMJ degenerative changes are present, worse  on the left.  IMPRESSION: 1. No specific explanation for the patient's symptoms demonstrated on non contrast imaging. The nasopharyngeal airway is close without obvious abnormality of the uvula. 2. Possible mild thickening of the palatine and lingual tonsils without focal mucosal lesion. ENT evaluation may be warranted. 3. No acute inflammatory changes or enlarged lymph nodes seen. 4. Diffuse cervical spondylosis.   Electronically Signed   By: Roxy Horseman M.D.   On: 04/13/2014 12:34    ENDOSCOPIC STUDIES: 10/2000  EGD Gastritis  10/2000 Colonoscopy "negative"  But PMD office unable to locate actual report.   IMPRESSION:   *  Acute onset dysphagia/odynophagia, sore throat. Direct laryngoscopy 04/13/14 showed possible early epiglottitis/supra-glottitis.  Currently being treated with Unasyn No previous history of dysphagia.  Interestingly, one month ago she developed acute onset swelling in her right arm, breast, neck and face due to occlusion of AV fistula .. [name] performed ligation of the right upper arm AV graft on 03/25/14. Since then the fascial/neck/breast swelling has improved but she has persistent swelling in the right arm. This swelling was not associated with any swallowing problem but one wonders whether these recent events have any association with her acute dys/odynophagia?  *  Late stage IV  CKD.      PLAN:     *  Given hx gastritis, will start her on Protonix once daily.  *  Set up for EGD tomorrow at 11 AM.    Jennye Moccasin  04/14/2014, 2:24 PM Pager: [phone]     ________________________________________________________________________  Corinda [facility] MD note:  I personally examined the patient, reviewed the data and agree with the assessment and plan described above.  Would be a pretty unusual presentation for esophageal food impaction.  Story almost invariably feeling a food getting caught during a meal, patients can recall the exact swallow that it started.  Given clinical uncertainty and the fact that she is not improving, will proceed with EGD (scheduled for tomorrow with MAC support likely).   Rob Bunting, MD [facility] Pager [phone]

## 2014-04-15 NOTE — Op Note (Signed)
[facility] [address] [place], [address]   ENDOSCOPY PROCEDURE REPORT  PATIENT: [name], [name]  MR#: [id number] BIRTHDATE: 1923/08/04 , 89  yrs. old GENDER: female ENDOSCOPIST: Rachael Fee, MD REFERRED BY:  Bland Span, MD PROCEDURE DATE:  04/15/2014 PROCEDURE:  EGD, diagnostic ASA CLASS:     Class III INDICATIONS:  acute sore throat, dysphagia, odynophagia. MEDICATIONS: Monitored anesthesia care TOPICAL ANESTHETIC: none  DESCRIPTION OF PROCEDURE: After the risks benefits and alternatives of the procedure were thoroughly explained, informed consent was obtained.  The PENTAX GASTOROSCOPE [id number] endoscope was introduced through the mouth and advanced to the second portion of the duodenum , Without limitations.  The instrument was slowly withdrawn as the mucosa was fully examined.  There was a non-obstructing Schatzki's ring above a short (1cm) hiatal hernia.  The UGI examination was otherwise normal.  The oropharynx, hypopharynx was slightly edematous, erythematous but there were no obvious ulcers, masses in the proximal to the esophagus.  Retroflexed views revealed no abnormalities.     The scope was then withdrawn from the patient and the procedure completed.  COMPLICATIONS: There were no immediate complications.  ENDOSCOPIC IMPRESSION: There was a non-obstructing Schatzki's ring above a short (1cm) hiatal hernia.  The UGI examination was otherwise normal.  The oropharynx, hypopharynx was slightly edematous, erythematous but there were no obvious ulcers, masses in the proximal to the esophagus.  RECOMMENDATIONS: Would observe clinically, defer care to ENT.  Her acute sore throat, swallowing trouble is not from an esophageal issue.   eSigned:  Rachael Fee, MD 04/15/2014 11:19 AM

## 2014-04-15 NOTE — Anesthesia Postprocedure Evaluation (Signed)
  Anesthesia Post-op Note  Patient: [name]  Procedure(s) Performed: Procedure(s): ESOPHAGOGASTRODUODENOSCOPY (EGD) (N/A)  Patient Location: Endoscopy Unit  Anesthesia Type:MAC  Level of Consciousness: awake and alert   Airway and Oxygen Therapy: Patient Spontanous Breathing and Patient connected to nasal cannula oxygen  Post-op Pain: none  Post-op Assessment: Post-op Vital signs reviewed, Patient's Cardiovascular Status Stable, Respiratory Function Stable and Patent Airway  Post-op Vital Signs: Reviewed and stable  Last Vitals:  Filed Vitals:   04/15/14 1121  BP: 165/86  Pulse: 88  Temp: 36.8 C  Resp: 17    Complications: No apparent anesthesia complications

## 2014-04-15 NOTE — Progress Notes (Addendum)
PATIENT DETAILS Name: [name] Age: 81 y.o. Sex: female Date of Birth: 15-Aug-1923 Admit Date: 04/13/2014 Admitting Physician Lupita Leash, MD ZOX:[id number],BJYNW, MD  Subjective: Still complains of dysphagia and odynophagia.  Assessment/Plan: Active Problems:   Odynophagia/dysphagia:?Secondary to epiglottitis. No abnormality seen on endoscopy. Flexible laryngoscopy done by ENT on admission showed swelling of the aryepiglottic folds and post cricoid mucosa.Continue Unasyn, await further recommendations from ENT.    Suspected early epiglottitis: Continuing Unasyn, blood cultures negative, strep throat negative, urine negative for Legionella and streptococcal antigen and ENT following. No hoarseness or stridor on exam.    Chronic diastolic heart failure: Since nothing by mouth, continue with fluids. Currently clinically compensated.     Diabetes: CBGs stable with SSI, Lantus remains on hold.     Stage IV chronic kidney disease: Creatinine close to usual baseline. Has a fistula in place.     History of peripheral vascular disease  status post left BKA.     History of CAD: Moderate by cardiac catheterization in 2013. Antiplatelets currently on hold as NPO. Resume when able     Hypothyroidism: Start IV levothyroxine-half dose of oral dosing     Hypertension: Will start scheduled IV metoprolol, continue with as needed IV hydralazine. Follow and titrate accordingly. Will slowly resume oral medications when able     GERD: PPI     History of known right breast mass: Per patient and family, biopsy scheduled in February  Disposition: Remain inpatient  Antibiotics:  See below.   Anti-infectives    Start     Dose/Rate Route Frequency Ordered Stop   04/14/14 1300  ampicillin-sulbactam (UNASYN) 1.5 g in sodium chloride 0.9 % 50 mL IVPB     1.5 g100 mL/hr over 30 Minutes Intravenous Every 12 hours 04/14/14 0957     04/13/14 2300  piperacillin-tazobactam (ZOSYN)  IVPB 3.375 g  Status:  Discontinued     3.375 g12.5 mL/hr over 240 Minutes Intravenous 3 times per day 04/13/14 1435 04/14/14 0949   04/13/14 1445  piperacillin-tazobactam (ZOSYN) IVPB 3.375 g     3.375 g100 mL/hr over 30 Minutes Intravenous STAT 04/13/14 1435 04/13/14 1544      DVT Prophylaxis: Prophylactic Heparin   Code Status: Full code  Family Communication Spouse at bedside  Procedures:  Flexible endoscopy on 1/24  CONSULTS:  pulmonary/intensive care, GI and ENT  Time spent 40 minutes-which includes 50% of the time with face-to-face with patient/ family and coordinating care related to the above assessment and plan.  MEDICATIONS: Scheduled Meds: . ampicillin-sulbactam (UNASYN) IV  1.5 g Intravenous Q12H  . antiseptic oral rinse  7 mL Mouth Rinse q12n4p  . chlorhexidine  15 mL Mouth Rinse BID  . heparin  5,000 Units Subcutaneous 3 times per day  . insulin aspart  0-9 Units Subcutaneous TID WC  . pantoprazole (PROTONIX) IV  40 mg Intravenous Q0600   Continuous Infusions:  PRN Meds:.sodium chloride, acetaminophen (TYLENOL) oral liquid 160 mg/5 mL, acetaminophen, fentaNYL, hydrALAZINE, morphine injection    PHYSICAL EXAM: Vital signs in last 24 hours: Filed Vitals:   04/15/14 1121 04/15/14 1130 04/15/14 1140 04/15/14 1437  BP: 165/86 164/88 135/93 164/88  Pulse: 88 91 84 88  Temp: 98.2 F (36.8 C)   98.2 F (36.8 C)  TempSrc:    Oral  Resp: Height:      Weight:      SpO2: 100% 95% 99% 100%  Weight change:  Filed Weights   04/13/14 1555 04/14/14 0500  Weight: 57.7 kg (127 lb 3.3 oz) 58.7 kg (129 lb 6.6 oz)   Body mass index is 28 kg/(m^2).   Gen Exam: Awake and alert with clear speech.   Neck: Supple, No JVD.   Chest: B/L Clear.   CVS: S1 S2 Regular, no murmurs.  Abdomen: soft, BS +, non tender, non distended.  Extremities: no edema, lower extremities warm to touch. Neurologic: Non Focal.   Skin: No Rash.   Wounds: N/A.    Intake/Output from previous day:  Intake/Output Summary (Last 24 hours) at 04/15/14 1652 Last data filed at 04/15/14 1300  Gross per 24 hour  Intake    326 ml  Output    685 ml  Net   -359 ml     LAB RESULTS: CBC  Recent Labs Lab 04/13/14 1222 04/14/14 0244  WBC 12.3* 11.3*  HGB 11.3* 10.6*  HCT 36.4 34.1*  PLT 334 307  MCV 87.9 87.7  MCH 27.3 27.2  MCHC 31.0 31.1  RDW 16.8* 16.9*  LYMPHSABS 1.8  --   MONOABS 0.6  --   EOSABS 1.9*  --   BASOSABS 0.0  --     Chemistries   Recent Labs Lab 04/13/14 1222 04/14/14 0244  NA 137 143  K 4.6 3.6  CL 99 104  CO2 33* 30  GLUCOSE 126* 129*  BUN 22 19  CREATININE 2.14* 2.18*  CALCIUM 9.4 8.9    CBG:  Recent Labs Lab 04/14/14 1206 04/14/14 1718 04/14/14 2122 04/15/14 0746 04/15/14 1202  GLUCAP 127* 101* 109* 96 92    GFR Estimated Creatinine Clearance: 12.9 mL/min (by C-G formula based on Cr of 2.18).  Coagulation profile No results for input(s): INR, PROTIME in the last 168 hours.  Cardiac Enzymes No results for input(s): CKMB, TROPONINI, MYOGLOBIN in the last 168 hours.  Invalid input(s): CK  Invalid input(s): POCBNP No results for input(s): DDIMER in the last 72 hours. No results for input(s): HGBA1C in the last 72 hours. No results for input(s): CHOL, HDL, LDLCALC, TRIG, CHOLHDL, LDLDIRECT in the last 72 hours. No results for input(s): TSH, T4TOTAL, T3FREE, THYROIDAB in the last 72 hours.  Invalid input(s): FREET3 No results for input(s): VITAMINB12, FOLATE, FERRITIN, TIBC, IRON, RETICCTPCT in the last 72 hours. No results for input(s): LIPASE, AMYLASE in the last 72 hours.  Urine Studies No results for input(s): UHGB, CRYS in the last 72 hours.  Invalid input(s): UACOL, UAPR, USPG, UPH, UTP, UGL, UKET, UBIL, UNIT, UROB, ULEU, UEPI, UWBC, URBC, UBAC, CAST, UCOM, BILUA  MICROBIOLOGY: Recent Results (from the past 240 hour(s))  Rapid strep screen     Status: None   Collection Time:  04/13/14  2:12 PM  Result Value Ref Range Status   Streptococcus, Group A Screen (Direct) NEGATIVE NEGATIVE Final    Comment: (NOTE) A Rapid Antigen test may result negative if the antigen level in the sample is below the detection level of this test. The FDA has not cleared this test as a stand-alone test therefore the rapid antigen negative result has reflexed to a Group A Strep culture.   Culture, Group A Strep     Status: None   Collection Time: 04/13/14  2:12 PM  Result Value Ref Range Status   Specimen Description THROAT  Final   Special Requests NONE  Final   Culture   Final    No Beta Hemolytic Streptococci Isolated Performed at [facility]    Report Status 04/15/2014 FINAL  Final  Culture, blood (routine x 2)     Status: None (Preliminary result)   Collection Time: 04/13/14  2:30 PM  Result Value Ref Range Status   Specimen Description BLOOD LEFT ARM  Final   Special Requests BOTTLES DRAWN AEROBIC AND ANAEROBIC 5 CC  Final   Culture   Final           BLOOD CULTURE RECEIVED NO GROWTH TO DATE CULTURE WILL BE HELD FOR 5 DAYS BEFORE ISSUING A FINAL NEGATIVE REPORT Performed at [facility]    Report Status PENDING  Incomplete  Culture, blood (routine x 2)     Status: None (Preliminary result)   Collection Time: 04/13/14  3:04 PM  Result Value Ref Range Status   Specimen Description BLOOD LEFT ANTECUBITAL  Final   Special Requests BOTTLES DRAWN AEROBIC ONLY 10CC  Final   Culture   Final           BLOOD CULTURE RECEIVED NO GROWTH TO DATE CULTURE WILL BE HELD FOR 5 DAYS BEFORE ISSUING A FINAL NEGATIVE REPORT Performed at [facility]    Report Status PENDING  Incomplete  MRSA PCR Screening     Status: None   Collection Time: 04/13/14  4:13 PM  Result Value Ref Range Status   MRSA by PCR NEGATIVE NEGATIVE Final    Comment:        The GeneXpert MRSA Assay (FDA approved for NASAL specimens only), is one component of a comprehensive MRSA  colonization surveillance program. It is not intended to diagnose MRSA infection nor to guide or monitor treatment for MRSA infections.     RADIOLOGY STUDIES/RESULTS: Ct Soft Tissue Neck Wo Contrast  04/13/2014   CLINICAL DATA:  Drooling with difficulty swallowing. Renal insufficiency precludes IV contrast administration. Initial encounter.  EXAM: CT NECK WITHOUT CONTRAST  TECHNIQUE: Multidetector CT imaging of the neck was performed following the standard protocol without intravenous contrast.  COMPARISON:  Limited correlation is made with a head CT 06/15/2013.  FINDINGS: The oropharyngeal airway is patent. The nasopharyngeal airway is closed without gross thickening of the uvula. There is no evidence of thickening of the epiglottis or tongue base. The larynx appears normal. No focal inflammatory changes or mucosal lesions are identified on non contrast imaging. There is possible mild symmetric prominence of the palatine and lingual tonsils. The proximal esophagus appears grossly unremarkable.  The salivary glands appear unremarkable. The thyroid gland is atrophied. Mild carotid atherosclerosis and tortuosity are present bilaterally. There is also atherosclerosis of the aorta and great vessels.  There is no evidence of cervical lymphadenopathy or neck mass. Intracranial atrophy is grossly stable. The visualized paranasal sinuses are clear.  Mild linear scarring is present in the right lung. There is no confluent airspace opacity or suspicious pulmonary nodule. Extensive degenerative changes are present throughout the cervical spine associated with a gradual kyphosis. There is probable ankylosis across the C3-4 disc space. TMJ degenerative changes are present, worse on the left.  IMPRESSION: 1. No specific explanation for the patient's symptoms demonstrated on non contrast imaging. The nasopharyngeal airway is close without obvious abnormality of the uvula. 2. Possible mild thickening of the palatine and  lingual tonsils without focal mucosal lesion. ENT evaluation may be warranted. 3. No acute inflammatory changes or enlarged lymph nodes seen. 4. Diffuse cervical spondylosis.   Electronically Signed   By: Roxy Horseman M.D.   On: 04/13/2014 12:34  Mm Digital Diagnostic Bilat  03/31/2014   CLINICAL DATA:  Right breast edema and redness. The patient has had 1 course of antibiotics and states the redness is decreased.  EXAM: DIGITAL DIAGNOSTIC  BILATERAL MAMMOGRAM WITH CAD  ULTRASOUND RIGHT BREAST  COMPARISON:  Jul 24, 2002  ACR Breast Density Category b: There are scattered areas of fibroglandular density.  FINDINGS: Cc and MLO views of bilateral breasts are submitted. There is generalized marked skin thickness of the right breast versus the left. There is generalized increase right breast parenchymal markings compared to the left.  Mammographic images were processed with CAD.  Ultrasound is performed. The ultrasound is limited perform with the patient in the wheelchair as patient is nonambulatory from the wheelchair. There is no definite focal discrete cystic or solid lesion in the retroareolar right breast superior there is generalized edema in the retroareolar right breast.  IMPRESSION: Suspicious findings.  RECOMMENDATION: Surgical consultation for skin punch biopsy to exclude inflammatory breast cancer.  I have discussed the findings and recommendations with the patient. Results were also provided in writing at the conclusion of the visit. If applicable, a reminder letter will be sent to the patient regarding the next appointment.  BI-RADS CATEGORY  4: Suspicious.   Electronically Signed   By: Sherian Rein M.D.   On: 03/31/2014 14:36   US Breast Ltd Uni Right Inc Axilla  03/31/2014   CLINICAL DATA:  Right breast edema and redness. The patient has had 1 course of antibiotics and states the redness is decreased.  EXAM: DIGITAL DIAGNOSTIC  BILATERAL MAMMOGRAM WITH CAD  ULTRASOUND RIGHT BREAST  COMPARISON:  Jul 24, 2002  ACR Breast Density Category b: There are scattered areas of fibroglandular density.  FINDINGS: Cc and MLO views of bilateral breasts are submitted. There is generalized marked skin thickness of the right breast versus the left. There is generalized increase right breast parenchymal markings compared to the left.  Mammographic images were processed with CAD.  Ultrasound is performed. The ultrasound is limited perform with the patient in the wheelchair as patient is nonambulatory from the wheelchair. There is no definite focal discrete cystic or solid lesion in the retroareolar right breast superior there is generalized edema in the retroareolar right breast.  IMPRESSION: Suspicious findings.  RECOMMENDATION: Surgical consultation for skin punch biopsy to exclude inflammatory breast cancer.  I have discussed the findings and recommendations with the patient. Results were also provided in writing at the conclusion of the visit. If applicable, a reminder letter will be sent to the patient regarding the next appointment.  BI-RADS CATEGORY  4: Suspicious.   Electronically Signed   By: Sherian Rein M.D.   On: 03/31/2014 14:36    Jeoffrey Massed, MD  [facility] Pager:[phone]  If 7PM-7AM, please contact night-coverage [email or web address] Password [email or web address] 04/15/2014, 4:52 PM   LOS: 2 days

## 2014-04-15 NOTE — Progress Notes (Signed)
04/15/2014 8:53 AM  [name] [id number]  Hosp Day 3    Temp:  [97.7 F (36.5 C)-98.6 F (37 C)] 98.4 F (36.9 C) (01/26 0501) Pulse Rate:  [75-101] 96 (01/26 0501) Resp:  [15-19] 16 (01/26 0501) BP: (153-180)/(61-79) 172/74 mmHg (01/26 0501) SpO2:  [94 %-100 %] 97 % (01/26 0501),     Intake/Output Summary (Last 24 hours) at 04/15/14 0853 Last data filed at 04/15/14 7262  Gross per 24 hour  Intake    426 ml  Output    585 ml  Net   -159 ml    Results for orders placed or performed during the hospital encounter of 04/13/14 (from the past 24 hour(s))  Glucose, capillary     Status: Abnormal   Collection Time: 04/14/14 12:06 PM  Result Value Ref Range   Glucose-Capillary 127 (H) 70 - 99 mg/dL  Glucose, capillary     Status: Abnormal   Collection Time: 04/14/14  5:18 PM  Result Value Ref Range   Glucose-Capillary 101 (H) 70 - 99 mg/dL  Glucose, capillary     Status: Abnormal   Collection Time: 04/14/14  9:22 PM  Result Value Ref Range   Glucose-Capillary 109 (H) 70 - 99 mg/dL  Glucose, capillary     Status: None   Collection Time: 04/15/14  7:46 AM  Result Value Ref Range   Glucose-Capillary 96 70 - 99 mg/dL   Comment 1 Notify RN     SUBJECTIVE:  Still can't swallow.  Not much pain.  No breathing difficulty.  OBJECTIVE:  Voice phonatory.  Pronunciation clearer.  No stridor  IMPRESSION:  Improved.  PLAN:  For UGI endoscopy this AM.  Will be interested to see what they find.  No change in plans.    Flo Shanks

## 2014-04-15 NOTE — Transfer of Care (Signed)
Immediate Anesthesia Transfer of Care Note  Patient: [name]  Procedure(s) Performed: Procedure(s): ESOPHAGOGASTRODUODENOSCOPY (EGD) (N/A)  Patient Location: Endoscopy Unit  Anesthesia Type:MAC  Level of Consciousness: awake and alert   Airway & Oxygen Therapy: Patient Spontanous Breathing and Patient connected to nasal cannula oxygen  Post-op Assessment: Report given to PACU RN and Post -op Vital signs reviewed and stable  Post vital signs: Reviewed and stable  Complications: No apparent anesthesia complications

## 2014-04-15 NOTE — Interval H&P Note (Signed)
History and Physical Interval Note:  04/15/2014 10:27 AM  [name]  has presented today for surgery, with the diagnosis of dysphagia, odynophagia.  The various methods of treatment have been discussed with the patient and family. After consideration of risks, benefits and other options for treatment, the patient has consented to  Procedure(s): ESOPHAGOGASTRODUODENOSCOPY (EGD) (N/A) as a surgical intervention .  The patient's history has been reviewed, patient examined, no change in status, stable for surgery.  I have reviewed the patient's chart and labs.  Questions were answered to the patient's satisfaction.     Rachael Fee

## 2014-04-16 ENCOUNTER — Encounter (HOSPITAL_COMMUNITY): Payer: Self-pay | Admitting: Gastroenterology

## 2014-04-16 DIAGNOSIS — E039 Hypothyroidism, unspecified: Secondary | ICD-10-CM

## 2014-04-16 LAB — BASIC METABOLIC PANEL
Anion gap: 13 (ref 5–15)
BUN: 22 mg/dL (ref 6–23)
CO2: 23 mmol/L (ref 19–32)
CREATININE: 2.14 mg/dL — AB (ref 0.50–1.10)
Calcium: 8.9 mg/dL (ref 8.4–10.5)
Chloride: 111 mmol/L (ref 96–112)
GFR calc Af Amer: 22 mL/min — ABNORMAL LOW (ref >90)
GFR calc non Af Amer: 19 mL/min — ABNORMAL LOW (ref >90)
GLUCOSE: 106 mg/dL — AB (ref 70–99)
Potassium: 4 mmol/L (ref 3.5–5.1)
Sodium: 147 mmol/L — ABNORMAL HIGH (ref 135–145)

## 2014-04-16 LAB — GLUCOSE, CAPILLARY
GLUCOSE-CAPILLARY: 123 mg/dL — AB (ref 70–99)
Glucose-Capillary: 88 mg/dL (ref 70–99)
Glucose-Capillary: 99 mg/dL (ref 70–99)
Glucose-Capillary: 121 mg/dL — ABNORMAL HIGH (ref 70–99)

## 2014-04-16 LAB — CBC
HCT: 37.3 % (ref 36.0–46.0)
HEMOGLOBIN: 11.2 g/dL — AB (ref 12.0–15.0)
MCH: 26.8 pg (ref 26.0–34.0)
MCHC: 30 g/dL (ref 30.0–36.0)
MCV: 89.2 fL (ref 78.0–100.0)
Platelets: 322 10*3/uL (ref 150–400)
RBC: 4.18 MIL/uL (ref 3.87–5.11)
RDW: 17.2 % — ABNORMAL HIGH (ref 11.5–15.5)
WBC: 9.1 10*3/uL (ref 4.0–10.5)

## 2014-04-16 MED ORDER — LIDOCAINE VISCOUS 2 % MT SOLN
15.0000 mL | Freq: Once | OROMUCOSAL | Status: DC
  Filled 2014-04-16 (×2): qty 15

## 2014-04-16 MED ORDER — SODIUM CHLORIDE 0.45 % IV SOLN
INTRAVENOUS | Status: DC
  Administered 2014-04-16: 18:00:00 via INTRAVENOUS

## 2014-04-16 MED ORDER — METOPROLOL TARTRATE 1 MG/ML IV SOLN
5.0000 mg | Freq: Three times a day (TID) | INTRAVENOUS | Status: DC
  Administered 2014-04-16 – 2014-04-17 (×2): 5 mg via INTRAVENOUS
  Filled 2014-04-16 (×5): qty 5

## 2014-04-16 NOTE — Progress Notes (Signed)
04/16/2014 10:24 AM  [name] [id number]  Hosp  Day 4    Temp:  [97.9 F (36.6 C)-98.2 F (36.8 C)] 97.9 F (36.6 C) (01/27 0500) Pulse Rate:  [84-95] 95 (01/27 0500) Resp:  [11-18] 16 (01/27 0500) BP: (135-165)/(67-93) 164/67 mmHg (01/27 0500) SpO2:  [95 %-100 %] 100 % (01/27 0500),     Intake/Output Summary (Last 24 hours) at 04/16/14 1024 Last data filed at 04/16/14 0558  Gross per 24 hour  Intake 1062.5 ml  Output    200 ml  Net  862.5 ml    Results for orders placed or performed during the hospital encounter of 04/13/14 (from the past 24 hour(s))  Glucose, capillary     Status: None   Collection Time: 04/15/14 12:02 PM  Result Value Ref Range   Glucose-Capillary 92 70 - 99 mg/dL   Comment 1 Notify RN   Glucose, capillary     Status: Abnormal   Collection Time: 04/15/14  5:15 PM  Result Value Ref Range   Glucose-Capillary 105 (H) 70 - 99 mg/dL  Glucose, capillary     Status: Abnormal   Collection Time: 04/15/14 10:17 PM  Result Value Ref Range   Glucose-Capillary 112 (H) 70 - 99 mg/dL   Comment 1 Notify RN   CBC     Status: Abnormal   Collection Time: 04/16/14  4:08 AM  Result Value Ref Range   WBC 9.1 4.0 - 10.5 K/uL   RBC 4.18 3.87 - 5.11 MIL/uL   Hemoglobin 11.2 (L) 12.0 - 15.0 g/dL   HCT 75.3 00.5 - 11.0 %   MCV 89.2 78.0 - 100.0 fL   MCH 26.8 26.0 - 34.0 pg   MCHC 30.0 30.0 - 36.0 g/dL   RDW 21.1 (H) 17.3 - 56.7 %   Platelets 322 150 - 400 K/uL  Basic metabolic panel     Status: Abnormal   Collection Time: 04/16/14  4:08 AM  Result Value Ref Range   Sodium 147 (H) 135 - 145 mmol/L   Potassium 4.0 3.5 - 5.1 mmol/L   Chloride 111 96 - 112 mmol/L   CO2 23 19 - 32 mmol/L   Glucose, Bld 106 (H) 70 - 99 mg/dL   BUN 22 6 - 23 mg/dL   Creatinine, Ser 0.14 (H) 0.50 - 1.10 mg/dL   Calcium 8.9 8.4 - 10.3 mg/dL   GFR calc non Af Amer 19 (L) >90 mL/min   GFR calc Af Amer 22 (L) >90 mL/min   Anion gap 13 5 - 15  Glucose, capillary     Status: None    Collection Time: 04/16/14  7:33 AM  Result Value Ref Range   Glucose-Capillary 88 70 - 99 mg/dL    SUBJECTIVE:  Breathing OK.  Voice OK.  Swallowing a little bit.  Less throat pain.  UGI endo yest without significant findings as regards sudden onset throat pain and dysphagia  OBJECTIVE:  Voice cl.  Breathing well.    IMPRESSION:  Satisfactory check.  Not entirely sure if this could have been a pill erosion, but no findings on UGI endo.  No food impaction.  PLAN:  Advance diet and activity.  I will repeat flexible laryngscopy in AM.  Can plan discharge home when able to take po fluids well.  Flo Shanks

## 2014-04-16 NOTE — Evaluation (Signed)
Physical Therapy Evaluation Patient Details Name: [name] MRN: [id number] DOB: 1923-08-29 Today's Date: 04/16/2014   History of Present Illness  Patient is a 81 y/o female w/ multiple comorbidities who presented with 2 day hx of sore throat, dysphagia, and odynophagia with drooling and difficulty breathing. ENT evaluated in the ED and PCCM asked to admit for presumed early epiglottitis. CT Neck 1/24- Mild thickening of palatine and lingual tonsils without focal mucosal lesion. Workup pending.  Clinical Impression  Patient presents with functional limitations due to deficits listed in PT problem list (see below). Pt with generalized weakness, deconditioning and balance deficits impacting safe mobility. Pt independent for transfers PTA and now requires Min A for balance/safety. Pt would benefit from skilled PT to improve transfers, balance and mobility so pt can maximize independence and minimize fall risk prior to return home. Will need 24/7 supervision from son for transfers.    Follow Up Recommendations Home health PT;Supervision/Assistance - 24 hour    Equipment Recommendations  None recommended by PT    Recommendations for Other Services OT consult     Precautions / Restrictions Precautions Precautions: Fall Precaution Comments: L BKA Restrictions Weight Bearing Restrictions: No      Mobility  Bed Mobility Overal bed mobility: Needs Assistance Bed Mobility: Supine to Sit     Supine to sit: Min guard;HOB elevated     General bed mobility comments: Use of rails and increased time/effort to get to EOB. + secretions.  Transfers Overall transfer level: Needs assistance Equipment used: Rolling walker (2 wheeled);None Transfers: Sit to/from [employer] to Stand: Min assist Stand pivot transfers: Min assist       General transfer comment: Min A to rise from EOB and from chair for peri hygiene. Min A for SPT bed to chair. Unsteady.    Ambulation/Gait Ambulation/Gait assistance:  (Pt non ambulatory at baseline.)              Stairs            Wheelchair Mobility    Modified Rankin (Stroke Patients Only)       Balance Overall balance assessment: Needs assistance;History of Falls Sitting-balance support: Feet supported;Bilateral upper extremity supported Sitting balance-Leahy Scale: Fair Sitting balance - Comments: Pt reports, "I amg oing to hit that floor" sitting EOB without support. Min guard for safety.    Standing balance support: During functional activity Standing balance-Leahy Scale: Poor Standing balance comment: Requires UE support during standing and Min-Mod A to maintain static standing balance for peri care.                              Pertinent Vitals/Pain Pain Assessment: No/denies pain    Home Living Family/patient expects to be discharged to:: Private residence Living Arrangements: Children Available Help at Discharge: Family;Available PRN/intermittently Type of Home: House Home Access: Ramped entrance     Home Layout: One level Home Equipment: Bedside commode;Shower seat;Walker - 2 wheels;Wheelchair - power;Wheelchair - manual      Prior Function Level of Independence: Needs assistance   Gait / Transfers Assistance Needed: Pt reports doing transfers independently. Uses power chair vs manual w/c for mobility. reports some falls at home missing chair.   ADL's / Homemaking Assistance Needed: family does all meals and housework, Min A for bathing and able to dress herself        Hand Dominance   Dominant Hand: Right    Extremity/Trunk  Assessment   Upper Extremity Assessment: Defer to OT evaluation           Lower Extremity Assessment: Generalized weakness;LLE deficits/detail   LLE Deficits / Details: BKA.     Communication   Communication: Other (comment) (Weak and raspy vocalization.)  Cognition Arousal/Alertness: Awake/alert Behavior  During Therapy: WFL for tasks assessed/performed Overall Cognitive Status: Within Functional Limits for tasks assessed                      General Comments      Exercises        Assessment/Plan    PT Assessment Patient needs continued PT services  PT Diagnosis Generalized weakness   PT Problem List Decreased strength;Decreased activity tolerance;Decreased balance;Decreased mobility;Decreased safety awareness  PT Treatment Interventions Balance training;Neuromuscular re-education;Patient/family education;Functional mobility training;Therapeutic activities;Wheelchair mobility training;Therapeutic exercise   PT Goals (Current goals can be found in the Care Plan section) Acute Rehab PT Goals Patient Stated Goal: to get something to drink PT Goal Formulation: With patient Time For Goal Achievement: 04/30/14 Potential to Achieve Goals: Fair    Frequency Min 3X/week   Barriers to discharge Decreased caregiver support      Co-evaluation               End of Session Equipment Utilized During Treatment: Gait belt Activity Tolerance: Patient tolerated treatment well;Patient limited by fatigue Patient left: in chair;with call bell/phone within reach;with nursing/sitter in room Nurse Communication: Mobility status         Time: 1110-1131 PT Time Calculation (min) (ACUTE ONLY): 21 min   Charges:   PT Evaluation $Initial PT Evaluation Tier I: 1 Procedure     PT G CodesAlvie Heidelberg A 2014/05/01, 11:49 AM Alvie Heidelberg, PT, DPT [phone]

## 2014-04-16 NOTE — Progress Notes (Signed)
PATIENT DETAILS Name: [name] Age: 81 y.o. Sex: female Date of Birth: October 22, 1923 Admit Date: 04/13/2014 Admitting Physician Lupita Leash, MD BJY:[id number],VHQIO, MD  Subjective: Thinks dysphagia/odynophagia is better   Assessment/Plan: Active Problems:   Odynophagia/dysphagia:?Secondary to epiglottitis. No abnormality seen on endoscopy. Flexible laryngoscopy done by ENT on admission showed swelling of the aryepiglottic folds and post cricoid mucosa.Continue Unasyn, seen by ENT today, recommendations are to advance diet, plans are for repeat flexible laryngoscopy in a.m.    Suspected early epiglottitis: Continuing Unasyn, blood cultures negative, strep throat negative, urine negative for Legionella and streptococcal antigen and ENT following. No hoarseness or stridor on exam.    Chronic diastolic heart failure: Since diet being advanced slowly, will decrease IV fluids. Remains clinically compensated. Continue to monitor closely    Diabetes: CBGs stable with SSI, Lantus remains on hold.    Stage IV chronic kidney disease: Creatinine close to usual baseline. Has a fistula in place.    History of peripheral vascular disease  status post left BKA.    History of CAD: Moderate by cardiac catheterization in 2013. Antiplatelets currently on hold as NPO. Resume when able    Hypothyroidism: Start IV levothyroxine-half dose of oral dosing    Hypertension: Continue scheduled IV metoprolol-but increased to 5 mg every 8 hours, continue with as needed IV hydralazine. Follow and titrate accordingly. Will slowly resume oral medications when able     GERD: Continue with PPI     History of known right breast mass: Per patient and family, biopsy scheduled in February  Disposition: Remain inpatient  Antibiotics:  See below.   Anti-infectives    Start     Dose/Rate Route Frequency Ordered Stop   04/14/14 1300  ampicillin-sulbactam (UNASYN) 1.5 g in sodium chloride 0.9 %  50 mL IVPB     1.5 g100 mL/hr over 30 Minutes Intravenous Every 12 hours 04/14/14 0957     04/13/14 2300  piperacillin-tazobactam (ZOSYN) IVPB 3.375 g  Status:  Discontinued     3.375 g12.5 mL/hr over 240 Minutes Intravenous 3 times per day 04/13/14 1435 04/14/14 0949   04/13/14 1445  piperacillin-tazobactam (ZOSYN) IVPB 3.375 g     3.375 g100 mL/hr over 30 Minutes Intravenous STAT 04/13/14 1435 04/13/14 1544      DVT Prophylaxis: Prophylactic Heparin   Code Status: Full code  Family Communication Spouse at bedside  Procedures:  Flexible endoscopy on 1/24  CONSULTS:  pulmonary/intensive care, GI and ENT  MEDICATIONS: Scheduled Meds: . ampicillin-sulbactam (UNASYN) IV  1.5 g Intravenous Q12H  . antiseptic oral rinse  7 mL Mouth Rinse q12n4p  . chlorhexidine  15 mL Mouth Rinse BID  . heparin  5,000 Units Subcutaneous 3 times per day  . insulin aspart  0-9 Units Subcutaneous TID WC  . levothyroxine  37.5 mcg Intravenous Daily  . [START ON 04/17/2014] lidocaine  15 mL Mouth/Throat Once  . metoprolol  2.5 mg Intravenous 3 times per day  . pantoprazole (PROTONIX) IV  40 mg Intravenous Q0600   Continuous Infusions: . sodium chloride 50 mL/hr at 04/15/14 2330   PRN Meds:.sodium chloride, acetaminophen (TYLENOL) oral liquid 160 mg/5 mL, acetaminophen, fentaNYL, hydrALAZINE, morphine injection    PHYSICAL EXAM: Vital signs in last 24 hours: Filed Vitals:   04/16/14 0133 04/16/14 0500 04/16/14 1000 04/16/14 1222  BP: 146/76 164/67 173/79 187/76  Pulse: 92 95 91 96  Temp: 98 F (36.7 C) 97.9 F (36.6 C) 97.9  F (36.6 C) 97.3 F (36.3 C)  TempSrc: Oral Oral Oral Oral  Resp: Height:      Weight:      SpO2: 100% 100% 100% 98%    Weight change:  Filed Weights   04/13/14 1555 04/14/14 0500  Weight: 57.7 kg (127 lb 3.3 oz) 58.7 kg (129 lb 6.6 oz)   Body mass index is 28 kg/(m^2).   Gen Exam: Awake and alert with clear speech.  Lying comfortably in  bed. Neck: Supple, No JVD.  No stridor. Chest: B/L Clear.  No rhonchi. CVS: S1 S2 Regular, no murmurs.  Abdomen: soft, BS +, non tender, non distended.  Extremities: no edema, lower extremities warm to touch. Neurologic: Non Focal.   Skin: No Rash.   Wounds: N/A.   Intake/Output from previous day:  Intake/Output Summary (Last 24 hours) at 04/16/14 1502 Last data filed at 04/16/14 0558  Gross per 24 hour  Intake  612.5 ml  Output    100 ml  Net  512.5 ml     LAB RESULTS: CBC  Recent Labs Lab 04/13/14 1222 04/14/14 0244 04/16/14 0408  WBC 12.3* 11.3* 9.1  HGB 11.3* 10.6* 11.2*  HCT 36.4 34.1* 37.3  PLT 334 307 322  MCV 87.9 87.7 89.2  MCH 27.3 27.2 26.8  MCHC 31.0 31.1 30.0  RDW 16.8* 16.9* 17.2*  LYMPHSABS 1.8  --   --   MONOABS 0.6  --   --   EOSABS 1.9*  --   --   BASOSABS 0.0  --   --     Chemistries   Recent Labs Lab 04/13/14 1222 04/14/14 0244 04/16/14 0408  NA 137 143 147*  K 4.6 3.6 4.0  CL 99 104 111  CO2 33* 30 23  GLUCOSE 126* 129* 106*  BUN CREATININE 2.14* 2.18* 2.14*  CALCIUM 9.4 8.9 8.9    CBG:  Recent Labs Lab 04/15/14 1202 04/15/14 1715 04/15/14 2217 04/16/14 0733 04/16/14 1214  GLUCAP 92 105* 112* 88 123*    GFR Estimated Creatinine Clearance: 13.1 mL/min (by C-G formula based on Cr of 2.14).  Coagulation profile No results for input(s): INR, PROTIME in the last 168 hours.  Cardiac Enzymes No results for input(s): CKMB, TROPONINI, MYOGLOBIN in the last 168 hours.  Invalid input(s): CK  Invalid input(s): POCBNP No results for input(s): DDIMER in the last 72 hours. No results for input(s): HGBA1C in the last 72 hours. No results for input(s): CHOL, HDL, LDLCALC, TRIG, CHOLHDL, LDLDIRECT in the last 72 hours. No results for input(s): TSH, T4TOTAL, T3FREE, THYROIDAB in the last 72 hours.  Invalid input(s): FREET3 No results for input(s): VITAMINB12, FOLATE, FERRITIN, TIBC, IRON, RETICCTPCT in the last 72  hours. No results for input(s): LIPASE, AMYLASE in the last 72 hours.  Urine Studies No results for input(s): UHGB, CRYS in the last 72 hours.  Invalid input(s): UACOL, UAPR, USPG, UPH, UTP, UGL, UKET, UBIL, UNIT, UROB, ULEU, UEPI, UWBC, URBC, UBAC, CAST, UCOM, BILUA  MICROBIOLOGY: Recent Results (from the past 240 hour(s))  Rapid strep screen     Status: None   Collection Time: 04/13/14  2:12 PM  Result Value Ref Range Status   Streptococcus, Group A Screen (Direct) NEGATIVE NEGATIVE Final    Comment: (NOTE) A Rapid Antigen test may result negative if the antigen level in the sample is below the detection level of this test. The FDA has not cleared this test as  a stand-alone test therefore the rapid antigen negative result has reflexed to a Group A Strep culture.   Culture, Group A Strep     Status: None   Collection Time: 04/13/14  2:12 PM  Result Value Ref Range Status   Specimen Description THROAT  Final   Special Requests NONE  Final   Culture   Final    No Beta Hemolytic Streptococci Isolated Performed at [facility]    Report Status 04/15/2014 FINAL  Final  Culture, blood (routine x 2)     Status: None (Preliminary result)   Collection Time: 04/13/14  2:30 PM  Result Value Ref Range Status   Specimen Description BLOOD LEFT ARM  Final   Special Requests BOTTLES DRAWN AEROBIC AND ANAEROBIC 5 CC  Final   Culture   Final           BLOOD CULTURE RECEIVED NO GROWTH TO DATE CULTURE WILL BE HELD FOR 5 DAYS BEFORE ISSUING A FINAL NEGATIVE REPORT Performed at [facility]    Report Status PENDING  Incomplete  Culture, blood (routine x 2)     Status: None (Preliminary result)   Collection Time: 04/13/14  3:04 PM  Result Value Ref Range Status   Specimen Description BLOOD LEFT ANTECUBITAL  Final   Special Requests BOTTLES DRAWN AEROBIC ONLY 10CC  Final   Culture   Final           BLOOD CULTURE RECEIVED NO GROWTH TO DATE CULTURE WILL BE HELD FOR 5 DAYS BEFORE  ISSUING A FINAL NEGATIVE REPORT Performed at [facility]    Report Status PENDING  Incomplete  MRSA PCR Screening     Status: None   Collection Time: 04/13/14  4:13 PM  Result Value Ref Range Status   MRSA by PCR NEGATIVE NEGATIVE Final    Comment:        The GeneXpert MRSA Assay (FDA approved for NASAL specimens only), is one component of a comprehensive MRSA colonization surveillance program. It is not intended to diagnose MRSA infection nor to guide or monitor treatment for MRSA infections.     RADIOLOGY STUDIES/RESULTS: Ct Soft Tissue Neck Wo Contrast  04/13/2014   CLINICAL DATA:  Drooling with difficulty swallowing. Renal insufficiency precludes IV contrast administration. Initial encounter.  EXAM: CT NECK WITHOUT CONTRAST  TECHNIQUE: Multidetector CT imaging of the neck was performed following the standard protocol without intravenous contrast.  COMPARISON:  Limited correlation is made with a head CT 06/15/2013.  FINDINGS: The oropharyngeal airway is patent. The nasopharyngeal airway is closed without gross thickening of the uvula. There is no evidence of thickening of the epiglottis or tongue base. The larynx appears normal. No focal inflammatory changes or mucosal lesions are identified on non contrast imaging. There is possible mild symmetric prominence of the palatine and lingual tonsils. The proximal esophagus appears grossly unremarkable.  The salivary glands appear unremarkable. The thyroid gland is atrophied. Mild carotid atherosclerosis and tortuosity are present bilaterally. There is also atherosclerosis of the aorta and great vessels.  There is no evidence of cervical lymphadenopathy or neck mass. Intracranial atrophy is grossly stable. The visualized paranasal sinuses are clear.  Mild linear scarring is present in the right lung. There is no confluent airspace opacity or suspicious pulmonary nodule. Extensive degenerative changes are present throughout the cervical  spine associated with a gradual kyphosis. There is probable ankylosis across the C3-4 disc space. TMJ degenerative changes are present, worse on the left.  IMPRESSION: 1.  No specific explanation for the patient's symptoms demonstrated on non contrast imaging. The nasopharyngeal airway is close without obvious abnormality of the uvula. 2. Possible mild thickening of the palatine and lingual tonsils without focal mucosal lesion. ENT evaluation may be warranted. 3. No acute inflammatory changes or enlarged lymph nodes seen. 4. Diffuse cervical spondylosis.   Electronically Signed   By: Roxy Horseman M.D.   On: 04/13/2014 12:34   Mm Digital Diagnostic Bilat  03/31/2014   CLINICAL DATA:  Right breast edema and redness. The patient has had 1 course of antibiotics and states the redness is decreased.  EXAM: DIGITAL DIAGNOSTIC  BILATERAL MAMMOGRAM WITH CAD  ULTRASOUND RIGHT BREAST  COMPARISON:  Jul 24, 2002  ACR Breast Density Category b: There are scattered areas of fibroglandular density.  FINDINGS: Cc and MLO views of bilateral breasts are submitted. There is generalized marked skin thickness of the right breast versus the left. There is generalized increase right breast parenchymal markings compared to the left.  Mammographic images were processed with CAD.  Ultrasound is performed. The ultrasound is limited perform with the patient in the wheelchair as patient is nonambulatory from the wheelchair. There is no definite focal discrete cystic or solid lesion in the retroareolar right breast superior there is generalized edema in the retroareolar right breast.  IMPRESSION: Suspicious findings.  RECOMMENDATION: Surgical consultation for skin punch biopsy to exclude inflammatory breast cancer.  I have discussed the findings and recommendations with the patient. Results were also provided in writing at the conclusion of the visit. If applicable, a reminder letter will be sent to the patient regarding the next appointment.   BI-RADS CATEGORY  4: Suspicious.   Electronically Signed   By: Sherian Rein M.D.   On: 03/31/2014 14:36   US Breast Ltd Uni Right Inc Axilla  03/31/2014   CLINICAL DATA:  Right breast edema and redness. The patient has had 1 course of antibiotics and states the redness is decreased.  EXAM: DIGITAL DIAGNOSTIC  BILATERAL MAMMOGRAM WITH CAD  ULTRASOUND RIGHT BREAST  COMPARISON:  Jul 24, 2002  ACR Breast Density Category b: There are scattered areas of fibroglandular density.  FINDINGS: Cc and MLO views of bilateral breasts are submitted. There is generalized marked skin thickness of the right breast versus the left. There is generalized increase right breast parenchymal markings compared to the left.  Mammographic images were processed with CAD.  Ultrasound is performed. The ultrasound is limited perform with the patient in the wheelchair as patient is nonambulatory from the wheelchair. There is no definite focal discrete cystic or solid lesion in the retroareolar right breast superior there is generalized edema in the retroareolar right breast.  IMPRESSION: Suspicious findings.  RECOMMENDATION: Surgical consultation for skin punch biopsy to exclude inflammatory breast cancer.  I have discussed the findings and recommendations with the patient. Results were also provided in writing at the conclusion of the visit. If applicable, a reminder letter will be sent to the patient regarding the next appointment.  BI-RADS CATEGORY  4: Suspicious.   Electronically Signed   By: Sherian Rein M.D.   On: 03/31/2014 14:36    Jeoffrey Massed, MD  [facility] Pager:[phone]  If 7PM-7AM, please contact night-coverage [email or web address] Password [email or web address] 04/16/2014, 3:02 PM   LOS: 3 days

## 2014-04-16 NOTE — Progress Notes (Signed)
SLP Cancellation Note  Patient Details Name: [name] MRN: [id number] DOB: 1923-06-01   Cancelled treatment:       Reason Eval/Treat Not Completed: Spoke with Dr. Raye Sorrow office - will defer assessment for now.  Pt for repeat laryngoscopy next date; will await results/direction.    [name] 04/16/2014, 11:13 AM

## 2014-04-17 DIAGNOSIS — N058 Unspecified nephritic syndrome with other morphologic changes: Secondary | ICD-10-CM

## 2014-04-17 LAB — GLUCOSE, CAPILLARY
Glucose-Capillary: 106 mg/dL — ABNORMAL HIGH (ref 70–99)
Glucose-Capillary: 103 mg/dL — ABNORMAL HIGH (ref 70–99)

## 2014-04-17 LAB — BASIC METABOLIC PANEL
Anion gap: 10 (ref 5–15)
BUN: 22 mg/dL (ref 6–23)
CALCIUM: 8.6 mg/dL (ref 8.4–10.5)
CO2: 25 mmol/L (ref 19–32)
CREATININE: 1.85 mg/dL — AB (ref 0.50–1.10)
Chloride: 111 mmol/L (ref 96–112)
GFR calc non Af Amer: 23 mL/min — ABNORMAL LOW (ref >90)
GFR, EST AFRICAN AMERICAN: 27 mL/min — AB (ref >90)
Glucose, Bld: 152 mg/dL — ABNORMAL HIGH (ref 70–99)
POTASSIUM: 4.2 mmol/L (ref 3.5–5.1)
Sodium: 146 mmol/L — ABNORMAL HIGH (ref 135–145)

## 2014-04-17 MED ORDER — CALCITRIOL 0.25 MCG PO CAPS
0.2500 ug | ORAL_CAPSULE | ORAL | Status: DC
  Administered 2014-04-17: 0.25 ug via ORAL
  Filled 2014-04-17 (×2): qty 1

## 2014-04-17 MED ORDER — ASPIRIN EC 81 MG PO TBEC
81.0000 mg | DELAYED_RELEASE_TABLET | Freq: Every day | ORAL | Status: DC
  Administered 2014-04-17: 81 mg via ORAL
  Filled 2014-04-17: qty 1

## 2014-04-17 MED ORDER — CARVEDILOL 6.25 MG PO TABS: 6.2500 mg | ORAL_TABLET | Freq: Two times a day (BID) | ORAL | Status: DC

## 2014-04-17 MED ORDER — GABAPENTIN 300 MG PO CAPS
300.0000 mg | ORAL_CAPSULE | Freq: Three times a day (TID) | ORAL | Status: DC
  Administered 2014-04-17 (×2): 300 mg via ORAL
  Filled 2014-04-17 (×2): qty 1

## 2014-04-17 MED ORDER — GLUCERNA SHAKE PO LIQD: 237.0000 mL | Freq: Every day | ORAL | Status: DC | PRN

## 2014-04-17 MED ORDER — ISOSORBIDE MONONITRATE ER 60 MG PO TB24
60.0000 mg | ORAL_TABLET | Freq: Every day | ORAL | Status: DC
  Administered 2014-04-17: 60 mg via ORAL
  Filled 2014-04-17: qty 1

## 2014-04-17 MED ORDER — DOCUSATE SODIUM 100 MG PO CAPS: 100.0000 mg | ORAL_CAPSULE | Freq: Every day | ORAL | Status: DC | PRN

## 2014-04-17 MED ORDER — FUROSEMIDE 80 MG PO TABS
80.0000 mg | ORAL_TABLET | Freq: Two times a day (BID) | ORAL | Status: DC
  Administered 2014-04-17: 80 mg via ORAL
  Filled 2014-04-17: qty 1

## 2014-04-17 MED ORDER — AMLODIPINE BESYLATE 10 MG PO TABS
10.0000 mg | ORAL_TABLET | Freq: Every day | ORAL | Status: DC
  Administered 2014-04-17: 10 mg via ORAL
  Filled 2014-04-17: qty 1

## 2014-04-17 MED ORDER — AMOXICILLIN-POT CLAVULANATE 875-125 MG PO TABS: 1.0000 | ORAL_TABLET | Freq: Two times a day (BID) | ORAL | Status: DC

## 2014-04-17 MED ORDER — LEVOTHYROXINE SODIUM 75 MCG PO TABS
75.0000 ug | ORAL_TABLET | Freq: Every day | ORAL | Status: DC
  Filled 2014-04-17: qty 1

## 2014-04-17 MED ORDER — CLOPIDOGREL BISULFATE 75 MG PO TABS
75.0000 mg | ORAL_TABLET | Freq: Every day | ORAL | Status: DC
  Administered 2014-04-17: 75 mg via ORAL
  Filled 2014-04-17: qty 1

## 2014-04-17 MED ORDER — ALLOPURINOL 100 MG PO TABS
200.0000 mg | ORAL_TABLET | Freq: Every day | ORAL | Status: DC
  Administered 2014-04-17: 200 mg via ORAL
  Filled 2014-04-17: qty 2

## 2014-04-17 MED ORDER — PRAVASTATIN SODIUM 40 MG PO TABS
40.0000 mg | ORAL_TABLET | Freq: Every day | ORAL | Status: DC
  Filled 2014-04-17: qty 1

## 2014-04-17 NOTE — Progress Notes (Signed)
SLP Cancellation Note  Patient Details Name: [name] MRN: [id number] DOB: 01-16-1924   Cancelled treatment:       Reason Eval/Treat Not Completed: Contacted Dr. Raye Sorrow office for f/u - he does not believe that swallow evaluation is warranted after reassessment of patient this morning. Information was passed along to attending MD, Dr. Jerral Ralph. Will sign off at this time - please re-order SLP as needed.   [name], M.A. CCC-SLP [phone]  [name] 04/17/2014, 11:24 AM

## 2014-04-17 NOTE — Progress Notes (Signed)
Pt with HH orders for PT.  Went to speak with patient about these arrangements. She politely declined, stating that she already had DME at home and that she didn't want HHPT.  She was agreeable to speaking with her PCP if she changed her mind after discharge. Assigned RN and attending MD both notified of this decision.  Medicare IM (Important Message) delivered to patient today by me in anticipation of discharge.   Carlyle Lipa, RN BSN MHA CCM  Case Manager, Trauma Service/Unit [facility] [phone]

## 2014-04-17 NOTE — Progress Notes (Signed)
Discharge home. Home discharge instruction with prescription given to daughter. Patient alert and oriented, not in any distress. No questions verbalized prior to discharge.

## 2014-04-17 NOTE — Discharge Instructions (Signed)
Stay on a soft diet for 1 week, before moving to a regular diet.

## 2014-04-17 NOTE — Progress Notes (Signed)
04/17/2014 9:22 AM  [name] [phone]  Post-Op Day 5    Temp:  [97.3 F (36.3 C)-98.2 F (36.8 C)] 98.2 F (36.8 C) (01/28 0600) Pulse Rate:  [81-115] 115 (01/28 0600) Resp:  [16-18] 18 (01/28 0600) BP: (173-192)/(70-80) 189/70 mmHg (01/28 0600) SpO2:  [98 %-100 %] 100 % (01/28 0600),     Intake/Output Summary (Last 24 hours) at 04/17/14 8185 Last data filed at 04/17/14 0549  Gross per 24 hour  Intake 600.67 ml  Output      0 ml  Net 600.67 ml    Results for orders placed or performed during the hospital encounter of 04/13/14 (from the past 24 hour(s))  Glucose, capillary     Status: Abnormal   Collection Time: 04/16/14 12:14 PM  Result Value Ref Range   Glucose-Capillary 123 (H) 70 - 99 mg/dL  Glucose, capillary     Status: None   Collection Time: 04/16/14  5:27 PM  Result Value Ref Range   Glucose-Capillary 99 70 - 99 mg/dL  Glucose, capillary     Status: Abnormal   Collection Time: 04/16/14  9:49 PM  Result Value Ref Range   Glucose-Capillary 121 (H) 70 - 99 mg/dL  Glucose, capillary     Status: Abnormal   Collection Time: 04/17/14  8:11 AM  Result Value Ref Range   Glucose-Capillary 106 (H) 70 - 99 mg/dL    SUBJECTIVE:  Less throat pain.  Breathing OK  OBJECTIVE:  Voice OK .   Breathing OK.  Per flexible laryngoscope less posterior commissure swelling.  No pooling now.  IMPRESSION:  Satisfactory check.  I still favor this having been early epiglottitis, but a pill erosion is not out of the question  PLAN:  Could go home when eating reasonably well.  Po Augmentin x 5 more days should be plenty.  I will stop by again tomorrow.    Flo Shanks

## 2014-04-17 NOTE — Discharge Summary (Signed)
PATIENT DETAILS Name: [name] Age: 81 y.o. Sex: female Date of Birth: [birth year] MRN: [id number]. Admitting Physician: Lupita Leash, MD WUJ:[id number],OZHYQ, MD  Admit Date: 04/13/2014 Discharge date: 04/17/2014  Recommendations for Outpatient Follow-up:  1. CBC/BMET in 1 week  PRIMARY DISCHARGE DIAGNOSIS:  Active Problems:   Epiglottitis   Neck pain   Food impaction of esophagus      PAST MEDICAL HISTORY: Past Medical History  Diagnosis Date  . Coronary artery disease     a. Cath 09/2010 - med rx.  Marland Kitchen TIA (transient ischemic attack)   . Hypertension   . Shingles   . Stroke   . Anemia   . Cardiomyopathy, idiopathic 02/08/2011  . Myocardial infarct     x 3 unsure of years  . Irregular heartbeat   . Ulcer   . GERD (gastroesophageal reflux disease)   . Hx of transient ischemic attack (TIA)   . Memory loss   . Chronic diastolic CHF (congestive heart failure)     Takes Lasix  . Gout     takes allopurinol  . Peripheral vascular disease     a. s/p L BKA.  . Diabetes mellitus     type 2 NIDDM  . Chronic kidney disease (CKD), stage IV (severe)   . Arthritis   . Hypothyroidism     (SEVERE) Takes Levothryroxine  . Nonischemic cardiomyopathy     EF now is 55%, reduced due to myxedema, which is improved.  . Dyslipidemia   . Peripheral neuropathy   . H/O echocardiogram 09/06/11    Indication- nonIschemic Cardiomyopathy. EF = now greater than 55% with no regional wall motion abnormalities. Tthere is mild to moderate trisuspid regurgitayion and mild pulmonary hypertension with an RVSP of 35 mmHg as well as stage 1 diastolic dysfunction and mild to moderate LVH.  Marland Kitchen Abnormal nuclear stress test 06/01/09    Demonstrated a new area of infarct scar, peri-infarct ischemia seen in the inferolateral territory. EF eas normal at 70% with mild hypocontractility at the apex, distal inferolateral wall.    DISCHARGE MEDICATIONS: Current Discharge Medication List    START  taking these medications   Details  amoxicillin-clavulanate (AUGMENTIN) 875-125 MG per tablet Take 1 tablet by mouth 2 (two) times daily. Qty: 10 tablet, Refills: 0      CONTINUE these medications which have NOT CHANGED   Details  acetaminophen (TYLENOL) 325 MG tablet Take 650 mg by mouth every 6 (six) hours as needed for mild pain.    allopurinol (ZYLOPRIM) 100 MG tablet Take 200 mg by mouth daily.     amLODipine (NORVASC) 10 MG tablet Take 10 mg by mouth daily.    aspirin EC 81 MG tablet Take 81 mg by mouth daily.    calcitRIOL (ROCALTROL) 0.25 MCG capsule Take 0.25 mcg by mouth every other day.     carvedilol (COREG) 6.25 MG tablet Take 6.25 mg by mouth 2 (two) times daily with a meal.    clopidogrel (PLAVIX) 75 MG tablet Take 75 mg by mouth daily.    docusate sodium (COLACE) 100 MG capsule Take 100 mg by mouth daily as needed for mild constipation.    gabapentin (NEURONTIN) 300 MG capsule Take 300 mg by mouth 3 (three) times daily.    insulin glargine (LANTUS) 100 UNIT/ML injection Inject 0.22 mLs (22 Units total) into the skin daily. Qty: 10 mL, Refills: 11    isosorbide mononitrate (IMDUR) 60 MG 24 hr tablet Take 60 mg by mouth  daily.    levothyroxine (SYNTHROID, LEVOTHROID) 75 MCG tablet Take 75 mcg by mouth daily before breakfast.    multivitamin (RENA-VIT) TABS tablet Take 1 tablet by mouth at bedtime. Refills: 0    nitroGLYCERIN (NITROSTAT) 0.4 MG SL tablet Place 0.4 mg under the tongue every 5 (five) minutes as needed for chest pain.     ONGLYZA 5 MG TABS tablet Take 5 mg by mouth daily.     pravastatin (PRAVACHOL) 40 MG tablet Take 40 mg by mouth daily.    RENVELA 800 MG tablet Take 800 mg by mouth 3 (three) times daily with meals.     travoprost, benzalkonium, (TRAVATAN) 0.004 % ophthalmic solution Place 1 drop into both eyes at bedtime.     feeding supplement, GLUCERNA SHAKE, (GLUCERNA SHAKE) LIQD Take 237 mLs by mouth daily as needed (PLease offer to pt if  meal intake is less than 50%). Refills: 0    furosemide (LASIX) 80 MG tablet TAKE 1 TABLET (80 MG TOTAL) BY MOUTH 2 (TWO) TIMES DAILY. Qty: 60 tablet, Refills: 2    ipratropium-albuterol (DUONEB) 0.5-2.5 (3) MG/3ML SOLN Take 3 mLs by nebulization every 6 (six) hours as needed (for shortness of breath).    oxyCODONE-acetaminophen (ROXICET) 5-325 MG per tablet Take 1-2 tablets by mouth every 4 (four) hours as needed for severe pain. Qty: 20 tablet, Refills: 0      STOP taking these medications     HYDROcodone-acetaminophen (NORCO/VICODIN) 5-325 MG per tablet         ALLERGIES:   Allergies  Allergen Reactions  . Aspirin Nausea And Vomiting    325 mg (adult strength) [name]     BRIEF HPI:  See H&P, Labs, Consult and Test reports for all details in brief, patient is a 82yo woman w/ multiple comorbidities who presented with 2 day hx of sore throat, dysphagia, and odynophagia with drooling and difficulty breathing   CONSULTATIONS:   pulmonary/intensive care and ENT  PERTINENT RADIOLOGIC STUDIES: Ct Soft Tissue Neck Wo Contrast  04/13/2014   CLINICAL DATA:  Drooling with difficulty swallowing. Renal insufficiency precludes IV contrast administration. Initial encounter.  EXAM: CT NECK WITHOUT CONTRAST  TECHNIQUE: Multidetector CT imaging of the neck was performed following the standard protocol without intravenous contrast.  COMPARISON:  Limited correlation is made with a head CT 06/15/2013.  FINDINGS: The oropharyngeal airway is patent. The nasopharyngeal airway is closed without gross thickening of the uvula. There is no evidence of thickening of the epiglottis or tongue base. The larynx appears normal. No focal inflammatory changes or mucosal lesions are identified on non contrast imaging. There is possible mild symmetric prominence of the palatine and lingual tonsils. The proximal esophagus appears grossly unremarkable.  The  salivary glands appear unremarkable. The thyroid gland is atrophied. Mild carotid atherosclerosis and tortuosity are present bilaterally. There is also atherosclerosis of the aorta and great vessels.  There is no evidence of cervical lymphadenopathy or neck mass. Intracranial atrophy is grossly stable. The visualized paranasal sinuses are clear.  Mild linear scarring is present in the right lung. There is no confluent airspace opacity or suspicious pulmonary nodule. Extensive degenerative changes are present throughout the cervical spine associated with a gradual kyphosis. There is probable ankylosis across the C3-4 disc space. TMJ degenerative changes are present, worse on the left.  IMPRESSION: 1. No specific explanation for the patient's symptoms demonstrated on non contrast imaging. The nasopharyngeal airway is close  without obvious abnormality of the uvula. 2. Possible mild thickening of the palatine and lingual tonsils without focal mucosal lesion. ENT evaluation may be warranted. 3. No acute inflammatory changes or enlarged lymph nodes seen. 4. Diffuse cervical spondylosis.   Electronically Signed   By: Roxy Horseman M.D.   On: 04/13/2014 12:34   Mm Digital Diagnostic Bilat  03/31/2014   CLINICAL DATA:  Right breast edema and redness. The patient has had 1 course of antibiotics and states the redness is decreased.  EXAM: DIGITAL DIAGNOSTIC  BILATERAL MAMMOGRAM WITH CAD  ULTRASOUND RIGHT BREAST  COMPARISON:  Jul 24, 2002  ACR Breast Density Category b: There are scattered areas of fibroglandular density.  FINDINGS: Cc and MLO views of bilateral breasts are submitted. There is generalized marked skin thickness of the right breast versus the left. There is generalized increase right breast parenchymal markings compared to the left.  Mammographic images were processed with CAD.  Ultrasound is performed. The ultrasound is limited perform with the patient in the wheelchair as patient is nonambulatory from the  wheelchair. There is no definite focal discrete cystic or solid lesion in the retroareolar right breast superior there is generalized edema in the retroareolar right breast.  IMPRESSION: Suspicious findings.  RECOMMENDATION: Surgical consultation for skin punch biopsy to exclude inflammatory breast cancer.  I have discussed the findings and recommendations with the patient. Results were also provided in writing at the conclusion of the visit. If applicable, a reminder letter will be sent to the patient regarding the next appointment.  BI-RADS CATEGORY  4: Suspicious.   Electronically Signed   By: Sherian Rein M.D.   On: 03/31/2014 14:36   US Breast Ltd Uni Right Inc Axilla  03/31/2014   CLINICAL DATA:  Right breast edema and redness. The patient has had 1 course of antibiotics and states the redness is decreased.  EXAM: DIGITAL DIAGNOSTIC  BILATERAL MAMMOGRAM WITH CAD  ULTRASOUND RIGHT BREAST  COMPARISON:  Jul 24, 2002  ACR Breast Density Category b: There are scattered areas of fibroglandular density.  FINDINGS: Cc and MLO views of bilateral breasts are submitted. There is generalized marked skin thickness of the right breast versus the left. There is generalized increase right breast parenchymal markings compared to the left.  Mammographic images were processed with CAD.  Ultrasound is performed. The ultrasound is limited perform with the patient in the wheelchair as patient is nonambulatory from the wheelchair. There is no definite focal discrete cystic or solid lesion in the retroareolar right breast superior there is generalized edema in the retroareolar right breast.  IMPRESSION: Suspicious findings.  RECOMMENDATION: Surgical consultation for skin punch biopsy to exclude inflammatory breast cancer.  I have discussed the findings and recommendations with the patient. Results were also provided in writing at the conclusion of the visit. If applicable, a reminder letter will be sent to the patient regarding  the next appointment.  BI-RADS CATEGORY  4: Suspicious.   Electronically Signed   By: Sherian Rein M.D.   On: 03/31/2014 14:36     PERTINENT LAB RESULTS: CBC:  Recent Labs  04/16/14 0408  WBC 9.1  HGB 11.2*  HCT 37.3  PLT 322   CMET CMP     Component Value Date/Time   NA 146* 04/17/2014 1015   K 4.2 04/17/2014 1015   CL 111 04/17/2014 1015   CO2 25 04/17/2014 1015   GLUCOSE 152* 04/17/2014 1015   BUN 22 04/17/2014 1015   CREATININE 1.85* 04/17/2014  1015   CREATININE 2.51* 06/10/2013 1645   CALCIUM 8.6 04/17/2014 1015   CALCIUM 9.6 10/11/2010 1605   PROT 6.3 08/19/2013 0451   ALBUMIN 2.5* 08/19/2013 0451   AST 19 08/19/2013 0451   ALT 14 08/19/2013 0451   ALKPHOS 76 08/19/2013 0451   BILITOT 0.3 08/19/2013 0451   GFRNONAA 23* 04/17/2014 1015   GFRAA 27* 04/17/2014 1015    GFR Estimated Creatinine Clearance: 15.2 mL/min (by C-G formula based on Cr of 1.85). No results for input(s): LIPASE, AMYLASE in the last 72 hours. No results for input(s): CKTOTAL, CKMB, CKMBINDEX, TROPONINI in the last 72 hours. Invalid input(s): POCBNP No results for input(s): DDIMER in the last 72 hours. No results for input(s): HGBA1C in the last 72 hours. No results for input(s): CHOL, HDL, LDLCALC, TRIG, CHOLHDL, LDLDIRECT in the last 72 hours. No results for input(s): TSH, T4TOTAL, T3FREE, THYROIDAB in the last 72 hours.  Invalid input(s): FREET3 No results for input(s): VITAMINB12, FOLATE, FERRITIN, TIBC, IRON, RETICCTPCT in the last 72 hours. Coags: No results for input(s): INR in the last 72 hours.  Invalid input(s): PT Microbiology: Recent Results (from the past 240 hour(s))  Rapid strep screen     Status: None   Collection Time: 04/13/14  2:12 PM  Result Value Ref Range Status   Streptococcus, Group A Screen (Direct) NEGATIVE NEGATIVE Final    Comment: (NOTE) A Rapid Antigen test may result negative if the antigen level in the sample is below the detection level of this  test. The FDA has not cleared this test as a stand-alone test therefore the rapid antigen negative result has reflexed to a Group A Strep culture.   Culture, Group A Strep     Status: None   Collection Time: 04/13/14  2:12 PM  Result Value Ref Range Status   Specimen Description THROAT  Final   Special Requests NONE  Final   Culture   Final    No Beta Hemolytic Streptococci Isolated Performed at [facility]    Report Status 04/15/2014 FINAL  Final  Culture, blood (routine x 2)     Status: None (Preliminary result)   Collection Time: 04/13/14  2:30 PM  Result Value Ref Range Status   Specimen Description BLOOD LEFT ARM  Final   Special Requests BOTTLES DRAWN AEROBIC AND ANAEROBIC 5 CC  Final   Culture   Final           BLOOD CULTURE RECEIVED NO GROWTH TO DATE CULTURE WILL BE HELD FOR 5 DAYS BEFORE ISSUING A FINAL NEGATIVE REPORT Performed at [facility]    Report Status PENDING  Incomplete  Culture, blood (routine x 2)     Status: None (Preliminary result)   Collection Time: 04/13/14  3:04 PM  Result Value Ref Range Status   Specimen Description BLOOD LEFT ANTECUBITAL  Final   Special Requests BOTTLES DRAWN AEROBIC ONLY 10CC  Final   Culture   Final           BLOOD CULTURE RECEIVED NO GROWTH TO DATE CULTURE WILL BE HELD FOR 5 DAYS BEFORE ISSUING A FINAL NEGATIVE REPORT Performed at [facility]    Report Status PENDING  Incomplete  MRSA PCR Screening     Status: None   Collection Time: 04/13/14  4:13 PM  Result Value Ref Range Status   MRSA by PCR NEGATIVE NEGATIVE Final    Comment:        The GeneXpert MRSA Assay (FDA  approved for NASAL specimens only), is one component of a comprehensive MRSA colonization surveillance program. It is not intended to diagnose MRSA infection nor to guide or monitor treatment for MRSA infections.      BRIEF HOSPITAL COURSE:  Odynophagia/dysphagia:?Secondary to epiglottitis vs pill erosin (although EGD  negative). No abnormality seen on endoscopy. Flexible laryngoscopy done by ENT on admission showed swelling of the aryepiglottic folds and post cricoid mucosa.Started on IV Unasyn, improved, diet slowly advanced. Spoke with Dr Lazarus Salines this am, ok to discharge when tolerating diet, she has tolerated soft diet for lunch and wants to go home.Will transition to Augmentin on discharge. I have asked her to stay on a soft diet for one more week.   Suspected early epiglottitis: Maintained on Unasyn-will transition to augmentin on discharge.Blood cultures negative, strep throat negative, urine negative for Legionella and streptococcal antigen. No hoarseness or stridor on exam.Dysphagia seems to have resolved.   Chronic diastolic heart failure:Clinically compensated   Diabetes: CBGs stable with SSI, Lantus and other oral hypoglycemics will be resumed on discharge.   Stage IV chronic kidney disease: Creatinine close to usual baseline. Has a fistula in place.   History of peripheral vascular disease status post left BKA.   History of CAD: Moderate by cardiac catheterization in 2013. Antiplatelets will be resumed on discharge   Hypothyroidism: resume oral synthroid   Hypertension: resume oral medications-stop IV Lopressor   GERD: Continue with PPI   History of known right breast mass: Per patient and family, biopsy scheduled in February   TODAY-DAY OF DISCHARGE:  Subjective:   [name] today has no headache,no chest abdominal pain,no new weakness tingling or numbness, feels much better wants to go home today.   Objective:   Blood pressure 182/96, pulse 85, temperature 98.6 F (37 C), temperature source Oral, resp. rate 16, height  (1.448 m), weight 58.7 kg (129 lb 6.6 oz), SpO2 100 %.  Intake/Output Summary (Last 24 hours) at 04/17/14 1612 Last data filed at 04/17/14 1245  Gross per 24 hour  Intake 840.67 ml  Output      0 ml  Net 840.67 ml   Filed Weights    04/13/14 1555 04/14/14 0500  Weight: 57.7 kg (127 lb 3.3 oz) 58.7 kg (129 lb 6.6 oz)    Exam Awake Alert, Oriented *3, No new F.N deficits, Normal affect [place].AT,PERRAL Supple Neck,No JVD, No cervical lymphadenopathy appriciated.  Symmetrical Chest wall movement, Good air movement bilaterally, CTAB RRR,No Gallops,Rubs or new Murmurs, No Parasternal Heave +ve B.Sounds, Abd Soft, Non tender, No organomegaly appriciated, No rebound -guarding or rigidity. No Cyanosis, Clubbing or edema, No new Rash or bruise  DISCHARGE CONDITION: Stable  DISPOSITION: Home-refused HHPT  DISCHARGE INSTRUCTIONS:    Activity:  As tolerated with Full fall precautions use walker/cane & assistance as needed  Diet recommendation: Diabetic Diet Heart Healthy diet  Discharge Instructions    Call MD for:    Complete by:  As directed   If recurrent difficulty with swallowing or painful swallowing     Diet - low sodium heart healthy    Complete by:  As directed      Diet Carb Modified    Complete by:  As directed      Increase activity slowly    Complete by:  As directed            Follow-up Information    Follow up with [facility], [place]. Schedule an appointment as soon as possible for a visit  in 1 week.   Specialty:  Internal Medicine   Contact information:   [address] [place] [address] [place] [address] [phone]       Follow up with Flo Shanks, MD. Schedule an appointment as soon as possible for a visit in 1 week.   Specialty:  Otolaryngology   Contact information:   [address] [place] [address] [phone]       Total Time spent on discharge equals 45 minutes.  SignedJeoffrey Massed 04/17/2014 4:12 PM

## 2014-04-17 NOTE — Progress Notes (Signed)
PATIENT DETAILS Name: [name] Age: 81 y.o. Sex: female Date of Birth: [birth year] Admit Date: 04/13/2014 Admitting Physician Lupita Leash, MD WUJ:[id number], MD  Subjective: Tolerated soft diet for lunch-wants to go home. Feels a lot better  Assessment/Plan: Active Problems:   Odynophagia/dysphagia:?Secondary to epiglottitis vs pill erosin (although EGD negative). No abnormality seen on endoscopy. Flexible laryngoscopy done by ENT on admission showed swelling of the aryepiglottic folds and post cricoid mucosa.Started on IV  Unasyn, improved, diet slowly advanced. Spoke with Dr Lazarus Salines this am, ok to discharge when tolerating diet, she has tolerated soft diet for lunch and wants to go home.Will transition to Augmentin on discharge    Suspected early epiglottitis: Maintained on Unasyn-will transition to augmentin on discharge.Blood cultures negative, strep throat negative, urine negative for Legionella and streptococcal antigen. No hoarseness or stridor on exam.Dysphagia seems to have resolved.    Chronic diastolic heart failure:Clinically compensated    Diabetes: CBGs stable with SSI, Lantus will be resumed on discharge.    Stage IV chronic kidney disease: Creatinine close to usual baseline. Has a fistula in place.    History of peripheral vascular disease  status post left BKA.    History of CAD: Moderate by cardiac catheterization in 2013. Antiplatelets will be resumed on discharge    Hypothyroidism: resume oral synthroid    Hypertension: resume oral medications-stop IV Lopressor     GERD: Continue with PPI     History of known right breast mass: Per patient and family, biopsy scheduled in February  Disposition: Remain inpatient-home later today  Antibiotics:  See below.   Anti-infectives    Start     Dose/Rate Route Frequency Ordered Stop   04/17/14 0000  amoxicillin-clavulanate (AUGMENTIN) 875-125 MG per tablet     1 tablet Oral 2 times  daily 04/17/14 1550     04/14/14 1300  ampicillin-sulbactam (UNASYN) 1.5 g in sodium chloride 0.9 % 50 mL IVPB     1.5 g100 mL/hr over 30 Minutes Intravenous Every 12 hours 04/14/14 0957     04/13/14 2300  piperacillin-tazobactam (ZOSYN) IVPB 3.375 g  Status:  Discontinued     3.375 g12.5 mL/hr over 240 Minutes Intravenous 3 times per day 04/13/14 1435 04/14/14 0949   04/13/14 1445  piperacillin-tazobactam (ZOSYN) IVPB 3.375 g     3.375 g100 mL/hr over 30 Minutes Intravenous STAT 04/13/14 1435 04/13/14 1544      DVT Prophylaxis: Prophylactic Heparin   Code Status: Full code  Family Communication None at bedside  Procedures:  Flexible endoscopy on 1/24  CONSULTS:  pulmonary/intensive care, GI and ENT  MEDICATIONS: Scheduled Meds: . allopurinol  200 mg Oral Daily  . ampicillin-sulbactam (UNASYN) IV  1.5 g Intravenous Q12H  . antiseptic oral rinse  7 mL Mouth Rinse q12n4p  . aspirin EC  81 mg Oral Daily  . calcitRIOL  0.25 mcg Oral QODAY  . carvedilol  6.25 mg Oral BID WC  . chlorhexidine  15 mL Mouth Rinse BID  . clopidogrel  75 mg Oral Daily  . furosemide  80 mg Oral BID  . gabapentin  300 mg Oral TID  . heparin  5,000 Units Subcutaneous 3 times per day  . insulin aspart  0-9 Units Subcutaneous TID WC  . isosorbide mononitrate  60 mg Oral Daily  . [START ON 04/18/2014] levothyroxine  75 mcg Oral QAC breakfast  . lidocaine  15 mL Mouth/Throat Once  . pravastatin  40 mg  Oral q1800   Continuous Infusions:   PRN Meds:.acetaminophen (TYLENOL) oral liquid 160 mg/5 mL, acetaminophen, docusate sodium, feeding supplement (GLUCERNA SHAKE), fentaNYL, hydrALAZINE, morphine injection    PHYSICAL EXAM: Vital signs in last 24 hours: Filed Vitals:   04/16/14 1800 04/16/14 2143 04/17/14 0110 04/17/14 0600  BP: 180/73 192/78 190/80 189/70  Pulse: 96 83 81 115  Temp: 97.5 F (36.4 C) 98.2 F (36.8 C) 98.2 F (36.8 C) 98.2 F (36.8 C)  TempSrc: Oral Oral Oral Oral  Resp: 16  17 16 18   Height:      Weight:      SpO2: 100% 98% 100% 100%    Weight change:  Filed Weights   04/13/14 1555 04/14/14 0500  Weight: 57.7 kg (127 lb 3.3 oz) 58.7 kg (129 lb 6.6 oz)   Body mass index is 28 kg/(m^2).   Gen Exam: Awake and alert with clear speech.  Lying comfortably in bed. Neck: Supple, No JVD.  No stridor. Chest: B/L Clear.  No rhonchi. CVS: S1 S2 Regular, no murmurs.  Abdomen: soft, BS +, non tender, non distended.  Extremities: no edema, lower extremities warm to touch. Neurologic: Non Focal.   Skin: No Rash.   Wounds: N/A.   Intake/Output from previous day:  Intake/Output Summary (Last 24 hours) at 04/17/14 1551 Last data filed at 04/17/14 0549  Gross per 24 hour  Intake 600.67 ml  Output      0 ml  Net 600.67 ml     LAB RESULTS: CBC  Recent Labs Lab 04/13/14 1222 04/14/14 0244 04/16/14 0408  WBC 12.3* 11.3* 9.1  HGB 11.3* 10.6* 11.2*  HCT 36.4 34.1* 37.3  PLT 334 307 322  MCV 87.9 87.7 89.2  MCH 27.3 27.2 26.8  MCHC 31.0 31.1 30.0  RDW 16.8* 16.9* 17.2*  LYMPHSABS 1.8  --   --   MONOABS 0.6  --   --   EOSABS 1.9*  --   --   BASOSABS 0.0  --   --     Chemistries   Recent Labs Lab 04/13/14 1222 04/14/14 0244 04/16/14 0408 04/17/14 1015  NA 137 143 147* 146*  K 4.6 3.6 4.0 4.2  CL 99 104 111 111  CO2 33* 30 23 25   GLUCOSE 126* 129* 106* 152*  BUN 22 19 22 22   CREATININE 2.14* 2.18* 2.14* 1.85*  CALCIUM 9.4 8.9 8.9 8.6    CBG:  Recent Labs Lab 04/16/14 1214 04/16/14 1727 04/16/14 2149 04/17/14 0811 04/17/14 1229  GLUCAP 123* 99 121* 106* 103*    GFR Estimated Creatinine Clearance: 15.2 mL/min (by C-G formula based on Cr of 1.85).  Coagulation profile No results for input(s): INR, PROTIME in the last 168 hours.  Cardiac Enzymes No results for input(s): CKMB, TROPONINI, MYOGLOBIN in the last 168 hours.  Invalid input(s): CK  Invalid input(s): POCBNP No results for input(s): DDIMER in the last 72 hours. No  results for input(s): HGBA1C in the last 72 hours. No results for input(s): CHOL, HDL, LDLCALC, TRIG, CHOLHDL, LDLDIRECT in the last 72 hours. No results for input(s): TSH, T4TOTAL, T3FREE, THYROIDAB in the last 72 hours.  Invalid input(s): FREET3 No results for input(s): VITAMINB12, FOLATE, FERRITIN, TIBC, IRON, RETICCTPCT in the last 72 hours. No results for input(s): LIPASE, AMYLASE in the last 72 hours.  Urine Studies No results for input(s): UHGB, CRYS in the last 72 hours.  Invalid input(s): UACOL, UAPR, USPG, UPH, UTP, UGL, UKET, UBIL, UNIT, UROB, ULEU, UEPI, UWBC,  URBC, UBAC, CAST, UCOM, BILUA  MICROBIOLOGY: Recent Results (from the past 240 hour(s))  Rapid strep screen     Status: None   Collection Time: 04/13/14  2:12 PM  Result Value Ref Range Status   Streptococcus, Group A Screen (Direct) NEGATIVE NEGATIVE Final    Comment: (NOTE) A Rapid Antigen test may result negative if the antigen level in the sample is below the detection level of this test. The FDA has not cleared this test as a stand-alone test therefore the rapid antigen negative result has reflexed to a Group A Strep culture.   Culture, Group A Strep     Status: None   Collection Time: 04/13/14  2:12 PM  Result Value Ref Range Status   Specimen Description THROAT  Final   Special Requests NONE  Final   Culture   Final    No Beta Hemolytic Streptococci Isolated Performed at [facility]    Report Status 04/15/2014 FINAL  Final  Culture, blood (routine x 2)     Status: None (Preliminary result)   Collection Time: 04/13/14  2:30 PM  Result Value Ref Range Status   Specimen Description BLOOD LEFT ARM  Final   Special Requests BOTTLES DRAWN AEROBIC AND ANAEROBIC 5 CC  Final   Culture   Final           BLOOD CULTURE RECEIVED NO GROWTH TO DATE CULTURE WILL BE HELD FOR 5 DAYS BEFORE ISSUING A FINAL NEGATIVE REPORT Performed at [facility]    Report Status PENDING  Incomplete  Culture, blood  (routine x 2)     Status: None (Preliminary result)   Collection Time: 04/13/14  3:04 PM  Result Value Ref Range Status   Specimen Description BLOOD LEFT ANTECUBITAL  Final   Special Requests BOTTLES DRAWN AEROBIC ONLY 10CC  Final   Culture   Final           BLOOD CULTURE RECEIVED NO GROWTH TO DATE CULTURE WILL BE HELD FOR 5 DAYS BEFORE ISSUING A FINAL NEGATIVE REPORT Performed at [facility]    Report Status PENDING  Incomplete  MRSA PCR Screening     Status: None   Collection Time: 04/13/14  4:13 PM  Result Value Ref Range Status   MRSA by PCR NEGATIVE NEGATIVE Final    Comment:        The GeneXpert MRSA Assay (FDA approved for NASAL specimens only), is one component of a comprehensive MRSA colonization surveillance program. It is not intended to diagnose MRSA infection nor to guide or monitor treatment for MRSA infections.     RADIOLOGY STUDIES/RESULTS: Ct Soft Tissue Neck Wo Contrast  04/13/2014   CLINICAL DATA:  Drooling with difficulty swallowing. Renal insufficiency precludes IV contrast administration. Initial encounter.  EXAM: CT NECK WITHOUT CONTRAST  TECHNIQUE: Multidetector CT imaging of the neck was performed following the standard protocol without intravenous contrast.  COMPARISON:  Limited correlation is made with a head CT 06/15/2013.  FINDINGS: The oropharyngeal airway is patent. The nasopharyngeal airway is closed without gross thickening of the uvula. There is no evidence of thickening of the epiglottis or tongue base. The larynx appears normal. No focal inflammatory changes or mucosal lesions are identified on non contrast imaging. There is possible mild symmetric prominence of the palatine and lingual tonsils. The proximal esophagus appears grossly unremarkable.  The salivary glands appear unremarkable. The thyroid gland is atrophied. Mild carotid atherosclerosis and tortuosity are present bilaterally. There is also atherosclerosis of  the aorta and great  vessels.  There is no evidence of cervical lymphadenopathy or neck mass. Intracranial atrophy is grossly stable. The visualized paranasal sinuses are clear.  Mild linear scarring is present in the right lung. There is no confluent airspace opacity or suspicious pulmonary nodule. Extensive degenerative changes are present throughout the cervical spine associated with a gradual kyphosis. There is probable ankylosis across the C3-4 disc space. TMJ degenerative changes are present, worse on the left.  IMPRESSION: 1. No specific explanation for the patient's symptoms demonstrated on non contrast imaging. The nasopharyngeal airway is close without obvious abnormality of the uvula. 2. Possible mild thickening of the palatine and lingual tonsils without focal mucosal lesion. ENT evaluation may be warranted. 3. No acute inflammatory changes or enlarged lymph nodes seen. 4. Diffuse cervical spondylosis.   Electronically Signed   By: Roxy Horseman M.D.   On: 04/13/2014 12:34   Mm Digital Diagnostic Bilat  03/31/2014   CLINICAL DATA:  Right breast edema and redness. The patient has had 1 course of antibiotics and states the redness is decreased.  EXAM: DIGITAL DIAGNOSTIC  BILATERAL MAMMOGRAM WITH CAD  ULTRASOUND RIGHT BREAST  COMPARISON:  Jul 24, 2002  ACR Breast Density Category b: There are scattered areas of fibroglandular density.  FINDINGS: Cc and MLO views of bilateral breasts are submitted. There is generalized marked skin thickness of the right breast versus the left. There is generalized increase right breast parenchymal markings compared to the left.  Mammographic images were processed with CAD.  Ultrasound is performed. The ultrasound is limited perform with the patient in the wheelchair as patient is nonambulatory from the wheelchair. There is no definite focal discrete cystic or solid lesion in the retroareolar right breast superior there is generalized edema in the retroareolar right breast.  IMPRESSION:  Suspicious findings.  RECOMMENDATION: Surgical consultation for skin punch biopsy to exclude inflammatory breast cancer.  I have discussed the findings and recommendations with the patient. Results were also provided in writing at the conclusion of the visit. If applicable, a reminder letter will be sent to the patient regarding the next appointment.  BI-RADS CATEGORY  4: Suspicious.   Electronically Signed   By: Sherian Rein M.D.   On: 03/31/2014 14:36   US Breast Ltd Uni Right Inc Axilla  03/31/2014   CLINICAL DATA:  Right breast edema and redness. The patient has had 1 course of antibiotics and states the redness is decreased.  EXAM: DIGITAL DIAGNOSTIC  BILATERAL MAMMOGRAM WITH CAD  ULTRASOUND RIGHT BREAST  COMPARISON:  Jul 24, 2002  ACR Breast Density Category b: There are scattered areas of fibroglandular density.  FINDINGS: Cc and MLO views of bilateral breasts are submitted. There is generalized marked skin thickness of the right breast versus the left. There is generalized increase right breast parenchymal markings compared to the left.  Mammographic images were processed with CAD.  Ultrasound is performed. The ultrasound is limited perform with the patient in the wheelchair as patient is nonambulatory from the wheelchair. There is no definite focal discrete cystic or solid lesion in the retroareolar right breast superior there is generalized edema in the retroareolar right breast.  IMPRESSION: Suspicious findings.  RECOMMENDATION: Surgical consultation for skin punch biopsy to exclude inflammatory breast cancer.  I have discussed the findings and recommendations with the patient. Results were also provided in writing at the conclusion of the visit. If applicable, a reminder letter will be sent to the patient regarding the next appointment.  BI-RADS CATEGORY  4: Suspicious.   Electronically Signed   By: Sherian Rein M.D.   On: 03/31/2014 14:36    Jeoffrey Massed, MD  [facility] Pager:[phone]  [phone]  If 7PM-7AM, please contact night-coverage [email or web address] Password [email or web address] 04/17/2014, 3:51 PM   LOS: 4 days

## 2014-04-17 NOTE — Progress Notes (Signed)
ANTIBIOTIC CONSULT NOTE - FOLLOW UP  Pharmacy Consult for Unasyn Indication: Possible epiglottitis  Allergies  Allergen Reactions  . Aspirin Nausea And Vomiting    325 mg (adult strength) [name]     Patient Measurements: Height: 4\' 9"  (144.8 cm) Weight:  (Bed not working patient cannot stand) IBW/kg (Calculated) : 38.6  Vital Signs: Temp: 98.2 F (36.8 C) (01/28 0600) Temp Source: Oral (01/28 0600) BP: 189/70 mmHg (01/28 0600) Pulse Rate: 115 (01/28 0600) Intake/Output from previous day: 01/27 0701 - 01/28 0700 In: 600.7 [P.O.:120; I.V.:480.7] Out: -  Intake/Output from this shift:    Labs:  Recent Labs  04/16/14 0408  WBC 9.1  HGB 11.2*  PLT 322  CREATININE 2.14*   Estimated Creatinine Clearance: 13.1 mL/min (by C-G formula based on Cr of 2.14). No results for input(s): VANCOTROUGH, VANCOPEAK, VANCORANDOM, GENTTROUGH, GENTPEAK, GENTRANDOM, TOBRATROUGH, TOBRAPEAK, TOBRARND, AMIKACINPEAK, AMIKACINTROU, AMIKACIN in the last 72 hours.   Microbiology: Recent Results (from the past 720 hour(s))  Rapid strep screen     Status: None   Collection Time: 04/13/14  2:12 PM  Result Value Ref Range Status   Streptococcus, Group A Screen (Direct) NEGATIVE NEGATIVE Final    Comment: (NOTE) A Rapid Antigen test may result negative if the antigen level in the sample is below the detection level of this test. The FDA has not cleared this test as a stand-alone test therefore the rapid antigen negative result has reflexed to a Group A Strep culture.   Culture, Group A Strep     Status: None   Collection Time: 04/13/14  2:12 PM  Result Value Ref Range Status   Specimen Description THROAT  Final   Special Requests NONE  Final   Culture   Final    No Beta Hemolytic Streptococci Isolated Performed at [facility]    Report Status 04/15/2014 FINAL  Final  Culture, blood (routine x 2)     Status: None  (Preliminary result)   Collection Time: 04/13/14  2:30 PM  Result Value Ref Range Status   Specimen Description BLOOD LEFT ARM  Final   Special Requests BOTTLES DRAWN AEROBIC AND ANAEROBIC 5 CC  Final   Culture   Final           BLOOD CULTURE RECEIVED NO GROWTH TO DATE CULTURE WILL BE HELD FOR 5 DAYS BEFORE ISSUING A FINAL NEGATIVE REPORT Performed at [facility]    Report Status PENDING  Incomplete  Culture, blood (routine x 2)     Status: None (Preliminary result)   Collection Time: 04/13/14  3:04 PM  Result Value Ref Range Status   Specimen Description BLOOD LEFT ANTECUBITAL  Final   Special Requests BOTTLES DRAWN AEROBIC ONLY 10CC  Final   Culture   Final           BLOOD CULTURE RECEIVED NO GROWTH TO DATE CULTURE WILL BE HELD FOR 5 DAYS BEFORE ISSUING A FINAL NEGATIVE REPORT Performed at [facility]    Report Status PENDING  Incomplete  MRSA PCR Screening     Status: None   Collection Time: 04/13/14  4:13 PM  Result Value Ref Range Status   MRSA by PCR NEGATIVE NEGATIVE Final    Comment:        The GeneXpert MRSA Assay (FDA approved for NASAL specimens only), is one component of a comprehensive MRSA colonization surveillance program. It is not intended to diagnose  MRSA infection nor to guide or monitor treatment for MRSA infections.     Anti-infectives    Start     Dose/Rate Route Frequency Ordered Stop   04/14/14 1300  ampicillin-sulbactam (UNASYN) 1.5 g in sodium chloride 0.9 % 50 mL IVPB     1.5 g100 mL/hr over 30 Minutes Intravenous Every 12 hours 04/14/14 0957     04/13/14 2300  piperacillin-tazobactam (ZOSYN) IVPB 3.375 g  Status:  Discontinued     3.375 g12.5 mL/hr over 240 Minutes Intravenous 3 times per day 04/13/14 1435 04/14/14 0949   04/13/14 1445  piperacillin-tazobactam (ZOSYN) IVPB 3.375 g     3.375 g100 mL/hr over 30 Minutes Intravenous STAT 04/13/14 1435 04/13/14 1544      Assessment: 81 y.o. female presents with sore throat,  painful swallowing. CT neg for swelling. Flexible laryngoscopy shows some swelling of aryepiglottic folds and post cricoid mucosa. Pt continues on unasyn D#4 for possible epiglotitis. Pt is afebrile and WBC is WNL. Dose is appropriate. Cultures have been negative to date. Planning repeat flexible laryngscopy today.  1/24 Zosyn >> 1/25 1/25 Unasyn >>  1/24 Blood cx >NGTD 1/24 Rapid strep > neg 1/24 Legionella >NEG 1/24 Strep pneumo > neg  Goal of Therapy:  Resolution of infection  Plan:  1. Continue unasyn 1.5mg  IV Q12H 2. F/u renal fxn, C&S, clinical status 3. Consider changing to PO abx since pt is taking a diet  *Pharmacy will sign-off and follow peripherally as no dose adjustments are anticipated. Thank you for the consult!  Lysle Pearl, PharmD, BCPS Pager # [phone] 04/17/2014 8:10 AM

## 2014-04-19 LAB — CULTURE, BLOOD (ROUTINE X 2)
Culture: NO GROWTH
Culture: NO GROWTH

## 2014-04-25 ENCOUNTER — Other Ambulatory Visit: Payer: Self-pay | Admitting: General Surgery

## 2014-04-25 ENCOUNTER — Encounter: Payer: Self-pay | Admitting: Internal Medicine

## 2014-04-25 ENCOUNTER — Ambulatory Visit (INDEPENDENT_AMBULATORY_CARE_PROVIDER_SITE_OTHER): Payer: Medicare Other | Admitting: Internal Medicine

## 2014-04-25 VITALS — BP 173/82 | HR 80 | Ht <= 58 in

## 2014-04-25 DIAGNOSIS — I2510 Atherosclerotic heart disease of native coronary artery without angina pectoris: Secondary | ICD-10-CM

## 2014-04-25 DIAGNOSIS — N189 Chronic kidney disease, unspecified: Secondary | ICD-10-CM

## 2014-04-25 DIAGNOSIS — E785 Hyperlipidemia, unspecified: Secondary | ICD-10-CM

## 2014-04-25 DIAGNOSIS — E039 Hypothyroidism, unspecified: Secondary | ICD-10-CM

## 2014-04-25 DIAGNOSIS — I2583 Coronary atherosclerosis due to lipid rich plaque: Principal | ICD-10-CM

## 2014-04-25 DIAGNOSIS — I519 Heart disease, unspecified: Secondary | ICD-10-CM

## 2014-04-25 DIAGNOSIS — N184 Chronic kidney disease, stage 4 (severe): Secondary | ICD-10-CM

## 2014-04-25 NOTE — Progress Notes (Signed)
OFFICE NOTE  Chief Complaint:  Wheezing  Primary Care Physician: Georgianne Fick, MD  HPI:  [name] is a pleasant 81 year old female with an unfortunate history of heart failure in the past due to severe hypothyroidism. EF has subsequently normalized. However, she does have significant peripheral arterial disease; underwent a left BKA. When I saw her last on December 13, she was having problems with rest pain in her right foot, and discoloration of the sole of her foot and toes, with tenderness to palpation. I had prescribed her for pain medicine and had reviewed her lower extremity arteriogram with Dr. Allyson Sabal, and felt that there were very few options for revascularization. She returned today and has reported some improvement with reduction in pain, and there does appear to be less discoloration. However, pulses still remain very weak. Her family reports she's also had a mild cough and does seem a little bit more fatigued. She did not report any worsening shortness of breath or orthopnea but has had some difficulty transferring. At her last office visit, her JVP is elevated and there was some trace right lower extremity edema. She did have basilar crackles and appeared to be volume overloaded. I recommended increasing her diuretics and ordered a BMP and BNP. She was to return to the office in 2 weeks for reassessment. Unfortunately she never made that appointment - ultimately I recheck some laboratory work and she was noted to have progressive renal failure. She was ultimately brought to the ED with complaints of worsening SOB and Wheezing. EMS was called to her home and found her O2 sats to be 70% and she was placed on supplemental O2, and had mild improvement. She was found to have findings of CHF, however, her EF on echo was 65-70%. She has acute on chronic renal failure - her fistula could not be accessed and a temporary dialysis catheter was placed. She was dialyzed while in the  hospital and was discharged to rehabilitation. She has not had any followup dialysis and is supposed to be getting weekly test of her renal function being sent to her nephrologist. I do not see where she has an appointment with her nephrologist.  [name] returns today after a number of recent hospitalizations. In October she underwent fistula placement this was complicated by swelling of the arm and it was felt the fistula had to be closed. She now has little if any vascular access. She was then hospitalized for epiglottitis and treated with antibiotics. She continues to be wheezy. All of her wheezes upper airway. Weight is fortunately been stable she's denied any chest pain or worsening heart failure symptoms.   PMHx:  Past Medical History  Diagnosis Date  . Coronary artery disease     a. Cath 09/2010 - med rx.  Marland Kitchen TIA (transient ischemic attack)   . Hypertension   . Shingles   . Stroke   . Anemia   . Cardiomyopathy, idiopathic 02/08/2011  . Myocardial infarct     x 3 unsure of years  . Irregular heartbeat   . Ulcer   . GERD (gastroesophageal reflux disease)   . Hx of transient ischemic attack (TIA)   . Memory loss   . Chronic diastolic CHF (congestive heart failure)     Takes Lasix  . Gout     takes allopurinol  . Peripheral vascular disease     a. s/p L BKA.  . Diabetes mellitus     type 2 NIDDM  . Chronic  kidney disease (CKD), stage IV (severe)   . Arthritis   . Hypothyroidism     (SEVERE) Takes Levothryroxine  . Nonischemic cardiomyopathy     EF now is 55%, reduced due to myxedema, which is improved.  . Dyslipidemia   . Peripheral neuropathy   . H/O echocardiogram 09/06/11    Indication- nonIschemic Cardiomyopathy. EF = now greater than 55% with no regional wall motion abnormalities. Tthere is mild to moderate trisuspid regurgitayion and mild pulmonary hypertension with an RVSP of 35 mmHg as well as stage 1 diastolic dysfunction and mild to moderate LVH.  Marland Kitchen Abnormal nuclear  stress test 06/01/09    Demonstrated a new area of infarct scar, peri-infarct ischemia seen in the inferolateral territory. EF eas normal at 70% with mild hypocontractility at the apex, distal inferolateral wall.    Past Surgical History  Procedure Laterality Date  . Back surgery      [facility]  . Eye surgery      Left eye surgery; cataract removal  . Left bka  07/05/2011  . Av fistula placement    . Amputation  07/05/2011    Procedure: AMPUTATION BELOW KNEE;  Surgeon: Chuck Hint, MD;  Location: [facility] OR;  Service: Vascular;  Laterality: Left;  . Angioplasty  1988  . Cardiac catheterization  09/28/07    Demonstrated multiple sequential lesions around 40 to 30% in the RCA territory.  . Left lower extremity venous duplex Left 06/27/11    Summary: No evidence of DVT involving the left lower extremity and right common femoral vein.   . Lower extremity arterial evaluation  06/27/11    SUMMARY: Right: ABI not ascertained due to false elevation in BP secondary to calcification (posterior tibial artery is non compressible). Left: ABI indicates moderate reduction in arterial flow. Bilateral: Great toe PPG waveforms indicate adequate perfusion. Great toe pressures not obtained due to patient's movements secondary to pain.  . Duplex doppler  05/10/11    LE arterial dopplers demonstrate bilaterally reduced ABIs of 0.91 on right & 0.56 on left. She does report some decreased pain on the left, & there's moderate mixed-density plaque in the right SFA w/50 to 69% reduction. There's a 69% reduction in the left SFA & does appear to be occlusive disease of left posterior tibial artery. Right posterior dorsalis pedis artery demonstrates occlusive disease  . Insertion of dialysis catheter N/A 01/06/2013    Procedure: INSERTION OF DIALYSIS CATHETER;  Surgeon: Larina Earthly, MD;  Location: [facility] OR;  Service: Vascular;  Laterality: N/A;  . Av fistula placement Right 12/24/2013    Procedure: INSERTION OF  ARTERIOVENOUS (AV) GORE-TEX GRAFT ARM;  Surgeon: Chuck Hint, MD;  Location: [facility] OR;  Service: Vascular;  Laterality: Right;  . Lower extremity angiogram N/A 10/31/2011    Procedure: LOWER EXTREMITY ANGIOGRAM;  Surgeon: Chuck Hint, MD;  Location: [facility] CATH LAB;  Service: Cardiovascular;  Laterality: N/A;  . Shuntogram N/A 03/17/2014    Procedure: Betsey Amen;  Surgeon: Fransisco Hertz, MD;  Location: [facility] CATH LAB;  Service: Cardiovascular;  Laterality: N/A;  . Ligation arteriovenous gortex graft Right 03/25/2014    Procedure: LIGATION ARTERIOVENOUS GORTEX GRAFT;  Surgeon: Chuck Hint, MD;  Location: [facility] OR;  Service: Vascular;  Laterality: Right;  . Esophagogastroduodenoscopy N/A 04/15/2014    Procedure: ESOPHAGOGASTRODUODENOSCOPY (EGD);  Surgeon: Rachael Fee, MD;  Location: [facility] ENDOSCOPY;  Service: Endoscopy;  Laterality: N/A;    FAMHx:  Family History  Problem Relation Age of  Onset  . Cancer Sister     STOMACH  . Diabetes Sister   . Cancer Brother     BONE  . Diabetes Brother   . Anesthesia problems Neg Hx   . Hypotension Neg Hx   . Malignant hyperthermia Neg Hx   . Pseudochol deficiency Neg Hx   . Hyperlipidemia Daughter   . Hypertension Daughter   . Heart disease Daughter     before age 15  . Kidney disease Daughter   . Other Daughter     varicose veins  . Diabetes Daughter   . Heart disease Son     before age 51  . Hyperlipidemia Son   . Hypertension Son   . Heart attack Son   . Heart attack Daughter   . Heart disease Daughter     Before age 49    SOCHx:   reports that she quit smoking about 27 years ago. She has quit using smokeless tobacco. She reports that she does not drink alcohol or use illicit drugs.  ALLERGIES:  Allergies  Allergen Reactions  . Aspirin Nausea And Vomiting    325 mg (adult strength) [name]     ROS: A comprehensive review of systems was negative except  for: Constitutional: positive for fatigue Respiratory: positive for dyspnea on exertion and wheezing  HOME MEDS: Current Outpatient Prescriptions  Medication Sig Dispense Refill  . acetaminophen (TYLENOL) 325 MG tablet Take 650 mg by mouth every 6 (six) hours as needed for mild pain.    Marland Kitchen albuterol-ipratropium (COMBIVENT) 18-103 MCG/ACT inhaler Inhale 2 puffs into the lungs every 4 (four) hours.    Marland Kitchen allopurinol (ZYLOPRIM) 100 MG tablet Take 200 mg by mouth daily.     Marland Kitchen amLODipine (NORVASC) 10 MG tablet Take 10 mg by mouth daily.    Marland Kitchen aspirin EC 81 MG tablet Take 81 mg by mouth daily.    . calcitRIOL (ROCALTROL) 0.25 MCG capsule Take 0.25 mcg by mouth every other day.     . carvedilol (COREG) 6.25 MG tablet Take 6.25 mg by mouth 2 (two) times daily with a meal.    . clopidogrel (PLAVIX) 75 MG tablet Take 75 mg by mouth daily.    Marland Kitchen docusate sodium (COLACE) 100 MG capsule Take 100 mg by mouth daily as needed for mild constipation.    . feeding supplement, GLUCERNA SHAKE, (GLUCERNA SHAKE) LIQD Take 237 mLs by mouth daily as needed (PLease offer to pt if meal intake is less than 50%).  0  . furosemide (LASIX) 80 MG tablet TAKE 1 TABLET (80 MG TOTAL) BY MOUTH 2 (TWO) TIMES DAILY. 60 tablet 2  . gabapentin (NEURONTIN) 300 MG capsule Take 300 mg by mouth 3 (three) times daily.    . insulin glargine (LANTUS) 100 UNIT/ML injection Inject 0.22 mLs (22 Units total) into the skin daily. 10 mL 11  . ipratropium-albuterol (DUONEB) 0.5-2.5 (3) MG/3ML SOLN Take 3 mLs by nebulization every 6 (six) hours as needed (for shortness of breath).    . isosorbide mononitrate (IMDUR) 60 MG 24 hr tablet Take 60 mg by mouth daily.    Marland Kitchen levothyroxine (SYNTHROID, LEVOTHROID) 75 MCG tablet Take 75 mcg by mouth daily before breakfast.    . multivitamin (RENA-VIT) TABS tablet Take 1 tablet by mouth at bedtime.  0  . nitroGLYCERIN (NITROSTAT) 0.4 MG SL tablet Place 0.4 mg under the tongue every 5 (five) minutes as needed for  chest pain.     . ONGLYZA 5 MG TABS tablet Take 5 mg by mouth daily.     Marland Kitchen oxyCODONE-acetaminophen (ROXICET) 5-325 MG per tablet Take 1-2 tablets by mouth every 4 (four) hours as needed for severe pain. 20 tablet 0  . pravastatin (PRAVACHOL) 40 MG tablet Take 40 mg by mouth daily.    Marland Kitchen RENVELA 800 MG tablet Take 800 mg by mouth 3 (three) times daily with meals.     . travoprost, benzalkonium, (TRAVATAN) 0.004 % ophthalmic solution Place 1 drop into both eyes at bedtime.      No current facility-administered medications for this visit.    LABS/IMAGING: No results found for this or any previous visit (from the past 48 hour(s)). No results found.  VITALS: BP 173/82 mmHg  Pulse 80  Ht  (1.397 m)  Wt   EXAM: General appearance: alert and no distress Neck: JVD flat, no carotid bruits, upper airway wheeze Lungs: diminished breath sounds bilaterally, faint basilar crackles Heart: regular rate and rhythm Abdomen: soft, mildly protuberant Extremities: no edema on the right leg, left is BKA Pulses: faint pulse in the right foot Skin: Skin color, texture, turgor normal. No rashes or lesions Neurologic: Mental status: Alert, oriented, thought content appropriate  EKG: deferred  ASSESSMENT: 1. Epiglottitis 2. Acute on chronic systolic heart failure exacerbation 3. Acute on chronic kidney disease stage V-hemodialysis catheter in place 4. PAD with left BKA 5. History of severe hypothyroidism with cardiomyopathy, EF improved to 65=70% 6. ESRD, not on HD - fistula in place  PLAN: 1.   Mrs. Vohs continues to decline. She has recently had problems with fistula and has limited dialysis access. She has significant PAD with a left BKA, history of heart failure and coronary disease, and recent upper airway infection which she is still recovering from. She is notably wheezy today and may need a short course of steroids if there is inflammation. She reports that there was no follow-up with ENT  that was scheduled, however I recommend that. From a cardiac standpoint she seems stable, but I'm concerned about her renal function. I cannot see her as a good dialysis candidate given her age and multi-system organ problems. Now that she has little dialysis access, there needs to be a discussion about possibly not recommending dialysis with her nephrologist.  I will see her back in 6 months.  Chrystie Nose, MD, [facility] Attending Cardiologist [facility]  HILTY,Kenneth C 04/25/2014, 4:55 PM

## 2014-04-25 NOTE — Patient Instructions (Signed)
Your physician wants you to follow-up in: 6 months with Dr. Hilty. You will receive a reminder letter in the mail two months in advance. If you don't receive a letter, please call our office to schedule the follow-up appointment.    

## 2014-05-02 NOTE — Progress Notes (Signed)
Quick Note:  Inform patient of Pathology report,. Tell her that the skin biopsy of her breast shows no cancer. I will discuss this with her in detail at her next office visit.  H.M.Derrell Lolling ______

## 2014-06-20 ENCOUNTER — Other Ambulatory Visit: Payer: Self-pay

## 2014-06-20 ENCOUNTER — Other Ambulatory Visit: Payer: Self-pay | Admitting: Internal Medicine

## 2014-06-20 DIAGNOSIS — N185 Chronic kidney disease, stage 5: Secondary | ICD-10-CM

## 2014-06-20 DIAGNOSIS — Z01810 Encounter for preprocedural cardiovascular examination: Secondary | ICD-10-CM

## 2014-06-20 NOTE — Telephone Encounter (Signed)
Rx(s) sent to pharmacy electronically.  

## 2014-06-22 ENCOUNTER — Encounter (HOSPITAL_COMMUNITY): Payer: Self-pay | Admitting: *Deleted

## 2014-06-22 ENCOUNTER — Observation Stay (HOSPITAL_COMMUNITY)
Admission: EM | Admit: 2014-06-22 | Discharge: 2014-06-23 | Disposition: A | Discharge status: Home / Self Care | Payer: Medicare Other | Attending: Internal Medicine | Admitting: Internal Medicine

## 2014-06-22 ENCOUNTER — Emergency Department (HOSPITAL_COMMUNITY): Payer: Medicare Other

## 2014-06-22 DIAGNOSIS — I252 Old myocardial infarction: Secondary | ICD-10-CM | POA: Insufficient documentation

## 2014-06-22 DIAGNOSIS — D638 Anemia in other chronic diseases classified elsewhere: Secondary | ICD-10-CM | POA: Diagnosis not present

## 2014-06-22 DIAGNOSIS — R0789 Other chest pain: Secondary | ICD-10-CM | POA: Diagnosis not present

## 2014-06-22 DIAGNOSIS — G629 Polyneuropathy, unspecified: Secondary | ICD-10-CM | POA: Diagnosis not present

## 2014-06-22 DIAGNOSIS — I2510 Atherosclerotic heart disease of native coronary artery without angina pectoris: Secondary | ICD-10-CM | POA: Diagnosis not present

## 2014-06-22 DIAGNOSIS — I429 Cardiomyopathy, unspecified: Secondary | ICD-10-CM | POA: Insufficient documentation

## 2014-06-22 DIAGNOSIS — N184 Chronic kidney disease, stage 4 (severe): Secondary | ICD-10-CM | POA: Diagnosis not present

## 2014-06-22 DIAGNOSIS — I129 Hypertensive chronic kidney disease with stage 1 through stage 4 chronic kidney disease, or unspecified chronic kidney disease: Secondary | ICD-10-CM | POA: Insufficient documentation

## 2014-06-22 DIAGNOSIS — I5032 Chronic diastolic (congestive) heart failure: Secondary | ICD-10-CM | POA: Diagnosis not present

## 2014-06-22 DIAGNOSIS — E785 Hyperlipidemia, unspecified: Secondary | ICD-10-CM | POA: Insufficient documentation

## 2014-06-22 DIAGNOSIS — Z8673 Personal history of transient ischemic attack (TIA), and cerebral infarction without residual deficits: Secondary | ICD-10-CM | POA: Diagnosis not present

## 2014-06-22 DIAGNOSIS — N179 Acute kidney failure, unspecified: Secondary | ICD-10-CM

## 2014-06-22 DIAGNOSIS — M109 Gout, unspecified: Secondary | ICD-10-CM | POA: Diagnosis not present

## 2014-06-22 DIAGNOSIS — Z87891 Personal history of nicotine dependence: Secondary | ICD-10-CM | POA: Diagnosis not present

## 2014-06-22 DIAGNOSIS — E039 Hypothyroidism, unspecified: Secondary | ICD-10-CM | POA: Insufficient documentation

## 2014-06-22 DIAGNOSIS — K219 Gastro-esophageal reflux disease without esophagitis: Secondary | ICD-10-CM | POA: Insufficient documentation

## 2014-06-22 DIAGNOSIS — Z7902 Long term (current) use of antithrombotics/antiplatelets: Secondary | ICD-10-CM | POA: Diagnosis not present

## 2014-06-22 DIAGNOSIS — Z794 Long term (current) use of insulin: Secondary | ICD-10-CM | POA: Diagnosis not present

## 2014-06-22 DIAGNOSIS — I739 Peripheral vascular disease, unspecified: Secondary | ICD-10-CM | POA: Insufficient documentation

## 2014-06-22 DIAGNOSIS — E1151 Type 2 diabetes mellitus with diabetic peripheral angiopathy without gangrene: Secondary | ICD-10-CM | POA: Diagnosis not present

## 2014-06-22 DIAGNOSIS — Z886 Allergy status to analgesic agent status: Secondary | ICD-10-CM | POA: Insufficient documentation

## 2014-06-22 DIAGNOSIS — Z9861 Coronary angioplasty status: Secondary | ICD-10-CM | POA: Diagnosis not present

## 2014-06-22 DIAGNOSIS — Z89512 Acquired absence of left leg below knee: Secondary | ICD-10-CM | POA: Diagnosis not present

## 2014-06-22 DIAGNOSIS — R079 Chest pain, unspecified: Secondary | ICD-10-CM | POA: Diagnosis not present

## 2014-06-22 DIAGNOSIS — E1159 Type 2 diabetes mellitus with other circulatory complications: Secondary | ICD-10-CM

## 2014-06-22 LAB — CBC
HCT: 34.7 % — ABNORMAL LOW (ref 36.0–46.0)
HEMATOCRIT: 37.1 % (ref 36.0–46.0)
Hemoglobin: 11.8 g/dL — ABNORMAL LOW (ref 12.0–15.0)
Hemoglobin: 10.8 g/dL — ABNORMAL LOW (ref 12.0–15.0)
MCH: 28.6 pg (ref 26.0–34.0)
MCH: 28 pg (ref 26.0–34.0)
MCHC: 31.8 g/dL (ref 30.0–36.0)
MCHC: 31.1 g/dL (ref 30.0–36.0)
MCV: 89.8 fL (ref 78.0–100.0)
MCV: 89.9 fL (ref 78.0–100.0)
Platelets: 340 10*3/uL (ref 150–400)
Platelets: 346 10*3/uL (ref 150–400)
RBC: 4.13 MIL/uL (ref 3.87–5.11)
RBC: 3.86 MIL/uL — ABNORMAL LOW (ref 3.87–5.11)
RDW: 16.9 % — AB (ref 11.5–15.5)
RDW: 16.9 % — ABNORMAL HIGH (ref 11.5–15.5)
WBC: 6.9 10*3/uL (ref 4.0–10.5)
WBC: 7.4 10*3/uL (ref 4.0–10.5)

## 2014-06-22 LAB — PROTIME-INR
INR: 1.07 (ref 0.00–1.49)
PROTHROMBIN TIME: 14 s (ref 11.6–15.2)

## 2014-06-22 LAB — BASIC METABOLIC PANEL
ANION GAP: 12 (ref 5–15)
BUN: 47 mg/dL — ABNORMAL HIGH (ref 6–23)
CALCIUM: 9.6 mg/dL (ref 8.4–10.5)
CHLORIDE: 99 mmol/L (ref 96–112)
CO2: 27 mmol/L (ref 19–32)
CREATININE: 2.24 mg/dL — AB (ref 0.50–1.10)
GFR calc Af Amer: 21 mL/min — ABNORMAL LOW (ref >90)
GFR, EST NON AFRICAN AMERICAN: 18 mL/min — AB (ref >90)
GLUCOSE: 156 mg/dL — AB (ref 70–99)
POTASSIUM: 4 mmol/L (ref 3.5–5.1)
Sodium: 138 mmol/L (ref 135–145)

## 2014-06-22 LAB — CREATININE, SERUM
CREATININE: 2.12 mg/dL — AB (ref 0.50–1.10)
GFR calc Af Amer: 23 mL/min — ABNORMAL LOW (ref >90)
GFR calc non Af Amer: 20 mL/min — ABNORMAL LOW (ref >90)

## 2014-06-22 LAB — I-STAT TROPONIN, ED: TROPONIN I, POC: 0.01 ng/mL (ref 0.00–0.08)

## 2014-06-22 LAB — TSH: TSH: 30.808 u[IU]/mL — ABNORMAL HIGH (ref 0.350–4.500)

## 2014-06-22 LAB — TROPONIN I

## 2014-06-22 LAB — GLUCOSE, CAPILLARY: GLUCOSE-CAPILLARY: 153 mg/dL — AB (ref 70–99)

## 2014-06-22 LAB — BRAIN NATRIURETIC PEPTIDE: B NATRIURETIC PEPTIDE 5: 23.9 pg/mL (ref 0.0–100.0)

## 2014-06-22 MED ORDER — ACETAMINOPHEN 325 MG PO TABS: 650.0000 mg | ORAL_TABLET | Freq: Four times a day (QID) | ORAL | Status: DC | PRN

## 2014-06-22 MED ORDER — SODIUM CHLORIDE 0.9 % IJ SOLN
3.0000 mL | Freq: Two times a day (BID) | INTRAMUSCULAR | Status: DC
  Administered 2014-06-22 – 2014-06-23 (×2): 3 mL via INTRAVENOUS

## 2014-06-22 MED ORDER — SODIUM CHLORIDE 0.9 % IV BOLUS (SEPSIS)
1000.0000 mL | Freq: Once | INTRAVENOUS | Status: AC
  Administered 2014-06-22: 1000 mL via INTRAVENOUS

## 2014-06-22 MED ORDER — NITROGLYCERIN 0.4 MG SL SUBL: 0.4000 mg | SUBLINGUAL_TABLET | SUBLINGUAL | Status: DC | PRN

## 2014-06-22 MED ORDER — CARVEDILOL 6.25 MG PO TABS
6.2500 mg | ORAL_TABLET | Freq: Two times a day (BID) | ORAL | Status: DC
  Administered 2014-06-22 – 2014-06-23 (×2): 6.25 mg via ORAL
  Filled 2014-06-22 (×4): qty 1

## 2014-06-22 MED ORDER — ACETAMINOPHEN 650 MG RE SUPP: 650.0000 mg | Freq: Four times a day (QID) | RECTAL | Status: DC | PRN

## 2014-06-22 MED ORDER — INSULIN ASPART 100 UNIT/ML ~~LOC~~ SOLN: 0.0000 [IU] | Freq: Every day | SUBCUTANEOUS | Status: DC

## 2014-06-22 MED ORDER — GABAPENTIN 300 MG PO CAPS
300.0000 mg | ORAL_CAPSULE | Freq: Three times a day (TID) | ORAL | Status: DC
  Administered 2014-06-22 – 2014-06-23 (×2): 300 mg via ORAL
  Filled 2014-06-22 (×4): qty 1

## 2014-06-22 MED ORDER — MORPHINE SULFATE 2 MG/ML IJ SOLN
1.0000 mg | INTRAMUSCULAR | Status: DC | PRN
  Administered 2014-06-23: 1 mg via INTRAVENOUS
  Filled 2014-06-22: qty 1

## 2014-06-22 MED ORDER — LATANOPROST 0.005 % OP SOLN
1.0000 [drp] | Freq: Every day | OPHTHALMIC | Status: DC
  Administered 2014-06-22: 1 [drp] via OPHTHALMIC
  Filled 2014-06-22: qty 2.5

## 2014-06-22 MED ORDER — HEPARIN SODIUM (PORCINE) 5000 UNIT/ML IJ SOLN
5000.0000 [IU] | Freq: Three times a day (TID) | INTRAMUSCULAR | Status: DC
Start: 2014-06-22 — End: 2014-06-23
  Administered 2014-06-22: 5000 [IU] via SUBCUTANEOUS
  Filled 2014-06-22 (×4): qty 1

## 2014-06-22 MED ORDER — DOCUSATE SODIUM 100 MG PO CAPS
100.0000 mg | ORAL_CAPSULE | Freq: Every day | ORAL | Status: DC | PRN
Start: 2014-06-22 — End: 2014-06-23
  Filled 2014-06-22: qty 1

## 2014-06-22 MED ORDER — IPRATROPIUM-ALBUTEROL 0.5-2.5 (3) MG/3ML IN SOLN: 3.0000 mL | RESPIRATORY_TRACT | Status: DC | PRN

## 2014-06-22 MED ORDER — ALLOPURINOL 100 MG PO TABS
200.0000 mg | ORAL_TABLET | Freq: Every day | ORAL | Status: DC
  Administered 2014-06-23: 200 mg via ORAL
  Filled 2014-06-22: qty 2

## 2014-06-22 MED ORDER — SODIUM CHLORIDE 0.9 % IV SOLN: INTRAVENOUS | Status: DC

## 2014-06-22 MED ORDER — PRAVASTATIN SODIUM 40 MG PO TABS
40.0000 mg | ORAL_TABLET | Freq: Every day | ORAL | Status: DC
  Administered 2014-06-23: 40 mg via ORAL
  Filled 2014-06-22: qty 1

## 2014-06-22 MED ORDER — NITROGLYCERIN 2 % TD OINT
1.0000 [in_us] | TOPICAL_OINTMENT | Freq: Once | TRANSDERMAL | Status: AC
  Administered 2014-06-22: 1 [in_us] via TOPICAL
  Filled 2014-06-22: qty 1

## 2014-06-22 MED ORDER — ONDANSETRON HCL 4 MG/2ML IJ SOLN
4.0000 mg | Freq: Four times a day (QID) | INTRAMUSCULAR | Status: DC | PRN
  Administered 2014-06-23: 4 mg via INTRAVENOUS
  Filled 2014-06-22: qty 2

## 2014-06-22 MED ORDER — ONDANSETRON HCL 4 MG PO TABS: 4.0000 mg | ORAL_TABLET | Freq: Four times a day (QID) | ORAL | Status: DC | PRN

## 2014-06-22 MED ORDER — LEVOTHYROXINE SODIUM 75 MCG PO TABS
75.0000 ug | ORAL_TABLET | Freq: Every day | ORAL | Status: DC
  Administered 2014-06-23: 75 ug via ORAL
  Filled 2014-06-22 (×2): qty 1

## 2014-06-22 MED ORDER — GLUCERNA SHAKE PO LIQD: 237.0000 mL | Freq: Every day | ORAL | Status: DC | PRN

## 2014-06-22 MED ORDER — TRAVOPROST 0.004 % OP SOLN: 1.0000 [drp] | Freq: Every day | OPHTHALMIC | Status: DC

## 2014-06-22 MED ORDER — IPRATROPIUM-ALBUTEROL 18-103 MCG/ACT IN AERO: 2.0000 | INHALATION_SPRAY | RESPIRATORY_TRACT | Status: DC | PRN

## 2014-06-22 MED ORDER — CALCITRIOL 0.25 MCG PO CAPS
0.2500 ug | ORAL_CAPSULE | ORAL | Status: DC
  Administered 2014-06-23: 0.25 ug via ORAL
  Filled 2014-06-22: qty 1

## 2014-06-22 MED ORDER — INSULIN ASPART 100 UNIT/ML ~~LOC~~ SOLN
0.0000 [IU] | Freq: Three times a day (TID) | SUBCUTANEOUS | Status: DC
  Administered 2014-06-23 (×2): 1 [IU] via SUBCUTANEOUS

## 2014-06-22 MED ORDER — SEVELAMER CARBONATE 800 MG PO TABS
800.0000 mg | ORAL_TABLET | Freq: Three times a day (TID) | ORAL | Status: DC
  Administered 2014-06-23 (×2): 800 mg via ORAL
  Filled 2014-06-22 (×4): qty 1

## 2014-06-22 MED ORDER — FUROSEMIDE 80 MG PO TABS
80.0000 mg | ORAL_TABLET | Freq: Every day | ORAL | Status: DC
  Administered 2014-06-23: 80 mg via ORAL
  Filled 2014-06-22: qty 1

## 2014-06-22 MED ORDER — ISOSORBIDE MONONITRATE ER 60 MG PO TB24
60.0000 mg | ORAL_TABLET | Freq: Every day | ORAL | Status: DC
  Administered 2014-06-23: 60 mg via ORAL
  Filled 2014-06-22: qty 1

## 2014-06-22 MED ORDER — OXYCODONE-ACETAMINOPHEN 5-325 MG PO TABS: 1.0000 | ORAL_TABLET | ORAL | Status: DC | PRN

## 2014-06-22 MED ORDER — INSULIN GLARGINE 100 UNIT/ML ~~LOC~~ SOLN
22.0000 [IU] | Freq: Every day | SUBCUTANEOUS | Status: DC
  Administered 2014-06-23: 22 [IU] via SUBCUTANEOUS
  Filled 2014-06-22: qty 0.22

## 2014-06-22 MED ORDER — ASPIRIN EC 81 MG PO TBEC
81.0000 mg | DELAYED_RELEASE_TABLET | Freq: Every day | ORAL | Status: DC
  Administered 2014-06-23: 81 mg via ORAL
  Filled 2014-06-22: qty 1

## 2014-06-22 MED ORDER — CLOPIDOGREL BISULFATE 75 MG PO TABS
75.0000 mg | ORAL_TABLET | Freq: Every day | ORAL | Status: DC
  Administered 2014-06-23: 75 mg via ORAL
  Filled 2014-06-22: qty 1

## 2014-06-22 MED ORDER — HYDROCODONE-ACETAMINOPHEN 5-325 MG PO TABS
1.0000 | ORAL_TABLET | ORAL | Status: DC | PRN
  Administered 2014-06-22 – 2014-06-23 (×3): 2 via ORAL
  Filled 2014-06-22 (×3): qty 2

## 2014-06-22 NOTE — ED Notes (Signed)
Pt reports onset last night of mid chest pains, mild sob. Denies recent cough. No swelling noted. ekg done. spo2 100%.

## 2014-06-22 NOTE — ED Provider Notes (Signed)
Medical screening examination/treatment/procedure(s) were conducted as a shared visit with non-physician practitioner(s) and myself.  I personally evaluated the patient during the encounter.   EKG Interpretation   Date/Time:  Sunday June 22 2014 12:58:28 EDT Ventricular Rate:  72 PR Interval:  188 QRS Duration: 78 QT Interval:  438 QTC Calculation: 479 R Axis:   6 Text Interpretation:  Normal sinus rhythm Low voltage QRS Septal infarct ,  age undetermined Abnormal ECG No significant change since last tracing  Confirmed by Evah Rashid  MD, Naviyah Schaffert ([id number]) on 06/22/2014 3:26:48 PM     Patient here with having intermittent left-sided chest pain which occasionally goes to her left arm. Took nitroglycerin with relief. Symptoms are similar to her anginal complaints she is chest pain-free at this time. We'll admit to medicine service  Lorre Nick, MD 06/22/14 1537

## 2014-06-22 NOTE — H&P (Signed)
[facility] History and Physical  [name] YNW:[id number] DOB: 1923-04-20 DOA: 06/22/2014  Referring physician: EDP PCP: Georgianne Fick, MD   Chief Complaint: Chest pain  HPI: [name] is a 81 y.o. female with past medical history of CAD, last cath and 2012 showed 80% stenosis and recommended optimal medical management, patient has chronic CHF, hypertension and left BKA. Patient became to the hospital complaining about chest pain. Patient said she was in her usual state of health until last night when she developed some chest pain, is constant, 8-9/10 intensity, sharp, goes toward her left shoulder. When she woke up this morning the pain was more severe so she came into the hospital for further evaluation. In the ED first set of cardiac enzymes being negative, 12-lead EKG showed low voltage and no evidence of ischemia. Patient admitted to the hospital overnight to rule out ACS.   Review of Systems:  Constitutional: negative for anorexia, fevers and sweats Eyes: negative for irritation, redness and visual disturbance Ears, nose, mouth, throat, and face: negative for earaches, epistaxis, nasal congestion and sore throat Respiratory: negative for cough, dyspnea on exertion, sputum and wheezing Cardiovascular: Per HPI Gastrointestinal: negative for abdominal pain, constipation, diarrhea, melena, nausea and vomiting Genitourinary:negative for dysuria, frequency and hematuria Hematologic/lymphatic: negative for bleeding, easy bruising and lymphadenopathy Musculoskeletal:negative for arthralgias, muscle weakness and stiff joints Neurological: negative for coordination problems, gait problems, headaches and weakness Endocrine: negative for diabetic symptoms including polydipsia, polyuria and weight loss Allergic/Immunologic: negative for anaphylaxis, hay fever and urticaria  Past Medical History  Diagnosis Date  . Coronary artery disease     a. Cath 09/2010 - med rx.  Marland Kitchen  TIA (transient ischemic attack)   . Hypertension   . Shingles   . Stroke   . Anemia   . Cardiomyopathy, idiopathic 02/08/2011  . Myocardial infarct     x 3 unsure of years  . Irregular heartbeat   . Ulcer   . GERD (gastroesophageal reflux disease)   . Hx of transient ischemic attack (TIA)   . Memory loss   . Chronic diastolic CHF (congestive heart failure)     Takes Lasix  . Gout     takes allopurinol  . Peripheral vascular disease     a. s/p L BKA.  . Diabetes mellitus     type 2 NIDDM  . Chronic kidney disease (CKD), stage IV (severe)   . Arthritis   . Hypothyroidism     (SEVERE) Takes Levothryroxine  . Nonischemic cardiomyopathy     EF now is 55%, reduced due to myxedema, which is improved.  . Dyslipidemia   . Peripheral neuropathy   . H/O echocardiogram 09/06/11    Indication- nonIschemic Cardiomyopathy. EF = now greater than 55% with no regional wall motion abnormalities. Tthere is mild to moderate trisuspid regurgitayion and mild pulmonary hypertension with an RVSP of 35 mmHg as well as stage 1 diastolic dysfunction and mild to moderate LVH.  Marland Kitchen Abnormal nuclear stress test 06/01/09    Demonstrated a new area of infarct scar, peri-infarct ischemia seen in the inferolateral territory. EF eas normal at 70% with mild hypocontractility at the apex, distal inferolateral wall.   Past Surgical History  Procedure Laterality Date  . Back surgery      [facility]  . Eye surgery      Left eye surgery; cataract removal  . Left bka  07/05/2011  . Av fistula placement    . Amputation  07/05/2011  Procedure: AMPUTATION BELOW KNEE;  Surgeon: Chuck Hint, MD;  Location: [facility] OR;  Service: Vascular;  Laterality: Left;  . Angioplasty  1988  . Cardiac catheterization  09/28/07    Demonstrated multiple sequential lesions around 40 to 30% in the RCA territory.  . Left lower extremity venous duplex Left 06/27/11    Summary: No evidence of DVT involving the left lower  extremity and right common femoral vein.   . Lower extremity arterial evaluation  06/27/11    SUMMARY: Right: ABI not ascertained due to false elevation in BP secondary to calcification (posterior tibial artery is non compressible). Left: ABI indicates moderate reduction in arterial flow. Bilateral: Great toe PPG waveforms indicate adequate perfusion. Great toe pressures not obtained due to patient's movements secondary to pain.  . Duplex doppler  05/10/11    LE arterial dopplers demonstrate bilaterally reduced ABIs of 0.91 on right & 0.56 on left. She does report some decreased pain on the left, & there's moderate mixed-density plaque in the right SFA w/50 to 69% reduction. There's a 69% reduction in the left SFA & does appear to be occlusive disease of left posterior tibial artery. Right posterior dorsalis pedis artery demonstrates occlusive disease  . Insertion of dialysis catheter N/A 01/06/2013    Procedure: INSERTION OF DIALYSIS CATHETER;  Surgeon: Larina Earthly, MD;  Location: [facility] OR;  Service: Vascular;  Laterality: N/A;  . Av fistula placement Right 12/24/2013    Procedure: INSERTION OF ARTERIOVENOUS (AV) GORE-TEX GRAFT ARM;  Surgeon: Chuck Hint, MD;  Location: [facility] OR;  Service: Vascular;  Laterality: Right;  . Lower extremity angiogram N/A 10/31/2011    Procedure: LOWER EXTREMITY ANGIOGRAM;  Surgeon: Chuck Hint, MD;  Location: [facility] CATH LAB;  Service: Cardiovascular;  Laterality: N/A;  . Shuntogram N/A 03/17/2014    Procedure: Betsey Amen;  Surgeon: Fransisco Hertz, MD;  Location: [facility] CATH LAB;  Service: Cardiovascular;  Laterality: N/A;  . Ligation arteriovenous gortex graft Right 03/25/2014    Procedure: LIGATION ARTERIOVENOUS GORTEX GRAFT;  Surgeon: Chuck Hint, MD;  Location: [facility] OR;  Service: Vascular;  Laterality: Right;  . Esophagogastroduodenoscopy N/A 04/15/2014    Procedure: ESOPHAGOGASTRODUODENOSCOPY (EGD);  Surgeon: Rachael Fee, MD;  Location: [facility] ENDOSCOPY;   Service: Endoscopy;  Laterality: N/A;   Social History:   reports that she quit smoking about 27 years ago. She has quit using smokeless tobacco. She reports that she does not drink alcohol or use illicit drugs.  Allergies  Allergen Reactions  . Aspirin Nausea And Vomiting    325 mg (adult strength) [name]     Family History  Problem Relation Age of Onset  . Cancer Sister     STOMACH  . Diabetes Sister   . Cancer Brother     BONE  . Diabetes Brother   . Anesthesia problems Neg Hx   . Hypotension Neg Hx   . Malignant hyperthermia Neg Hx   . Pseudochol deficiency Neg Hx   . Hyperlipidemia Daughter   . Hypertension Daughter   . Heart disease Daughter     before age 15  . Kidney disease Daughter   . Other Daughter     varicose veins  . Diabetes Daughter   . Heart disease Son     before age 31  . Hyperlipidemia Son   . Hypertension Son   . Heart attack Son   . Heart attack Daughter   .  Heart disease Daughter     Before age 102     Prior to Admission medications   Medication Sig Start Date End Date Taking? Authorizing Provider  acetaminophen (TYLENOL) 325 MG tablet Take 650 mg by mouth every 6 (six) hours as needed for mild pain.   Yes Historical Provider, MD  albuterol-ipratropium (COMBIVENT) 18-103 MCG/ACT inhaler Inhale 2 puffs into the lungs every 4 (four) hours as needed for wheezing or shortness of breath.    Yes Historical Provider, MD  allopurinol (ZYLOPRIM) 100 MG tablet Take 200 mg by mouth daily.    Yes Historical Provider, MD  amLODipine (NORVASC) 5 MG tablet Take 5 mg by mouth daily. 06/04/14  Yes Historical Provider, MD  aspirin EC 81 MG tablet Take 81 mg by mouth daily.   Yes Historical Provider, MD  calcitRIOL (ROCALTROL) 0.25 MCG capsule Take 0.25 mcg by mouth every other day.  11/04/12  Yes Historical Provider, MD  carvedilol (COREG) 6.25 MG tablet Take 6.25 mg by mouth 2 (two) times daily with a  meal.   Yes Historical Provider, MD  clopidogrel (PLAVIX) 75 MG tablet Take 75 mg by mouth daily.   Yes Historical Provider, MD  feeding supplement, GLUCERNA SHAKE, (GLUCERNA SHAKE) LIQD Take 237 mLs by mouth daily as needed (PLease offer to pt if meal intake is less than 50%). 08/23/13  Yes [name], MD  furosemide (LASIX) 80 MG tablet TAKE 1 TABLET (80 MG TOTAL) BY MOUTH 2 (TWO) TIMES DAILY. 06/20/14  Yes Chrystie Nose, MD  gabapentin (NEURONTIN) 300 MG capsule Take 300 mg by mouth 3 (three) times daily.   Yes Historical Provider, MD  insulin glargine (LANTUS) 100 UNIT/ML injection Inject 0.22 mLs (22 Units total) into the skin daily. 08/23/13  Yes [name], MD  isosorbide mononitrate (IMDUR) 60 MG 24 hr tablet Take 60 mg by mouth daily.   Yes Historical Provider, MD  levothyroxine (SYNTHROID, LEVOTHROID) 75 MCG tablet Take 75 mcg by mouth daily before breakfast.   Yes Historical Provider, MD  nitroGLYCERIN (NITROSTAT) 0.4 MG SL tablet Place 0.4 mg under the tongue every 5 (five) minutes as needed for chest pain.    Yes Historical Provider, MD  oxyCODONE-acetaminophen (ROXICET) 5-325 MG per tablet Take 1-2 tablets by mouth every 4 (four) hours as needed for severe pain. 03/25/14  Yes Chuck Hint, MD  pravastatin (PRAVACHOL) 40 MG tablet Take 40 mg by mouth daily.   Yes Historical Provider, MD  RENVELA 800 MG tablet Take 800 mg by mouth 3 (three) times daily with meals.  07/04/13  Yes Historical Provider, MD  travoprost, benzalkonium, (TRAVATAN) 0.004 % ophthalmic solution Place 1 drop into both eyes at bedtime.    Yes Historical Provider, MD  docusate sodium (COLACE) 100 MG capsule Take 100 mg by mouth daily as needed for mild constipation.    Historical Provider, MD  multivitamin (RENA-VIT) TABS tablet Take 1 tablet by mouth at bedtime. Patient not taking: Reported on 06/22/2014 01/15/13   Osvaldo Shipper, MD   Physical Exam: Filed Vitals:   06/22/14 1633  BP: 189/75  Pulse: 74    Temp:   Resp: 21   Constitutional: Oriented to person, place, and time. Well-developed and well-nourished. Cooperative.  Head: Normocephalic and atraumatic.  Nose: Nose normal.  Mouth/Throat: Uvula is midline, oropharynx is clear and moist and mucous membranes are normal.  Eyes: Conjunctivae and EOM are normal. Pupils are equal, round, and reactive to light.  Neck: Trachea normal and normal  range of motion. Neck supple.  Cardiovascular: Normal rate, regular rhythm, S1 normal, S2 normal, normal heart sounds and intact distal pulses.   Pulmonary/Chest: Effort normal and breath sounds normal.  Abdominal: Soft. Bowel sounds are normal. There is no hepatosplenomegaly. There is no tenderness.  Musculoskeletal: Normal range of motion.  Neurological: Alert and oriented to person, place, and time. Has normal strength. No cranial nerve deficit or sensory deficit.  Skin: Skin is warm, dry and intact.  Psychiatric: Has a normal mood and affect. Speech is normal and behavior is normal.   Labs on Admission:  Basic Metabolic Panel:  Recent Labs Lab 06/22/14 1339  NA 138  K 4.0  CL 99  CO2 27  GLUCOSE 156*  BUN 47*  CREATININE 2.24*  CALCIUM 9.6   Liver Function Tests: No results for input(s): AST, ALT, ALKPHOS, BILITOT, PROT, ALBUMIN in the last 168 hours. No results for input(s): LIPASE, AMYLASE in the last 168 hours. No results for input(s): AMMONIA in the last 168 hours. CBC:  Recent Labs Lab 06/22/14 1339  WBC 6.9  HGB 11.8*  HCT 37.1  MCV 89.8  PLT 340   Cardiac Enzymes: No results for input(s): CKTOTAL, CKMB, CKMBINDEX, TROPONINI in the last 168 hours.  BNP (last 3 results)  Recent Labs  06/22/14 1339  BNP 23.9    ProBNP (last 3 results)  Recent Labs  08/15/13 1424 08/15/13 2015 03/02/14 1919  PROBNP 30760.0* 31926.0* 650.8*    CBG: No results for input(s): GLUCAP in the last 168 hours.  Radiological Exams on Admission: Dg Chest 2 View  06/22/2014    CLINICAL DATA:  Midsternal and left upper chest pain.  Ex-smoker.  EXAM: CHEST  2 VIEW  COMPARISON:  03/02/2014  FINDINGS: Lateral view degraded by patient arm position. Apical lordotic frontal film. Midline trachea. Mild cardiomegaly. Atherosclerosis in the transverse aorta. Mediastinal contours otherwise within normal limits. No pleural effusion or pneumothorax. No congestive failure. Mild left base scarring or atelectasis.  IMPRESSION: No acute cardiopulmonary disease.  Cardiomegaly without congestive failure.   Electronically Signed   By: Jeronimo Greaves M.D.   On: 06/22/2014 14:22    EKG: Independently reviewed. Low voltage EKG, sinus rhythm rate 72, poor R-wave progression  Assessment/Plan Principal Problem:   Chest pain Active Problems:   CAD - moderate at cath July 2012 (medical Rx)   Diabetes mellitus type 2 with peripheral artery disease   Anemia of chronic disease   CKD (chronic kidney disease), stage IV    Chest pain Atypical chest pain, reproducible to my exam. Rule out ACS by cycling 3 sets of cardiac enzymes and repeat EKG in a.m. Patient is on optimal medical therapy continue current medications. Patient is on Coreg, Imdur, ASA and Plavix. She is on amlodipine, I held it because of lower blood pressure. Provide pain medications at this is will probably turn to be musculoskeletal pain.  Diabetes mellitus type 2 With PVD, check hemoglobin A1c, place patient on SSI. Patient is on Lantus, continue 22 units, carb modified diet.  Anemia of chronic disease Very mild anemia likely secondary to chronic kidney disease.   CKD stage IV Patient had AV fistula for dialysis access created twice, and clogged both times. Continue Lasix 80 mg by mouth daily. Check BMP in a.m.  Code Status: Full code Family Communication: Plan discussed with the patient in presence of her son and daughter at bedside. Disposition Plan: Telemetry, observation  Time spent: 70 minutes  Lorece Keach A,  MD  [facility] Pager [phone]

## 2014-06-22 NOTE — ED Notes (Signed)
Patient returned from X-ray 

## 2014-06-22 NOTE — ED Provider Notes (Cosign Needed)
CSN: [id number]     Arrival date & time 06/22/14  1251 History   First MD Initiated Contact with Patient 06/22/14 1306     Chief Complaint  Patient presents with  . Chest Pain     (Consider location/radiation/quality/duration/timing/severity/associated sxs/prior Treatment) Patient is a 81 y.o. female presenting with chest pain. The history is provided by the patient and a relative. No language interpreter was used.  Chest Pain Associated symptoms: shortness of breath   Associated symptoms: no cough, no dizziness and no weakness   [name] is a 81 y.o black female who has a history of HTN, CHF, DM, TIA, and dyslipidemia who presents with gradual onset chest pain that began last night.  She took a nitroglycerin yesterday and this morning with some relief. Her pain is a 2/10 now. She states it is hard to describe the pain but it is constant and does not radiate. Nothing makes the pain worse. She denies any headache, diaphoresis, nausea, vomiting, abdominal pain, or urinary symptoms.   Past Medical History  Diagnosis Date  . Coronary artery disease     a. Cath 09/2010 - med rx.  Marland Kitchen TIA (transient ischemic attack)   . Hypertension   . Shingles   . Stroke   . Anemia   . Cardiomyopathy, idiopathic 02/08/2011  . Myocardial infarct     x 3 unsure of years  . Irregular heartbeat   . Ulcer   . GERD (gastroesophageal reflux disease)   . Hx of transient ischemic attack (TIA)   . Memory loss   . Chronic diastolic CHF (congestive heart failure)     Takes Lasix  . Gout     takes allopurinol  . Peripheral vascular disease     a. s/p L BKA.  . Diabetes mellitus     type 2 NIDDM  . Chronic kidney disease (CKD), stage IV (severe)   . Arthritis   . Hypothyroidism     (SEVERE) Takes Levothryroxine  . Nonischemic cardiomyopathy     EF now is 55%, reduced due to myxedema, which is improved.  . Dyslipidemia   . Peripheral neuropathy   . H/O echocardiogram 09/06/11    Indication- nonIschemic  Cardiomyopathy. EF = now greater than 55% with no regional wall motion abnormalities. Tthere is mild to moderate trisuspid regurgitayion and mild pulmonary hypertension with an RVSP of 35 mmHg as well as stage 1 diastolic dysfunction and mild to moderate LVH.  Marland Kitchen Abnormal nuclear stress test 06/01/09    Demonstrated a new area of infarct scar, peri-infarct ischemia seen in the inferolateral territory. EF eas normal at 70% with mild hypocontractility at the apex, distal inferolateral wall.   Past Surgical History  Procedure Laterality Date  . Back surgery      [facility]  . Eye surgery      Left eye surgery; cataract removal  . Left bka  07/05/2011  . Av fistula placement    . Amputation  07/05/2011    Procedure: AMPUTATION BELOW KNEE;  Surgeon: Chuck Hint, MD;  Location: [facility] OR;  Service: Vascular;  Laterality: Left;  . Angioplasty  1988  . Cardiac catheterization  09/28/07    Demonstrated multiple sequential lesions around 40 to 30% in the RCA territory.  . Left lower extremity venous duplex Left 06/27/11    Summary: No evidence of DVT involving the left lower extremity and right common femoral vein.   . Lower extremity arterial evaluation  06/27/11    SUMMARY:  Right: ABI not ascertained due to false elevation in BP secondary to calcification (posterior tibial artery is non compressible). Left: ABI indicates moderate reduction in arterial flow. Bilateral: Great toe PPG waveforms indicate adequate perfusion. Great toe pressures not obtained due to patient's movements secondary to pain.  . Duplex doppler  05/10/11    LE arterial dopplers demonstrate bilaterally reduced ABIs of 0.91 on right & 0.56 on left. She does report some decreased pain on the left, & there's moderate mixed-density plaque in the right SFA w/50 to 69% reduction. There's a 69% reduction in the left SFA & does appear to be occlusive disease of left posterior tibial artery. Right posterior dorsalis pedis artery  demonstrates occlusive disease  . Insertion of dialysis catheter N/A 01/06/2013    Procedure: INSERTION OF DIALYSIS CATHETER;  Surgeon: Larina Earthly, MD;  Location: [facility] OR;  Service: Vascular;  Laterality: N/A;  . Av fistula placement Right 12/24/2013    Procedure: INSERTION OF ARTERIOVENOUS (AV) GORE-TEX GRAFT ARM;  Surgeon: Chuck Hint, MD;  Location: [facility] OR;  Service: Vascular;  Laterality: Right;  . Lower extremity angiogram N/A 10/31/2011    Procedure: LOWER EXTREMITY ANGIOGRAM;  Surgeon: Chuck Hint, MD;  Location: [facility] CATH LAB;  Service: Cardiovascular;  Laterality: N/A;  . Shuntogram N/A 03/17/2014    Procedure: Betsey Amen;  Surgeon: Fransisco Hertz, MD;  Location: [facility] CATH LAB;  Service: Cardiovascular;  Laterality: N/A;  . Ligation arteriovenous gortex graft Right 03/25/2014    Procedure: LIGATION ARTERIOVENOUS GORTEX GRAFT;  Surgeon: Chuck Hint, MD;  Location: [facility] OR;  Service: Vascular;  Laterality: Right;  . Esophagogastroduodenoscopy N/A 04/15/2014    Procedure: ESOPHAGOGASTRODUODENOSCOPY (EGD);  Surgeon: Rachael Fee, MD;  Location: [facility] ENDOSCOPY;  Service: Endoscopy;  Laterality: N/A;   Family History  Problem Relation Age of Onset  . Cancer Sister     STOMACH  . Diabetes Sister   . Cancer Brother     BONE  . Diabetes Brother   . Anesthesia problems Neg Hx   . Hypotension Neg Hx   . Malignant hyperthermia Neg Hx   . Pseudochol deficiency Neg Hx   . Hyperlipidemia Daughter   . Hypertension Daughter   . Heart disease Daughter     before age 85  . Kidney disease Daughter   . Other Daughter     varicose veins  . Diabetes Daughter   . Heart disease Son     before age 63  . Hyperlipidemia Son   . Hypertension Son   . Heart attack Son   . Heart attack Daughter   . Heart disease Daughter     Before age 93   History  Substance Use Topics  . Smoking status: Former Smoker -- 0.50 packs/day for 40 years    Quit date: 07/05/1986  . Smokeless  tobacco: Former Neurosurgeon     Comment: quit smoking 1988  . Alcohol Use: No   OB History    No data available     Review of Systems  Respiratory: Positive for shortness of breath. Negative for cough.   Cardiovascular: Positive for chest pain.  Neurological: Negative for dizziness, seizures, syncope and weakness.  All other systems reviewed and are negative.     Allergies  Aspirin  Home Medications   Prior to Admission medications   Medication Sig Start Date End Date Taking? Authorizing Provider  acetaminophen (TYLENOL) 325 MG tablet Take 650 mg by mouth every 6 (six) hours as needed for  mild pain.   Yes Historical Provider, MD  albuterol-ipratropium (COMBIVENT) 18-103 MCG/ACT inhaler Inhale 2 puffs into the lungs every 4 (four) hours as needed for wheezing or shortness of breath.    Yes Historical Provider, MD  allopurinol (ZYLOPRIM) 100 MG tablet Take 200 mg by mouth daily.    Yes Historical Provider, MD  amLODipine (NORVASC) 5 MG tablet Take 5 mg by mouth daily. 06/04/14  Yes Historical Provider, MD  aspirin EC 81 MG tablet Take 81 mg by mouth daily.   Yes Historical Provider, MD  calcitRIOL (ROCALTROL) 0.25 MCG capsule Take 0.25 mcg by mouth every other day.  11/04/12  Yes Historical Provider, MD  carvedilol (COREG) 6.25 MG tablet Take 6.25 mg by mouth 2 (two) times daily with a meal.   Yes Historical Provider, MD  clopidogrel (PLAVIX) 75 MG tablet Take 75 mg by mouth daily.   Yes Historical Provider, MD  feeding supplement, GLUCERNA SHAKE, (GLUCERNA SHAKE) LIQD Take 237 mLs by mouth daily as needed (PLease offer to pt if meal intake is less than 50%). 08/23/13  Yes [name], MD  furosemide (LASIX) 80 MG tablet TAKE 1 TABLET (80 MG TOTAL) BY MOUTH 2 (TWO) TIMES DAILY. 06/20/14  Yes Chrystie Nose, MD  gabapentin (NEURONTIN) 300 MG capsule Take 300 mg by mouth 3 (three) times daily.   Yes Historical Provider, MD  insulin glargine (LANTUS) 100 UNIT/ML injection Inject 0.22 mLs (22  Units total) into the skin daily. 08/23/13  Yes [name], MD  isosorbide mononitrate (IMDUR) 60 MG 24 hr tablet Take 60 mg by mouth daily.   Yes Historical Provider, MD  levothyroxine (SYNTHROID, LEVOTHROID) 75 MCG tablet Take 75 mcg by mouth daily before breakfast.   Yes Historical Provider, MD  nitroGLYCERIN (NITROSTAT) 0.4 MG SL tablet Place 0.4 mg under the tongue every 5 (five) minutes as needed for chest pain.    Yes Historical Provider, MD  oxyCODONE-acetaminophen (ROXICET) 5-325 MG per tablet Take 1-2 tablets by mouth every 4 (four) hours as needed for severe pain. 03/25/14  Yes Chuck Hint, MD  pravastatin (PRAVACHOL) 40 MG tablet Take 40 mg by mouth daily.   Yes Historical Provider, MD  RENVELA 800 MG tablet Take 800 mg by mouth 3 (three) times daily with meals.  07/04/13  Yes Historical Provider, MD  travoprost, benzalkonium, (TRAVATAN) 0.004 % ophthalmic solution Place 1 drop into both eyes at bedtime.    Yes Historical Provider, MD  docusate sodium (COLACE) 100 MG capsule Take 100 mg by mouth daily as needed for mild constipation.    Historical Provider, MD  multivitamin (RENA-VIT) TABS tablet Take 1 tablet by mouth at bedtime. Patient not taking: Reported on 06/22/2014 01/15/13   Osvaldo Shipper, MD   BP 161/55 mmHg  Pulse 66  Temp(Src) 98.3 F (36.8 C) (Oral)  Resp 18  Ht 4\' 11"  (1.499 m)  Wt 116 lb 10.3 oz (52.91 kg)  BMI 23.55 kg/m2  SpO2 97% Physical Exam  Constitutional: She is oriented to person, place, and time. She appears well-developed and well-nourished.  HENT:  Head: Normocephalic and atraumatic.  Eyes: Conjunctivae are normal.  Neck: Normal range of motion. Neck supple.  Cardiovascular: Normal rate, regular rhythm and normal heart sounds.   Pulmonary/Chest: Effort normal and breath sounds normal.  Abdominal: Soft. There is no tenderness.  Musculoskeletal: Normal range of motion.  Left leg below knee amputation.   Neurological: She is alert and  oriented to person, place, and time.  Skin: Skin is warm and dry.  Nursing note and vitals reviewed.   ED Course  Procedures (including critical care time) Labs Review Labs Reviewed  CBC - Abnormal; Notable for the following:    Hemoglobin 11.8 (*)    RDW 16.9 (*)    All other components within normal limits  BASIC METABOLIC PANEL - Abnormal; Notable for the following:    Glucose, Bld 156 (*)    BUN 47 (*)    Creatinine, Ser 2.24 (*)    GFR calc non Af Amer 18 (*)    GFR calc Af Amer 21 (*)    All other components within normal limits  CBC - Abnormal; Notable for the following:    RBC 3.86 (*)    Hemoglobin 10.8 (*)    HCT 34.7 (*)    RDW 16.9 (*)    All other components within normal limits  CREATININE, SERUM - Abnormal; Notable for the following:    Creatinine, Ser 2.12 (*)    GFR calc non Af Amer 20 (*)    GFR calc Af Amer 23 (*)    All other components within normal limits  TSH - Abnormal; Notable for the following:    TSH 30.808 (*)    All other components within normal limits  BASIC METABOLIC PANEL - Abnormal; Notable for the following:    Glucose, Bld 138 (*)    BUN 47 (*)    Creatinine, Ser 2.17 (*)    GFR calc non Af Amer 19 (*)    GFR calc Af Amer 22 (*)    All other components within normal limits  CBC - Abnormal; Notable for the following:    Hemoglobin 11.4 (*)    RDW 17.0 (*)    All other components within normal limits  GLUCOSE, CAPILLARY - Abnormal; Notable for the following:    Glucose-Capillary 153 (*)    All other components within normal limits  GLUCOSE, CAPILLARY - Abnormal; Notable for the following:    Glucose-Capillary 130 (*)    All other components within normal limits  BRAIN NATRIURETIC PEPTIDE  PROTIME-INR  TROPONIN I  TROPONIN I  TROPONIN I  HEMOGLOBIN A1C  I-STAT TROPOININ, ED    Imaging Review Dg Chest 2 View  06/22/2014   CLINICAL DATA:  Midsternal and left upper chest pain.  Ex-smoker.  EXAM: CHEST  2 VIEW  COMPARISON:   03/02/2014  FINDINGS: Lateral view degraded by patient arm position. Apical lordotic frontal film. Midline trachea. Mild cardiomegaly. Atherosclerosis in the transverse aorta. Mediastinal contours otherwise within normal limits. No pleural effusion or pneumothorax. No congestive failure. Mild left base scarring or atelectasis.  IMPRESSION: No acute cardiopulmonary disease.  Cardiomegaly without congestive failure.   Electronically Signed   By: Jeronimo Greaves M.D.   On: 06/22/2014 14:22     EKG Interpretation   Date/Time:  Sunday June 22 2014 12:58:28 EDT Ventricular Rate:  72 PR Interval:  188 QRS Duration: 78 QT Interval:  438 QTC Calculation: 479 R Axis:   6 Text Interpretation:  Normal sinus rhythm Low voltage QRS Septal infarct ,  age undetermined Abnormal ECG No significant change since last tracing  Confirmed by ALLEN  MD, ANTHONY ([id number]) on 06/22/2014 3:26:48 PM      MDM   Final diagnoses:  Acute kidney injury  Chest pain, unspecified chest pain type  Patient presents for constant chest pain since last night.  She took one nitroglycerin last night and this morning.  Her pain  is now 2/10.  She denies any associated symptoms or radiation of pain.  14:33 Nurse called lab regarding istat troponin since it was not back yet.  Her vitals are stable, troponin negative. BUN and creatinine are elevated. Due to multiple risk factors (heart score) and chest pain relieved temporarily by nitro, no recent stress test or echo, the patient will be admitted.   Dr. Freida Busman stoke to the hospitalist to admit the patient.     Catha Gosselin, PA-C 06/23/14 1116

## 2014-06-22 NOTE — ED Notes (Signed)
Attempted IV access and blood draw x2; no success. Patient reports IV team usually has to get IV.

## 2014-06-22 NOTE — ED Notes (Signed)
IV team at bedside 

## 2014-06-23 DIAGNOSIS — E1159 Type 2 diabetes mellitus with other circulatory complications: Secondary | ICD-10-CM | POA: Diagnosis not present

## 2014-06-23 DIAGNOSIS — N184 Chronic kidney disease, stage 4 (severe): Secondary | ICD-10-CM | POA: Diagnosis not present

## 2014-06-23 DIAGNOSIS — R0789 Other chest pain: Secondary | ICD-10-CM | POA: Diagnosis present

## 2014-06-23 DIAGNOSIS — I2510 Atherosclerotic heart disease of native coronary artery without angina pectoris: Secondary | ICD-10-CM | POA: Diagnosis not present

## 2014-06-23 LAB — TROPONIN I
Troponin I: <0.03 ng/mL (ref <0.031)
Troponin I: <0.03 ng/mL (ref <0.031)

## 2014-06-23 LAB — BASIC METABOLIC PANEL
Anion gap: 13 (ref 5–15)
BUN: 47 mg/dL — ABNORMAL HIGH (ref 6–23)
CO2: 22 mmol/L (ref 19–32)
Calcium: 9.2 mg/dL (ref 8.4–10.5)
Chloride: 103 mmol/L (ref 96–112)
Creatinine, Ser: 2.17 mg/dL — ABNORMAL HIGH (ref 0.50–1.10)
GFR, EST AFRICAN AMERICAN: 22 mL/min — AB (ref >90)
GFR, EST NON AFRICAN AMERICAN: 19 mL/min — AB (ref >90)
Glucose, Bld: 138 mg/dL — ABNORMAL HIGH (ref 70–99)
Potassium: 4.5 mmol/L (ref 3.5–5.1)
Sodium: 138 mmol/L (ref 135–145)

## 2014-06-23 LAB — CBC
HEMATOCRIT: 37.4 % (ref 36.0–46.0)
Hemoglobin: 11.4 g/dL — ABNORMAL LOW (ref 12.0–15.0)
MCH: 27.9 pg (ref 26.0–34.0)
MCHC: 30.5 g/dL (ref 30.0–36.0)
MCV: 91.7 fL (ref 78.0–100.0)
PLATELETS: 321 10*3/uL (ref 150–400)
RBC: 4.08 MIL/uL (ref 3.87–5.11)
RDW: 17 % — ABNORMAL HIGH (ref 11.5–15.5)
WBC: 6.3 10*3/uL (ref 4.0–10.5)

## 2014-06-23 LAB — GLUCOSE, CAPILLARY
Glucose-Capillary: 130 mg/dL — ABNORMAL HIGH (ref 70–99)
Glucose-Capillary: 147 mg/dL — ABNORMAL HIGH (ref 70–99)

## 2014-06-23 LAB — HEMOGLOBIN A1C
HEMOGLOBIN A1C: 6.4 % — AB (ref 4.8–5.6)
Mean Plasma Glucose: 137 mg/dL

## 2014-06-23 MED ORDER — OXYCODONE-ACETAMINOPHEN 5-325 MG PO TABS: 1.0000 | ORAL_TABLET | ORAL | Status: DC | PRN

## 2014-06-23 NOTE — Discharge Summary (Signed)
Physician Discharge Summary  [name] ZOX:[id number] DOB: [birth year] DOA: 06/22/2014  PCP: Georgianne Fick, MD  Admit date: 06/22/2014 Discharge date: 06/23/2014  Time spent: 25 minutes  Recommendations for Outpatient Follow-up:  D/c home with PCP follow up   Discharge Diagnoses:  Principal Problem:   Atypical chest pain  Active Problems:   CAD - moderate at cath July 2012 (medical Rx)   Diabetes mellitus type 2 with peripheral artery disease   Anemia of chronic disease   CKD (chronic kidney disease), stage IV   Discharge Condition: Fair  Diet recommendation: Heart healthy  Filed Weights   06/22/14 1759 06/23/14 0639  Weight: 53.9 kg (118 lb 13.3 oz) 52.91 kg (116 lb 10.3 oz)    History of present illness:  81 y.o. female with past medical history of CAD, last cath and 2012 showed 80% stenosis and recommended optimal medical management, patient has chronic CHF, hypertension and left BKA. Patient came to the hospital complaining of some chest pain,  constant, 8-9/10 intensity, sharp, radiating toward her left shoulder on the night prior to hospitalization.. When she woke up next morning the pain was more severe so she came into the hospital for further evaluation. In the ED first set of cardiac enzymes being negative, 12-lead EKG showed low voltage and no evidence of ischemia. Patient admitted to the hospital overnight to rule out ACS.  Hospital Course:  Chest pain Was reproducible and likely musculoskeletal. Serial troponin has been negative. She does not have further chest pain symptoms and stable on telemetry overnight. She is on optimal medical therapy including aspirin, Plavix, Imdur and Coreg. Patient can be discharged home with outpatient PCP follow.  Type 2 diabetes mellitus Resume home dose Lantus. Hemoglobin A1c ordered and results pending was seen and followed up as outpatient.  Anemia of chronic disease Stable.  CKD stage IV She had AV fistula for  dialysis access which was created twice but both clogged. Renal  function at baseline. Continue home dose Lasix daily.  Essential hypertension Amlodipine was held due to low normal blood pressure on admission but blood pressure elevated overnight.  so I have resumed her back  on amlodipine.    Procedures:  None  Consultations:  None  Discharge Exam: Filed Vitals:   06/23/14 1054  BP: 161/55  Pulse: 66  Temp: 98.3 F (36.8 C)  Resp: 18    General: Elderly female in no acute distress HEENT: No pallor, moist mucosa Chest: Clear to auscultation bilaterally CVS: Normal S1-S2, no murmurs, rubs or gallop GI: Soft, nondistended, nontender Musculoskeletal: Left BKA, no edema CNS: Alert and oriented   Discharge Instructions    Current Discharge Medication List    CONTINUE these medications which have NOT CHANGED   Details  acetaminophen (TYLENOL) 325 MG tablet Take 650 mg by mouth every 6 (six) hours as needed for mild pain.    albuterol-ipratropium (COMBIVENT) 18-103 MCG/ACT inhaler Inhale 2 puffs into the lungs every 4 (four) hours as needed for wheezing or shortness of breath.     allopurinol (ZYLOPRIM) 100 MG tablet Take 200 mg by mouth daily.     amLODipine (NORVASC) 5 MG tablet Take 5 mg by mouth daily. Refills: 0    aspirin EC 81 MG tablet Take 81 mg by mouth daily.    calcitRIOL (ROCALTROL) 0.25 MCG capsule Take 0.25 mcg by mouth every other day.     carvedilol (COREG) 6.25 MG tablet Take 6.25 mg by mouth 2 (two) times daily with a meal.  clopidogrel (PLAVIX) 75 MG tablet Take 75 mg by mouth daily.    feeding supplement, GLUCERNA SHAKE, (GLUCERNA SHAKE) LIQD Take 237 mLs by mouth daily as needed (PLease offer to pt if meal intake is less than 50%). Refills: 0    furosemide (LASIX) 80 MG tablet TAKE 1 TABLET (80 MG TOTAL) BY MOUTH 2 (TWO) TIMES DAILY. Qty: 60 tablet, Refills: 4    gabapentin (NEURONTIN) 300 MG capsule Take 300 mg by mouth 3 (three)  times daily.    insulin glargine (LANTUS) 100 UNIT/ML injection Inject 0.22 mLs (22 Units total) into the skin daily. Qty: 10 mL, Refills: 11    isosorbide mononitrate (IMDUR) 60 MG 24 hr tablet Take 60 mg by mouth daily.    levothyroxine (SYNTHROID, LEVOTHROID) 75 MCG tablet Take 75 mcg by mouth daily before breakfast.    nitroGLYCERIN (NITROSTAT) 0.4 MG SL tablet Place 0.4 mg under the tongue every 5 (five) minutes as needed for chest pain.     oxyCODONE-acetaminophen (ROXICET) 5-325 MG per tablet Take 1-2 tablets by mouth every 4 (four) hours as needed for severe pain. Qty: 20 tablet, Refills: 0    pravastatin (PRAVACHOL) 40 MG tablet Take 40 mg by mouth daily.    RENVELA 800 MG tablet Take 800 mg by mouth 3 (three) times daily with meals.     travoprost, benzalkonium, (TRAVATAN) 0.004 % ophthalmic solution Place 1 drop into both eyes at bedtime.     docusate sodium (COLACE) 100 MG capsule Take 100 mg by mouth daily as needed for mild constipation.    multivitamin (RENA-VIT) TABS tablet Take 1 tablet by mouth at bedtime. Refills: 0       Allergies  Allergen Reactions  . Aspirin Nausea And Vomiting    325 mg (adult strength) [name]    Follow-up Information    Follow up with [facility], [place]. Schedule an appointment as soon as possible for a visit in 2 weeks.   Specialty:  Internal Medicine   Contact information:   [address] [place] [address] [place] [address] [phone]        The results of significant diagnostics from this hospitalization (including imaging, microbiology, ancillary and laboratory) are listed below for reference.    Significant Diagnostic Studies: Dg Chest 2 View  06/22/2014   CLINICAL DATA:  Midsternal and left upper chest pain.  Ex-smoker.  EXAM: CHEST  2 VIEW  COMPARISON:  03/02/2014  FINDINGS: Lateral view degraded by patient arm position. Apical lordotic frontal film.  Midline trachea. Mild cardiomegaly. Atherosclerosis in the transverse aorta. Mediastinal contours otherwise within normal limits. No pleural effusion or pneumothorax. No congestive failure. Mild left base scarring or atelectasis.  IMPRESSION: No acute cardiopulmonary disease.  Cardiomegaly without congestive failure.   Electronically Signed   By: Jeronimo Greaves M.D.   On: 06/22/2014 14:22    Microbiology: No results found for this or any previous visit (from the past 240 hour(s)).   Labs: Basic Metabolic Panel:  Recent Labs Lab 06/22/14 1339 06/22/14 1907 06/23/14 0630  NA 138  --  138  K 4.0  --  4.5  CL 99  --  103  CO2 27  --  22  GLUCOSE 156*  --  138*  BUN 47*  --  47*  CREATININE 2.24* 2.12* 2.17*  CALCIUM 9.6  --  9.2   Liver Function Tests: No results for input(s): AST, ALT, ALKPHOS, BILITOT, PROT, ALBUMIN  in the last 168 hours. No results for input(s): LIPASE, AMYLASE in the last 168 hours. No results for input(s): AMMONIA in the last 168 hours. CBC:  Recent Labs Lab 06/22/14 1339 06/22/14 1907 06/23/14 0630  WBC 6.9 7.4 6.3  HGB 11.8* 10.8* 11.4*  HCT 37.1 34.7* 37.4  MCV 89.8 89.9 91.7  PLT 340 346 321   Cardiac Enzymes:  Recent Labs Lab 06/22/14 1907 06/23/14 0019 06/23/14 0630  TROPONINI <0.03 <0.03 <0.03   BNP: BNP (last 3 results)  Recent Labs  06/22/14 1339  BNP 23.9    ProBNP (last 3 results)  Recent Labs  08/15/13 1424 08/15/13 2015 03/02/14 1919  PROBNP 30760.0* 31926.0* 650.8*    CBG:  Recent Labs Lab 06/22/14 2123 06/23/14 0703 06/23/14 1106  GLUCAP 153* 130* 147*       Signed:  Robet Crutchfield  [facility] 06/23/2014, 12:41 PM

## 2014-06-23 NOTE — Progress Notes (Signed)
Pt cleaned and bathed; changed into her clean personal clothes with daughter in room after emesis x1; pt discharge education and instructions completed with pt and her children in room; all voices understanding denies any questions. Pt IV and telemetry removed; pt voices relief from zofran administered; pt discharge home with son and daughter to transport her home. Pt handed her prescription for Roxicet; pt transported off unit via wheelchair with family and belongings to the side. Arabella Merles Ary Rudnick RN.

## 2014-07-04 ENCOUNTER — Encounter (HOSPITAL_COMMUNITY): Payer: Medicare Other

## 2014-07-04 ENCOUNTER — Ambulatory Visit: Payer: Medicare Other | Admitting: Vascular Surgery

## 2014-07-04 ENCOUNTER — Other Ambulatory Visit (HOSPITAL_COMMUNITY): Payer: Medicare Other

## 2014-10-28 ENCOUNTER — Other Ambulatory Visit: Payer: Self-pay | Admitting: Internal Medicine

## 2014-10-28 NOTE — Telephone Encounter (Signed)
REFILL 

## 2014-11-24 ENCOUNTER — Other Ambulatory Visit: Payer: Self-pay | Admitting: Internal Medicine

## 2014-12-10 ENCOUNTER — Telehealth: Payer: Self-pay | Admitting: Internal Medicine

## 2014-12-11 NOTE — Telephone Encounter (Signed)
Close encounter 

## 2014-12-22 ENCOUNTER — Encounter: Payer: Self-pay | Admitting: Internal Medicine

## 2014-12-22 ENCOUNTER — Ambulatory Visit (INDEPENDENT_AMBULATORY_CARE_PROVIDER_SITE_OTHER): Payer: Medicare Other | Admitting: Internal Medicine

## 2014-12-22 VITALS — BP 138/70 | HR 65 | Ht <= 58 in

## 2014-12-22 DIAGNOSIS — I2510 Atherosclerotic heart disease of native coronary artery without angina pectoris: Secondary | ICD-10-CM

## 2014-12-22 DIAGNOSIS — I739 Peripheral vascular disease, unspecified: Secondary | ICD-10-CM

## 2014-12-22 DIAGNOSIS — E039 Hypothyroidism, unspecified: Secondary | ICD-10-CM

## 2014-12-22 DIAGNOSIS — N184 Chronic kidney disease, stage 4 (severe): Secondary | ICD-10-CM

## 2014-12-22 DIAGNOSIS — I519 Heart disease, unspecified: Secondary | ICD-10-CM

## 2014-12-22 DIAGNOSIS — I2583 Coronary atherosclerosis due to lipid rich plaque: Principal | ICD-10-CM

## 2014-12-22 DIAGNOSIS — I7092 Chronic total occlusion of artery of the extremities: Secondary | ICD-10-CM

## 2014-12-22 DIAGNOSIS — Z23 Encounter for immunization: Secondary | ICD-10-CM

## 2014-12-22 NOTE — Patient Instructions (Signed)
Your physician wants you to follow-up in: 6 months with Dr. Hilty. You will receive a reminder letter in the mail two months in advance. If you don't receive a letter, please call our office to schedule the follow-up appointment.    

## 2014-12-22 NOTE — Progress Notes (Signed)
OFFICE NOTE  Chief Complaint:  No complaints  Primary Care Physician: Georgianne Fick, MD  HPI:  [name] is a pleasant 81 year old female with an unfortunate history of heart failure in the past due to severe hypothyroidism. EF has subsequently normalized. However, she does have significant peripheral arterial disease; underwent a left BKA. When I saw her last on December 13, she was having problems with rest pain in her right foot, and discoloration of the sole of her foot and toes, with tenderness to palpation. I had prescribed her for pain medicine and had reviewed her lower extremity arteriogram with Dr. Allyson Sabal, and felt that there were very few options for revascularization. She returned today and has reported some improvement with reduction in pain, and there does appear to be less discoloration. However, pulses still remain very weak. Her family reports she's also had a mild cough and does seem a little bit more fatigued. She did not report any worsening shortness of breath or orthopnea but has had some difficulty transferring. At her last office visit, her JVP is elevated and there was some trace right lower extremity edema. She did have basilar crackles and appeared to be volume overloaded. I recommended increasing her diuretics and ordered a BMP and BNP. She was to return to the office in 2 weeks for reassessment. Unfortunately she never made that appointment - ultimately I recheck some laboratory work and she was noted to have progressive renal failure. She was ultimately brought to the ED with complaints of worsening SOB and Wheezing. EMS was called to her home and found her O2 sats to be 70% and she was placed on supplemental O2, and had mild improvement. She was found to have findings of CHF, however, her EF on echo was 65-70%. She has acute on chronic renal failure - her fistula could not be accessed and a temporary dialysis catheter was placed. She was dialyzed while in  the hospital and was discharged to rehabilitation. She has not had any followup dialysis and is supposed to be getting weekly test of her renal function being sent to her nephrologist. I do not see where she has an appointment with her nephrologist.  [name] returns today after a number of recent hospitalizations. In October she underwent fistula placement this was complicated by swelling of the arm and it was felt the fistula had to be closed. She now has little if any vascular access. She was then hospitalized for epiglottitis and treated with antibiotics. She continues to be wheezy. All of her wheezes upper airway. Weight is fortunately been stable she's denied any chest pain or worsening heart failure symptoms.   I saw [name] back in the office today. Overall she is without any complaints. She continues to have very poor renal function with a GFR between 15 and 20. She has no dialysis access. She's not had any end-of-life discussions and I briefly spoke with her today about that. She may wish to consider her options, especially DO NOT RESUSCITATE if she has no dialysis access. Given her age and comorbidities this is a very realistic thing to think about.  PMHx:  Past Medical History  Diagnosis Date  . Coronary artery disease     a. Cath 09/2010 - med rx.  Marland Kitchen TIA (transient ischemic attack)   . Hypertension   . Shingles   . Stroke (HCC)   . Anemia   . Cardiomyopathy, idiopathic (HCC) 02/08/2011  . Myocardial infarct (HCC)  x 3 unsure of years  . Irregular heartbeat   . Ulcer   . GERD (gastroesophageal reflux disease)   . Hx of transient ischemic attack (TIA)   . Memory loss   . Chronic diastolic CHF (congestive heart failure) (HCC)     Takes Lasix  . Gout     takes allopurinol  . Peripheral vascular disease (HCC)     a. s/p L BKA.  . Diabetes mellitus     type 2 NIDDM  . Chronic kidney disease (CKD), stage IV (severe) (HCC)   . Arthritis   . Hypothyroidism     (SEVERE)  Takes Levothryroxine  . Nonischemic cardiomyopathy (HCC)     EF now is 55%, reduced due to myxedema, which is improved.  . Dyslipidemia   . Peripheral neuropathy (HCC)   . H/O echocardiogram 09/06/11    Indication- nonIschemic Cardiomyopathy. EF = now greater than 55% with no regional wall motion abnormalities. Tthere is mild to moderate trisuspid regurgitayion and mild pulmonary hypertension with an RVSP of 35 mmHg as well as stage 1 diastolic dysfunction and mild to moderate LVH.  Marland Kitchen Abnormal nuclear stress test 06/01/09    Demonstrated a new area of infarct scar, peri-infarct ischemia seen in the inferolateral territory. EF eas normal at 70% with mild hypocontractility at the apex, distal inferolateral wall.    Past Surgical History  Procedure Laterality Date  . Back surgery      [facility]  . Eye surgery      Left eye surgery; cataract removal  . Left bka  07/05/2011  . Av fistula placement    . Amputation  07/05/2011    Procedure: AMPUTATION BELOW KNEE;  Surgeon: Chuck Hint, MD;  Location: [facility] OR;  Service: Vascular;  Laterality: Left;  . Angioplasty  1988  . Cardiac catheterization  09/28/07    Demonstrated multiple sequential lesions around 40 to 30% in the RCA territory.  . Left lower extremity venous duplex Left 06/27/11    Summary: No evidence of DVT involving the left lower extremity and right common femoral vein.   . Lower extremity arterial evaluation  06/27/11    SUMMARY: Right: ABI not ascertained due to false elevation in BP secondary to calcification (posterior tibial artery is non compressible). Left: ABI indicates moderate reduction in arterial flow. Bilateral: Great toe PPG waveforms indicate adequate perfusion. Great toe pressures not obtained due to patient's movements secondary to pain.  . Duplex doppler  05/10/11    LE arterial dopplers demonstrate bilaterally reduced ABIs of 0.91 on right & 0.56 on left. She does report some decreased pain on the left, &  there's moderate mixed-density plaque in the right SFA w/50 to 69% reduction. There's a 69% reduction in the left SFA & does appear to be occlusive disease of left posterior tibial artery. Right posterior dorsalis pedis artery demonstrates occlusive disease  . Insertion of dialysis catheter N/A 01/06/2013    Procedure: INSERTION OF DIALYSIS CATHETER;  Surgeon: Larina Earthly, MD;  Location: [facility] OR;  Service: Vascular;  Laterality: N/A;  . Av fistula placement Right 12/24/2013    Procedure: INSERTION OF ARTERIOVENOUS (AV) GORE-TEX GRAFT ARM;  Surgeon: Chuck Hint, MD;  Location: [facility] OR;  Service: Vascular;  Laterality: Right;  . Lower extremity angiogram N/A 10/31/2011    Procedure: LOWER EXTREMITY ANGIOGRAM;  Surgeon: Chuck Hint, MD;  Location: [facility] CATH LAB;  Service: Cardiovascular;  Laterality: N/A;  . Shuntogram N/A 03/17/2014  Procedure: SHUNTOGRAM;  Surgeon: Fransisco Hertz, MD;  Location: [facility] CATH LAB;  Service: Cardiovascular;  Laterality: N/A;  . Ligation arteriovenous gortex graft Right 03/25/2014    Procedure: LIGATION ARTERIOVENOUS GORTEX GRAFT;  Surgeon: Chuck Hint, MD;  Location: [facility] OR;  Service: Vascular;  Laterality: Right;  . Esophagogastroduodenoscopy N/A 04/15/2014    Procedure: ESOPHAGOGASTRODUODENOSCOPY (EGD);  Surgeon: Rachael Fee, MD;  Location: [facility] ENDOSCOPY;  Service: Endoscopy;  Laterality: N/A;    FAMHx:  Family History  Problem Relation Age of Onset  . Cancer Sister     STOMACH  . Diabetes Sister   . Cancer Brother     BONE  . Diabetes Brother   . Anesthesia problems Neg Hx   . Hypotension Neg Hx   . Malignant hyperthermia Neg Hx   . Pseudochol deficiency Neg Hx   . Hyperlipidemia Daughter   . Hypertension Daughter   . Heart disease Daughter     before age 13  . Kidney disease Daughter   . Other Daughter     varicose veins  . Diabetes Daughter   . Heart disease Son     before age 85  . Hyperlipidemia Son   . Hypertension Son     . Heart attack Son   . Heart attack Daughter   . Heart disease Daughter     Before age 29    SOCHx:   reports that she quit smoking about 28 years ago. She has quit using smokeless tobacco. She reports that she does not drink alcohol or use illicit drugs.  ALLERGIES:  Allergies  Allergen Reactions  . Aspirin Nausea And Vomiting    325 mg (adult strength) [name]     ROS: A comprehensive review of systems was negative.  HOME MEDS: Current Outpatient Prescriptions  Medication Sig Dispense Refill  . acetaminophen (TYLENOL) 325 MG tablet Take 650 mg by mouth every 6 (six) hours as needed for mild pain.    Marland Kitchen albuterol-ipratropium (COMBIVENT) 18-103 MCG/ACT inhaler Inhale 2 puffs into the lungs every 4 (four) hours as needed for wheezing or shortness of breath.     . allopurinol (ZYLOPRIM) 100 MG tablet Take 200 mg by mouth daily.     Marland Kitchen amLODipine (NORVASC) 5 MG tablet Take 5 mg by mouth daily.  0  . aspirin EC 81 MG tablet Take 81 mg by mouth daily.    . calcitRIOL (ROCALTROL) 0.25 MCG capsule Take 0.25 mcg by mouth every other day.     . carvedilol (COREG) 6.25 MG tablet TAKE 1 TABLET (6.25 MG TOTAL) BY MOUTH 2 (TWO) TIMES DAILY WITH A MEAL. NEED OV. 60 tablet 3  . clopidogrel (PLAVIX) 75 MG tablet Take 75 mg by mouth daily.    Marland Kitchen docusate sodium (COLACE) 100 MG capsule Take 100 mg by mouth daily as needed for mild constipation.    . feeding supplement, GLUCERNA SHAKE, (GLUCERNA SHAKE) LIQD Take 237 mLs by mouth daily as needed (PLease offer to pt if meal intake is less than 50%).  0  . furosemide (LASIX) 80 MG tablet TAKE 1 TABLET (80 MG TOTAL) BY MOUTH 2 (TWO) TIMES DAILY. 60 tablet 4  . gabapentin (NEURONTIN) 300 MG capsule Take 300 mg by mouth 3 (three) times daily.    Marland Kitchen HYDROcodone-acetaminophen (NORCO/VICODIN) 5-325 MG tablet Take 1 tablet by mouth 2 (two) times daily as needed.  0  . insulin glargine (LANTUS) 100  UNIT/ML injection Inject 0.22 mLs (22 Units total) into the skin daily. 10 mL 11  . isosorbide mononitrate (IMDUR) 60 MG 24 hr tablet Take 60 mg by mouth daily.    Marland Kitchen levothyroxine (SYNTHROID, LEVOTHROID) 75 MCG tablet Take 75 mcg by mouth daily before breakfast.    . multivitamin (RENA-VIT) TABS tablet Take 1 tablet by mouth at bedtime.  0  . nitroGLYCERIN (NITROSTAT) 0.4 MG SL tablet Place 0.4 mg under the tongue every 5 (five) minutes as needed for chest pain.     Marland Kitchen oxyCODONE-acetaminophen (ROXICET) 5-325 MG per tablet Take 1 tablet by mouth every 4 (four) hours as needed for severe pain. 12 tablet 0  . pravastatin (PRAVACHOL) 40 MG tablet Take 40 mg by mouth daily.    Marland Kitchen RENVELA 800 MG tablet Take 800 mg by mouth 3 (three) times daily with meals.     . travoprost, benzalkonium, (TRAVATAN) 0.004 % ophthalmic solution Place 1 drop into both eyes at bedtime.      No current facility-administered medications for this visit.    LABS/IMAGING: No results found for this or any previous visit (from the past 48 hour(s)). No results found.  VITALS: BP 138/70 mmHg  Pulse 65  Ht  (1.397 m)  Wt   EXAM: General appearance: alert and no distress Neck: JVD flat, no carotid bruits, upper airway wheeze Lungs: diminished breath sounds bilaterally, faint basilar crackles Heart: regular rate and rhythm Abdomen: soft, mildly protuberant Extremities: no edema on the right leg, left is BKA Pulses: faint pulse in the right foot Skin: Skin color, texture, turgor normal. No rashes or lesions Neurologic: Mental status: Alert, oriented, thought content appropriate  EKG: Normal sinus rhythm at 65, possible left atrial enlargement, inferolateral T-wave changes  ASSESSMENT: 1. Acute on chronic systolic heart failure exacerbation 2. Acute on chronic kidney disease stage V-hemodialysis catheter in place 3. PAD with left BKA 4. History of severe hypothyroidism with cardiomyopathy, EF improved to  65=70% 5. ESRD, not on HD - fistula in place  PLAN: 1.   [name] has a fairly poor quality of life.. Renal function seems to be stable however she has numerous medical comorbidities. EF seems to be stable. She has end-stage renal disease with no dialysis access. She is not considered end-of-life issues and may need addressed this with her primary care provider or nephrologist. From a cardiovascular standpoint she needs to stay on aspirin and Plavix in addition medications to treat her history of heart failure in the past.  I will see her back in 6 months.  Chrystie Nose, MD, [facility] Attending Cardiologist [facility]  Chrystie Nose 12/22/2014, 3:19 PM

## 2015-01-06 ENCOUNTER — Other Ambulatory Visit: Payer: Self-pay | Admitting: Internal Medicine

## 2015-05-04 ENCOUNTER — Other Ambulatory Visit: Payer: Self-pay | Admitting: Internal Medicine

## 2015-05-04 NOTE — Telephone Encounter (Signed)
Rx request sent to pharmacy.  

## 2015-06-12 ENCOUNTER — Emergency Department (HOSPITAL_COMMUNITY)
Admission: EM | Admit: 2015-06-12 | Discharge: 2015-06-12 | Disposition: A | Discharge status: Home / Self Care | Payer: Medicare Other | Attending: Emergency Medicine | Admitting: Emergency Medicine

## 2015-06-12 ENCOUNTER — Emergency Department (HOSPITAL_COMMUNITY): Payer: Medicare Other

## 2015-06-12 ENCOUNTER — Encounter (HOSPITAL_COMMUNITY): Payer: Self-pay

## 2015-06-12 DIAGNOSIS — S92321A Displaced fracture of second metatarsal bone, right foot, initial encounter for closed fracture: Secondary | ICD-10-CM | POA: Insufficient documentation

## 2015-06-12 DIAGNOSIS — M109 Gout, unspecified: Secondary | ICD-10-CM | POA: Insufficient documentation

## 2015-06-12 DIAGNOSIS — G629 Polyneuropathy, unspecified: Secondary | ICD-10-CM | POA: Insufficient documentation

## 2015-06-12 DIAGNOSIS — I5032 Chronic diastolic (congestive) heart failure: Secondary | ICD-10-CM | POA: Insufficient documentation

## 2015-06-12 DIAGNOSIS — Z794 Long term (current) use of insulin: Secondary | ICD-10-CM | POA: Diagnosis not present

## 2015-06-12 DIAGNOSIS — Z87890 Personal history of sex reassignment: Secondary | ICD-10-CM | POA: Diagnosis not present

## 2015-06-12 DIAGNOSIS — Z8673 Personal history of transient ischemic attack (TIA), and cerebral infarction without residual deficits: Secondary | ICD-10-CM | POA: Insufficient documentation

## 2015-06-12 DIAGNOSIS — W06XXXA Fall from bed, initial encounter: Secondary | ICD-10-CM | POA: Insufficient documentation

## 2015-06-12 DIAGNOSIS — I252 Old myocardial infarction: Secondary | ICD-10-CM | POA: Insufficient documentation

## 2015-06-12 DIAGNOSIS — N184 Chronic kidney disease, stage 4 (severe): Secondary | ICD-10-CM | POA: Diagnosis not present

## 2015-06-12 DIAGNOSIS — I2510 Atherosclerotic heart disease of native coronary artery without angina pectoris: Secondary | ICD-10-CM | POA: Insufficient documentation

## 2015-06-12 DIAGNOSIS — S92301A Fracture of unspecified metatarsal bone(s), right foot, initial encounter for closed fracture: Secondary | ICD-10-CM

## 2015-06-12 DIAGNOSIS — Y9389 Activity, other specified: Secondary | ICD-10-CM | POA: Insufficient documentation

## 2015-06-12 DIAGNOSIS — Z862 Personal history of diseases of the blood and blood-forming organs and certain disorders involving the immune mechanism: Secondary | ICD-10-CM | POA: Diagnosis not present

## 2015-06-12 DIAGNOSIS — I129 Hypertensive chronic kidney disease with stage 1 through stage 4 chronic kidney disease, or unspecified chronic kidney disease: Secondary | ICD-10-CM | POA: Insufficient documentation

## 2015-06-12 DIAGNOSIS — Z7901 Long term (current) use of anticoagulants: Secondary | ICD-10-CM | POA: Insufficient documentation

## 2015-06-12 DIAGNOSIS — Y9289 Other specified places as the place of occurrence of the external cause: Secondary | ICD-10-CM | POA: Diagnosis not present

## 2015-06-12 DIAGNOSIS — E119 Type 2 diabetes mellitus without complications: Secondary | ICD-10-CM | POA: Diagnosis not present

## 2015-06-12 DIAGNOSIS — Z7982 Long term (current) use of aspirin: Secondary | ICD-10-CM | POA: Insufficient documentation

## 2015-06-12 DIAGNOSIS — Z9889 Other specified postprocedural states: Secondary | ICD-10-CM | POA: Insufficient documentation

## 2015-06-12 DIAGNOSIS — Z9861 Coronary angioplasty status: Secondary | ICD-10-CM | POA: Diagnosis not present

## 2015-06-12 DIAGNOSIS — E785 Hyperlipidemia, unspecified: Secondary | ICD-10-CM | POA: Insufficient documentation

## 2015-06-12 DIAGNOSIS — E039 Hypothyroidism, unspecified: Secondary | ICD-10-CM | POA: Insufficient documentation

## 2015-06-12 DIAGNOSIS — Z79899 Other long term (current) drug therapy: Secondary | ICD-10-CM | POA: Insufficient documentation

## 2015-06-12 DIAGNOSIS — M1990 Unspecified osteoarthritis, unspecified site: Secondary | ICD-10-CM | POA: Insufficient documentation

## 2015-06-12 DIAGNOSIS — Z8619 Personal history of other infectious and parasitic diseases: Secondary | ICD-10-CM | POA: Insufficient documentation

## 2015-06-12 DIAGNOSIS — S99921A Unspecified injury of right foot, initial encounter: Secondary | ICD-10-CM | POA: Diagnosis present

## 2015-06-12 DIAGNOSIS — Y998 Other external cause status: Secondary | ICD-10-CM | POA: Diagnosis not present

## 2015-06-12 LAB — CBG MONITORING, ED: Glucose-Capillary: 285 mg/dL — ABNORMAL HIGH (ref 65–99)

## 2015-06-12 MED ORDER — HYDROCODONE-ACETAMINOPHEN 5-325 MG PO TABS: 1.0000 | ORAL_TABLET | Freq: Four times a day (QID) | ORAL | Status: DC | PRN

## 2015-06-12 NOTE — ED Notes (Signed)
ptar present for transport. 

## 2015-06-12 NOTE — ED Notes (Signed)
Copywriter, advertising.

## 2015-06-12 NOTE — Care Management (Signed)
ED CM discussed patient with Dr. Alvino Chapel EDP patient may be able to bear light weight in order to transfer to bedside commode and wheel chair. Patient lives at home with family. CM met with patient and family to discuss discharge plan. Discussed HH services RN PT/OT/ HHA/ SW for any possible placement needs.,family also requesting a hoyer lift for lifting assistance.  patient and family agrees on discharge plan. Offered choice patient and family selected [facility], verified contact information.  Referral faxed to [facility] [phone].fax confirmation received. Teach back done with patient and family, verbalized understanding. CM also explained that nurse from [facility] will contact them by phone 24-48 hours post discharged, No further questions or concerns verbalized. Updated Dr. Alvino Chapel and Junie Panning on discharge plan. No further CM needs identified.

## 2015-06-12 NOTE — Discharge Instructions (Signed)
Metatarsal Fracture A metatarsal fracture is a break in a metatarsal bone. Metatarsal bones connect your toe bones to your ankle bones. CAUSES This type of fracture may be caused by:  A sudden twisting of your foot.  A fall onto your foot.  Overuse or repetitive exercise. RISK FACTORS This condition is more likely to develop in people who:  Play contact sports.  Have a bone disease.  Have a low calcium level. SYMPTOMS Symptoms of this condition include:  Pain that is worse when walking or standing.  Pain when pressing on the foot or moving the toes.  Swelling.  Bruising on the top or bottom of the foot.  A foot that appears shorter than the other one. DIAGNOSIS This condition is diagnosed with a physical exam. You may also have imaging tests, such as:  X-rays.  A CT scan.  MRI. TREATMENT Treatment for this condition depends on its severity and whether a bone has moved out of place. Treatment may involve:  Rest.  Wearing foot support such as a cast, splint, or boot for several weeks.  Using crutches.  Surgery to move bones back into the right position. Surgery is usually needed if there are many pieces of broken bone or bones that are very out of place (displaced fracture).  Physical therapy. This may be needed to help you regain full movement and strength in your foot. You will need to return to your health care provider to have X-rays taken until your bones heal. Your health care provider will look at the X-rays to make sure that your foot is healing well. HOME CARE INSTRUCTIONS  If You Have a Cast:  Do not stick anything inside the cast to scratch your skin. Doing that increases your risk of infection.  Check the skin around the cast every day. Report any concerns to your health care provider. You may put lotion on dry skin around the edges of the cast. Do not apply lotion to the skin underneath the cast.  Keep the cast clean and dry. If You Have a Splint  or a Supportive Boot:  Wear it as directed by your health care provider. Remove it only as directed by your health care provider.  Loosen it if your toes become numb and tingle, or if they turn cold and blue.  Keep it clean and dry. Bathing  Do not take baths, swim, or use a hot tub until your health care provider approves. Ask your health care provider if you can take showers. You may only be allowed to take sponge baths for bathing.  If your health care provider approves bathing and showering, cover the cast or splint with a watertight plastic bag to protect it from water. Do not let the cast or splint get wet. Managing Pain, Stiffness, and Swelling  If directed, apply ice to the injured area (if you have a splint, not a cast).  Put ice in a plastic bag.  Place a towel between your skin and the bag.  Leave the ice on for 20 minutes, 2-3 times per day.  Move your toes often to avoid stiffness and to lessen swelling.  Raise (elevate) the injured area above the level of your heart while you are sitting or lying down. Driving  Do not drive or operate heavy machinery while taking pain medicine.  Do not drive while wearing foot support on a foot that you use for driving. Activity  Return to your normal activities as directed by your health care   provider. Ask your health care provider what activities are safe for you.  Perform exercises as directed by your health care provider or physical therapist. Safety  Do not use the injured foot to support your body weight until your health care provider says that you can. Use crutches as directed by your health care provider. General Instructions  Do not put pressure on any part of the cast or splint until it is fully hardened. This may take several hours.  Do not use any tobacco products, including cigarettes, chewing tobacco, or e-cigarettes. Tobacco can delay bone healing. If you need help quitting, ask your health care  provider.  Take medicines only as directed by your health care provider.  Keep all follow-up visits as directed by your health care provider. This is important. SEEK MEDICAL CARE IF:  You have a fever.  Your cast, splint, or boot is too loose or too tight.  Your cast, splint, or boot is damaged.  Your pain medicine is not helping.  You have pain, tingling, or numbness in your foot that is not going away. SEEK IMMEDIATE MEDICAL CARE IF:  You have severe pain.  You have tingling or numbness in your foot that is getting worse.  Your foot feels cold or becomes numb.  Your foot changes color.   This information is not intended to replace advice given to you by your health care provider. Make sure you discuss any questions you have with your health care provider.   Document Released: 11/27/2001 Document Revised: 07/22/2014 Document Reviewed: 01/01/2014 [employer] Interactive Patient Education 2016 [employer]  

## 2015-06-12 NOTE — ED Provider Notes (Signed)
CSN: [id number]     Arrival date & time 06/12/15  1549 History   First MD Initiated Contact with Patient 06/12/15 1608     Chief Complaint  Patient presents with  . Ankle Pain     Patient is a 82 y.o. female presenting with ankle pain. The history is provided by the patient.  Ankle Pain Associated symptoms: no back pain   Patient presents with pain in her right foot. States that she fell trying to get from her bed to the commode last night. No other pain deformity. Denies loss conscious. States she did not hit her head. States she had missed her insulin yesterday but was given to her today.  Past Medical History  Diagnosis Date  . Coronary artery disease     a. Cath 09/2010 - med rx.  Marland Kitchen TIA (transient ischemic attack)   . Hypertension   . Shingles   . Stroke (HCC)   . Anemia   . Cardiomyopathy, idiopathic (HCC) 02/08/2011  . Myocardial infarct (HCC)     x 3 unsure of years  . Irregular heartbeat   . Ulcer   . GERD (gastroesophageal reflux disease)   . Hx of transient ischemic attack (TIA)   . Memory loss   . Chronic diastolic CHF (congestive heart failure) (HCC)     Takes Lasix  . Gout     takes allopurinol  . Peripheral vascular disease (HCC)     a. s/p L BKA.  . Diabetes mellitus     type 2 NIDDM  . Chronic kidney disease (CKD), stage IV (severe) (HCC)   . Arthritis   . Hypothyroidism     (SEVERE) Takes Levothryroxine  . Nonischemic cardiomyopathy (HCC)     EF now is 55%, reduced due to myxedema, which is improved.  . Dyslipidemia   . Peripheral neuropathy (HCC)   . H/O echocardiogram 09/06/11    Indication- nonIschemic Cardiomyopathy. EF = now greater than 55% with no regional wall motion abnormalities. Tthere is mild to moderate trisuspid regurgitayion and mild pulmonary hypertension with an RVSP of 35 mmHg as well as stage 1 diastolic dysfunction and mild to moderate LVH.  Marland Kitchen Abnormal nuclear stress test 06/01/09    Demonstrated a new area of infarct scar,  peri-infarct ischemia seen in the inferolateral territory. EF eas normal at 70% with mild hypocontractility at the apex, distal inferolateral wall.   Past Surgical History  Procedure Laterality Date  . Back surgery      [facility]  . Eye surgery      Left eye surgery; cataract removal  . Left bka  07/05/2011  . Av fistula placement    . Amputation  07/05/2011    Procedure: AMPUTATION BELOW KNEE;  Surgeon: Chuck Hint, MD;  Location: [facility] OR;  Service: Vascular;  Laterality: Left;  . Angioplasty  1988  . Cardiac catheterization  09/28/07    Demonstrated multiple sequential lesions around 40 to 30% in the RCA territory.  . Left lower extremity venous duplex Left 06/27/11    Summary: No evidence of DVT involving the left lower extremity and right common femoral vein.   . Lower extremity arterial evaluation  06/27/11    SUMMARY: Right: ABI not ascertained due to false elevation in BP secondary to calcification (posterior tibial artery is non compressible). Left: ABI indicates moderate reduction in arterial flow. Bilateral: Great toe PPG waveforms indicate adequate perfusion. Great toe pressures not obtained due to patient's movements secondary to pain.  Marland Kitchen  Duplex doppler  05/10/11    LE arterial dopplers demonstrate bilaterally reduced ABIs of 0.91 on right & 0.56 on left. She does report some decreased pain on the left, & there's moderate mixed-density plaque in the right SFA w/50 to 69% reduction. There's a 69% reduction in the left SFA & does appear to be occlusive disease of left posterior tibial artery. Right posterior dorsalis pedis artery demonstrates occlusive disease  . Insertion of dialysis catheter N/A 01/06/2013    Procedure: INSERTION OF DIALYSIS CATHETER;  Surgeon: Larina Earthly, MD;  Location: [facility] OR;  Service: Vascular;  Laterality: N/A;  . Av fistula placement Right 12/24/2013    Procedure: INSERTION OF ARTERIOVENOUS (AV) GORE-TEX GRAFT ARM;  Surgeon: Chuck Hint,  MD;  Location: [facility] OR;  Service: Vascular;  Laterality: Right;  . Lower extremity angiogram N/A 10/31/2011    Procedure: LOWER EXTREMITY ANGIOGRAM;  Surgeon: Chuck Hint, MD;  Location: [facility] CATH LAB;  Service: Cardiovascular;  Laterality: N/A;  . Shuntogram N/A 03/17/2014    Procedure: Betsey Amen;  Surgeon: Fransisco Hertz, MD;  Location: [facility] CATH LAB;  Service: Cardiovascular;  Laterality: N/A;  . Ligation arteriovenous gortex graft Right 03/25/2014    Procedure: LIGATION ARTERIOVENOUS GORTEX GRAFT;  Surgeon: Chuck Hint, MD;  Location: [facility] OR;  Service: Vascular;  Laterality: Right;  . Esophagogastroduodenoscopy N/A 04/15/2014    Procedure: ESOPHAGOGASTRODUODENOSCOPY (EGD);  Surgeon: Rachael Fee, MD;  Location: [facility] ENDOSCOPY;  Service: Endoscopy;  Laterality: N/A;   Family History  Problem Relation Age of Onset  . Cancer Sister     STOMACH  . Diabetes Sister   . Cancer Brother     BONE  . Diabetes Brother   . Anesthesia problems Neg Hx   . Hypotension Neg Hx   . Malignant hyperthermia Neg Hx   . Pseudochol deficiency Neg Hx   . Hyperlipidemia Daughter   . Hypertension Daughter   . Heart disease Daughter     before age 49  . Kidney disease Daughter   . Other Daughter     varicose veins  . Diabetes Daughter   . Heart disease Son     before age 55  . Hyperlipidemia Son   . Hypertension Son   . Heart attack Son   . Heart attack Daughter   . Heart disease Daughter     Before age 43   Social History  Substance Use Topics  . Smoking status: Former Smoker -- 0.50 packs/day for 40 years    Quit date: 07/05/1986  . Smokeless tobacco: Former Neurosurgeon     Comment: quit smoking 1988  . Alcohol Use: No   OB History    No data available     Review of Systems  Constitutional: Negative for appetite change.  Respiratory: Negative for shortness of breath.   Cardiovascular: Negative for leg swelling.  Gastrointestinal: Negative for abdominal pain.  Genitourinary:  Negative for frequency and flank pain.  Musculoskeletal: Negative for back pain.       Right ankle pain/foot pain.  Skin: Positive for wound. Negative for pallor.  Neurological: Negative for speech difficulty and headaches.      Allergies  Aspirin  Home Medications   Prior to Admission medications   Medication Sig Start Date End Date Taking? Authorizing Provider  acetaminophen (TYLENOL) 325 MG tablet Take 650 mg by mouth every 6 (six) hours as needed for mild pain.   Yes Historical Provider, MD  allopurinol (ZYLOPRIM) 100 MG tablet Take  200 mg by mouth daily.    Yes Historical Provider, MD  amLODipine (NORVASC) 5 MG tablet Take 5 mg by mouth daily. 06/04/14  Yes Historical Provider, MD  aspirin EC 81 MG tablet Take 81 mg by mouth daily.   Yes Historical Provider, MD  calcitRIOL (ROCALTROL) 0.25 MCG capsule Take 0.25 mcg by mouth every other day.  11/04/12  Yes Historical Provider, MD  carvedilol (COREG) 6.25 MG tablet TAKE 1 TABLET (6.25 MG TOTAL) BY MOUTH 2 (TWO) TIMES DAILY WITH A MEAL. NEED OV. 05/04/15  Yes Chrystie Nose, MD  clopidogrel (PLAVIX) 75 MG tablet Take 75 mg by mouth every evening.    Yes Historical Provider, MD  furosemide (LASIX) 80 MG tablet TAKE 1 TABLET (80 MG TOTAL) BY MOUTH 2 (TWO) TIMES DAILY. 01/07/15  Yes Chrystie Nose, MD  gabapentin (NEURONTIN) 300 MG capsule Take 300 mg by mouth 3 (three) times daily.   Yes Historical Provider, MD  insulin glargine (LANTUS) 100 UNIT/ML injection Inject 0.22 mLs (22 Units total) into the skin daily. Patient taking differently: Inject 24 Units into the skin daily.  08/23/13  Yes [name], MD  isosorbide mononitrate (IMDUR) 60 MG 24 hr tablet Take 60 mg by mouth daily.   Yes Historical Provider, MD  levothyroxine (SYNTHROID, LEVOTHROID) 75 MCG tablet Take 75 mcg by mouth daily before breakfast.   Yes Historical Provider, MD  nitroGLYCERIN (NITROSTAT) 0.4 MG SL tablet Place 0.4 mg under the tongue every 5 (five) minutes as  needed for chest pain.    Yes Historical Provider, MD  pravastatin (PRAVACHOL) 40 MG tablet Take 40 mg by mouth every evening.    Yes Historical Provider, MD  RENVELA 800 MG tablet Take 800 mg by mouth 3 (three) times daily with meals.  07/04/13  Yes Historical Provider, MD  albuterol-ipratropium (COMBIVENT) 18-103 MCG/ACT inhaler Inhale 2 puffs into the lungs every 4 (four) hours as needed for wheezing or shortness of breath.     Historical Provider, MD  docusate sodium (COLACE) 100 MG capsule Take 100 mg by mouth daily as needed for mild constipation.    Historical Provider, MD  feeding supplement, GLUCERNA SHAKE, (GLUCERNA SHAKE) LIQD Take 237 mLs by mouth daily as needed (PLease offer to pt if meal intake is less than 50%). 08/23/13   [name], MD  HYDROcodone-acetaminophen (NORCO/VICODIN) 5-325 MG tablet Take 1-2 tablets by mouth every 6 (six) hours as needed. 06/12/15   Benjiman Core, MD  multivitamin (RENA-VIT) TABS tablet Take 1 tablet by mouth at bedtime. 01/15/13   Osvaldo Shipper, MD  oxyCODONE-acetaminophen (ROXICET) 5-325 MG per tablet Take 1 tablet by mouth every 4 (four) hours as needed for severe pain. 06/23/14   Nishant Dhungel, MD  travoprost, benzalkonium, (TRAVATAN) 0.004 % ophthalmic solution Place 1 drop into both eyes at bedtime.     Historical Provider, MD   BP 194/79 mmHg  Pulse 75  Temp(Src) 98.4 F (36.9 C) (Oral)  Resp 18  Ht 4\' 10"  (1.473 m)  Wt 120 lb (54.432 kg)  BMI 25.09 kg/m2  SpO2 98% Physical Exam  Constitutional: She appears well-developed.  HENT:  Head: Atraumatic.  Eyes: Pupils are equal, round, and reactive to light.  Neck: Neck supple.  Cardiovascular: Normal rate and regular rhythm.   Pulmonary/Chest: Effort normal. She exhibits no tenderness.  Abdominal: Soft. There is no tenderness.  Musculoskeletal: She exhibits tenderness.  Tenderness over right midfoot and forefoot. Small superficial abrasion/laceration to the medial aspect of the  right  great toe. Left-sided below the knee amputation.  Skin: Skin is warm.    ED Course  Procedures (including critical care time) Labs Review Labs Reviewed  CBG MONITORING, ED - Abnormal; Notable for the following:    Glucose-Capillary 285 (*)    All other components within normal limits    Imaging Review Dg Foot Complete Right  06/12/2015  CLINICAL DATA:  Fall yesterday morning. Right foot pain. Initial encounter. EXAM: RIGHT FOOT COMPLETE - 3+ VIEW COMPARISON:  01/13/2006 FINDINGS: The bones are diffusely osteopenic. There is an oblique fracture through the distal shaft of the second metatarsal which demonstrates minimal medial displacement. There is no dislocation. There is soft tissue swelling in the forefoot. Vascular calcifications are noted. IMPRESSION: Minimally displaced second metatarsal fracture parent Electronically Signed   By: Sebastian Ache M.D.   On: 06/12/2015 17:16   Dg Hip Unilat With Pelvis 2-3 Views Left  06/12/2015  CLINICAL DATA:  Fall yesterday morning, left hip pain EXAM: DG HIP (WITH OR WITHOUT PELVIS) 2-3V LEFT COMPARISON:  None. FINDINGS: Hips are located. No evidence of pelvic fracture or sacral fracture. Dedicated view of the LEFT hip demonstrates no femoral neck fracture. IMPRESSION: No radiographic evidence of LEFT hip fracture. Electronically Signed   By: Genevive Bi M.D.   On: 06/12/2015 17:16   I have personally reviewed and evaluated these images and lab results as part of my medical decision-making.   EKG Interpretation None      MDM   Final diagnoses:  Metatarsal fracture, right, closed, initial encounter    Patient with fall. Right foot fracture. Given postop shoe in order for follow-up as needed. Patient needs some extra help at home. She is non ambulatory at baseline. Case management is seen the patient. Home health arranged as was a lift. Will discharge home.    Benjiman Core, MD 06/12/15 [phone]

## 2015-06-12 NOTE — ED Notes (Signed)
[facility] CALLED FOR TRANSPORT. 

## 2015-06-12 NOTE — ED Notes (Signed)
Ortho paged. 

## 2015-06-12 NOTE — ED Notes (Signed)
Pt brought in EMS for right ankle and hip pain after a fall yesterday morning.  Pt reports she slipped trying to transfer from bed to bedside commode.  Pt denies hitting head or LOC.  Pt CBG was 400 upon EMS arrival.  PT had not taken insulin at that time but took 24 units Lantus PTA.  Pt in NAD and A&OX4

## 2015-06-12 NOTE — Care Management (Addendum)
CM met with patient and family at bedside, patient presented to [facility] ED s/p fall with foot fracture. Patient is a left BKA. Patient has w/c and bedside commode at home., but family states they will be unable to assist with lifting for toileting and other ADL's. Patient and family care goals is SNF for rehab. Consult placed for CSW.

## 2015-06-12 NOTE — ED Notes (Signed)
Education provided for post op shoe.

## 2015-09-02 ENCOUNTER — Encounter (HOSPITAL_COMMUNITY): Payer: Self-pay

## 2015-09-02 ENCOUNTER — Emergency Department (HOSPITAL_COMMUNITY)
Admission: EM | Admit: 2015-09-02 | Discharge: 2015-09-02 | Disposition: A | Discharge status: Home / Self Care | Payer: Medicare Other | Attending: Emergency Medicine | Admitting: Emergency Medicine

## 2015-09-02 DIAGNOSIS — R1011 Right upper quadrant pain: Secondary | ICD-10-CM | POA: Diagnosis present

## 2015-09-02 DIAGNOSIS — E039 Hypothyroidism, unspecified: Secondary | ICD-10-CM | POA: Insufficient documentation

## 2015-09-02 DIAGNOSIS — E119 Type 2 diabetes mellitus without complications: Secondary | ICD-10-CM | POA: Insufficient documentation

## 2015-09-02 DIAGNOSIS — I5032 Chronic diastolic (congestive) heart failure: Secondary | ICD-10-CM | POA: Insufficient documentation

## 2015-09-02 DIAGNOSIS — Z8673 Personal history of transient ischemic attack (TIA), and cerebral infarction without residual deficits: Secondary | ICD-10-CM | POA: Insufficient documentation

## 2015-09-02 DIAGNOSIS — Z87891 Personal history of nicotine dependence: Secondary | ICD-10-CM | POA: Diagnosis not present

## 2015-09-02 DIAGNOSIS — I2510 Atherosclerotic heart disease of native coronary artery without angina pectoris: Secondary | ICD-10-CM | POA: Insufficient documentation

## 2015-09-02 DIAGNOSIS — N184 Chronic kidney disease, stage 4 (severe): Secondary | ICD-10-CM | POA: Insufficient documentation

## 2015-09-02 DIAGNOSIS — R110 Nausea: Secondary | ICD-10-CM | POA: Insufficient documentation

## 2015-09-02 DIAGNOSIS — I252 Old myocardial infarction: Secondary | ICD-10-CM | POA: Insufficient documentation

## 2015-09-02 DIAGNOSIS — Z7982 Long term (current) use of aspirin: Secondary | ICD-10-CM | POA: Diagnosis not present

## 2015-09-02 DIAGNOSIS — Z79899 Other long term (current) drug therapy: Secondary | ICD-10-CM | POA: Diagnosis not present

## 2015-09-02 DIAGNOSIS — E785 Hyperlipidemia, unspecified: Secondary | ICD-10-CM | POA: Insufficient documentation

## 2015-09-02 DIAGNOSIS — Z794 Long term (current) use of insulin: Secondary | ICD-10-CM | POA: Diagnosis not present

## 2015-09-02 DIAGNOSIS — I130 Hypertensive heart and chronic kidney disease with heart failure and stage 1 through stage 4 chronic kidney disease, or unspecified chronic kidney disease: Secondary | ICD-10-CM | POA: Insufficient documentation

## 2015-09-02 DIAGNOSIS — K5900 Constipation, unspecified: Secondary | ICD-10-CM | POA: Insufficient documentation

## 2015-09-02 LAB — CBC
HEMATOCRIT: 46.7 % — AB (ref 36.0–46.0)
Hemoglobin: 15 g/dL (ref 12.0–15.0)
MCH: 29.7 pg (ref 26.0–34.0)
MCHC: 32.1 g/dL (ref 30.0–36.0)
MCV: 92.5 fL (ref 78.0–100.0)
Platelets: 212 10*3/uL (ref 150–400)
RBC: 5.05 MIL/uL (ref 3.87–5.11)
RDW: 16 % — AB (ref 11.5–15.5)
WBC: 6.3 10*3/uL (ref 4.0–10.5)

## 2015-09-02 LAB — URINALYSIS, ROUTINE W REFLEX MICROSCOPIC
BILIRUBIN URINE: NEGATIVE
GLUCOSE, UA: NEGATIVE mg/dL
KETONES UR: NEGATIVE mg/dL
Leukocytes, UA: NEGATIVE
NITRITE: NEGATIVE
PH: 5.5 (ref 5.0–8.0)
Protein, ur: 100 mg/dL — AB
Specific Gravity, Urine: 1.015 (ref 1.005–1.030)

## 2015-09-02 LAB — COMPREHENSIVE METABOLIC PANEL
ALBUMIN: 4.9 g/dL (ref 3.5–5.0)
ALK PHOS: 99 U/L (ref 38–126)
ALT: 32 U/L (ref 14–54)
AST: 49 U/L — AB (ref 15–41)
Anion gap: 15 (ref 5–15)
BILIRUBIN TOTAL: 0.4 mg/dL (ref 0.3–1.2)
BUN: 39 mg/dL — AB (ref 6–20)
CALCIUM: 10.1 mg/dL (ref 8.9–10.3)
CO2: 18 mmol/L — ABNORMAL LOW (ref 22–32)
Chloride: 107 mmol/L (ref 101–111)
Creatinine, Ser: 2.12 mg/dL — ABNORMAL HIGH (ref 0.44–1.00)
GFR calc Af Amer: 22 mL/min — ABNORMAL LOW (ref >60)
GFR calc non Af Amer: 19 mL/min — ABNORMAL LOW (ref >60)
GLUCOSE: 163 mg/dL — AB (ref 65–99)
POTASSIUM: 4.9 mmol/L (ref 3.5–5.1)
Sodium: 140 mmol/L (ref 135–145)
TOTAL PROTEIN: 8.8 g/dL — AB (ref 6.5–8.1)

## 2015-09-02 LAB — URINE MICROSCOPIC-ADD ON

## 2015-09-02 LAB — CBG MONITORING, ED: Glucose-Capillary: 160 mg/dL — ABNORMAL HIGH (ref 65–99)

## 2015-09-02 LAB — LIPASE, BLOOD: Lipase: 23 U/L (ref 11–51)

## 2015-09-02 MED ORDER — BISACODYL 10 MG RE SUPP
10.0000 mg | Freq: Once | RECTAL | Status: AC
  Administered 2015-09-02: 10 mg via RECTAL
  Filled 2015-09-02: qty 1

## 2015-09-02 NOTE — ED Notes (Signed)
Per EMS- Patient c/o generalized abdominal pain x2 days. Patient's son reports no BM x 3 days. PCP was contacted yesterday and Miralax was ordered. NO N/V/D.

## 2015-09-02 NOTE — Discharge Instructions (Signed)
Use MiraLAX twice a day for 1 week, after that once a day. Drink plenty of water.    Constipation, Adult Constipation is when a person has fewer than three bowel movements a week, has difficulty having a bowel movement, or has stools that are dry, hard, or larger than normal. As people grow older, constipation is more common. A low-fiber diet, not taking in enough fluids, and taking certain medicines may make constipation worse.  CAUSES   Certain medicines, such as antidepressants, pain medicine, iron supplements, antacids, and water pills.   Certain diseases, such as diabetes, irritable bowel syndrome (IBS), thyroid disease, or depression.   Not drinking enough water.   Not eating enough fiber-rich foods.   Stress or travel.   Lack of physical activity or exercise.   Ignoring the urge to have a bowel movement.   Using laxatives too much.  SIGNS AND SYMPTOMS   Having fewer than three bowel movements a week.   Straining to have a bowel movement.   Having stools that are hard, dry, or larger than normal.   Feeling full or bloated.   Pain in the lower abdomen.   Not feeling relief after having a bowel movement.  DIAGNOSIS  Your health care provider will take a medical history and perform a physical exam. Further testing may be done for severe constipation. Some tests may include:  A barium enema X-ray to examine your rectum, colon, and, sometimes, your small intestine.   A sigmoidoscopy to examine your lower colon.   A colonoscopy to examine your entire colon. TREATMENT  Treatment will depend on the severity of your constipation and what is causing it. Some dietary treatments include drinking more fluids and eating more fiber-rich foods. Lifestyle treatments may include regular exercise. If these diet and lifestyle recommendations do not help, your health care provider may recommend taking over-the-counter laxative medicines to help you have bowel movements.  Prescription medicines may be prescribed if over-the-counter medicines do not work.  HOME CARE INSTRUCTIONS   Eat foods that have a lot of fiber, such as fruits, vegetables, whole grains, and beans.  Limit foods high in fat and processed sugars, such as french fries, hamburgers, cookies, candies, and soda.   A fiber supplement may be added to your diet if you cannot get enough fiber from foods.   Drink enough fluids to keep your urine clear or pale yellow.   Exercise regularly or as directed by your health care provider.   Go to the restroom when you have the urge to go. Do not hold it.   Only take over-the-counter or prescription medicines as directed by your health care provider. Do not take other medicines for constipation without talking to your health care provider first.  SEEK IMMEDIATE MEDICAL CARE IF:   You have bright red blood in your stool.   Your constipation lasts for more than 4 days or gets worse.   You have abdominal or rectal pain.   You have thin, pencil-like stools.   You have unexplained weight loss. MAKE SURE YOU:   Understand these instructions.  Will watch your condition.  Will get help right away if you are not doing well or get worse.   This information is not intended to replace advice given to you by your health care provider. Make sure you discuss any questions you have with your health care provider.   Document Released: 12/04/2003 Document Revised: 03/28/2014 Document Reviewed: 12/17/2012 Elsevier Interactive Patient Education [employer]  High-Fiber Diet Fiber, also called dietary fiber, is a type of carbohydrate found in fruits, vegetables, whole grains, and beans. A high-fiber diet can have many health benefits. Your health care provider may recommend a high-fiber diet to help:  Prevent constipation. Fiber can make your bowel movements more regular.  Lower your cholesterol.  Relieve hemorrhoids, uncomplicated  diverticulosis, or irritable bowel syndrome.  Prevent overeating as part of a weight-loss plan.  Prevent heart disease, type 2 diabetes, and certain cancers. WHAT IS MY PLAN? The recommended daily intake of fiber includes:  38 grams for men under age 61.  30 grams for men over age 72.  25 grams for women under age 82.  21 grams for women over age 68. You can get the recommended daily intake of dietary fiber by eating a variety of fruits, vegetables, grains, and beans. Your health care provider may also recommend a fiber supplement if it is not possible to get enough fiber through your diet. WHAT DO I NEED TO KNOW ABOUT A HIGH-FIBER DIET?  Fiber supplements have not been widely studied for their effectiveness, so it is better to get fiber through food sources.  Always check the fiber content on thenutrition facts label of any prepackaged food. Look for foods that contain at least 5 grams of fiber per serving.  Ask your dietitian if you have questions about specific foods that are related to your condition, especially if those foods are not listed in the following section.  Increase your daily fiber consumption gradually. Increasing your intake of dietary fiber too quickly may cause bloating, cramping, or gas.  Drink plenty of water. Water helps you to digest fiber. WHAT FOODS CAN I EAT? Grains Whole-grain breads. Multigrain cereal. Oats and oatmeal. Brown rice. Barley. Bulgur wheat. Millet. Bran muffins. Popcorn. Rye wafer crackers. Vegetables Sweet potatoes. Spinach. Kale. Artichokes. Cabbage. Broccoli. Green peas. Carrots. Squash. Fruits Berries. Pears. Apples. Oranges. Avocados. Prunes and raisins. Dried figs. Meats and Other Protein Sources Navy, kidney, pinto, and soy beans. Split peas. Lentils. Nuts and seeds. Dairy Fiber-fortified yogurt. Beverages Fiber-fortified soy milk. Fiber-fortified orange juice. Other Fiber bars. The items listed above may not be a complete  list of recommended foods or beverages. Contact your dietitian for more options. WHAT FOODS ARE NOT RECOMMENDED? Grains White bread. Pasta made with refined flour. White rice. Vegetables Fried potatoes. Canned vegetables. Well-cooked vegetables.  Fruits Fruit juice. Cooked, strained fruit. Meats and Other Protein Sources Fatty cuts of meat. Fried Environmental education officer or fried fish. Dairy Milk. Yogurt. Cream cheese. Sour cream. Beverages Soft drinks. Other Cakes and pastries. Butter and oils. The items listed above may not be a complete list of foods and beverages to avoid. Contact your dietitian for more information. WHAT ARE SOME TIPS FOR INCLUDING HIGH-FIBER FOODS IN MY DIET?  Eat a wide variety of high-fiber foods.  Make sure that half of all grains consumed each day are whole grains.  Replace breads and cereals made from refined flour or white flour with whole-grain breads and cereals.  Replace white rice with brown rice, bulgur wheat, or millet.  Start the day with a breakfast that is high in fiber, such as a cereal that contains at least 5 grams of fiber per serving.  Use beans in place of meat in soups, salads, or pasta.  Eat high-fiber snacks, such as berries, raw vegetables, nuts, or popcorn.   This information is not intended to replace advice given to you by your health care provider. Make sure you  discuss any questions you have with your health care provider.   Document Released: 03/07/2005 Document Revised: 03/28/2014 Document Reviewed: 08/20/2013 Elsevier Interactive Patient Education [employer]

## 2015-09-02 NOTE — ED Notes (Signed)
Provided pt and visitor with warm blanket.

## 2015-09-02 NOTE — ED Notes (Signed)
[facility] notified that patient and family member need transport back home.

## 2015-09-02 NOTE — ED Notes (Signed)
Pt urinated in bedpan

## 2015-09-02 NOTE — ED Provider Notes (Signed)
CSN: [id number]     Arrival date & time 09/02/15  0940 History   First MD Initiated Contact with Patient 09/02/15 1123     Chief Complaint  Patient presents with  . Abdominal Pain  . Constipation     (Consider location/radiation/quality/duration/timing/severity/associated sxs/prior Treatment) HPI  [name] is a 82 y.o. female who presents for evaluation of abdominal swelling and decreased bowel movements. No bowel movement in the last 3 days. Nausea without vomiting. She denies fever, chills, weakness or dizziness. She is assisted at home by her son. No other recent problems. There are no other known modifying factors.   Past Medical History  Diagnosis Date  . Coronary artery disease     a. Cath 09/2010 - med rx.  Marland Kitchen TIA (transient ischemic attack)   . Hypertension   . Shingles   . Stroke (HCC)   . Anemia   . Cardiomyopathy, idiopathic (HCC) 02/08/2011  . Myocardial infarct (HCC)     x 3 unsure of years  . Irregular heartbeat   . Ulcer   . GERD (gastroesophageal reflux disease)   . Hx of transient ischemic attack (TIA)   . Memory loss   . Chronic diastolic CHF (congestive heart failure) (HCC)     Takes Lasix  . Gout     takes allopurinol  . Peripheral vascular disease (HCC)     a. s/p L BKA.  . Diabetes mellitus     type 2 NIDDM  . Chronic kidney disease (CKD), stage IV (severe) (HCC)   . Arthritis   . Hypothyroidism     (SEVERE) Takes Levothryroxine  . Nonischemic cardiomyopathy (HCC)     EF now is 55%, reduced due to myxedema, which is improved.  . Dyslipidemia   . Peripheral neuropathy (HCC)   . H/O echocardiogram 09/06/11    Indication- nonIschemic Cardiomyopathy. EF = now greater than 55% with no regional wall motion abnormalities. Tthere is mild to moderate trisuspid regurgitayion and mild pulmonary hypertension with an RVSP of 35 mmHg as well as stage 1 diastolic dysfunction and mild to moderate LVH.  Marland Kitchen Abnormal nuclear stress test 06/01/09     Demonstrated a new area of infarct scar, peri-infarct ischemia seen in the inferolateral territory. EF eas normal at 70% with mild hypocontractility at the apex, distal inferolateral wall.   Past Surgical History  Procedure Laterality Date  . Back surgery      [facility]  . Eye surgery      Left eye surgery; cataract removal  . Left bka  07/05/2011  . Av fistula placement    . Amputation  07/05/2011    Procedure: AMPUTATION BELOW KNEE;  Surgeon: Chuck Hint, MD;  Location: [facility] OR;  Service: Vascular;  Laterality: Left;  . Angioplasty  1988  . Cardiac catheterization  09/28/07    Demonstrated multiple sequential lesions around 40 to 30% in the RCA territory.  . Left lower extremity venous duplex Left 06/27/11    Summary: No evidence of DVT involving the left lower extremity and right common femoral vein.   . Lower extremity arterial evaluation  06/27/11    SUMMARY: Right: ABI not ascertained due to false elevation in BP secondary to calcification (posterior tibial artery is non compressible). Left: ABI indicates moderate reduction in arterial flow. Bilateral: Great toe PPG waveforms indicate adequate perfusion. Great toe pressures not obtained due to patient's movements secondary to pain.  . Duplex doppler  05/10/11    LE arterial dopplers  demonstrate bilaterally reduced ABIs of 0.91 on right & 0.56 on left. She does report some decreased pain on the left, & there's moderate mixed-density plaque in the right SFA w/50 to 69% reduction. There's a 69% reduction in the left SFA & does appear to be occlusive disease of left posterior tibial artery. Right posterior dorsalis pedis artery demonstrates occlusive disease  . Insertion of dialysis catheter N/A 01/06/2013    Procedure: INSERTION OF DIALYSIS CATHETER;  Surgeon: Larina Earthly, MD;  Location: [facility] OR;  Service: Vascular;  Laterality: N/A;  . Av fistula placement Right 12/24/2013    Procedure: INSERTION OF ARTERIOVENOUS (AV) GORE-TEX  GRAFT ARM;  Surgeon: Chuck Hint, MD;  Location: [facility] OR;  Service: Vascular;  Laterality: Right;  . Lower extremity angiogram N/A 10/31/2011    Procedure: LOWER EXTREMITY ANGIOGRAM;  Surgeon: Chuck Hint, MD;  Location: [facility] CATH LAB;  Service: Cardiovascular;  Laterality: N/A;  . Shuntogram N/A 03/17/2014    Procedure: Betsey Amen;  Surgeon: Fransisco Hertz, MD;  Location: [facility] CATH LAB;  Service: Cardiovascular;  Laterality: N/A;  . Ligation arteriovenous gortex graft Right 03/25/2014    Procedure: LIGATION ARTERIOVENOUS GORTEX GRAFT;  Surgeon: Chuck Hint, MD;  Location[facility] OR;  Service: Vascular;  Laterality: Right;  . Esophagogastroduodenoscopy N/A 04/15/2014    Procedure: ESOPHAGOGASTRODUODENOSCOPY (EGD);  Surgeon: Rachael Fee, MD;  Location: [facility] ENDOSCOPY;  Service: Endoscopy;  Laterality: N/A;   Family History  Problem Relation Age of Onset  . Cancer Sister     STOMACH  . Diabetes Sister   . Cancer Brother     BONE  . Diabetes Brother   . Anesthesia problems Neg Hx   . Hypotension Neg Hx   . Malignant hyperthermia Neg Hx   . Pseudochol deficiency Neg Hx   . Hyperlipidemia Daughter   . Hypertension Daughter   . Heart disease Daughter     before age 14  . Kidney disease Daughter   . Other Daughter     varicose veins  . Diabetes Daughter   . Heart disease Son     before age 32  . Hyperlipidemia Son   . Hypertension Son   . Heart attack Son   . Heart attack Daughter   . Heart disease Daughter     Before age 35   Social History  Substance Use Topics  . Smoking status: Former Smoker -- 0.50 packs/day for 40 years    Quit date: 07/05/1986  . Smokeless tobacco: Former Neurosurgeon     Comment: quit smoking 1988  . Alcohol Use: No   OB History    No data available     Review of Systems  All other systems reviewed and are negative.     Allergies  Aspirin  Home Medications   Prior to Admission medications   Medication Sig Start Date End Date  Taking? Authorizing Provider  acetaminophen (TYLENOL) 325 MG tablet Take 650 mg by mouth every 6 (six) hours as needed for mild pain.   Yes Historical Provider, MD  albuterol-ipratropium (COMBIVENT) 18-103 MCG/ACT inhaler Inhale 2 puffs into the lungs at bedtime.    Yes Historical Provider, MD  allopurinol (ZYLOPRIM) 100 MG tablet Take 200 mg by mouth daily.    Yes Historical Provider, MD  amLODipine (NORVASC) 5 MG tablet Take 5 mg by mouth daily. 06/04/14  Yes Historical Provider, MD  aspirin EC 81 MG tablet Take 81 mg by mouth daily.   Yes Historical Provider, MD  calcitRIOL (ROCALTROL) 0.25 MCG capsule Take 0.25 mcg by mouth every other day.  11/04/12  Yes Historical Provider, MD  carvedilol (COREG) 6.25 MG tablet TAKE 1 TABLET (6.25 MG TOTAL) BY MOUTH 2 (TWO) TIMES DAILY WITH A MEAL. NEED OV. 05/04/15  Yes Chrystie Nose, MD  clopidogrel (PLAVIX) 75 MG tablet Take 75 mg by mouth every evening.    Yes Historical Provider, MD  docusate sodium (COLACE) 100 MG capsule Take 100 mg by mouth daily as needed for mild constipation.   Yes Historical Provider, MD  furosemide (LASIX) 80 MG tablet TAKE 1 TABLET (80 MG TOTAL) BY MOUTH 2 (TWO) TIMES DAILY. 01/07/15  Yes Chrystie Nose, MD  gabapentin (NEURONTIN) 300 MG capsule Take 300 mg by mouth 3 (three) times daily.   Yes Historical Provider, MD  HYDROcodone-acetaminophen (NORCO/VICODIN) 5-325 MG tablet Take 1-2 tablets by mouth every 6 (six) hours as needed. 06/12/15  Yes Benjiman Core, MD  insulin glargine (LANTUS) 100 UNIT/ML injection Inject 0.22 mLs (22 Units total) into the skin daily. Patient taking differently: Inject 26 Units into the skin daily.  08/23/13  Yes [name], MD  isosorbide mononitrate (IMDUR) 60 MG 24 hr tablet Take 60 mg by mouth daily.   Yes Historical Provider, MD  levothyroxine (SYNTHROID, LEVOTHROID) 75 MCG tablet Take 75 mcg by mouth daily before breakfast.   Yes Historical Provider, MD  nitroGLYCERIN (NITROSTAT) 0.4 MG SL  tablet Place 0.4 mg under the tongue every 5 (five) minutes as needed for chest pain.    Yes Historical Provider, MD  pravastatin (PRAVACHOL) 40 MG tablet Take 40 mg by mouth every evening.    Yes Historical Provider, MD  RENVELA 800 MG tablet Take 800 mg by mouth 3 (three) times daily with meals.  07/04/13  Yes Historical Provider, MD   BP 212/95 mmHg  Pulse 62  Temp(Src) 97.7 F (36.5 C) (Oral)  Resp 18  Ht 4\' 9"  (1.448 m)  Wt 130 lb (58.968 kg)  BMI 28.12 kg/m2  SpO2 98% Physical Exam  Constitutional: She is oriented to person, place, and time. She appears well-developed and well-nourished.  HENT:  Head: Normocephalic and atraumatic.  Right Ear: External ear normal.  Left Ear: External ear normal.  Eyes: Conjunctivae and EOM are normal. Pupils are equal, round, and reactive to light.  Neck: Normal range of motion and phonation normal. Neck supple.  Cardiovascular: Normal rate, regular rhythm and normal heart sounds.   Pulmonary/Chest: Effort normal and breath sounds normal. She exhibits no bony tenderness.  Abdominal: Soft. There is no rebound.  Mild diffuse upper abdominal tenderness, bilaterally. No rebound tenderness or guarding.  Musculoskeletal: Normal range of motion.  Above-knee amputation, right.  Neurological: She is alert and oriented to person, place, and time. No cranial nerve deficit or sensory deficit. She exhibits normal muscle tone. Coordination normal.  Skin: Skin is warm, dry and intact.  Psychiatric: She has a normal mood and affect. Her behavior is normal. Judgment and thought content normal.  Nursing note and vitals reviewed.   ED Course  Procedures (including critical care time)  Medications  bisacodyl (DULCOLAX) suppository 10 mg (10 mg Rectal Given 09/02/15 1205)    Patient Vitals for the past 24 hrs:  BP Temp Temp src Pulse Resp SpO2 Height Weight  09/02/15 1134 (!) 212/95 mmHg - - 62 18 98 % - -  09/02/15 1127 - - Oral 66 15 100 % - -  09/02/15  0946 - - - - -  100 % - -  09/02/15 0943 154/95 mmHg 97.7 F (36.5 C) Oral 70 16 100 % 4\' 9"  (1.448 m) 130 lb (58.968 kg)    1:59 PM Reevaluation with update and discussion. After initial assessment and treatment, an updated evaluation reveals She feels better and has had 2 large-volume bowel movements after bisacodyl suppository given. Findings discussed with patient and son, all questions answered. Marjoria Mancillas L   14:10- manual blood pressure right arm, by me 148/82.  Labs Review Labs Reviewed  COMPREHENSIVE METABOLIC PANEL - Abnormal; Notable for the following:    CO2 18 (*)    Glucose, Bld 163 (*)    BUN 39 (*)    Creatinine, Ser 2.12 (*)    Total Protein 8.8 (*)    AST 49 (*)    GFR calc non Af Amer 19 (*)    GFR calc Af Amer 22 (*)    All other components within normal limits  CBC - Abnormal; Notable for the following:    HCT 46.7 (*)    RDW 16.0 (*)    All other components within normal limits  URINALYSIS, ROUTINE W REFLEX MICROSCOPIC (NOT AT [facility]) - Abnormal; Notable for the following:    APPearance CLOUDY (*)    Hgb urine dipstick SMALL (*)    Protein, ur 100 (*)    All other components within normal limits  URINE MICROSCOPIC-ADD ON - Abnormal; Notable for the following:    Squamous Epithelial / LPF 6-30 (*)    Bacteria, UA MANY (*)    Casts HYALINE CASTS (*)    All other components within normal limits  CBG MONITORING, ED - Abnormal; Notable for the following:    Glucose-Capillary 160 (*)    All other components within normal limits  LIPASE, BLOOD    Imaging Review No results found. I have personally reviewed and evaluated these images and lab results as part of my medical decision-making.   EKG Interpretation None      MDM   Final diagnoses:  Constipation, unspecified constipation type    Constipation, without evidence for obstruction. Patient improved after symptomatic treatment, and stable for discharge.  Nursing Notes Reviewed/ Care  Coordinated Applicable Imaging Reviewed Interpretation of Laboratory Data incorporated into ED treatment  The patient appears reasonably screened and/or stabilized for discharge and I doubt any other medical condition or other [facility] requiring further screening, evaluation, or treatment in the ED at this time prior to discharge.  Plan: Home Medications- MiraLAX twice a day for 1 week, then once a day; Home Treatments- increase fluid and fiber in diet.; return here if the recommended treatment, does not improve the symptoms; Recommended follow up- PCP checkup one week.     Mancel Bale, MD 09/02/15 [phone]

## 2015-09-02 NOTE — ED Notes (Signed)
Patient had BM x1 on bedpan.

## 2015-09-02 NOTE — ED Notes (Signed)
Assisted patient to change back into her street clothing, awaiting [facility] arrival to transport patient and family member back to her residence.

## 2015-09-03 ENCOUNTER — Other Ambulatory Visit: Payer: Self-pay

## 2015-09-03 MED ORDER — CARVEDILOL 6.25 MG PO TABS: 6.2500 mg | ORAL_TABLET | Freq: Two times a day (BID) | ORAL | Status: DC

## 2015-09-18 ENCOUNTER — Ambulatory Visit (INDEPENDENT_AMBULATORY_CARE_PROVIDER_SITE_OTHER): Payer: Medicare Other | Admitting: Internal Medicine

## 2015-09-18 ENCOUNTER — Encounter: Payer: Self-pay | Admitting: Internal Medicine

## 2015-09-18 VITALS — BP 110/64 | HR 48 | Ht <= 58 in

## 2015-09-18 DIAGNOSIS — I739 Peripheral vascular disease, unspecified: Secondary | ICD-10-CM | POA: Diagnosis not present

## 2015-09-18 DIAGNOSIS — R001 Bradycardia, unspecified: Secondary | ICD-10-CM

## 2015-09-18 DIAGNOSIS — N184 Chronic kidney disease, stage 4 (severe): Secondary | ICD-10-CM

## 2015-09-18 NOTE — Progress Notes (Signed)
OFFICE NOTE  Chief Complaint:  No complaints  Primary Care Physician: Georgianne Fick, MD  HPI:  [name] is a pleasant 82 year old female with an unfortunate history of heart failure in the past due to severe hypothyroidism. EF has subsequently normalized. However, she does have significant peripheral arterial disease; underwent a left BKA. When I saw her last on December 13, she was having problems with rest pain in her right foot, and discoloration of the sole of her foot and toes, with tenderness to palpation. I had prescribed her for pain medicine and had reviewed her lower extremity arteriogram with Dr. Allyson Sabal, and felt that there were very few options for revascularization. She returned today and has reported some improvement with reduction in pain, and there does appear to be less discoloration. However, pulses still remain very weak. Her family reports she's also had a mild cough and does seem a little bit more fatigued. She did not report any worsening shortness of breath or orthopnea but has had some difficulty transferring. At her last office visit, her JVP is elevated and there was some trace right lower extremity edema. She did have basilar crackles and appeared to be volume overloaded. I recommended increasing her diuretics and ordered a BMP and BNP. She was to return to the office in 2 weeks for reassessment. Unfortunately she never made that appointment - ultimately I recheck some laboratory work and she was noted to have progressive renal failure. She was ultimately brought to the ED with complaints of worsening SOB and Wheezing. EMS was called to her home and found her O2 sats to be 70% and she was placed on supplemental O2, and had mild improvement. She was found to have findings of CHF, however, her EF on echo was 65-70%. She has acute on chronic renal failure - her fistula could not be accessed and a temporary dialysis catheter was placed. She was dialyzed while in the  hospital and was discharged to rehabilitation. She has not had any followup dialysis and is supposed to be getting weekly test of her renal function being sent to her nephrologist. I do not see where she has an appointment with her nephrologist.  [name] returns today after a number of recent hospitalizations. In October she underwent fistula placement this was complicated by swelling of the arm and it was felt the fistula had to be closed. She now has little if any vascular access. She was then hospitalized for epiglottitis and treated with antibiotics. She continues to be wheezy. All of her wheezes upper airway. Weight is fortunately been stable she's denied any chest pain or worsening heart failure symptoms.   I saw [name] back in the office today. Overall she is without any complaints. She continues to have very poor renal function with a GFR between 15 and 20. She has no dialysis access. She's not had any end-of-life discussions and I briefly spoke with her today about that. She may wish to consider her options, especially DO NOT RESUSCITATE if she has no dialysis access. Given her age and comorbidities this is a very realistic thing to think about.  09/18/2015  [name] returns today for follow-up. Overall she is doing fairly well. She did have a fistula but is not yet on dialysis. Her quality of life is fairly sedentary. An EKG was performed today due to bradycardia and she was noted to be in a junctional rhythm at 48. She is on carvedilol 6.25 mg twice a day due to  her cardiomyopathy. 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  PMHx:  Past Medical History  Diagnosis Date  . Coronary artery disease     a. Cath 09/2010 - med rx.  Marland Kitchen TIA (transient ischemic attack)   . Hypertension   . Shingles   . Stroke (HCC)   . Anemia   . Cardiomyopathy, idiopathic (HCC) 02/08/2011  . Myocardial infarct (HCC)     x 3 unsure  of years  . Irregular heartbeat   . Ulcer   . GERD (gastroesophageal reflux disease)   . Hx of transient ischemic attack (TIA)   . Memory loss   . Chronic diastolic CHF (congestive heart failure) (HCC)     Takes Lasix  . Gout     takes allopurinol  . Peripheral vascular disease (HCC)     a. s/p L BKA.  . Diabetes mellitus     type 2 NIDDM  . Chronic kidney disease (CKD), stage IV (severe) (HCC)   . Arthritis   . Hypothyroidism     (SEVERE) Takes Levothryroxine  . Nonischemic cardiomyopathy (HCC)     EF now is 55%, reduced due to myxedema, which is improved.  . Dyslipidemia   . Peripheral neuropathy (HCC)   . H/O echocardiogram 09/06/11    Indication- nonIschemic Cardiomyopathy. EF = now greater than 55% with no regional wall motion abnormalities. Tthere is mild to moderate trisuspid regurgitayion and mild pulmonary hypertension with an RVSP of 35 mmHg as well as stage 1 diastolic dysfunction and mild to moderate LVH.  Marland Kitchen Abnormal nuclear stress test 06/01/09    Demonstrated a new area of infarct scar, peri-infarct ischemia seen in the inferolateral territory. EF eas normal at 70% with mild hypocontractility at the apex, distal inferolateral wall.    Past Surgical History  Procedure Laterality Date  . Back surgery      [facility]  . Eye surgery      Left eye surgery; cataract removal  . Left bka  07/05/2011  . Av fistula placement    . Amputation  07/05/2011    Procedure: AMPUTATION BELOW KNEE;  Surgeon: Chuck Hint, MD;  Location: [facility] OR;  Service: Vascular;  Laterality: Left;  . Angioplasty  1988  . Cardiac catheterization  09/28/07    Demonstrated multiple sequential lesions around 40 to 30% in the RCA territory.  . Left lower extremity venous duplex Left 06/27/11    Summary: No evidence of DVT involving the left lower extremity and right common femoral vein.   . Lower extremity arterial evaluation  06/27/11    SUMMARY: Right: ABI not ascertained due to false  elevation in BP secondary to calcification (posterior tibial artery is non compressible). Left: ABI indicates moderate reduction in arterial flow. Bilateral: Great toe PPG waveforms indicate adequate perfusion. Great toe pressures not obtained due to patient's movements secondary to pain.  . Duplex doppler  05/10/11    LE arterial dopplers demonstrate bilaterally reduced ABIs of 0.91 on right & 0.56 on left. She does report some decreased pain on the left, & there's moderate mixed-density plaque in the right SFA w/50 to 69% reduction. There's a 69% reduction in the left SFA & does appear to be occlusive disease of left posterior tibial artery. Right posterior dorsalis pedis artery demonstrates occlusive disease  . Insertion of dialysis catheter N/A 01/06/2013    Procedure: INSERTION OF DIALYSIS CATHETER;  Surgeon: Tawanna Cooler  Shirline Frees, MD;  Location: [facility] OR;  Service: Vascular;  Laterality: N/A;  . Av fistula placement Right 12/24/2013    Procedure: INSERTION OF ARTERIOVENOUS (AV) GORE-TEX GRAFT ARM;  Surgeon: Chuck Hint, MD;  Location: [facility] OR;  Service: Vascular;  Laterality: Right;  . Lower extremity angiogram N/A 10/31/2011    Procedure: LOWER EXTREMITY ANGIOGRAM;  Surgeon: Chuck Hint, MD;  Location: [facility] CATH LAB;  Service: Cardiovascular;  Laterality: N/A;  . Shuntogram N/A 03/17/2014    Procedure: Betsey Amen;  Surgeon: Fransisco Hertz, MD;  Location: [facility] CATH LAB;  Service: Cardiovascular;  Laterality: N/A;  . Ligation arteriovenous gortex graft Right 03/25/2014    Procedure: LIGATION ARTERIOVENOUS GORTEX GRAFT;  Surgeon: Chuck Hint, MD;  Location: [facility] OR;  Service: Vascular;  Laterality: Right;  . Esophagogastroduodenoscopy N/A 04/15/2014    Procedure: ESOPHAGOGASTRODUODENOSCOPY (EGD);  Surgeon: Rachael Fee, MD;  Location: [facility] ENDOSCOPY;  Service: Endoscopy;  Laterality: N/A;    FAMHx:  Family History  Problem Relation Age of Onset  . Cancer Sister     STOMACH  . Diabetes  Sister   . Cancer Brother     BONE  . Diabetes Brother   . Anesthesia problems Neg Hx   . Hypotension Neg Hx   . Malignant hyperthermia Neg Hx   . Pseudochol deficiency Neg Hx   . Hyperlipidemia Daughter   . Hypertension Daughter   . Heart disease Daughter     before age 36  . Kidney disease Daughter   . Other Daughter     varicose veins  . Diabetes Daughter   . Heart disease Son     before age 58  . Hyperlipidemia Son   . Hypertension Son   . Heart attack Son   . Heart attack Daughter   . Heart disease Daughter     Before age 20    SOCHx:   reports that she quit smoking about 29 years ago. She has quit using smokeless tobacco. She reports that she does not drink alcohol or use illicit drugs.  ALLERGIES:  Allergies  Allergen Reactions  . Aspirin Nausea And Vomiting    325 mg (adult strength) [name]     ROS: Pertinent items noted in HPI and remainder of comprehensive ROS otherwise negative.  HOME MEDS: Current Outpatient Prescriptions  Medication Sig Dispense Refill  . acetaminophen (TYLENOL) 325 MG tablet Take 650 mg by mouth every 6 (six) hours as needed for mild pain.    Marland Kitchen albuterol-ipratropium (COMBIVENT) 18-103 MCG/ACT inhaler Inhale 2 puffs into the lungs at bedtime.     Marland Kitchen allopurinol (ZYLOPRIM) 100 MG tablet Take 200 mg by mouth daily.     Marland Kitchen amLODipine (NORVASC) 5 MG tablet Take 5 mg by mouth daily.  0  . aspirin EC 81 MG tablet Take 81 mg by mouth daily.    . calcitRIOL (ROCALTROL) 0.25 MCG capsule Take 0.25 mcg by mouth every other day.     . carvedilol (COREG) 6.25 MG tablet Take 1 tablet (6.25 mg total) by mouth 2 (two) times daily with a meal. 90 tablet 2  . clopidogrel (PLAVIX) 75 MG tablet Take 75 mg by mouth every evening.     . docusate sodium (COLACE) 100 MG capsule Take 100 mg by mouth daily as needed for mild constipation.    . furosemide (LASIX) 80 MG tablet TAKE 1 TABLET (80 MG TOTAL)  BY MOUTH 2 (  TWO) TIMES DAILY. 60 tablet 11  . gabapentin (NEURONTIN) 300 MG capsule Take 300 mg by mouth 3 (three) times daily.    Marland Kitchen HYDROcodone-acetaminophen (NORCO/VICODIN) 5-325 MG tablet Take 1-2 tablets by mouth every 6 (six) hours as needed. 10 tablet 0  . insulin glargine (LANTUS) 100 UNIT/ML injection Inject 0.22 mLs (22 Units total) into the skin daily. (Patient taking differently: Inject 26 Units into the skin daily. ) 10 mL 11  . isosorbide mononitrate (IMDUR) 60 MG 24 hr tablet Take 60 mg by mouth daily.    Marland Kitchen levothyroxine (SYNTHROID, LEVOTHROID) 75 MCG tablet Take 75 mcg by mouth daily before breakfast.    . nitroGLYCERIN (NITROSTAT) 0.4 MG SL tablet Place 0.4 mg under the tongue every 5 (five) minutes as needed for chest pain.     . pravastatin (PRAVACHOL) 40 MG tablet Take 40 mg by mouth every evening.     Marland Kitchen RENVELA 800 MG tablet Take 800 mg by mouth 3 (three) times daily with meals.      No current facility-administered medications for this visit.    LABS/IMAGING: No results found for this or any previous visit (from the past 48 hour(s)). No results found.  VITALS: BP 110/64 mmHg  Pulse 48  Ht 4\' 9"  (1.448 m)  SpO2 93%  EXAM: General appearance: alert and no distress Neck: JVD flat, no carotid bruits, upper airway wheeze Lungs: diminished breath sounds bilaterally, faint basilar crackles Heart: regular rate and rhythm Abdomen: soft, mildly protuberant Extremities: no edema on the right leg, left is BKA Pulses: faint pulse in the right foot Skin: Skin color, texture, turgor normal. No rashes or lesions Neurologic: Mental status: Alert, oriented, thought content appropriate  EKG: Junctional rhythm at 48  ASSESSMENT: 1. Symptomatic junctional bradycardia 2. Chronic systolic heart failure 3. Acute on chronic kidney disease stage V-hemodialysis catheter in place 4. PAD with left BKA (wheelchair bound) 5. History of severe hypothyroidism with non-ischemic  cardiomyopathy, EF improved to 65-70% 6. ESRD, not on HD - fistula in place  PLAN: 1.   [name] has had some recent fatigue and was noted to be in a junctional bradycardia at 48 today. I've advised her to discontinue her carvedilol and will plan to see her back in 2-3 weeks to see if her heart rate is improved and her symptoms are improved as well. Recently she had an increase in her amlodipine to 10 mg daily. She'll need to stay on this dose is likely blood pressure will go up some after discontinuing her carvedilol. Blood pressure today is at goal 110/64.  Chrystie Nose, MD, [facility] Attending Cardiologist [facility]  Chrystie Nose 09/18/2015, 9:43 AM

## 2015-09-18 NOTE — Patient Instructions (Signed)
Your physician has recommended you make the following change in your medication: STOP carvedilol (coreg)  Your physician recommends that you schedule a follow-up appointment in: 2-3 weeks with Dr. Rennis Golden (OK to double book)

## 2015-10-02 ENCOUNTER — Emergency Department (HOSPITAL_COMMUNITY)
Admission: EM | Admit: 2015-10-02 | Discharge: 2015-10-02 | Disposition: A | Discharge status: Home / Self Care | Payer: Medicare Other | Attending: Emergency Medicine | Admitting: Emergency Medicine

## 2015-10-02 ENCOUNTER — Encounter (HOSPITAL_COMMUNITY): Payer: Self-pay | Admitting: Emergency Medicine

## 2015-10-02 ENCOUNTER — Emergency Department (HOSPITAL_COMMUNITY): Payer: Medicare Other

## 2015-10-02 DIAGNOSIS — R9431 Abnormal electrocardiogram [ECG] [EKG]: Secondary | ICD-10-CM | POA: Diagnosis not present

## 2015-10-02 DIAGNOSIS — E1142 Type 2 diabetes mellitus with diabetic polyneuropathy: Secondary | ICD-10-CM | POA: Insufficient documentation

## 2015-10-02 DIAGNOSIS — N184 Chronic kidney disease, stage 4 (severe): Secondary | ICD-10-CM | POA: Insufficient documentation

## 2015-10-02 DIAGNOSIS — R1084 Generalized abdominal pain: Secondary | ICD-10-CM | POA: Insufficient documentation

## 2015-10-02 DIAGNOSIS — Z79899 Other long term (current) drug therapy: Secondary | ICD-10-CM | POA: Insufficient documentation

## 2015-10-02 DIAGNOSIS — R112 Nausea with vomiting, unspecified: Secondary | ICD-10-CM | POA: Diagnosis not present

## 2015-10-02 DIAGNOSIS — I5032 Chronic diastolic (congestive) heart failure: Secondary | ICD-10-CM | POA: Diagnosis not present

## 2015-10-02 DIAGNOSIS — Z7982 Long term (current) use of aspirin: Secondary | ICD-10-CM | POA: Insufficient documentation

## 2015-10-02 DIAGNOSIS — I2510 Atherosclerotic heart disease of native coronary artery without angina pectoris: Secondary | ICD-10-CM | POA: Diagnosis not present

## 2015-10-02 DIAGNOSIS — Z87891 Personal history of nicotine dependence: Secondary | ICD-10-CM | POA: Insufficient documentation

## 2015-10-02 DIAGNOSIS — Z8673 Personal history of transient ischemic attack (TIA), and cerebral infarction without residual deficits: Secondary | ICD-10-CM | POA: Insufficient documentation

## 2015-10-02 DIAGNOSIS — Z794 Long term (current) use of insulin: Secondary | ICD-10-CM | POA: Insufficient documentation

## 2015-10-02 DIAGNOSIS — I130 Hypertensive heart and chronic kidney disease with heart failure and stage 1 through stage 4 chronic kidney disease, or unspecified chronic kidney disease: Secondary | ICD-10-CM | POA: Diagnosis not present

## 2015-10-02 LAB — COMPREHENSIVE METABOLIC PANEL
ALBUMIN: 4.4 g/dL (ref 3.5–5.0)
ALK PHOS: 109 U/L (ref 38–126)
ALT: 41 U/L (ref 14–54)
ANION GAP: 11 (ref 5–15)
AST: 54 U/L — ABNORMAL HIGH (ref 15–41)
BILIRUBIN TOTAL: 0.9 mg/dL (ref 0.3–1.2)
BUN: 28 mg/dL — AB (ref 6–20)
CALCIUM: 9.9 mg/dL (ref 8.9–10.3)
CO2: 23 mmol/L (ref 22–32)
CREATININE: 2.04 mg/dL — AB (ref 0.44–1.00)
Chloride: 105 mmol/L (ref 101–111)
GFR calc Af Amer: 24 mL/min — ABNORMAL LOW (ref >60)
GFR calc non Af Amer: 20 mL/min — ABNORMAL LOW (ref >60)
GLUCOSE: 214 mg/dL — AB (ref 65–99)
Potassium: 4.9 mmol/L (ref 3.5–5.1)
SODIUM: 139 mmol/L (ref 135–145)
TOTAL PROTEIN: 8.4 g/dL — AB (ref 6.5–8.1)

## 2015-10-02 LAB — URINALYSIS, ROUTINE W REFLEX MICROSCOPIC
BILIRUBIN URINE: NEGATIVE
Glucose, UA: 100 mg/dL — AB
HGB URINE DIPSTICK: NEGATIVE
KETONES UR: 15 mg/dL — AB
Leukocytes, UA: NEGATIVE
NITRITE: NEGATIVE
SPECIFIC GRAVITY, URINE: 1.018 (ref 1.005–1.030)
pH: 6 (ref 5.0–8.0)

## 2015-10-02 LAB — CBC
HCT: 43.5 % (ref 36.0–46.0)
Hemoglobin: 14 g/dL (ref 12.0–15.0)
MCH: 29.4 pg (ref 26.0–34.0)
MCHC: 32.2 g/dL (ref 30.0–36.0)
MCV: 91.4 fL (ref 78.0–100.0)
PLATELETS: 326 10*3/uL (ref 150–400)
RBC: 4.76 MIL/uL (ref 3.87–5.11)
RDW: 15.8 % — AB (ref 11.5–15.5)
WBC: 6.6 10*3/uL (ref 4.0–10.5)

## 2015-10-02 LAB — URINE MICROSCOPIC-ADD ON
RBC / HPF: NONE SEEN RBC/hpf (ref 0–5)
SQUAMOUS EPITHELIAL / LPF: NONE SEEN
WBC, UA: NONE SEEN WBC/hpf (ref 0–5)

## 2015-10-02 LAB — I-STAT TROPONIN, ED: Troponin i, poc: 0.01 ng/mL (ref 0.00–0.08)

## 2015-10-02 LAB — I-STAT CG4 LACTIC ACID, ED: LACTIC ACID, VENOUS: 1.56 mmol/L (ref 0.5–1.9)

## 2015-10-02 LAB — LIPASE, BLOOD: Lipase: 24 U/L (ref 11–51)

## 2015-10-02 MED ORDER — SODIUM CHLORIDE 0.9 % IV BOLUS (SEPSIS)
500.0000 mL | Freq: Once | INTRAVENOUS | Status: AC
  Administered 2015-10-02: 500 mL via INTRAVENOUS

## 2015-10-02 MED ORDER — DIATRIZOATE MEGLUMINE & SODIUM 66-10 % PO SOLN
ORAL | Status: AC
  Filled 2015-10-02: qty 30

## 2015-10-02 MED ORDER — MORPHINE SULFATE (PF) 2 MG/ML IV SOLN
2.0000 mg | INTRAVENOUS | Status: DC | PRN
Start: 2015-10-02 — End: 2015-10-02
  Administered 2015-10-02: 2 mg via INTRAVENOUS
  Filled 2015-10-02: qty 1

## 2015-10-02 MED ORDER — ONDANSETRON 4 MG PO TBDP: 4.0000 mg | ORAL_TABLET | Freq: Three times a day (TID) | ORAL | Status: DC | PRN

## 2015-10-02 MED ORDER — ONDANSETRON HCL 4 MG/2ML IJ SOLN
4.0000 mg | Freq: Once | INTRAMUSCULAR | Status: AC
  Administered 2015-10-02: 4 mg via INTRAVENOUS
  Filled 2015-10-02: qty 2

## 2015-10-02 MED ORDER — ONDANSETRON 4 MG PO TBDP
4.0000 mg | ORAL_TABLET | Freq: Once | ORAL | Status: AC | PRN
  Administered 2015-10-02: 4 mg via ORAL
  Filled 2015-10-02: qty 1

## 2015-10-02 MED ORDER — MORPHINE SULFATE (PF) 2 MG/ML IV SOLN
2.0000 mg | Freq: Once | INTRAVENOUS | Status: AC
  Administered 2015-10-02: 2 mg via INTRAVENOUS
  Filled 2015-10-02: qty 1

## 2015-10-02 NOTE — ED Provider Notes (Signed)
CSN: [id number]     Arrival date & time 10/02/15  1034 History   First MD Initiated Contact with Patient 10/02/15 1035     Chief Complaint  Patient presents with  . Emesis     (Consider location/radiation/quality/duration/timing/severity/associated sxs/prior Treatment) HPI Comments: Patient with no past abdominal surgical history, history of left lower extremity amputation due to peripheral vascular disease, coronary artery disease, cardiomyopathy, TIA, hypertension, diabetes -- presents with onset of abdominal pain 5 days ago. Patient had 2 episodes of vomiting today. She has had poorly formed stools without blood. No urinary symptoms. No fevers, chest pain, or shortness of breath. Patient has not been able to tolerate any solids or liquids including her medications since onset. The onset of this condition was acute. The course is constant. Aggravating factors: none. Alleviating factors: none.    Patient is a 82 y.o. female presenting with vomiting. The history is provided by the patient.  Emesis Associated symptoms: abdominal pain and diarrhea   Associated symptoms: no headaches, no myalgias and no sore throat     Past Medical History  Diagnosis Date  . Coronary artery disease     a. Cath 09/2010 - med rx.  Marland Kitchen TIA (transient ischemic attack)   . Hypertension   . Shingles   . Stroke (HCC)   . Anemia   . Cardiomyopathy, idiopathic (HCC) 02/08/2011  . Myocardial infarct (HCC)     x 3 unsure of years  . Irregular heartbeat   . Ulcer   . GERD (gastroesophageal reflux disease)   . Hx of transient ischemic attack (TIA)   . Memory loss   . Chronic diastolic CHF (congestive heart failure) (HCC)     Takes Lasix  . Gout     takes allopurinol  . Peripheral vascular disease (HCC)     a. s/p L BKA.  . Diabetes mellitus     type 2 NIDDM  . Chronic kidney disease (CKD), stage IV (severe) (HCC)   . Arthritis   . Hypothyroidism     (SEVERE) Takes Levothryroxine  . Nonischemic  cardiomyopathy (HCC)     EF now is 55%, reduced due to myxedema, which is improved.  . Dyslipidemia   . Peripheral neuropathy (HCC)   . H/O echocardiogram 09/06/11    Indication- nonIschemic Cardiomyopathy. EF = now greater than 55% with no regional wall motion abnormalities. Tthere is mild to moderate trisuspid regurgitayion and mild pulmonary hypertension with an RVSP of 35 mmHg as well as stage 1 diastolic dysfunction and mild to moderate LVH.  Marland Kitchen Abnormal nuclear stress test 06/01/09    Demonstrated a new area of infarct scar, peri-infarct ischemia seen in the inferolateral territory. EF eas normal at 70% with mild hypocontractility at the apex, distal inferolateral wall.   Past Surgical History  Procedure Laterality Date  . Back surgery      [facility]  . Eye surgery      Left eye surgery; cataract removal  . Left bka  07/05/2011  . Av fistula placement    . Amputation  07/05/2011    Procedure: AMPUTATION BELOW KNEE;  Surgeon: Chuck Hint, MD;  Location: [facility] OR;  Service: Vascular;  Laterality: Left;  . Angioplasty  1988  . Cardiac catheterization  09/28/07    Demonstrated multiple sequential lesions around 40 to 30% in the RCA territory.  . Left lower extremity venous duplex Left 06/27/11    Summary: No evidence of DVT involving the left lower extremity and right  common femoral vein.   . Lower extremity arterial evaluation  06/27/11    SUMMARY: Right: ABI not ascertained due to false elevation in BP secondary to calcification (posterior tibial artery is non compressible). Left: ABI indicates moderate reduction in arterial flow. Bilateral: Great toe PPG waveforms indicate adequate perfusion. Great toe pressures not obtained due to patient's movements secondary to pain.  . Duplex doppler  05/10/11    LE arterial dopplers demonstrate bilaterally reduced ABIs of 0.91 on right & 0.56 on left. She does report some decreased pain on the left, & there's moderate mixed-density plaque  in the right SFA w/50 to 69% reduction. There's a 69% reduction in the left SFA & does appear to be occlusive disease of left posterior tibial artery. Right posterior dorsalis pedis artery demonstrates occlusive disease  . Insertion of dialysis catheter N/A 01/06/2013    Procedure: INSERTION OF DIALYSIS CATHETER;  Surgeon: Larina Earthly, MD;  Location: [facility] OR;  Service: Vascular;  Laterality: N/A;  . Av fistula placement Right 12/24/2013    Procedure: INSERTION OF ARTERIOVENOUS (AV) GORE-TEX GRAFT ARM;  Surgeon: Chuck Hint, MD;  Location: [facility] OR;  Service: Vascular;  Laterality: Right;  . Lower extremity angiogram N/A 10/31/2011    Procedure: LOWER EXTREMITY ANGIOGRAM;  Surgeon: Chuck Hint, MD;  Location: [facility] CATH LAB;  Service: Cardiovascular;  Laterality: N/A;  . Shuntogram N/A 03/17/2014    Procedure: Betsey Amen;  Surgeon: Fransisco Hertz, MD;  Location: [facility] CATH LAB;  Service: Cardiovascular;  Laterality: N/A;  . Ligation arteriovenous gortex graft Right 03/25/2014    Procedure: LIGATION ARTERIOVENOUS GORTEX GRAFT;  Surgeon: Chuck Hint, MD;  Location: [facility] OR;  Service: Vascular;  Laterality: Right;  . Esophagogastroduodenoscopy N/A 04/15/2014    Procedure: ESOPHAGOGASTRODUODENOSCOPY (EGD);  Surgeon: Rachael Fee, MD;  Location: [facility] ENDOSCOPY;  Service: Endoscopy;  Laterality: N/A;   Family History  Problem Relation Age of Onset  . Cancer Sister     STOMACH  . Diabetes Sister   . Cancer Brother     BONE  . Diabetes Brother   . Anesthesia problems Neg Hx   . Hypotension Neg Hx   . Malignant hyperthermia Neg Hx   . Pseudochol deficiency Neg Hx   . Hyperlipidemia Daughter   . Hypertension Daughter   . Heart disease Daughter     before age 55  . Kidney disease Daughter   . Other Daughter     varicose veins  . Diabetes Daughter   . Heart disease Son     before age 24  . Hyperlipidemia Son   . Hypertension Son   . Heart attack Son   . Heart attack Daughter    . Heart disease Daughter     Before age 50   Social History  Substance Use Topics  . Smoking status: Former Smoker -- 0.50 packs/day for 40 years    Quit date: 07/05/1986  . Smokeless tobacco: Former Neurosurgeon     Comment: quit smoking 1988  . Alcohol Use: No   OB History    No data available     Review of Systems  Constitutional: Negative for fever.  HENT: Negative for rhinorrhea and sore throat.   Eyes: Negative for redness.  Respiratory: Negative for cough.   Cardiovascular: Negative for chest pain.  Gastrointestinal: Positive for nausea, vomiting, abdominal pain and diarrhea. Negative for constipation and blood in stool.  Genitourinary: Negative for dysuria.  Musculoskeletal: Negative for myalgias.  Skin: Negative for  rash.  Neurological: Negative for headaches.    Allergies  Aspirin  Home Medications   Prior to Admission medications   Medication Sig Start Date End Date Taking? Authorizing Provider  acetaminophen (TYLENOL) 325 MG tablet Take 650 mg by mouth every 6 (six) hours as needed for mild pain.    Historical Provider, MD  albuterol-ipratropium (COMBIVENT) 18-103 MCG/ACT inhaler Inhale 2 puffs into the lungs at bedtime.     Historical Provider, MD  allopurinol (ZYLOPRIM) 100 MG tablet Take 200 mg by mouth daily.     Historical Provider, MD  amLODipine (NORVASC) 10 MG tablet Take 10 mg by mouth daily.    Historical Provider, MD  aspirin EC 81 MG tablet Take 81 mg by mouth daily.    Historical Provider, MD  calcitRIOL (ROCALTROL) 0.25 MCG capsule Take 0.25 mcg by mouth every other day.  11/04/12   Historical Provider, MD  clopidogrel (PLAVIX) 75 MG tablet Take 75 mg by mouth every evening.     Historical Provider, MD  docusate sodium (COLACE) 100 MG capsule Take 100 mg by mouth daily as needed for mild constipation.    Historical Provider, MD  furosemide (LASIX) 80 MG tablet TAKE 1 TABLET (80 MG TOTAL) BY MOUTH 2 (TWO) TIMES DAILY. 01/07/15   Chrystie Nose, MD   gabapentin (NEURONTIN) 300 MG capsule Take 300 mg by mouth 3 (three) times daily.    Historical Provider, MD  HYDROcodone-acetaminophen (NORCO/VICODIN) 5-325 MG tablet Take 1-2 tablets by mouth every 6 (six) hours as needed. 06/12/15   Benjiman Core, MD  insulin glargine (LANTUS) 100 UNIT/ML injection Inject 0.22 mLs (22 Units total) into the skin daily. Patient taking differently: Inject 26 Units into the skin daily.  08/23/13   [name], MD  isosorbide mononitrate (IMDUR) 60 MG 24 hr tablet Take 60 mg by mouth daily.    Historical Provider, MD  levothyroxine (SYNTHROID, LEVOTHROID) 75 MCG tablet Take 75 mcg by mouth daily before breakfast.    Historical Provider, MD  nitroGLYCERIN (NITROSTAT) 0.4 MG SL tablet Place 0.4 mg under the tongue every 5 (five) minutes as needed for chest pain.     Historical Provider, MD  pravastatin (PRAVACHOL) 40 MG tablet Take 40 mg by mouth every evening.     Historical Provider, MD  RENVELA 800 MG tablet Take 800 mg by mouth 3 (three) times daily with meals.  07/04/13   Historical Provider, MD   BP 187/93 mmHg  Pulse 68  Temp(Src) 97.7 F (36.5 C) (Oral)  Resp 18  Ht  (1.448 m)  Wt 56.7 kg  BMI 27.04 kg/m2  SpO2 100%   Physical Exam  Constitutional: She appears well-developed and well-nourished.  HENT:  Head: Normocephalic and atraumatic.  Mouth/Throat: Oropharynx is clear and moist.  Eyes: Conjunctivae are normal. Right eye exhibits no discharge. Left eye exhibits no discharge.  Neck: Normal range of motion. Neck supple.  Cardiovascular: Normal rate, regular rhythm and normal heart sounds.   No murmur heard. Pulmonary/Chest: Effort normal and breath sounds normal. No respiratory distress. She has no wheezes. She has no rales.  Abdominal: Soft. She exhibits no distension. There is tenderness (Moderate, generalized). There is no rebound and no guarding.  Neurological: She is alert.  Skin: Skin is warm and dry.  Psychiatric: She has a normal  mood and affect.  Nursing note and vitals reviewed.   ED Course  Procedures (including critical care time) Labs Review Labs Reviewed  COMPREHENSIVE METABOLIC PANEL - Abnormal;  Notable for the following:    Glucose, Bld 214 (*)    BUN 28 (*)    Creatinine, Ser 2.04 (*)    Total Protein 8.4 (*)    AST 54 (*)    GFR calc non Af Amer 20 (*)    GFR calc Af Amer 24 (*)    All other components within normal limits  CBC - Abnormal; Notable for the following:    RDW 15.8 (*)    All other components within normal limits  URINALYSIS, ROUTINE W REFLEX MICROSCOPIC (NOT AT [facility]) - Abnormal; Notable for the following:    APPearance CLOUDY (*)    Glucose, UA 100 (*)    Ketones, ur 15 (*)    Protein, ur >300 (*)    All other components within normal limits  URINE MICROSCOPIC-ADD ON - Abnormal; Notable for the following:    Bacteria, UA FEW (*)    All other components within normal limits  LIPASE, BLOOD  I-STAT CG4 LACTIC ACID, ED  I-STAT TROPOININ, ED    Imaging Review Ct Abdomen Pelvis Wo Contrast  10/02/2015  CLINICAL DATA:  abd pain, n/v. Pt poor historian. Oral contrast only. ED Notes:Patient with one week of abdominal pain and now with nausea and vomiting. Abdominal exam shows a distended belly with faint bowel sounds. Suspect intestinal obstruction. EXAM: CT ABDOMEN AND PELVIS WITHOUT CONTRAST TECHNIQUE: Multidetector CT imaging of the abdomen and pelvis was performed following the standard protocol without IV contrast. COMPARISON:  10/14/2008 FINDINGS: Lung bases: Heart borderline enlarged. Mild lung base scarring and subsegmental atelectasis. Hepatobiliary: Small area of focal fatty infiltration adjacent to the falciform ligament. Liver otherwise unremarkable. There is a small stone along a right central portal triad that may be biliary or vascular. Small dependent gallstone. No acute cholecystitis. No bile duct dilation. Spleen, pancreas, adrenal glands:  Unremarkable. Kidneys, ureters,  bladder: Marked left renal atrophy. Subtle 11 mm low-density right kidney mid to upper pole lesion likely a cyst. No other renal masses or lesions. No stones. No hydronephrosis. Normal ureters. Bladder is unremarkable. Uterus and adnexa:  Unremarkable. Vascular: Dense atherosclerotic calcifications noted along a normal caliber abdominal aorta and its branch vessels. Lymph nodes:  No adenopathy. Ascites:  None. Gastrointestinal: Mild prominence of the proximal small bowel without a discrete transition point. No small bowel wall thickening or mesenteric inflammation. There are few colonic diverticula. No diverticulitis. No other colon abnormality. Normal appendix visualized. Musculoskeletal: Bones are demineralized. Degenerative changes of the lumbar spine. No osteoblastic or osteolytic lesions. IMPRESSION: 1. Mild prominence of the proximal small bowel without a transition point. No convincing obstruction. Findings are nonspecific but likely due to a mild adynamic ileus or gastroenteritis. There are no bowel inflammatory changes. 2. No other evidence of an acute abnormality. 3. Single gallstone.  Atrophic left kidney. Electronically Signed   By: Amie Portland M.D.   On: 10/02/2015 14:09   I have personally reviewed and evaluated these images and lab results as part of my medical decision-making.   EKG Interpretation   Date/Time:  Friday October 02 2015 10:41:08 EDT Ventricular Rate:  65 PR Interval:    QRS Duration: 89 QT Interval:  559 QTC Calculation: 582 R Axis:   -34 Text Interpretation:  Sinus rhythm Probable left atrial enlargement Left  axis deviation Probable anterior infarct, age indeterminate Lateral leads  are also involved Prolonged QT interval t-wave inversions new from prior  Confirmed by ALLEN  MD, ANTHONY ([id number]) on 10/02/2015 11:42:09 AM  11:13 AM Patient seen and examined. Work-up initiated. Medications ordered.   Vital signs reviewed and are as follows: BP 187/93 mmHg   Pulse 68  Temp(Src) 97.7 F (36.5 C) (Oral)  Resp 18  Ht 4\' 9"  (1.448 m)  Wt 56.7 kg  BMI 27.04 kg/m2  SpO2 100%  3:08 PM Patient discussed with and seen by Dr. Freida Busman earlier. Labs and imaging were reassuring. Patient and family member informed of results. They want to go home. Son states that he will attempt to call for a PCP appointment for recheck tomorrow.  The patient was urged to return to the Emergency Department immediately with worsening of current symptoms, worsening abdominal pain, persistent vomiting, blood noted in stools, fever, or any other concerns. The patient verbalized understanding.   3:48 PM prior to discharge, the patient vomited up the oral contrast. She was given ODT Zofran with improvement. I reevaluated the patient. Discussed with patient and son how to proceed. They both state they're comfortable with discharge to home at this time with previous plan. Patient discharged to home with Zofran for symptom control. Patient will try to drink ensure while at home and start taking her blood pressure medications again.  Reiterated, that if patient develops any uncontrolled symptoms including vomiting, worsening pain, fever, chest pain, shortness of breath, new symptoms -- to return to the emergency department to be reevaluated. Patient and son seem reliable to return if symptoms do worsen.    MDM   Final diagnoses:  Non-intractable vomiting with nausea, vomiting of unspecified type  Generalized abdominal pain  Abnormal EKG   N/V: Patient with abdominal pain. Vitals are stable, no fever. Patient has hypertension, no gross end organ damage, likely elevated now due to inability to tolerate her blood pressure medications. Labs are reassuring, creatinine is at her baseline despite vomiting. Imaging performed showing possible adynamic ileus or gastroenteritis. No other significant findings. No signs of dehydration. Lungs are clear and no signs suggestive of PNA. Low concern for  appendicitis, cholecystitis, pancreatitis, ruptured viscus, UTI, kidney stone, aortic dissection, aortic aneurysm or other emergent abdominal etiology. Supportive therapy indicated with return if symptoms worsen.   Abnormal EKG: No active chest pain, shortness of breath. Troponin is negative. Patient has reasonable oxygenation of her symptoms based on CT scan. Do not suspect ACS or anginal equivalent.     Renne Crigler, PA-C 10/02/15 [phone]

## 2015-10-02 NOTE — ED Notes (Signed)
This RN made PA aware pt vomited and zofran was given.

## 2015-10-02 NOTE — ED Notes (Signed)
Pt vomited x 1, given prn nausea medication, will continue to monitor

## 2015-10-02 NOTE — ED Notes (Signed)
This RN made provider aware of pt's blood pressure throughout stay. Pt's son at bedside and states he will "give her the blood pressure medication, Norvasc, when they get back home and that always helps". Pt's son also admits to having a blood pressure machine at home to check blood pressure.

## 2015-10-02 NOTE — ED Notes (Addendum)
Pt arrives from [facility] from home reporting N/V since Monday.  Pt's son reports pt unable to keep food or liquids down.  Pt denies constipation/diarrhea.  AOx3 at baseline.  Radial pulse weak.

## 2015-10-02 NOTE — ED Provider Notes (Signed)
Medical screening examination/treatment/procedure(s) were conducted as a shared visit with non-physician practitioner(s) and myself.  I personally evaluated the patient during the encounter.   EKG Interpretation   Date/Time:  Friday October 02 2015 10:41:08 EDT Ventricular Rate:  65 PR Interval:    QRS Duration: 89 QT Interval:  559 QTC Calculation: 582 R Axis:   -34 Text Interpretation:  Sinus rhythm Probable left atrial enlargement Left  axis deviation Probable anterior infarct, age indeterminate Lateral leads  are also involved Prolonged QT interval t-wave inversions new from prior  Confirmed by Jadrien Narine  MD, Jaquavius Hudler ([id number]) on 10/02/2015 11:42:09 AM     Patient with one week of abdominal pain and now with nausea and vomiting. Abdominal exam shows a distended belly with faint bowel sounds. Suspect intestinal obstruction. Awaiting results of abdominal CT  Lorre Nick, MD 10/02/15 1250

## 2015-10-02 NOTE — ED Notes (Signed)
Pt tolerated oral contrast well.

## 2015-10-02 NOTE — Discharge Instructions (Signed)
Please read and follow all provided instructions.  Your diagnoses today include:  1. Non-intractable vomiting with nausea, vomiting of unspecified type   2. Generalized abdominal pain   3. Abnormal EKG    Tests performed today include:  Blood counts and electrolytes  Blood tests to check liver and kidney function  Blood tests to check pancreas function  Urine test to look for infection  CT exam - shows possible mild intestinal infection, no other serious problems  EKG - is changed from previous EKG  Vital signs. See below for your results today.   Medications prescribed:   Zofran (ondansetron) - for nausea and vomiting  Take any prescribed medications only as directed.  Home care instructions:   Follow any educational materials contained in this packet.  Follow-up instructions: Please follow-up with your primary care provider in the next 1-2 days for further evaluation of your symptoms.    Return instructions:  SEEK IMMEDIATE MEDICAL ATTENTION IF:  The pain does not go away or becomes severe   A temperature above 101F develops   Repeated vomiting occurs (multiple episodes)   The pain becomes localized to portions of the abdomen. The right side could possibly be appendicitis. In an adult, the left lower portion of the abdomen could be colitis or diverticulitis.   Blood is being passed in stools or vomit (bright red or black tarry stools)   You develop chest pain, difficulty breathing, dizziness or fainting, or become confused, poorly responsive, or inconsolable (young children)  If you have any other emergent concerns regarding your health  Additional Information: Abdominal (belly) pain can be caused by many things. Your caregiver performed an examination and possibly ordered blood/urine tests and imaging (CT scan, x-rays, ultrasound). Many cases can be observed and treated at home after initial evaluation in the emergency department. Even though you are being  discharged home, abdominal pain can be unpredictable. Therefore, you need a repeated exam if your pain does not resolve, returns, or worsens. Most patients with abdominal pain don't have to be admitted to the hospital or have surgery, but serious problems like appendicitis and gallbladder attacks can start out as nonspecific pain. Many abdominal conditions cannot be diagnosed in one visit, so follow-up evaluations are very important.  Your vital signs today were: BP 197/84 mmHg   Pulse 69   Temp(Src) 97.7 F (36.5 C) (Oral)   Resp 10   Ht 4\' 9"  (1.448 m)   Wt 56.7 kg   BMI 27.04 kg/m2   SpO2 99% If your blood pressure (bp) was elevated above 135/85 this visit, please have this repeated by your doctor within one month. --------------

## 2015-10-07 ENCOUNTER — Ambulatory Visit (INDEPENDENT_AMBULATORY_CARE_PROVIDER_SITE_OTHER): Payer: Medicare Other | Admitting: Internal Medicine

## 2015-10-07 ENCOUNTER — Encounter: Payer: Self-pay | Admitting: Internal Medicine

## 2015-10-07 VITALS — BP 184/87 | HR 76 | Ht <= 58 in

## 2015-10-07 DIAGNOSIS — N186 End stage renal disease: Secondary | ICD-10-CM | POA: Diagnosis not present

## 2015-10-07 DIAGNOSIS — E785 Hyperlipidemia, unspecified: Secondary | ICD-10-CM

## 2015-10-07 DIAGNOSIS — I110 Hypertensive heart disease with heart failure: Secondary | ICD-10-CM

## 2015-10-07 DIAGNOSIS — R001 Bradycardia, unspecified: Secondary | ICD-10-CM

## 2015-10-07 MED ORDER — HYDRALAZINE HCL 25 MG PO TABS: 25.0000 mg | ORAL_TABLET | Freq: Two times a day (BID) | ORAL | Status: DC

## 2015-10-07 NOTE — Patient Instructions (Signed)
Your physician has recommended you make the following change in your medication: START hydralazine 25mg  twice daily  Your physician recommends that you schedule a follow-up appointment in TWO WEEKS with clinical pharmacist for a blood pressure check  -- if you monitor your BP at home, please bring a list of readings & your BP cuff to your appointment  Your physician wants you to follow-up in: 6 months with Dr. Rennis Golden. You will receive a reminder letter in the mail two months in advance. If you don't receive a letter, please call our office to schedule the follow-up appointment.

## 2015-10-07 NOTE — Progress Notes (Signed)
OFFICE NOTE  Chief Complaint:  No complaints  Primary Care Physician: Georgianne Fick, MD  HPI:  [name] is a pleasant 82 year old female with an unfortunate history of heart failure in the past due to severe hypothyroidism. EF has subsequently normalized. However, she does have significant peripheral arterial disease; underwent a left BKA. When I saw her last on December 13, she was having problems with rest pain in her right foot, and discoloration of the sole of her foot and toes, with tenderness to palpation. I had prescribed her for pain medicine and had reviewed her lower extremity arteriogram with Dr. Allyson Sabal, and felt that there were very few options for revascularization. She returned today and has reported some improvement with reduction in pain, and there does appear to be less discoloration. However, pulses still remain very weak. Her family reports she's also had a mild cough and does seem a little bit more fatigued. She did not report any worsening shortness of breath or orthopnea but has had some difficulty transferring. At her last office visit, her JVP is elevated and there was some trace right lower extremity edema. She did have basilar crackles and appeared to be volume overloaded. I recommended increasing her diuretics and ordered a BMP and BNP. She was to return to the office in 2 weeks for reassessment. Unfortunately she never made that appointment - ultimately I recheck some laboratory work and she was noted to have progressive renal failure. She was ultimately brought to the ED with complaints of worsening SOB and Wheezing. EMS was called to her home and found her O2 sats to be 70% and she was placed on supplemental O2, and had mild improvement. She was found to have findings of CHF, however, her EF on echo was 65-70%. She has acute on chronic renal failure - her fistula could not be accessed and a temporary dialysis catheter was placed. She was dialyzed while in the  hospital and was discharged to rehabilitation. She has not had any followup dialysis and is supposed to be getting weekly test of her renal function being sent to her nephrologist. I do not see where she has an appointment with her nephrologist.  [name] returns today after a number of recent hospitalizations. In October she underwent fistula placement this was complicated by swelling of the arm and it was felt the fistula had to be closed. She now has little if any vascular access. She was then hospitalized for epiglottitis and treated with antibiotics. She continues to be wheezy. All of her wheezes upper airway. Weight is fortunately been stable she's denied any chest pain or worsening heart failure symptoms.   I saw [name] back in the office today. Overall she is without any complaints. She continues to have very poor renal function with a GFR between 15 and 20. She has no dialysis access. She's not had any end-of-life discussions and I briefly spoke with her today about that. She may wish to consider her options, especially DO NOT RESUSCITATE if she has no dialysis access. Given her age and comorbidities this is a very realistic thing to think about.  09/18/2015  [name] returns today for follow-up. Overall she is doing fairly well. She did have a fistula but is not yet on dialysis. Her quality of life is fairly sedentary. An EKG was performed today due to bradycardia and she was noted to be in a junctional rhythm at 48. She is on carvedilol 6.25 mg twice a day due to  her cardiomyopathy. 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  10/07/2015  [name] returns today for follow-up. He reports that she was seen in the ER and was noted to be markedly hypertensive with abdominal discomfort, nausea and vomiting. No adjustments were made to her blood pressure medicines. Troponin was negative however EKG showed new  anterolateral T-wave inversions. In addition her QT interval was prolonged greater than 550 ms. I repeated her EKG today and shows a normal QTC of 453 ms however there are still mild anterolateral T-wave changes. She denies any chest pain. Her appetite has improved. Blood pressure still remains elevated today at 184/87. Heart rate has improved after discontinuing carvedilol as she was noted to be in a junctional rhythm in the 40s. Going forward I would avoid AV nodal blocking medications.  PMHx:  Past Medical History  Diagnosis Date  . Coronary artery disease     a. Cath 09/2010 - med rx.  Marland Kitchen TIA (transient ischemic attack)   . Hypertension   . Shingles   . Stroke (HCC)   . Anemia   . Cardiomyopathy, idiopathic (HCC) 02/08/2011  . Myocardial infarct (HCC)     x 3 unsure of years  . Irregular heartbeat   . Ulcer   . GERD (gastroesophageal reflux disease)   . Hx of transient ischemic attack (TIA)   . Memory loss   . Chronic diastolic CHF (congestive heart failure) (HCC)     Takes Lasix  . Gout     takes allopurinol  . Peripheral vascular disease (HCC)     a. s/p L BKA.  . Diabetes mellitus     type 2 NIDDM  . Chronic kidney disease (CKD), stage IV (severe) (HCC)   . Arthritis   . Hypothyroidism     (SEVERE) Takes Levothryroxine  . Nonischemic cardiomyopathy (HCC)     EF now is 55%, reduced due to myxedema, which is improved.  . Dyslipidemia   . Peripheral neuropathy (HCC)   . H/O echocardiogram 09/06/11    Indication- nonIschemic Cardiomyopathy. EF = now greater than 55% with no regional wall motion abnormalities. Tthere is mild to moderate trisuspid regurgitayion and mild pulmonary hypertension with an RVSP of 35 mmHg as well as stage 1 diastolic dysfunction and mild to moderate LVH.  Marland Kitchen Abnormal nuclear stress test 06/01/09    Demonstrated a new area of infarct scar, peri-infarct ischemia seen in the inferolateral territory. EF eas normal at 70% with mild hypocontractility at the  apex, distal inferolateral wall.    Past Surgical History  Procedure Laterality Date  . Back surgery      [facility]  . Eye surgery      Left eye surgery; cataract removal  . Left bka  07/05/2011  . Av fistula placement    . Amputation  07/05/2011    Procedure: AMPUTATION BELOW KNEE;  Surgeon: Chuck Hint, MD;  Location: [facility] OR;  Service: Vascular;  Laterality: Left;  . Angioplasty  1988  . Cardiac catheterization  09/28/07    Demonstrated multiple sequential lesions around 40 to 30% in the RCA territory.  . Left lower extremity venous duplex Left 06/27/11    Summary: No evidence of DVT involving the left lower extremity and right common femoral vein.   . Lower extremity arterial evaluation  06/27/11    SUMMARY: Right: ABI not ascertained due to false elevation in BP secondary to  calcification (posterior tibial artery is non compressible). Left: ABI indicates moderate reduction in arterial flow. Bilateral: Great toe PPG waveforms indicate adequate perfusion. Great toe pressures not obtained due to patient's movements secondary to pain.  . Duplex doppler  05/10/11    LE arterial dopplers demonstrate bilaterally reduced ABIs of 0.91 on right & 0.56 on left. She does report some decreased pain on the left, & there's moderate mixed-density plaque in the right SFA w/50 to 69% reduction. There's a 69% reduction in the left SFA & does appear to be occlusive disease of left posterior tibial artery. Right posterior dorsalis pedis artery demonstrates occlusive disease  . Insertion of dialysis catheter N/A 01/06/2013    Procedure: INSERTION OF DIALYSIS CATHETER;  Surgeon: Larina Earthly, MD;  Location: [facility] OR;  Service: Vascular;  Laterality: N/A;  . Av fistula placement Right 12/24/2013    Procedure: INSERTION OF ARTERIOVENOUS (AV) GORE-TEX GRAFT ARM;  Surgeon: Chuck Hint, MD;  Location: [facility] OR;  Service: Vascular;  Laterality: Right;  . Lower extremity angiogram N/A 10/31/2011     Procedure: LOWER EXTREMITY ANGIOGRAM;  Surgeon: Chuck Hint, MD;  Location: [facility] CATH LAB;  Service: Cardiovascular;  Laterality: N/A;  . Shuntogram N/A 03/17/2014    Procedure: Betsey Amen;  Surgeon: Fransisco Hertz, MD;  Location: [facility] CATH LAB;  Service: Cardiovascular;  Laterality: N/A;  . Ligation arteriovenous gortex graft Right 03/25/2014    Procedure: LIGATION ARTERIOVENOUS GORTEX GRAFT;  Surgeon: Chuck Hint, MD;  Location: [facility] OR;  Service: Vascular;  Laterality: Right;  . Esophagogastroduodenoscopy N/A 04/15/2014    Procedure: ESOPHAGOGASTRODUODENOSCOPY (EGD);  Surgeon: Rachael Fee, MD;  Location: [facility] ENDOSCOPY;  Service: Endoscopy;  Laterality: N/A;    FAMHx:  Family History  Problem Relation Age of Onset  . Cancer Sister     STOMACH  . Diabetes Sister   . Cancer Brother     BONE  . Diabetes Brother   . Anesthesia problems Neg Hx   . Hypotension Neg Hx   . Malignant hyperthermia Neg Hx   . Pseudochol deficiency Neg Hx   . Hyperlipidemia Daughter   . Hypertension Daughter   . Heart disease Daughter     before age 60  . Kidney disease Daughter   . Other Daughter     varicose veins  . Diabetes Daughter   . Heart disease Son     before age 52  . Hyperlipidemia Son   . Hypertension Son   . Heart attack Son   . Heart attack Daughter   . Heart disease Daughter     Before age 30    SOCHx:   reports that she quit smoking about 29 years ago. She has quit using smokeless tobacco. She reports that she does not drink alcohol or use illicit drugs.  ALLERGIES:  Allergies  Allergen Reactions  . Aspirin Nausea And Vomiting    325 mg (adult strength) [name]     ROS: Pertinent items noted in HPI and remainder of comprehensive ROS otherwise negative.  HOME MEDS: Current Outpatient Prescriptions  Medication Sig Dispense Refill  . acetaminophen (TYLENOL) 325 MG tablet Take 650 mg by mouth every 6  (six) hours as needed for mild pain.    Marland Kitchen albuterol-ipratropium (COMBIVENT) 18-103 MCG/ACT inhaler Inhale 2 puffs into the lungs at bedtime.     Marland Kitchen allopurinol (ZYLOPRIM) 100 MG tablet Take 200 mg by mouth daily.     Marland Kitchen  amLODipine (NORVASC) 10 MG tablet Take 10 mg by mouth daily.    Marland Kitchen aspirin EC 81 MG tablet Take 81 mg by mouth daily.    . calcitRIOL (ROCALTROL) 0.25 MCG capsule Take 0.25 mcg by mouth every other day.     . Calcium Carbonate-Vitamin D (CALCIUM-VITAMIN D) 500-200 MG-UNIT tablet Take 1 tablet by mouth daily.    . clopidogrel (PLAVIX) 75 MG tablet Take 75 mg by mouth every evening.     . docusate sodium (COLACE) 100 MG capsule Take 100 mg by mouth daily as needed for mild constipation.    . furosemide (LASIX) 80 MG tablet TAKE 1 TABLET (80 MG TOTAL) BY MOUTH 2 (TWO) TIMES DAILY. 60 tablet 11  . gabapentin (NEURONTIN) 300 MG capsule Take 300 mg by mouth 3 (three) times daily.    Marland Kitchen HYDROcodone-acetaminophen (NORCO/VICODIN) 5-325 MG tablet Take 1-2 tablets by mouth every 6 (six) hours as needed. 10 tablet 0  . insulin glargine (LANTUS) 100 UNIT/ML injection Inject 0.22 mLs (22 Units total) into the skin daily. (Patient taking differently: Inject 26 Units into the skin daily. ) 10 mL 11  . isosorbide mononitrate (IMDUR) 60 MG 24 hr tablet Take 60 mg by mouth daily.    Marland Kitchen levothyroxine (SYNTHROID, LEVOTHROID) 75 MCG tablet Take 75 mcg by mouth daily before breakfast.    . nitroGLYCERIN (NITROSTAT) 0.4 MG SL tablet Place 0.4 mg under the tongue every 5 (five) minutes as needed for chest pain.     Marland Kitchen ondansetron (ZOFRAN ODT) 4 MG disintegrating tablet Take 1 tablet (4 mg total) by mouth every 8 (eight) hours as needed for nausea or vomiting. 10 tablet 0  . pravastatin (PRAVACHOL) 40 MG tablet Take 40 mg by mouth every evening.     Marland Kitchen RENVELA 800 MG tablet Take 800 mg by mouth 3 (three) times daily with meals.     . hydrALAZINE (APRESOLINE) 25 MG tablet Take 1 tablet (25 mg total) by mouth 2  (two) times daily. 180 tablet 0   No current facility-administered medications for this visit.    LABS/IMAGING: No results found for this or any previous visit (from the past 48 hour(s)). No results found.  VITALS: BP 184/87 mmHg  Pulse 76  Ht 4\' 9"  (1.448 m)  Wt   EXAM: General appearance: alert and no distress Neck: JVD flat, no carotid bruits, upper airway wheeze Lungs: diminished breath sounds bilaterally, faint basilar crackles Heart: regular rate and rhythm Abdomen: soft, mildly protuberant Extremities: no edema on the right leg, left is BKA Pulses: faint pulse in the right foot Skin: Skin color, texture, turgor normal. No rashes or lesions Neurologic: Mental status: Alert, oriented, thought content appropriate  EKG: Normal sinus rhythm at 78, QTC 4 and 53 ms, inferolateral and lateral T-wave inversions  ASSESSMENT: 1. Symptomatic junctional bradycardia - HR improved off of b-blocker 2. Chronic systolic heart failure 3. Acute on chronic kidney disease stage V-hemodialysis catheter in place 4. PAD with left BKA (wheelchair bound) 5. History of severe hypothyroidism with non-ischemic cardiomyopathy, EF improved to 65-70% 6. ESRD, not on HD - fistula in place  PLAN: 1.   Mrs. [name] has had an improved heart rate off of beta blocker but does not report any change in her fatigue symptoms. In the interim she was seen for abdominal discomfort. She does have some mild EKG changes which have improved and may been related to nausea/vomiting or uncontrolled hypertension. Blood pressure still remains elevated despite recent increase of  amlodipine to 10 mg by her nephrologist. I like to add hydralazine 25 mg twice a day to her regimen and will arrange for her hypertension clinic follow-up in a few weeks to titrate the medication as necessary.  Follow-up with me in 6 months.  Chrystie Nose, MD, [facility] Attending Cardiologist [facility]  [name] 10/07/2015, 11:09  AM

## 2015-10-09 ENCOUNTER — Telehealth: Payer: Self-pay | Admitting: Internal Medicine

## 2015-10-09 NOTE — Telephone Encounter (Signed)
Spoke with Diane--pt's occupational therapist. She is seeing pt today and heart rate is 108-112 at rest. Pt not complaining of palpitations or problems with heart rate. Chart reviewed and beta blocker was stopped due to symptomatic junctional bradycardia.  Diane reports pt does not want to continue with occupational therapy and they are looking into home health nurse visits. I told her I would forward heart rate readings to Dr. Rennis Golden and we would call pt if he wanted to make any changes.

## 2015-10-09 NOTE — Telephone Encounter (Signed)
New Message  OT from [facility] calling to speak w/ RN- concerning pt inc HR.Marland Kitchen Please call back and discuss.

## 2015-10-10 NOTE — Telephone Encounter (Signed)
I question if the fast heart rate is a-fib. Probably should have an EKG if it remains elevated to evaluate. If so, we may not have many rate control options other than considering a pacemaker.  Dr. Rennis Golden

## 2015-10-12 NOTE — Telephone Encounter (Signed)
Spoke with diane, she reports they are no longer seeing the pt. Spoke with pt son, he has not check her heart rate yet, he reports the pt feels fine. Explained he needs to check her heart rate and if elevated to call the office. The pt will need ECG. Son voiced understanding.

## 2015-10-22 ENCOUNTER — Encounter: Payer: Self-pay | Admitting: Pharmacist

## 2015-10-22 ENCOUNTER — Ambulatory Visit (INDEPENDENT_AMBULATORY_CARE_PROVIDER_SITE_OTHER): Payer: Medicare Other | Admitting: Pharmacist

## 2015-10-22 DIAGNOSIS — I110 Hypertensive heart disease with heart failure: Secondary | ICD-10-CM

## 2015-10-22 MED ORDER — HYDRALAZINE HCL 50 MG PO TABS
50.0000 mg | ORAL_TABLET | Freq: Two times a day (BID) | ORAL | 1 refills | Status: DC
Start: 2015-10-22 — End: 2016-08-16

## 2015-10-22 MED ORDER — AMLODIPINE BESYLATE 10 MG PO TABS: 10.0000 mg | ORAL_TABLET | Freq: Every day | ORAL | 1 refills | Status: DC

## 2015-10-22 NOTE — Patient Instructions (Signed)
Return for a a follow up appointment in 4 weeks  Your blood pressure today is 158/92   Check your blood pressure at home daily (if able) and keep record of the readings.  Take your BP meds as follows: Increase hydralazine to 50mg  twice a day (she may take 2 of the 25mg  tablets twice a day until her current supply runs out)  Bring all of your meds, your BP cuff and your record of home blood pressures to your next appointment.  Exercise as you're able, try to walk approximately 30 minutes per day.  Keep salt intake to a minimum, especially watch canned and prepared boxed foods.  Eat more fresh fruits and vegetables and fewer canned items.  Avoid eating in fast food restaurants.    HOW TO TAKE YOUR BLOOD PRESSURE: . Rest 5 minutes before taking your blood pressure. .  Don't smoke or drink caffeinated beverages for at least 30 minutes before. . Take your blood pressure before (not after) you eat. . Sit comfortably with your back supported and both feet on the floor (don't cross your legs). . Elevate your arm to heart level on a table or a desk. . Use the proper sized cuff. It should fit smoothly and snugly around your bare upper arm. There should be enough room to slip a fingertip under the cuff. The bottom edge of the cuff should be 1 inch above the crease of the elbow. . Ideally, take 3 measurements at one sitting and record the average.

## 2015-10-22 NOTE — Progress Notes (Signed)
Patient ID: [name]                 DOB: 06-Feb-1924                      MRN: [id number]     HPI: [name] is a 82 y.o. female patient of Dr. Rennis Golden with PMH below who presents today with her son for hypertension evaluation. She has had issues in the past with low heart rate and Dr. Rennis Golden would like to avoid AV nodal blocking agents. At her last visit she was started on hydralazine 25mg  BID.  She reports feeling well since medication changes. She denies symptoms of low or high blood pressure. Her son states the pharmacy has continued to fill her amlodipine for the 5 mg tablets. He also reports that his sister has been filling pt pill box and he recently discovered that she was only putting one hydralazine dose in per day. This was corrected earlier this week.   She is not currently on dialysis and states they are working to keep her from going to dialysis.   Cardiac Hx: CAD, cardiomyopathy, PVD, CHF, HTN, bradycardia, CKD  Current HTN meds:  Amlodipine 10mg  daily Hydralazine 25mg  BID Furosemide 80mg  once daily Isosorbide mononitrate 60mg  daily  Previously tried: carvedilol - low HR  BP goal: <150/90  Family History: no cardiac history that she is aware of  Social History: Quit smoking in 1988. Denies alcohol.   Diet: Eats most of her meals at home. She endorses occasionally eating fried foods. Her son does not cook with salt and she denies adding salt to her meals. She rarely drinks coffee or tea. She does however drink at least one can of coke/pepsi per day which is down from 3-4 cans.   Exercise: Limited. She was doing home OT/PT.   Home BP readings: Most of her readings are from before starting hydralazine and her son and she use the same cuff so it is hard to determine which readings are hers versus his.   He states he took it recently since starting hydralazine and recalls it being 128/83.   I also question the validity of their cuff because her pressures were  running in the 140s-150s prior to starting hydralazine and 180s since starting hydralazine.   Wt Readings from Last 3 Encounters:  10/02/15 125 lb (56.7 kg)  09/02/15 130 lb (59 kg)  06/12/15 120 lb (54.4 kg)   BP Readings from Last 3 Encounters:  10/22/15 (!) 158/92  10/07/15 (!) 184/87  10/02/15 200/94   Pulse Readings from Last 3 Encounters:  10/22/15 95  10/07/15 76  10/02/15 76    Renal function: CrCl cannot be calculated (Unknown ideal weight.).  Past Medical History:  Diagnosis Date  . Abnormal nuclear stress test 06/01/09   Demonstrated a new area of infarct scar, peri-infarct ischemia seen in the inferolateral territory. EF eas normal at 70% with mild hypocontractility at the apex, distal inferolateral wall.  . Anemia   . Arthritis   . Cardiomyopathy, idiopathic (HCC) 02/08/2011  . Chronic diastolic CHF (congestive heart failure) (HCC)    Takes Lasix  . Chronic kidney disease (CKD), stage IV (severe) (HCC)   . Coronary artery disease    a. Cath 09/2010 - med rx.  . Diabetes mellitus    type 2 NIDDM  . Dyslipidemia   . GERD (gastroesophageal reflux disease)   . Gout    takes allopurinol  .  H/O echocardiogram 09/06/11   Indication- nonIschemic Cardiomyopathy. EF = now greater than 55% with no regional wall motion abnormalities. Tthere is mild to moderate trisuspid regurgitayion and mild pulmonary hypertension with an RVSP of 35 mmHg as well as stage 1 diastolic dysfunction and mild to moderate LVH.  Marland Kitchen Hx of transient ischemic attack (TIA)   . Hypertension   . Hypothyroidism    (SEVERE) Takes Levothryroxine  . Irregular heartbeat   . Memory loss   . Myocardial infarct (HCC)    x 3 unsure of years  . Nonischemic cardiomyopathy (HCC)    EF now is 55%, reduced due to myxedema, which is improved.  . Peripheral neuropathy (HCC)   . Peripheral vascular disease (HCC)    a. s/p L BKA.  . Shingles   . Stroke (HCC)   . TIA (transient ischemic attack)   . Ulcer      Current Outpatient Prescriptions on File Prior to Visit  Medication Sig Dispense Refill  . acetaminophen (TYLENOL) 325 MG tablet Take 650 mg by mouth every 6 (six) hours as needed for mild pain.    Marland Kitchen albuterol-ipratropium (COMBIVENT) 18-103 MCG/ACT inhaler Inhale 2 puffs into the lungs at bedtime.     Marland Kitchen allopurinol (ZYLOPRIM) 100 MG tablet Take 200 mg by mouth daily.     Marland Kitchen aspirin EC 81 MG tablet Take 81 mg by mouth daily.    . calcitRIOL (ROCALTROL) 0.25 MCG capsule Take 0.25 mcg by mouth every other day.     . Calcium Carbonate-Vitamin D (CALCIUM-VITAMIN D) 500-200 MG-UNIT tablet Take 1 tablet by mouth daily.    . clopidogrel (PLAVIX) 75 MG tablet Take 75 mg by mouth every evening.     . docusate sodium (COLACE) 100 MG capsule Take 100 mg by mouth daily as needed for mild constipation.    . furosemide (LASIX) 80 MG tablet TAKE 1 TABLET (80 MG TOTAL) BY MOUTH 2 (TWO) TIMES DAILY. 60 tablet 11  . gabapentin (NEURONTIN) 300 MG capsule Take 300 mg by mouth 3 (three) times daily.    Marland Kitchen HYDROcodone-acetaminophen (NORCO/VICODIN) 5-325 MG tablet Take 1-2 tablets by mouth every 6 (six) hours as needed. 10 tablet 0  . insulin glargine (LANTUS) 100 UNIT/ML injection Inject 0.22 mLs (22 Units total) into the skin daily. (Patient taking differently: Inject 26 Units into the skin daily. ) 10 mL 11  . isosorbide mononitrate (IMDUR) 60 MG 24 hr tablet Take 60 mg by mouth daily.    Marland Kitchen levothyroxine (SYNTHROID, LEVOTHROID) 75 MCG tablet Take 75 mcg by mouth daily before breakfast.    . pravastatin (PRAVACHOL) 40 MG tablet Take 40 mg by mouth every evening.     Marland Kitchen RENVELA 800 MG tablet Take 800 mg by mouth 3 (three) times daily with meals.     . nitroGLYCERIN (NITROSTAT) 0.4 MG SL tablet Place 0.4 mg under the tongue every 5 (five) minutes as needed for chest pain.     Marland Kitchen ondansetron (ZOFRAN ODT) 4 MG disintegrating tablet Take 1 tablet (4 mg total) by mouth every 8 (eight) hours as needed for nausea or  vomiting. (Patient not taking: Reported on 10/22/2015) 10 tablet 0   No current facility-administered medications on file prior to visit.     Allergies  Allergen Reactions  . Aspirin Nausea And Vomiting    325 mg (adult strength) [name]     Blood pressure (!) 158/92, pulse 95.  Assessment/Plan: Hypertension: BP is not at goal today but is an improvement from her previous BP in office. Will increase her hydralazine to  twice daily. Follow-up in hypertension clinic in 4 weeks for additional medication titration.   Thank you, Freddie Apley. Cleatis Polka, PharmD  [facility]  10/22/2015 2:21 PM

## 2015-11-08 ENCOUNTER — Inpatient Hospital Stay (HOSPITAL_COMMUNITY)
Admission: EM | Admit: 2015-11-08 | Discharge: 2015-11-13 | DRG: 392 | Disposition: A | Discharge status: Home Health | Payer: Medicare Other | Attending: Internal Medicine | Admitting: Internal Medicine

## 2015-11-08 ENCOUNTER — Emergency Department (HOSPITAL_COMMUNITY): Payer: Medicare Other

## 2015-11-08 ENCOUNTER — Encounter (HOSPITAL_COMMUNITY): Payer: Self-pay

## 2015-11-08 DIAGNOSIS — M109 Gout, unspecified: Secondary | ICD-10-CM | POA: Diagnosis present

## 2015-11-08 DIAGNOSIS — Z89512 Acquired absence of left leg below knee: Secondary | ICD-10-CM

## 2015-11-08 DIAGNOSIS — Z87891 Personal history of nicotine dependence: Secondary | ICD-10-CM

## 2015-11-08 DIAGNOSIS — Z794 Long term (current) use of insulin: Secondary | ICD-10-CM

## 2015-11-08 DIAGNOSIS — Z6822 Body mass index (BMI) 22.0-22.9, adult: Secondary | ICD-10-CM

## 2015-11-08 DIAGNOSIS — R1310 Dysphagia, unspecified: Secondary | ICD-10-CM

## 2015-11-08 DIAGNOSIS — K224 Dyskinesia of esophagus: Secondary | ICD-10-CM | POA: Diagnosis not present

## 2015-11-08 DIAGNOSIS — R402362 Coma scale, best motor response, obeys commands, at arrival to emergency department: Secondary | ICD-10-CM | POA: Diagnosis present

## 2015-11-08 DIAGNOSIS — E860 Dehydration: Secondary | ICD-10-CM | POA: Diagnosis present

## 2015-11-08 DIAGNOSIS — Z8673 Personal history of transient ischemic attack (TIA), and cerebral infarction without residual deficits: Secondary | ICD-10-CM

## 2015-11-08 DIAGNOSIS — K219 Gastro-esophageal reflux disease without esophagitis: Secondary | ICD-10-CM | POA: Diagnosis present

## 2015-11-08 DIAGNOSIS — I2510 Atherosclerotic heart disease of native coronary artery without angina pectoris: Secondary | ICD-10-CM | POA: Diagnosis present

## 2015-11-08 DIAGNOSIS — E038 Other specified hypothyroidism: Secondary | ICD-10-CM | POA: Diagnosis present

## 2015-11-08 DIAGNOSIS — I5032 Chronic diastolic (congestive) heart failure: Secondary | ICD-10-CM | POA: Diagnosis present

## 2015-11-08 DIAGNOSIS — E872 Acidosis, unspecified: Secondary | ICD-10-CM | POA: Diagnosis present

## 2015-11-08 DIAGNOSIS — E440 Moderate protein-calorie malnutrition: Secondary | ICD-10-CM | POA: Diagnosis present

## 2015-11-08 DIAGNOSIS — Z7982 Long term (current) use of aspirin: Secondary | ICD-10-CM

## 2015-11-08 DIAGNOSIS — E1151 Type 2 diabetes mellitus with diabetic peripheral angiopathy without gangrene: Secondary | ICD-10-CM | POA: Diagnosis present

## 2015-11-08 DIAGNOSIS — I428 Other cardiomyopathies: Secondary | ICD-10-CM | POA: Diagnosis present

## 2015-11-08 DIAGNOSIS — R402252 Coma scale, best verbal response, oriented, at arrival to emergency department: Secondary | ICD-10-CM | POA: Diagnosis present

## 2015-11-08 DIAGNOSIS — E785 Hyperlipidemia, unspecified: Secondary | ICD-10-CM | POA: Diagnosis present

## 2015-11-08 DIAGNOSIS — R402142 Coma scale, eyes open, spontaneous, at arrival to emergency department: Secondary | ICD-10-CM | POA: Diagnosis present

## 2015-11-08 DIAGNOSIS — I5042 Chronic combined systolic (congestive) and diastolic (congestive) heart failure: Secondary | ICD-10-CM | POA: Diagnosis present

## 2015-11-08 DIAGNOSIS — I252 Old myocardial infarction: Secondary | ICD-10-CM

## 2015-11-08 DIAGNOSIS — N184 Chronic kidney disease, stage 4 (severe): Secondary | ICD-10-CM | POA: Diagnosis present

## 2015-11-08 DIAGNOSIS — I272 Other secondary pulmonary hypertension: Secondary | ICD-10-CM | POA: Diagnosis present

## 2015-11-08 DIAGNOSIS — Z7902 Long term (current) use of antithrombotics/antiplatelets: Secondary | ICD-10-CM

## 2015-11-08 DIAGNOSIS — I130 Hypertensive heart and chronic kidney disease with heart failure and stage 1 through stage 4 chronic kidney disease, or unspecified chronic kidney disease: Secondary | ICD-10-CM | POA: Diagnosis present

## 2015-11-08 DIAGNOSIS — R1084 Generalized abdominal pain: Secondary | ICD-10-CM | POA: Diagnosis present

## 2015-11-08 DIAGNOSIS — I10 Essential (primary) hypertension: Secondary | ICD-10-CM | POA: Diagnosis present

## 2015-11-08 DIAGNOSIS — R112 Nausea with vomiting, unspecified: Secondary | ICD-10-CM | POA: Diagnosis present

## 2015-11-08 DIAGNOSIS — D638 Anemia in other chronic diseases classified elsewhere: Secondary | ICD-10-CM | POA: Diagnosis present

## 2015-11-08 DIAGNOSIS — R627 Adult failure to thrive: Secondary | ICD-10-CM | POA: Diagnosis present

## 2015-11-08 DIAGNOSIS — R1110 Vomiting, unspecified: Secondary | ICD-10-CM | POA: Diagnosis not present

## 2015-11-08 DIAGNOSIS — E039 Hypothyroidism, unspecified: Secondary | ICD-10-CM | POA: Diagnosis present

## 2015-11-08 LAB — COMPREHENSIVE METABOLIC PANEL
ALBUMIN: 3.4 g/dL — AB (ref 3.5–5.0)
ALT: 35 U/L (ref 14–54)
AST: 49 U/L — AB (ref 15–41)
Alkaline Phosphatase: 104 U/L (ref 38–126)
Anion gap: 12 (ref 5–15)
BUN: 30 mg/dL — AB (ref 6–20)
CHLORIDE: 111 mmol/L (ref 101–111)
CO2: 19 mmol/L — AB (ref 22–32)
CREATININE: 1.96 mg/dL — AB (ref 0.44–1.00)
Calcium: 9.4 mg/dL (ref 8.9–10.3)
GFR calc Af Amer: 25 mL/min — ABNORMAL LOW (ref >60)
GFR, EST NON AFRICAN AMERICAN: 21 mL/min — AB (ref >60)
GLUCOSE: 199 mg/dL — AB (ref 65–99)
POTASSIUM: 5.5 mmol/L — AB (ref 3.5–5.1)
Sodium: 142 mmol/L (ref 135–145)
Total Bilirubin: 0.5 mg/dL (ref 0.3–1.2)
Total Protein: 7.7 g/dL (ref 6.5–8.1)

## 2015-11-08 LAB — CBC WITH DIFFERENTIAL/PLATELET
BASOS PCT: 0 %
Basophils Absolute: 0 10*3/uL (ref 0.0–0.1)
EOS ABS: 0.1 10*3/uL (ref 0.0–0.7)
EOS PCT: 2 %
HCT: 33.9 % — ABNORMAL LOW (ref 36.0–46.0)
Hemoglobin: 10.5 g/dL — ABNORMAL LOW (ref 12.0–15.0)
LYMPHS ABS: 1.8 10*3/uL (ref 0.7–4.0)
Lymphocytes Relative: 23 %
MCH: 28.8 pg (ref 26.0–34.0)
MCHC: 31 g/dL (ref 30.0–36.0)
MCV: 93.1 fL (ref 78.0–100.0)
Monocytes Absolute: 0.6 10*3/uL (ref 0.1–1.0)
Monocytes Relative: 8 %
Neutro Abs: 5 10*3/uL (ref 1.7–7.7)
Neutrophils Relative %: 67 %
PLATELETS: 321 10*3/uL (ref 150–400)
RBC: 3.64 MIL/uL — AB (ref 3.87–5.11)
RDW: 15.4 % (ref 11.5–15.5)
WBC: 7.5 10*3/uL (ref 4.0–10.5)

## 2015-11-08 LAB — URINE MICROSCOPIC-ADD ON

## 2015-11-08 LAB — URINALYSIS, ROUTINE W REFLEX MICROSCOPIC
Glucose, UA: 100 mg/dL — AB
Hgb urine dipstick: NEGATIVE
Ketones, ur: NEGATIVE mg/dL
LEUKOCYTES UA: NEGATIVE
NITRITE: NEGATIVE
SPECIFIC GRAVITY, URINE: 1.019 (ref 1.005–1.030)
pH: 6 (ref 5.0–8.0)

## 2015-11-08 LAB — PROTIME-INR
INR: 1.12
PROTHROMBIN TIME: 14.5 s (ref 11.4–15.2)

## 2015-11-08 LAB — CK: CK TOTAL: 9 U/L — AB (ref 38–234)

## 2015-11-08 LAB — I-STAT TROPONIN, ED
TROPONIN I, POC: 0.01 ng/mL (ref 0.00–0.08)
TROPONIN I, POC: 0.01 ng/mL (ref 0.00–0.08)

## 2015-11-08 LAB — GLUCOSE, CAPILLARY: GLUCOSE-CAPILLARY: 145 mg/dL — AB (ref 65–99)

## 2015-11-08 LAB — TSH: TSH: >90 u[IU]/mL — ABNORMAL HIGH (ref 0.350–4.500)

## 2015-11-08 LAB — I-STAT CG4 LACTIC ACID, ED
LACTIC ACID, VENOUS: 1.57 mmol/L (ref 0.5–1.9)
Lactic Acid, Venous: 2.28 mmol/L (ref 0.5–1.9)

## 2015-11-08 LAB — CBG MONITORING, ED
GLUCOSE-CAPILLARY: 231 mg/dL — AB (ref 65–99)
GLUCOSE-CAPILLARY: 155 mg/dL — AB (ref 65–99)

## 2015-11-08 MED ORDER — ACETAMINOPHEN 325 MG PO TABS
650.0000 mg | ORAL_TABLET | Freq: Four times a day (QID) | ORAL | Status: DC | PRN
  Administered 2015-11-11 – 2015-11-12 (×2): 650 mg via ORAL
  Filled 2015-11-08 (×3): qty 2

## 2015-11-08 MED ORDER — SODIUM CHLORIDE 0.9% FLUSH
3.0000 mL | Freq: Two times a day (BID) | INTRAVENOUS | Status: DC
  Administered 2015-11-09 – 2015-11-12 (×6): 3 mL via INTRAVENOUS

## 2015-11-08 MED ORDER — LEVOTHYROXINE SODIUM 100 MCG IV SOLR
37.5000 ug | Freq: Every day | INTRAVENOUS | Status: DC
  Administered 2015-11-08 – 2015-11-09 (×2): 37.5 ug via INTRAVENOUS
  Filled 2015-11-08 (×2): qty 5

## 2015-11-08 MED ORDER — ACETAMINOPHEN 650 MG RE SUPP: 650.0000 mg | Freq: Four times a day (QID) | RECTAL | Status: DC | PRN

## 2015-11-08 MED ORDER — SODIUM CHLORIDE 0.9 % IV SOLN
INTRAVENOUS | Status: DC
  Administered 2015-11-08: 50 mL/h via INTRAVENOUS

## 2015-11-08 MED ORDER — INSULIN ASPART 100 UNIT/ML ~~LOC~~ SOLN
0.0000 [IU] | SUBCUTANEOUS | Status: DC
  Administered 2015-11-08: 2 [IU] via SUBCUTANEOUS
  Administered 2015-11-08: 1 [IU] via SUBCUTANEOUS
  Administered 2015-11-09: 3 [IU] via SUBCUTANEOUS
  Administered 2015-11-09: 1 [IU] via SUBCUTANEOUS
  Administered 2015-11-09 – 2015-11-10 (×3): 2 [IU] via SUBCUTANEOUS
  Filled 2015-11-08: qty 1

## 2015-11-08 MED ORDER — FAMOTIDINE IN NACL 20-0.9 MG/50ML-% IV SOLN
20.0000 mg | Freq: Two times a day (BID) | INTRAVENOUS | Status: DC
  Administered 2015-11-08: 20 mg via INTRAVENOUS
  Filled 2015-11-08: qty 50

## 2015-11-08 MED ORDER — ONDANSETRON HCL 4 MG/2ML IJ SOLN
4.0000 mg | Freq: Once | INTRAMUSCULAR | Status: AC
Start: 2015-11-08 — End: 2015-11-08
  Administered 2015-11-08: 4 mg via INTRAVENOUS
  Filled 2015-11-08: qty 2

## 2015-11-08 MED ORDER — HYDRALAZINE HCL 20 MG/ML IJ SOLN
20.0000 mg | Freq: Four times a day (QID) | INTRAMUSCULAR | Status: DC
  Administered 2015-11-08 – 2015-11-10 (×7): 20 mg via INTRAVENOUS
  Filled 2015-11-08 (×7): qty 1

## 2015-11-08 MED ORDER — NITROGLYCERIN 0.2 MG/HR TD PT24
0.2000 mg | MEDICATED_PATCH | Freq: Every day | TRANSDERMAL | Status: DC
  Administered 2015-11-08 – 2015-11-09 (×2): 0.2 mg via TRANSDERMAL
  Filled 2015-11-08 (×2): qty 1

## 2015-11-08 MED ORDER — ONDANSETRON HCL 4 MG/2ML IJ SOLN
4.0000 mg | Freq: Four times a day (QID) | INTRAMUSCULAR | Status: DC | PRN
  Administered 2015-11-13: 4 mg via INTRAVENOUS
  Filled 2015-11-08: qty 2

## 2015-11-08 MED ORDER — ENOXAPARIN SODIUM 30 MG/0.3ML ~~LOC~~ SOLN
30.0000 mg | SUBCUTANEOUS | Status: DC
  Administered 2015-11-08 – 2015-11-12 (×5): 30 mg via SUBCUTANEOUS
  Filled 2015-11-08 (×5): qty 0.3

## 2015-11-08 MED ORDER — SODIUM CHLORIDE 0.9 % IV BOLUS (SEPSIS)
1000.0000 mL | Freq: Once | INTRAVENOUS | Status: AC
  Administered 2015-11-08: 1000 mL via INTRAVENOUS

## 2015-11-08 MED ORDER — FAMOTIDINE IN NACL 20-0.9 MG/50ML-% IV SOLN
20.0000 mg | INTRAVENOUS | Status: DC
  Administered 2015-11-09 – 2015-11-11 (×3): 20 mg via INTRAVENOUS
  Filled 2015-11-08 (×4): qty 50

## 2015-11-08 NOTE — ED Provider Notes (Signed)
[facility]-EMERGENCY DEPT Provider Note   CSN: [id number] Arrival date & time: 11/08/15  1136     History   Chief Complaint Chief Complaint  Patient presents with  . Emesis  . Weakness  . Urinary Incontinence    HPI [name] is a 82 y.o. female.  HPI   82 yo female with a history of cardiomyopathy chronic kidney disease, coronary artery disease, diabetes, dyslipidemia,, peripheral vascular disease status post left BKA, stroke, TIA, hypothyroidism presents with concern for nausea, vomiting and decreased appetite. Son reports symptoms started on Wednesday with n/v with nearly everything she would eat.  Reports 3-4 episodes per day. She was initially tolerating ensure until yesterday.  She did not want to eat anything yesterday and this AM had such fatigue and generalized weakness that she would not help her son  get her dressed, just wanted to lay in bed with severe fatigue. No vomiting today. Normal stool yesterdya. Passed flatus this AM per son.  Pt denies bloody emesis. Denies abdominal pian, chest pain, dyspnea, cough, fever, headache, numbness or focal weakness.   Past Medical History:  Diagnosis Date  . Abnormal nuclear stress test 06/01/09   Demonstrated a new area of infarct scar, peri-infarct ischemia seen in the inferolateral territory. EF eas normal at 70% with mild hypocontractility at the apex, distal inferolateral wall.  . Anemia   . Arthritis   . Cardiomyopathy, idiopathic (HCC) 02/08/2011  . Chronic diastolic CHF (congestive heart failure) (HCC)    Takes Lasix  . Chronic kidney disease (CKD), stage IV (severe) (HCC)   . Coronary artery disease    a. Cath 09/2010 - med rx.  . Diabetes mellitus    type 2 NIDDM  . Dyslipidemia   . GERD (gastroesophageal reflux disease)   . Gout    takes allopurinol  . H/O echocardiogram 09/06/11   Indication- nonIschemic Cardiomyopathy. EF = now greater than 55% with no regional wall motion abnormalities. Tthere is mild to moderate  trisuspid regurgitayion and mild pulmonary hypertension with an RVSP of 35 mmHg as well as stage 1 diastolic dysfunction and mild to moderate LVH.  Marland Kitchen Hx of transient ischemic attack (TIA)   . Hypertension   . Hypothyroidism    (SEVERE) Takes Levothryroxine  . Irregular heartbeat   . Memory loss   . Myocardial infarct (HCC)    x 3 unsure of years  . Nonischemic cardiomyopathy (HCC)    EF now is 55%, reduced due to myxedema, which is improved.  . Peripheral neuropathy (HCC)   . Peripheral vascular disease (HCC)    a. s/p L BKA.  . Shingles   . Stroke (HCC)   . TIA (transient ischemic attack)   . Ulcer     Patient Active Problem List   Diagnosis Date Noted  . FTT (failure to thrive) in adult 11/08/2015  . Nausea & vomiting 11/08/2015  . Metabolic acidosis 11/08/2015  . Hypertensive heart disease with heart failure (HCC) 10/07/2015  . Junctional bradycardia 09/18/2015  . Atypical chest pain 06/23/2014  . Neck pain   . Food impaction of esophagus   . Epiglottitis 04/13/2014  . Wound disruption, post-op, skin 02/05/2014  . Pre-operative cardiovascular examination 12/13/2013  . Foot pain 09/04/2013  . CKD (chronic kidney disease), stage IV (HCC) 09/04/2013  . Uncontrolled hypertension 08/23/2013  . Malnutrition of moderate degree (HCC) 08/21/2013  . Chronic diastolic heart failure (HCC) 08/15/2013  . Anemia of chronic disease 08/15/2013  . Hyperkalemia 08/15/2013  . Chronic  total occlusion of artery of the extremities (HCC) 02/27/2013  . Dyslipidemia 01/05/2013  . Chronic renal insufficiency, stage IV (severe)- HD started 01/05/13 01/05/2013  . Diabetes mellitus type 2 with peripheral artery disease (HCC) 01/05/2013  . Peripheral vascular disease (HCC) 10/19/2011  . CAD - moderate at cath July 2012 (medical Rx) 02/08/2011  . 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 Hypothyroid 02/08/2011    Past Surgical History:  Procedure Laterality Date  .  AMPUTATION  07/05/2011   Procedure: AMPUTATION BELOW KNEE;  Surgeon: Chuck Hint, MD;  Location: [facility] OR;  Service: Vascular;  Laterality: Left;  . ANGIOPLASTY  1988  . AV FISTULA PLACEMENT    . AV FISTULA PLACEMENT Right 12/24/2013   Procedure: INSERTION OF ARTERIOVENOUS (AV) GORE-TEX GRAFT ARM;  Surgeon: Chuck Hint, MD;  Location: [facility] OR;  Service: Vascular;  Laterality: Right;  . BACK SURGERY     [facility]  . CARDIAC CATHETERIZATION  09/28/07   Demonstrated multiple sequential lesions around 40 to 30% in the RCA territory.  . Duplex doppler  05/10/11   LE arterial dopplers demonstrate bilaterally reduced ABIs of 0.91 on right & 0.56 on left. She does report some decreased pain on the left, & there's moderate mixed-density plaque in the right SFA w/50 to 69% reduction. There's a 69% reduction in the left SFA & does appear to be occlusive disease of left posterior tibial artery. Right posterior dorsalis pedis artery demonstrates occlusive disease  . ESOPHAGOGASTRODUODENOSCOPY N/A 04/15/2014   Procedure: ESOPHAGOGASTRODUODENOSCOPY (EGD);  Surgeon: Rachael Fee, MD;  Location[facility] ENDOSCOPY;  Service: Endoscopy;  Laterality: N/A;  . EYE SURGERY     Left eye surgery; cataract removal  . INSERTION OF DIALYSIS CATHETER N/A 01/06/2013   Procedure: INSERTION OF DIALYSIS CATHETER;  Surgeon: Larina Earthly, MD;  Location: [facility] OR;  Service: Vascular;  Laterality: N/A;  . left bka  07/05/2011  . left lower extremity venous duplex Left 06/27/11   Summary: No evidence of DVT involving the left lower extremity and right common femoral vein.   Marland Kitchen LIGATION ARTERIOVENOUS GORTEX GRAFT Right 03/25/2014   Procedure: LIGATION ARTERIOVENOUS GORTEX GRAFT;  Surgeon: Chuck Hint, MD;  Location: [facility] OR;  Service: Vascular;  Laterality: Right;  . LOWER EXTREMITY ANGIOGRAM N/A 10/31/2011   Procedure: LOWER EXTREMITY ANGIOGRAM;  Surgeon: Chuck Hint, MD;  Location: [facility] CATH LAB;   Service: Cardiovascular;  Laterality: N/A;  . Lower extremity arterial evaluation  06/27/11   SUMMARY: Right: ABI not ascertained due to false elevation in BP secondary to calcification (posterior tibial artery is non compressible). Left: ABI indicates moderate reduction in arterial flow. Bilateral: Great toe PPG waveforms indicate adequate perfusion. Great toe pressures not obtained due to patient's movements secondary to pain.  Marland Kitchen SHUNTOGRAM N/A 03/17/2014   Procedure: Betsey Amen;  Surgeon: Fransisco Hertz, MD;  Location: [facility] CATH LAB;  Service: Cardiovascular;  Laterality: N/A;    OB History    No data available       Home Medications    Prior to Admission medications   Medication Sig Start Date End Date Taking? Authorizing Provider  acetaminophen (TYLENOL) 325 MG tablet Take 650 mg by mouth every 6 (six) hours as needed for mild pain.   Yes Historical Provider, MD  albuterol-ipratropium (COMBIVENT) 18-103 MCG/ACT inhaler Inhale 2 puffs into the lungs at bedtime as needed for wheezing or shortness of breath.    Yes Historical Provider, MD  allopurinol (  ZYLOPRIM) 100 MG tablet Take 200 mg by mouth daily.    Yes Historical Provider, MD  amLODipine (NORVASC) 10 MG tablet Take 1 tablet (10 mg total) by mouth daily. 10/22/15  Yes Chrystie Nose, MD  aspirin EC 81 MG tablet Take 81 mg by mouth every morning.    Yes Historical Provider, MD  calcitRIOL (ROCALTROL) 0.25 MCG capsule Take 0.25 mcg by mouth every other day.  11/04/12  Yes Historical Provider, MD  Calcium Carbonate-Vitamin D (CALCIUM-VITAMIN D) 500-200 MG-UNIT tablet Take 1 tablet by mouth daily.   Yes Historical Provider, MD  clopidogrel (PLAVIX) 75 MG tablet Take 75 mg by mouth every evening.    Yes Historical Provider, MD  docusate sodium (COLACE) 100 MG capsule Take 100 mg by mouth daily as needed for mild constipation.   Yes Historical Provider, MD  furosemide (LASIX) 80 MG tablet TAKE 1 TABLET (80 MG TOTAL) BY MOUTH 2 (TWO) TIMES DAILY.  01/07/15  Yes Chrystie Nose, MD  gabapentin (NEURONTIN) 300 MG capsule Take 300 mg by mouth 3 (three) times daily.   Yes Historical Provider, MD  hydrALAZINE (APRESOLINE) 50 MG tablet Take 1 tablet (50 mg total) by mouth 2 (two) times daily. 10/22/15  Yes Chrystie Nose, MD  HYDROcodone-acetaminophen (NORCO/VICODIN) 5-325 MG tablet Take 1-2 tablets by mouth every 6 (six) hours as needed. Patient taking differently: Take 1-2 tablets by mouth every 6 (six) hours as needed for moderate pain.  06/12/15  Yes Benjiman Core, MD  insulin glargine (LANTUS) 100 UNIT/ML injection Inject 0.22 mLs (22 Units total) into the skin daily. Patient taking differently: Inject 24 Units into the skin every morning.  08/23/13  Yes [name], MD  isosorbide mononitrate (IMDUR) 60 MG 24 hr tablet Take 60 mg by mouth daily.   Yes Historical Provider, MD  levothyroxine (SYNTHROID, LEVOTHROID) 75 MCG tablet Take 75 mcg by mouth daily before breakfast.   Yes Historical Provider, MD  nitroGLYCERIN (NITROSTAT) 0.4 MG SL tablet Place 0.4 mg under the tongue every 5 (five) minutes as needed for chest pain.    Yes Historical Provider, MD  pravastatin (PRAVACHOL) 40 MG tablet Take 40 mg by mouth every evening.    Yes Historical Provider, MD  RENVELA 800 MG tablet Take 800 mg by mouth 3 (three) times daily with meals.  07/04/13  Yes Historical Provider, MD    Family History Family History  Problem Relation Age of Onset  . Heart attack Daughter   . Heart disease Daughter     Before age 33  . Cancer Sister     STOMACH  . Diabetes Sister   . Cancer Brother     BONE  . Diabetes Brother   . Hyperlipidemia Daughter   . Hypertension Daughter   . Heart disease Daughter     before age 85  . Kidney disease Daughter   . Other Daughter     varicose veins  . Diabetes Daughter   . Heart disease Son     before age 96  . Hyperlipidemia Son   . Hypertension Son   . Heart attack Son   . Anesthesia problems Neg Hx   .  Hypotension Neg Hx   . Malignant hyperthermia Neg Hx   . Pseudochol deficiency Neg Hx     Social History Social History  Substance Use Topics  . Smoking status: Former Smoker    Packs/day: 0.50    Years: 40.00    Quit date: 07/05/1986  .  Smokeless tobacco: Former Neurosurgeon     Comment: quit smoking 1988  . Alcohol use No     Allergies   Aspirin   Review of Systems Review of Systems  Constitutional: Positive for appetite change and fatigue. Negative for fever.  HENT: Negative for sore throat.   Eyes: Negative for visual disturbance.  Respiratory: Negative for cough and shortness of breath.   Cardiovascular: Negative for chest pain.  Gastrointestinal: Positive for nausea and vomiting (pt denies symptoms today). Negative for abdominal pain, constipation (normal stool yesterday) and diarrhea.  Genitourinary: Negative for difficulty urinating.  Musculoskeletal: Negative for back pain and neck pain.  Skin: Negative for rash.  Neurological: Positive for weakness (generalized). Negative for syncope, speech difficulty (family denies, pt denies), numbness and headaches.     Physical Exam Updated Vital Signs BP 140/78 (BP Location: Right Arm)   Pulse (!) 107   Temp 98.2 F (36.8 C) (Oral)   Resp 18   Ht 5' (1.524 m)   Wt 125 lb (56.7 kg)   SpO2 99%   BMI 24.41 kg/m   Physical Exam  Constitutional: She appears well-developed and well-nourished. No distress.  HENT:  Head: Normocephalic and atraumatic.  Mouth/Throat: Mucous membranes are pale.  Eyes: Conjunctivae and EOM are normal.  Neck: Normal range of motion.  Cardiovascular: Normal rate, regular rhythm, normal heart sounds and intact distal pulses.  Exam reveals no gallop and no friction rub.   No murmur heard. Pulmonary/Chest: Effort normal and breath sounds normal. No respiratory distress. She has no wheezes. She has no rales.  Abdominal: Soft. She exhibits no distension. There is tenderness (mild diffuse). There is no  guarding.  Musculoskeletal: She exhibits no edema or tenderness.  Neurological: She is alert. She has normal strength. No cranial nerve deficit or sensory deficit. GCS eye subscore is 4. GCS verbal subscore is 5. GCS motor subscore is 6.  Oriented x2 AKA on left, strength equal however bilat LE, bilat UE   Skin: Skin is warm and dry. No rash noted. She is not diaphoretic. No erythema.  Nursing note and vitals reviewed.    ED Treatments / Results  Labs (all labs ordered are listed, but only abnormal results are displayed) Labs Reviewed  COMPREHENSIVE METABOLIC PANEL - Abnormal; Notable for the following:       Result Value   Potassium 5.5 (*)    CO2 19 (*)    Glucose, Bld 199 (*)    BUN 30 (*)    Creatinine, Ser 1.96 (*)    Albumin 3.4 (*)    AST 49 (*)    GFR calc non Af Amer 21 (*)    GFR calc Af Amer 25 (*)    All other components within normal limits  URINALYSIS, ROUTINE W REFLEX MICROSCOPIC (NOT AT [facility]) - Abnormal; Notable for the following:    Glucose, UA 100 (*)    Bilirubin Urine SMALL (*)    Protein, ur >300 (*)    All other components within normal limits  URINE MICROSCOPIC-ADD ON - Abnormal; Notable for the following:    Squamous Epithelial / LPF 0-5 (*)    Bacteria, UA RARE (*)    All other components within normal limits  CBC WITH DIFFERENTIAL/PLATELET - Abnormal; Notable for the following:    RBC 3.64 (*)    Hemoglobin 10.5 (*)    HCT 33.9 (*)    All other components within normal limits  CK - Abnormal; Notable for the following:  Total CK 9 (*)    All other components within normal limits  GLUCOSE, CAPILLARY - Abnormal; Notable for the following:    Glucose-Capillary 145 (*)    All other components within normal limits  I-STAT CG4 LACTIC ACID, ED - Abnormal; Notable for the following:    Lactic Acid, Venous 2.28 (*)    All other components within normal limits  CBG MONITORING, ED - Abnormal; Notable for the following:    Glucose-Capillary 231 (*)      All other components within normal limits  CBG MONITORING, ED - Abnormal; Notable for the following:    Glucose-Capillary 155 (*)    All other components within normal limits  URINE CULTURE  PROTIME-INR  CBC WITH DIFFERENTIAL/PLATELET  HEMOGLOBIN A1C  TSH  BASIC METABOLIC PANEL  CBC  I-STAT TROPOININ, ED  I-STAT TROPOININ, ED  I-STAT CG4 LACTIC ACID, ED  Rosezena SensorI-STAT TROPOININ, ED    EKG  EKG Interpretation  Date/Time:  Sunday November 08 2015 11:45:18 EDT Ventricular Rate:  80 PR Interval:    QRS Duration: 121 QT Interval:  430 QTC Calculation: 497 R Axis:   69 Text Interpretation:  Sinus rhythm Probable left atrial enlargement Right bundle branch block Abnormal T, consider ischemia, diffuse leads TW abnormalities in lateral leads present prior however increased today, new TW abnormality anterior leads, more increased in  inferior leads Confirmed by [facility] MD, Navya Timmons ([id number]) on 11/08/2015 11:53:05 AM       Radiology Dg Abd Portable 1v  Result Date: 11/08/2015 CLINICAL DATA:  Nausea and vomiting. EXAM: PORTABLE ABDOMEN - 1 VIEW COMPARISON:  January 13, 2013 FINDINGS: No bowel obstruction identified. No free air, portal venous gas, or pneumatosis. No acute abnormalities identified. IMPRESSION: No acute abnormalities identified. Electronically Signed   By: Gerome Samavid  Williams III M.D   On: 11/08/2015 17:06    Procedures Procedures (including critical care time)  Medications Ordered in ED Medications  hydrALAZINE (APRESOLINE) injection 20 mg (20 mg Intravenous Given 11/08/15 2155)  nitroGLYCERIN (NITRODUR - Dosed in mg/24 hr) patch 0.2 mg (0.2 mg Transdermal Patch Applied 11/08/15 2058)  insulin aspart (novoLOG) injection 0-9 Units (1 Units Subcutaneous Given 11/08/15 2152)  0.9 %  sodium chloride infusion ( Intravenous Transfusing/Transfer 11/08/15 1851)  ondansetron (ZOFRAN) injection 4 mg (not administered)  enoxaparin (LOVENOX) injection 30 mg (30 mg Subcutaneous Given 11/08/15  2152)  sodium chloride flush (NS) 0.9 % injection 3 mL (not administered)  acetaminophen (TYLENOL) tablet 650 mg (not administered)    Or  acetaminophen (TYLENOL) suppository 650 mg (not administered)  levothyroxine (SYNTHROID, LEVOTHROID) injection 37.5 mcg (not administered)  famotidine (PEPCID) IVPB 20 mg premix (not administered)  sodium chloride 0.9 % bolus 1,000 mL (0 mLs Intravenous Stopped 11/08/15 1700)  ondansetron (ZOFRAN) injection 4 mg (4 mg Intravenous Given 11/08/15 1249)     Initial Impression / Assessment and Plan / ED Course  I have reviewed the triage vital signs and the nursing notes.  Pertinent labs & imaging results that were available during my care of the patient were reviewed by me and considered in my medical decision making (see chart for details).  Clinical Course   82 yo female with a history of cardiomyopathy chronic kidney disease, coronary artery disease, diabetes, dyslipidemia,, peripheral vascular disease status post left BKA, stroke, TIA, hypothyroidism presents with concern for nausea, vomiting and decreased appetite.  Patient with mildly elevated lactic acid, likely secondary to dehydration. Do not detect any source of infection by history or physical exam.  Patient denies any abdominal pain,, and does not have any focal tenderness in my exam and have low suspicion for acute cholecystitis, diverticulitis or appendicitis.  She was having normal stool and passing flatus and doubt obstruction. No HA or hx of head trauma to suggest intracranial cause. Patient also denies any chest pain or shortness of breath. Family reports that in the setting of not eating or drinking, her generalized weakness has become significant. Given signs of dehydration including patient's elevator lactic acid on arrival, and history provided by family, feel patient is appropriate for inpatient hydration for concern of failure to thrive. She also has severe hypertension, however no history to  suggest acute hypertensive emergency.  Final Clinical Impressions(s) / ED Diagnoses   Final diagnoses:  Nausea & vomiting  Dysphagia    New Prescriptions Current Discharge Medication List       Alvira Monday, MD 11/08/15 2220

## 2015-11-08 NOTE — ED Notes (Signed)
Pt CBG, 155. Nurse was notified. 

## 2015-11-08 NOTE — ED Notes (Signed)
Labs attempted x 2 without success. 

## 2015-11-08 NOTE — H&P (Signed)
History and Physical    [name] ZOX:[id number] DOB: [birth year] DOA: 11/08/2015   PCP: Georgianne Fick, MD   Patient coming from/Resides with: Private residence/lives with son  Chief Complaint: Nausea and vomiting 2 days with subsequent generalized abdominal pain  HPI: [name] is a 82 y.o. female with medical history significant for stage IV chronic kidney disease, CAD medical therapy, chronic diastolic heart failure, diabetes, peripheral vascular disease, anemia of chronic disease, dyslipidemia, failure to thrive and moderate malnutrition, history of prior sulfa [place] food impaction, history of bradycardia induced by beta blockers, history of myxedema cardiomyopathy in 2014 who presents to the ER with 2 days of nausea and vomiting and generalized abdominal pain. Patient has been unable to keep down any of her medications and on the first day of symptoms she was not eating and had clear emesis. Today she drank chocolate milk and immediately vomited this back up. She has not had any fevers or chills. She's had loose stools but not any watery diarrhea. She's had no sick contacts. Her last BM was a small amount overnight and this was loose. She was evaluated in the ER on 714 and vomiting and abdominal pain workup at that time was unrevealing including a negative CT of the abdomen and pelvis. Of note in review of outpatient records patient has had difficult to control hypertension and her cardiologist has been adjusting her medications initially adding Norvasc and titrating upward and most recently hydralazine has been added and was titrated up last month. There was also some question in the cardiology notes as to whether patient has been having some tachycardia since initiation of hydralazine.  ED Course:  Vital signs: 97.7-196/102-82-16-room air 99%-weight 125 pounds Lab data: Sodium 142, potassium 5.5 (may be partially hemolyzed since difficulty obtaining labs while in ER), chloride  111, CO2 19, BUN 30, creatinine 1.96, glucose 199, albumin 3.4, AST 49, poc troponin 0.012 collections, initial lactic acid 2.28 but down to 1.57 after fluid challenge in ER, WBC 7500 differential within normal limits, hemoglobin 10.5, platelets 321,000, PT 14.5, INR 1.12, urinalysis unremarkable except for proteinuria reading greater than 300 but otherwise no signs of infection, urine culture obtained in the ER Medications and treatments: Normal saline bolus 1000 mL 1, Zofran 4 mg IV 1  Review of Systems:  In addition to the HPI above,  No Fever-chills, myalgias or other constitutional symptoms No Headache, changes with Vision or hearing, new weakness, tingling, numbness in any extremity, No reported problems swallowing food or Liquids, indigestion/reflux No Chest pain, Cough or Shortness of Breath, palpitations, orthopnea or DOE No melena or hematochezia, no dark tarry stools No dysuria, hematuria or flank pain No new skin rashes, lesions, masses or bruises, No new joints pains-aches No recent weight gain  No polyuria, polydypsia or polyphagia,   Past Medical History:  Diagnosis Date  . Abnormal nuclear stress test 06/01/09   Demonstrated a new area of infarct scar, peri-infarct ischemia seen in the inferolateral territory. EF eas normal at 70% with mild hypocontractility at the apex, distal inferolateral wall.  . Anemia   . Arthritis   . Cardiomyopathy, idiopathic (HCC) 02/08/2011  . Chronic diastolic CHF (congestive heart failure) (HCC)    Takes Lasix  . Chronic kidney disease (CKD), stage IV (severe) (HCC)   . Coronary artery disease    a. Cath 09/2010 - med rx.  . Diabetes mellitus    type 2 NIDDM  . Dyslipidemia   . GERD (gastroesophageal reflux disease)   .  Gout    takes allopurinol  . H/O echocardiogram 09/06/11   Indication- nonIschemic Cardiomyopathy. EF = now greater than 55% with no regional wall motion abnormalities. Tthere is mild to moderate trisuspid regurgitayion  and mild pulmonary hypertension with an RVSP of 35 mmHg as well as stage 1 diastolic dysfunction and mild to moderate LVH.  Marland Kitchen. Hx of transient ischemic attack (TIA)   . Hypertension   . Hypothyroidism    (SEVERE) Takes Levothryroxine  . Irregular heartbeat   . Memory loss   . Myocardial infarct (HCC)    x 3 unsure of years  . Nonischemic cardiomyopathy (HCC)    EF now is 55%, reduced due to myxedema, which is improved.  . Peripheral neuropathy (HCC)   . Peripheral vascular disease (HCC)    a. s/p L BKA.  . Shingles   . Stroke (HCC)   . TIA (transient ischemic attack)   . Ulcer     Past Surgical History:  Procedure Laterality Date  . AMPUTATION  07/05/2011   Procedure: AMPUTATION BELOW KNEE;  Surgeon: Chuck Hinthristopher S Dickson, MD;  [facility];  Service: Vascular;  Laterality: Left;  . ANGIOPLASTY  1988  . AV FISTULA PLACEMENT    . AV FISTULA PLACEMENT Right 12/24/2013   Procedure: INSERTION OF ARTERIOVENOUS (AV) GORE-TEX GRAFT ARM;  Surgeon: Chuck Hinthristopher S Dickson, MD;  [facility];  Service: Vascular;  Laterality: Right;  . BACK SURGERY     [facility]  . CARDIAC CATHETERIZATION  09/28/07   Demonstrated multiple sequential lesions around 40 to 30% in the RCA territory.  . Duplex doppler  05/10/11   LE arterial dopplers demonstrate bilaterally reduced ABIs of 0.91 on right & 0.56 on left. She does report some decreased pain on the left, & there's moderate mixed-density plaque in the right SFA w/50 to 69% reduction. There's a 69% reduction in the left SFA & does appear to be occlusive disease of left posterior tibial artery. Right posterior dorsalis pedis artery demonstrates occlusive disease  . ESOPHAGOGASTRODUODENOSCOPY N/A 04/15/2014   Procedure: ESOPHAGOGASTRODUODENOSCOPY (EGD);  Surgeon: Rachael Feeaniel P Jacobs, MD;  [facility];  Service: Endoscopy;  Laterality: N/A;  . EYE SURGERY     Left eye surgery; cataract removal  . INSERTION OF DIALYSIS CATHETER N/A  01/06/2013   Procedure: INSERTION OF DIALYSIS CATHETER;  Surgeon: Larina Earthlyodd F Early, MD;  [facility];  Service: Vascular;  Laterality: N/A;  . left bka  07/05/2011  . left lower extremity venous duplex Left 06/27/11   Summary: No evidence of DVT involving the left lower extremity and right common femoral vein.   Marland Kitchen. LIGATION ARTERIOVENOUS GORTEX GRAFT Right 03/25/2014   Procedure: LIGATION ARTERIOVENOUS GORTEX GRAFT;  Surgeon: Chuck Hinthristopher S Dickson, MD;  [facility];  Service: Vascular;  Laterality: Right;  . LOWER EXTREMITY ANGIOGRAM N/A 10/31/2011   Procedure: LOWER EXTREMITY ANGIOGRAM;  Surgeon: Chuck Hinthristopher S Dickson, MD;  [facility];  Service: Cardiovascular;  Laterality: N/A;  . Lower extremity arterial evaluation  06/27/11   SUMMARY: Right: ABI not ascertained due to false elevation in BP secondary to calcification (posterior tibial artery is non compressible). Left: ABI indicates moderate reduction in arterial flow. Bilateral: Great toe PPG waveforms indicate adequate perfusion. Great toe pressures not obtained due to patient's movements secondary to pain.  Marland Kitchen. SHUNTOGRAM N/A 03/17/2014   Procedure: Betsey AmenSHUNTOGRAM;  Surgeon: Fransisco HertzBrian L Chen, MD;  [facility];  Service: Cardiovascular;  Laterality: N/A;    Social  History   Social History  . Marital status: Widowed    Spouse name: N/A  . Number of children: 4  . Years of education: N/A   Occupational History  . homemaker    Social History Main Topics  . Smoking status: Former Smoker    Packs/day: 0.50    Years: 40.00    Quit date: 07/05/1986  . Smokeless tobacco: Former Neurosurgeon     Comment: quit smoking 1988  . Alcohol use No  . Drug use: No  . Sexual activity: No   Other Topics Concern  . Not on file   Social History Narrative  . No narrative on file    Mobility: Wheelchair bound Work history: Disabled   Allergies  Allergen Reactions  . Aspirin Nausea And Vomiting    325 mg (adult strength) Patient stated  that she can take the coated aspirin with no problems.     Family History  Problem Relation Age of Onset  . Heart attack Daughter   . Heart disease Daughter     Before age 59  . Cancer Sister     STOMACH  . Diabetes Sister   . Cancer Brother     BONE  . Diabetes Brother   . Hyperlipidemia Daughter   . Hypertension Daughter   . Heart disease Daughter     before age 14  . Kidney disease Daughter   . Other Daughter     varicose veins  . Diabetes Daughter   . Heart disease Son     before age 19  . Hyperlipidemia Son   . Hypertension Son   . Heart attack Son   . Anesthesia problems Neg Hx   . Hypotension Neg Hx   . Malignant hyperthermia Neg Hx   . Pseudochol deficiency Neg Hx     Prior to Admission medications   Medication Sig Start Date End Date Taking? Authorizing Provider  acetaminophen (TYLENOL) 325 MG tablet Take 650 mg by mouth every 6 (six) hours as needed for mild pain.   Yes Historical Provider, MD  albuterol-ipratropium (COMBIVENT) 18-103 MCG/ACT inhaler Inhale 2 puffs into the lungs at bedtime as needed for wheezing or shortness of breath.    Yes Historical Provider, MD  allopurinol (ZYLOPRIM) 100 MG tablet Take 200 mg by mouth daily.    Yes Historical Provider, MD  amLODipine (NORVASC) 10 MG tablet Take 1 tablet (10 mg total) by mouth daily. 10/22/15  Yes Chrystie Nose, MD  aspirin EC 81 MG tablet Take 81 mg by mouth every morning.    Yes Historical Provider, MD  calcitRIOL (ROCALTROL) 0.25 MCG capsule Take 0.25 mcg by mouth every other day.  11/04/12  Yes Historical Provider, MD  Calcium Carbonate-Vitamin D (CALCIUM-VITAMIN D) 500-200 MG-UNIT tablet Take 1 tablet by mouth daily.   Yes Historical Provider, MD  clopidogrel (PLAVIX) 75 MG tablet Take 75 mg by mouth every evening.    Yes Historical Provider, MD  docusate sodium (COLACE) 100 MG capsule Take 100 mg by mouth daily as needed for mild constipation.   Yes Historical Provider, MD  furosemide (LASIX) 80 MG  tablet TAKE 1 TABLET (80 MG TOTAL) BY MOUTH 2 (TWO) TIMES DAILY. 01/07/15  Yes Chrystie Nose, MD  gabapentin (NEURONTIN) 300 MG capsule Take 300 mg by mouth 3 (three) times daily.   Yes Historical Provider, MD  hydrALAZINE (APRESOLINE) 50 MG tablet Take 1 tablet (50 mg total) by mouth 2 (two) times daily. 10/22/15  Yes Iantha Fallen  C Hilty, MD  HYDROcodone-acetaminophen (NORCO/VICODIN) 5-325 MG tablet Take 1-2 tablets by mouth every 6 (six) hours as needed. Patient taking differently: Take 1-2 tablets by mouth every 6 (six) hours as needed for moderate pain.  06/12/15  Yes Benjiman Core, MD  insulin glargine (LANTUS) 100 UNIT/ML injection Inject 0.22 mLs (22 Units total) into the skin daily. Patient taking differently: Inject 24 Units into the skin every morning.  08/23/13  Yes [name], MD  isosorbide mononitrate (IMDUR) 60 MG 24 hr tablet Take 60 mg by mouth daily.   Yes Historical Provider, MD  levothyroxine (SYNTHROID, LEVOTHROID) 75 MCG tablet Take 75 mcg by mouth daily before breakfast.   Yes Historical Provider, MD  nitroGLYCERIN (NITROSTAT) 0.4 MG SL tablet Place 0.4 mg under the tongue every 5 (five) minutes as needed for chest pain.    Yes Historical Provider, MD  pravastatin (PRAVACHOL) 40 MG tablet Take 40 mg by mouth every evening.    Yes Historical Provider, MD  RENVELA 800 MG tablet Take 800 mg by mouth 3 (three) times daily with meals.  07/04/13  Yes Historical Provider, MD    Physical Exam: Vitals:   11/08/15 1445 11/08/15 1545 11/08/15 1615 11/08/15 1645  BP: (!) 199/174 183/85 (!) 194/108 (!) 179/123  Pulse: 78 69 84 81  Resp: 16 19 15 16   Temp:      TempSrc:      SpO2: 98% 98% 99% 99%  Weight:      Height:          Constitutional: NAD, calm, comfortable Eyes: PERRL, lids and conjunctivae normal ENMT: Mucous membranes are dry. Posterior pharynx clear of any exudate or lesions.Normal dentition.  Neck: normal, supple, no masses, no thyromegaly Respiratory: clear to  auscultation bilaterally, no wheezing, no crackles. Normal respiratory effort. No accessory muscle use.  Cardiovascular: Regular rate and rhythm, no murmurs / rubs / gallops. No extremity edema. 2+ pedal pulses. No carotid bruits.  Abdomen: Diffusely but minimally tender without guarding or rebounding, no masses palpated. No hepatosplenomegaly. Bowel sounds positive.  Musculoskeletal: no clubbing / cyanosis. No joint deformity upper and lower extremities. Good ROM, no contractures. Normal muscle tone. Prior left BKA Skin: no rashes, lesions, ulcers. No induration Neurologic: CN 2-12 grossly intact. Sensation intact, DTR normal. Strength 4-5/5 x all 4 extremities.  Psychiatric: Awake and oriented x 3. Normal mood. Somewhat slow to respond but reports just "doesn't feel good"   Labs on Admission: I have personally reviewed following labs and imaging studies  CBC:  Recent Labs Lab 11/08/15 1610  WBC 7.5  NEUTROABS 5.0  HGB 10.5*  HCT 33.9*  MCV 93.1  PLT 321   Basic Metabolic Panel:  Recent Labs Lab 11/08/15 1409  NA 142  K 5.5*  CL 111  CO2 19*  GLUCOSE 199*  BUN 30*  CREATININE 1.96*  CALCIUM 9.4   GFR: Estimated Creatinine Clearance: 15.1 mL/min (by C-G formula based on SCr of 1.96 mg/dL). Liver Function Tests:  Recent Labs Lab 11/08/15 1409  AST 49*  ALT 35  ALKPHOS 104  BILITOT 0.5  PROT 7.7  ALBUMIN 3.4*   No results for input(s): LIPASE, AMYLASE in the last 168 hours. No results for input(s): AMMONIA in the last 168 hours. Coagulation Profile:  Recent Labs Lab 11/08/15 1530  INR 1.12   Cardiac Enzymes: No results for input(s): CKTOTAL, CKMB, CKMBINDEX, TROPONINI in the last 168 hours. BNP (last 3 results) No results for input(s): PROBNP in the last 8760  hours. HbA1C: No results for input(s): HGBA1C in the last 72 hours. CBG:  Recent Labs Lab 11/08/15 1346  GLUCAP 231*   Lipid Profile: No results for input(s): CHOL, HDL, LDLCALC, TRIG,  CHOLHDL, LDLDIRECT in the last 72 hours. Thyroid Function Tests: No results for input(s): TSH, T4TOTAL, FREET4, T3FREE, THYROIDAB in the last 72 hours. Anemia Panel: No results for input(s): VITAMINB12, FOLATE, FERRITIN, TIBC, IRON, RETICCTPCT in the last 72 hours. Urine analysis:    Component Value Date/Time   COLORURINE YELLOW 11/08/2015 1451   APPEARANCEUR CLEAR 11/08/2015 1451   LABSPEC 1.019 11/08/2015 1451   PHURINE 6.0 11/08/2015 1451   GLUCOSEU 100 (A) 11/08/2015 1451   HGBUR NEGATIVE 11/08/2015 1451   BILIRUBINUR SMALL (A) 11/08/2015 1451   KETONESUR NEGATIVE 11/08/2015 1451   PROTEINUR >300 (A) 11/08/2015 1451   UROBILINOGEN 0.2 08/18/2013 1807   NITRITE NEGATIVE 11/08/2015 1451   LEUKOCYTESUR NEGATIVE 11/08/2015 1451   Sepsis Labs: @LABRCNTIP (procalcitonin:4,lacticidven:4) )No results found for this or any previous visit (from the past 240 hour(s)).   Radiological Exams on Admission: Dg Abd Portable 1v  Result Date: 11/08/2015 CLINICAL DATA:  Nausea and vomiting. EXAM: PORTABLE ABDOMEN - 1 VIEW COMPARISON:  January 13, 2013 FINDINGS: No bowel obstruction identified. No free air, portal venous gas, or pneumatosis. No acute abnormalities identified. IMPRESSION: No acute abnormalities identified. Electronically Signed   By: Gerome Sam III M.D   On: 11/08/2015 17:06    EKG: (Independently reviewed) sinus rhythm with underlying right bundle branch block, ventricular rate 80 bpm, QTC 497 ms, chronic inferior- lateral T-wave inversion  Assessment/Plan Principal Problem:   Nausea & vomiting -Patient presents with 2 day history of nausea and vomiting primarily of small amounts of food and all of her medications, this has been followed by abdominal pain was preceded with anorexia -Abdominal films unremarkable -No leukocytosis; urinalysis unremarkable -? Sequelae of metabolic acidosis and uremia (mild -Apparent history of prior esophageal food impaction so could have  either esophageal dysmotility or stricture-SLP evaluation but may obtain more information from esophagram -Allow clears -IV fluids at 50 an hour since unable to eat  Active Problems:   Uncontrolled hypertension -Ongoing issue in the outpatient setting and made worse by inability to keep down home medications -Scheduled IV hydralazine and nitroglycerin patch -No beta blocker secondary to prior symptomatic bradycardia in past    CKD (chronic kidney disease), stage IV  -Renal function stable and at baseline with BUN 30 and creatinine 1.96 -Labs in a.m. -Dr. Darrick Penna is primary nephrologist -Moderate dose Lasix on hold -Preadmission Renvala and Rocaltrol on hold secondary to nausea and vomiting    FTT (failure to thrive) in adult/ Malnutrition of moderate degree  -Suspect may be related to ongoing and possibly progressive chronic kidney disease -Nutrition consultation -Weight has remained stable at about 125 pounds since March of this year and has increased from about 118 pounds since August of last year    Metabolic acidosis -Secondary to chronicity severity of kidney disease and is a new finding and could primarily be related to recent nausea and vomiting although could be the cause of the nausea and vomiting -Anion gap is normal -Presented with mildly elevated lactic acid which normalized after fluid challenge -Obtain CK    CAD - moderate at cath July 2012 (medical Rx) -Currently asymptomatic -No beta blocker secondary to symptomatic bradycardia; due to reports of suspected tachycardia prior to admission that may have been atrial fibrillation (see outpatient cardiology notes) I have requested a  telemetry bed -Recurrent nausea and vomiting so preadmission Plavix and aspirin as well as statin on hold -Have converted Imdur to Nitro-Dur patch for now    Diabetes mellitus type 2 with peripheral artery disease  -On Lantus at home -Follow CBGs and provide SSI -Consider giving half dose  of home Lantus if CBGs remained stable or begin to increase -Hemoglobin A1c    Chronic diastolic heart failure  -Currently compensated without evidence of heart failure -On Lasix 80 mg by mouth twice a day at home -No ARB/ACE I 2/2 kidney disease    Hypothyroid -Convert Synthroid to IV route -Check TSH noting our last available reading was in 2016 and was 30    Dyslipidemia -Statin on hold as above    Anemia of chronic disease -Hemoglobin stable and near baseline of around 10.5      DVT prophylaxis: Lovenox dose adjusted for low creatinine clearance Code Status: Full  Family Communication: Son [name] as well as daughter at bedside Disposition Plan: Anticipate discharge back to preadmission home once medically stable Consults called: None Admission status: Telemetry/observation     Markanthony Gedney L. ANP-BC [facility] Pager [phone]   If 7PM-7AM, please contact night-coverage [email or web address] Password [email or web address]  11/08/2015, 5:22 PM

## 2015-11-08 NOTE — ED Triage Notes (Signed)
Pt arrives EMS with c/o 4 day hx of vomiting, weakness and incontinence. Pt has not taken Eliquis or insulin in 4 days. Increased weakness over last 2 weeks but family states noted as worse at 8 am this morning. Left BKA.

## 2015-11-09 ENCOUNTER — Observation Stay (HOSPITAL_COMMUNITY): Payer: Medicare Other

## 2015-11-09 DIAGNOSIS — I2510 Atherosclerotic heart disease of native coronary artery without angina pectoris: Secondary | ICD-10-CM

## 2015-11-09 DIAGNOSIS — I428 Other cardiomyopathies: Secondary | ICD-10-CM | POA: Diagnosis present

## 2015-11-09 DIAGNOSIS — K219 Gastro-esophageal reflux disease without esophagitis: Secondary | ICD-10-CM | POA: Diagnosis present

## 2015-11-09 DIAGNOSIS — R1084 Generalized abdominal pain: Secondary | ICD-10-CM | POA: Diagnosis present

## 2015-11-09 DIAGNOSIS — E1151 Type 2 diabetes mellitus with diabetic peripheral angiopathy without gangrene: Secondary | ICD-10-CM | POA: Diagnosis present

## 2015-11-09 DIAGNOSIS — Z87891 Personal history of nicotine dependence: Secondary | ICD-10-CM | POA: Diagnosis not present

## 2015-11-09 DIAGNOSIS — R1110 Vomiting, unspecified: Secondary | ICD-10-CM | POA: Diagnosis not present

## 2015-11-09 DIAGNOSIS — E039 Hypothyroidism, unspecified: Secondary | ICD-10-CM | POA: Diagnosis present

## 2015-11-09 DIAGNOSIS — E872 Acidosis: Secondary | ICD-10-CM | POA: Diagnosis present

## 2015-11-09 DIAGNOSIS — I5032 Chronic diastolic (congestive) heart failure: Secondary | ICD-10-CM | POA: Diagnosis present

## 2015-11-09 DIAGNOSIS — N184 Chronic kidney disease, stage 4 (severe): Secondary | ICD-10-CM | POA: Diagnosis present

## 2015-11-09 DIAGNOSIS — R1310 Dysphagia, unspecified: Secondary | ICD-10-CM | POA: Diagnosis not present

## 2015-11-09 DIAGNOSIS — R112 Nausea with vomiting, unspecified: Secondary | ICD-10-CM | POA: Diagnosis present

## 2015-11-09 DIAGNOSIS — E440 Moderate protein-calorie malnutrition: Secondary | ICD-10-CM | POA: Diagnosis present

## 2015-11-09 DIAGNOSIS — Z7902 Long term (current) use of antithrombotics/antiplatelets: Secondary | ICD-10-CM | POA: Diagnosis not present

## 2015-11-09 DIAGNOSIS — I130 Hypertensive heart and chronic kidney disease with heart failure and stage 1 through stage 4 chronic kidney disease, or unspecified chronic kidney disease: Secondary | ICD-10-CM | POA: Diagnosis present

## 2015-11-09 DIAGNOSIS — I272 Other secondary pulmonary hypertension: Secondary | ICD-10-CM | POA: Diagnosis present

## 2015-11-09 DIAGNOSIS — Z7982 Long term (current) use of aspirin: Secondary | ICD-10-CM | POA: Diagnosis not present

## 2015-11-09 DIAGNOSIS — K224 Dyskinesia of esophagus: Secondary | ICD-10-CM | POA: Diagnosis present

## 2015-11-09 DIAGNOSIS — R627 Adult failure to thrive: Secondary | ICD-10-CM | POA: Diagnosis present

## 2015-11-09 DIAGNOSIS — Z89512 Acquired absence of left leg below knee: Secondary | ICD-10-CM | POA: Diagnosis not present

## 2015-11-09 DIAGNOSIS — E785 Hyperlipidemia, unspecified: Secondary | ICD-10-CM | POA: Diagnosis present

## 2015-11-09 DIAGNOSIS — D638 Anemia in other chronic diseases classified elsewhere: Secondary | ICD-10-CM | POA: Diagnosis present

## 2015-11-09 DIAGNOSIS — Z8673 Personal history of transient ischemic attack (TIA), and cerebral infarction without residual deficits: Secondary | ICD-10-CM | POA: Diagnosis not present

## 2015-11-09 DIAGNOSIS — Z794 Long term (current) use of insulin: Secondary | ICD-10-CM | POA: Diagnosis not present

## 2015-11-09 DIAGNOSIS — M109 Gout, unspecified: Secondary | ICD-10-CM | POA: Diagnosis present

## 2015-11-09 DIAGNOSIS — I252 Old myocardial infarction: Secondary | ICD-10-CM | POA: Diagnosis not present

## 2015-11-09 LAB — BASIC METABOLIC PANEL
Anion gap: 11 (ref 5–15)
BUN: 26 mg/dL — ABNORMAL HIGH (ref 6–20)
CALCIUM: 8.8 mg/dL — AB (ref 8.9–10.3)
CHLORIDE: 115 mmol/L — AB (ref 101–111)
CO2: 17 mmol/L — AB (ref 22–32)
Creatinine, Ser: 1.92 mg/dL — ABNORMAL HIGH (ref 0.44–1.00)
GFR calc Af Amer: 25 mL/min — ABNORMAL LOW (ref >60)
GFR calc non Af Amer: 22 mL/min — ABNORMAL LOW (ref >60)
Glucose, Bld: 174 mg/dL — ABNORMAL HIGH (ref 65–99)
POTASSIUM: 4.4 mmol/L (ref 3.5–5.1)
Sodium: 143 mmol/L (ref 135–145)

## 2015-11-09 LAB — GLUCOSE, CAPILLARY
GLUCOSE-CAPILLARY: 162 mg/dL — AB (ref 65–99)
GLUCOSE-CAPILLARY: 206 mg/dL — AB (ref 65–99)
Glucose-Capillary: 183 mg/dL — ABNORMAL HIGH (ref 65–99)
Glucose-Capillary: 135 mg/dL — ABNORMAL HIGH (ref 65–99)
Glucose-Capillary: 117 mg/dL — ABNORMAL HIGH (ref 65–99)

## 2015-11-09 LAB — CBC
HCT: 34.8 % — ABNORMAL LOW (ref 36.0–46.0)
HEMOGLOBIN: 10.6 g/dL — AB (ref 12.0–15.0)
MCH: 28.7 pg (ref 26.0–34.0)
MCHC: 30.5 g/dL (ref 30.0–36.0)
MCV: 94.3 fL (ref 78.0–100.0)
Platelets: 335 10*3/uL (ref 150–400)
RBC: 3.69 MIL/uL — AB (ref 3.87–5.11)
RDW: 15.7 % — ABNORMAL HIGH (ref 11.5–15.5)
WBC: 7.7 10*3/uL (ref 4.0–10.5)

## 2015-11-09 LAB — URINE CULTURE: Culture: NO GROWTH

## 2015-11-09 MED ORDER — LEVOTHYROXINE SODIUM 100 MCG IV SOLR
50.0000 ug | Freq: Every day | INTRAVENOUS | Status: DC
  Administered 2015-11-09 – 2015-11-11 (×2): 50 ug via INTRAVENOUS
  Filled 2015-11-09 (×4): qty 5

## 2015-11-09 MED ORDER — BOOST / RESOURCE BREEZE PO LIQD
1.0000 | Freq: Three times a day (TID) | ORAL | Status: DC
  Administered 2015-11-09 – 2015-11-13 (×7): 1 via ORAL

## 2015-11-09 MED ORDER — METOCLOPRAMIDE HCL 5 MG/ML IJ SOLN
5.0000 mg | Freq: Four times a day (QID) | INTRAMUSCULAR | Status: DC
  Administered 2015-11-09 – 2015-11-11 (×8): 5 mg via INTRAVENOUS
  Filled 2015-11-09 (×8): qty 2

## 2015-11-09 NOTE — Care Management Obs Status (Signed)
MEDICARE OBSERVATION STATUS NOTIFICATION   Patient Details  Name: [name] MRN: [id number] Date of Birth: 06/03/1923   Medicare Observation Status Notification Given:  Yes    Darrold Span, RN 11/09/2015, 3:04 PM

## 2015-11-09 NOTE — Progress Notes (Signed)
   11/09/15 2048  Vitals  Temp 98 F (36.7 C)  Temp Source Oral  BP (!) 192/88  BP Location Right Arm  BP Method Automatic  Patient Position (if appropriate) Lying  Pulse Rate (!) 110  Pulse Rate Source Dinamap  Resp 18  Oxygen Therapy  SpO2 100 %  O2 Device Room Air   Pt. BP is running high. Given scheduled IV hydralazine a little early for tx. Brought BP down to 183/80 and 183/78. Brayton Caves, NP. No new orders at this time. Will continue to monitor closely.

## 2015-11-09 NOTE — Evaluation (Signed)
Clinical/Bedside Swallow Evaluation Patient Details  Name: [name] MRN: [id number] Date of Birth: 08/29/1923  Today's Date: 11/09/2015 Time: SLP Start Time (ACUTE ONLY): 1533 SLP Stop Time (ACUTE ONLY): 1551 SLP Time Calculation (min) (ACUTE ONLY): 18 min  Past Medical History:  Past Medical History:  Diagnosis Date  . Abnormal nuclear stress test 06/01/09   Demonstrated a new area of infarct scar, peri-infarct ischemia seen in the inferolateral territory. EF eas normal at 70% with mild hypocontractility at the apex, distal inferolateral wall.  . Anemia   . Arthritis   . Cardiomyopathy, idiopathic (HCC) 02/08/2011  . Chronic diastolic CHF (congestive heart failure) (HCC)    Takes Lasix  . Chronic kidney disease (CKD), stage IV (severe) (HCC)   . Coronary artery disease    a. Cath 09/2010 - med rx.  . Diabetes mellitus    type 2 NIDDM  . Dyslipidemia   . GERD (gastroesophageal reflux disease)   . Gout    takes allopurinol  . H/O echocardiogram 09/06/11   Indication- nonIschemic Cardiomyopathy. EF = now greater than 55% with no regional wall motion abnormalities. Tthere is mild to moderate trisuspid regurgitayion and mild pulmonary hypertension with an RVSP of 35 mmHg as well as stage 1 diastolic dysfunction and mild to moderate LVH.  Marland Kitchen. Hx of transient ischemic attack (TIA)   . Hypertension   . Hypothyroidism    (SEVERE) Takes Levothryroxine  . Irregular heartbeat   . Memory loss   . Myocardial infarct (HCC)    x 3 unsure of years  . Nonischemic cardiomyopathy (HCC)    EF now is 55%, reduced due to myxedema, which is improved.  . Peripheral neuropathy (HCC)   . Peripheral vascular disease (HCC)    a. s/p L BKA.  . Shingles   . Stroke (HCC)   . TIA (transient ischemic attack)   . Ulcer    Past Surgical History:  Past Surgical History:  Procedure Laterality Date  . AMPUTATION  07/05/2011   Procedure: AMPUTATION BELOW KNEE;  Surgeon: Chuck Hinthristopher S Dickson, MD;   [facility];  Service: Vascular;  Laterality: Left;  . ANGIOPLASTY  1988  . AV FISTULA PLACEMENT    . AV FISTULA PLACEMENT Right 12/24/2013   Procedure: INSERTION OF ARTERIOVENOUS (AV) GORE-TEX GRAFT ARM;  Surgeon: Chuck Hinthristopher S Dickson, MD;  [facility];  Service: Vascular;  Laterality: Right;  . [facility]  . CARDIAC CATHETERIZATION  09/28/07   Demonstrated multiple sequential lesions around 40 to 30% in the RCA territory.  . Duplex doppler  05/10/11   LE arterial dopplers demonstrate bilaterally reduced ABIs of 0.91 on right & 0.56 on left. She does report some decreased pain on the left, & there's moderate mixed-density plaque in the right SFA w/50 to 69% reduction. There's a 69% reduction in the left SFA & does appear to be occlusive disease of left posterior tibial artery. Right posterior dorsalis pedis artery demonstrates occlusive disease  . ESOPHAGOGASTRODUODENOSCOPY N/A 04/15/2014   Procedure: ESOPHAGOGASTRODUODENOSCOPY (EGD);  Surgeon: Rachael Feeaniel P Jacobs, MD;  [facility];  Service: Endoscopy;  Laterality: N/A;  . EYE SURGERY     Left eye surgery; cataract removal  . INSERTION OF DIALYSIS CATHETER N/A 01/06/2013   Procedure: INSERTION OF DIALYSIS CATHETER;  Surgeon: Larina Earthlyodd F Early, MD;  [facility];  Service: Vascular;  Laterality: N/A;  . left bka  07/05/2011  . left lower extremity venous duplex Left 06/27/11  Summary: No evidence of DVT involving the left lower extremity and right common femoral vein.   Marland Kitchen LIGATION ARTERIOVENOUS GORTEX GRAFT Right 03/25/2014   Procedure: LIGATION ARTERIOVENOUS GORTEX GRAFT;  Surgeon: Chuck Hint, MD;  [facility];  Service: Vascular;  Laterality: Right;  . LOWER EXTREMITY ANGIOGRAM N/A 10/31/2011   Procedure: LOWER EXTREMITY ANGIOGRAM;  Surgeon: Chuck Hint, MD;  Location: [facility] CATH LAB;  Service: Cardiovascular;  Laterality: N/A;  . Lower extremity arterial evaluation  06/27/11   SUMMARY:  Right: ABI not ascertained due to false elevation in BP secondary to calcification (posterior tibial artery is non compressible). Left: ABI indicates moderate reduction in arterial flow. Bilateral: Great toe PPG waveforms indicate adequate perfusion. Great toe pressures not obtained due to patient's movements secondary to pain.  Marland Kitchen SHUNTOGRAM N/A 03/17/2014   Procedure: Betsey Amen;  Surgeon: Fransisco Hertz, MD;  Location: [facility] CATH LAB;  Service: Cardiovascular;  Laterality: N/A;   HPI:  [name] is a 82 y.o. female with medical history significant for stage IV chronic kidney disease, CAD medical therapy, chronic diastolic heart failure, diabetes, peripheral vascular disease, anemia of chronic disease, dyslipidemia, failure to thrive and moderate malnutrition, history of prior food impaction, history of bradycardia induced by beta blockers, history of myxedema cardiomyopathy in 2014 who presented to the ER with 2 days of nausea and vomiting and generalized abdominal pain. Patient has been unable to keep down any of her medications and on the first day of symptoms she was not eating and had clear emesis.    Assessment / Plan / Recommendation Clinical Impression  Pt referred for clinical assessment of swallow after admission with n/v. Pt is currently taking thin liquids with minimal intake reported. Pt was agreeable to assessment and tolerated ice chips, thin and puree without s/s aspiration. Pt's son reports that she primarily takes liquids at home for nutrition, but eats soft foods on occasion. Pt declined more than 1 teaspoon of pudding this date and was not agreeable to any trial of solids. Oropharyngeal swallow appears to be relatively intact. Concern primarily for esophageal dysphagia. Will defer to medical management. No further SLP services indicated at this time.     Aspiration Risk  Moderate aspiration risk    Diet Recommendation Thin liquid (full liquids)   Liquid Administration via:  Cup;Straw Medication Administration: Crushed with puree Supervision:  (total assist) Compensations: Slow rate;Small sips/bites;Follow solids with liquid Postural Changes: Seated upright at 90 degrees;Remain upright for at least 30 minutes after po intake    Other  Recommendations Recommended Consults: Consider GI evaluation;Consider esophageal assessment Oral Care Recommendations: Oral care QID   Follow up Recommendations  None    Frequency and Duration            Prognosis Prognosis for Safe Diet Advancement: Fair Barriers to Reach Goals: Severity of deficits      Swallow Study   General Date of Onset: 11/08/15 HPI: [name] is a 82 y.o. female with medical history significant for stage IV chronic kidney disease, CAD medical therapy, chronic diastolic heart failure, diabetes, peripheral vascular disease, anemia of chronic disease, dyslipidemia, failure to thrive and moderate malnutrition, history of prior food impaction, history of bradycardia induced by beta blockers, history of myxedema cardiomyopathy in 2014 who presented to the ER with 2 days of nausea and vomiting and generalized abdominal pain. Patient has been unable to keep down any of her medications and on the first day of symptoms she was not  eating and had clear emesis.  Type of Study: Bedside Swallow Evaluation Previous Swallow Assessment: 2015- BSE WFL Diet Prior to this Study:  (clears) Temperature Spikes Noted: No Respiratory Status: Room air History of Recent Intubation: No Behavior/Cognition: Requires cueing;Pleasant mood;Cooperative Oral Cavity Assessment: Within Functional Limits Oral Care Completed by SLP: No Oral Cavity - Dentition: Edentulous Self-Feeding Abilities: Total assist Patient Positioning: Upright in bed Baseline Vocal Quality: Hoarse;Low vocal intensity Volitional Cough: Cognitively unable to elicit Volitional Swallow: Unable to elicit    Oral/Motor/Sensory Function Overall Oral  Motor/Sensory Function: Within functional limits   Ice Chips Ice chips: Within functional limits Presentation: Spoon   Thin Liquid Thin Liquid: Within functional limits Presentation: Cup;Straw;Spoon    Nectar Thick Nectar Thick Liquid: Not tested   Honey Thick Honey Thick Liquid: Not tested   Puree Puree: Impaired Presentation: Spoon Oral Phase Functional Implications: Oral holding   Solid   GO   Solid: Not tested    Functional Assessment Tool Used: clinician judgement Functional Limitations: Swallowing Swallow Current Status (Z6109): At least 40 percent but less than 60 percent impaired, limited or restricted Swallow Goal Status [id number]): At least 40 percent but less than 60 percent impaired, limited or restricted Swallow Discharge Status [phone]): At least 40 percent but less than 60 percent impaired, limited or restricted   Rocky Crafts MA, CCC-SLP 11/09/2015,4:07 PM

## 2015-11-09 NOTE — Progress Notes (Signed)
[facility]                                                                              Patient Demographics  [name], is a 82 y.o. female, DOB - 10/04/1923, ZOX:[id number]  Admit date - 11/08/2015   Admitting Physician Costin Otelia SergeantM Gherghe, MD  [facility]  Outpatient specialists:   LOS - 0  days    Chief Complaint  Patient presents with  . Emesis  . Weakness  . Urinary Incontinence       Brief summary   [name] is a 82 y.o. female with medical history significant for stage IV chronic kidney disease, CAD medical therapy, chronic diastolic heart failure, diabetes, peripheral vascular disease, anemia of chronic disease, dyslipidemia, failure to thrive and moderate malnutrition, history of prior food impaction, history of bradycardia induced by beta blockers, history of myxedema cardiomyopathy in 2014 who presented to the ER with 2 days of nausea and vomiting and generalized abdominal pain. Patient has been unable to keep down any of her medications and on the first day of symptoms she was not eating and had clear emesis.   She was evaluated in the ER on 714 and vomiting and abdominal pain workup at that time was unrevealing including a negative CT of the abdomen and pelvis. BP was 196/102, potassium 5.5, CO2 19, creatinine 1.96, patient was admitted for further workup.  Assessment & Plan   Nausea & vomiting likely due to esophageal dysmotility -Patient presented with 2 day history of nausea and vomiting primarily of small amounts of food and all of her medications, this has been followed by abdominal pain was preceded with anorexia -Abdominal films unremarkable. No leukocytosis; urinalysis unremarkable -DG esophagogram, showed esophageal dysmotility with distal esophageal diverticulum, no stricture or obstruction. - will place on Reglan, follow QTC closely - Patient has profound hypothyroidism, which may  have caused slowed motility as well - Continue clears, advance to full liquids in a.m.  Active Problems:   Uncontrolled hypertension -Ongoing issue in the outpatient setting and made worse by inability to keep down home medications -Continue Scheduled IV hydralazine and nitroglycerin patch -No beta blocker secondary to prior symptomatic bradycardia in past    CKD (chronic kidney disease), stage IV  -Renal function stable and at baseline with BUN 30 and creatinine 1.96 -Continue gentle hydration, Lasix on hold.  -Preadmission Renvala and Rocaltrol on hold secondary to nausea and vomiting -Follow as outpatient neurology, Dr Deterding     FTT (failure to thrive) in adult/ Malnutrition of moderate degree  - multifactorial due to esophageal dysmotility, profound hypothyroidism, chronic kidney disease  - Nutrition consult, PTOT S    Metabolic acidosis -Secondary to chronicity severity of kidney disease, nausea and vomiting  - Continue gentle hydration      CAD - moderate at cath July 2012 (medical Rx) -Currently asymptomatic -No beta blocker secondary to symptomatic bradycardia; due to reports of suspected tachycardia prior to admission that may have been atrial fibrillation (see outpatient cardiology notes) I have requested a telemetry bed -Recurrent nausea and vomiting so preadmission Plavix and  aspirin as well as statin on hold -Have converted Imdur to Nitro-Dur patch for now    Diabetes mellitus type 2 with peripheral artery disease  -On Lantus at home,Follow CBGs and provide SSI Follow hemoglobin A1c     Chronic diastolic heart failure  -Currently compensated without evidence of heart failure, Hold Lasix -No ARB/ACE I due to renal insufficiency  Profound hypothyroidism -TSH >90, last in 2016 was 30 - Increase Synthroid, placed on IV Synthroid 50 MCG daily, will be oral 100 MCG daily, recheck TSH in 4 weeks     Dyslipidemia -Statin on hold as above    Anemia of  chronic disease -Hemoglobin stable and near baseline of around 10.5   Code Status: Full CODE STATUS DVT Prophylaxis:  Lovenox  Family Communication: Discussed in detail with the patient, all imaging results, lab results explained to the patient and son at bed side    Disposition Plan:  Hopefully in next 24-48 hours   Time Spent in minutes   25 minutes  Procedures:  DG esophagus   Consultants:   None   Antimicrobials:      Medications  Scheduled Meds: . enoxaparin (LOVENOX) injection  30 mg Subcutaneous Q24H  . famotidine (PEPCID) IV  20 mg Intravenous Q24H  . hydrALAZINE  20 mg Intravenous Q6H  . insulin aspart  0-9 Units Subcutaneous Q4H  . levothyroxine  37.5 mcg Intravenous Daily  . nitroGLYCERIN  0.2 mg Transdermal Daily  . sodium chloride flush  3 mL Intravenous Q12H   Continuous Infusions: . sodium chloride 50 mL/hr (11/08/15 1742)   PRN Meds:.acetaminophen **OR** acetaminophen, ondansetron (ZOFRAN) IV   Antibiotics   Anti-infectives    None        Subjective:   [name] was seen and examined today. Patient denies dizziness, chest pain, shortness of breath, abdominal pain, new weakness, numbess, tingling. No fever   Objective:   Vitals:   11/08/15 1916 11/09/15 0420 11/09/15 0431 11/09/15 0503  BP: 140/78 (!) 164/86    Pulse: (!) 107 96    Resp: 18 16    Temp: 98.2 F (36.8 C)   98.7 F (37.1 C)  TempSrc: Oral Oral  Oral  SpO2: 99%   99%  Weight:   52.9 kg (116 lb 11.2 oz)   Height:        Intake/Output Summary (Last 24 hours) at 11/09/15 1113 Last data filed at 11/09/15 0300  Gross per 24 hour  Intake              465 ml  Output                0 ml  Net              465 ml     Wt Readings from Last 3 Encounters:  11/09/15 52.9 kg (116 lb 11.2 oz)  10/02/15 56.7 kg (125 lb)  09/02/15 59 kg (130 lb)     Exam  General: Alert and oriented x 3, NAD  HEENT:   Neck: Supple, no JVD, no masses  Cardiovascular: S1 S2  auscultated, no rubs, murmurs or gallops. Regular rate and rhythm.  Respiratory: Clear to auscultation bilaterally, no wheezing, rales or rhonchi  Gastrointestinal: Soft, nontender, nondistended, + bowel sounds  Ext: no cyanosis clubbing or edema  Neuro: No new deficit   Skin: No rashes  Psych: Normal affect and demeanor, alert and oriented x3    Data Reviewed:  I have personally reviewed following  labs and imaging studies  Micro Results No results found for this or any previous visit (from the past 240 hour(s)).  Radiology Reports Dg Esophagus  Result Date: 11/09/2015 CLINICAL DATA:  Food getting stuck in the esophagus, nausea and vomiting. EXAM: ESOPHOGRAM/BARIUM SWALLOW TECHNIQUE: Single contrast examination was performed using  thin barium. FLUOROSCOPY TIME:  Radiation Exposure Index (as provided by the fluoroscopic device): 1 minutes 5 seconds. If the device does not provide the exposure index: Fluoroscopy Time:  dictate in minutes and seconds Number of Acquired Images:  1 COMPARISON:  None. FINDINGS: A limited examination was performed in the recumbent LPO position. There are tertiary contractions during swallowing. No definite esophageal fold thickening, stricture or obstruction. A small diverticulum is seen off the distal esophagus. A tablet was not administered due to patient condition. IMPRESSION: 1. Esophageal dysmotility. 2. Distal esophageal diverticulum. Electronically Signed   By: Leanna Battles M.D.   On: 11/09/2015 10:05   Dg Abd Portable 1v  Result Date: 11/08/2015 CLINICAL DATA:  Nausea and vomiting. EXAM: PORTABLE ABDOMEN - 1 VIEW COMPARISON:  January 13, 2013 FINDINGS: No bowel obstruction identified. No free air, portal venous gas, or pneumatosis. No acute abnormalities identified. IMPRESSION: No acute abnormalities identified. Electronically Signed   By: Gerome Sam III M.D   On: 11/08/2015 17:06    Lab Data:  CBC:  Recent Labs Lab 11/08/15 1610  11/09/15 0546  WBC 7.5 7.7  NEUTROABS 5.0  --   HGB 10.5* 10.6*  HCT 33.9* 34.8*  MCV 93.1 94.3  PLT 321 335   Basic Metabolic Panel:  Recent Labs Lab 11/08/15 1409 11/09/15 0546  NA 142 143  K 5.5* 4.4  CL 111 115*  CO2 19* 17*  GLUCOSE 199* 174*  BUN 30* 26*  CREATININE 1.96* 1.92*  CALCIUM 9.4 8.8*   GFR: Estimated Creatinine Clearance: 14 mL/min (by C-G formula based on SCr of 1.92 mg/dL). Liver Function Tests:  Recent Labs Lab 11/08/15 1409  AST 49*  ALT 35  ALKPHOS 104  BILITOT 0.5  PROT 7.7  ALBUMIN 3.4*   No results for input(s): LIPASE, AMYLASE in the last 168 hours. No results for input(s): AMMONIA in the last 168 hours. Coagulation Profile:  Recent Labs Lab 11/08/15 1530  INR 1.12   Cardiac Enzymes:  Recent Labs Lab 11/08/15 2032  CKTOTAL 9*   BNP (last 3 results) No results for input(s): PROBNP in the last 8760 hours. HbA1C: No results for input(s): HGBA1C in the last 72 hours. CBG:  Recent Labs Lab 11/08/15 1346 11/08/15 1748 11/08/15 2045 11/09/15 0415  GLUCAP 231* 155* 145* 162*   Lipid Profile: No results for input(s): CHOL, HDL, LDLCALC, TRIG, CHOLHDL, LDLDIRECT in the last 72 hours. Thyroid Function Tests:  Recent Labs  11/08/15 2230  TSH >90.000*   Anemia Panel: No results for input(s): VITAMINB12, FOLATE, FERRITIN, TIBC, IRON, RETICCTPCT in the last 72 hours. Urine analysis:    Component Value Date/Time   COLORURINE YELLOW 11/08/2015 1451   APPEARANCEUR CLEAR 11/08/2015 1451   LABSPEC 1.019 11/08/2015 1451   PHURINE 6.0 11/08/2015 1451   GLUCOSEU 100 (A) 11/08/2015 1451   HGBUR NEGATIVE 11/08/2015 1451   BILIRUBINUR SMALL (A) 11/08/2015 1451   KETONESUR NEGATIVE 11/08/2015 1451   PROTEINUR >300 (A) 11/08/2015 1451   UROBILINOGEN 0.2 08/18/2013 1807   NITRITE NEGATIVE 11/08/2015 1451   LEUKOCYTESUR NEGATIVE 11/08/2015 1451     Santa Abdelrahman M.D. [facility] 11/09/2015, 11:13 AM  Pager:  [phone] Between 7am  to 7pm - call Pager - [phone]  After 7pm go to [email or web address] - password [email or web address]  Call night coverage person covering after 7pm

## 2015-11-09 NOTE — Progress Notes (Signed)
Initial Nutrition Assessment  DOCUMENTATION CODES:   Not applicable  INTERVENTION:  Boost Breeze po TID, each supplement provides 250 kcal and 9 grams of protein. When diet advanced, recommend Ensure Enlive BID.  Discussed ordering snacks between meals to improve intake. When diet advanced, emphasized importance of protein at each snack.  RD will continue to follow.  NUTRITION DIAGNOSIS:   Inadequate oral intake related to nausea, vomiting, poor appetite as evidenced by meal completion < 25%, per patient/family report.  GOAL:   Patient will meet greater than or equal to 90% of their needs  MONITOR:   PO intake, Supplement acceptance, Diet advancement  REASON FOR ASSESSMENT:   Consult Assessment of nutrition requirement/status  ASSESSMENT:   82 yo F with history of CAD, HTN, CHF, Dm, CKD (stage IV) presented with nausea/vomiting and generalized abdominal pain for couple of days, inability to have po intake and nausea and vomiting. She has been off of her medications for 4 days now. History of dysphagia with occasional episodes of food getting stuck - plan to obtain barium swallow.   Assessment completed with patient and son. Patient able to respond to some questions, but son assisted in most of interview. Appetite has been poor since 8/16 and patient has been unable to eat meals due to N/V. She attempted to drink 2 Ensure/day but could not keep them down, either. On a typical day, intake is 2 meals/day. Patient and son are unsure of UBW as patient is an amputee (left BKA in 06/2011) and they are unable to weigh her regularly. However, they report they have not noticed clothes fitting differently or changes in face/muscle appearance. There is some weight fluctuation in chart - likely due to volume status changes in setting of CHF and CKD stage IV as pt and son confirm she frequently has edema. Patient is currently on clear liquid diet.  Medications reviewed and include: famotidine,  Novolog sliding scale Q4hrs, levothyroxine, NS @ 50 ml/hr.  Labs reviewed: CBG 145-162 since admission, HgbA1c 6.4 06/2014.  Nutrition-Focused physical exam completed. Findings are no fat depletion, no muscle depletion, and no edema.   Discussed plan with RN. He is unsure of when diet will be advanced.  Diet Order:  Diet clear liquid Room service appropriate? Yes; Fluid consistency: Thin  Skin:  Reviewed, no issues  Last BM:  PTA  Height:   Ht Readings from Last 1 Encounters:  11/08/15 5' (1.524 m)    Weight:   Wt Readings from Last 1 Encounters:  11/09/15 116 lb 11.2 oz (52.9 kg)    Ideal Body Weight:  42.5 kg  BMI:  Body mass index is 22.79 kg/m.  Estimated Nutritional Needs:   Kcal:  1200-1400  Protein:  65-75 grams  Fluid:  >/= 1.2 L/day  EDUCATION NEEDS:   Education needs addressed (Small, frequent meals; emphasis on calories and protein; discussed options.)  Helane Rima, MS, RD, LDN

## 2015-11-10 LAB — BASIC METABOLIC PANEL
ANION GAP: 11 (ref 5–15)
BUN: 22 mg/dL — ABNORMAL HIGH (ref 6–20)
CALCIUM: 8.8 mg/dL — AB (ref 8.9–10.3)
CHLORIDE: 119 mmol/L — AB (ref 101–111)
CO2: 14 mmol/L — AB (ref 22–32)
Creatinine, Ser: 1.91 mg/dL — ABNORMAL HIGH (ref 0.44–1.00)
GFR calc non Af Amer: 22 mL/min — ABNORMAL LOW (ref >60)
GFR, EST AFRICAN AMERICAN: 26 mL/min — AB (ref >60)
Glucose, Bld: 145 mg/dL — ABNORMAL HIGH (ref 65–99)
POTASSIUM: 4.4 mmol/L (ref 3.5–5.1)
Sodium: 144 mmol/L (ref 135–145)

## 2015-11-10 LAB — GLUCOSE, CAPILLARY
GLUCOSE-CAPILLARY: 189 mg/dL — AB (ref 65–99)
Glucose-Capillary: 170 mg/dL — ABNORMAL HIGH (ref 65–99)
Glucose-Capillary: 192 mg/dL — ABNORMAL HIGH (ref 65–99)

## 2015-11-10 MED ORDER — SEVELAMER CARBONATE 800 MG PO TABS: 800.0000 mg | ORAL_TABLET | Freq: Three times a day (TID) | ORAL | Status: DC

## 2015-11-10 MED ORDER — HYDRALAZINE HCL 50 MG PO TABS
50.0000 mg | ORAL_TABLET | Freq: Two times a day (BID) | ORAL | Status: DC
  Filled 2015-11-10: qty 1

## 2015-11-10 MED ORDER — AMLODIPINE BESYLATE 10 MG PO TABS
10.0000 mg | ORAL_TABLET | Freq: Every day | ORAL | Status: DC
  Filled 2015-11-10: qty 1

## 2015-11-10 MED ORDER — METOPROLOL TARTRATE 5 MG/5ML IV SOLN: 10.0000 mg | Freq: Four times a day (QID) | INTRAVENOUS | Status: DC

## 2015-11-10 MED ORDER — NITROGLYCERIN 0.3 MG/HR TD PT24
0.3000 mg | MEDICATED_PATCH | Freq: Every day | TRANSDERMAL | Status: DC
  Administered 2015-11-10: 0.3 mg via TRANSDERMAL
  Filled 2015-11-10: qty 1

## 2015-11-10 MED ORDER — HYDRALAZINE HCL 20 MG/ML IJ SOLN: 20.0000 mg | Freq: Four times a day (QID) | INTRAMUSCULAR | Status: DC

## 2015-11-10 MED ORDER — CLOPIDOGREL BISULFATE 75 MG PO TABS: 75.0000 mg | ORAL_TABLET | Freq: Every evening | ORAL | Status: DC

## 2015-11-10 MED ORDER — IPRATROPIUM-ALBUTEROL 0.5-2.5 (3) MG/3ML IN SOLN: 3.0000 mL | Freq: Every evening | RESPIRATORY_TRACT | Status: DC | PRN

## 2015-11-10 MED ORDER — CALCITRIOL 0.25 MCG PO CAPS
0.2500 ug | ORAL_CAPSULE | ORAL | Status: DC
  Filled 2015-11-10: qty 1

## 2015-11-10 MED ORDER — GABAPENTIN 300 MG PO CAPS
300.0000 mg | ORAL_CAPSULE | Freq: Three times a day (TID) | ORAL | Status: DC
  Filled 2015-11-10: qty 1

## 2015-11-10 MED ORDER — HYDRALAZINE HCL 20 MG/ML IJ SOLN
10.0000 mg | INTRAMUSCULAR | Status: DC | PRN
  Administered 2015-11-10 – 2015-11-13 (×9): 10 mg via INTRAVENOUS
  Filled 2015-11-10 (×8): qty 1

## 2015-11-10 MED ORDER — HYDRALAZINE HCL 50 MG PO TABS: 50.0000 mg | ORAL_TABLET | Freq: Two times a day (BID) | ORAL | Status: DC

## 2015-11-10 MED ORDER — SODIUM BICARBONATE 650 MG PO TABS: 650.0000 mg | ORAL_TABLET | Freq: Every day | ORAL | Status: DC

## 2015-11-10 MED ORDER — HYDRALAZINE HCL 20 MG/ML IJ SOLN: 10.0000 mg | Freq: Four times a day (QID) | INTRAMUSCULAR | Status: DC | PRN

## 2015-11-10 MED ORDER — LEVOTHYROXINE SODIUM 75 MCG PO TABS: 75.0000 ug | ORAL_TABLET | Freq: Every day | ORAL | Status: DC

## 2015-11-10 MED ORDER — LABETALOL HCL 5 MG/ML IV SOLN
20.0000 mg | Freq: Once | INTRAVENOUS | Status: AC
  Administered 2015-11-10: 20 mg via INTRAVENOUS

## 2015-11-10 MED ORDER — PRAVASTATIN SODIUM 40 MG PO TABS: 40.0000 mg | ORAL_TABLET | Freq: Every evening | ORAL | Status: DC

## 2015-11-10 MED ORDER — METOPROLOL TARTRATE 5 MG/5ML IV SOLN
5.0000 mg | Freq: Four times a day (QID) | INTRAVENOUS | Status: DC
  Administered 2015-11-11 (×2): 5 mg via INTRAVENOUS
  Filled 2015-11-10 (×2): qty 5

## 2015-11-10 MED ORDER — ALLOPURINOL 100 MG PO TABS
200.0000 mg | ORAL_TABLET | Freq: Every day | ORAL | Status: DC
  Filled 2015-11-10: qty 2

## 2015-11-10 MED ORDER — INSULIN ASPART 100 UNIT/ML ~~LOC~~ SOLN
0.0000 [IU] | Freq: Three times a day (TID) | SUBCUTANEOUS | Status: DC
  Administered 2015-11-10 – 2015-11-11 (×2): 2 [IU] via SUBCUTANEOUS
  Administered 2015-11-11 (×2): 1 [IU] via SUBCUTANEOUS
  Administered 2015-11-12: 5 [IU] via SUBCUTANEOUS
  Administered 2015-11-12: 2 [IU] via SUBCUTANEOUS
  Administered 2015-11-13: 1 [IU] via SUBCUTANEOUS
  Administered 2015-11-13: 3 [IU] via SUBCUTANEOUS

## 2015-11-10 MED ORDER — ISOSORBIDE MONONITRATE ER 60 MG PO TB24
60.0000 mg | ORAL_TABLET | Freq: Every day | ORAL | Status: DC
  Filled 2015-11-10: qty 1

## 2015-11-10 MED ORDER — METOPROLOL TARTRATE 5 MG/5ML IV SOLN
5.0000 mg | Freq: Four times a day (QID) | INTRAVENOUS | Status: DC
  Administered 2015-11-10 (×2): 5 mg via INTRAVENOUS
  Filled 2015-11-10 (×2): qty 5

## 2015-11-10 MED ORDER — LABETALOL HCL 5 MG/ML IV SOLN
5.0000 mg | Freq: Once | INTRAVENOUS | Status: AC
Start: 2015-11-10 — End: 2015-11-10
  Administered 2015-11-10: 5 mg via INTRAVENOUS
  Filled 2015-11-10: qty 4

## 2015-11-10 MED ORDER — NITROGLYCERIN 0.4 MG/HR TD PT24
0.4000 mg | MEDICATED_PATCH | Freq: Every day | TRANSDERMAL | Status: DC
  Administered 2015-11-11 – 2015-11-12 (×2): 0.4 mg via TRANSDERMAL
  Filled 2015-11-10 (×2): qty 1

## 2015-11-10 MED ORDER — LABETALOL HCL 5 MG/ML IV SOLN
INTRAVENOUS | Status: AC
  Administered 2015-11-10: 20 mg via INTRAVENOUS
  Filled 2015-11-10: qty 4

## 2015-11-10 MED ORDER — DOCUSATE SODIUM 100 MG PO CAPS: 100.0000 mg | ORAL_CAPSULE | Freq: Every day | ORAL | Status: DC | PRN

## 2015-11-10 NOTE — Progress Notes (Signed)
Pt's BP continues to be elevated. MD notified. Will await orders.

## 2015-11-10 NOTE — Progress Notes (Signed)
[facility]                                                                              Patient Demographics  [name], is a 82 y.o. female, DOB - 1923/07/21, ZOX:[id number]  Admit date - 11/08/2015   Admitting Physician Costin Otelia Sergeant, MD  Outpatient Primary MD for the patient is [facility], MD  Outpatient specialists:   LOS - 1  days    Chief Complaint  Patient presents with  . Emesis  . Weakness  . Urinary Incontinence       Brief summary   [name] is a 82 y.o. female with medical history significant for stage IV chronic kidney disease, CAD medical therapy, chronic diastolic heart failure, diabetes, peripheral vascular disease, anemia of chronic disease, dyslipidemia, failure to thrive and moderate malnutrition, history of prior food impaction, history of bradycardia induced by beta blockers, history of myxedema cardiomyopathy in 2014 who presented to the ER with 2 days of nausea and vomiting and generalized abdominal pain. Patient has been unable to keep down any of her medications and on the first day of symptoms she was not eating and had clear emesis.   She was evaluated in the ER on 714 and vomiting and abdominal pain workup at that time was unrevealing including a negative CT of the abdomen and pelvis. BP was 196/102, potassium 5.5, CO2 19, creatinine 1.96, patient was admitted for further workup.  Assessment & Plan   Nausea & vomiting likely due to esophageal dysmotility -Patient presented with 2 day history of nausea and vomiting primarily of small amounts of food and all of her medications, this has been followed by abdominal pain was preceded with anorexia -Abdominal films unremarkable. No leukocytosis; urinalysis unremarkable -DG esophagogram, showed esophageal dysmotility with distal esophageal diverticulum, no stricture or obstruction. - Placed on Reglan, follow QTC closely - Patient also found to have profound hypothyroidism,  which may have caused slowed motility as well - Advance to soft solids today  Active Problems:   Uncontrolled hypertension -Ongoing issue in the outpatient setting and made worse by inability to keep down home medications - Restarted all oral outpatient antihypertensives as patient has not having any vomiting.  - Labetalol 20 mg IV 1  -No beta blocker secondary to prior symptomatic bradycardia in past    CKD (chronic kidney disease), stage IV  -Renal function stable and at baseline with BUN 30 and creatinine 1.96 -Lasix on hold, may restart in a.m., if patient is tolerating solid diet  -Restart Preadmission Renvala and Rocaltrol  -Follow as outpatient renal, Dr Deterding     FTT (failure to thrive) in adult/ Malnutrition of moderate degree  - multifactorial due to esophageal dysmotility, profound hypothyroidism, chronic kidney disease  - Nutrition consult, PTOT  NAG Metabolic acidosis -Secondary to chronicity severity of kidney disease, nausea and vomiting  - Continue gentle hydration   - Started on bicarbonate tablets    CAD - moderate at cath July 2012 (medical Rx) -Currently asymptomatic -No beta blocker secondary to symptomatic bradycardia; due to reports of suspected tachycardia prior to admission that may have been atrial fibrillation (  see outpatient cardiology notes) I have requested a telemetry bed -Recurrent nausea and vomiting so preadmission Plavix and aspirin as well as statin on hold -Have converted Imdur to Nitro-Dur patch for now    Diabetes mellitus type 2 with peripheral artery disease  -On Lantus at home,Follow CBGs and provide SSI Follow hemoglobin A1c     Chronic diastolic heart failure  -Currently compensated without evidence of heart failure, Hold Lasix -No ARB/ACE I due to renal insufficiency  Profound hypothyroidism -TSH >90, last in 2016 was 30 - Increase Synthroid, placed on IV Synthroid 50 MCG daily, will be oral 100 MCG daily, recheck TSH  in 4 weeks     Dyslipidemia -Statin on hold as above    Anemia of chronic disease -Hemoglobin stable and near baseline of around 10.5   Code Status: Full CODE STATUS DVT Prophylaxis:  Lovenox  Family Communication: Discussed in detail with the patient, all imaging results, lab results explained to the patient. Discussed with son yesterday   Disposition Plan:  Hopefully in next 24-48 hours   Time Spent in minutes   25 minutes  Procedures:  DG esophagus   Consultants:   None   Antimicrobials:      Medications  Scheduled Meds: . allopurinol  200 mg Oral Daily  . amLODipine  10 mg Oral Daily  . calcitRIOL  0.25 mcg Oral QODAY  . clopidogrel  75 mg Oral QPM  . enoxaparin (LOVENOX) injection  30 mg Subcutaneous Q24H  . famotidine (PEPCID) IV  20 mg Intravenous Q24H  . feeding supplement  1 Container Oral TID BM  . gabapentin  300 mg Oral TID  . hydrALAZINE  50 mg Oral BID  . insulin aspart  0-9 Units Subcutaneous Q4H  . isosorbide mononitrate  60 mg Oral Daily  . levothyroxine  50 mcg Intravenous Daily  . metoCLOPramide (REGLAN) injection  5 mg Intravenous Q6H  . nitroGLYCERIN  0.3 mg Transdermal Daily  . pravastatin  40 mg Oral QPM  . sevelamer carbonate  800 mg Oral TID WC  . sodium chloride flush  3 mL Intravenous Q12H   Continuous Infusions:   PRN Meds:.acetaminophen **OR** acetaminophen, docusate sodium, hydrALAZINE, ipratropium-albuterol, ondansetron (ZOFRAN) IV   Antibiotics   Anti-infectives    None        Subjective:   Threasa BeardsRoberta Solimine was seen and examined today. No nausea or vomiting. BP still uncontrolled. Tolerating clears. Patient denies dizziness, chest pain, shortness of breath, abdominal pain, new weakness, numbess, tingling. No fever   Objective:   Vitals:   11/10/15 0442 11/10/15 0443 11/10/15 0533 11/10/15 0735  BP: (!) 191/86   (!) 202/98  Pulse:   99   Resp:      Temp:   98.4 F (36.9 C)   TempSrc:   Oral   SpO2:   100%     Weight:  52.7 kg (116 lb 3.2 oz)    Height:        Intake/Output Summary (Last 24 hours) at 11/10/15 1212 Last data filed at 11/10/15 0300  Gross per 24 hour  Intake             1470 ml  Output                0 ml  Net             1470 ml     Wt Readings from Last 3 Encounters:  11/10/15 52.7 kg (116 lb 3.2 oz)  10/02/15 56.7 kg (125 lb)  09/02/15 59 kg (130 lb)     Exam  General: Alert and oriented , NAD  HEENT:   Neck: Supple, no JVD, no masses  Cardiovascular: S1 S2 auscultated, no rubs, murmurs or gallops. Regular rate and rhythm.  Respiratory: Clear to auscultation bilaterally, no wheezing, rales or rhonchi  Gastrointestinal: Soft, nontender, nondistended, + bowel sounds  Ext: no cyanosis clubbing or edema, LLE AKA  Neuro: No new deficit   Skin: No rashes  Psych: Normal affect and demeanor, alert and oriented    Data Reviewed:  I have personally reviewed following labs and imaging studies  Micro Results Recent Results (from the past 240 hour(s))  Urine culture     Status: None   Collection Time: 11/08/15  2:51 PM  Result Value Ref Range Status   Specimen Description URINE, RANDOM  Final   Special Requests NONE  Final   Culture NO GROWTH  Final   Report Status 11/09/2015 FINAL  Final    Radiology Reports Dg Esophagus  Result Date: 11/09/2015 CLINICAL DATA:  Food getting stuck in the esophagus, nausea and vomiting. EXAM: ESOPHOGRAM/BARIUM SWALLOW TECHNIQUE: Single contrast examination was performed using  thin barium. FLUOROSCOPY TIME:  Radiation Exposure Index (as provided by the fluoroscopic device): 1 minutes 5 seconds. If the device does not provide the exposure index: Fluoroscopy Time:  dictate in minutes and seconds Number of Acquired Images:  1 COMPARISON:  None. FINDINGS: A limited examination was performed in the recumbent LPO position. There are tertiary contractions during swallowing. No definite esophageal fold thickening, stricture or  obstruction. A small diverticulum is seen off the distal esophagus. A tablet was not administered due to patient condition. IMPRESSION: 1. Esophageal dysmotility. 2. Distal esophageal diverticulum. Electronically Signed   By: Leanna Battles M.D.   On: 11/09/2015 10:05   Dg Abd Portable 1v  Result Date: 11/08/2015 CLINICAL DATA:  Nausea and vomiting. EXAM: PORTABLE ABDOMEN - 1 VIEW COMPARISON:  January 13, 2013 FINDINGS: No bowel obstruction identified. No free air, portal venous gas, or pneumatosis. No acute abnormalities identified. IMPRESSION: No acute abnormalities identified. Electronically Signed   By: Gerome Sam III M.D   On: 11/08/2015 17:06    Lab Data:  CBC:  Recent Labs Lab 11/08/15 1610 11/09/15 0546  WBC 7.5 7.7  NEUTROABS 5.0  --   HGB 10.5* 10.6*  HCT 33.9* 34.8*  MCV 93.1 94.3  PLT 321 335   Basic Metabolic Panel:  Recent Labs Lab 11/08/15 1409 11/09/15 0546 11/10/15 0240  NA 142 143 144  K 5.5* 4.4 4.4  CL 111 115* 119*  CO2 19* 17* 14*  GLUCOSE 199* 174* 145*  BUN 30* 26* 22*  CREATININE 1.96* 1.92* 1.91*  CALCIUM 9.4 8.8* 8.8*   GFR: Estimated Creatinine Clearance: 14.1 mL/min (by C-G formula based on SCr of 1.91 mg/dL). Liver Function Tests:  Recent Labs Lab 11/08/15 1409  AST 49*  ALT 35  ALKPHOS 104  BILITOT 0.5  PROT 7.7  ALBUMIN 3.4*   No results for input(s): LIPASE, AMYLASE in the last 168 hours. No results for input(s): AMMONIA in the last 168 hours. Coagulation Profile:  Recent Labs Lab 11/08/15 1530  INR 1.12   Cardiac Enzymes:  Recent Labs Lab 11/08/15 2032  CKTOTAL 9*   BNP (last 3 results) No results for input(s): PROBNP in the last 8760 hours. HbA1C: No results for input(s): HGBA1C in the last 72 hours. CBG:  Recent Labs Lab 11/09/15  1123 11/09/15 1607 11/09/15 2046 11/09/15 2327 11/10/15 0325  GLUCAP 183* 135* 206* 117* 170*   Lipid Profile: No results for input(s): CHOL, HDL, LDLCALC, TRIG,  CHOLHDL, LDLDIRECT in the last 72 hours. Thyroid Function Tests:  Recent Labs  11/08/15 2230  TSH >90.000*   Anemia Panel: No results for input(s): VITAMINB12, FOLATE, FERRITIN, TIBC, IRON, RETICCTPCT in the last 72 hours. Urine analysis:    Component Value Date/Time   COLORURINE YELLOW 11/08/2015 1451   APPEARANCEUR CLEAR 11/08/2015 1451   LABSPEC 1.019 11/08/2015 1451   PHURINE 6.0 11/08/2015 1451   GLUCOSEU 100 (A) 11/08/2015 1451   HGBUR NEGATIVE 11/08/2015 1451   BILIRUBINUR SMALL (A) 11/08/2015 1451   KETONESUR NEGATIVE 11/08/2015 1451   PROTEINUR >300 (A) 11/08/2015 1451   UROBILINOGEN 0.2 08/18/2013 1807   NITRITE NEGATIVE 11/08/2015 1451   LEUKOCYTESUR NEGATIVE 11/08/2015 1451     RAI,RIPUDEEP M.D. [facility] 11/10/2015, 12:12 PM  Pager: [phone] Between 7am to 7pm - call Pager - [phone]  After 7pm go to [email or web address] - password [email or web address]  Call night coverage person covering after 7pm

## 2015-11-10 NOTE — Progress Notes (Addendum)
   11/10/15 0243  Vitals  BP (!) 202/93  MAP (mmHg) 123  BP Location Right Arm  BP Method Automatic  Patient Position (if appropriate) Lying  ECG Heart Rate 96  Resp 20   Manual Check: 198/94.   Brayton Caves, NP. Received order for Nitro Patch 0.3 mg scheduled at 6:00 AM. Will give scheduled IV hydralazine now and continue to monitor closely.

## 2015-11-11 ENCOUNTER — Encounter (HOSPITAL_COMMUNITY): Payer: Self-pay | Admitting: General Practice

## 2015-11-11 DIAGNOSIS — R1110 Vomiting, unspecified: Secondary | ICD-10-CM

## 2015-11-11 DIAGNOSIS — I10 Essential (primary) hypertension: Secondary | ICD-10-CM

## 2015-11-11 DIAGNOSIS — E1151 Type 2 diabetes mellitus with diabetic peripheral angiopathy without gangrene: Secondary | ICD-10-CM

## 2015-11-11 DIAGNOSIS — D638 Anemia in other chronic diseases classified elsewhere: Secondary | ICD-10-CM

## 2015-11-11 DIAGNOSIS — R627 Adult failure to thrive: Secondary | ICD-10-CM

## 2015-11-11 DIAGNOSIS — E039 Hypothyroidism, unspecified: Secondary | ICD-10-CM

## 2015-11-11 DIAGNOSIS — I5032 Chronic diastolic (congestive) heart failure: Secondary | ICD-10-CM

## 2015-11-11 DIAGNOSIS — N184 Chronic kidney disease, stage 4 (severe): Secondary | ICD-10-CM

## 2015-11-11 DIAGNOSIS — E872 Acidosis: Secondary | ICD-10-CM

## 2015-11-11 LAB — GLUCOSE, CAPILLARY
GLUCOSE-CAPILLARY: 155 mg/dL — AB (ref 65–99)
GLUCOSE-CAPILLARY: 148 mg/dL — AB (ref 65–99)
Glucose-Capillary: 150 mg/dL — ABNORMAL HIGH (ref 65–99)
Glucose-Capillary: 146 mg/dL — ABNORMAL HIGH (ref 65–99)

## 2015-11-11 LAB — CBC
HCT: 33.5 % — ABNORMAL LOW (ref 36.0–46.0)
Hemoglobin: 10.3 g/dL — ABNORMAL LOW (ref 12.0–15.0)
MCH: 28.9 pg (ref 26.0–34.0)
MCHC: 30.7 g/dL (ref 30.0–36.0)
MCV: 93.8 fL (ref 78.0–100.0)
PLATELETS: 292 10*3/uL (ref 150–400)
RBC: 3.57 MIL/uL — ABNORMAL LOW (ref 3.87–5.11)
RDW: 16.3 % — AB (ref 11.5–15.5)
WBC: 8.1 10*3/uL (ref 4.0–10.5)

## 2015-11-11 LAB — BASIC METABOLIC PANEL
Anion gap: 9 (ref 5–15)
BUN: 17 mg/dL (ref 6–20)
CO2: 15 mmol/L — ABNORMAL LOW (ref 22–32)
CREATININE: 1.7 mg/dL — AB (ref 0.44–1.00)
Calcium: 8.3 mg/dL — ABNORMAL LOW (ref 8.9–10.3)
Chloride: 117 mmol/L — ABNORMAL HIGH (ref 101–111)
GFR, EST AFRICAN AMERICAN: 29 mL/min — AB (ref >60)
GFR, EST NON AFRICAN AMERICAN: 25 mL/min — AB (ref >60)
Glucose, Bld: 168 mg/dL — ABNORMAL HIGH (ref 65–99)
Potassium: 4.7 mmol/L (ref 3.5–5.1)
SODIUM: 141 mmol/L (ref 135–145)

## 2015-11-11 LAB — HEMOGLOBIN A1C
HEMOGLOBIN A1C: 8.4 % — AB (ref 4.8–5.6)
Mean Plasma Glucose: 194 mg/dL

## 2015-11-11 MED ORDER — CLOPIDOGREL BISULFATE 75 MG PO TABS
75.0000 mg | ORAL_TABLET | Freq: Every evening | ORAL | Status: DC
  Administered 2015-11-11 – 2015-11-12 (×2): 75 mg via ORAL
  Filled 2015-11-11 (×2): qty 1

## 2015-11-11 MED ORDER — HYDRALAZINE HCL 25 MG PO TABS
25.0000 mg | ORAL_TABLET | Freq: Two times a day (BID) | ORAL | Status: DC
  Administered 2015-11-11: 25 mg via ORAL
  Filled 2015-11-11: qty 1

## 2015-11-11 MED ORDER — CALCIUM CARBONATE-VITAMIN D 500-200 MG-UNIT PO TABS
1.0000 | ORAL_TABLET | Freq: Every day | ORAL | Status: DC
  Administered 2015-11-12: 1 via ORAL
  Filled 2015-11-11 (×3): qty 1

## 2015-11-11 MED ORDER — METOCLOPRAMIDE HCL 5 MG PO TABS
5.0000 mg | ORAL_TABLET | Freq: Three times a day (TID) | ORAL | Status: DC
  Administered 2015-11-11 – 2015-11-13 (×7): 5 mg via ORAL
  Filled 2015-11-11 (×9): qty 1

## 2015-11-11 MED ORDER — ASPIRIN EC 81 MG PO TBEC
81.0000 mg | DELAYED_RELEASE_TABLET | ORAL | Status: DC
  Administered 2015-11-12 – 2015-11-13 (×2): 81 mg via ORAL
  Filled 2015-11-11 (×2): qty 1

## 2015-11-11 MED ORDER — AMLODIPINE BESYLATE 5 MG PO TABS
5.0000 mg | ORAL_TABLET | Freq: Once | ORAL | Status: AC
  Administered 2015-11-11: 5 mg via ORAL
  Filled 2015-11-11: qty 1

## 2015-11-11 MED ORDER — AMLODIPINE BESYLATE 10 MG PO TABS
10.0000 mg | ORAL_TABLET | Freq: Every day | ORAL | Status: DC
  Administered 2015-11-12 – 2015-11-13 (×2): 10 mg via ORAL
  Filled 2015-11-11 (×2): qty 1

## 2015-11-11 MED ORDER — HYDRALAZINE HCL 50 MG PO TABS
50.0000 mg | ORAL_TABLET | Freq: Two times a day (BID) | ORAL | Status: DC
  Administered 2015-11-12 – 2015-11-13 (×2): 50 mg via ORAL
  Filled 2015-11-11 (×4): qty 1

## 2015-11-11 MED ORDER — AMLODIPINE BESYLATE 5 MG PO TABS
5.0000 mg | ORAL_TABLET | Freq: Every day | ORAL | Status: DC
  Administered 2015-11-11: 5 mg via ORAL
  Filled 2015-11-11: qty 1

## 2015-11-11 MED ORDER — FUROSEMIDE 40 MG PO TABS
40.0000 mg | ORAL_TABLET | Freq: Two times a day (BID) | ORAL | Status: DC
  Administered 2015-11-11 – 2015-11-13 (×4): 40 mg via ORAL
  Filled 2015-11-11 (×4): qty 1

## 2015-11-11 MED ORDER — SEVELAMER CARBONATE 800 MG PO TABS
800.0000 mg | ORAL_TABLET | Freq: Three times a day (TID) | ORAL | Status: DC
  Administered 2015-11-12 – 2015-11-13 (×5): 800 mg via ORAL
  Filled 2015-11-11 (×5): qty 1

## 2015-11-11 MED ORDER — CALCITRIOL 0.25 MCG PO CAPS
0.2500 ug | ORAL_CAPSULE | ORAL | Status: DC
  Filled 2015-11-11: qty 1

## 2015-11-11 NOTE — Progress Notes (Signed)
PROGRESS NOTE    [name]  ZOX:[id number] DOB: Dec 03, 1923 DOA: 11/08/2015 PCP: Georgianne Fick, MD    Brief Narrative:  Shannon Lech Enochis a 82 y.o.femalewith medical history significant for stage IV chronic kidney disease, CAD medical therapy, chronic diastolic heart failure, diabetes, peripheral vascular disease, anemia of chronic disease, dyslipidemia, failure to thrive and moderate malnutrition, history of prior food impaction, history of bradycardia induced by beta blockers, history of myxedema cardiomyopathy in 2014 who presented to the ER with 2 days of nausea and vomiting and generalized abdominal pain. Patient has been unable to keep down any of her medications and on the first day of symptoms she was not eating and had clear emesis.   She was evaluated in the ER on 714 and vomiting and abdominal pain workup at that time was unrevealing including a negative CT of the abdomen and pelvis. BP was 196/102, potassium 5.5, CO2 19, creatinine 1.96, patient was admitted for further workup.    Assessment & Plan:   Principal Problem:   Nausea & vomiting Active Problems:   CAD - moderate at cath July 2012 (medical Rx)   Hypothyroid   Dyslipidemia   Diabetes mellitus type 2 with peripheral artery disease (HCC)   Chronic diastolic heart failure (HCC)   Anemia of chronic disease   Malnutrition of moderate degree (HCC)   Uncontrolled hypertension   CKD (chronic kidney disease), stage IV (HCC)   FTT (failure to thrive) in adult   Metabolic acidosis  Nausea &vomiting likely due to esophageal dysmotility -Patient presented with 2 day history of nausea and vomiting primarily of small amounts of food and all of her medications,this has been followed by abdominal pain was preceded with anorexia -Abdominal films unremarkable. No leukocytosis;urinalysis unremarkable -DG esophagogram, showed esophageal dysmotility with distal esophageal diverticulum, no stricture or obstruction. -  Placed on Reglan with some clinical improvement, follow QTC closely - Patient also found to have profound hypothyroidism, which may have caused slowed motility as well. Synthroid dose has been adjusted. - Patient currently tolerating soft diet. Will place on Reglan 5 mg before meals at bedtime. Repeat EKG in the morning to follow QTC.   Active Problems: Uncontrolled hypertension -Ongoing issue in the outpatient setting and made worse by inability to keep down home medications  - Labetalol 20 mg IV 1  -No beta blocker secondary to prior symptomatic bradycardia in past. -Will discontinue IV Lopressor. Increase hydralazine to home dose of 50 mg twice daily. Resume home dose Norvasc at 10 mg daily. Resume half home dose Lasix.  CKD (chronic kidney disease), stage IV  -Renal function stable and at baseline with BUN 17 and creatinine 1. 70. -Lasix on hold, will resume Lasix today. -Restart Preadmission Renvalaand Rocaltrol  -Follow as outpatient renal, Dr Deterding   FTT (failure to thrive) in adult/Malnutrition of moderate degree  - multifactorial due to esophageal dysmotility, profound hypothyroidism, chronic kidney disease. Patient has an advanced to a soft diet which seems to be tolerating.  - Nutrition consult, PTOT  NAGMetabolic acidosis -Secondary to chronicity severity of kidney disease, nausea and vomiting  - Continue gentle hydration   - Started on bicarbonate tablets  CAD - moderate at cath July 2012 (medical Rx) -Currently asymptomatic -No beta blocker secondary to symptomatic bradycardia;due to reports of suspected tachycardia prior to admission that may have been atrial fibrillation (see outpatient cardiology notes). -Recurrent nausea and vomiting so preadmission Plavix and aspirin as well as statin on hold -Have converted Imdur to Nitro-Dur patch  for now. Discontinue IV metoprolol that was started yesterday due to symptomatic bradycardia in the past. Will  resume aspirin and Plavix.  Diabetes mellitus type 2 with peripheral artery disease  -On Lantus at home. CBGs have ranged from 148-189. Hemoglobin A1c was 8.4. Continue sliding scale insulin.  Chronic diastolic heart failure  -Currently compensated without evidence of heart failure, Hold Lasix -No ARB/ACE I due to renal insufficiency  Profound hypothyroidism -TSH >90, last in 2016 was 30 - Increased Synthroid, placed on IV Synthroid 50 MCG daily, will transition to oral 100 MCG daily was tolerating oral intake. Patient will likely need repeat thyroid function studies done in about 4-6 weeks.  Dyslipidemia -Statin on hold as above  Anemia of chronic disease -Hemoglobin stable and near baseline at 10.3. No overt bleeding. Follow H&H.    DVT prophylaxis: Lovenox Code Status: Full Family Communication: Updated patient and son at bedside. Disposition Plan: Home when tolerating oral intake.   Consultants:   None  Procedures:   Abdominal x-ray 11/08/2015  Esophagram 11/09/2015  Antimicrobials:   None   Subjective: Patient sitting at bedside with son feeding her and tolerating current oral intake. Patient denies any chest pain. No shortness of breath.  Objective: Vitals:   11/11/15 0235 11/11/15 0612 11/11/15 0638 11/11/15 1048  BP: (!) 166/72 (!) 174/70 (!) 155/64 (!) 199/95  Pulse: 92 91    Resp:  20    Temp:  98.7 F (37.1 C)    TempSrc:  Oral    SpO2:  99%    Weight:  57.6 kg (126 lb 14.4 oz)    Height:        Intake/Output Summary (Last 24 hours) at 11/11/15 1229 Last data filed at 11/11/15 1047  Gross per 24 hour  Intake              223 ml  Output                0 ml  Net              223 ml   Filed Weights   11/09/15 0431 11/10/15 0443 11/11/15 0612  Weight: 52.9 kg (116 lb 11.2 oz) 52.7 kg (116 lb 3.2 oz) 57.6 kg (126 lb 14.4 oz)    Examination:  General exam: Appears calm and comfortable  Respiratory system: Clear to  auscultation. Respiratory effort normal. Cardiovascular system: S1 & S2 heard, RRR. No JVD, murmurs, rubs, gallops or clicks. No pedal edema. Gastrointestinal system: Abdomen is nondistended, soft and nontender. No organomegaly or masses felt. Normal bowel sounds heard. Central nervous system: Alert and oriented. No focal neurological deficits. Extremities: Symmetric 5 x 5 power. Skin: No rashes, lesions or ulcers Psychiatry: Judgement and insight appear normal. Mood & affect appropriate.     Data Reviewed: I have personally reviewed following labs and imaging studies  CBC:  Recent Labs Lab 11/08/15 1610 11/09/15 0546 11/11/15 0304  WBC 7.5 7.7 8.1  NEUTROABS 5.0  --   --   HGB 10.5* 10.6* 10.3*  HCT 33.9* 34.8* 33.5*  MCV 93.1 94.3 93.8  PLT 321 335 292   Basic Metabolic Panel:  Recent Labs Lab 11/08/15 1409 11/09/15 0546 11/10/15 0240 11/11/15 0304  NA 142 143 144 141  K 5.5* 4.4 4.4 4.7  CL 111 115* 119* 117*  CO2 19* 17* 14* 15*  GLUCOSE 199* 174* 145* 168*  BUN 30* 26* 22* 17  CREATININE 1.96* 1.92* 1.91* 1.70*  CALCIUM 9.4 8.8*  8.8* 8.3*   GFR: Estimated Creatinine Clearance: 17.5 mL/min (by C-G formula based on SCr of 1.7 mg/dL). Liver Function Tests:  Recent Labs Lab 11/08/15 1409  AST 49*  ALT 35  ALKPHOS 104  BILITOT 0.5  PROT 7.7  ALBUMIN 3.4*   No results for input(s): LIPASE, AMYLASE in the last 168 hours. No results for input(s): AMMONIA in the last 168 hours. Coagulation Profile:  Recent Labs Lab 11/08/15 1530  INR 1.12   Cardiac Enzymes:  Recent Labs Lab 11/08/15 2032  CKTOTAL 9*   BNP (last 3 results) No results for input(s): PROBNP in the last 8760 hours. HbA1C:  Recent Labs  11/08/15 2230  HGBA1C 8.4*   CBG:  Recent Labs Lab 11/10/15 0325 11/10/15 1709 11/10/15 2209 11/11/15 0628 11/11/15 1118  GLUCAP 170* 192* 189* 155* 150*   Lipid Profile: No results for input(s): CHOL, HDL, LDLCALC, TRIG, CHOLHDL,  LDLDIRECT in the last 72 hours. Thyroid Function Tests:  Recent Labs  11/08/15 2230  TSH >90.000*   Anemia Panel: No results for input(s): VITAMINB12, FOLATE, FERRITIN, TIBC, IRON, RETICCTPCT in the last 72 hours. Sepsis Labs:  Recent Labs Lab 11/08/15 1414 11/08/15 1541  LATICACIDVEN 2.28* 1.57    Recent Results (from the past 240 hour(s))  Urine culture     Status: None   Collection Time: 11/08/15  2:51 PM  Result Value Ref Range Status   Specimen Description URINE, RANDOM  Final   Special Requests NONE  Final   Culture NO GROWTH  Final   Report Status 11/09/2015 FINAL  Final         Radiology Studies: No results found.      Scheduled Meds: . amLODipine  5 mg Oral Daily  . enoxaparin (LOVENOX) injection  30 mg Subcutaneous Q24H  . famotidine (PEPCID) IV  20 mg Intravenous Q24H  . feeding supplement  1 Container Oral TID BM  . hydrALAZINE  50 mg Oral BID  . insulin aspart  0-9 Units Subcutaneous TID WC  . levothyroxine  50 mcg Intravenous Daily  . metoCLOPramide  5 mg Oral TID AC & HS  . nitroGLYCERIN  0.4 mg Transdermal Daily  . sodium chloride flush  3 mL Intravenous Q12H   Continuous Infusions:    LOS: 2 days    Time spent: 35 minutes    Tawn Fitzner, MD [facility] Pager [phone]  If 7PM-7AM, please contact night-coverage [email or web address] Password [facility] 11/11/2015, 12:29 PM

## 2015-11-12 DIAGNOSIS — E785 Hyperlipidemia, unspecified: Secondary | ICD-10-CM

## 2015-11-12 LAB — BASIC METABOLIC PANEL
ANION GAP: 8 (ref 5–15)
BUN: 18 mg/dL (ref 6–20)
CALCIUM: 8.9 mg/dL (ref 8.9–10.3)
CO2: 18 mmol/L — ABNORMAL LOW (ref 22–32)
Chloride: 113 mmol/L — ABNORMAL HIGH (ref 101–111)
Creatinine, Ser: 1.92 mg/dL — ABNORMAL HIGH (ref 0.44–1.00)
GFR, EST AFRICAN AMERICAN: 25 mL/min — AB (ref >60)
GFR, EST NON AFRICAN AMERICAN: 22 mL/min — AB (ref >60)
GLUCOSE: 158 mg/dL — AB (ref 65–99)
POTASSIUM: 4.3 mmol/L (ref 3.5–5.1)
SODIUM: 139 mmol/L (ref 135–145)

## 2015-11-12 LAB — GLUCOSE, CAPILLARY
GLUCOSE-CAPILLARY: 157 mg/dL — AB (ref 65–99)
Glucose-Capillary: 296 mg/dL — ABNORMAL HIGH (ref 65–99)
Glucose-Capillary: 120 mg/dL — ABNORMAL HIGH (ref 65–99)
Glucose-Capillary: 149 mg/dL — ABNORMAL HIGH (ref 65–99)

## 2015-11-12 MED ORDER — INSULIN GLARGINE 100 UNIT/ML ~~LOC~~ SOLN
10.0000 [IU] | Freq: Every evening | SUBCUTANEOUS | Status: DC
  Administered 2015-11-12: 10 [IU] via SUBCUTANEOUS
  Filled 2015-11-12 (×2): qty 0.1

## 2015-11-12 MED ORDER — SODIUM BICARBONATE 650 MG PO TABS
650.0000 mg | ORAL_TABLET | Freq: Two times a day (BID) | ORAL | Status: DC
  Administered 2015-11-12: 650 mg via ORAL
  Filled 2015-11-12 (×3): qty 1

## 2015-11-12 MED ORDER — ISOSORBIDE MONONITRATE ER 60 MG PO TB24
60.0000 mg | ORAL_TABLET | Freq: Every day | ORAL | Status: DC
  Administered 2015-11-13: 60 mg via ORAL
  Filled 2015-11-12: qty 1

## 2015-11-12 MED ORDER — FAMOTIDINE 20 MG PO TABS
20.0000 mg | ORAL_TABLET | Freq: Every day | ORAL | Status: DC
  Filled 2015-11-12: qty 1

## 2015-11-12 MED ORDER — LEVOTHYROXINE SODIUM 100 MCG PO TABS
100.0000 ug | ORAL_TABLET | Freq: Every day | ORAL | Status: DC
  Administered 2015-11-12 – 2015-11-13 (×2): 100 ug via ORAL
  Filled 2015-11-12 (×2): qty 1

## 2015-11-12 NOTE — Progress Notes (Signed)
Key Points: Use following P&T approved IV to PO antibiotic change policy.  Description contains the criteria that are approved Note: Policy Excludes:  Esophagectomy patientsPHARMACIST - PHYSICIAN COMMUNICATION  CONCERNING: IV to Oral Route Change Policy  RECOMMENDATION: This patient is receiving pepcid and synthroid by the intravenous route.  Based on criteria approved by the Pharmacy and Therapeutics Committee, the intravenous medication(s) is/are being converted to the equivalent oral dose form(s).   DESCRIPTION: These criteria include:  The patient is eating (either orally or via tube) and/or has been taking other orally administered medications for a least 24 hours  The patient has no evidence of active gastrointestinal bleeding or impaired GI absorption (gastrectomy, short bowel, patient on TNA or NPO).  If you have questions about this conversion, please contact the Pharmacy Department  []   [phone] )  [facility] []   [phone] )  [facility] [x]   [phone] )  [facility] []   [phone] )  [facility] []   [phone] )  [facility]   Harland German, [place] D 11/12/2015 8:37 AM

## 2015-11-12 NOTE — Care Management Important Message (Signed)
Important Message  Patient Details  Name: [name] MRN: [id number] Date of Birth: [birth year]   Medicare Important Message Given:  Yes    Teasia Zapf Abena 11/12/2015, 11:28 AM

## 2015-11-12 NOTE — Evaluation (Signed)
Physical Therapy Evaluation Patient Details Name: Shannon SirenRoberta G [name] MRN: [id number] DOB: 04/02/1923 Today's Date: 11/12/2015   History of Present Illness  82yo admitted with nausea and vomiting, FTT. PMHx: Lt BKA, CKD, CAD, PVD, DM, Heart failure, anemia, FTT  Clinical Impression  Pt with flat affect and delayed response to questions. Son present throughout to provide home setup and PLOF. Son is pt's caregiver and performs all mobility and ADLs for her. Son has his own physical limitations and uses a cane however states he has gotten rid of sliding board and hoyer lift because he didn't like them and is currently performing physical lifting of pt for transfers. Son educated for safe techniques for pivots, equipment setup and safety for his and pt health. Son and pt deny further therapy need as pt is near baseline and son intends to continue to provide total care. Pt with decreased strength, balance, mobility and activity tolerance. Education completed and son declined additional equipment options. HH aide would be beneficial. Will sign off due to no further acute needs with pt and son aware and agreeable. Recommend daily mobility with nursing staff and use of lift equipment for OOB.     Follow Up Recommendations No PT follow up;Supervision/Assistance - 24 hour;Other (comment) (HH aide)    Equipment Recommendations  Other (comment) (recommend hoyer lift however son declines)    Recommendations for Other Services       Precautions / Restrictions Precautions Precautions: Fall Precaution Comments: Lt BKA      Mobility  Bed Mobility Overal bed mobility: Needs Assistance Bed Mobility: Rolling;Sidelying to Sit;Sit to Supine Rolling: Max assist Sidelying to sit: Max assist   Sit to supine: Total assist   General bed mobility comments: pt required assist of pad to rotate pelvis to roll, assist to elevate trunk and bring legs off of bed and assist to return to supine. [facility]  Transfers Overall transfer level: Needs assistance   Transfers: Squat Pivot Transfers     Squat pivot transfers: Total assist     General transfer comment: performed squat pivot bed to chair with knee blocked and use of belt. Pt not following commands for RLE utilization, posture or weight bearing. Assisted son with cueing for transfer back with squat pivot also with blocking. Son educated for over the back bobath pivot technique as well as utilization of pad or draw sheet and belt to assist with transfers.   Ambulation/Gait                Stairs            Wheelchair Mobility    Modified Rankin (Stroke Patients Only)       Balance Overall balance assessment: Needs assistance   Sitting balance-Leahy Scale: Poor       Standing balance-Leahy Scale: Zero                               Pertinent Vitals/Pain Pain Assessment: 0-10 Pain Score: 3  Pain Location: legs Pain Descriptors / Indicators: Sore Pain Intervention(s): Repositioned;Limited activity within patient's tolerance    Home Living Family/patient expects to be discharged to:: Private residence Living Arrangements: Children Available Help at Discharge: Family;Available 24 hours/day Type of Home: House Home Access: Ramped entrance     Home Layout: One level Home Equipment: Walker - 2 wheels;Bedside commode;Tub bench;Hospital bed;Wheelchair - manual      Prior Function Level of  Independence: Needs assistance   Gait / Transfers Assistance Needed: pt has had progressive decline to the point she is a max-total assist for pivot transfers only and OOB  ADL's / Homemaking Assistance Needed: son bathes and dresses her as well as all the housework        Hand Dominance        Extremity/Trunk Assessment   Upper Extremity Assessment: Generalized weakness           Lower Extremity Assessment: Generalized weakness      Cervical / Trunk Assessment: Kyphotic   Communication   Communication: No difficulties  Cognition Arousal/Alertness: Awake/alert Behavior During Therapy: Flat affect Overall Cognitive Status: Impaired/Different from baseline Area of Impairment: Memory;Following commands;Safety/judgement       Following Commands: Follows one step commands with increased time;Follows one step commands inconsistently Safety/Judgement: Decreased awareness of deficits     General Comments: pt with decreased command following and response to questions which son reports is more delayed and difficult than normal for her    General Comments      Exercises        Assessment/Plan    PT Assessment Patent does not need any further PT services  PT Diagnosis Generalized weakness;Difficulty walking   PT Problem List    PT Treatment Interventions     PT Goals (Current goals can be found in the Care Plan section) Acute Rehab PT Goals PT Goal Formulation: All assessment and education complete, DC therapy    Frequency     Barriers to discharge        Co-evaluation               End of Session Equipment Utilized During Treatment: Gait belt Activity Tolerance: Patient tolerated treatment well Patient left: in bed;with call bell/phone within reach;with family/visitor present Nurse Communication: Mobility status         Time: 1351-1415 PT Time Calculation (min) (ACUTE ONLY): 24 min   Charges:   PT Evaluation $PT Eval Moderate Complexity: 1 Procedure PT Treatments $Therapeutic Activity: 8-22 mins   PT G CodesDelorse Lek 11/12/2015, 2:36 PM Delaney Meigs, PT [phone]

## 2015-11-12 NOTE — Progress Notes (Signed)
I have assumed care of this pt. Pt has no complaints at this time. Pt is resting comfortably in bed with call light within reach. Will continue care.   Berdine Dance BSN, RN

## 2015-11-12 NOTE — Progress Notes (Signed)
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 Patient has been unable to keep down any of her medications and on the first day of symptoms she was not eating and had clear emesis.   She was evaluated in the ER on 714 and vomiting and abdominal pain workup at that time was unrevealing including a negative CT of the abdomen and pelvis. BP was 196/102, potassium 5.5, CO2 19, creatinine 1.96, patient was admitted for further workup.    Assessment & Plan:   Principal Problem:   Nausea & vomiting Active Problems:   CAD - moderate at cath July 2012 (medical Rx)   Hypothyroid   Dyslipidemia   Diabetes mellitus type 2 with peripheral artery disease (HCC)   Chronic diastolic heart failure (HCC)   Anemia of chronic disease   Malnutrition of moderate degree (HCC)   Uncontrolled hypertension   CKD (chronic kidney disease), stage IV (HCC)   FTT (failure to thrive) in adult   Metabolic acidosis  Nausea &vomiting likely due to esophageal dysmotility -Patient presented with 2 day history of nausea and vomiting primarily of small amounts of food and all of her medications,this has been followed by abdominal pain was preceded with anorexia -Abdominal films unremarkable. No leukocytosis;urinalysis unremarkable -DG esophagogram, showed esophageal dysmotility with distal esophageal diverticulum, no stricture or obstruction. -  Placed on Reglan with some clinical improvement, follow QTC closely - Patient also found to have profound hypothyroidism, which may have caused slowed motility as well. Synthroid dose has been adjusted. - Patient currently tolerating soft diet. Continue Reglan 5 mg before meals at bedtime. Repeat EKG with no QTC prolongation.   Active Problems: Uncontrolled hypertension -Ongoing issue in the outpatient setting and made worse by inability to keep down home medications  - Labetalol 20 mg IV 1  -No beta blocker secondary to prior symptomatic bradycardia in past. -Discontinued IV Lopressor. Increased hydralazine to home dose of 50 mg twice daily. Resumed home dose Norvasc at 10 mg daily. Resumed half home dose Lasix. Follow.  CKD (chronic kidney disease), stage IV  -Renal function stable and at baseline with BUN 17 and creatinine 1. 70. -Lasix have been resumed. -Restart Preadmission Renvalaand Rocaltrol  -Follow as outpatient renal, Dr Deterding   FTT (failure to thrive) in adult/Malnutrition of moderate degree  - multifactorial due to esophageal dysmotility, profound hypothyroidism, chronic kidney disease. Patient has been advanced to a soft diet which she seems to be tolerating.  - Nutrition consult, PTOT  NAGMetabolic acidosis -Secondary to chronicity severity of kidney disease, nausea and vomiting  - Continue gentle hydration   - Started on bicarbonate tablets  CAD - moderate at cath July 2012 (medical Rx) -Currently asymptomatic -No beta blocker secondary to symptomatic bradycardia;due to reports of suspected tachycardia prior to admission that may have been atrial fibrillation (see outpatient cardiology notes). -Recurrent nausea and vomiting so preadmission Plavix and aspirin as well as statin were held initially. Plavix and aspirin have been resumed. -Will discontinue Nitropatch and resume  home dose Imdur. Discontinued IV metoprolol due to history of symptomatic  bradycardia in the past.   Diabetes mellitus type 2 with peripheral artery disease  -On Lantus at home. CBGs have ranged from 120-296. Hemoglobin A1c was 8.4. Continue sliding scale insulin. Resume approximately half home dose Lantus.  Chronic diastolic heart failure  -Currently compensated without evidence of heart failure, resume Lasix today and monitor renal function.  -No ARB/ACE I due to renal insufficiency  Profound hypothyroidism -TSH >90, last in 2016 was 30 - Increased Synthroid, placed on IV Synthroid 50 MCG daily, will transition to oral 100 MCG daily was tolerating oral intake. Patient will likely need repeat thyroid function studies done in about 4-6 weeks.  Dyslipidemia -Statin on hold as above  Anemia of chronic disease -Hemoglobin stable and near baseline at 10.3. No overt bleeding. Follow H&H.    DVT prophylaxis: Lovenox Code Status: Full Family Communication: Updated patient and son at bedside. Disposition Plan: Home when tolerating oral intake, hopefully tomorrow.   Consultants:   None  Procedures:   Abdominal x-ray 11/08/2015  Esophagram 11/09/2015  Antimicrobials:   None   Subjective: Patient sitting up in bed, with son feeding her and tolerating current oral intake. Patient denies any chest pain. No shortness of breath.  Objective: Vitals:   11/11/15 2136 11/12/15 0300 11/12/15 0421 11/12/15 0900  BP: (!) 162/86  (!) 172/87 (!) 184/90  Pulse: (!) 103  98 (!) 103  Resp: 18  18   Temp: 97.5 F (36.4 C)  98.6 F (37 C) 97.2 F (36.2 C)  TempSrc: Oral  Axillary Oral  SpO2: 100%  100%   Weight:  55.1 kg (121 lb 8 oz)    Height:        Intake/Output Summary (Last 24 hours) at 11/12/15 1154 Last data filed at 11/12/15 1005  Gross per 24 hour  Intake              603 ml  Output                0 ml  Net              603 ml   Filed Weights   11/10/15 0443 11/11/15 0612 11/12/15 0300  Weight: 52.7 kg (116 lb 3.2 oz) 57.6  kg (126 lb 14.4 oz) 55.1 kg (121 lb 8 oz)    Examination:  General exam: Appears calm and comfortable  Respiratory system: Clear to auscultation anterior lung fields. Respiratory effort normal. Cardiovascular system: S1 & S2 heard, RRR. No JVD, murmurs, rubs, gallops or clicks. No pedal edema. Gastrointestinal system: Abdomen is nondistended, soft and nontender. No organomegaly or masses felt. Normal bowel sounds heard. Central nervous system: Alert and oriented. No focal neurological deficits. Extremities: Symmetric 5 x 5 power. Skin: No rashes, lesions or ulcers Psychiatry: Judgement and insight appear normal. Mood & affect appropriate.     Data Reviewed: I have personally reviewed following labs and imaging studies  CBC:  Recent Labs Lab 11/08/15 1610 11/09/15 0546 11/11/15 0304  WBC 7.5 7.7 8.1  NEUTROABS 5.0  --   --   HGB 10.5* 10.6* 10.3*  HCT 33.9* 34.8* 33.5*  MCV 93.1 94.3 93.8  PLT 321 335 292   Basic Metabolic Panel:  Recent Labs Lab 11/08/15 1409 11/09/15 0546 11/10/15 0240 11/11/15 0304 11/12/15 0742  NA 142 143 144 141 139  K 5.5* 4.4 4.4 4.7 4.3  CL 111 115* 119* 117* 113*  CO2 19*  17* 14* 15* 18*  GLUCOSE 199* 174* 145* 168* 158*  BUN 30* 26* 22* 17 18  CREATININE 1.96* 1.92* 1.91* 1.70* 1.92*  CALCIUM 9.4 8.8* 8.8* 8.3* 8.9   GFR: Estimated Creatinine Clearance: 15.2 mL/min (by C-G formula based on SCr of 1.92 mg/dL). Liver Function Tests:  Recent Labs Lab 11/08/15 1409  AST 49*  ALT 35  ALKPHOS 104  BILITOT 0.5  PROT 7.7  ALBUMIN 3.4*   No results for input(s): LIPASE, AMYLASE in the last 168 hours. No results for input(s): AMMONIA in the last 168 hours. Coagulation Profile:  Recent Labs Lab 11/08/15 1530  INR 1.12   Cardiac Enzymes:  Recent Labs Lab 11/08/15 2032  CKTOTAL 9*   BNP (last 3 results) No results for input(s): PROBNP in the last 8760 hours. HbA1C: No results for input(s): HGBA1C in the last 72  hours. CBG:  Recent Labs Lab 11/11/15 1118 11/11/15 1626 11/11/15 2132 11/12/15 0634 11/12/15 1111  GLUCAP 150* 148* 146* 157* 296*   Lipid Profile: No results for input(s): CHOL, HDL, LDLCALC, TRIG, CHOLHDL, LDLDIRECT in the last 72 hours. Thyroid Function Tests: No results for input(s): TSH, T4TOTAL, FREET4, T3FREE, THYROIDAB in the last 72 hours. Anemia Panel: No results for input(s): VITAMINB12, FOLATE, FERRITIN, TIBC, IRON, RETICCTPCT in the last 72 hours. Sepsis Labs:  Recent Labs Lab 11/08/15 1414 11/08/15 1541  LATICACIDVEN 2.28* 1.57    Recent Results (from the past 240 hour(s))  Urine culture     Status: None   Collection Time: 11/08/15  2:51 PM  Result Value Ref Range Status   Specimen Description URINE, RANDOM  Final   Special Requests NONE  Final   Culture NO GROWTH  Final   Report Status 11/09/2015 FINAL  Final         Radiology Studies: No results found.      Scheduled Meds: . amLODipine  10 mg Oral Daily  . aspirin EC  81 mg Oral BH-q7a  . calcitRIOL  0.25 mcg Oral QODAY  . calcium-vitamin D  1 tablet Oral Daily  . clopidogrel  75 mg Oral QPM  . enoxaparin (LOVENOX) injection  30 mg Subcutaneous Q24H  . famotidine  20 mg Oral QHS  . feeding supplement  1 Container Oral TID BM  . furosemide  40 mg Oral BID  . hydrALAZINE  50 mg Oral BID  . insulin aspart  0-9 Units Subcutaneous TID WC  . levothyroxine  100 mcg Oral QAC breakfast  . metoCLOPramide  5 mg Oral TID AC & HS  . nitroGLYCERIN  0.4 mg Transdermal Daily  . sevelamer carbonate  800 mg Oral TID WC  . sodium bicarbonate  650 mg Oral BID  . sodium chloride flush  3 mL Intravenous Q12H   Continuous Infusions:    LOS: 3 days    Time spent: 35 minutes    THOMPSON,DANIEL, MD [facility] Pager [phone]  If 7PM-7AM, please contact night-coverage [email or web address] Password [email or web address] 11/12/2015, 11:54 AM

## 2015-11-13 DIAGNOSIS — R1310 Dysphagia, unspecified: Secondary | ICD-10-CM

## 2015-11-13 LAB — CBC
HCT: 33.2 % — ABNORMAL LOW (ref 36.0–46.0)
Hemoglobin: 10.3 g/dL — ABNORMAL LOW (ref 12.0–15.0)
MCH: 28.5 pg (ref 26.0–34.0)
MCHC: 31 g/dL (ref 30.0–36.0)
MCV: 91.7 fL (ref 78.0–100.0)
PLATELETS: 296 10*3/uL (ref 150–400)
RBC: 3.62 MIL/uL — ABNORMAL LOW (ref 3.87–5.11)
RDW: 16.1 % — AB (ref 11.5–15.5)
WBC: 7 10*3/uL (ref 4.0–10.5)

## 2015-11-13 LAB — BASIC METABOLIC PANEL
Anion gap: 9 (ref 5–15)
BUN: 17 mg/dL (ref 6–20)
CHLORIDE: 107 mmol/L (ref 101–111)
CO2: 20 mmol/L — ABNORMAL LOW (ref 22–32)
CREATININE: 1.94 mg/dL — AB (ref 0.44–1.00)
Calcium: 8.9 mg/dL (ref 8.9–10.3)
GFR calc Af Amer: 25 mL/min — ABNORMAL LOW (ref >60)
GFR calc non Af Amer: 22 mL/min — ABNORMAL LOW (ref >60)
Glucose, Bld: 145 mg/dL — ABNORMAL HIGH (ref 65–99)
Potassium: 4.6 mmol/L (ref 3.5–5.1)
SODIUM: 136 mmol/L (ref 135–145)

## 2015-11-13 LAB — GLUCOSE, CAPILLARY
Glucose-Capillary: 129 mg/dL — ABNORMAL HIGH (ref 65–99)
Glucose-Capillary: 216 mg/dL — ABNORMAL HIGH (ref 65–99)

## 2015-11-13 MED ORDER — FAMOTIDINE 20 MG PO TABS: 20.0000 mg | ORAL_TABLET | Freq: Every day | ORAL | 3 refills | Status: AC

## 2015-11-13 MED ORDER — METOCLOPRAMIDE HCL 5 MG PO TABS: 5.0000 mg | ORAL_TABLET | Freq: Three times a day (TID) | ORAL | 3 refills | Status: DC

## 2015-11-13 MED ORDER — BOOST / RESOURCE BREEZE PO LIQD: 1.0000 | Freq: Three times a day (TID) | ORAL | 0 refills | Status: DC

## 2015-11-13 MED ORDER — SODIUM BICARBONATE 650 MG PO TABS: 650.0000 mg | ORAL_TABLET | Freq: Every day | ORAL | 0 refills | Status: AC

## 2015-11-13 MED ORDER — LEVOTHYROXINE SODIUM 100 MCG PO TABS: 100.0000 ug | ORAL_TABLET | Freq: Every day | ORAL | 3 refills | Status: DC

## 2015-11-13 NOTE — Care Management Note (Signed)
Case Management Note Donn Pierini RN, BSN Unit 2W-Case Manager [phone]  Patient Details  Name: [name] MRN: [id number] Date of Birth: 30-Nov-1923  Subjective/Objective:     Pt admitted with N/V               Action/Plan: PTA pt lived at home with son- who provides total care for the patient- PT/OT evals have been completed- on recommendations for PT/OT f/u as pt is close to baseline however pt could benefit from HHRN/aide- orders have been placed- spoke with pt and son at bedside- son is agreeable to RN but not sure he wants the aide and is going to think on that and let agency know- choice offered for [facility] agency in [facility]- per son they have used [facility] in past and would like to use them again for services- son states that they have all needed DME at this time and do not want a hoyer lift again- referral called to Tiffany with [facility] for [facility] (disease management) and aide- per son pt will need non emergent transportation home- [facility] will be called  By bedside RN when pt ready- transport form placed on shadow chart  Expected Discharge Date:    11/13/15              Expected Discharge Plan:  Home w Home Health Services  In-House Referral:     Discharge planning Services  CM Consult  Post Acute Care Choice:  Home Health Choice offered to:  Patient, Adult Children  DME Arranged:    DME Agency:     HH Arranged:  RN, Nurse's Aide HH Agency:  [facility]  Status of Service:  Completed, signed off  If discussed at Long Length of Stay Meetings, dates discussed:    Additional Comments:  Darrold Span, RN 11/13/2015, 11:49 AM

## 2015-11-13 NOTE — Progress Notes (Signed)
OT Screen/Cancellation Note  Patient Details Name: [name] MRN: [id number] DOB: 10/31/1923   OccupationalTherapy Screen  Occupational Therapy orders received and chart reviewed. Per chart review, prior to this admission, pt is total care for ADL's and functional transfers. She lives with son whom is primary caregiver. Pt required assistance for tray set up and feeding this morning for safety noted, nursing staff was present to assist after set-up by OT. Pt confirms that she lives with her son and the he is caregiver. Please see Physical Therapy Evaluation for specific details as son currently denies any further OT/PT needs. Pt/family would benefit from home health aide if agreeable to assist with daily care and activities. Will sign off acute OT at this time. Screen only.        Mariam Dollar Beth Dixon, OTR/L 11/13/2015, 9:42 AM

## 2015-11-13 NOTE — Progress Notes (Signed)
Order received to discharge Pt.  Telemetry removed and CCMD notified. IV removed with catheter intact.  Discharge education given to Pt's son/caretaker.  Handout given on Reglan and Sodium bicarb.  All questions answered.  Pt stable, no c/o heartburn or N/V.  Pt stable to discharge.

## 2015-11-13 NOTE — Discharge Summary (Signed)
Physician Discharge Summary  [name] ZOX:[id number] DOB: [birth year] DOA: 11/08/2015  PCP: Georgianne Fick, MD  Admit date: 11/08/2015 Discharge date: 11/13/2015  Time spent: 65 minutes  Recommendations for Outpatient Follow-up:  1. Follow-up with [facility], MD in 2 weeks. On follow-up patient's blood pressure need to be reassessed and medication doses adjusted appropriately. Patient also needs a basic metabolic profile done to follow-up on electrolytes and renal function. Patient will also need repeat thyroid function studies done in about 4-6 weeks as patient's Synthroid dose was adjusted and increased during this hospitalization. 2. Follow-up with Dr. Darrick Penna of nephrology as scheduled.   Discharge Diagnoses:  Principal Problem:   Nausea & vomiting Active Problems:   CAD - moderate at cath July 2012 (medical Rx)   Hypothyroid   Dyslipidemia   Diabetes mellitus type 2 with peripheral artery disease (HCC)   Chronic diastolic heart failure (HCC)   Anemia of chronic disease   Malnutrition of moderate degree (HCC)   Uncontrolled hypertension   CKD (chronic kidney disease), stage IV (HCC)   FTT (failure to thrive) in adult   Metabolic acidosis   Discharge Condition: Stable and improved  Diet recommendation: Carb modified diet/soft diet  Filed Weights   11/10/15 0443 11/11/15 0612 11/12/15 0300  Weight: 52.7 kg (116 lb 3.2 oz) 57.6 kg (126 lb 14.4 oz) 55.1 kg (121 lb 8 oz)    History of present illness:  Per Dr Kerin Salen is a 82 y.o. female with medical history significant for stage IV chronic kidney disease, CAD medical therapy, chronic diastolic heart failure, diabetes, peripheral vascular disease, anemia of chronic disease, dyslipidemia, failure to thrive and moderate malnutrition, history of prior esophageal food impaction, history of bradycardia induced by beta blockers, history of myxedema cardiomyopathy in 2014 who presented to the ER with 2  days of nausea and vomiting and generalized abdominal pain. Patient had been unable to keep down any of her medications and on the first day of symptoms she was not eating and had clear emesis. On the day of admission, she drank chocolate milk and immediately vomited this back up. She has not had any fevers or chills. She's had loose stools but not any watery diarrhea. She's had no sick contacts. Her last BM was a small amount overnight and this was loose. She was evaluated in the ER on 7/14 and vomiting and abdominal pain workup at that time was unrevealing including a negative CT of the abdomen and pelvis. Of note in review of outpatient records patient has had difficult to control hypertension and her cardiologist has been adjusting her medications initially adding Norvasc and titrating upward and most recently hydralazine has been added and was titrated up last month. There was also some question in the cardiology notes as to whether patient had been having some tachycardia since initiation of hydralazine.  Hospital Course:  Nausea &vomiting likely due to esophageal dysmotility -Patient presented with 2 day history of nausea and vomiting primarily of small amounts of food and all of her medications,this has been followed by abdominal pain was preceded with anorexia -Abdominal films unremarkable. No leukocytosis;urinalysis unremarkable -DG esophagogram, showed esophageal dysmotility with distal esophageal diverticulum, no stricture or obstruction. - Placed on Reglan with clinical improvement. EKG was done and patient's QTC was not prolonged. - Patient also found to have profound hypothyroidism, which may have caused slowed motility as well. Synthroid dose has been adjusted. - Patient improved clinically after being started on Reglan IV and subsequently transitioned  to oral Reglan 5 mg before meals at bedtime. Repeat EKG showed no QTC prolongation. Patient's diet was subsequently advanced to a soft  diet which she continued to tolerate throughout the hospitalization. Patient be discharged home on Reglan 5 mg before meals and at bedtime. Outpatient follow-up.   Active Problems: Uncontrolled hypertension -Ongoing issue in the outpatient setting and made worse by inability to keep down home medications  - Labetalol 20 mg IV 1  -No beta blocker secondary to prior symptomatic bradycardia in past. -Discontinued IV Lopressor. As patient's oral intake improved and nausea vomiting subsided and subsequently resolved patient was started back on hydralazine and dose increased back to home dose of 50 mg daily. Patient's home regimen of Norvasc at 10 mg daily was resumed as well as half home dose Lasix with better blood pressure control. Outpatient follow-up.   CKD (chronic kidney disease), stage IV  -Renal function stable and at baseline with BUN 17 and creatinine 1. 70. -Lasix was initially held due to patient's poor oral intake however as patient is nausea vomiting resolved Lasix was resumed. Patient's Rocaltrol and Renvela was also resumed. -Follow as outpatient renal, Dr Deterding   FTT (failure to thrive) in adult/Malnutrition of moderate degree  - multifactorial due to esophageal dysmotility, profound hypothyroidism, chronic kidney disease. Patient during the hospitalization as patient's nausea and vomiting improved her diet was advanced to a soft diet which she tolerated. Patient was seen by nutrition and placed on nutritional supplement patient. Patient was also seen by physical therapy were recommended skilled nursing facility however patient opted to be discharged home with her son who was providing 24-hour supervision and excellent care of his mother at home.   NAGMetabolic acidosis -Secondary to chronicity severity of kidney disease, nausea and vomiting  - Patient was placed on gentle hydration as well as started on bicarbonate tablets with improvement with metabolic acidosis.  Outpatient follow-up.  CAD - moderate at cath July 2012 (medical Rx) -Remained asymptomatic -No beta blocker secondary to symptomatic bradycardia;due to reports of suspected tachycardia prior to admission that may have been atrial fibrillation (see outpatient cardiology notes). -Recurrent nausea and vomiting so preadmission Plavix and aspirin as well as statin were held initially. Plavix and aspirin were resumed with resolution of patient's nausea vomiting and intolerance of oral intake. Nitropatch was also discontinued and patient resumed on home dose Imdur. IV metoprolol was initially placed for better blood pressure control however due to patient's prior history of bradycardia this was subsequently discontinued.   Diabetes mellitus type 2 with peripheral artery disease  -On Lantus at home. CBGs have ranged from 120-296. Hemoglobin A1c was 8.4. Patient was maintained on half home dose Lantus as well as sliding scale insulin during the hospitalization.   Chronic diastolic heart failure  -Currently compensated without evidence of heart failure, patient's Lasix were initially held during the hospitalization due to her nausea vomiting however as patient is nausea and vomiting improved as well as her oral intake she was resumed back on a home dose of oral Lasix.   -No ARB/ACE I due to renal insufficiency  Profound hypothyroidism -TSH >90, last in 2016 was 30 - Increased Synthroid, placed on IV Synthroid 50 MCG daily initially during the hospitalization. Once patient is nausea and vomiting had improved patient was subsequently transitioned to oral Synthroid 100 mcg daily. Patient will likely need repeat thyroid function studies done in about 4-6 weeks.  Dyslipidemia -Statin was held during the hospitalization.   Anemia of chronic disease -  Hemoglobin stable and near baseline at 10.3. No overt bleeding.    Procedures:  Abdominal x-ray 11/08/2015  Esophagram  11/09/2015  Consultations:  None  Discharge Exam: Vitals:   11/12/15 2016 11/13/15 0453  BP: (!) 155/74 (!) 181/73  Pulse: 96 95  Resp: 16 16  Temp: 97.5 F (36.4 C) 97.8 F (36.6 C)    General: NAD Cardiovascular: RRR Respiratory: CTAB anterior lung fields.  Discharge Instructions   Discharge Instructions    Diet Carb Modified    Complete by:  As directed   Discharge instructions    Complete by:  As directed   Follow up with [facility], MD in 2 weeks.   Increase activity slowly    Complete by:  As directed     Current Discharge Medication List    START taking these medications   Details  famotidine (PEPCID) 20 MG tablet Take 1 tablet (20 mg total) by mouth at bedtime. Qty: 30 tablet, Refills: 3    feeding supplement (BOOST / RESOURCE BREEZE) LIQD Take 1 Container by mouth 3 (three) times daily between meals. Refills: 0    metoCLOPramide (REGLAN) 5 MG tablet Take 1 tablet (5 mg total) by mouth 4 (four) times daily -  before meals and at bedtime. Qty: 120 tablet, Refills: 3    sodium bicarbonate 650 MG tablet Take 1 tablet (650 mg total) by mouth daily. Qty: 30 tablet, Refills: 0      CONTINUE these medications which have CHANGED   Details  levothyroxine (SYNTHROID, LEVOTHROID) 100 MCG tablet Take 1 tablet (100 mcg total) by mouth daily before breakfast. Qty: 30 tablet, Refills: 3      CONTINUE these medications which have NOT CHANGED   Details  acetaminophen (TYLENOL) 325 MG tablet Take 650 mg by mouth every 6 (six) hours as needed for mild pain.    albuterol-ipratropium (COMBIVENT) 18-103 MCG/ACT inhaler Inhale 2 puffs into the lungs at bedtime as needed for wheezing or shortness of breath.     allopurinol (ZYLOPRIM) 100 MG tablet Take 200 mg by mouth daily.     amLODipine (NORVASC) 10 MG tablet Take 1 tablet (10 mg total) by mouth daily. Qty: 90 tablet, Refills: 1    aspirin EC 81 MG tablet Take 81 mg by mouth every morning.     calcitRIOL  (ROCALTROL) 0.25 MCG capsule Take 0.25 mcg by mouth every other day.     Calcium Carbonate-Vitamin D (CALCIUM-VITAMIN D) 500-200 MG-UNIT tablet Take 1 tablet by mouth daily.    clopidogrel (PLAVIX) 75 MG tablet Take 75 mg by mouth every evening.     docusate sodium (COLACE) 100 MG capsule Take 100 mg by mouth daily as needed for mild constipation.    furosemide (LASIX) 80 MG tablet TAKE 1 TABLET (80 MG TOTAL) BY MOUTH 2 (TWO) TIMES DAILY. Qty: 60 tablet, Refills: 11    gabapentin (NEURONTIN) 300 MG capsule Take 300 mg by mouth 3 (three) times daily.    hydrALAZINE (APRESOLINE) 50 MG tablet Take 1 tablet (50 mg total) by mouth 2 (two) times daily. Qty: 30 tablet, Refills: 1   Associated Diagnoses: Hypertensive heart disease with heart failure (HCC)    HYDROcodone-acetaminophen (NORCO/VICODIN) 5-325 MG tablet Take 1-2 tablets by mouth every 6 (six) hours as needed. Qty: 10 tablet, Refills: 0    insulin glargine (LANTUS) 100 UNIT/ML injection Inject 0.22 mLs (22 Units total) into the skin daily. Qty: 10 mL, Refills: 11    isosorbide mononitrate (IMDUR) 60 MG 24  hr tablet Take 60 mg by mouth daily.    nitroGLYCERIN (NITROSTAT) 0.4 MG SL tablet Place 0.4 mg under the tongue every 5 (five) minutes as needed for chest pain.     pravastatin (PRAVACHOL) 40 MG tablet Take 40 mg by mouth every evening.     RENVELA 800 MG tablet Take 800 mg by mouth 3 (three) times daily with meals.        Allergies  Allergen Reactions  . Aspirin Nausea And Vomiting    325 mg (adult strength) [name]    Follow-up Information    [facility] .   Why:  HHRN/aide arranged- they will call you to set up home visits Contact information: [address] [place] [address] [phone]        Georgianne Fick, MD. Schedule an appointment as soon as possible for a visit in 2 week(s).   Specialty:  Internal  Medicine Contact information: [address] [place] [address] [place] [address] [phone]            The results of significant diagnostics from this hospitalization (including imaging, microbiology, ancillary and laboratory) are listed below for reference.    Significant Diagnostic Studies: Dg Esophagus  Result Date: 11/09/2015 CLINICAL DATA:  Food getting stuck in the esophagus, nausea and vomiting. EXAM: ESOPHOGRAM/BARIUM SWALLOW TECHNIQUE: Single contrast examination was performed using  thin barium. FLUOROSCOPY TIME:  Radiation Exposure Index (as provided by the fluoroscopic device): 1 minutes 5 seconds. If the device does not provide the exposure index: Fluoroscopy Time:  dictate in minutes and seconds Number of Acquired Images:  1 COMPARISON:  None. FINDINGS: A limited examination was performed in the recumbent LPO position. There are tertiary contractions during swallowing. No definite esophageal fold thickening, stricture or obstruction. A small diverticulum is seen off the distal esophagus. A tablet was not administered due to patient condition. IMPRESSION: 1. Esophageal dysmotility. 2. Distal esophageal diverticulum. Electronically Signed   By: Leanna Battles M.D.   On: 11/09/2015 10:05   Dg Abd Portable 1v  Result Date: 11/08/2015 CLINICAL DATA:  Nausea and vomiting. EXAM: PORTABLE ABDOMEN - 1 VIEW COMPARISON:  January 13, 2013 FINDINGS: No bowel obstruction identified. No free air, portal venous gas, or pneumatosis. No acute abnormalities identified. IMPRESSION: No acute abnormalities identified. Electronically Signed   By: Gerome Sam III M.D   On: 11/08/2015 17:06    Microbiology: Recent Results (from the past 240 hour(s))  Urine culture     Status: None   Collection Time: 11/08/15  2:51 PM  Result Value Ref Range Status   Specimen Description URINE, RANDOM  Final   Special Requests NONE  Final   Culture NO GROWTH  Final   Report Status 11/09/2015 FINAL   Final     Labs: Basic Metabolic Panel:  Recent Labs Lab 11/09/15 0546 11/10/15 0240 11/11/15 0304 11/12/15 0742 11/13/15 0245  NA 143 144 141 139 136  K 4.4 4.4 4.7 4.3 4.6  CL 115* 119* 117* 113* 107  CO2 17* 14* 15* 18* 20*  GLUCOSE 174* 145* 168* 158* 145*  BUN 26* 22* 17 18 17   CREATININE 1.92* 1.91* 1.70* 1.92* 1.94*  CALCIUM 8.8* 8.8* 8.3* 8.9 8.9   Liver Function Tests:  Recent Labs Lab 11/08/15 1409  AST 49*  ALT 35  ALKPHOS 104  BILITOT 0.5  PROT 7.7  ALBUMIN 3.4*   No results for input(s): LIPASE, AMYLASE in  the last 168 hours. No results for input(s): AMMONIA in the last 168 hours. CBC:  Recent Labs Lab 11/08/15 1610 11/09/15 0546 11/11/15 0304 11/13/15 0245  WBC 7.5 7.7 8.1 7.0  NEUTROABS 5.0  --   --   --   HGB 10.5* 10.6* 10.3* 10.3*  HCT 33.9* 34.8* 33.5* 33.2*  MCV 93.1 94.3 93.8 91.7  PLT 321 335 292 296   Cardiac Enzymes:  Recent Labs Lab 11/08/15 2032  CKTOTAL 9*   BNP: BNP (last 3 results) No results for input(s): BNP in the last 8760 hours.  ProBNP (last 3 results) No results for input(s): PROBNP in the last 8760 hours.  CBG:  Recent Labs Lab 11/12/15 1111 11/12/15 1657 11/12/15 2018 11/13/15 0620 11/13/15 1052  GLUCAP 296* 120* 149* 129* 216*       Signed:  Nykayla Marcelli MD.  [facility] 11/13/2015, 12:12 PM

## 2015-11-19 ENCOUNTER — Ambulatory Visit: Payer: Medicare Other

## 2015-11-19 NOTE — Progress Notes (Deleted)
Patient ID: Shannon SirenRoberta G Senna                 DOB: 08/24/1923                      MRN: [id number]     HPI: [name] is a 82 y.o. female patient of Dr. Rennis GoldenHilty with PMH below who presents today for hypertension evaluation.   Cardiac Hx: CAD, cardiomyopathy, DM, PVD, CHF, HTN,CKD stage 4 (not on dialysis)   Current HTN meds:  Amlodipine 10mg  daily Hydralazine 50mg  BID Furosemide 80mg  once daily Isosorbide mononitrate 60mg  daily  Previously tried: carvedilol - low HR  BP goal: <150/90  Family History: no cardiac history that she is aware of  Social History: Quit smoking in 1988. Denies alcohol.   Diet: Eats most of her meals at home. She endorses occasionally eating fried foods. Her son does not cook with salt and she denies adding salt to her meals. She rarely drinks coffee or tea. She does however drink at least one can of coke/pepsi per day which is down from 3-4 cans.   Exercise: Limited. She was doing home OT/PT.  Home BP readings:   Wt Readings from Last 3 Encounters:  11/12/15 121 lb 8 oz (55.1 kg)  10/02/15 125 lb (56.7 kg)  09/02/15 130 lb (59 kg)   BP Readings from Last 3 Encounters:  11/13/15 (!) 181/73  10/22/15 (!) 158/92  10/07/15 (!) 184/87   Pulse Readings from Last 3 Encounters:  11/13/15 95  10/22/15 95  10/07/15 76    Renal function: Estimated Creatinine Clearance: 15 mL/min (by C-G formula based on SCr of 1.94 mg/dL).  Past Medical History:  Diagnosis Date  . Abnormal nuclear stress test 06/01/09   Demonstrated a new area of infarct scar, peri-infarct ischemia seen in the inferolateral territory. EF eas normal at 70% with mild hypocontractility at the apex, distal inferolateral wall.  . Anemia   . Arthritis   . Cardiomyopathy, idiopathic (HCC) 02/08/2011  . Chronic diastolic CHF (congestive heart failure) (HCC)    Takes Lasix  . Chronic kidney disease (CKD), stage IV (severe) (HCC)   . Coronary artery disease    a. Cath 09/2010 - med  rx.  . Diabetes mellitus    type 2 NIDDM  . Dyslipidemia   . GERD (gastroesophageal reflux disease)   . Gout    takes allopurinol  . H/O echocardiogram 09/06/11   Indication- nonIschemic Cardiomyopathy. EF = now greater than 55% with no regional wall motion abnormalities. Tthere is mild to moderate trisuspid regurgitayion and mild pulmonary hypertension with an RVSP of 35 mmHg as well as stage 1 diastolic dysfunction and mild to moderate LVH.  Marland Kitchen. Hx of transient ischemic attack (TIA)   . Hypertension   . Hypothyroidism    (SEVERE) Takes Levothryroxine  . Irregular heartbeat   . Memory loss   . Myocardial infarct (HCC)    x 3 unsure of years  . Nonischemic cardiomyopathy (HCC)    EF now is 55%, reduced due to myxedema, which is improved.  . Peripheral neuropathy (HCC)   . Peripheral vascular disease (HCC)    a. s/p L BKA.  . Shingles   . Stroke (HCC)   . TIA (transient ischemic attack)   . Ulcer     Current Outpatient Prescriptions on File Prior to Visit  Medication Sig Dispense Refill  . acetaminophen (TYLENOL) 325 MG tablet Take 650 mg by mouth every  6 (six) hours as needed for mild pain.    Marland Kitchen albuterol-ipratropium (COMBIVENT) 18-103 MCG/ACT inhaler Inhale 2 puffs into the lungs at bedtime as needed for wheezing or shortness of breath.     . allopurinol (ZYLOPRIM) 100 MG tablet Take 200 mg by mouth daily.     Marland Kitchen amLODipine (NORVASC) 10 MG tablet Take 1 tablet (10 mg total) by mouth daily. 90 tablet 1  . aspirin EC 81 MG tablet Take 81 mg by mouth every morning.     . calcitRIOL (ROCALTROL) 0.25 MCG capsule Take 0.25 mcg by mouth every other day.     . Calcium Carbonate-Vitamin D (CALCIUM-VITAMIN D) 500-200 MG-UNIT tablet Take 1 tablet by mouth daily.    . clopidogrel (PLAVIX) 75 MG tablet Take 75 mg by mouth every evening.     . docusate sodium (COLACE) 100 MG capsule Take 100 mg by mouth daily as needed for mild constipation.    . famotidine (PEPCID) 20 MG tablet Take 1 tablet  (20 mg total) by mouth at bedtime. 30 tablet 3  . feeding supplement (BOOST / RESOURCE BREEZE) LIQD Take 1 Container by mouth 3 (three) times daily between meals.  0  . furosemide (LASIX) 80 MG tablet TAKE 1 TABLET (80 MG TOTAL) BY MOUTH 2 (TWO) TIMES DAILY. 60 tablet 11  . gabapentin (NEURONTIN) 300 MG capsule Take 300 mg by mouth 3 (three) times daily.    . hydrALAZINE (APRESOLINE) 50 MG tablet Take 1 tablet (50 mg total) by mouth 2 (two) times daily. 30 tablet 1  . HYDROcodone-acetaminophen (NORCO/VICODIN) 5-325 MG tablet Take 1-2 tablets by mouth every 6 (six) hours as needed. (Patient taking differently: Take 1-2 tablets by mouth every 6 (six) hours as needed for moderate pain. ) 10 tablet 0  . insulin glargine (LANTUS) 100 UNIT/ML injection Inject 0.22 mLs (22 Units total) into the skin daily. (Patient taking differently: Inject 24 Units into the skin every morning. ) 10 mL 11  . isosorbide mononitrate (IMDUR) 60 MG 24 hr tablet Take 60 mg by mouth daily.    Marland Kitchen levothyroxine (SYNTHROID, LEVOTHROID) 100 MCG tablet Take 1 tablet (100 mcg total) by mouth daily before breakfast. 30 tablet 3  . metoCLOPramide (REGLAN) 5 MG tablet Take 1 tablet (5 mg total) by mouth 4 (four) times daily -  before meals and at bedtime. 120 tablet 3  . nitroGLYCERIN (NITROSTAT) 0.4 MG SL tablet Place 0.4 mg under the tongue every 5 (five) minutes as needed for chest pain.     . pravastatin (PRAVACHOL) 40 MG tablet Take 40 mg by mouth every evening.     Marland Kitchen RENVELA 800 MG tablet Take 800 mg by mouth 3 (three) times daily with meals.     . sodium bicarbonate 650 MG tablet Take 1 tablet (650 mg total) by mouth daily. 30 tablet 0   No current facility-administered medications on file prior to visit.     Allergies  Allergen Reactions  . Aspirin Nausea And Vomiting    325 mg (adult strength) [name]     There were no vitals taken for this  visit.   Assessment/Plan: Hypertension:    Thank you, Freddie Apley. Cleatis Polka, PharmD  [facility]  11/19/2015 11:43 AM

## 2015-11-25 DIAGNOSIS — R1310 Dysphagia, unspecified: Secondary | ICD-10-CM

## 2015-12-28 ENCOUNTER — Emergency Department (HOSPITAL_COMMUNITY): Payer: Medicare Other

## 2015-12-28 ENCOUNTER — Encounter (HOSPITAL_COMMUNITY): Payer: Self-pay | Admitting: Emergency Medicine

## 2015-12-28 ENCOUNTER — Inpatient Hospital Stay (HOSPITAL_COMMUNITY)
Admission: EM | Admit: 2015-12-28 | Discharge: 2016-01-05 | DRG: 064 | Disposition: A | Discharge status: Skilled Nursing Facility | Payer: Medicare Other | Attending: Internal Medicine | Admitting: Internal Medicine

## 2015-12-28 DIAGNOSIS — R1310 Dysphagia, unspecified: Secondary | ICD-10-CM | POA: Diagnosis present

## 2015-12-28 DIAGNOSIS — Z8249 Family history of ischemic heart disease and other diseases of the circulatory system: Secondary | ICD-10-CM

## 2015-12-28 DIAGNOSIS — Z515 Encounter for palliative care: Secondary | ICD-10-CM

## 2015-12-28 DIAGNOSIS — Z89512 Acquired absence of left leg below knee: Secondary | ICD-10-CM

## 2015-12-28 DIAGNOSIS — E039 Hypothyroidism, unspecified: Secondary | ICD-10-CM | POA: Diagnosis present

## 2015-12-28 DIAGNOSIS — R4182 Altered mental status, unspecified: Secondary | ICD-10-CM | POA: Diagnosis present

## 2015-12-28 DIAGNOSIS — E876 Hypokalemia: Secondary | ICD-10-CM | POA: Diagnosis present

## 2015-12-28 DIAGNOSIS — I130 Hypertensive heart and chronic kidney disease with heart failure and stage 1 through stage 4 chronic kidney disease, or unspecified chronic kidney disease: Secondary | ICD-10-CM | POA: Diagnosis present

## 2015-12-28 DIAGNOSIS — E1151 Type 2 diabetes mellitus with diabetic peripheral angiopathy without gangrene: Secondary | ICD-10-CM | POA: Diagnosis present

## 2015-12-28 DIAGNOSIS — R29810 Facial weakness: Secondary | ICD-10-CM | POA: Diagnosis present

## 2015-12-28 DIAGNOSIS — E1121 Type 2 diabetes mellitus with diabetic nephropathy: Secondary | ICD-10-CM | POA: Diagnosis present

## 2015-12-28 DIAGNOSIS — R531 Weakness: Secondary | ICD-10-CM

## 2015-12-28 DIAGNOSIS — I2510 Atherosclerotic heart disease of native coronary artery without angina pectoris: Secondary | ICD-10-CM | POA: Diagnosis present

## 2015-12-28 DIAGNOSIS — R414 Neurologic neglect syndrome: Secondary | ICD-10-CM | POA: Diagnosis present

## 2015-12-28 DIAGNOSIS — I639 Cerebral infarction, unspecified: Secondary | ICD-10-CM | POA: Diagnosis not present

## 2015-12-28 DIAGNOSIS — I272 Pulmonary hypertension, unspecified: Secondary | ICD-10-CM | POA: Diagnosis present

## 2015-12-28 DIAGNOSIS — Z833 Family history of diabetes mellitus: Secondary | ICD-10-CM

## 2015-12-28 DIAGNOSIS — I63312 Cerebral infarction due to thrombosis of left middle cerebral artery: Secondary | ICD-10-CM | POA: Diagnosis not present

## 2015-12-28 DIAGNOSIS — I4510 Unspecified right bundle-branch block: Secondary | ICD-10-CM | POA: Diagnosis present

## 2015-12-28 DIAGNOSIS — F0390 Unspecified dementia without behavioral disturbance: Secondary | ICD-10-CM | POA: Diagnosis present

## 2015-12-28 DIAGNOSIS — I63512 Cerebral infarction due to unspecified occlusion or stenosis of left middle cerebral artery: Secondary | ICD-10-CM

## 2015-12-28 DIAGNOSIS — I429 Cardiomyopathy, unspecified: Secondary | ICD-10-CM | POA: Diagnosis present

## 2015-12-28 DIAGNOSIS — K219 Gastro-esophageal reflux disease without esophagitis: Secondary | ICD-10-CM | POA: Diagnosis present

## 2015-12-28 DIAGNOSIS — Z79899 Other long term (current) drug therapy: Secondary | ICD-10-CM

## 2015-12-28 DIAGNOSIS — R29708 NIHSS score 8: Secondary | ICD-10-CM | POA: Diagnosis present

## 2015-12-28 DIAGNOSIS — N184 Chronic kidney disease, stage 4 (severe): Secondary | ICD-10-CM | POA: Diagnosis present

## 2015-12-28 DIAGNOSIS — M109 Gout, unspecified: Secondary | ICD-10-CM | POA: Diagnosis present

## 2015-12-28 DIAGNOSIS — R062 Wheezing: Secondary | ICD-10-CM

## 2015-12-28 DIAGNOSIS — G8191 Hemiplegia, unspecified affecting right dominant side: Secondary | ICD-10-CM | POA: Diagnosis present

## 2015-12-28 DIAGNOSIS — I69820 Aphasia following other cerebrovascular disease: Secondary | ICD-10-CM

## 2015-12-28 DIAGNOSIS — I6330 Cerebral infarction due to thrombosis of unspecified cerebral artery: Secondary | ICD-10-CM | POA: Insufficient documentation

## 2015-12-28 DIAGNOSIS — I6523 Occlusion and stenosis of bilateral carotid arteries: Secondary | ICD-10-CM | POA: Diagnosis present

## 2015-12-28 DIAGNOSIS — Z87891 Personal history of nicotine dependence: Secondary | ICD-10-CM

## 2015-12-28 DIAGNOSIS — E1122 Type 2 diabetes mellitus with diabetic chronic kidney disease: Secondary | ICD-10-CM | POA: Diagnosis present

## 2015-12-28 DIAGNOSIS — E785 Hyperlipidemia, unspecified: Secondary | ICD-10-CM | POA: Diagnosis present

## 2015-12-28 DIAGNOSIS — R4701 Aphasia: Secondary | ICD-10-CM

## 2015-12-28 DIAGNOSIS — G9349 Other encephalopathy: Secondary | ICD-10-CM | POA: Diagnosis present

## 2015-12-28 DIAGNOSIS — Z8673 Personal history of transient ischemic attack (TIA), and cerebral infarction without residual deficits: Secondary | ICD-10-CM

## 2015-12-28 DIAGNOSIS — Z794 Long term (current) use of insulin: Secondary | ICD-10-CM

## 2015-12-28 DIAGNOSIS — Z7189 Other specified counseling: Secondary | ICD-10-CM

## 2015-12-28 DIAGNOSIS — Z7982 Long term (current) use of aspirin: Secondary | ICD-10-CM

## 2015-12-28 DIAGNOSIS — E038 Other specified hypothyroidism: Secondary | ICD-10-CM | POA: Diagnosis present

## 2015-12-28 DIAGNOSIS — I5032 Chronic diastolic (congestive) heart failure: Secondary | ICD-10-CM | POA: Diagnosis present

## 2015-12-28 LAB — COMPREHENSIVE METABOLIC PANEL
ALK PHOS: 66 U/L (ref 38–126)
ALT: 19 U/L (ref 14–54)
AST: 34 U/L (ref 15–41)
Albumin: 3.3 g/dL — ABNORMAL LOW (ref 3.5–5.0)
Anion gap: 14 (ref 5–15)
BILIRUBIN TOTAL: 0.4 mg/dL (ref 0.3–1.2)
BUN: 11 mg/dL (ref 6–20)
CO2: 22 mmol/L (ref 22–32)
CREATININE: 2.12 mg/dL — AB (ref 0.44–1.00)
Calcium: 8.9 mg/dL (ref 8.9–10.3)
Chloride: 103 mmol/L (ref 101–111)
GFR calc Af Amer: 22 mL/min — ABNORMAL LOW (ref >60)
GFR, EST NON AFRICAN AMERICAN: 19 mL/min — AB (ref >60)
Glucose, Bld: 161 mg/dL — ABNORMAL HIGH (ref 65–99)
Potassium: 3.4 mmol/L — ABNORMAL LOW (ref 3.5–5.1)
Sodium: 139 mmol/L (ref 135–145)
TOTAL PROTEIN: 6.3 g/dL — AB (ref 6.5–8.1)

## 2015-12-28 LAB — I-STAT CHEM 8, ED
BUN: 16 mg/dL (ref 6–20)
CREATININE: 2 mg/dL — AB (ref 0.44–1.00)
Calcium, Ion: 0.95 mmol/L — ABNORMAL LOW (ref 1.15–1.40)
Chloride: 104 mmol/L (ref 101–111)
Glucose, Bld: 156 mg/dL — ABNORMAL HIGH (ref 65–99)
HEMATOCRIT: 36 % (ref 36.0–46.0)
Hemoglobin: 12.2 g/dL (ref 12.0–15.0)
Potassium: 3.4 mmol/L — ABNORMAL LOW (ref 3.5–5.1)
Sodium: 139 mmol/L (ref 135–145)
TCO2: 25 mmol/L (ref 0–100)

## 2015-12-28 LAB — CBC
HEMATOCRIT: 35.1 % — AB (ref 36.0–46.0)
HEMOGLOBIN: 10.8 g/dL — AB (ref 12.0–15.0)
MCH: 27.8 pg (ref 26.0–34.0)
MCHC: 30.8 g/dL (ref 30.0–36.0)
MCV: 90.2 fL (ref 78.0–100.0)
Platelets: 299 10*3/uL (ref 150–400)
RBC: 3.89 MIL/uL (ref 3.87–5.11)
RDW: 16.5 % — ABNORMAL HIGH (ref 11.5–15.5)
WBC: 6.4 10*3/uL (ref 4.0–10.5)

## 2015-12-28 LAB — DIFFERENTIAL
BASOS ABS: 0.1 10*3/uL (ref 0.0–0.1)
Basophils Relative: 1 %
Eosinophils Absolute: 0.2 10*3/uL (ref 0.0–0.7)
Eosinophils Relative: 4 %
LYMPHS ABS: 2.2 10*3/uL (ref 0.7–4.0)
LYMPHS PCT: 35 %
MONOS PCT: 9 %
Monocytes Absolute: 0.6 10*3/uL (ref 0.1–1.0)
NEUTROS ABS: 3.3 10*3/uL (ref 1.7–7.7)
Neutrophils Relative %: 51 %

## 2015-12-28 LAB — CBG MONITORING, ED: Glucose-Capillary: 153 mg/dL — ABNORMAL HIGH (ref 65–99)

## 2015-12-28 LAB — I-STAT TROPONIN, ED: TROPONIN I, POC: 0.03 ng/mL (ref 0.00–0.08)

## 2015-12-28 LAB — APTT: APTT: 34 s (ref 24–36)

## 2015-12-28 LAB — PROTIME-INR
INR: 1.14
Prothrombin Time: 14.7 seconds (ref 11.4–15.2)

## 2015-12-28 NOTE — Progress Notes (Signed)
Code stroke called on 82 y.o LSN per family at 2100. Per family via EMS Pt with acute decreased responsiveness and weakness on right side. Per chart notes Pt pertinent history includes CHF, CKD, DM, TIA, Stroke, HTN, L BKA.  Unsure of mental status baseline.  Pt taken to CT scan STAT and labs drawn. CT reviewed per  Dr. Roxy Manns as  negative for acute abnormality . Upon assessment Pt able to move bilateral arms equally, right sided neglect, not very cooperative with exam.  NIHSS scored 8, see neuro flow sheet for specifics. Not a TPA candidate at this time due to mild deficits, encephalopathy, and unsure of Patients baseline mental status. For MRI and admit tonight.

## 2015-12-28 NOTE — ED Triage Notes (Signed)
Patient arrives to ED via [facility]. EMS reports: Patient from home. Family reports approx 2100 that patient became less responsive. Reports that she is typically conversational, but would not follow commands or answer questions for EMS.  Patient has 20 gauge in L hand. Also, has scars for 2 fistulas - but patient is not a dialysis patient.

## 2015-12-28 NOTE — ED Notes (Signed)
Neurologist at bedside at this time.

## 2015-12-28 NOTE — Consult Note (Signed)
Neurology Consult Note  Reason for Consultation: CODE STROKE  Requesting provider: Frederick Peers, MD  CC: Patient denies any complaint but appears encephalopathic  HPI: This is a 82 year old woman who is brought into the emergency department by EMS because of "acute decreased responsiveness and weakness on her right side." Code stroke was activated as a result. I evaluated the patient in the emergency department. She appeared uncomfortable, rolling from side to side on the gurney and at times holding her head with her hand. She would attend to me and would answer simple questions with yes, no, and I don't know. She would attempt to say other things but her speech is slurred and difficult to understand. She is moving all extremities spontaneously with what appears to be equal strength. There is some question of possible right-sided neglect and possible right field cut. NIH stroke scale score was 8. She was taken for an emergent CT scan of the head which did not show any obvious acute abnormality. Over the course of her time in the emergency department, she began to attend to her right side better. She also began to speak more and although her speech remains slurred it became more intelligible. It was unclear what her precise functional baseline is, as on reviewing her chart she has history of memory loss and failure to thrive. Given that her exam was suggestive of encephalopathy with unclear baseline, the decision was made not to proceed with thrombolytic therapy.  Her son arrived in the emergency department later. When I spoke with him, he states that she was normal at about 9:00 tonight when she suddenly seemed to become more confused and unable to remember things. He says that he has had 2 strokes and her symptoms are similar to those that he experienced with his strokes. This is what prompted him to call 911. When I asked about her baseline, he states that she is "on point," and that she is normally  able to speak and dresses herself.  Last known well: 2100 NIH stroke score: 8 TPA given?: No, uncertainty regarding diagnosis, exam inconsistent  PMH:  Past Medical History:  Diagnosis Date  . Abnormal nuclear stress test 06/01/09   Demonstrated a new area of infarct scar, peri-infarct ischemia seen in the inferolateral territory. EF eas normal at 70% with mild hypocontractility at the apex, distal inferolateral wall.  . Anemia   . Arthritis   . Cardiomyopathy, idiopathic (HCC) 02/08/2011  . Chronic diastolic CHF (congestive heart failure) (HCC)    Takes Lasix  . Chronic kidney disease (CKD), stage IV (severe) (HCC)   . Coronary artery disease    a. Cath 09/2010 - med rx.  . Diabetes mellitus    type 2 NIDDM  . Dyslipidemia   . GERD (gastroesophageal reflux disease)   . Gout    takes allopurinol  . H/O echocardiogram 09/06/11   Indication- nonIschemic Cardiomyopathy. EF = now greater than 55% with no regional wall motion abnormalities. Tthere is mild to moderate trisuspid regurgitayion and mild pulmonary hypertension with an RVSP of 35 mmHg as well as stage 1 diastolic dysfunction and mild to moderate LVH.  Marland Kitchen Hx of transient ischemic attack (TIA)   . Hypertension   . Hypothyroidism    (SEVERE) Takes Levothryroxine  . Irregular heartbeat   . Memory loss   . Myocardial infarct    x 3 unsure of years  . Nonischemic cardiomyopathy (HCC)    EF now is 55%, reduced due to myxedema, which is improved.  Marland Kitchen  Peripheral neuropathy (HCC)   . Peripheral vascular disease (HCC)    a. s/p L BKA.  . Shingles   . Stroke (HCC)   . TIA (transient ischemic attack)   . Ulcer (HCC)     PSH:  Past Surgical History:  Procedure Laterality Date  . AMPUTATION  07/05/2011   Procedure: AMPUTATION BELOW KNEE;  Surgeon: Chuck Hint, MD;  Location[facility] OR;  Service: Vascular;  Laterality: Left;  . ANGIOPLASTY  1988  . AV FISTULA PLACEMENT    . AV FISTULA PLACEMENT Right 12/24/2013   Procedure:  INSERTION OF ARTERIOVENOUS (AV) GORE-TEX GRAFT ARM;  Surgeon: Chuck Hint, MD;  Location: [facility] OR;  Service: Vascular;  Laterality: Right;  . BACK SURGERY     [facility]  . CARDIAC CATHETERIZATION  09/28/07   Demonstrated multiple sequential lesions around 40 to 30% in the RCA territory.  . Duplex doppler  05/10/11   LE arterial dopplers demonstrate bilaterally reduced ABIs of 0.91 on right & 0.56 on left. She does report some decreased pain on the left, & there's moderate mixed-density plaque in the right SFA w/50 to 69% reduction. There's a 69% reduction in the left SFA & does appear to be occlusive disease of left posterior tibial artery. Right posterior dorsalis pedis artery demonstrates occlusive disease  . ESOPHAGOGASTRODUODENOSCOPY N/A 04/15/2014   Procedure: ESOPHAGOGASTRODUODENOSCOPY (EGD);  Surgeon: Rachael Fee, MD;  Location: [facility] ENDOSCOPY;  Service: Endoscopy;  Laterality: N/A;  . EYE SURGERY     Left eye surgery; cataract removal  . INSERTION OF DIALYSIS CATHETER N/A 01/06/2013   Procedure: INSERTION OF DIALYSIS CATHETER;  Surgeon: Larina Earthly, MD;  Location: [facility] OR;  Service: Vascular;  Laterality: N/A;  . left bka  07/05/2011  . left lower extremity venous duplex Left 06/27/11   Summary: No evidence of DVT involving the left lower extremity and right common femoral vein.   Marland Kitchen LIGATION ARTERIOVENOUS GORTEX GRAFT Right 03/25/2014   Procedure: LIGATION ARTERIOVENOUS GORTEX GRAFT;  Surgeon: Chuck Hint, MD;  Location: [facility] OR;  Service: Vascular;  Laterality: Right;  . LOWER EXTREMITY ANGIOGRAM N/A 10/31/2011   Procedure: LOWER EXTREMITY ANGIOGRAM;  Surgeon: Chuck Hint, MD;  Location: [facility] CATH LAB;  Service: Cardiovascular;  Laterality: N/A;  . Lower extremity arterial evaluation  06/27/11   SUMMARY: Right: ABI not ascertained due to false elevation in BP secondary to calcification (posterior tibial artery is non compressible). Left: ABI indicates moderate  reduction in arterial flow. Bilateral: Great toe PPG waveforms indicate adequate perfusion. Great toe pressures not obtained due to patient's movements secondary to pain.  Marland Kitchen SHUNTOGRAM N/A 03/17/2014   Procedure: Betsey Amen;  Surgeon: Fransisco Hertz, MD;  Location: [facility] CATH LAB;  Service: Cardiovascular;  Laterality: N/A;    Family history: Family History  Problem Relation Age of Onset  . Heart attack Daughter   . Heart disease Daughter     Before age 66  . Cancer Sister     STOMACH  . Diabetes Sister   . Cancer Brother     BONE  . Diabetes Brother   . Hyperlipidemia Daughter   . Hypertension Daughter   . Heart disease Daughter     before age 46  . Kidney disease Daughter   . Other Daughter     varicose veins  . Diabetes Daughter   . Heart disease Son     before age 62  . Hyperlipidemia Son   . Hypertension Son   .  Heart attack Son   . Anesthesia problems Neg Hx   . Hypotension Neg Hx   . Malignant hyperthermia Neg Hx   . Pseudochol deficiency Neg Hx     Social history:  Social History   Social History  . Marital status: Widowed    Spouse name: N/A  . Number of children: 4  . Years of education: N/A   Occupational History  . homemaker    Social History Main Topics  . Smoking status: Former Smoker    Packs/day: 0.50    Years: 40.00    Quit date: 07/05/1986  . Smokeless tobacco: Former Neurosurgeon     Comment: quit smoking 1988  . Alcohol use No  . Drug use: No  . Sexual activity: No   Other Topics Concern  . Not on file   Social History Narrative  . No narrative on file    Current outpatient meds: No outpatient prescriptions have been marked as taking for the 12/28/15 encounter [facility] Encounter).    Current inpatient meds:  No current facility-administered medications for this encounter.    Current Outpatient Prescriptions  Medication Sig Dispense Refill  . acetaminophen (TYLENOL) 325 MG tablet Take 650 mg by mouth every 6 (six) hours as needed for  mild pain.    Marland Kitchen albuterol-ipratropium (COMBIVENT) 18-103 MCG/ACT inhaler Inhale 2 puffs into the lungs at bedtime as needed for wheezing or shortness of breath.     . allopurinol (ZYLOPRIM) 100 MG tablet Take 200 mg by mouth daily.     Marland Kitchen amLODipine (NORVASC) 10 MG tablet Take 1 tablet (10 mg total) by mouth daily. 90 tablet 1  . aspirin EC 81 MG tablet Take 81 mg by mouth every morning.     . calcitRIOL (ROCALTROL) 0.25 MCG capsule Take 0.25 mcg by mouth every other day.     . Calcium Carbonate-Vitamin D (CALCIUM-VITAMIN D) 500-200 MG-UNIT tablet Take 1 tablet by mouth daily.    . clopidogrel (PLAVIX) 75 MG tablet Take 75 mg by mouth every evening.     . docusate sodium (COLACE) 100 MG capsule Take 100 mg by mouth daily as needed for mild constipation.    . famotidine (PEPCID) 20 MG tablet Take 1 tablet (20 mg total) by mouth at bedtime. 30 tablet 3  . feeding supplement (BOOST / RESOURCE BREEZE) LIQD Take 1 Container by mouth 3 (three) times daily between meals.  0  . furosemide (LASIX) 80 MG tablet TAKE 1 TABLET (80 MG TOTAL) BY MOUTH 2 (TWO) TIMES DAILY. 60 tablet 11  . gabapentin (NEURONTIN) 300 MG capsule Take 300 mg by mouth 3 (three) times daily.    . hydrALAZINE (APRESOLINE) 50 MG tablet Take 1 tablet (50 mg total) by mouth 2 (two) times daily. 30 tablet 1  . HYDROcodone-acetaminophen (NORCO/VICODIN) 5-325 MG tablet Take 1-2 tablets by mouth every 6 (six) hours as needed. (Patient taking differently: Take 1-2 tablets by mouth every 6 (six) hours as needed for moderate pain. ) 10 tablet 0  . insulin glargine (LANTUS) 100 UNIT/ML injection Inject 0.22 mLs (22 Units total) into the skin daily. (Patient taking differently: Inject 24 Units into the skin every morning. ) 10 mL 11  . isosorbide mononitrate (IMDUR) 60 MG 24 hr tablet Take 60 mg by mouth daily.    Marland Kitchen levothyroxine (SYNTHROID, LEVOTHROID) 100 MCG tablet Take 1 tablet (100 mcg total) by mouth daily before breakfast. 30 tablet 3  .  metoCLOPramide (REGLAN) 5 MG tablet Take 1 tablet (  5 mg total) by mouth 4 (four) times daily -  before meals and at bedtime. 120 tablet 3  . nitroGLYCERIN (NITROSTAT) 0.4 MG SL tablet Place 0.4 mg under the tongue every 5 (five) minutes as needed for chest pain.     . pravastatin (PRAVACHOL) 40 MG tablet Take 40 mg by mouth every evening.     Marland Kitchen RENVELA 800 MG tablet Take 800 mg by mouth 3 (three) times daily with meals.     . sodium bicarbonate 650 MG tablet Take 1 tablet (650 mg total) by mouth daily. 30 tablet 0    Allergies: Allergies  Allergen Reactions  . Aspirin Nausea And Vomiting    325 mg (adult strength) [name]     ROS: As per HPI. A full 14-point review of systems was limited by the patient's poor attention span.  PE:  BP 190/85   Pulse 85   Temp 98.2 F (36.8 C)   Resp 15   SpO2 98%   General:  Elderly Afro-American woman lying on the ED gurney. She appears to be mildly uncomfortable, rolling from side to side. She is alert and will focus on me at least briefly but sustained attention is limited.  She has significant dysarthria but is also edentulous. No definite aphasia. Follows commands  with mild delay  HEENT: Normocephalic. Neck supple without LAD. MMM, OP clear. She is edentulous. Sclerae anicteric. No conjunctival injection.  CV: Regular, no murmur. Carotid pulses full and symmetric, no bruits. Distal pulses 2+ and symmetric.  Lungs: CTAB on anterior auscultation .  Abdomen: Soft,  Obese, non-distended, non-tender. Bowel sounds present x4.  Extremities: No C/C/E. She is status-post a left AKA. Neuro:  CN: Pupils are equal and round. They are symmetrically reactive from 3-->2 mm. she initially would blink to threat from the left but not the right. Over the course of her ED stay, she was blinking to threat from both sides. EOMI  appear intact.  Corneals are present bilaterally. Face is symmetric with normal  mobility. Hearing appears intact to conversational voice.  Tongue protrudes to midline. Motor: Normal bulk. She has mitgehen paratonia with normal underlying tone. She is moving all extremities symmetrically with at least 4-/5 strength. No tremor or other abnormal movements. She is status-post left BKA. Sensation: She withdraws purposefully from minimal noxious stimulation 4.  DTRs: 2+, symmetric in the upper extremities.  The right knee jerk is trace. The right ankle jerk is absent. She is status-post left BKA. The right toe is downgoing.  Coordination: She did not perform finger-to-nose but has no overt dysmetria with observed spontaneous movements.   Labs:  Lab Results  Component Value Date   WBC 6.4 12/28/2015   HGB 12.2 12/28/2015   HCT 36.0 12/28/2015   PLT 299 12/28/2015   GLUCOSE 156 (H) 12/28/2015   CHOL 169 02/08/2011   TRIG 112 02/08/2011   HDL 71 02/08/2011   LDLCALC 76 02/08/2011   ALT 19 12/28/2015   AST 34 12/28/2015   NA 139 12/28/2015   K 3.4 (L) 12/28/2015   CL 104 12/28/2015   CREATININE 2.00 (H) 12/28/2015   BUN 16 12/28/2015   CO2 22 12/28/2015   TSH >90.000 (H) 11/08/2015   INR 1.14 12/28/2015   HGBA1C 8.4 (H) 11/08/2015    Imaging:  I have personally and independently reviewed the CT scan of the head without contrast from tonight. This shows moderate to severe  diffuse generalized atrophy. Extensive hypodensity is noted throughout the bihemispheric white matter, consistent with chronic small vessel ischemic changes. There is diffuse ventricular enlargement which may represent hydrocephalus ex vacuo.   Assessment and Plan:  1. Possible ischemic stroke: Her presentation certainly could represent an acute ischemic infarction. However, her exam is inconsistent and at times she seems more encephalopathic than focal so this diagnosis is not certain. Therefore, thrombolytic therapy was not administered. Recommend further evaluation with MRI scan brain and  urinalysis with culture. I would also recommend repeating thyroid studies given that TSH was greater than 90 when last checked on 11/08/15. Seizure is possible and would consider EEG as well. She has clear risk factors for cerebrovascular disease, including age, coronary artery disease, diabetes, hyperlipidemia, hypertension, history of TIA, and peripheral vascular disease. She was taking aspirin and Plavix at home, at least according to her most recent discharge summary, and these can be continued for secondary prevention. Continue pravastatin. If MRI scan reveals acute ischemic infarction, then can proceed with additional stroke evaluation at that time.   2. Acute encephalopathy: Her examination in the emergency department was largely nonfocal and more suggestive of encephalopathy. While this may be the product of an acute ischemic infarction (particularly in the parietal lobes or thalamus), must consider other causes as noted above. She has reduced cerebral reserve given extensive small vessel ischemic change and reports of memory loss and failure to thrive at baseline. I do not know what her precise cognitive or functional baselines are at this time. The radiologist interpretation of tonight's CT scan mentions possible transependymal flow of CSF with mild hydrocephalus, though this is difficult to ascertain given the extent of her small vessel disease and MRI would be a much better test to further characterize these findings. Regardless, the reduced cerebral reserve places her at increased risk of encephalopathy and delirium from all causes and may predict delayed recovery from same. Optimize metabolic status. As noted above, recommend MRI scan brain, urinalysis, and thyroid studies. Avoid CNS active medications, specifically benzodiazepines, opiates, and anything with strong anticholinergic properties.  This was discussed with the patient's son at the bedside. He is in agreement with the plan as noted. He  was given the opportunity to ask any questions and these were addressed to his satisfaction.  The case was also discussed with the ED attending, Dr. Clarene DukeLittle, at the time of my visit.

## 2015-12-28 NOTE — ED Notes (Signed)
CBG 153 

## 2015-12-28 NOTE — ED Notes (Signed)
Dr. Roxy Manns, neurologist, at bedside with patient/family.

## 2015-12-29 ENCOUNTER — Inpatient Hospital Stay (HOSPITAL_COMMUNITY): Payer: Medicare Other

## 2015-12-29 DIAGNOSIS — I63312 Cerebral infarction due to thrombosis of left middle cerebral artery: Secondary | ICD-10-CM | POA: Diagnosis present

## 2015-12-29 DIAGNOSIS — E1169 Type 2 diabetes mellitus with other specified complication: Secondary | ICD-10-CM | POA: Diagnosis not present

## 2015-12-29 DIAGNOSIS — Z515 Encounter for palliative care: Secondary | ICD-10-CM | POA: Diagnosis not present

## 2015-12-29 DIAGNOSIS — E1122 Type 2 diabetes mellitus with diabetic chronic kidney disease: Secondary | ICD-10-CM | POA: Diagnosis present

## 2015-12-29 DIAGNOSIS — M109 Gout, unspecified: Secondary | ICD-10-CM | POA: Diagnosis present

## 2015-12-29 DIAGNOSIS — N184 Chronic kidney disease, stage 4 (severe): Secondary | ICD-10-CM

## 2015-12-29 DIAGNOSIS — Z794 Long term (current) use of insulin: Secondary | ICD-10-CM | POA: Diagnosis not present

## 2015-12-29 DIAGNOSIS — I5032 Chronic diastolic (congestive) heart failure: Secondary | ICD-10-CM | POA: Diagnosis present

## 2015-12-29 DIAGNOSIS — E038 Other specified hypothyroidism: Secondary | ICD-10-CM | POA: Diagnosis not present

## 2015-12-29 DIAGNOSIS — K219 Gastro-esophageal reflux disease without esophagitis: Secondary | ICD-10-CM | POA: Diagnosis present

## 2015-12-29 DIAGNOSIS — I4510 Unspecified right bundle-branch block: Secondary | ICD-10-CM | POA: Diagnosis present

## 2015-12-29 DIAGNOSIS — R410 Disorientation, unspecified: Secondary | ICD-10-CM

## 2015-12-29 DIAGNOSIS — F0390 Unspecified dementia without behavioral disturbance: Secondary | ICD-10-CM | POA: Diagnosis present

## 2015-12-29 DIAGNOSIS — E876 Hypokalemia: Secondary | ICD-10-CM | POA: Diagnosis present

## 2015-12-29 DIAGNOSIS — E039 Hypothyroidism, unspecified: Secondary | ICD-10-CM

## 2015-12-29 DIAGNOSIS — I2510 Atherosclerotic heart disease of native coronary artery without angina pectoris: Secondary | ICD-10-CM | POA: Diagnosis not present

## 2015-12-29 DIAGNOSIS — E1151 Type 2 diabetes mellitus with diabetic peripheral angiopathy without gangrene: Secondary | ICD-10-CM | POA: Diagnosis present

## 2015-12-29 DIAGNOSIS — R29708 NIHSS score 8: Secondary | ICD-10-CM | POA: Diagnosis present

## 2015-12-29 DIAGNOSIS — E785 Hyperlipidemia, unspecified: Secondary | ICD-10-CM | POA: Diagnosis not present

## 2015-12-29 DIAGNOSIS — I10 Essential (primary) hypertension: Secondary | ICD-10-CM | POA: Diagnosis not present

## 2015-12-29 DIAGNOSIS — I69820 Aphasia following other cerebrovascular disease: Secondary | ICD-10-CM | POA: Diagnosis not present

## 2015-12-29 DIAGNOSIS — I6789 Other cerebrovascular disease: Secondary | ICD-10-CM | POA: Diagnosis not present

## 2015-12-29 DIAGNOSIS — M6289 Other specified disorders of muscle: Secondary | ICD-10-CM

## 2015-12-29 DIAGNOSIS — Z7189 Other specified counseling: Secondary | ICD-10-CM | POA: Diagnosis not present

## 2015-12-29 DIAGNOSIS — R4182 Altered mental status, unspecified: Secondary | ICD-10-CM | POA: Diagnosis not present

## 2015-12-29 DIAGNOSIS — R414 Neurologic neglect syndrome: Secondary | ICD-10-CM | POA: Diagnosis present

## 2015-12-29 DIAGNOSIS — R1310 Dysphagia, unspecified: Secondary | ICD-10-CM | POA: Diagnosis present

## 2015-12-29 DIAGNOSIS — R4701 Aphasia: Secondary | ICD-10-CM | POA: Diagnosis present

## 2015-12-29 DIAGNOSIS — G9349 Other encephalopathy: Secondary | ICD-10-CM | POA: Diagnosis present

## 2015-12-29 DIAGNOSIS — I6523 Occlusion and stenosis of bilateral carotid arteries: Secondary | ICD-10-CM | POA: Diagnosis present

## 2015-12-29 DIAGNOSIS — G8191 Hemiplegia, unspecified affecting right dominant side: Secondary | ICD-10-CM | POA: Diagnosis present

## 2015-12-29 DIAGNOSIS — I429 Cardiomyopathy, unspecified: Secondary | ICD-10-CM | POA: Diagnosis present

## 2015-12-29 DIAGNOSIS — E1121 Type 2 diabetes mellitus with diabetic nephropathy: Secondary | ICD-10-CM | POA: Diagnosis present

## 2015-12-29 DIAGNOSIS — I272 Pulmonary hypertension, unspecified: Secondary | ICD-10-CM | POA: Diagnosis present

## 2015-12-29 DIAGNOSIS — I130 Hypertensive heart and chronic kidney disease with heart failure and stage 1 through stage 4 chronic kidney disease, or unspecified chronic kidney disease: Secondary | ICD-10-CM | POA: Diagnosis present

## 2015-12-29 DIAGNOSIS — I63512 Cerebral infarction due to unspecified occlusion or stenosis of left middle cerebral artery: Secondary | ICD-10-CM | POA: Diagnosis not present

## 2015-12-29 DIAGNOSIS — R531 Weakness: Secondary | ICD-10-CM

## 2015-12-29 LAB — LIPID PANEL
CHOL/HDL RATIO: 4.2 ratio
CHOLESTEROL: 301 mg/dL — AB (ref 0–200)
HDL: 72 mg/dL (ref >40)
LDL Cholesterol: 188 mg/dL — ABNORMAL HIGH (ref 0–99)
Triglycerides: 205 mg/dL — ABNORMAL HIGH (ref <150)
VLDL: 41 mg/dL — ABNORMAL HIGH (ref 0–40)

## 2015-12-29 LAB — GLUCOSE, CAPILLARY
GLUCOSE-CAPILLARY: 126 mg/dL — AB (ref 65–99)
Glucose-Capillary: 169 mg/dL — ABNORMAL HIGH (ref 65–99)
Glucose-Capillary: 159 mg/dL — ABNORMAL HIGH (ref 65–99)
Glucose-Capillary: 128 mg/dL — ABNORMAL HIGH (ref 65–99)
Glucose-Capillary: 147 mg/dL — ABNORMAL HIGH (ref 65–99)

## 2015-12-29 LAB — T4, FREE: Free T4: 0.51 ng/dL — ABNORMAL LOW (ref 0.61–1.12)

## 2015-12-29 LAB — ETHANOL

## 2015-12-29 LAB — TSH: TSH: 121.241 u[IU]/mL — ABNORMAL HIGH (ref 0.350–4.500)

## 2015-12-29 MED ORDER — POTASSIUM CHLORIDE 10 MEQ/100ML IV SOLN
10.0000 meq | INTRAVENOUS | Status: AC
  Administered 2015-12-29 (×2): 10 meq via INTRAVENOUS
  Filled 2015-12-29 (×2): qty 100

## 2015-12-29 MED ORDER — INSULIN ASPART 100 UNIT/ML ~~LOC~~ SOLN
0.0000 [IU] | SUBCUTANEOUS | Status: DC
  Administered 2015-12-29 (×2): 1 [IU] via SUBCUTANEOUS
  Administered 2015-12-29 (×2): 2 [IU] via SUBCUTANEOUS
  Administered 2015-12-30 – 2016-01-02 (×10): 1 [IU] via SUBCUTANEOUS
  Administered 2016-01-03: 3 [IU] via SUBCUTANEOUS
  Administered 2016-01-03 – 2016-01-04 (×4): 1 [IU] via SUBCUTANEOUS
  Administered 2016-01-04: 3 [IU] via SUBCUTANEOUS
  Administered 2016-01-04 – 2016-01-05 (×4): 1 [IU] via SUBCUTANEOUS

## 2015-12-29 MED ORDER — LORAZEPAM 2 MG/ML IJ SOLN: 0.5000 mg | Freq: Once | INTRAMUSCULAR | Status: DC

## 2015-12-29 MED ORDER — ACETAMINOPHEN 650 MG RE SUPP
650.0000 mg | RECTAL | Status: DC | PRN
  Administered 2015-12-30 (×2): 650 mg via RECTAL
  Filled 2015-12-29 (×2): qty 1

## 2015-12-29 MED ORDER — FAMOTIDINE IN NACL 20-0.9 MG/50ML-% IV SOLN
20.0000 mg | Freq: Two times a day (BID) | INTRAVENOUS | Status: DC
  Administered 2015-12-29 – 2016-01-05 (×16): 20 mg via INTRAVENOUS
  Filled 2015-12-29 (×16): qty 50

## 2015-12-29 MED ORDER — LORAZEPAM 2 MG/ML IJ SOLN
INTRAMUSCULAR | Status: AC
  Filled 2015-12-29: qty 1

## 2015-12-29 MED ORDER — LEVOTHYROXINE SODIUM 100 MCG IV SOLR
50.0000 ug | Freq: Every day | INTRAVENOUS | Status: DC
  Administered 2015-12-29 – 2016-01-05 (×8): 50 ug via INTRAVENOUS
  Filled 2015-12-29 (×8): qty 5

## 2015-12-29 MED ORDER — ASPIRIN 325 MG PO TABS
325.0000 mg | ORAL_TABLET | Freq: Every day | ORAL | Status: DC
  Administered 2016-01-04 – 2016-01-05 (×2): 325 mg via ORAL
  Filled 2015-12-29 (×3): qty 1

## 2015-12-29 MED ORDER — ASPIRIN 300 MG RE SUPP
300.0000 mg | Freq: Every day | RECTAL | Status: DC
  Administered 2015-12-29 – 2016-01-03 (×6): 300 mg via RECTAL
  Filled 2015-12-29 (×6): qty 1

## 2015-12-29 MED ORDER — ACETAMINOPHEN 325 MG PO TABS: 650.0000 mg | ORAL_TABLET | ORAL | Status: DC | PRN

## 2015-12-29 MED ORDER — HYDRALAZINE HCL 20 MG/ML IJ SOLN
20.0000 mg | Freq: Four times a day (QID) | INTRAMUSCULAR | Status: DC | PRN
  Administered 2015-12-29 – 2015-12-31 (×4): 20 mg via INTRAVENOUS
  Filled 2015-12-29 (×4): qty 1

## 2015-12-29 NOTE — ED Notes (Signed)
Inpatient physician at bedside at this time. Patient to be transported after assessment completed by MD.

## 2015-12-29 NOTE — ED Notes (Signed)
Patient transported to X-ray 

## 2015-12-29 NOTE — Evaluation (Signed)
SLP Cancellation Note  Patient Details Name: [name] MRN: [id number] DOB: [birth year]   Cancelled treatment:       Reason Eval/Treat Not Completed: Fatigue/lethargy limiting ability to participate (pt not adequately alert for po or evaluation, sitting upright with PT on bed but nodding off easily and mildly drooling, family Mathis Fare and Meriam Sprague present and admit to baseline dysphagia *esophageal).  Advised to continue npo with oral care and will follow up next date.   RN requested to set up oral suction for use.  Thanks.    Donavan Burnet, MS [facility] SLP [phone]

## 2015-12-29 NOTE — Evaluation (Signed)
Physical Therapy Evaluation Patient Details Name: [name] MRN: [id number] DOB: [birth year] Today's Date: 12/29/2015   History of Present Illness  pt is a 82 y/o female with h/o TIA, PVD with L BKA, CKD4, HTN CAD, dHF and DM admitted to ED with acute mental status changes with slurred speech aphasia, right sided weakness and facial droop.  MRI pending, first attempt failed.  Clinical Impression  Pt admitted with/for stroke symptoms.  Have been unable to fully assess pt due to decreased level or arousal.  Pt currently limited functionally due to the problems listed. ( See problems list.)   Pt will benefit from PT to maximize function and safety in order to get ready for next venue listed below.     Follow Up Recommendations SNF (but son may want pt home since she is already care dependent)    Equipment Recommendations  None recommended by PT    Recommendations for Other Services       Precautions / Restrictions Precautions Precautions: Fall      Mobility  Bed Mobility Overal bed mobility: Needs Assistance Bed Mobility: Rolling;Sidelying to Sit;Sit to Sidelying Rolling: Total assist Sidelying to sit: Total assist     Sit to sidelying: Total assist General bed mobility comments: pt initially not responding.  After sitting EOB responded by leaning Left toward the pillow to lie down  Transfers                 General transfer comment: not tested due to level of arousal  Ambulation/Gait             General Gait Details: not applicable  Stairs            Wheelchair Mobility    Modified Rankin (Stroke Patients Only) Modified Rankin (Stroke Patients Only) Pre-Morbid Rankin Score: Severe disability Modified Rankin: Severe disability     Balance Overall balance assessment: Needs assistance Sitting-balance support: Feet unsupported;Single extremity supported Sitting balance-Leahy Scale: Poor Sitting balance - Comments: sat EOB x 10-15 min with  notable increase in arousal.  pt made attempts to maintain upright posture by pulling forward into sitting, but still was total assist.                                     Pertinent Vitals/Pain Pain Assessment: Faces Faces Pain Scale: No hurt    Home Living Family/patient expects to be discharged to:: Private residence Living Arrangements: Children (lives with son) Available Help at Discharge: Family;Available 24 hours/day Type of Home: House Home Access: Ramped entrance     Home Layout: One level Home Equipment: Walker - 2 wheels;Bedside commode;Tub bench;Hospital bed;Wheelchair - manual      Prior Function Level of Independence: Needs assistance   Gait / Transfers Assistance Needed: pt has had progressive decline to the point she is a max-total assist for pivot transfers only and OOB  ADL's / Homemaking Assistance Needed: son bathes and dresses her as well as all the housework        Hand Dominance        Extremity/Trunk Assessment   Upper Extremity Assessment: Defer to OT evaluation           Lower Extremity Assessment: RLE deficits/detail;LLE deficits/detail RLE Deficits / Details: PROM WFL, no voluntary/spontaneous movement noted LLE Deficits / Details: knee flexion contracture, no spontaneous movement noted during MMT>     Communication   Communication: No  difficulties  Cognition Arousal/Alertness: Lethargic Behavior During Therapy: Flat affect Overall Cognitive Status: Difficult to assess                      General Comments General comments (skin integrity, edema, etc.): pt still too lethagic to assess swallow per SLP    Exercises     Assessment/Plan    PT Assessment Patient needs continued PT services  PT Problem List Decreased strength;Decreased activity tolerance;Decreased balance;Decreased mobility;Decreased coordination          PT Treatment Interventions Functional mobility training;Therapeutic  activities;Therapeutic exercise;Balance training;Neuromuscular re-education;Patient/family education    PT Goals (Current goals can be found in the Care Plan section)  Acute Rehab PT Goals Patient Stated Goal: pt's son would like his Mom home. PT Goal Formulation: Patient unable to participate in goal setting Time For Goal Achievement: 01/12/16 Potential to Achieve Goals: Fair    Frequency Min 3X/week   Barriers to discharge        Co-evaluation               End of Session     Patient left: in bed;with call bell/phone within reach;with family/visitor present Nurse Communication: Mobility status         Time: [id number] PT Time Calculation (min) (ACUTE ONLY): 30 min   Charges:   PT Evaluation $PT Eval Moderate Complexity: 1 Procedure PT Treatments $Therapeutic Activity: 8-22 mins   PT G Codes:        Maclin Guerrette, Eliseo GumKenneth V 12/29/2015, 12:30 PM 12/29/2015  [place] BingKen Jamaine Quintin, PT [phone]  (pager)

## 2015-12-29 NOTE — Progress Notes (Signed)
MRI exam attempted as ordered. Patient unable to follow commands and is uncooperative. Patient was unable to hold still for exam. Physician did not want to sedate at this time. Will submit images obtained. Limited study due to motion.

## 2015-12-29 NOTE — Evaluation (Signed)
SLP Cancellation Note  Patient Details Name: [name] MRN: [id number] DOB: 07-10-1923   Cancelled treatment:       Reason Eval/Treat Not Completed: Patient at procedure or test/unavailable;Other (comment) (RN reports pt not alert this am, currently at MRI)   Mills Koller, MS [facility] SLP [phone]

## 2015-12-29 NOTE — Progress Notes (Signed)
PROGRESS NOTE    [name]  ZOX:[id number] DOB: [birth year] DOA: 12/28/2015 PCP: Georgianne Fick, MD   Brief Narrative:   [name] is a 82 y.o. woman with a history of prior TIA (family denies history of prior CVA), Left BKA, PVD, CKD IV (she is not a dialysis patient), HTN, HLD, CAD, chronic diastolic heart failure, and DM who presents to the ED after her son called 911 promptly after witnessing acute mental status changes in the patient including slurred speech, aphasia, decreased awareness, right sided weakness, and right sided facial droop. CODE STROKE was called upon arrival.  Neurology has seen the patient.  She was not felt to be a tPA candidate due to inconsistent physical exam findings and concerns for acute encephalopathy.  Head CT negative for acute process (though chronic changes were noted).  Hospitalist asked to admit. EKG shows NSR, RBBB.  Chest xray negative for acute process.  U/A pending.  She has had systolic blood pressures greater than 200.   Assessment & Plan:   Principal Problem:   Acute right-sided weakness Active Problems:   CAD - moderate at cath July 2012 (medical Rx)   Hypothyroid   Chronic renal insufficiency, stage IV (severe)- HD started 01/05/13   Diabetes mellitus type 2 with peripheral artery disease (HCC)   Altered mental state   Acute right sided weakness with facial droop and right sided neglect concerning for acute stroke --Admit to telemetry --Neurology has seen, recommendations appreciated --Will check MRI brain, U/A, TFTs, EEG  -- EEG performed -- MRI limited due to inability to hold still but showed: Acute posterior left MCA or MCA/PCA watershed infarct with no associated hemorrhage or mass effect. Progressed and widespread cerebral white matter signal abnormality since 2006, favor chronic small vessel disease related. Chronic left thalamic lacunar infarct. -- Echocardiogram and Carotid ultrasound ordered --PT/OT/Speech  evaluations --NPO until she passes a swallow evaluation (holding home medications) --Aspirin per rectum for now (holding plavix until she can swallow) --Resume statin when tolerating PO -- Patient very confused during MRI scanner   History of hypothyroidism --IV levothyroxine for now -- will need to increase levothyroxine dose as TSH has increased to 121 -- continue IV medication until patient able to tolerate PO -- levothyroxine 62. IV q24 can be considered but at this time I am not sure she did not have a response to levothyroxine given that her son reports she barely takes her medication  HTN --IV hydralazine prn until tolerating PO.  Will allow permissive hypertension to some degree while ruling out CVA.  Mild hypokalemia --IV KCl one time now   DVT prophylaxis: SCD to right leg Code Status: Full code Family Communication: patients son and daughter bedside- updated on assessment and plan Disposition Plan: Unclear at this time- pending work up   Consultants:   Neurology  PT/OT   Procedures:   EEG 10/10  Antimicrobials:   none    Subjective: Patient seen when she returned from MRI.  She was moving during MRI so scan was limited.  MRI did show CVA.  Discussed with patient's son and daughter patient's previous baseline including PO intake, activity level, and care of chronic medical conditions.  Patient, prior to last hospitalization in August, was mostly self sufficient, but since discharge in August has been requiring son's assistance in taking medication, dressing and encouraging PO intake.  Son reports patient barely takes medication.    Objective: Vitals:   12/29/15 0130 12/29/15 0330 12/29/15 0530 12/29/15 0730  BP: (!) 160/109 (!) 167/108 (!) 184/101 (!) 162/137  Pulse: 79 87 85 91  Resp: 18 18 18 20   Temp: 97.3 F (36.3 C)     TempSrc: Axillary     SpO2: 99% 98% 98% 98%    Intake/Output Summary (Last 24 hours) at 12/29/15 1040 Last data  filed at 12/29/15 0500  Gross per 24 hour  Intake              250 ml  Output                0 ml  Net              250 ml   There were no vitals filed for this visit.  Examination:  General exam: Appears calm and comfortable, eyes closed, moans to name Respiratory system: Clear to auscultation. Respiratory effort normal. Cardiovascular system: S1 & S2 heard, RRR. No JVD, murmurs, rubs, gallops or clicks. No pedal edema. Gastrointestinal system: Abdomen is nondistended, soft and nontender. No organomegaly or masses felt. Normal bowel sounds heard. Central nervous system: unable to assess as patient not participating in exam Extremities: unable to assess as patient not contributory Skin: No rashes, lesions or ulcers Psychiatry: unable to evaluate as patient not participating in exam.     Data Reviewed: I have personally reviewed following labs and imaging studies  CBC:  Recent Labs Lab 12/28/15 2226 12/28/15 2231  WBC 6.4  --   NEUTROABS 3.3  --   HGB 10.8* 12.2  HCT 35.1* 36.0  MCV 90.2  --   PLT 299  --    Basic Metabolic Panel:  Recent Labs Lab 12/28/15 2226 12/28/15 2231  NA 139 139  K 3.4* 3.4*  CL 103 104  CO2 22  --   GLUCOSE 161* 156*  BUN 11 16  CREATININE 2.12* 2.00*  CALCIUM 8.9  --    GFR: CrCl cannot be calculated (Unknown ideal weight.). Liver Function Tests:  Recent Labs Lab 12/28/15 2226  AST 34  ALT 19  ALKPHOS 66  BILITOT 0.4  PROT 6.3*  ALBUMIN 3.3*   No results for input(s): LIPASE, AMYLASE in the last 168 hours. No results for input(s): AMMONIA in the last 168 hours. Coagulation Profile:  Recent Labs Lab 12/28/15 2226  INR 1.14   Cardiac Enzymes: No results for input(s): CKTOTAL, CKMB, CKMBINDEX, TROPONINI in the last 168 hours. BNP (last 3 results) No results for input(s): PROBNP in the last 8760 hours. HbA1C: No results for input(s): HGBA1C in the last 72 hours. CBG:  Recent Labs Lab 12/28/15 2230 12/29/15 0351  12/29/15 0806  GLUCAP 153* 169* 126*   Lipid Profile:  Recent Labs  12/29/15 0305  CHOL 301*  HDL 72  LDLCALC 188*  TRIG 205*  CHOLHDL 4.2   Thyroid Function Tests:  Recent Labs  12/29/15 0306  TSH 121.241*  FREET4 0.51*   Anemia Panel: No results for input(s): VITAMINB12, FOLATE, FERRITIN, TIBC, IRON, RETICCTPCT in the last 72 hours. Sepsis Labs: No results for input(s): PROCALCITON, LATICACIDVEN in the last 168 hours.  No results found for this or any previous visit (from the past 240 hour(s)).       Radiology Studies: Dg Chest 2 View  Result Date: 12/29/2015 CLINICAL DATA:  82 y/o F; altered mental status and possible stroke. EXAM: CHEST  2 VIEW COMPARISON:  06/22/2014 chest radiograph. FINDINGS: Stable cardiomegaly given projection and technique. Aortic atherosclerosis with arch calcifications. Bibasilar reticular opacities may represent  scarring or atelectasis. No focal consolidation. No pneumothorax. No pleural effusion. No acute osseous abnormality is identified. IMPRESSION: No active cardiopulmonary disease. Electronically Signed   By: Mitzi Hansen M.D.   On: 12/29/2015 01:03   Ct Head Wo Contrast  Result Date: 12/28/2015 CLINICAL DATA:  Code stroke, with right-sided hemineglect and altered mental status. Initial encounter. EXAM: CT HEAD WITHOUT CONTRAST TECHNIQUE: Contiguous axial images were obtained from the base of the skull through the vertex without intravenous contrast. COMPARISON:  CT of the head performed 06/15/2013 FINDINGS: Brain: No evidence of acute infarction, hemorrhage, extra-axial collection or mass lesion/mass effect. There is diffuse prominence of the supratentorial ventricles, increased from the prior study, with suggestion of underlying transependymal resorption of CSF, concerning for mild hydrocephalus. Underlying mild cortical volume loss is noted. Mild cerebellar atrophy is noted. Scattered periventricular and subcortical white  matter change likely reflects small vessel ischemic microangiopathy. The brainstem and fourth ventricle are within normal limits. The basal ganglia are unremarkable in appearance. The cerebral hemispheres demonstrate grossly normal gray-white differentiation. No mass effect or midline shift is seen. Vascular: No hyperdense vessel or unexpected calcification. Skull: There is no evidence of fracture; visualized osseous structures are unremarkable in appearance. Sinuses/Orbits: The orbits are within normal limits. The paranasal sinuses and mastoid air cells are well-aerated. Other: No significant soft tissue abnormalities are seen. IMPRESSION: 1. No acute intracranial pathology seen on CT. 2. Increased diffuse prominence of the supratentorial ventricles, with suggestion of underlying transependymal resorption of CSF, concerning for mild hydrocephalus. 3. Underlying mild cortical volume loss and scattered small vessel ischemic microangiopathy noted. These results were called by telephone at the time of interpretation on 12/28/2015 at 10:46 pm to Dr. Roxy Manns, who verbally acknowledged these results. Electronically Signed   By: Roanna Raider M.D.   On: 12/28/2015 22:49        Scheduled Meds: . aspirin  300 mg Rectal Daily   Or  . aspirin  325 mg Oral Daily  . famotidine (PEPCID) IV  20 mg Intravenous Q12H  . insulin aspart  0-9 Units Subcutaneous Q4H  . levothyroxine  50 mcg Intravenous Daily   Continuous Infusions:    LOS: 0 days    Time spent: 35 minutes    Bennett Scrape, MD [facility] Pager [phone]  If 7PM-7AM, please contact night-coverage [email or web address] Password [email or web address] 12/29/2015, 10:40 AM

## 2015-12-29 NOTE — ED Provider Notes (Signed)
[facility] DEPT Provider Note   CSN: [id number] Arrival date & time: 12/28/15  2144   An emergency department physician performed an initial assessment on this suspected stroke patient at 2212.  History   Chief Complaint Chief Complaint  Patient presents with  . Altered Mental Status  . Code Stroke    HPI [name] is a 82 y.o. female.  82yo F w/ extensive PMH below who p/w altered mental status. Hx obtained by EMS, who report that pt was with family when they noted at 9pm a sudden change in mental status. She was non-verbal and confused, unable to answer questions. She is reportedly conversant at baseline and able to follow commands despite dementia/memory problems. For EMS, she has been awake but non-verbal.   LEVEL 5 CAVEAT DUE TO AMS   The history is provided by the EMS personnel.  Altered Mental Status      Past Medical History:  Diagnosis Date  . Abnormal nuclear stress test 06/01/09   Demonstrated a new area of infarct scar, peri-infarct ischemia seen in the inferolateral territory. EF eas normal at 70% with mild hypocontractility at the apex, distal inferolateral wall.  . Anemia   . Arthritis   . Cardiomyopathy, idiopathic ([place]) 02/08/2011  . Chronic diastolic CHF (congestive heart failure) (HCC)    Takes Lasix  . Chronic kidney disease (CKD), stage IV (severe) ([place])   . Coronary artery disease    a. Cath 09/2010 - med rx.  . Diabetes mellitus    type 2 NIDDM  . Dyslipidemia   . GERD (gastroesophageal reflux disease)   . Gout    takes allopurinol  . H/O echocardiogram 09/06/11   Indication- nonIschemic Cardiomyopathy. EF = now greater than 55% with no regional wall motion abnormalities. Tthere is mild to moderate trisuspid regurgitayion and mild pulmonary hypertension with an RVSP of 35 mmHg as well as stage 1 diastolic dysfunction and mild to moderate LVH.  Marland Kitchen Hx of transient ischemic attack (TIA)   . Hypertension   . Hypothyroidism    (SEVERE) Takes  Levothryroxine  . Irregular heartbeat   . Memory loss   . Myocardial infarct    x 3 unsure of years  . Nonischemic cardiomyopathy (HCC)    EF now is 55%, reduced due to myxedema, which is improved.  . Peripheral neuropathy ([place])   . Peripheral vascular disease ([place])    a. s/p L BKA.  . Shingles   . Stroke ([place])   . TIA (transient ischemic attack)   . Ulcer [facility])     Patient Active Problem List   Diagnosis Date Noted  . Altered mental state 12/29/2015  . Dysphagia   . FTT (failure to thrive) in adult 11/08/2015  . Nausea & vomiting 11/08/2015  . Metabolic acidosis 42/59/5638  . Hypertensive heart disease with heart failure ([place]) 10/07/2015  . Junctional bradycardia 09/18/2015  . Atypical chest pain 06/23/2014  . Neck pain   . Food impaction of esophagus   . Epiglottitis 04/13/2014  . Wound disruption, post-op, skin 02/05/2014  . Pre-operative cardiovascular examination 12/13/2013  . Foot pain 09/04/2013  . CKD (chronic kidney disease), stage IV ([place]) 09/04/2013  . Uncontrolled hypertension 08/23/2013  . Malnutrition of moderate degree ([place]) 08/21/2013  . Chronic diastolic heart failure ([place]) 08/15/2013  . Anemia of chronic disease 08/15/2013  . Hyperkalemia 08/15/2013  . Chronic total occlusion of artery of the extremities ([place]) 02/27/2013  . Dyslipidemia 01/05/2013  . Chronic renal insufficiency, stage IV (severe)-  HD started 01/05/13 01/05/2013  . Diabetes mellitus type 2 with peripheral artery disease ([place]) 01/05/2013  . Peripheral vascular disease ([place]) 10/19/2011  . CAD - moderate at cath July 2012 (medical Rx) 02/08/2011  . 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 Hypothyroid 02/08/2011    Past Surgical History:  Procedure Laterality Date  . AMPUTATION  07/05/2011   Procedure: AMPUTATION BELOW KNEE;  Surgeon: Angelia Mould, MD;  Location: [facility];  Service: Vascular;  Laterality: Left;  . ANGIOPLASTY  1988  . AV FISTULA PLACEMENT     . AV FISTULA PLACEMENT Right 12/24/2013   Procedure: INSERTION OF ARTERIOVENOUS (AV) GORE-TEX GRAFT ARM;  Surgeon: Angelia Mould, MD;  Location: [facility];  Service: Vascular;  Laterality: Right;  . [place]  09/28/07   Demonstrated multiple sequential lesions around 40 to 30% in the RCA territory.  . Duplex doppler  05/10/11   LE arterial dopplers demonstrate bilaterally reduced ABIs of 0.91 on right & 0.56 on left. She does report some decreased pain on the left, & there's moderate mixed-density plaque in the right SFA w/50 to 69% reduction. There's a 69% reduction in the left SFA & does appear to be occlusive disease of left posterior tibial artery. Right posterior dorsalis pedis artery demonstrates occlusive disease  . ESOPHAGOGASTRODUODENOSCOPY N/A 04/15/2014   Procedure: ESOPHAGOGASTRODUODENOSCOPY (EGD);  Surgeon: Milus Banister, MD;  Location: [facility];  Service: Endoscopy;  Laterality: N/A;  . EYE SURGERY     Left eye surgery; cataract removal  . INSERTION OF DIALYSIS CATHETER N/A 01/06/2013   Procedure: INSERTION OF DIALYSIS CATHETER;  Surgeon: Rosetta Posner, MD;  Location: [facility];  Service: Vascular;  Laterality: N/A;  . left bka  07/05/2011  . left lower extremity venous duplex Left 06/27/11   Summary: No evidence of DVT involving the left lower extremity and right common femoral vein.   Marland Kitchen LIGATION ARTERIOVENOUS GORTEX GRAFT Right 03/25/2014   Procedure: LIGATION ARTERIOVENOUS GORTEX GRAFT;  Surgeon: Angelia Mould, MD;  Location: [facility];  Service: Vascular;  Laterality: Right;  . LOWER EXTREMITY ANGIOGRAM N/A 10/31/2011   Procedure: LOWER EXTREMITY ANGIOGRAM;  Surgeon: Angelia Mould, MD;  Location[facility] C[facility] LAB;  Service: Cardiovascular;  Laterality: N/A;  . Lower extremity arterial evaluation  06/27/11   SUMMARY: Right: ABI not ascertained due to false elevation in BP secondary to calcification (posterior tibial  artery is non compressible). Left: ABI indicates moderate reduction in arterial flow. Bilateral: Great toe PPG waveforms indicate adequate perfusion. Great toe pressures not obtained due to patient's movements secondary to pain.  Marland Kitchen SHUNTOGRAM N/A 03/17/2014   Procedure: Earney Mallet;  Surgeon: Conrad Parkesburg, MD;  Location: [facility] CATH LAB;  Service: Cardiovascular;  Laterality: N/A;    OB History    No data available       Home Medications    Prior to Admission medications   Medication Sig Start Date End Date Taking? Authorizing Provider  acetaminophen (TYLENOL) 325 MG tablet Take 650 mg by mouth every 6 (six) hours as needed for mild pain.    Historical Provider, MD  albuterol-ipratropium (COMBIVENT) 18-103 MCG/ACT inhaler Inhale 2 puffs into the lungs at bedtime as needed for wheezing or shortness of breath.     Historical Provider, MD  allopurinol (ZYLOPRIM) 100 MG tablet Take 200 mg by mouth daily.     Historical Provider, MD  amLODipine (NORVASC) 10  MG tablet Take 1 tablet (10 mg total) by mouth daily. 10/22/15   Pixie Casino, MD  aspirin EC 81 MG tablet Take 81 mg by mouth every morning.     Historical Provider, MD  calcitRIOL (ROCALTROL) 0.25 MCG capsule Take 0.25 mcg by mouth every other day.  11/04/12   Historical Provider, MD  Calcium Carbonate-Vitamin D (CALCIUM-VITAMIN D) 500-200 MG-UNIT tablet Take 1 tablet by mouth daily.    Historical Provider, MD  clopidogrel (PLAVIX) 75 MG tablet Take 75 mg by mouth every evening.     Historical Provider, MD  docusate sodium (COLACE) 100 MG capsule Take 100 mg by mouth daily as needed for mild constipation.    Historical Provider, MD  famotidine (PEPCID) 20 MG tablet Take 1 tablet (20 mg total) by mouth at bedtime. 11/13/15   Eugenie Filler, MD  feeding supplement (BOOST / RESOURCE BREEZE) LIQD Take 1 Container by mouth 3 (three) times daily between meals. 11/13/15   Eugenie Filler, MD  furosemide (LASIX) 80 MG tablet TAKE 1 TABLET (80 MG  TOTAL) BY MOUTH 2 (TWO) TIMES DAILY. 01/07/15   Pixie Casino, MD  gabapentin (NEURONTIN) 300 MG capsule Take 300 mg by mouth 3 (three) times daily.    Historical Provider, MD  hydrALAZINE (APRESOLINE) 50 MG tablet Take 1 tablet (50 mg total) by mouth 2 (two) times daily. 10/22/15   Pixie Casino, MD  HYDROcodone-acetaminophen (NORCO/VICODIN) 5-325 MG tablet Take 1-2 tablets by mouth every 6 (six) hours as needed. Patient taking differently: Take 1-2 tablets by mouth every 6 (six) hours as needed for moderate pain.  06/12/15   Davonna Belling, MD  insulin glargine (LANTUS) 100 UNIT/ML injection Inject 0.22 mLs (22 Units total) into the skin daily. Patient taking differently: Inject 24 Units into the skin every morning.  08/23/13   Barton Dubois, MD  isosorbide mononitrate (IMDUR) 60 MG 24 hr tablet Take 60 mg by mouth daily.    Historical Provider, MD  levothyroxine (SYNTHROID, LEVOTHROID) 100 MCG tablet Take 1 tablet (100 mcg total) by mouth daily before breakfast. 11/13/15   Eugenie Filler, MD  metoCLOPramide (REGLAN) 5 MG tablet Take 1 tablet (5 mg total) by mouth 4 (four) times daily -  before meals and at bedtime. 11/13/15   Eugenie Filler, MD  nitroGLYCERIN (NITROSTAT) 0.4 MG SL tablet Place 0.4 mg under the tongue every 5 (five) minutes as needed for chest pain.     Historical Provider, MD  pravastatin (PRAVACHOL) 40 MG tablet Take 40 mg by mouth every evening.     Historical Provider, MD  RENVELA 800 MG tablet Take 800 mg by mouth 3 (three) times daily with meals.  07/04/13   Historical Provider, MD  sodium bicarbonate 650 MG tablet Take 1 tablet (650 mg total) by mouth daily. 11/13/15   Eugenie Filler, MD    Family History Family History  Problem Relation Age of Onset  . Heart attack Daughter   . Heart disease Daughter     Before age 23  . Cancer Sister     STOMACH  . Diabetes Sister   . Cancer Brother     BONE  . Diabetes Brother   . Hyperlipidemia Daughter   .  Hypertension Daughter   . Heart disease Daughter     before age 71  . Kidney disease Daughter   . Other Daughter     varicose veins  . Diabetes Daughter   . Heart disease  Son     before age 23  . Hyperlipidemia Son   . Hypertension Son   . Heart attack Son   . Anesthesia problems Neg Hx   . Hypotension Neg Hx   . Malignant hyperthermia Neg Hx   . Pseudochol deficiency Neg Hx     Social History Social History  Substance Use Topics  . Smoking status: Former Smoker    Packs/day: 0.50    Years: 40.00    Quit date: 07/05/1986  . Smokeless tobacco: Former Systems developer     Comment: quit smoking 1988  . Alcohol use No     Allergies   Aspirin   Review of Systems Review of Systems  Unable to perform ROS: Mental status change     Physical Exam Updated Vital Signs BP 190/85   Pulse 85   Temp 98.2 F (36.8 C)   Resp 15   SpO2 98%   Physical Exam  Constitutional: She appears well-developed and well-nourished. No distress.  Awake, alert  HENT:  Head: Normocephalic and atraumatic.  Eyes: Conjunctivae are normal. Pupils are equal, round, and reactive to light.  Staring left, unable to get patient to look right  Neck: Neck supple.  Cardiovascular: Normal rate, regular rhythm and normal heart sounds.   No murmur heard. Pulmonary/Chest: Effort normal and breath sounds normal. No respiratory distress.  Abdominal: Soft. Bowel sounds are normal. She exhibits no distension. There is no tenderness.  Musculoskeletal: She exhibits no edema or tenderness.  L AKA   Neurological: She is alert. She has normal reflexes. She exhibits abnormal muscle tone.  Able to say "yes" or "no" but unable to say much more and unable to follow commands; will not look R and will not hold R arm up but spontaneously uses R arm when rolling over, no clonus 5/5 strength L arm/hand  Skin: Skin is warm and dry.  Nursing note and vitals reviewed.    ED Treatments / Results  Labs (all labs ordered are  listed, but only abnormal results are displayed) Labs Reviewed  CBC - Abnormal; Notable for the following:       Result Value   Hemoglobin 10.8 (*)    HCT 35.1 (*)    RDW 16.5 (*)    All other components within normal limits  COMPREHENSIVE METABOLIC PANEL - Abnormal; Notable for the following:    Potassium 3.4 (*)    Glucose, Bld 161 (*)    Creatinine, Ser 2.12 (*)    Total Protein 6.3 (*)    Albumin 3.3 (*)    GFR calc non Af Amer 19 (*)    GFR calc Af Amer 22 (*)    All other components within normal limits  I-STAT CHEM 8, ED - Abnormal; Notable for the following:    Potassium 3.4 (*)    Creatinine, Ser 2.00 (*)    Glucose, Bld 156 (*)    Calcium, Ion 0.95 (*)    All other components within normal limits  CBG MONITORING, ED - Abnormal; Notable for the following:    Glucose-Capillary 153 (*)    All other components within normal limits  PROTIME-INR  APTT  DIFFERENTIAL  ETHANOL  RAPID URINE DRUG SCREEN, HOSP PERFORMED  URINALYSIS, ROUTINE W REFLEX MICROSCOPIC (NOT AT [facility])  Randolm Idol, ED    EKG  EKG Interpretation  Date/Time:  Monday December 28 2015 21:48:08 EDT Ventricular Rate:  84 PR Interval:    QRS Duration: 131 QT Interval:  416 QTC Calculation: 492  R Axis:   55 Text Interpretation:  Sinus rhythm Right bundle branch block Anterior infarct, age indeterminate Lateral leads are also involved since previous tracing tachycardia has improved Confirmed by Jolonda Gomm MD, [place] [phone]) on 12/28/2015 9:56:24 PM       Radiology Ct Head Wo Contrast  Result Date: 12/28/2015 CLINICAL DATA:  Code stroke, with right-sided hemineglect and altered mental status. Initial encounter. EXAM: CT HEAD WITHOUT CONTRAST TECHNIQUE: Contiguous axial images were obtained from the base of the skull through the vertex without intravenous contrast. COMPARISON:  CT of the head performed 06/15/2013 FINDINGS: Brain: No evidence of acute infarction, hemorrhage, extra-axial collection or mass  lesion/mass effect. There is diffuse prominence of the supratentorial ventricles, increased from the prior study, with suggestion of underlying transependymal resorption of CSF, concerning for mild hydrocephalus. Underlying mild cortical volume loss is noted. Mild cerebellar atrophy is noted. Scattered periventricular and subcortical white matter change likely reflects small vessel ischemic microangiopathy. The brainstem and fourth ventricle are within normal limits. The basal ganglia are unremarkable in appearance. The cerebral hemispheres demonstrate grossly normal gray-white differentiation. No mass effect or midline shift is seen. Vascular: No hyperdense vessel or unexpected calcification. Skull: There is no evidence of fracture; visualized osseous structures are unremarkable in appearance. Sinuses/Orbits: The orbits are within normal limits. The paranasal sinuses and mastoid air cells are well-aerated. Other: No significant soft tissue abnormalities are seen. IMPRESSION: 1. No acute intracranial pathology seen on CT. 2. Increased diffuse prominence of the supratentorial ventricles, with suggestion of underlying transependymal resorption of CSF, concerning for mild hydrocephalus. 3. Underlying mild cortical volume loss and scattered small vessel ischemic microangiopathy noted. These results were called by telephone at the time of interpretation on 12/28/2015 at 10:46 pm to Dr. Shon Hale, who verbally acknowledged these results. Electronically Signed   By: Garald Balding M.D.   On: 12/28/2015 22:49    Procedures .Critical Care Performed by: Sharlett Iles Authorized by: Sharlett Iles   Critical care provider statement:    Critical care time (minutes):  45   Critical care time was exclusive of:  Separately billable procedures and treating other patients   Critical care was necessary to treat or prevent imminent or life-threatening deterioration of the following conditions:  CNS failure or  compromise   Critical care was time spent personally by me on the following activities:  Development of treatment plan with patient or surrogate, discussions with consultants, evaluation of patient's response to treatment, examination of patient, obtaining history from patient or surrogate, review of old charts, re-evaluation of patient's condition, ordering and review of radiographic studies, ordering and review of laboratory studies and ordering and performing treatments and interventions   (including critical care time)  Medications Ordered in ED Medications - No data to display   Initial Impression / Assessment and Plan / ED Course  I have reviewed the triage vital signs and the nursing notes.  Pertinent labs & imaging results that were available during my care of the patient were reviewed by me and considered in my medical decision making (see chart for details).  Clinical Course   Pt w/ sudden onset AMS at 9pm witnessed by family. On EMS arrival, VS stable, airway intact. I noted R hemineglect on exam and given AMS and non-verbal, immediately called stroke alert and took to CT scanner, where pt met by Dr. Shon Hale, appreciate his assistance. CT negative for hemorrhage and initial BG normal. Sent above lab work and ordered CXR. Dr. Shon Hale suspected possible  stroke as pt has multiple risk factors, however with no family present to confirm time of onset and exam difficult, he felt that risk of tPA outweighed benefit. He recommended medicine admission for w/u of AMS and stroke w/u. Pt stable at time of admission. Discussed w/ Dr. Eulas Post and pt admitted for further care.  Final Clinical Impressions(s) / ED Diagnoses   Final diagnoses:  Altered mental status, unspecified altered mental status type  Hemi-neglect of right side    New Prescriptions New Prescriptions   No medications on file     Sharlett Iles, MD 12/30/15 1533

## 2015-12-29 NOTE — Progress Notes (Signed)
Subjective: Continues to be confused  Exam: Vitals:   12/29/15 1211 12/29/15 1413  BP: (!) 162/90 (!) 182/88  Pulse: 88 90  Resp: 20 18  Temp: 98.1 F (36.7 C) 98.4 F (36.9 C)   Gen: In bed, NAD Resp: non-labored breathing, no acute distress Abd: soft, nt  Neuro: MS: Awake, aphasic CN: Does not blink to threat from the right,? Mild left gaze preference Motor: Mild right hemiparesis Sensory: Does not respond as much on the right as the left noxious stimuli  Pertinent Labs: Elevated TSH  Impression: 82 year old female with new likely embolic posterior division MCA infarct  Recommendations: 1) Echo, carotid 2) frequent neuro checks 3) SLP, PT, OT 4) ASA 5) stroke team to follow tomorrow   Ritta Slot, MD [facility] [phone]  If 7pm- 7am, please page neurology on call as listed in AMION.

## 2015-12-29 NOTE — H&P (Signed)
History and Physical    Shannon SirenRoberta G Kuyper ZOX:[id number] DOB: 08/14/1923 DOA: 12/28/2015  PCP: Georgianne FickAMACHANDRAN,AJITH, MD   Patient coming from: Home  Chief Complaint: Acute onset mental status changes  HPI: [name] is a 82 y.o. woman with a history of prior TIA (family denies history of prior CVA), Left BKA, PVD, CKD IV (she is not a dialysis patient), HTN, HLD, CAD, chronic diastolic heart failure, and DM who presents to the ED after her son called 911 promptly after witnessing acute mental status changes in the patient including slurred speech, aphasia, decreased awareness, right sided weakness, and right sided facial droop. CODE STROKE was called upon arrival.  Neurology has seen th patient.  She was not felt to be a tPA candidate due to inconsistent physical exam findings and concerns for acute encephalopathy.  Head CT negative for acute process (though chronic changes were noted).  Hospitalist asked to admit.   ED Course: EKG shows NSR, RBBB.  Chest xray negative for acute process.  U/A pending.  She has had systolic blood pressures greater than 200.  Review of Systems: She has actually complained of headache since Thursday.  No LUTS.  No other localizing pain complaints per family.  Otherwise, 10 systems reviewed and negative except as stated in the HPI.      Past Medical History:  Diagnosis Date  . Abnormal nuclear stress test 06/01/09   Demonstrated a new area of infarct scar, peri-infarct ischemia seen in the inferolateral territory. EF eas normal at 70% with mild hypocontractility at the apex, distal inferolateral wall.  . Anemia   . Arthritis   . Cardiomyopathy, idiopathic (HCC) 02/08/2011  . Chronic diastolic CHF (congestive heart failure) (HCC)    Takes Lasix  . Chronic kidney disease (CKD), stage IV (severe) (HCC)   . Coronary artery disease    a. Cath 09/2010 - med rx.  . Diabetes mellitus    type 2 NIDDM  . Dyslipidemia   . GERD (gastroesophageal reflux disease)   .  Gout    takes allopurinol  . H/O echocardiogram 09/06/11   Indication- nonIschemic Cardiomyopathy. EF = now greater than 55% with no regional wall motion abnormalities. Tthere is mild to moderate trisuspid regurgitayion and mild pulmonary hypertension with an RVSP of 35 mmHg as well as stage 1 diastolic dysfunction and mild to moderate LVH.  Marland Kitchen. Hx of transient ischemic attack (TIA)   . Hypertension   . Hypothyroidism    (SEVERE) Takes Levothryroxine  . Irregular heartbeat   . Memory loss   . Myocardial infarct    x 3 unsure of years  . Nonischemic cardiomyopathy (HCC)    EF now is 55%, reduced due to myxedema, which is improved.  . Peripheral neuropathy (HCC)   . Peripheral vascular disease (HCC)    a. s/p L BKA.  . Shingles   . Stroke (HCC)   . TIA (transient ischemic attack)   . Ulcer [facility](HCC)     Past Surgical History:  Procedure Laterality Date  . AMPUTATION  07/05/2011   Procedure: AMPUTATION BELOW KNEE;  Surgeon: Chuck Hinthristopher S Dickson, MD;  [facility];  Service: Vascular;  Laterality: Left;  . ANGIOPLASTY  1988  . AV FISTULA PLACEMENT    . AV FISTULA PLACEMENT Right 12/24/2013   Procedure: INSERTION OF ARTERIOVENOUS (AV) GORE-TEX GRAFT ARM;  Surgeon: Chuck Hinthristopher S Dickson, MD;  [facility];  Service: Vascular;  Laterality: Right;  . [facility]     [facility]  .  CARDIAC CATHETERIZATION  09/28/07   Demonstrated multiple sequential lesions around 40 to 30% in the RCA territory.  . Duplex doppler  05/10/11   LE arterial dopplers demonstrate bilaterally reduced ABIs of 0.91 on right & 0.56 on left. She does report some decreased pain on the left, & there's moderate mixed-density plaque in the right SFA w/50 to 69% reduction. There's a 69% reduction in the left SFA & does appear to be occlusive disease of left posterior tibial artery. Right posterior dorsalis pedis artery demonstrates occlusive disease  . ESOPHAGOGASTRODUODENOSCOPY N/A 04/15/2014   Procedure:  ESOPHAGOGASTRODUODENOSCOPY (EGD);  Surgeon: Rachael Fee, MD;  Location: [facility] ENDOSCOPY;  Service: Endoscopy;  Laterality: N/A;  . EYE SURGERY     Left eye surgery; cataract removal  . INSERTION OF DIALYSIS CATHETER N/A 01/06/2013   Procedure: INSERTION OF DIALYSIS CATHETER;  Surgeon: Larina Earthly, MD;  Location: [facility] OR;  Service: Vascular;  Laterality: N/A;  . left bka  07/05/2011  . left lower extremity venous duplex Left 06/27/11   Summary: No evidence of DVT involving the left lower extremity and right common femoral vein.   Marland Kitchen LIGATION ARTERIOVENOUS GORTEX GRAFT Right 03/25/2014   Procedure: LIGATION ARTERIOVENOUS GORTEX GRAFT;  Surgeon: Chuck Hint, MD;  Location: [facility] OR;  Service: Vascular;  Laterality: Right;  . LOWER EXTREMITY ANGIOGRAM N/A 10/31/2011   Procedure: LOWER EXTREMITY ANGIOGRAM;  Surgeon: Chuck Hint, MD;  Location: [facility] CATH LAB;  Service: Cardiovascular;  Laterality: N/A;  . Lower extremity arterial evaluation  06/27/11   SUMMARY: Right: ABI not ascertained due to false elevation in BP secondary to calcification (posterior tibial artery is non compressible). Left: ABI indicates moderate reduction in arterial flow. Bilateral: Great toe PPG waveforms indicate adequate perfusion. Great toe pressures not obtained due to patient's movements secondary to pain.  Marland Kitchen SHUNTOGRAM N/A 03/17/2014   Procedure: Betsey Amen;  Surgeon: Fransisco Hertz, MD;  Location: [facility] CATH LAB;  Service: Cardiovascular;  Laterality: N/A;     reports that she quit smoking about 29 years ago. She has a 20.00 pack-year smoking history. She has quit using smokeless tobacco. She reports that she does not drink alcohol or use drugs.  She is a widow. She has three adult children; no designated POA. She lives with her son, who provides most of the clinical history.  Allergies  Allergen Reactions  . Aspirin Nausea And Vomiting    325 mg (adult strength) Patient stated that she can take the coated aspirin  with no problems.   Aspirin causes GI upset.  No history of bleeding ulcers.  Family tells me that she can take coated aspirin if needed.  Family History  Problem Relation Age of Onset  . Heart attack Daughter   . Heart disease Daughter     Before age 16  . Cancer Sister     STOMACH  . Diabetes Sister   . Cancer Brother     BONE  . Diabetes Brother   . Hyperlipidemia Daughter   . Hypertension Daughter   . Heart disease Daughter     before age 58  . Kidney disease Daughter   . Other Daughter     varicose veins  . Diabetes Daughter   . Heart disease Son     before age 3  . Hyperlipidemia Son   . Hypertension Son   . Heart attack Son   . Anesthesia problems Neg Hx   . Hypotension Neg Hx   . Malignant hyperthermia  Neg Hx   . Pseudochol deficiency Neg Hx      Prior to Admission medications   Medication Sig Start Date End Date Taking? Authorizing Provider  acetaminophen (TYLENOL) 325 MG tablet Take 650 mg by mouth every 6 (six) hours as needed for mild pain.    Historical Provider, MD  albuterol-ipratropium (COMBIVENT) 18-103 MCG/ACT inhaler Inhale 2 puffs into the lungs at bedtime as needed for wheezing or shortness of breath.     Historical Provider, MD  allopurinol (ZYLOPRIM) 100 MG tablet Take 200 mg by mouth daily.     Historical Provider, MD  amLODipine (NORVASC) 10 MG tablet Take 1 tablet (10 mg total) by mouth daily. 10/22/15   Chrystie Nose, MD  aspirin EC 81 MG tablet Take 81 mg by mouth every morning.     Historical Provider, MD  calcitRIOL (ROCALTROL) 0.25 MCG capsule Take 0.25 mcg by mouth every other day.  11/04/12   Historical Provider, MD  Calcium Carbonate-Vitamin D (CALCIUM-VITAMIN D) 500-200 MG-UNIT tablet Take 1 tablet by mouth daily.    Historical Provider, MD  clopidogrel (PLAVIX) 75 MG tablet Take 75 mg by mouth every evening.     Historical Provider, MD  docusate sodium (COLACE) 100 MG capsule Take 100 mg by mouth daily as needed for mild constipation.     Historical Provider, MD  famotidine (PEPCID) 20 MG tablet Take 1 tablet (20 mg total) by mouth at bedtime. 11/13/15   Rodolph Bong, MD  feeding supplement (BOOST / RESOURCE BREEZE) LIQD Take 1 Container by mouth 3 (three) times daily between meals. 11/13/15   Rodolph Bong, MD  furosemide (LASIX) 80 MG tablet TAKE 1 TABLET (80 MG TOTAL) BY MOUTH 2 (TWO) TIMES DAILY. 01/07/15   Chrystie Nose, MD  gabapentin (NEURONTIN) 300 MG capsule Take 300 mg by mouth 3 (three) times daily.    Historical Provider, MD  hydrALAZINE (APRESOLINE) 50 MG tablet Take 1 tablet (50 mg total) by mouth 2 (two) times daily. 10/22/15   Chrystie Nose, MD  HYDROcodone-acetaminophen (NORCO/VICODIN) 5-325 MG tablet Take 1-2 tablets by mouth every 6 (six) hours as needed. Patient taking differently: Take 1-2 tablets by mouth every 6 (six) hours as needed for moderate pain.  06/12/15   Benjiman Core, MD  insulin glargine (LANTUS) 100 UNIT/ML injection Inject 0.22 mLs (22 Units total) into the skin daily. Patient taking differently: Inject 24 Units into the skin every morning.  08/23/13   [name], MD  isosorbide mononitrate (IMDUR) 60 MG 24 hr tablet Take 60 mg by mouth daily.    Historical Provider, MD  levothyroxine (SYNTHROID, LEVOTHROID) 100 MCG tablet Take 1 tablet (100 mcg total) by mouth daily before breakfast. 11/13/15   Rodolph Bong, MD  metoCLOPramide (REGLAN) 5 MG tablet Take 1 tablet (5 mg total) by mouth 4 (four) times daily -  before meals and at bedtime. 11/13/15   Rodolph Bong, MD  nitroGLYCERIN (NITROSTAT) 0.4 MG SL tablet Place 0.4 mg under the tongue every 5 (five) minutes as needed for chest pain.     Historical Provider, MD  pravastatin (PRAVACHOL) 40 MG tablet Take 40 mg by mouth every evening.     Historical Provider, MD  RENVELA 800 MG tablet Take 800 mg by mouth 3 (three) times daily with meals.  07/04/13   Historical Provider, MD  sodium bicarbonate 650 MG tablet Take 1 tablet (650 mg  total) by mouth daily. 11/13/15   Rodolph Bong,  MD    Physical Exam: Vitals:   12/28/15 2330 12/28/15 2345 12/29/15 0030 12/29/15 0045  BP: 145/98 160/98 (!) 207/195 (!) 204/110  Pulse: 89 86 83 80  Resp: 18  20 21   Temp:      TempSrc:      SpO2: 100% 100% 99% 98%      Constitutional: NAD, but disoriented, answers "NO" to most questions Vitals:   12/28/15 2330 12/28/15 2345 12/29/15 0030 12/29/15 0045  BP: 145/98 160/98 (!) 207/195 (!) 204/110  Pulse: 89 86 83 80  Resp: 18  20 21   Temp:      TempSrc:      SpO2: 100% 100% 99% 98%   Eyes: Patient would not cooperate with eye exam, right pupil appeared to be reactive but I could not get a good look at the left. ENMT: Mucous membranes appear dry.  She will not open her mouth for complete exam.    Neck: normal appearance, supple Respiratory: Diminished bilaterally, no apparent wheeze but she will not take deep breaths on command.  Normal respiratory effort. No accessory muscle use.  Cardiovascular: Normal rate, regular rhythm, no murmurs / rubs / gallops. No extremity edema in right leg. 2+ pedal pulses in right foot.    GI: abdomen is soft and compressible.  No distention.  No apparent tenderness.  Bowel sounds are present. Musculoskeletal:  Left BKA.  No contractures. RUE is flaccid.  Skin: no rashes, warm and dry Neurologic: Left gaze preference with right sided neglect though she will move her right arm intermittently (but not on command).  She has evidence of right facial droop.  She answers most questions "NO".  Exam otherwise compromised because the patient will not cooperate. Psychiatric: Disoriented with impaired judgement.    Labs on Admission: I have personally reviewed following labs and imaging studies  CBC:  Recent Labs Lab 12/28/15 2226 12/28/15 2231  WBC 6.4  --   NEUTROABS 3.3  --   HGB 10.8* 12.2  HCT 35.1* 36.0  MCV 90.2  --   PLT 299  --    Basic Metabolic Panel:  Recent Labs Lab  12/28/15 2226 12/28/15 2231  NA 139 139  K 3.4* 3.4*  CL 103 104  CO2 22  --   GLUCOSE 161* 156*  BUN 11 16  CREATININE 2.12* 2.00*  CALCIUM 8.9  --    GFR: CrCl cannot be calculated (Unknown ideal weight.). Liver Function Tests:  Recent Labs Lab 12/28/15 2226  AST 34  ALT 19  ALKPHOS 66  BILITOT 0.4  PROT 6.3*  ALBUMIN 3.3*   Coagulation Profile:  Recent Labs Lab 12/28/15 2226  INR 1.14   CBG:  Recent Labs Lab 12/28/15 2230  GLUCAP 153*   Urine analysis: Pending  Radiological Exams on Admission: Dg Chest 2 View  Result Date: 12/29/2015 CLINICAL DATA:  82 y/o F; altered mental status and possible stroke. EXAM: CHEST  2 VIEW COMPARISON:  06/22/2014 chest radiograph. FINDINGS: Stable cardiomegaly given projection and technique. Aortic atherosclerosis with arch calcifications. Bibasilar reticular opacities may represent scarring or atelectasis. No focal consolidation. No pneumothorax. No pleural effusion. No acute osseous abnormality is identified. IMPRESSION: No active cardiopulmonary disease. Electronically Signed   By: Mitzi Hansen M.D.   On: 12/29/2015 01:03   Ct Head Wo Contrast  Result Date: 12/28/2015 CLINICAL DATA:  Code stroke, with right-sided hemineglect and altered mental status. Initial encounter. EXAM: CT HEAD WITHOUT CONTRAST TECHNIQUE: Contiguous axial images were obtained from the  base of the skull through the vertex without intravenous contrast. COMPARISON:  CT of the head performed 06/15/2013 FINDINGS: Brain: No evidence of acute infarction, hemorrhage, extra-axial collection or mass lesion/mass effect. There is diffuse prominence of the supratentorial ventricles, increased from the prior study, with suggestion of underlying transependymal resorption of CSF, concerning for mild hydrocephalus. Underlying mild cortical volume loss is noted. Mild cerebellar atrophy is noted. Scattered periventricular and subcortical white matter change  likely reflects small vessel ischemic microangiopathy. The brainstem and fourth ventricle are within normal limits. The basal ganglia are unremarkable in appearance. The cerebral hemispheres demonstrate grossly normal gray-white differentiation. No mass effect or midline shift is seen. Vascular: No hyperdense vessel or unexpected calcification. Skull: There is no evidence of fracture; visualized osseous structures are unremarkable in appearance. Sinuses/Orbits: The orbits are within normal limits. The paranasal sinuses and mastoid air cells are well-aerated. Other: No significant soft tissue abnormalities are seen. IMPRESSION: 1. No acute intracranial pathology seen on CT. 2. Increased diffuse prominence of the supratentorial ventricles, with suggestion of underlying transependymal resorption of CSF, concerning for mild hydrocephalus. 3. Underlying mild cortical volume loss and scattered small vessel ischemic microangiopathy noted. These results were called by telephone at the time of interpretation on 12/28/2015 at 10:46 pm to Dr. Roxy Manns, who verbally acknowledged these results. Electronically Signed   By: Roanna Raider M.D.   On: 12/28/2015 22:49    EKG: Independently reviewed. Noted above.  Assessment/Plan Principal Problem:   Acute right-sided weakness Active Problems:   CAD - moderate at cath July 2012 (medical Rx)   Hypothyroid   Chronic renal insufficiency, stage IV (severe)- HD started 01/05/13   Diabetes mellitus type 2 with peripheral artery disease (HCC)   Altered mental state      Acute right sided weakness with facial droop and right sided neglect concerning for acute stroke --Admit to telemetry --Neurology has seen, recommendations appreciated --Will check MRI brain, U/A, TFTs, EEG  --If MRI of the brain is positive, patient will need complete CVA work-up --PT/OT/Speech evaluations --NPO until she passes a swallow evaluation (holding home medications) --Aspirin per rectum for  now (holding plavix until she can swallow) --Resume statin when tolerating PO  History of hypothyroidism --IV levothyroxine for now, may need increased dose if TSH still markedly elevated  HTN --IV hydralazine prn until tolerating PO.  Will allow permissive hypertension to some degree while ruling out CVA.  Mild hypokalemia --IV KCl one time now  DVT prophylaxis: SCD to right leg Code Status: FULL Family Communication: 3 children, 2 great granddaughters present in the ED at time of discharge. Disposition Plan: To be determined. Consults called: Neurology Admission status: Inpatient, telemetry   TIME SPENT: 60 minutes   Jerene Bears MD [facility] Pager [phone]  If 7PM-7AM, please contact night-coverage [email or web address] Password [email or web address]  12/29/2015, 1:58 AM

## 2015-12-29 NOTE — Procedures (Signed)
ELECTROENCEPHALOGRAM REPORT  Date of Study: 12/29/2015  Patient's Name: [name] MRN: [id number] Date of Birth: [birth year]  Referring Provider: Michael Litter, MD  Clinical History: 82 year old woman with acute altered mental status  Medications: Hospital Medications L1 acetaminophen (TYLENOL) suppository 650 mg  L1 acetaminophen (TYLENOL) tablet 650 mg  L2 aspirin suppository 300 mg  L2 aspirin tablet 325 mg   famotidine (PEPCID) IVPB 20 mg premix   hydrALAZINE (APRESOLINE) injection 20 mg   insulin aspart (novoLOG) injection 0-9 Units   levothyroxine (SYNTHROID, LEVOTHROID) injection 50 mcg  Outpatient Medications  acetaminophen (TYLENOL) 325 MG tablet   albuterol-ipratropium (COMBIVENT) 18-103 MCG/ACT inhaler   allopurinol (ZYLOPRIM) 100 MG tablet   amLODipine (NORVASC) 10 MG tablet   aspirin EC 81 MG tablet   calcitRIOL (ROCALTROL) 0.25 MCG capsule   Calcium Carbonate-Vitamin D (CALCIUM-VITAMIN D) 500-200 MG-UNIT tablet   clopidogrel (PLAVIX) 75 MG tablet   docusate sodium (COLACE) 100 MG capsule   famotidine (PEPCID) 20 MG tablet   feeding supplement (BOOST / RESOURCE BREEZE) LIQD   furosemide (LASIX) 80 MG tablet   gabapentin (NEURONTIN) 300 MG capsule   hydrALAZINE (APRESOLINE) 50 MG tablet   HYDROcodone-acetaminophen (NORCO/VICODIN) 5-325 MG tablet   insulin glargine (LANTUS) 100 UNIT/ML injection   isosorbide mononitrate (IMDUR) 60 MG 24 hr tablet   levothyroxine (SYNTHROID, LEVOTHROID) 100 MCG tablet   metoCLOPramide (REGLAN) 5 MG tablet   nitroGLYCERIN (NITROSTAT) 0.4 MG SL tablet   pravastatin (PRAVACHOL) 40 MG tablet   RENVELA 800 MG tablet   sodium bicarbonate 650 MG tablet   Technical Summary: A multichannel digital EEG recording measured by the international 10-20 system with electrodes applied with paste and impedances below 5000 ohms performed as portable with EKG monitoring in an awake patient.  Hyperventilation and photic stimulation were not  performed.  The digital EEG was referentially recorded, reformatted, and digitally filtered in a variety of bipolar and referential montages for optimal display.   Description: The patient is awake during the recording.  During maximal wakefulness, there is a symmetric, medium voltage 6-7 Hz posterior dominant rhythm that attenuates with eye opening. This is admixed with diffuse 4-5 Hz theta and 2-3 Hz delta slowing of the waking background.  There were no epileptiform discharges or electrographic seizures seen.    EKG lead was unremarkable.  Impression: This awake EEG is abnormal due to diffuse slowing of the waking background.  Clinical Correlation of the above findings indicates diffuse cerebral dysfunction that is non-specific in etiology and can be seen with hypoxic/ischemic injury, toxic/metabolic encephalopathies, neurodegenerative disorders, or medication effect.  The absence of epileptiform discharges does not rule out a clinical diagnosis of epilepsy.  Clinical correlation is advised.   Shon Millet, DO

## 2015-12-29 NOTE — Progress Notes (Signed)
EEG Completed; Results Pending  

## 2015-12-29 NOTE — Progress Notes (Signed)
Patient received from the ED at 0125, alert and non interactive. Patient is not following any command. VS taken and stable. Cardiac monitoring initiated. Will monitor closely overnight.

## 2015-12-29 NOTE — ED Notes (Signed)
Patient normally talks and is AOx4 - per family. Does not recognize family at this time. Patient uses wheelchair at home - left BKA.  Patient is talking. Speech clear. Family at bedside.  Patient failed swallow screen. NPO.

## 2015-12-30 ENCOUNTER — Inpatient Hospital Stay (HOSPITAL_COMMUNITY): Payer: Medicare Other

## 2015-12-30 DIAGNOSIS — E1121 Type 2 diabetes mellitus with diabetic nephropathy: Secondary | ICD-10-CM

## 2015-12-30 DIAGNOSIS — I63512 Cerebral infarction due to unspecified occlusion or stenosis of left middle cerebral artery: Secondary | ICD-10-CM

## 2015-12-30 DIAGNOSIS — Z794 Long term (current) use of insulin: Secondary | ICD-10-CM

## 2015-12-30 DIAGNOSIS — M6289 Other specified disorders of muscle: Secondary | ICD-10-CM

## 2015-12-30 DIAGNOSIS — E038 Other specified hypothyroidism: Secondary | ICD-10-CM

## 2015-12-30 DIAGNOSIS — I6330 Cerebral infarction due to thrombosis of unspecified cerebral artery: Secondary | ICD-10-CM | POA: Insufficient documentation

## 2015-12-30 LAB — HEMOGLOBIN A1C
Hgb A1c MFr Bld: 6.9 % — ABNORMAL HIGH (ref 4.8–5.6)
Mean Plasma Glucose: 151 mg/dL

## 2015-12-30 LAB — GLUCOSE, CAPILLARY
GLUCOSE-CAPILLARY: 109 mg/dL — AB (ref 65–99)
GLUCOSE-CAPILLARY: 146 mg/dL — AB (ref 65–99)
Glucose-Capillary: 123 mg/dL — ABNORMAL HIGH (ref 65–99)
Glucose-Capillary: 126 mg/dL — ABNORMAL HIGH (ref 65–99)
Glucose-Capillary: 135 mg/dL — ABNORMAL HIGH (ref 65–99)
Glucose-Capillary: 170 mg/dL — ABNORMAL HIGH (ref 65–99)

## 2015-12-30 MED ORDER — SODIUM CHLORIDE 0.9 % IV SOLN
INTRAVENOUS | Status: DC
  Administered 2015-12-30 – 2016-01-02 (×5): via INTRAVENOUS

## 2015-12-30 MED ORDER — POTASSIUM CHLORIDE 10 MEQ/100ML IV SOLN
10.0000 meq | INTRAVENOUS | Status: AC
Start: 2015-12-30 — End: 2015-12-30
  Administered 2015-12-30 (×3): 10 meq via INTRAVENOUS
  Filled 2015-12-30 (×3): qty 100

## 2015-12-30 MED ORDER — CHLORHEXIDINE GLUCONATE 0.12 % MT SOLN
15.0000 mL | Freq: Two times a day (BID) | OROMUCOSAL | Status: DC
  Administered 2015-12-30 – 2016-01-05 (×12): 15 mL via OROMUCOSAL
  Filled 2015-12-30 (×14): qty 15

## 2015-12-30 MED ORDER — ENOXAPARIN SODIUM 40 MG/0.4ML ~~LOC~~ SOLN
40.0000 mg | SUBCUTANEOUS | Status: DC
  Administered 2015-12-30 – 2016-01-03 (×5): 40 mg via SUBCUTANEOUS
  Filled 2015-12-30 (×5): qty 0.4

## 2015-12-30 MED ORDER — ORAL CARE MOUTH RINSE
15.0000 mL | Freq: Two times a day (BID) | OROMUCOSAL | Status: DC
  Administered 2015-12-31 – 2016-01-05 (×7): 15 mL via OROMUCOSAL

## 2015-12-30 NOTE — Evaluation (Signed)
Speech Language Pathology Evaluation Patient Details Name: [name] MRN: [id number] DOB: 1924-02-17 Today's Date: 12/30/2015 Time: 4888-9169 SLP Time Calculation (min) (ACUTE ONLY): 14 min  Problem List:  Patient Active Problem List   Diagnosis Date Noted  . Altered mental state 12/29/2015  . Acute right-sided weakness 12/29/2015  . Dysphagia   . FTT (failure to thrive) in adult 11/08/2015  . Nausea & vomiting 11/08/2015  . Metabolic acidosis 11/08/2015  . Hypertensive heart disease with heart failure (HCC) 10/07/2015  . Junctional bradycardia 09/18/2015  . Atypical chest pain 06/23/2014  . Neck pain   . Food impaction of esophagus   . Epiglottitis 04/13/2014  . Wound disruption, post-op, skin 02/05/2014  . Pre-operative cardiovascular examination 12/13/2013  . Foot pain 09/04/2013  . CKD (chronic kidney disease), stage IV (HCC) 09/04/2013  . Uncontrolled hypertension 08/23/2013  . Malnutrition of moderate degree (HCC) 08/21/2013  . Chronic diastolic heart failure (HCC) 08/15/2013  . Anemia of chronic disease 08/15/2013  . Hyperkalemia 08/15/2013  . 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 Dyslipidemia 01/05/2013  . Chronic renal insufficiency, stage IV (severe)- HD started 01/05/13 01/05/2013  . Diabetes mellitus type 2 with peripheral artery disease (HCC) 01/05/2013  . Peripheral vascular disease (HCC) 10/19/2011  . CAD - moderate at cath July 2012 (medical Rx) 02/08/2011  . 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 Hypothyroid 02/08/2011   Past Medical History:  Past Medical History:  Diagnosis Date  . Abnormal nuclear stress test 06/01/09   Demonstrated a new area of infarct scar, peri-infarct ischemia seen in the inferolateral territory. EF eas normal at 70% with mild hypocontractility at the apex, distal inferolateral wall.  . Anemia   . Arthritis   . Cardiomyopathy, idiopathic (HCC) 02/08/2011  .  Chronic diastolic CHF (congestive heart failure) (HCC)    Takes Lasix  . Chronic kidney disease (CKD), stage IV (severe) (HCC)   . Coronary artery disease    a. Cath 09/2010 - med rx.  . Diabetes mellitus    type 2 NIDDM  . Dyslipidemia   . GERD (gastroesophageal reflux disease)   . Gout    takes allopurinol  . H/O echocardiogram 09/06/11   Indication- nonIschemic Cardiomyopathy. EF = now greater than 55% with no regional wall motion abnormalities. Tthere is mild to moderate trisuspid regurgitayion and mild pulmonary hypertension with an RVSP of 35 mmHg as well as stage 1 diastolic dysfunction and mild to moderate LVH.  Marland Kitchen Hx of transient ischemic attack (TIA)   . Hypertension   . Hypothyroidism    (SEVERE) Takes Levothryroxine  . Irregular heartbeat   . Memory loss   . Myocardial infarct    x 3 unsure of years  . Nonischemic cardiomyopathy (HCC)    EF now is 55%, reduced due to myxedema, which is improved.  . Peripheral neuropathy (HCC)   . Peripheral vascular disease (HCC)    a. s/p L BKA.  . Shingles   . Stroke (HCC)   . TIA (transient ischemic attack)   . Ulcer [facility])    Past Surgical History:  Past Surgical History:  Procedure Laterality Date  . AMPUTATION  07/05/2011   Procedure: AMPUTATION BELOW KNEE;  Surgeon: Chuck Hint, MD;  Location: [facility] OR;  Service: Vascular;  Laterality: Left;  . ANGIOPLASTY  1988  . AV FISTULA PLACEMENT    . AV FISTULA PLACEMENT Right 12/24/2013   Procedure: INSERTION OF ARTERIOVENOUS (AV) GORE-TEX GRAFT  ARM;  Surgeon: Chuck Hint, MD;  Location: [facility] OR;  Service: Vascular;  Laterality: Right;  . BACK SURGERY     [facility]  . CARDIAC CATHETERIZATION  09/28/07   Demonstrated multiple sequential lesions around 40 to 30% in the RCA territory.  . Duplex doppler  05/10/11   LE arterial dopplers demonstrate bilaterally reduced ABIs of 0.91 on right & 0.56 on left. She does report some decreased pain on the left, & there's  moderate mixed-density plaque in the right SFA w/50 to 69% reduction. There's a 69% reduction in the left SFA & does appear to be occlusive disease of left posterior tibial artery. Right posterior dorsalis pedis artery demonstrates occlusive disease  . ESOPHAGOGASTRODUODENOSCOPY N/A 04/15/2014   Procedure: ESOPHAGOGASTRODUODENOSCOPY (EGD);  Surgeon: Rachael Fee, MD;  Location: [facility] ENDOSCOPY;  Service: Endoscopy;  Laterality: N/A;  . EYE SURGERY     Left eye surgery; cataract removal  . INSERTION OF DIALYSIS CATHETER N/A 01/06/2013   Procedure: INSERTION OF DIALYSIS CATHETER;  Surgeon: Larina Earthly, MD;  Location: [facility] OR;  Service: Vascular;  Laterality: N/A;  . left bka  07/05/2011  . left lower extremity venous duplex Left 06/27/11   Summary: No evidence of DVT involving the left lower extremity and right common femoral vein.   Marland Kitchen LIGATION ARTERIOVENOUS GORTEX GRAFT Right 03/25/2014   Procedure: LIGATION ARTERIOVENOUS GORTEX GRAFT;  Surgeon: Chuck Hint, MD;  Location: [facility] OR;  Service: Vascular;  Laterality: Right;  . LOWER EXTREMITY ANGIOGRAM N/A 10/31/2011   Procedure: LOWER EXTREMITY ANGIOGRAM;  Surgeon: Chuck Hint, MD;  Location: [facility] CATH LAB;  Service: Cardiovascular;  Laterality: N/A;  . Lower extremity arterial evaluation  06/27/11   SUMMARY: Right: ABI not ascertained due to false elevation in BP secondary to calcification (posterior tibial artery is non compressible). Left: ABI indicates moderate reduction in arterial flow. Bilateral: Great toe PPG waveforms indicate adequate perfusion. Great toe pressures not obtained due to patient's movements secondary to pain.  Marland Kitchen SHUNTOGRAM N/A 03/17/2014   Procedure: Betsey Amen;  Surgeon: Fransisco Hertz, MD;  Location: [facility] CATH LAB;  Service: Cardiovascular;  Laterality: N/A;   HPI:  82 yo female adm to [facility] with right sided weakness, speech deficits, right facial droop.  Found to have left MCA/PCA CVA.  Pt with right inattention,  dysphagia, aphasia.  She has h/o esophageal deficits - including dysmotility and diverticulum 11/09/2015.     Assessment / Plan / Recommendation Clinical Impression  Pt presents with severe cognitive linguistic deficits = likely exacerbated by decreased attention/lethargy.  She did have her eyes open during approximately 1/2 of the session but did not follow one step directions despite total cues/hand-over-hand assistance.   Right facial/labial weakness obvious but pt did not follow directions for Oral motor exam.  Pt moaned during session - uncertain if attempting to express herself or if due to aspiration of secretions/basic physicological response. Son present and participative during evaluation.  Advised son Mathis Fare to findings and recommendations to attempt to facilitate basic communication.  SLP to follow up for trial SLP treatment for basic communication.       SLP Assessment  Patient needs continued Speech Lanaguage Pathology Services    Follow Up Recommendations   (tbd)    Frequency and Duration min 1 x/week  1 week      SLP Evaluation Cognition  Overall Cognitive Status: Difficult to assess Orientation Level: Disoriented X4 Attention: Focused Focused Attention: Impaired Memory: Impaired Awareness: Impaired Problem  Solving: Impaired Safety/Judgment: Impaired (resistant to oral care despite education to need, language deficits likely impact)       Comprehension  Auditory Comprehension Overall Auditory Comprehension: Impaired Yes/No Questions: Impaired Basic Biographical Questions: 0-25% accurate Commands: Impaired One Step Basic Commands: 0-24% accurate Interfering Components: Attention Visual Recognition/Discrimination Discrimination: Not tested Reading Comprehension Reading Status: Not tested    Expression Verbal Expression Overall Verbal Expression: Impaired Initiation: Impaired Automatic Speech:  (pt did not attempt automatic speech tasks, she did have her eyes  open and briefly looked around during session) Repetition:  (dnt) Naming:  (dnt) Interfering Components: Attention Written Expression Dominant Hand: Right Written Expression: Not tested   Oral / Motor  Oral Motor/Sensory Function Overall Oral Motor/Sensory Function: Other (comment) (pt did not follow commands for OME but decreased right facial/labial strength and asymmetry noted) Motor Speech Overall Motor Speech: Impaired Respiration: Impaired Phonation: Other (comment) (pt does not phonate but moaned occasionally with decreased phonatory strength) Articulation: Impaired (pt did not attempt to articulate, open mouth posture with moaning)   GO                    Mills KollerKimball, Kanae Ignatowski Ann Josefine Fuhr, MS [facility] SLP [phone]

## 2015-12-30 NOTE — Progress Notes (Signed)
VASCULAR LAB PRELIMINARY  PRELIMINARY  PRELIMINARY  PRELIMINARY  Carotid duplex completed.    Preliminary report:  Bilateral:  1-39% ICA stenosis. Unable to obtain vertebral artery images bilaterally due to constant movement of the head    Leslea Vowles, RVS 12/30/2015, 5:44 PM

## 2015-12-30 NOTE — Care Management Note (Signed)
Case Management Note  Patient Details  Name: [name] MRN: [id number] Date of Birth: [birth year]  Subjective/Objective:    Pt admitted with CVA. She is from home with her son that provides her care.                Action/Plan: SNF per PT/OT. CM following for discharge disposition.   Expected Discharge Date:                  Expected Discharge Plan:  Skilled Nursing Facility  In-House Referral:  Clinical Social Work  Discharge planning Services  CM Consult  Post Acute Care Choice:    Choice offered to:     DME Arranged:    DME Agency:     HH Arranged:    HH Agency:     Status of Service:  In process, will continue to follow  If discussed at Long Length of Stay Meetings, dates discussed:    Additional Comments:  Kermit Balo, RN 12/30/2015, 10:30 AM

## 2015-12-30 NOTE — Evaluation (Signed)
Occupational Therapy Evaluation Patient Details Name: [name] MRN: [id number] DOB: [birth year] Today's Date: 12/30/2015    History of Present Illness pt is a 82 y/o female with h/o TIA, PVD with L BKA, CKD4, HTN CAD, dHF and DM admitted to ED with acute mental status changes with slurred speech aphasia, right sided weakness and facial droop.  MRI (+) L MCA watershed infarct   Clinical Impression   PT admitted with L MCA watershed infarct. Pt currently with functional limitiations due to the deficits listed below (see OT problem list). PTA was at home with total (A) for adls from family and (A) for transfers with incr need for (A) due to deficits.  Pt will benefit from skilled OT to increase their independence and safety with adls and balance to allow discharge SNF with palliative consult. Pt will require 24/7 care and incr aid (A) if to d/c home with family.     Follow Up Recommendations  SNF;Other (comment) (vs Palliative recommendations)    Equipment Recommendations  Wheelchair (measurements OT);Wheelchair cushion (measurements OT);Hospital bed;Other (comment) Michiel Sites lift)    Recommendations for Other Services Other (comment) (Palliative consult)     Precautions / Restrictions Precautions Precautions: Fall      Mobility Bed Mobility Overal bed mobility: Needs Assistance;+2 for physical assistance Bed Mobility: Supine to Sit;Sit to Supine;Rolling Rolling: Total assist Sidelying to sit: Total assist;+2 for physical assistance Supine to sit: Total assist;+2 for physical assistance Sit to supine: Total assist;+2 for physical assistance   General bed mobility comments: pt with not initation. pt total (A) for all aspects. Pt opening eyes with static sitting. pt with eyes closed after several minutes EOB  Transfers                 General transfer comment: not tested    Balance     Sitting balance-Leahy Scale: Zero                                       ADL Overall ADL's : Needs assistance/impaired                                       General ADL Comments: total (A) for all adls     Vision Vision Assessment?: Vision impaired- to be further tested in functional context Additional Comments: L preferred gaZe at this time   Perception     Praxis      Pertinent Vitals/Pain Pain Assessment: No/denies pain Faces Pain Scale: No hurt     Hand Dominance Right   Extremity/Trunk Assessment Upper Extremity Assessment Upper Extremity Assessment: RUE deficits/detail;LUE deficits/detail RUE Deficits / Details: edema noted at the hand, tone present son reports baseline shoulder injury and deficits. Pt elbow ROM only. tone present. Pt noted to have AROM with elbow flexion x2 spontaneous movements at EOB. PT with no active movement at finger tips RUE Coordination: decreased fine motor;decreased gross motor LUE Deficits / Details: AROM noted but not functionally attempting to use L hand. son reports that at baseline pt uses L hand for ~5-10 seconds at EOB for support   Lower Extremity Assessment Lower Extremity Assessment: Defer to PT evaluation;LLE deficits/detail LLE Deficits / Details: BKA   Cervical / Trunk Assessment Cervical / Trunk Assessment: Kyphotic   Communication Communication Communication: Other (comment) (not  verbal at this time)   Cognition Arousal/Alertness: Lethargic Behavior During Therapy: Flat affect Overall Cognitive Status: Difficult to assess                     General Comments       Exercises       Shoulder Instructions      Home Living Family/patient expects to be discharged to:: Private residence Living Arrangements: Children Available Help at Discharge: Family;Available 24 hours/day Type of Home: House Home Access: Ramped entrance     Home Layout: One level     Bathroom Shower/Tub: Chief Strategy OfficerTub/shower unit   Bathroom Toilet: Standard Bathroom Accessibility: No    Home Equipment: Environmental consultantWalker - 2 wheels;Bedside commode;Tub bench;Hospital bed;Wheelchair - manual          Prior Functioning/Environment Level of Independence: Needs assistance  Gait / Transfers Assistance Needed: pt has had progressive decline to the point she is a max-total assist for pivot transfers only and OOB ADL's / Homemaking Assistance Needed: son bathes and dresses her as well as all the housework            OT Problem List: Decreased strength;Decreased activity tolerance;Impaired balance (sitting and/or standing);Decreased cognition;Decreased safety awareness;Decreased knowledge of use of DME or AE;Cardiopulmonary status limiting activity;Decreased knowledge of precautions   OT Treatment/Interventions: Self-care/ADL training;Therapeutic exercise;Neuromuscular education;DME and/or AE instruction;Therapeutic activities;Cognitive remediation/compensation;Visual/perceptual remediation/compensation;Patient/family education;Balance training    OT Goals(Current goals can be found in the care plan section) Acute Rehab OT Goals Patient Stated Goal: none stated by patient. Son states "i know yall have to work with her to get her doing more" OT Goal Formulation: With family Time For Goal Achievement: 01/13/16 Potential to Achieve Goals: Poor  OT Frequency: Min 1X/week   Barriers to D/C:            Co-evaluation              End of Session Nurse Communication: Mobility status;Precautions  Activity Tolerance: Patient tolerated treatment well Patient left: in bed;with bed alarm set;with call bell/phone within reach;with family/visitor present   Time: [id number] OT Time Calculation (min): 10 min Charges:  OT General Charges $OT Visit: 1 Procedure OT Evaluation $OT Eval High Complexity: 1 Procedure G-Codes:    [name], [name] 12/30/2015, 10:26 AM  [name], [name]   OTR/L Pager: [phone]: [phone] Office: [phone] .

## 2015-12-30 NOTE — Progress Notes (Signed)
Initial Nutrition Assessment   INTERVENTION:  Monitor goals of care (PO vs TF needs)   NUTRITION DIAGNOSIS:   Predicted suboptimal nutrient intake related to inability to eat, dysphagia, lethargy/confusion as evidenced by NPO status.   GOAL:   Patient will meet greater than or equal to 90% of their needs   MONITOR:   Diet advancement, PO intake, Labs, Weight trends, Skin  REASON FOR ASSESSMENT:   Low Braden    ASSESSMENT:   82 y.o. female with history of TIA, Left BKA, PVD, CKD IV, HTN, HLD, CAD, chronic diastolic heart failure and DM  presenting with acute mental status changes including slurred speech, aphasia, decreased awareness, right-sided weakness and right facial droop.  Pt was assessed by SLP this morning and recommended pt remain NPO. Per MD note, prognosis is poor. Pt sleeping at time of visit. History obtained from pt's son at bedside who reports that patient remains confused and aphasic. He states that patient was eating well/normally PTA and maintaining her weight. Son has tried giving patient Ensure and Glucerna nutritional supplements in the past, but she complained that they were too sweet.   Labs: glucose ranging 109 to 170 mg/dL. Hemoglobin A1c 6.9%, high cholesterol, high triglycerides, low hemoglobin  Diet Order:  Diet NPO time specified  Skin:  Reviewed, no issues  Last BM:  PTA  Height:   Ht Readings from Last 1 Encounters:  12/30/15 5' (1.524 m)    Weight:   Wt Readings from Last 1 Encounters:  12/30/15 121 lb (54.9 kg)    Ideal Body Weight:     BMI:  Body mass index is 23.63 kg/m.  Estimated Nutritional Needs:   Kcal:  1200-1400  Protein:  60-70 grams  Fluid:  1.4 L/day  EDUCATION NEEDS:   No education needs identified at this time  Dorothea Ogle RD, CSP, LDN Inpatient Clinical Dietitian Pager: [phone] After Hours Pager: [phone]

## 2015-12-30 NOTE — Evaluation (Signed)
Clinical/Bedside Swallow Evaluation Patient Details  Name: [name] MRN: [id number] Date of Birth: 1924-02-11  Today's Date: 12/30/2015 Time: SLP Start Time (ACUTE ONLY): 0746 SLP Stop Time (ACUTE ONLY): 0805 SLP Time Calculation (min) (ACUTE ONLY): 19 min  Past Medical History:  Past Medical History:  Diagnosis Date  . Abnormal nuclear stress test 06/01/09   Demonstrated a new area of infarct scar, peri-infarct ischemia seen in the inferolateral territory. EF eas normal at 70% with mild hypocontractility at the apex, distal inferolateral wall.  . Anemia   . Arthritis   . Cardiomyopathy, idiopathic (HCC) 02/08/2011  . Chronic diastolic CHF (congestive heart failure) (HCC)    Takes Lasix  . Chronic kidney disease (CKD), stage IV (severe) (HCC)   . Coronary artery disease    a. Cath 09/2010 - med rx.  . Diabetes mellitus    type 2 NIDDM  . Dyslipidemia   . GERD (gastroesophageal reflux disease)   . Gout    takes allopurinol  . H/O echocardiogram 09/06/11   Indication- nonIschemic Cardiomyopathy. EF = now greater than 55% with no regional wall motion abnormalities. Tthere is mild to moderate trisuspid regurgitayion and mild pulmonary hypertension with an RVSP of 35 mmHg as well as stage 1 diastolic dysfunction and mild to moderate LVH.  Marland Kitchen Hx of transient ischemic attack (TIA)   . Hypertension   . Hypothyroidism    (SEVERE) Takes Levothryroxine  . Irregular heartbeat   . Memory loss   . Myocardial infarct    x 3 unsure of years  . Nonischemic cardiomyopathy (HCC)    EF now is 55%, reduced due to myxedema, which is improved.  . Peripheral neuropathy (HCC)   . Peripheral vascular disease (HCC)    a. s/p L BKA.  . Shingles   . Stroke (HCC)   . TIA (transient ischemic attack)   . Ulcer [facility])    Past Surgical History:  Past Surgical History:  Procedure Laterality Date  . AMPUTATION  07/05/2011   Procedure: AMPUTATION BELOW KNEE;  Surgeon: Chuck Hint, MD;   Location: [facility] OR;  Service: Vascular;  Laterality: Left;  . ANGIOPLASTY  1988  . AV FISTULA PLACEMENT    . AV FISTULA PLACEMENT Right 12/24/2013   Procedure: INSERTION OF ARTERIOVENOUS (AV) GORE-TEX GRAFT ARM;  Surgeon: Chuck Hint, MD;  Location: [facility] OR;  Service: Vascular;  Laterality: Right;  . BACK SURGERY     [facility]  . CARDIAC CATHETERIZATION  09/28/07   Demonstrated multiple sequential lesions around 40 to 30% in the RCA territory.  . Duplex doppler  05/10/11   LE arterial dopplers demonstrate bilaterally reduced ABIs of 0.91 on right & 0.56 on left. She does report some decreased pain on the left, & there's moderate mixed-density plaque in the right SFA w/50 to 69% reduction. There's a 69% reduction in the left SFA & does appear to be occlusive disease of left posterior tibial artery. Right posterior dorsalis pedis artery demonstrates occlusive disease  . ESOPHAGOGASTRODUODENOSCOPY N/A 04/15/2014   Procedure: ESOPHAGOGASTRODUODENOSCOPY (EGD);  Surgeon: Rachael Fee, MD;  Location[facility] ENDOSCOPY;  Service: Endoscopy;  Laterality: N/A;  . EYE SURGERY     Left eye surgery; cataract removal  . INSERTION OF DIALYSIS CATHETER N/A 01/06/2013   Procedure: INSERTION OF DIALYSIS CATHETER;  Surgeon: Larina Earthly, MD;  Location: [facility] OR;  Service: Vascular;  Laterality: N/A;  . left bka  07/05/2011  . left lower extremity venous duplex Left 06/27/11  Summary: No evidence of DVT involving the left lower extremity and right common femoral vein.   Marland Kitchen. LIGATION ARTERIOVENOUS GORTEX GRAFT Right 03/25/2014   Procedure: LIGATION ARTERIOVENOUS GORTEX GRAFT;  Surgeon: Chuck Hinthristopher S Dickson, MD;  [facility];  Service: Vascular;  Laterality: Right;  . LOWER EXTREMITY ANGIOGRAM N/A 10/31/2011   Procedure: LOWER EXTREMITY ANGIOGRAM;  Surgeon: Chuck Hinthristopher S Dickson, MD;  [facility];  Service: Cardiovascular;  Laterality: N/A;  . Lower extremity arterial evaluation  06/27/11   SUMMARY:  Right: ABI not ascertained due to false elevation in BP secondary to calcification (posterior tibial artery is non compressible). Left: ABI indicates moderate reduction in arterial flow. Bilateral: Great toe PPG waveforms indicate adequate perfusion. Great toe pressures not obtained due to patient's movements secondary to pain.  Marland Kitchen. SHUNTOGRAM N/A 03/17/2014   Procedure: Betsey AmenSHUNTOGRAM;  Surgeon: Fransisco HertzBrian L Chen, MD;  [facility];  Service: Cardiovascular;  Laterality: N/A;   HPI:  82 [facility] right sided weakness, speech deficits, right facial droop.  Found to have left MCA/PCA CVA.  Pt with right inattention, dysphagia, aphasia.  She has h/o esophageal deficits - including dysmotility and diverticulum 11/09/2015.     Assessment / Plan / Recommendation Clinical Impression  Pt not appropriate for po intake due to lethargy, cognitive linguistic deficits and gross dysphagia.  Suspect pt is aspirating secretions at this time, characterized by intermittent wet moaning.  Oral care provided via oral suction with pt demonstrating some resistance *note appearance of abrasion on left lateral tongue.  SLP suctioned water from oral cavity after excessive oral holding, pt with sneezing (x5) and delayed cough with appearance of wretching - RN arrived and listened to lung sounds and pt's oxygen saturation was 98 and stable after event.  Educated son to recommendation to continue npo and oral care with pt sitting upright as much as possible.  Recommend to consider palliative consult for this pt who was completely dependent prior to admission.  Educated pt's son to findings/recommendations and importance of oral care using teach back.  SLP to folow up.     Aspiration Risk  Severe aspiration risk;Risk for inadequate nutrition/hydration (? concern for hydration adequacy given pt npo)    Diet Recommendation NPO        Other  Recommendations Oral Care Recommendations: Oral care QID   Follow up  Recommendations        Frequency and Duration min 2x/week  2 weeks       Prognosis Prognosis for Safe Diet Advancement: Guarded Barriers to Reach Goals: Severity of deficits;Cognitive deficits;Language deficits      Swallow Study   General Date of Onset: 12/30/15 HPI: 82 [facility] right sided weakness, speech deficits, right facial droop.  Found to have left MCA/PCA CVA.  Pt with right inattention, dysphagia, aphasia.  She has h/o esophageal deficits - including dysmotility and diverticulum 11/09/2015.   Type of Study: Bedside Swallow Evaluation Diet Prior to this Study: NPO Temperature Spikes Noted: Yes Respiratory Status: Room air History of Recent Intubation: No Behavior/Cognition: Lethargic/Drowsy;Doesn't follow directions Oral Cavity Assessment: Lesions (appearance of abrasion on left lateral tongue) Oral Care Completed by SLP: Yes Oral Cavity - Dentition: Edentulous Self-Feeding Abilities: Total assist;Refused PO (refused oral care) Patient Positioning: Upright in bed Baseline Vocal Quality:  (pt only groaning, no attempts at phonation) Volitional Cough: Cognitively unable to elicit Volitional Swallow: Unable to elicit (reflexively triggered, audible swallow with pt wincing occasionally)  Oral/Motor/Sensory Function Overall Oral Motor/Sensory Function: Other (comment) (pt did not follow commands for OME but decreased right facial/labial strength and asymmetry noted)   Ice Chips Ice chips: Not tested   Thin Liquid Thin Liquid: Impaired Presentation:  (1/10 tsp water ) Oral Phase Impairments: Other (comment);Poor awareness of bolus (pt sealed lips tightly, to avoid oral care and intake) Oral Phase Functional Implications: Oral holding Other Comments: SLP suctioned water from oral cavity after excessive oral holding, pt with sneezing (x5) and delayed cough with appearance of wretching - RN arrived and listened to lung sounds and pt's oxygen saturation was 98  and stable after event    Nectar Thick Nectar Thick Liquid: Not tested   Honey Thick Honey Thick Liquid: Not tested   Puree Puree: Not tested   Solid   GO   Solid: Not tested        Mills Koller, MS [facility] SLP [phone]

## 2015-12-30 NOTE — Progress Notes (Signed)
STROKE TEAM PROGRESS NOTE   HISTORY OF PRESENT ILLNESS (per record) This is a 82 year old woman who is brought into the emergency department by EMS because of "acute decreased responsiveness and weakness on her right side." Code stroke was activated as a result. Dr. Roxy Mannsster evaluated the patient in the emergency department. She appeared uncomfortable, rolling from side to side on the gurney and at times holding her head with her hand. She would attend to him and would answer simple questions with yes, no, and I don't know. She would attempt to say other things but her speech is slurred and difficult to understand. She is moving all extremities spontaneously with what appears to be equal strength. There is some question of possible right-sided neglect and possible right field cut. NIH stroke scale score was 8. She was taken for an emergent CT scan of the head which did not show any obvious acute abnormality. Over the course of her time in the emergency department, she began to attend to her right side better. She also began to speak more and although her speech remains slurred it became more intelligible. It was unclear what her precise functional baseline is, as on reviewing her chart she has history of memory loss and failure to thrive. Given that her exam was suggestive of encephalopathy with unclear baseline, the decision was made not to proceed with thrombolytic therapy.  Her son arrived in the emergency department later. When I spoke with him, he states that she was normal at about 9:00 tonight when she suddenly seemed to become more confused and unable to remember things. He says that he has had 2 strokes and her symptoms are similar to those that he experienced with his strokes. This is what prompted him to call 911. When I asked about her baseline, he states that she is "on point," and that she is normally able to speak and dresses herself. Her last known well was 12/29/2015 at 2100. Patient was not  administered IV t-PA secondary to uncertainty regarding diagnosis, exam inconsistent. She was admitted for further evaluation and treatment.   SUBJECTIVE (INTERVAL HISTORY) Son at bedside. Pt still global aphasia with right hemiplegia. Poor prognosis. Discussed with son that pt may need nursing home placement. Baseline wheelchair bound at home.    OBJECTIVE Temp:  [98.1 F (36.7 C)-100.5 F (38.1 C)] 99.2 F (37.3 C) (10/11 0532) Pulse Rate:  [88-109] 107 (10/11 16100638) Cardiac Rhythm: Normal sinus rhythm (10/11 0700) Resp:  [18-20] 18 (10/11 0532) BP: (146-193)/(62-116) 168/62 (10/11 0638) SpO2:  [96 %-100 %] 96 % (10/11 0532)  CBC:  Recent Labs Lab 12/28/15 2226 12/28/15 2231  WBC 6.4  --   NEUTROABS 3.3  --   HGB 10.8* 12.2  HCT 35.1* 36.0  MCV 90.2  --   PLT 299  --     Basic Metabolic Panel:  Recent Labs Lab 12/28/15 2226 12/28/15 2231  NA 139 139  K 3.4* 3.4*  CL 103 104  CO2 22  --   GLUCOSE 161* 156*  BUN 11 16  CREATININE 2.12* 2.00*  CALCIUM 8.9  --     Lipid Panel:    Component Value Date/Time   CHOL 301 (H) 12/29/2015 0305   TRIG 205 (H) 12/29/2015 0305   HDL 72 12/29/2015 0305   CHOLHDL 4.2 12/29/2015 0305   VLDL 41 (H) 12/29/2015 0305   LDLCALC 188 (H) 12/29/2015 0305   HgbA1c:  Lab Results  Component Value Date   HGBA1C 6.9 (H)  12/29/2015   Urine Drug Screen: No results found for: LABOPIA, COCAINSCRNUR, LABBENZ, AMPHETMU, THCU, LABBARB    IMAGING I have personally reviewed the radiological images below and agree with the radiology interpretations.  Dg Chest 2 View 12/29/2015 No active cardiopulmonary disease.   Ct Head Wo Contrast 12/28/2015 1. No acute intracranial pathology seen on CT. 2. Increased diffuse prominence of the supratentorial ventricles, with suggestion of underlying transependymal resorption of CSF, concerning for mild hydrocephalus. 3. Underlying mild cortical volume loss and scattered small vessel ischemic  microangiopathy noted.   Mr Brain Wo Contrast 12/29/2015 1. Acute posterior left MCA or MCA/PCA watershed infarct with no associated hemorrhage or mass effect. 2. Progressed and widespread cerebral white matter signal abnormality since 2006, favor chronic small vessel disease related. Chronic left thalamic lacunar infarct. 3.  The examination had to be discontinued prior to completion.   EEG 12/29/2015 This awake EEG is abnormal due to diffuse slowing of the waking background. Clinical Correlation of the above findings indicates diffuse cerebral dysfunction that is non-specific in etiology and can be seen with hypoxic/ischemic injury, toxic/metabolic encephalopathies, neurodegenerative disorders, or medication effect.  The absence of epileptiform discharges does not rule out a clinical diagnosis of epilepsy.  Clinical correlation is advised.   TTE pending   PHYSICAL EXAM  Temp:  [98.1 F (36.7 C)-100.5 F (38.1 C)] 99.2 F (37.3 C) (10/11 0532) Pulse Rate:  [88-109] 107 (10/11 0638) Resp:  [18-20] 18 (10/11 0532) BP: (146-193)/(62-116) 168/62 (10/11 0638) SpO2:  [96 %-100 %] 96 % (10/11 0532)  General - Well nourished, well developed, global aphasia and not following commands.  Ophthalmologic - Fundi not visualized due to noncooperation.  Cardiovascular - Regular rate and rhythm.  Neuro - awake alert but global aphasia, not following commands, have sounds out intermittently but not words. Right side neglect, not blinking to visual threat on the right. Left gaze preference. PERRL. Right facial droop, tongue not able to exam due to non cooperation. LUE spontaneous movement against gravity, LLE BKA and withdraw to pain. LUE and LLE extension on pain stimulation. DTR 1+ and right positive babinski. Sensation, gait and coordination not tested.    ASSESSMENT/PLAN [name] is a 82 y.o. female with history of TIA, Left BKA, PVD, CKD IV, HTN, HLD, CAD, chronic diastolic heart  failure and DM  presenting with acute mental status changes including slurred speech, aphasia, decreased awareness, right-sided weakness and right facial droop. She did not receive IV t-PA due to unclear stroke diagnosis and inconsistent exam.   Stroke:  Dominant  posterior L MCA and MCA/PCA watershed infarct, etiology unclear. Likely large vessel disease due to multiple stroke risk factors including PVD s/p amputation, HTN, HLD, CAD/MI, DM, CHF, however, potential afib not excluded given CAD/MI, CHF and advanced age.   Resultant  Global aphasia and right hemiplegia and right neglect  MRI  posterior L MCA and MCA/PCA watershed infarct. small vessel disease. Old L thalamic lacune  MRA  Cancelled as pt not cooperative.   Carotid Doppler  pending  2D Echo  pending  EEG diffuse slowing and no seizure  LDL 188  HgbA1c 6.9  lovenox for VTE prophylaxis  Diet NPO time specified. NPO per ST.  aspirin 81 mg daily and clopidogrel 75 mg daily prior to admission, now on aspirin 300 mg suppository daily.   Ongoing aggressive stroke risk factor management  Therapy recommendations:  SNF vs home with 24h care  Disposition:  pending (at home with full  care PTA)  Due to her premorbid functional status at home, as well as advanced age with severe neuro deficit (global aphasia and right neglect with right hemiplegia), will not recommend further embolic work up at this time. May consider in the future if pt condition improves. However, at this time, palliative care consult is reasonable.   Hypertension  Stable Permissive hypertension (OK if < 180/105) but gradually normalize in 5-7 days Long-term BP goal normotensive  Hyperlipidemia  Home meds:  pravachol 10  Resume statin in hospital once able to swallow, consider change to lipitor  LDL 188, goal < 70  Continue statin at discharge  Diabetes, type II  HgbA1c 6.9, goal < 7.0   SSI  Other Stroke Risk Factors  Advanced age  Former  Cigarette smoker  Hx TIA  CAD/MI  PVD s/p left LE BKA  Other Active Problems  Chronic renal insufficiency, stage IV   Hypothyroidism  Hypokalemia kalemia  Hospital day # 1  Marvel Plan, MD PhD Stroke Neurology 12/30/2015 10:56 AM

## 2015-12-30 NOTE — Progress Notes (Addendum)
Patient ID: [name], female   DOB: [birth year], 82 y.o.   MRN: [id number]  PROGRESS NOTE    [name]  DJT:[id number] DOB: [birth year] DOA: 12/28/2015  PCP: Georgianne Fick, MD   Brief Narrative:  82 y.o.woman with a history of prior TIA, Left BKA, PVD, CKD IV (she is not a dialysis patient), HTN, HLD, CAD, chronic diastolic heart failure, and DM who presented to [facility] ED with worsening mental status changes including slurred speech, aphasia, right sided weakness and right sided facial droop.  CODE STROKE was called upon arrival. Neurology has seen the patient in consultation . She was not felt to be a tPA candidate due to inconsistent physical exam findings and concerns for acute encephalopathy. Head CT was negative for acute process (though chronic changes were noted). Chest xray was negative for acute process.   Assessment & Plan:   Principal Problem:   Acute right-sided weakness / Acute left MCA CVA / Acute encephalopathy  - Continue aspirin daily - MRI brain showed acute posterior left MCA or MCA/PCA watershed infarct with no associated hemorrhage or mass effect. Progressed and widespread cerebral white matter signal abnormality since 2006, favor chronic small vessel disease. Chronic left thalamic lacunar infarct. - ECHO and carotid doppler are pending - A1c is 6.9 - LDL is 188, goal is less than 100. Pt not yet on statin, will start stain therapy once passes swallow screen - Appreciate neurology following  Active Problems:   CAD - moderate at cath July 2012 (medical Rx) - Continue aspirin daily     Hypothyroidism - Continue IV synthroid     Essential hypertension - Allow permissive hypertension - BP 168/62    Chronic renal insufficiency, stage IV  - Cr 1.9 at baseline - Cr on this admission 2, within baseline range     Diabetes mellitus type 2 with peripheral artery disease and diabetic nephropathy and long term insulin use (HCC) - Continue SSI for now    DVT prophylaxis: Lovenox subQ Code Status: full code  Family Communication: son at the bedside this am Disposition Plan: needs PT eval    Consultants:   Neurology  Nutrition  SLP  PT  Procedures:   EEG - diffuse cerebral dysfunction nonspecific   Antimicrobials:   None    Subjective: No overnight events.   Objective: Vitals:   12/29/15 2100 12/30/15 0117 12/30/15 0532 12/30/15 0638  BP: (!) 146/99 (!) 182/72 (!) 193/96 (!) 168/62  Pulse:  98 (!) 109 (!) 107  Resp:  18 18   Temp:  (!) 100.5 F (38.1 C) 99.2 F (37.3 C)   TempSrc:  Axillary Axillary   SpO2:  98% 96%    No intake or output data in the 24 hours ending 12/30/15 1219 There were no vitals filed for this visit.  Examination:  General exam: Appears calm and comfortable  Respiratory system: Clear to auscultation. Respiratory effort normal. Cardiovascular system: S1 & S2 heard, RRR. No pedal edema. Gastrointestinal system: Abdomen is nondistended, soft and nontender. No organomegaly or masses felt. Normal bowel sounds heard. Central nervous system: Lethargic, does not respond to verbal stimuli  Extremities: Palpable pulses, no edema, left BKA Skin: No rashes, lesions or ulcers Psychiatry: Unable to assess due to altered mental status   Data Reviewed: I have personally reviewed following labs and imaging studies  CBC:  Recent Labs Lab 12/28/15 2226 12/28/15 2231  WBC 6.4  --   NEUTROABS 3.3  --   HGB 10.8* 12.2  HCT 35.1* 36.0  MCV 90.2  --   PLT 299  --    Basic Metabolic Panel:  Recent Labs Lab 12/28/15 2226 12/28/15 2231  NA 139 139  K 3.4* 3.4*  CL 103 104  CO2 22  --   GLUCOSE 161* 156*  BUN 11 16  CREATININE 2.12* 2.00*  CALCIUM 8.9  --    GFR: CrCl cannot be calculated (Unknown ideal weight.). Liver Function Tests:  Recent Labs Lab 12/28/15 2226  AST 34  ALT 19  ALKPHOS 66  BILITOT 0.4  PROT 6.3*  ALBUMIN 3.3*   No results for input(s): LIPASE, AMYLASE  in the last 168 hours. No results for input(s): AMMONIA in the last 168 hours. Coagulation Profile:  Recent Labs Lab 12/28/15 2226  INR 1.14   Cardiac Enzymes: No results for input(s): CKTOTAL, CKMB, CKMBINDEX, TROPONINI in the last 168 hours. BNP (last 3 results) No results for input(s): PROBNP in the last 8760 hours. HbA1C:  Recent Labs  12/29/15 0305  HGBA1C 6.9*   CBG:  Recent Labs Lab 12/29/15 2025 12/30/15 0027 12/30/15 0406 12/30/15 1012 12/30/15 1206  GLUCAP 147* 123* 109* 170* 135*   Lipid Profile:  Recent Labs  12/29/15 0305  CHOL 301*  HDL 72  LDLCALC 188*  TRIG 205*  CHOLHDL 4.2   Thyroid Function Tests:  Recent Labs  12/29/15 0306  TSH 121.241*  FREET4 0.51*   Anemia Panel: No results for input(s): VITAMINB12, FOLATE, FERRITIN, TIBC, IRON, RETICCTPCT in the last 72 hours. Urine analysis:    Component Value Date/Time   COLORURINE YELLOW 11/08/2015 1451   APPEARANCEUR CLEAR 11/08/2015 1451   LABSPEC 1.019 11/08/2015 1451   PHURINE 6.0 11/08/2015 1451   GLUCOSEU 100 (A) 11/08/2015 1451   HGBUR NEGATIVE 11/08/2015 1451   BILIRUBINUR SMALL (A) 11/08/2015 1451   KETONESUR NEGATIVE 11/08/2015 1451   PROTEINUR >300 (A) 11/08/2015 1451   UROBILINOGEN 0.2 08/18/2013 1807   NITRITE NEGATIVE 11/08/2015 1451   LEUKOCYTESUR NEGATIVE 11/08/2015 1451   Sepsis Labs: @LABRCNTIP (procalcitonin:4,lacticidven:4)   )No results found for this or any previous visit (from the past 240 hour(s)).    Radiology Studies: Dg Chest 2 View Result Date: 12/29/2015 No active cardiopulmonary disease. Electronically Signed   By: Mitzi Hansen M.D.   On: 12/29/2015 01:03   Ct Head Wo Contrast Result Date: 12/28/2015  1. No acute intracranial pathology seen on CT. 2. Increased diffuse prominence of the supratentorial ventricles, with suggestion of underlying transependymal resorption of CSF, concerning for mild hydrocephalus. 3. Underlying mild  cortical volume loss and scattered small vessel ischemic microangiopathy noted. These results were called by telephone at the time of interpretation on 12/28/2015 at 10:46 pm to Dr. Roxy Manns, who verbally acknowledged these results. Electronically Signed   By: Roanna Raider M.D.   On: 12/28/2015 22:49   Mr Brain Wo Contrast Result Date: 12/29/2015 1. Acute posterior left MCA or MCA/PCA watershed infarct with no associated hemorrhage or mass effect. 2. Progressed and widespread cerebral white matter signal abnormality since 2006, favor chronic small vessel disease related. Chronic left thalamic lacunar infarct. 3.  The examination had to be discontinued prior to completion. Electronically Signed   By: Odessa Fleming M.D.   On: 12/29/2015 11:26      Scheduled Meds: . aspirin  300 mg Rectal Daily   Or  . aspirin  325 mg Oral Daily  . chlorhexidine  15 mL Mouth Rinse BID  . enoxaparin (LOVENOX) injection  40 mg  Subcutaneous Q24H  . famotidine (PEPCID) IV  20 mg Intravenous Q12H  . insulin aspart  0-9 Units Subcutaneous Q4H  . levothyroxine  50 mcg Intravenous Daily  . mouth rinse  15 mL Mouth Rinse q12n4p   Continuous Infusions: . sodium chloride       LOS: 1 day    Time spent: 25 minutes  Greater than 50% of the time spent on counseling and coordinating the care.   Manson PasseyEVINE, Tresean Mattix, MD [facility] Pager [phone]  If 7PM-7AM, please contact night-coverage [email or web address] Password [email or web address] 12/30/2015, 12:19 PM

## 2015-12-31 ENCOUNTER — Inpatient Hospital Stay (HOSPITAL_COMMUNITY): Payer: Medicare Other

## 2015-12-31 ENCOUNTER — Other Ambulatory Visit (HOSPITAL_COMMUNITY): Payer: Medicare Other

## 2015-12-31 DIAGNOSIS — R414 Neurologic neglect syndrome: Secondary | ICD-10-CM

## 2015-12-31 DIAGNOSIS — R4701 Aphasia: Secondary | ICD-10-CM

## 2015-12-31 LAB — BASIC METABOLIC PANEL
Anion gap: 12 (ref 5–15)
BUN: 13 mg/dL (ref 6–20)
CHLORIDE: 114 mmol/L — AB (ref 101–111)
CO2: 16 mmol/L — AB (ref 22–32)
CREATININE: 2.07 mg/dL — AB (ref 0.44–1.00)
Calcium: 8.2 mg/dL — ABNORMAL LOW (ref 8.9–10.3)
GFR calc non Af Amer: 20 mL/min — ABNORMAL LOW (ref >60)
GFR, EST AFRICAN AMERICAN: 23 mL/min — AB (ref >60)
Glucose, Bld: 137 mg/dL — ABNORMAL HIGH (ref 65–99)
POTASSIUM: 4.2 mmol/L (ref 3.5–5.1)
SODIUM: 142 mmol/L (ref 135–145)

## 2015-12-31 LAB — GLUCOSE, CAPILLARY
GLUCOSE-CAPILLARY: 108 mg/dL — AB (ref 65–99)
GLUCOSE-CAPILLARY: 115 mg/dL — AB (ref 65–99)
GLUCOSE-CAPILLARY: 132 mg/dL — AB (ref 65–99)
GLUCOSE-CAPILLARY: 133 mg/dL — AB (ref 65–99)
Glucose-Capillary: 125 mg/dL — ABNORMAL HIGH (ref 65–99)
Glucose-Capillary: 106 mg/dL — ABNORMAL HIGH (ref 65–99)
Glucose-Capillary: 116 mg/dL — ABNORMAL HIGH (ref 65–99)

## 2015-12-31 MED ORDER — LEVALBUTEROL HCL 0.63 MG/3ML IN NEBU
0.6300 mg | INHALATION_SOLUTION | Freq: Four times a day (QID) | RESPIRATORY_TRACT | Status: DC | PRN
  Administered 2015-12-31 – 2016-01-02 (×2): 0.63 mg via RESPIRATORY_TRACT
  Filled 2015-12-31 (×3): qty 3

## 2015-12-31 MED ORDER — HYDRALAZINE HCL 20 MG/ML IJ SOLN
5.0000 mg | Freq: Four times a day (QID) | INTRAMUSCULAR | Status: DC | PRN
  Administered 2016-01-01 – 2016-01-05 (×7): 5 mg via INTRAVENOUS
  Filled 2015-12-31 (×8): qty 1

## 2015-12-31 NOTE — Progress Notes (Signed)
[facility]  Patient Status: Active (receiving services up to time of hospitalization)  [facility] is providing the following services: RN  If patient discharges after hours, please call [phone].   [name] 12/31/2015, 4:39 PM

## 2015-12-31 NOTE — Progress Notes (Signed)
Patient ID: [name], female   DOB: 03/16/1924, 82 y.o.   MRN: [id number]  PROGRESS NOTE    [name]  WUJ:[id number] DOB: 06/26/1923 DOA: 12/28/2015  PCP: Georgianne FickAMACHANDRAN,AJITH, MD   Brief Narrative:  82 y.o.woman with a history of prior TIA, Left BKA, PVD, CKD IV (she is not a dialysis patient), HTN, HLD, CAD, chronic diastolic heart failure, and DM who [facility] ED with worsening mental status changes including slurred speech, aphasia, right sided weakness and right sided facial droop.  [facility]OKE was called upon arrival. Neurology has seen the patient in consultation . She was not felt to be a tPA candidate due to inconsistent physical exam findings and concerns for acute encephalopathy. Head CT was negative for acute process (though chronic changes were noted). Chest xray was negative for acute process.   Assessment & Plan:   Principal Problem:   Acute right-sided weakness / Acute left MCA CVA / Acute encephalopathy  - Continue aspirin daily - MRI brain showed acute posterior left MCA or MCA/PCA watershed infarct with no associated hemorrhage or mass effect. Progressed and widespread cerebral white matter signal abnormality since 2006, favor chronic small vessel disease. Chronic left thalamic lacunar infarct. - Carotid doppler showed bilateral:  1-39% ICA stenosis. Unable to obtain vertebral artery images bilaterally due to constant movement of the head  - ECHO is pending - A1c is 6.9 - LDL is 188, goal is less than 100. Pt not yet on statin, will start stain therapy once passes swallow screen - Appreciate neurology following  Active Problems:   CAD - moderate at cath July 2012 (medical Rx) - Continue aspirin daily     Hypothyroidism - Continue IV synthroid     Hypokalemia - Supplemented and now WNL    Essential hypertension - Permissive hypertension - Added hydralazine 5 mg IV every 8 hours as needed for BP above 160/90     Chronic renal insufficiency,  stage IV  - Cr 1.9 at baseline - Cr on this admission 2, within baseline range     Diabetes mellitus type 2 with peripheral artery disease and diabetic nephropathy and long term insulin use (HCC) - Continue SSI for now  - CBG's in past 24 hours: 108, 133, 132   DVT prophylaxis: Lovenox subQ Code Status: full code  Family Communication: family at the bedside this am Disposition Plan: PCT for GOC   Consultants:   Neurology  Nutrition  SLP  PT  PCT   Procedures:   EEG - diffuse cerebral dysfunction nonspecific   Carotid doppler -  Bilateral:  1-39% ICA stenosis. Unable to obtain vertebral artery images bilaterally due to constant movement of the head   Antimicrobials:   None    Subjective: No overnight events.   Objective: Vitals:   12/31/15 0742 12/31/15 0851 12/31/15 1007 12/31/15 1015  BP:  (!) 161/96 (!) 182/102 (!) 192/112  Pulse:   (!) 116   Resp:   18   Temp:   98.9 F (37.2 C)   TempSrc:   Oral   SpO2: 99%  100%   Weight:      Height:       No intake or output data in the 24 hours ending 12/31/15 1119 Filed Weights   12/30/15 1321  Weight: 54.9 kg (121 lb)    Examination:  General exam: Appears calm and comfortable, no distress   Respiratory system: Clear to auscultation. No wheezing  Cardiovascular system: S1 & S2 heard,  Rate controlled  Gastrointestinal system: (+) BS, non tender  Central nervous system: very sleepy but briefly opens eyes to verbal stimuli  Extremities: Palpable pulses, no edema, left BKA Skin: warm, dry  Psychiatry: Unable to assess due to altered mental status   Data Reviewed: I have personally reviewed following labs and imaging studies  CBC:  Recent Labs Lab 12/28/15 2226 12/28/15 2231  WBC 6.4  --   NEUTROABS 3.3  --   HGB 10.8* 12.2  HCT 35.1* 36.0  MCV 90.2  --   PLT 299  --    Basic Metabolic Panel:  Recent Labs Lab 12/28/15 2226 12/28/15 2231 12/31/15 0319  NA 139 139 142  K 3.4* 3.4* 4.2    CL 103 104 114*  CO2 22  --  16*  GLUCOSE 161* 156* 137*  BUN 11 16 13   CREATININE 2.12* 2.00* 2.07*  CALCIUM 8.9  --  8.2*   GFR: Estimated Creatinine Clearance: 14.1 mL/min (by C-G formula based on SCr of 2.07 mg/dL (H)). Liver Function Tests:  Recent Labs Lab 12/28/15 2226  AST 34  ALT 19  ALKPHOS 66  BILITOT 0.4  PROT 6.3*  ALBUMIN 3.3*   No results for input(s): LIPASE, AMYLASE in the last 168 hours. No results for input(s): AMMONIA in the last 168 hours. Coagulation Profile:  Recent Labs Lab 12/28/15 2226  INR 1.14   Cardiac Enzymes: No results for input(s): CKTOTAL, CKMB, CKMBINDEX, TROPONINI in the last 168 hours. BNP (last 3 results) No results for input(s): PROBNP in the last 8760 hours. HbA1C:  Recent Labs  12/29/15 0305  HGBA1C 6.9*   CBG:  Recent Labs Lab 12/30/15 1649 12/30/15 2020 12/31/15 0043 12/31/15 0455 12/31/15 0823  GLUCAP 126* 146* 108* 133* 132*   Lipid Profile:  Recent Labs  12/29/15 0305  CHOL 301*  HDL 72  LDLCALC 188*  TRIG 205*  CHOLHDL 4.2   Thyroid Function Tests:  Recent Labs  12/29/15 0306  TSH 121.241*  FREET4 0.51*   Anemia Panel: No results for input(s): VITAMINB12, FOLATE, FERRITIN, TIBC, IRON, RETICCTPCT in the last 72 hours. Urine analysis:    Component Value Date/Time   COLORURINE YELLOW 11/08/2015 1451   APPEARANCEUR CLEAR 11/08/2015 1451   LABSPEC 1.019 11/08/2015 1451   PHURINE 6.0 11/08/2015 1451   GLUCOSEU 100 (A) 11/08/2015 1451   HGBUR NEGATIVE 11/08/2015 1451   BILIRUBINUR SMALL (A) 11/08/2015 1451   KETONESUR NEGATIVE 11/08/2015 1451   PROTEINUR >300 (A) 11/08/2015 1451   UROBILINOGEN 0.2 08/18/2013 1807   NITRITE NEGATIVE 11/08/2015 1451   LEUKOCYTESUR NEGATIVE 11/08/2015 1451   Sepsis Labs: @LABRCNTIP (procalcitonin:4,lacticidven:4)   )No results found for this or any previous visit (from the past 240 hour(s)).    Radiology Studies: Dg Chest 2 View Result Date:  12/29/2015 No active cardiopulmonary disease. Electronically Signed   By: Mitzi Hansen M.D.   On: 12/29/2015 01:03   Ct Head Wo Contrast Result Date: 12/28/2015  1. No acute intracranial pathology seen on CT. 2. Increased diffuse prominence of the supratentorial ventricles, with suggestion of underlying transependymal resorption of CSF, concerning for mild hydrocephalus. 3. Underlying mild cortical volume loss and scattered small vessel ischemic microangiopathy noted. These results were called by telephone at the time of interpretation on 12/28/2015 at 10:46 pm to Dr. Roxy Manns, who verbally acknowledged these results. Electronically Signed   By: Roanna Raider M.D.   On: 12/28/2015 22:49   Mr Brain Wo Contrast Result Date: 12/29/2015 1. Acute posterior  left MCA or MCA/PCA watershed infarct with no associated hemorrhage or mass effect. 2. Progressed and widespread cerebral white matter signal abnormality since 2006, favor chronic small vessel disease related. Chronic left thalamic lacunar infarct. 3.  The examination had to be discontinued prior to completion. Electronically Signed   By: Odessa Fleming M.D.   On: 12/29/2015 11:26      Scheduled Meds: . aspirin  300 mg Rectal Daily   Or  . aspirin  325 mg Oral Daily  . chlorhexidine  15 mL Mouth Rinse BID  . enoxaparin (LOVENOX) injection  40 mg Subcutaneous Q24H  . famotidine (PEPCID) IV  20 mg Intravenous Q12H  . insulin aspart  0-9 Units Subcutaneous Q4H  . levothyroxine  50 mcg Intravenous Daily  . mouth rinse  15 mL Mouth Rinse q12n4p   Continuous Infusions: . sodium chloride 75 mL/hr at 12/30/15 1351     LOS: 2 days    Time spent: 25 minutes  Greater than 50% of the time spent on counseling and coordinating the care.   Manson Passey, MD [facility] Pager [phone]  If 7PM-7AM, please contact night-coverage [email or web address] Password [email or web address] 12/31/2015, 11:19 AM

## 2015-12-31 NOTE — Progress Notes (Signed)
Patient's HR is in the 120's and low 130's. New onset of wheezing . O2 at 2L/min started. Currently O2Sat 100% at 2L/min. Hospitalist paged.

## 2015-12-31 NOTE — Progress Notes (Signed)
Physical Therapy Treatment Patient Details Name: Shannon SirenRoberta G [name] MRN: [id number] DOB: 06/30/1923 Today's Date: 12/31/2015    History of Present Illness pt is a 82 y/o female with h/o TIA, PVD with L BKA, CKD4, HTN CAD, dHF and DM admitted to ED with acute mental status changes with slurred speech aphasia, right sided weakness and facial droop.  MRI pending, first attempt failed.    PT Comments    Pt had eyes open 90% of time.  Noted some attempts to track initially, but then became more difficult to draw her attention and get purposeful reactions  Follow Up Recommendations  SNF     Equipment Recommendations  None recommended by PT    Recommendations for Other Services       Precautions / Restrictions Precautions Precautions: Fall    Mobility  Bed Mobility Overal bed mobility: Needs Assistance;+2 for physical assistance Bed Mobility: Rolling;Sidelying to Sit Rolling: Total assist Sidelying to sit: Total assist;+2 for physical assistance       General bed mobility comments: pt did not initiate any movement for roll or transition to sit  Transfers Overall transfer level: Needs assistance   Transfers: Squat Pivot Transfers     Squat pivot transfers: Total assist;+2 physical assistance        Ambulation/Gait             General Gait Details: not applicable   Stairs            Wheelchair Mobility    Modified Rankin (Stroke Patients Only) Modified Rankin (Stroke Patients Only) Pre-Morbid Rankin Score: Severe disability Modified Rankin: Severe disability     Balance Overall balance assessment: Needs assistance Sitting-balance support: Single extremity supported;Feet supported Sitting balance-Leahy Scale: Poor Sitting balance - Comments: sat EOB x>10 min with increased arousal; pt able to go 8-10 seconds swaying all directions in an expanded BOS.  Unable to stop close guarding.  Generally needing at least minimal assist                            Cognition Arousal/Alertness: Lethargic Behavior During Therapy: Flat affect Overall Cognitive Status: Difficult to assess                      Exercises      General Comments        Pertinent Vitals/Pain Pain Assessment: Faces Faces Pain Scale: Hurts little more Pain Location: vague Pain Descriptors / Indicators: Moaning;Grimacing Pain Intervention(s): Monitored during session;Repositioned    Home Living                      Prior Function            PT Goals (current goals can now be found in the care plan section) Acute Rehab PT Goals Patient Stated Goal: none stated by patient. Son states "i know yall have to work with her to get her doing more" PT Goal Formulation: Patient unable to participate in goal setting Time For Goal Achievement: 01/12/16 Potential to Achieve Goals: Fair Progress towards PT goals: Not progressing toward goals - comment (pt is minimally participative)    Frequency    Min 3X/week      PT Plan Current plan remains appropriate    Co-evaluation             End of Session   Activity Tolerance: Patient limited by fatigue Patient left: in chair;with call bell/phone  within reach;with chair alarm set     Time: 7494-4967 PT Time Calculation (min) (ACUTE ONLY): 34 min  Charges:  $Therapeutic Activity: 23-37 mins                    G Codes:      Adamary Savary, Eliseo Gum 12/31/2015, 12:34 PM 12/31/2015  [place] Bing, PT [phone]  (pager)

## 2015-12-31 NOTE — Progress Notes (Signed)
SLP Cancellation Note  Patient Details Name: Shannon QUAIN MRN: [id number] DOB: [birth year]   Cancelled treatment:       Reason Eval/Treat Not Completed: Patient's level of consciousness;Fatigue/lethargy limiting ability to participate   Mills Koller, MS [facility] SLP [phone]

## 2015-12-31 NOTE — Progress Notes (Signed)
STROKE TEAM PROGRESS NOTE   SUBJECTIVE (INTERVAL HISTORY) Pt is sitting in chair. Pt still global aphasia with right hemiplegia and increased tone on the right. BP on the high side, received hydralazine overnight.    OBJECTIVE Temp:  [97.9 F (36.6 C)-99.4 F (37.4 C)] 98.9 F (37.2 C) (10/12 1007) Pulse Rate:  [106-130] 116 (10/12 1007) Cardiac Rhythm: Sinus tachycardia (10/12 0700) Resp:  [16-22] 18 (10/12 1007) BP: (158-192)/(78-113) 160/85 (10/12 1126) SpO2:  [98 %-100 %] 100 % (10/12 1007) Weight:  [121 lb (54.9 kg)] 121 lb (54.9 kg) (10/11 1321)  CBC:   Recent Labs Lab 12/28/15 2226 12/28/15 2231  WBC 6.4  --   NEUTROABS 3.3  --   HGB 10.8* 12.2  HCT 35.1* 36.0  MCV 90.2  --   PLT 299  --     Basic Metabolic Panel:   Recent Labs Lab 12/28/15 2226 12/28/15 2231 12/31/15 0319  NA 139 139 142  K 3.4* 3.4* 4.2  CL 103 104 114*  CO2 22  --  16*  GLUCOSE 161* 156* 137*  BUN 11 16 13   CREATININE 2.12* 2.00* 2.07*  CALCIUM 8.9  --  8.2*    Lipid Panel:     Component Value Date/Time   CHOL 301 (H) 12/29/2015 0305   TRIG 205 (H) 12/29/2015 0305   HDL 72 12/29/2015 0305   CHOLHDL 4.2 12/29/2015 0305   VLDL 41 (H) 12/29/2015 0305   LDLCALC 188 (H) 12/29/2015 0305   HgbA1c:  Lab Results  Component Value Date   HGBA1C 6.9 (H) 12/29/2015   Urine Drug Screen: No results found for: LABOPIA, COCAINSCRNUR, LABBENZ, AMPHETMU, THCU, LABBARB    IMAGING I have personally reviewed the radiological images below and agree with the radiology interpretations.  Dg Chest 2 View 12/29/2015 No active cardiopulmonary disease.   Ct Head Wo Contrast 12/28/2015 1. No acute intracranial pathology seen on CT. 2. Increased diffuse prominence of the supratentorial ventricles, with suggestion of underlying transependymal resorption of CSF, concerning for mild hydrocephalus. 3. Underlying mild cortical volume loss and scattered small vessel ischemic microangiopathy noted.    Mr Brain Wo Contrast 12/29/2015 1. Acute posterior left MCA or MCA/PCA watershed infarct with no associated hemorrhage or mass effect. 2. Progressed and widespread cerebral white matter signal abnormality since 2006, favor chronic small vessel disease related. Chronic left thalamic lacunar infarct. 3.  The examination had to be discontinued prior to completion.   EEG 12/29/2015 This awake EEG is abnormal due to diffuse slowing of the waking background. Clinical Correlation of the above findings indicates diffuse cerebral dysfunction that is non-specific in etiology and can be seen with hypoxic/ischemic injury, toxic/metabolic encephalopathies, neurodegenerative disorders, or medication effect.  The absence of epileptiform discharges does not rule out a clinical diagnosis of epilepsy.  Clinical correlation is advised.   CUS - Bilateral: 1-39% ICA stenosis. Vertebral artery not able to check due to movement.   TTE pending   PHYSICAL EXAM  Temp:  [97.9 F (36.6 C)-99.4 F (37.4 C)] 98.9 F (37.2 C) (10/12 1007) Pulse Rate:  [106-130] 116 (10/12 1007) Resp:  [16-22] 18 (10/12 1007) BP: (158-192)/(78-113) 160/85 (10/12 1126) SpO2:  [98 %-100 %] 100 % (10/12 1007) Weight:  [121 lb (54.9 kg)] 121 lb (54.9 kg) (10/11 1321)  General - Well nourished, well developed, global aphasia and not following commands.  Ophthalmologic - Fundi not visualized due to noncooperation.  Cardiovascular - Regular rate and rhythm.  Neuro - sleepy, global aphasia,  not following commands. Right side neglect, not blinking to visual threat on the right. Left gaze preference, but able to cross midline now. PERRL. Right facial droop, tongue not able to exam due to non cooperation. LUE spontaneous movement against gravity, LLE BKA and withdraw to pain. LUE and LLE extension on pain stimulation with increased muscle tone on the right. DTR 1+ and right positive babinski. Sensation, gait and coordination not tested.     ASSESSMENT/PLAN [name] is a 82 y.o. female with history of TIA, Left BKA, PVD, CKD IV, HTN, HLD, CAD, chronic diastolic heart failure and DM  presenting with acute mental status changes including slurred speech, aphasia, decreased awareness, right-sided weakness and right facial droop. She did not receive IV t-PA due to unclear stroke diagnosis and inconsistent exam.   Stroke:  Dominant  posterior L MCA and MCA/PCA watershed infarct, etiology unclear. Likely large vessel disease due to multiple stroke risk factors including PVD s/p amputation, HTN, HLD, CAD/MI, DM, CHF, however, potential afib not excluded given CAD/MI, CHF and advanced age.   Resultant  Global aphasia and right hemiplegia and right neglect  MRI  posterior L MCA and MCA/PCA watershed infarct. small vessel disease. Old L thalamic lacune  MRA  Cancelled as pt not cooperative.   Carotid Doppler  unremarkable  2D Echo  pending  EEG diffuse slowing and no seizure  LDL 188  HgbA1c 6.9  lovenox for VTE prophylaxis  Diet NPO time specified. NPO per ST.  aspirin 81 mg daily and clopidogrel 75 mg daily prior to admission, now on aspirin 300 mg suppository daily.   Ongoing aggressive stroke risk factor management  Therapy recommendations:  SNF vs home with 24h care  Disposition:  pending (at home with full care PTA)  Due to her premorbid functional status at home, as well as advanced age with severe neuro deficit (global aphasia and right neglect with right hemiplegia), will not recommend further embolic work up at this time. May consider in the future if pt condition improves. However, at this time, palliative care consult is reasonable.  Hypertension  On the high side Permissive hypertension (OK if < 180/105) but gradually normalize in 5-7 days Long-term BP goal normotensive  Hyperlipidemia  Home meds:  pravachol 10  Resume statin in hospital once able to swallow, consider change to  lipitor  LDL 188, goal < 70  Continue statin at discharge  Diabetes, type II  HgbA1c 6.9, goal < 7.0   SSI  Dysphagia   Has not passed swallow yet  NPO so far  May need tube feeding  If family desire no tube feeding, palliative care needs to be called.  Other Stroke Risk Factors  Advanced age  Former Cigarette smoker  Hx TIA  CAD/MI  PVD s/p left LE BKA  Other Active Problems  Chronic renal insufficiency, stage IV   Hypothyroidism  Hypokalemia kalemia  Hospital day # 2   Neurology will sign off. Please call with questions. Neuro follow up depends on palliative care status. Thanks for the consult.   Marvel Plan, MD PhD Stroke Neurology 12/31/2015 12:07 PM

## 2016-01-01 ENCOUNTER — Inpatient Hospital Stay (HOSPITAL_COMMUNITY): Payer: Medicare Other

## 2016-01-01 DIAGNOSIS — Z7189 Other specified counseling: Secondary | ICD-10-CM

## 2016-01-01 DIAGNOSIS — E1169 Type 2 diabetes mellitus with other specified complication: Secondary | ICD-10-CM

## 2016-01-01 DIAGNOSIS — I69820 Aphasia following other cerebrovascular disease: Secondary | ICD-10-CM

## 2016-01-01 DIAGNOSIS — E785 Hyperlipidemia, unspecified: Secondary | ICD-10-CM

## 2016-01-01 DIAGNOSIS — Z515 Encounter for palliative care: Secondary | ICD-10-CM

## 2016-01-01 DIAGNOSIS — R4182 Altered mental status, unspecified: Secondary | ICD-10-CM

## 2016-01-01 DIAGNOSIS — I6789 Other cerebrovascular disease: Secondary | ICD-10-CM

## 2016-01-01 LAB — ECHOCARDIOGRAM COMPLETE
Height: 60 in
Weight: 1936 oz

## 2016-01-01 LAB — GLUCOSE, CAPILLARY
GLUCOSE-CAPILLARY: 116 mg/dL — AB (ref 65–99)
GLUCOSE-CAPILLARY: 115 mg/dL — AB (ref 65–99)
GLUCOSE-CAPILLARY: 123 mg/dL — AB (ref 65–99)
Glucose-Capillary: 122 mg/dL — ABNORMAL HIGH (ref 65–99)
Glucose-Capillary: 116 mg/dL — ABNORMAL HIGH (ref 65–99)

## 2016-01-01 MED ORDER — PRAVASTATIN SODIUM 40 MG PO TABS
40.0000 mg | ORAL_TABLET | Freq: Every evening | ORAL | Status: DC
  Administered 2016-01-05: 40 mg via ORAL
  Filled 2016-01-01 (×2): qty 1

## 2016-01-01 MED ORDER — BOOST / RESOURCE BREEZE PO LIQD
1.0000 | Freq: Two times a day (BID) | ORAL | Status: DC
  Administered 2016-01-02 – 2016-01-05 (×4): 1 via ORAL
  Filled 2016-01-01 (×11): qty 1

## 2016-01-01 MED ORDER — PERFLUTREN LIPID MICROSPHERE
1.0000 mL | INTRAVENOUS | Status: AC | PRN
  Administered 2016-01-01: 2 mL via INTRAVENOUS
  Filled 2016-01-01: qty 10

## 2016-01-01 NOTE — Progress Notes (Signed)
Patient ID: [name], female   DOB: 09-14-1923, 82 y.o.   MRN: [id number]  PROGRESS NOTE    [name]  VZC:[id number] DOB: 1923/08/18 DOA: 12/28/2015  PCP: Georgianne Fick, MD   Brief Narrative:  82 y.o.woman with a history of prior TIA, Left BKA, PVD, CKD IV (she is not a dialysis patient), HTN, HLD, CAD, chronic diastolic heart failure, and DM who presented to [facility] ED with worsening mental status changes including slurred speech, aphasia, right sided weakness and right sided facial droop.  [facility]OKE was called upon arrival. Neurology has seen the patient in consultation . She was not felt to be a tPA candidate due to inconsistent physical exam findings and concerns for acute encephalopathy. Head CT was negative for acute process (though chronic changes were noted). Chest xray was negative for acute process.   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 Progressed and widespread cerebral white matter signal abnormality since 2006, favor chronic small vessel disease. Chronic left thalamic lacunar infarct. - Carotid doppler showed bilateral:  1-39% ICA stenosis. Unable to obtain vertebral artery images bilaterally due to constant movement of the head  - EEG showed diffuse slowing, no seizures  - ECHO is pending - A1c is 6.9 - LDL is 188, goal is less than 100. Pt not yet on statin, will start stain therapy once  passes swallow screen - Appreciate neurology following - Per PT - SNF placement, appreciate SW assistance with discharge planning  - Palliative care for goals of care   Active Problems:   CAD - moderate at cath July 2012 (medical Rx) - Continue aspirin daily     Dyslipidemia associated with type 2 DM  - Continue statin therapy     Hypothyroidism - Continue IV synthroid     Hypokalemia - Supplemented and now WNL    Essential hypertension - Permissive hypertension - Added hydralazine 5 mg IV every 8 hours as needed for BP above 160/90     Chronic renal insufficiency, stage IV  - Cr 1.9 at baseline - Cr on this admission 2, within baseline range     Diabetes mellitus type 2 with peripheral artery disease and diabetic nephropathy and long term insulin use (HCC) - Continue SSI for now    DVT prophylaxis: Lovenox subQ Code Status: full code  Family Communication: family at the bedside this am Disposition Plan: to SNF once cleared by neurology    Consultants:   Neurology  Nutrition  SLP  PT  PCT   Procedures:   EEG - diffuse cerebral dysfunction nonspecific   Carotid doppler -  Bilateral:  1-39% ICA stenosis. Unable to obtain vertebral artery images bilaterally due to constant movement of the head   2 D ECHO 01/01/2016 - pending   Antimicrobials:   None    Subjective: No overnight events.   Objective: Vitals:   01/01/16 0045 01/01/16 0210 01/01/16 0455 01/01/16 1044  BP: (!) 178/105 (!) 160/80 (!) 159/83 (!) 166/99  Pulse: (!) 110  95 (!) 108 (!) 107  Resp: 20  20 20   Temp: 98.4 F (36.9 C)  98.1 F (36.7 C) 98.4 F (36.9 C)  TempSrc: Axillary  Axillary Axillary  SpO2: 100%  100% 100%  Weight:      Height:        Intake/Output Summary (Last 24 hours) at 01/01/16 1053 Last data filed at 12/31/15 1300  Gross per 24 hour  Intake                0 ml  Output                0 ml  Net                0 ml   Filed Weights   12/30/15 1321    Weight: 54.9 kg (121 lb)    Examination:  General exam: No distress   Respiratory system: No wheezing, no rhonchi  Cardiovascular system: S1 & S2 heard, Rate controlled  Gastrointestinal system: (+) BS, non tender, non distended  Central nervous system: little more alert this am  Extremities: Palpable pulses, no edema, left BKA Skin: warm, dry  Psychiatry: No agitation, no restless   Data Reviewed: I have personally reviewed following labs and imaging studies  CBC:  Recent Labs Lab 12/28/15 2226 12/28/15 2231  WBC 6.4  --   NEUTROABS 3.3  --   HGB 10.8* 12.2  HCT 35.1* 36.0  MCV 90.2  --   PLT 299  --    Basic Metabolic Panel:  Recent Labs Lab 12/28/15 2226 12/28/15 2231 12/31/15 0319  NA 139 139 142  K 3.4* 3.4* 4.2  CL 103 104 114*  CO2 22  --  16*  GLUCOSE 161* 156* 137*  BUN 11 16 13   CREATININE 2.12* 2.00* 2.07*  CALCIUM 8.9  --  8.2*   GFR: Estimated Creatinine Clearance: 14.1 mL/min (by C-G formula based on SCr of 2.07 mg/dL (H)). Liver Function Tests:  Recent Labs Lab 12/28/15 2226  AST 34  ALT 19  ALKPHOS 66  BILITOT 0.4  PROT 6.3*  ALBUMIN 3.3*   No results for input(s): LIPASE, AMYLASE in the last 168 hours. No results for input(s): AMMONIA in the last 168 hours. Coagulation Profile:  Recent Labs Lab 12/28/15 2226  INR 1.14   Cardiac Enzymes: No results for input(s): CKTOTAL, CKMB, CKMBINDEX, TROPONINI in the last 168 hours. BNP (last 3 results) No results for input(s): PROBNP in the last 8760 hours. HbA1C: No results for input(s): HGBA1C in the last 72 hours. CBG:  Recent Labs Lab 12/31/15 1648 12/31/15 1945 12/31/15 2310 01/01/16 0335 01/01/16 0751  GLUCAP 106* 116* 115* 122* 116*   Lipid Profile: No results for input(s): CHOL, HDL, LDLCALC, TRIG, CHOLHDL, LDLDIRECT in the last 72 hours. Thyroid Function Tests: No results for input(s): TSH, T4TOTAL, FREET4, T3FREE, THYROIDAB in the last 72 hours. Anemia Panel: No  results for input(s): VITAMINB12, FOLATE, FERRITIN, TIBC, IRON, RETICCTPCT in the last 72 hours. Urine analysis:    Component Value Date/Time   COLORURINE YELLOW 11/08/2015 1451   APPEARANCEUR CLEAR 11/08/2015 1451   LABSPEC 1.019 11/08/2015 1451   PHURINE 6.0 11/08/2015 1451   GLUCOSEU 100 (A) 11/08/2015 1451   HGBUR NEGATIVE 11/08/2015 1451   BILIRUBINUR SMALL (A) 11/08/2015 1451   KETONESUR NEGATIVE 11/08/2015 1451   PROTEINUR >300 (A) 11/08/2015 1451   UROBILINOGEN 0.2 08/18/2013 1807   NITRITE NEGATIVE 11/08/2015 1451   LEUKOCYTESUR NEGATIVE 11/08/2015 1451  Sepsis Labs: @LABRCNTIP (procalcitonin:4,lacticidven:4)   )No results found for this or any previous visit (from the past 240 hour(s)).    Radiology Studies: Dg Chest 2 View Result Date: 12/29/2015 No active cardiopulmonary disease.   Ct Head Wo Contrast Result Date: 12/28/2015  1. No acute intracranial pathology seen on CT. 2. Increased diffuse prominence of the supratentorial ventricles, with suggestion of underlying transependymal resorption of CSF, concerning for mild hydrocephalus. 3. Underlying mild cortical volume loss and scattered small vessel ischemic microangiopathy noted.   Mr Brain Wo Contrast Result Date: 12/29/2015 1. Acute posterior left MCA or MCA/PCA watershed infarct with no associated hemorrhage or mass effect. 2. Progressed and widespread cerebral white matter signal abnormality since 2006, favor chronic small vessel disease related. Chronic left thalamic lacunar infarct. 3.  The examination had to be discontinued prior to completion.   Scheduled Meds: . aspirin  300 mg Rectal Daily   Or  . aspirin  325 mg Oral Daily  . chlorhexidine  15 mL Mouth Rinse BID  . enoxaparin (LOVENOX) injection  40 mg Subcutaneous Q24H  . famotidine (PEPCID) IV  20 mg Intravenous Q12H  . insulin aspart  0-9 Units Subcutaneous Q4H  . levothyroxine  50 mcg Intravenous Daily  . mouth rinse  15 mL Mouth Rinse q12n4p     Continuous Infusions: . sodium chloride 75 mL/hr at 01/01/16 0608     LOS: 3 days    Time spent: 15 minutes  Greater than 50% of the time spent on counseling and coordinating the care.   Manson Passey, MD [facility] Pager [phone]  If 7PM-7AM, please contact night-coverage [email or web address] Password [email or web address] 01/01/2016, 10:53 AM

## 2016-01-01 NOTE — Care Management Important Message (Signed)
Important Message  Patient Details  Name: [name] MRN: [id number] Date of Birth: 05-04-1923   Medicare Important Message Given:  Yes    Ramani Riva Abena 01/01/2016, 10:21 AM

## 2016-01-01 NOTE — Progress Notes (Addendum)
  Echocardiogram 2D Echocardiogram with Definity has been performed.  Janalyn Harder 01/01/2016, 9:46 AM

## 2016-01-01 NOTE — Progress Notes (Signed)
Patient in bed with son at her bedside. Denies having any pain. Was given a bath this shift. Son asked when patient will have her swallow eval. Therapy department called, Speech therapist on the way.

## 2016-01-01 NOTE — Progress Notes (Signed)
Speech therapist in patient room conducting eval.

## 2016-01-01 NOTE — Progress Notes (Signed)
Speech Language Pathology Treatment: Dysphagia;Cognitive-Linquistic  Patient Details Name: [name] MRN: [id number] DOB: [birth year] Today's Date: 01/01/2016 Time: 6761-9509 SLP Time Calculation (min) (ACUTE ONLY): 40 min  Assessment / Plan / Recommendation Clinical Impression  Pt with increased alertness this session. Pt laughed and indicated that she wanted some ice chips. Pt with timely swallow initiation with trials of ice chips and thin liquids via straw. Pt with increased oral holding of puree trials which required suctioning. Pt inappropriate for further trials of puree but appears to be a candidate for clear liquid diet. Pt required Max A multimodal cues to follow 1 step basic directions and more than extra time to initiate answers to basic questions. With more than an extra amount of time, pt able to answer in 1 word utterances that were intelligible. Given pt's significant cognitive impairments, ST recommends full nursing supervision with PO intake.    HPI HPI: 82 yo female adm to [facility] with right sided weakness, speech deficits, right facial droop.  Found to have left MCA/PCA CVA.  Pt with right inattention, dysphagia, aphasia.  She has h/o esophageal deficits - including dysmotility and diverticulum 11/09/2015.        SLP Plan  Continue with current plan of care     Recommendations  Diet recommendations: Thin liquid (Clear liquid diet) Liquids provided via: Cup;Straw Medication Administration: Via alternative means Supervision: Full supervision/cueing for compensatory strategies;Staff to assist with self feeding Compensations: Minimize environmental distractions;Slow rate;Small sips/bites Postural Changes and/or Swallow Maneuvers: Seated upright 90 degrees                Oral Care Recommendations: Oral care QID Follow up Recommendations:  (TBA) Plan: Continue with current plan of care       GO              Reegan Mctighe B. Dreama Saa, M.S., CCC-SLP Speech-Language  Pathologist    Antoine Vandermeulen 01/01/2016, 11:50 AM

## 2016-01-01 NOTE — NC FL2 (Signed)
[place] MEDICAID FL2 LEVEL OF CARE SCREENING TOOL     IDENTIFICATION  Patient Name: [name] Birthdate: 12/12/1923 Sex: female Admission Date (Current Location): 12/28/2015  [facility] [place]   Facility and Address:  [facility] [place], [address], [place], [place] [address]      Provider Number: [id number]  Attending Physician Name and Address:  Alison MurrayAlma M Devine, MD  Relative Name and Phone Number:  Morehead,Veronica-Daughter - [phone];  Hill,Albert-Son - [phone] Bottom; Hill,Beverly-Daughter - [phone]      Current Level of Care: Hospital Recommended Level of Care: Skilled Nursing Facility Prior Approval Number:    Date Approved/Denied:   PASRR Number: [id number] [id number] (Eff. 07/12/11)  Discharge Plan: SNF    Current Diagnoses: Patient Active Problem List   Diagnosis Date Noted  . Altered mental status   . Goals of care, counseling/discussion   . Palliative care by specialist   . Aphasia following other cerebrovascular disease   . Hemi-neglect of right side   . Cerebral thrombosis with cerebral infarction 12/30/2015  . Cerebrovascular accident (CVA) involving left middle cerebral artery territory [facility](HCC)   . Altered mental state 12/29/2015  . Acute right-sided weakness 12/29/2015  . Dysphagia   . FTT (failure to thrive) in adult 11/08/2015  . Nausea & vomiting 11/08/2015  . Metabolic acidosis 11/08/2015  . Hypertensive heart disease with heart failure (HCC) 10/07/2015  . Junctional bradycardia 09/18/2015  . Atypical chest pain 06/23/2014  . Neck pain   . Food impaction of esophagus   . Epiglottitis 04/13/2014  . Wound disruption, post-op, skin 02/05/2014  . Pre-operative cardiovascular examination 12/13/2013  . Foot pain 09/04/2013  . CKD (chronic kidney disease), stage IV (HCC) 09/04/2013  . Uncontrolled hypertension 08/23/2013  . Malnutrition of moderate degree (HCC) 08/21/2013  . Chronic diastolic heart  failure (HCC) 72/53/664405/28/2015  . Anemia of chronic disease 08/15/2013  . Hyperkalemia 08/15/2013  . 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 Dyslipidemia 01/05/2013  . Chronic renal insufficiency, stage IV (severe)- HD started 01/05/13 01/05/2013  . Diabetes mellitus type 2 with peripheral artery disease (HCC) 01/05/2013  . Peripheral vascular disease (HCC) 10/19/2011  . CAD - moderate at cath July 2012 (medical Rx) 02/08/2011  . 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 Hypothyroid 02/08/2011    Orientation RESPIRATION BLADDER Height & Weight     Self, Place  O2 (2 Liters oxygen) Incontinent Weight: 121 lb (54.9 kg) (from previous encounter) Height:  5' (152.4 cm) (from previous encounter)  BEHAVIORAL SYMPTOMS/MOOD NEUROLOGICAL BOWEL NUTRITION STATUS      Continent Diet (Currently NPO. Speech recommends Thin Liquid-clear diet)  AMBULATORY STATUS COMMUNICATION OF NEEDS Skin   Total Care (Patient left BKA) Verbally (With speech therapy - "pt able to answer in 1 word utterances that were intelligible.) Normal                       Personal Care Assistance Level of Assistance  Bathing, Feeding, Dressing Bathing Assistance: Maximum assistance Feeding assistance: Maximum assistance Dressing Assistance: Maximum assistance     Functional Limitations Info  Sight, Hearing, Speech Sight Info: Impaired Hearing Info: Adequate Speech Info: Impaired    SPECIAL CARE FACTORS FREQUENCY  PT (By licensed PT), OT (By licensed OT), Speech therapy     PT Frequency: Evaluated 10/10 and a minimum of 3X per week therapy recommended OT Frequency: Evaluated 10/11 and a minimum of  1X per week therapy recommended     Speech Therapy Frequency: Evaluated 10/11      Contractures Contractures Info: Not present    Additional Factors Info  Code Status, Allergies, Insulin Sliding Scale Code Status Info: Full Allergies Info: Aspirin   Insulin  Sliding Scale Info: 0-9 Units every 4 hours       Current Medications (01/01/2016):  This is the current hospital active medication list Current Facility-Administered Medications  Medication Dose Route Frequency Provider Last Rate Last Dose  . 0.9 %  sodium chloride infusion   Intravenous Continuous Alison Murray, MD 75 mL/hr at 01/01/16 0608    . acetaminophen (TYLENOL) tablet 650 mg  650 mg Oral Q4H PRN Michael Litter, MD       Or  . acetaminophen (TYLENOL) suppository 650 mg  650 mg Rectal Q4H PRN Michael Litter, MD   650 mg at 12/30/15 0549  . aspirin suppository 300 mg  300 mg Rectal Daily Michael Litter, MD   300 mg at 01/01/16 0518   Or  . aspirin tablet 325 mg  325 mg Oral Daily Michael Litter, MD      . chlorhexidine (PERIDEX) 0.12 % solution 15 mL  15 mL Mouth Rinse BID Maye Hides, MD   15 mL at 01/01/16 0941  . enoxaparin (LOVENOX) injection 40 mg  40 mg Subcutaneous Q24H Marvel Plan, MD   40 mg at 01/01/16 1157  . famotidine (PEPCID) IVPB 20 mg premix  20 mg Intravenous Q12H Michael Litter, MD   20 mg at 01/01/16 0944  . hydrALAZINE (APRESOLINE) injection 5 mg  5 mg Intravenous Q6H PRN Alison Murray, MD   5 mg at 01/01/16 0053  . insulin aspart (novoLOG) injection 0-9 Units  0-9 Units Subcutaneous Q4H Michael Litter, MD   1 Units at 01/01/16 [phone]  . levalbuterol (XOPENEX) nebulizer solution 0.63 mg  0.63 mg Nebulization Q6H PRN Leda Gauze, NP   0.63 mg at 12/31/15 0740  . levothyroxine (SYNTHROID, LEVOTHROID) injection 50 mcg  50 mcg Intravenous Daily Michael Litter, MD   50 mcg at 01/01/16 0943  . MEDLINE mouth rinse  15 mL Mouth Rinse q12n4p Maye Hides, MD   15 mL at 01/01/16 1159  . pravastatin (PRAVACHOL) tablet 40 mg  40 mg Oral QPM Alison Murray, MD         Discharge Medications: Please see discharge summary for a list of discharge medications.  Relevant Imaging Results:  Relevant Lab Results:   Additional Information ss#[id number]  Cristobal Goldmann, LCSW

## 2016-01-01 NOTE — Care Management Note (Signed)
Case Management Note  Patient Details  Name: [name] MRN: [id number] Date of Birth: 26-Feb-1924  Subjective/Objective:                    Action/Plan: MD ordering palliative care consult for GOC. CM following for discharge disposition.  Expected Discharge Date:                  Expected Discharge Plan:  Skilled Nursing Facility  In-House Referral:  Clinical Social Work  Discharge planning Services  CM Consult  Post Acute Care Choice:    Choice offered to:     DME Arranged:    DME Agency:     HH Arranged:    HH Agency:     Status of Service:  In process, will continue to follow  If discussed at Long Length of Stay Meetings, dates discussed:    Additional Comments:  Kermit Balo, RN 01/01/2016, 11:14 AM

## 2016-01-01 NOTE — Consult Note (Signed)
Consultation Note Date: 01/01/2016   Patient Name: [name]  DOB: [birth year]  MRN: [id number]  Age / Sex: 82 y.o., female  PCP: Merrilee Seashore, MD Referring Physician: Robbie Lis, MD  Reason for Consultation: Establishing goals of care  HPI/Patient Profile: 82 y.o. female  with past medical history of CKD IV (not dialysis candidate, CAD, uncontrolled HTN, CVA, DM, PVD (s/p BKA), chronic diastolic HF (had echo today) admitted on 12/28/2015 with mental status changes. Workup thus far revealed acute posterior MCA watershed infarct (no hemorrhage or mass effect). EEG shows diffuse slowing of waking background. Deficits include global aphasia, right hemiplegia and dysphagia.   Clinical Assessment and Goals of Care: Met with patient's son [name]. Prior to admission patient was living with [name]. [name] states he was doing "about 75%" of ADL's for patient including feeding and bathing. She was able to ambulate and communicate well. She was noncompliant with medications and noted to have uncontrolled HTN. Albert's main concern is that he has had two strokes himself and will likely not be able to care for his mother in her current condition. He is understanding that patient will likely require SNF at discharge. Attempted to discuss goals of care with [name], but it was revealed that his sister, [name] is main decision maker due to Albert's short term memory from hx CVA's. [name] was not present and works during the day. Therefore, f/u meeting planned for tomorrow 01/01/2016 to further discuss goals. On exam patient smiled, laughed inappropriately, did not follow commands. She was able to nod her head no when asked if she was having any pain.    Primary Decision Maker NEXT OF KIN- daughter- Junie Panning, son- Laqueta Due, daughter- Dianah Field    SUMMARY OF RECOMMENDATIONS -F/U [place] meeting tomorrow  with appropriate decision makers present    Code Status/Advance Care Planning:  Full code    Symptom Management:   None  Palliative Prophylaxis:   Aspiration, Delirium Protocol, Frequent Pain Assessment and Turn Reposition  Additional Recommendations (Limitations, Scope, Preferences):  Full Scope Treatment for now- plan to discuss with patient's daughters and son when meeting can be arranged  Psycho-social/Spiritual:   Desire for further Chaplaincy support:No  Additional Recommendations:   Prognosis:    Unable to determine  Discharge Planning: To Be Determined anticipate SNF with palliative services  Primary Diagnoses: Present on Admission: . Altered mental state . CAD - moderate at cath July 2012 (medical Rx) . Hypothyroid . Chronic renal insufficiency, stage IV (severe)- HD started 01/05/13 . Diabetes mellitus type 2 with peripheral artery disease ([place])   I have reviewed the medical record, interviewed the patient and family, and examined the patient. The following aspects are pertinent.  Past Medical History:  Diagnosis Date  . Abnormal nuclear stress test 06/01/09   Demonstrated a new area of infarct scar, peri-infarct ischemia seen in the inferolateral territory. EF eas normal at 70% with mild hypocontractility at the apex, distal inferolateral wall.  . Anemia   . Arthritis   . Cardiomyopathy,  idiopathic ([place]) 02/08/2011  . Chronic diastolic CHF (congestive heart failure) (HCC)    Takes Lasix  . Chronic kidney disease (CKD), stage IV (severe) ([place])   . Coronary artery disease    a. Cath 09/2010 - med rx.  . Diabetes mellitus    type 2 NIDDM  . Dyslipidemia   . GERD (gastroesophageal reflux disease)   . Gout    takes allopurinol  . H/O echocardiogram 09/06/11   Indication- nonIschemic Cardiomyopathy. EF = now greater than 55% with no regional wall motion abnormalities. Tthere is mild to moderate trisuspid regurgitayion and mild pulmonary hypertension  with an RVSP of 35 mmHg as well as stage 1 diastolic dysfunction and mild to moderate LVH.  Marland Kitchen Hx of transient ischemic attack (TIA)   . Hypertension   . Hypothyroidism    (SEVERE) Takes Levothryroxine  . Irregular heartbeat   . Memory loss   . Myocardial infarct    x 3 unsure of years  . Nonischemic cardiomyopathy (HCC)    EF now is 55%, reduced due to myxedema, which is improved.  . Peripheral neuropathy ([place])   . Peripheral vascular disease ([place])    a. s/p L BKA.  . Shingles   . Stroke ([place])   . TIA (transient ischemic attack)   . Ulcer [facility])    Social History   Social History  . Marital status: Widowed    Spouse name: N/A  . Number of children: 4  . Years of education: N/A   Occupational History  . homemaker    Social History Main Topics  . Smoking status: Former Smoker    Packs/day: 0.50    Years: 40.00    Quit date: 07/05/1986  . Smokeless tobacco: Former Systems developer     Comment: quit smoking 1988  . Alcohol use No  . Drug use: No  . Sexual activity: No   Other Topics Concern  . None   Social History Narrative  . None   Family History  Problem Relation Age of Onset  . Heart attack Daughter   . Heart disease Daughter     Before age 53  . Cancer Sister     STOMACH  . Diabetes Sister   . Cancer Brother     BONE  . Diabetes Brother   . Hyperlipidemia Daughter   . Hypertension Daughter   . Heart disease Daughter     before age 34  . Kidney disease Daughter   . Other Daughter     varicose veins  . Diabetes Daughter   . Heart disease Son     before age 55  . Hyperlipidemia Son   . Hypertension Son   . Heart attack Son   . Anesthesia problems Neg Hx   . Hypotension Neg Hx   . Malignant hyperthermia Neg Hx   . Pseudochol deficiency Neg Hx    Scheduled Meds: . aspirin  300 mg Rectal Daily   Or  . aspirin  325 mg Oral Daily  . chlorhexidine  15 mL Mouth Rinse BID  . enoxaparin (LOVENOX) injection  40 mg Subcutaneous Q24H  . famotidine (PEPCID) IV   20 mg Intravenous Q12H  . insulin aspart  0-9 Units Subcutaneous Q4H  . levothyroxine  50 mcg Intravenous Daily  . mouth rinse  15 mL Mouth Rinse q12n4p   Continuous Infusions: . sodium chloride 75 mL/hr at 01/01/16 0608   PRN Meds:.acetaminophen **OR** acetaminophen, hydrALAZINE, levalbuterol, perflutren lipid microspheres (DEFINITY) IV suspension Medications Prior to  Admission:  Prior to Admission medications   Medication Sig Start Date End Date Taking? Authorizing Provider  acetaminophen (TYLENOL) 325 MG tablet Take 650 mg by mouth every 6 (six) hours as needed for mild pain.   Yes Historical Provider, MD  albuterol-ipratropium (COMBIVENT) 18-103 MCG/ACT inhaler Inhale 2 puffs into the lungs at bedtime as needed for wheezing or shortness of breath.    Yes Historical Provider, MD  allopurinol (ZYLOPRIM) 100 MG tablet Take 200 mg by mouth daily.    Yes Historical Provider, MD  amLODipine (NORVASC) 10 MG tablet Take 1 tablet (10 mg total) by mouth daily. 10/22/15  Yes Pixie Casino, MD  aspirin EC 81 MG tablet Take 81 mg by mouth every morning.    Yes Historical Provider, MD  calcitRIOL (ROCALTROL) 0.25 MCG capsule Take 0.25 mcg by mouth every other day.  11/04/12  Yes Historical Provider, MD  Calcium Carbonate-Vitamin D (CALCIUM-VITAMIN D) 500-200 MG-UNIT tablet Take 1 tablet by mouth daily.   Yes Historical Provider, MD  clopidogrel (PLAVIX) 75 MG tablet Take 75 mg by mouth every evening.    Yes Historical Provider, MD  docusate sodium (COLACE) 100 MG capsule Take 100 mg by mouth daily as needed for mild constipation.   Yes Historical Provider, MD  famotidine (PEPCID) 20 MG tablet Take 1 tablet (20 mg total) by mouth at bedtime. 11/13/15  Yes Eugenie Filler, MD  feeding supplement (BOOST / RESOURCE BREEZE) LIQD Take 1 Container by mouth 3 (three) times daily between meals. 11/13/15  Yes Eugenie Filler, MD  furosemide (LASIX) 80 MG tablet TAKE 1 TABLET (80 MG TOTAL) BY MOUTH 2 (TWO) TIMES  DAILY. 01/07/15  Yes Pixie Casino, MD  gabapentin (NEURONTIN) 300 MG capsule Take 300 mg by mouth 3 (three) times daily.   Yes Historical Provider, MD  HYDROcodone-acetaminophen (NORCO/VICODIN) 5-325 MG tablet Take 1-2 tablets by mouth every 6 (six) hours as needed. Patient taking differently: Take 1-2 tablets by mouth every 6 (six) hours as needed for moderate pain.  06/12/15  Yes Davonna Belling, MD  insulin glargine (LANTUS) 100 UNIT/ML injection Inject 0.22 mLs (22 Units total) into the skin daily. Patient taking differently: Inject 24 Units into the skin every morning.  08/23/13  Yes Barton Dubois, MD  isosorbide mononitrate (IMDUR) 60 MG 24 hr tablet Take 60 mg by mouth daily.   Yes Historical Provider, MD  levothyroxine (SYNTHROID, LEVOTHROID) 100 MCG tablet Take 1 tablet (100 mcg total) by mouth daily before breakfast. 11/13/15  Yes Eugenie Filler, MD  metoCLOPramide (REGLAN) 5 MG tablet Take 1 tablet (5 mg total) by mouth 4 (four) times daily -  before meals and at bedtime. 11/13/15  Yes Eugenie Filler, MD  nitroGLYCERIN (NITROSTAT) 0.4 MG SL tablet Place 0.4 mg under the tongue every 5 (five) minutes as needed for chest pain.    Yes Historical Provider, MD  pravastatin (PRAVACHOL) 40 MG tablet Take 40 mg by mouth every evening.    Yes Historical Provider, MD  RENVELA 800 MG tablet Take 800 mg by mouth 3 (three) times daily with meals.  07/04/13  Yes Historical Provider, MD  sodium bicarbonate 650 MG tablet Take 1 tablet (650 mg total) by mouth daily. 11/13/15  Yes Eugenie Filler, MD  hydrALAZINE (APRESOLINE) 50 MG tablet Take 1 tablet (50 mg total) by mouth 2 (two) times daily. Patient not taking: Reported on 12/29/2015 10/22/15   Pixie Casino, MD   Allergies  Allergen Reactions  .  Aspirin Nausea And Vomiting    325 mg (adult strength) [name]    Review of Systems  Unable to perform ROS: Mental status change     Physical Exam  Constitutional: She appears well-developed and well-nourished. No distress.  HENT:  Head: Normocephalic and atraumatic.  Cardiovascular: Regular rhythm and intact distal pulses.   Tachycardic   Pulmonary/Chest: Effort normal.  diminished  Abdominal: Soft. Bowel sounds are normal. There is no tenderness.  Musculoskeletal:  Did not follow commands for assessment  Neurological:  Aphasic, R facial droop  Skin: Skin is warm and dry.  Psychiatric:  Pleasant, laughing inappropriately    Vital Signs: BP (!) 159/83 (BP Location: Right Arm)   Pulse (!) 108   Temp 98.1 F (36.7 C) (Axillary)   Resp 20   Ht 5' (1.524 m) Comment: from previous encounter  Wt 54.9 kg (121 lb) Comment: from previous encounter  SpO2 100%   BMI 23.63 kg/m  Pain Assessment: No/denies pain       SpO2: SpO2: 100 % O2 Device:SpO2: 100 % O2 Flow Rate: .O2 Flow Rate (L/min): 2 L/min  IO: Intake/output summary:  Intake/Output Summary (Last 24 hours) at 01/01/16 1029 Last data filed at 12/31/15 1300  Gross per 24 hour  Intake                0 ml  Output                0 ml  Net                0 ml    LBM:   Baseline Weight: Weight: 54.9 kg (121 lb) (from previous encounter) Most recent weight: Weight: 54.9 kg (121 lb) (from previous encounter)     Palliative Assessment/Data: PPS: 40%     Thank you for this consult. Palliative medicine will continue to follow and assist as needed.   Time In: 0900 Time Out: 0930 Time Total: 30 minutes Greater than 50%  of this time was spent counseling and coordinating care related to the above assessment and plan.  Signed by: Mariana Kaufman, AGNP-C Palliative Medicine    Please contact Palliative Medicine Team phone at [phone] for questions and concerns.  For individual provider: See Shea Evans

## 2016-01-01 NOTE — Progress Notes (Signed)
Nutrition Follow-up   INTERVENTION:  Provide Boost Breeze po BID, each supplement provides 250 kcal and 9 grams of protein Monitor diet advancement Monitor goals of care  NUTRITION DIAGNOSIS:   Predicted suboptimal nutrient intake related to inability to eat, dysphagia, lethargy/confusion as evidenced by NPO status.  Ongoing; now on clear liquids  GOAL:   Patient will meet greater than or equal to 90% of their needs  Unmet  MONITOR:   Diet advancement, PO intake, Labs, Weight trends, Skin  REASON FOR ASSESSMENT:   Low [place]    ASSESSMENT:   82 y.o. female with history of TIA, Left BKA, PVD, CKD IV, HTN, HLD, CAD, chronic diastolic heart failure and DM  presenting with acute mental status changes including slurred speech, aphasia, decreased awareness, right-sided weakness and right facial droop.  Pt was assessed by SLP this morning and approved for intake of thin clear liquids. Per chart palliative care is involved with plan for f/u meeting tomorrow to further discuss goals of care. Will order Boost Breeze for now to provide additional calories and protein while on clear liquids. Will continue to monitor goals of care.   Labs: glucose ranging 106 to 133 mg/dL, low calcium  Diet Order:  Diet clear liquid Room service appropriate? Yes; Fluid consistency: Thin  Skin:  Reviewed, no issues  Last BM:  10/12  Height:   Ht Readings from Last 1 Encounters:  12/30/15 5' (1.524 m)    Weight:   Wt Readings from Last 1 Encounters:  12/30/15 121 lb (54.9 kg)    Ideal Body Weight:     BMI:  Body mass index is 23.63 kg/m.  Estimated Nutritional Needs:   Kcal:  1200-1400  Protein:  60-70 grams  Fluid:  1.4 L/day  EDUCATION NEEDS:   No education needs identified at this time  Dorothea Ogle RD, CSP, LDN Inpatient Clinical Dietitian Pager: [phone] After Hours Pager: [phone]

## 2016-01-02 DIAGNOSIS — I2510 Atherosclerotic heart disease of native coronary artery without angina pectoris: Secondary | ICD-10-CM

## 2016-01-02 DIAGNOSIS — Z515 Encounter for palliative care: Secondary | ICD-10-CM

## 2016-01-02 LAB — GLUCOSE, CAPILLARY
GLUCOSE-CAPILLARY: 114 mg/dL — AB (ref 65–99)
GLUCOSE-CAPILLARY: 145 mg/dL — AB (ref 65–99)
Glucose-Capillary: 103 mg/dL — ABNORMAL HIGH (ref 65–99)
Glucose-Capillary: 143 mg/dL — ABNORMAL HIGH (ref 65–99)

## 2016-01-02 MED ORDER — FUROSEMIDE 10 MG/ML IJ SOLN
20.0000 mg | Freq: Once | INTRAMUSCULAR | Status: AC
  Administered 2016-01-02: 20 mg via INTRAVENOUS
  Filled 2016-01-02: qty 4

## 2016-01-02 NOTE — Clinical Social Work Note (Signed)
Clinical Social Work Assessment  Patient Details  Name: [name] MRN: [id number] Date of Birth: [birth year]  Date of referral:  01/02/16               Reason for consult:  Facility Placement, Discharge Planning                Permission sought to share information with:  Facility Medical sales representative, Family Supports Permission granted to share information::  Yes, Verbal Permission Granted  Name::     [name]  Agency::  SNF's  Relationship::  Daughter  Contact Information:  [phone]  Housing/Transportation Living arrangements for the past 2 months:  Single Family Home Source of Information:  Medical Team, Adult Children Patient Interpreter Needed:  None Criminal Activity/Legal Involvement Pertinent to Current Situation/Hospitalization:  No - Comment as needed Significant Relationships:  Adult Children Lives with:  Adult Children Do you feel safe going back to the place where you live?  Yes Need for family participation in patient care:  Yes (Comment)  Care giving concerns:  PT recommending SNF once medically stable for discharge.   Social Worker assessment / plan:  Patient not fully oriented. CSW called patient's daughter, [name]. CSW introduced role and explained that discharge planning would be discussed. Patient's daughter agreeable to SNF placement. Discussed preferences. Patient's daughter stated that she will have had a 6 month wellness period by October 29. CSW explained that she only needs a 60-day wellness period. No further concerns. CSW encouraged patient's daughter to contact CSW as needed. CSW will continue to follow patient and her family for support and facilitate discharge to SNF once medically stable.  Employment status:  Retired Database administrator PT Recommendations:  Skilled Nursing Facility Information / Referral to community resources:  Skilled Nursing Facility  Patient/Family's Response to care:  Patient  not fully oriented. Patient's family agreeable to SNF placement. Patient's family supportive and involved in patient's care. Patient's daughter appreciated social work intervention.  Patient/Family's Understanding of and Emotional Response to Diagnosis, Current Treatment, and Prognosis:  Patient not fully oriented. Patient's children understand the need for rehab prior to returning home. Patient's daughter appears happy with hospital care.  Emotional Assessment Appearance:  Appears stated age Attitude/Demeanor/Rapport:  Unable to Assess Affect (typically observed):  Unable to Assess Orientation:  Oriented to Self Alcohol / Substance use:  Never Used Psych involvement (Current and /or in the community):  No (Comment)  Discharge Needs  Concerns to be addressed:  Care Coordination Readmission within the last 30 days:  No Current discharge risk:  Cognitively Impaired, Dependent with Mobility Barriers to Discharge:  Insurance Authorization   Margarito Liner, LCSW 01/02/2016, 3:28 PM

## 2016-01-02 NOTE — Progress Notes (Signed)
Patient is in bed, alert, she is eating breakfast with the assistance of staff member, denies pain/discomfort. Will continue to monitor.

## 2016-01-02 NOTE — Clinical Social Work Placement (Signed)
   CLINICAL SOCIAL WORK PLACEMENT  NOTE  Date:  01/02/2016  Patient Details  Name: [name] MRN: [id number] Date of Birth: Sep 04, 1923  Clinical Social Work is seeking post-discharge placement for this patient at the Skilled  Nursing Facility level of care (*CSW will initial, date and re-position this form in  chart as items are completed):  Yes   Patient/family provided with [facility] Clinical Social Work Department's list of facilities offering this level of care within the geographic area requested by the patient (or if unable, by the patient's family).  Yes   Patient/family informed of their freedom to choose among providers that offer the needed level of care, that participate in Medicare, Medicaid or managed care program needed by the patient, have an available bed and are willing to accept the patient.  Yes   Patient/family informed of [facility]'s ownership interest in [facility] and [facility], as well as of the fact that they are under no obligation to receive care at these facilities.  PASRR submitted to EDS on 01/01/16     PASRR number received on 01/01/16     Existing PASRR number confirmed on       FL2 transmitted to all facilities in geographic area requested by pt/family on 01/02/16     FL2 transmitted to all facilities within larger geographic area on       Patient informed that his/her managed care company has contracts with or will negotiate with certain facilities, including the following:            Patient/family informed of bed offers received.  Patient chooses bed at       Physician recommends and patient chooses bed at      Patient to be transferred to   on  .  Patient to be transferred to facility by       Patient family notified on   of transfer.  Name of family member notified:        PHYSICIAN       Additional Comment:    _______________________________________________ Margarito Liner, LCSW 01/02/2016, 3:31 PM

## 2016-01-02 NOTE — Clinical Social Work Note (Signed)
CSW called patient's son for assessment. He stated that they were interested SNF but to call his sister, [name], because she is the Management consultant. CSW called but voicemail was full. Will try again later.  Charlynn Court, CSW [phone]

## 2016-01-02 NOTE — Progress Notes (Addendum)
Pt is heard wheezing, observed short breathes w/prior history of CHF, MD was notified.  MD orders stop on IV fluid and administration of Laxis. Will continue to monitor. Also resp therapist is called to administer PRN neb.

## 2016-01-02 NOTE — Progress Notes (Signed)
Daily Progress Note   Patient Name: [name]       Date: 01/02/2016 DOB: September 09, 1923  Age: 82 y.o. MRN#: [id number] Attending Physician: Robbie Lis, MD Primary Care Physician: Merrilee Seashore, MD Admit Date: 12/28/2015  Reason for Consultation/Follow-up: Establishing goals of care and Psychosocial/spiritual support  Subjective: Met with patient's daughters, and son [name]. Patient's daughter Junie Panning, pt's Chauncey Reading is present as well as other sister. Patient is more alert today per [name] then earlier in the week and is also speaking some. She is taking sips of clear liquid. Patient lived with her son [name] who also has extensive health problems including 2 strokes as well as 5 MI's. With patient's new right sided weakness( has left BKA) the concern is that she will not be able to return home under [facility]'s care.  This was an introductory meeting by palliative care which included extensive clinical education, as well as introduction to medical terms such as full code and DO NOT RESUSCITATE. These were new terms to this family until they had read the booklet "hard choices for loving people" that was left by palliative medicine provider. They state that they have never heard her mother discussed these things. They showed a good grasp of why certain aggressive measures would not be curative in the setting of multiple comorbidities however they wish to speak amongst themselves before making a decision regarding changing of CODE STATUS. Their hope is after rehabilitation she will be strong enough to go home again and live with her son [place] Aid of Stay: 4  Current Medications: Scheduled Meds:  . aspirin  300 mg Rectal Daily   Or  . aspirin  325 mg Oral Daily  . chlorhexidine  15  mL Mouth Rinse BID  . enoxaparin (LOVENOX) injection  40 mg Subcutaneous Q24H  . famotidine (PEPCID) IV  20 mg Intravenous Q12H  . feeding supplement  1 Container Oral BID BM  . insulin aspart  0-9 Units Subcutaneous Q4H  . levothyroxine  50 mcg Intravenous Daily  . mouth rinse  15 mL Mouth Rinse q12n4p  . pravastatin  40 mg Oral QPM    Continuous Infusions: . sodium chloride 75 mL/hr at 01/02/16 1054    PRN Meds: acetaminophen **OR** acetaminophen, hydrALAZINE, levalbuterol  Physical Exam  Constitutional: She appears well-nourished.  Pulmonary/Chest: Effort normal.  Neurological:  Fluctuating LOC. Alert at times and beginning to speak some; taking sips of clear liquids but readily falls back to sleep  Skin: Skin is warm.  Nursing note and vitals reviewed.           Vital Signs: BP (!) 175/93 (BP Location: Right Arm)   Pulse (!) 111   Temp 98.4 F (36.9 C) (Axillary)   Resp 18   Ht 5' (1.524 m) Comment: from previous encounter  Wt 54.9 kg (121 lb) Comment: from previous encounter  SpO2 96%   BMI 23.63 kg/m  SpO2: SpO2: 96 % O2 Device: O2 Device: Nasal Cannula O2 Flow Rate: O2 Flow Rate (L/min): 2 L/min  Intake/output summary:  Intake/Output Summary (Last 24 hours) at 01/02/16 1426 Last data filed at 01/02/16 0400  Gross per 24 hour  Intake          5013.75 ml  Output                0 ml  Net          5013.75 ml   LBM:   Baseline Weight: Weight: 54.9 kg (121 lb) (from previous encounter) Most recent weight: Weight: 54.9 kg (121 lb) (from previous encounter)       Palliative Assessment/Data:    Flowsheet Rows   Flowsheet Row Most Recent Value  Intake Tab  Referral Department  Hospitalist  Unit at Time of Referral  Med/Surg Unit  Palliative Care Primary Diagnosis  Neurology  Date Notified  12/31/15  Palliative Care Type  New Palliative care  Reason for referral  Clarify Goals of Care  Date of Admission  12/28/15  # of days IP prior to Palliative  referral  3  Clinical Assessment  Psychosocial & Spiritual Assessment  Palliative Care Outcomes      Patient Active Problem List   Diagnosis Date Noted  . Altered mental status   . Goals of care, counseling/discussion   . Palliative care by specialist   . Aphasia following other cerebrovascular disease   . Hemi-neglect of right side   . Cerebral thrombosis with cerebral infarction 12/30/2015  . Cerebrovascular accident (CVA) involving left middle cerebral artery [facility])   . Altered mental state 12/29/2015  . Acute right-sided weakness 12/29/2015  . Dysphagia   . FTT (failure to thrive) in adult 11/08/2015  . Nausea & vomiting 11/08/2015  . Metabolic acidosis 89/16/9450  . Hypertensive heart disease with heart failure ([place]) 10/07/2015  . Junctional bradycardia 09/18/2015  . Atypical chest pain 06/23/2014  . Neck pain   . Food impaction of esophagus   . Epiglottitis 04/13/2014  . Wound disruption, post-op, skin 02/05/2014  . Pre-operative cardiovascular examination 12/13/2013  . Foot pain 09/04/2013  . CKD (chronic kidney disease), stage IV ([place]) 09/04/2013  . Uncontrolled hypertension 08/23/2013  . Malnutrition of moderate degree ([place]) 08/21/2013  . Chronic diastolic heart failure ([place]) 08/15/2013  . Anemia of chronic disease 08/15/2013  . Hyperkalemia 08/15/2013  . Chronic total occlusion of artery of the extremities ([place]) 02/27/2013  . Dyslipidemia 01/05/2013  . Chronic renal insufficiency, stage IV (severe)- HD started 01/05/13 01/05/2013  . Diabetes mellitus type 2 with peripheral artery disease ([place]) 01/05/2013  . Peripheral vascular disease ([place]) 10/19/2011  . CAD - moderate at cath July 2012 (medical Rx) 02/08/2011  . 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 Hypothyroid 02/08/2011    Palliative Care Assessment & Plan   Patient Profile:  82 y.o. female  with past medical history of CKD IV (not dialysis candidate, CAD, uncontrolled  HTN, CVA, DM, PVD (s/p BKA), chronic diastolic HF (had echo today) admitted on 12/28/2015 with mental status changes. Workup thus far revealed acute posterior MCA watershed infarct (no hemorrhage or mass effect). EEG shows diffuse slowing of waking background. Deficits , right hemiplegia and dysphagia.    Assessment: Patient still sleeping most of the time but she is having more periods of wakefulness and is also speaking some. She is taking sips of clear liquid. She denies any pain. No dyspnea observed  Recommendations/Plan:  Continue to treat the treatable  Continue with full code. Daughter chronic asked if we want to change her CODE STATUS how do we go about this? Told her to call the palliative medicine office will let her nurse or doctor know  Goal is to improve to go to a skilled rehabilitation, improve function on the right side so she can return home with her son  Goals of Care and Additional Recommendations:  Limitations on Scope of Treatment: Full Scope Treatment  Code Status:    Code Status Orders        Start     Ordered   12/29/15 0142  Full code  Continuous     12/29/15 0145    Code Status History    Date Active Date Inactive Code Status Order ID Comments User Context   11/08/2015  8:11 PM 11/13/2015  4:39 PM Full Code [id number]  Samella Parr, NP Inpatient   06/22/2014  6:09 PM 06/23/2014  5:51 PM Full Code [id number]  Verlee Monte, MD Inpatient   04/13/2014  4:52 PM 04/17/2014  8:25 PM Full Code [id number]  Albin Felling, MD Inpatient   03/17/2014  2:24 PM 03/17/2014  6:50 PM Full Code [id number]  Conrad Knierim, MD Inpatient   08/15/2013  6:49 PM 08/23/2013  7:15 PM Full Code [id number]  Robbie Lis, MD Inpatient   06/15/2013  7:14 PM 06/17/2013  9:33 PM Full Code [id number]  Shanda Howells, MD ED   01/10/2013  5:20 PM 01/16/2013  8:43 PM DNR [id number]  Acquanetta Chain, DO Inpatient   01/05/2013  1:20 AM 01/10/2013  5:20 PM Full Code [id number]  Theressa Millard, MD ED    07/05/2011 12:56 PM 07/12/2011  6:13 PM Full Code [id number]  Renee Pain, RN Inpatient   02/08/2011  2:10 AM 02/09/2011  6:43 PM Partial Code [id number]  Freddy Jaksch, RN Inpatient       Prognosis:   Unable to determine  Discharge Planning:  Skilled rehab  Thank you for allowing the Palliative Medicine Team to assist in the care of this patient.   Time In: 1100 Time Out: 1215 Total Time 75 min Prolonged Time Billed  no       Greater than 50%  of this time was spent counseling and coordinating care related to the above assessment and plan.  Dory Horn, NP  Please contact Palliative Medicine Team phone at [phone] for questions and concerns.

## 2016-01-02 NOTE — Progress Notes (Signed)
Patient ID: [name], female   DOB: [birth year], 82 y.o.   MRN: [id number]  PROGRESS NOTE    [name]  [name] DOB: [birth year] DOA: 12/28/2015  PCP: Georgianne Fick, MD   Brief Narrative:  82 y.o.woman with a history of prior TIA, Left BKA, PVD, CKD IV (she is not a dialysis patient), HTN, HLD, CAD, chronic diastolic heart failure, and DM who presented to [facility] ED with worsening mental status changes including slurred speech, aphasia, right sided weakness and right sided facial droop.  CODE STROKE was called upon arrival. Neurology has seen the patient in consultation . She was not felt to be a tPA candidate due to inconsistent physical exam findings and concerns for acute encephalopathy. Head CT was negative for acute process (though chronic changes were noted). Chest xray was negative for acute process.   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 Progressed and widespread cerebral white matter signal abnormality since 2006, favor chronic small vessel disease. Chronic left thalamic lacunar infarct. - Carotid doppler showed bilateral:  1-39% ICA stenosis. Unable to obtain vertebral artery images bilaterally due to constant movement of the head  - EEG showed diffuse slowing, no seizures  - ECHO showed EF 30%. Moderate diffuse hypokinesis with distinct regional wall motion abnormalities. Severe hypokinesis of the anteroseptal,  anterior, and apical myocardium; consistent with ischemia in the distribution of the left anterior descending coronary artery. Possible dyskinesis of the apical myocardium. Hypokinesis of the mid-apicalanterolateral and inferolateral myocardium; consistent with ischemia in the distribution of the left circumflex coronary artery. Due to tachycardia, there was fusion of early and atrial contributions to ventricular filling. Doppler parameters are consistent with restrictive physiology, indicative of decreased left ventricular diastolic compliance and/or increased left atrial pressure. Acoustic contrast opacification revealed no evidence ofthrombus. - A1c is 6.9 - LDL is 188, goal is less than 100. Pt not yet on statin, will start stain therapy once passes swallow screen - Appreciate neurology following and their recommendations  - Per PT - SNF placement, appreciate SW assistance with discharge planning  - Palliative care assistance also appreciated   Active Problems:   CAD - moderate at cath July 2012 (medical Rx) - Continue aspirin daily     Dyslipidemia associated with type 2 DM  - Continue statin therapy     Hypothyroidism - Continue IV synthroid     Hypokalemia - Supplemented and now WNL    Essential hypertension - Permissive hypertension - BP 164/96 - Added hydralazine 5 mg IV every 8 hours as needed for BP above 160/90     Chronic renal insufficiency, stage IV  - Cr 1.9 at baseline - Cr on this admission 2, within baseline range  - Repeat BMP in am     Diabetes mellitus type 2 with peripheral artery disease and diabetic nephropathy and long term insulin use (HCC) - Continue SSI - CBG's in past 24 hours: 123, 114, 103    DVT prophylaxis: Lovenox subQ Code Status: full code  Family Communication:  family at the bedside this am Disposition Plan: to SNF once cleared by neurology    Consultants:   Neurology  Nutrition  SLP  PT  PCT   Procedures:   EEG - diffuse  cerebral dysfunction nonspecific   Carotid doppler -  Bilateral:  1-39% ICA stenosis. Unable to obtain vertebral artery images bilaterally due to constant movement of the head   2 D ECHO 01/01/2016 - showed EF 30%. Moderate diffuse hypokinesis with distinct regional wall motion abnormalities. Severe hypokinesis of the anteroseptal, anterior, and apical myocardium; consistent with ischemia in the distribution of the left anterior descending coronary artery. Possible dyskinesis of the apical myocardium. Hypokinesis of the mid-apicalanterolateral and inferolateral myocardium; consistent with ischemia in the distribution of the left circumflex coronary artery. Due to tachycardia, there was fusion of early and atrial contributions to ventricular filling. Doppler parameters are consistent with restrictive physiology, indicative of decreased left ventricular diastolic compliance and/or increased left atrial pressure. Acoustic contrast opacification revealed no evidence ofthrombus.  Antimicrobials:   None    Subjective: No overnight events. More alert this am.  Objective: Vitals:   01/01/16 2140 01/02/16 0207 01/02/16 0604 01/02/16 1100  BP: (!) 172/94 (!) 135/94 (!) 181/81 (!) 164/96  Pulse: (!) 105 (!) 107 (!) 105 (!) 106  Resp: 19 18 18 18   Temp: 97.6 F (36.4 C) 97.4 F (36.3 C) 98.3 F (36.8 C) 98.6 F (37 C)  TempSrc: Oral Oral Oral Oral  SpO2: 100% 100% 100% 100%  Weight:      Height:        Intake/Output Summary (Last 24 hours) at 01/02/16 1108 Last data filed at 01/02/16 0400  Gross per 24 hour  Intake          5133.75 ml  Output                0 ml  Net          5133.75 ml   Filed Weights   12/30/15 1321  Weight: 54.9 kg (121 lb)    Examination:  General exam: No distress, more alert this am Respiratory system: No wheezing, no rhonchi, bilateral air entry  Cardiovascular system: S1 & S2 heard, RRR Gastrointestinal system: (+) BS, non tender, non distended  Central  nervous system: nonfocal   Extremities: Palpable pulses, no edema, left BKA Skin: no lesions or ulcers  Psychiatry: normal mood and behavior   Data Reviewed: I have personally reviewed following labs and imaging studies  CBC:  Recent Labs Lab 12/28/15 2226 12/28/15 2231  WBC 6.4  --   NEUTROABS 3.3  --   HGB 10.8* 12.2  HCT 35.1* 36.0  MCV 90.2  --   PLT 299  --    Basic Metabolic Panel:  Recent Labs Lab 12/28/15 2226 12/28/15 2231 12/31/15 0319  NA 139 139 142  K 3.4* 3.4* 4.2  CL 103 104 114*  CO2 22  --  16*  GLUCOSE 161* 156* 137*  BUN 11 16 13   CREATININE 2.12* 2.00* 2.07*  CALCIUM 8.9  --  8.2*   GFR: Estimated Creatinine Clearance: 14.1 mL/min (by C-G formula based on SCr of 2.07 mg/dL (H)). Liver Function Tests:  Recent Labs Lab 12/28/15 2226  AST 34  ALT 19  ALKPHOS 66  BILITOT 0.4  PROT 6.3*  ALBUMIN 3.3*   No results for input(s): LIPASE, AMYLASE in the last 168 hours. No results for input(s): AMMONIA in the last 168 hours. Coagulation Profile:  Recent  Labs Lab 12/28/15 2226  INR 1.14   Cardiac Enzymes: No results for input(s): CKTOTAL, CKMB, CKMBINDEX, TROPONINI in the last 168 hours. BNP (last 3 results) No results for input(s): PROBNP in the last 8760 hours. HbA1C: No results for input(s): HGBA1C in the last 72 hours. CBG:  Recent Labs Lab 01/01/16 1121 01/01/16 1630 01/01/16 2004 01/02/16 0009 01/02/16 0441  GLUCAP 116* 115* 123* 114* 103*   Lipid Profile: No results for input(s): CHOL, HDL, LDLCALC, TRIG, CHOLHDL, LDLDIRECT in the last 72 hours. Thyroid Function Tests: No results for input(s): TSH, T4TOTAL, FREET4, T3FREE, THYROIDAB in the last 72 hours. Anemia Panel: No results for input(s): VITAMINB12, FOLATE, FERRITIN, TIBC, IRON, RETICCTPCT in the last 72 hours. Urine analysis:    Component Value Date/Time   COLORURINE YELLOW 11/08/2015 1451   APPEARANCEUR CLEAR 11/08/2015 1451   LABSPEC 1.019 11/08/2015 1451     PHURINE 6.0 11/08/2015 1451   GLUCOSEU 100 (A) 11/08/2015 1451   HGBUR NEGATIVE 11/08/2015 1451   BILIRUBINUR SMALL (A) 11/08/2015 1451   KETONESUR NEGATIVE 11/08/2015 1451   PROTEINUR >300 (A) 11/08/2015 1451   UROBILINOGEN 0.2 08/18/2013 1807   NITRITE NEGATIVE 11/08/2015 1451   LEUKOCYTESUR NEGATIVE 11/08/2015 1451   Sepsis Labs: @LABRCNTIP (procalcitonin:4,lacticidven:4)   )No results found for this or any previous visit (from the past 240 hour(s)).    Radiology Studies: Dg Chest 2 View Result Date: 12/29/2015 No active cardiopulmonary disease.   Ct Head Wo Contrast Result Date: 12/28/2015  1. No acute intracranial pathology seen on CT. 2. Increased diffuse prominence of the supratentorial ventricles, with suggestion of underlying transependymal resorption of CSF, concerning for mild hydrocephalus. 3. Underlying mild cortical volume loss and scattered small vessel ischemic microangiopathy noted.   Mr Brain Wo Contrast Result Date: 12/29/2015 1. Acute posterior left MCA or MCA/PCA watershed infarct with no associated hemorrhage or mass effect. 2. Progressed and widespread cerebral white matter signal abnormality since 2006, favor chronic small vessel disease related. Chronic left thalamic lacunar infarct. 3.  The examination had to be discontinued prior to completion.   Scheduled Meds: . aspirin  300 mg Rectal Daily   Or  . aspirin  325 mg Oral Daily  . chlorhexidine  15 mL Mouth Rinse BID  . enoxaparin (LOVENOX) injection  40 mg Subcutaneous Q24H  . famotidine (PEPCID) IV  20 mg Intravenous Q12H  . feeding supplement  1 Container Oral BID BM  . insulin aspart  0-9 Units Subcutaneous Q4H  . levothyroxine  50 mcg Intravenous Daily  . mouth rinse  15 mL Mouth Rinse q12n4p  . pravastatin  40 mg Oral QPM   Continuous Infusions: . sodium chloride 75 mL/hr at 01/02/16 1054     LOS: 4 days    Time spent: 15 minutes  Greater than 50% of the time spent on counseling and  coordinating the care.   Manson Passey, MD [facility] Pager [phone]  If 7PM-7AM, please contact night-coverage [email or web address] Password [email or web address] 01/02/2016, 11:08 AM

## 2016-01-03 ENCOUNTER — Other Ambulatory Visit: Payer: Self-pay | Admitting: Internal Medicine

## 2016-01-03 DIAGNOSIS — I110 Hypertensive heart disease with heart failure: Secondary | ICD-10-CM

## 2016-01-03 LAB — BASIC METABOLIC PANEL
Anion gap: 15 (ref 5–15)
BUN: 20 mg/dL (ref 6–20)
CHLORIDE: 119 mmol/L — AB (ref 101–111)
CO2: 12 mmol/L — ABNORMAL LOW (ref 22–32)
Calcium: 8.9 mg/dL (ref 8.9–10.3)
Creatinine, Ser: 2.22 mg/dL — ABNORMAL HIGH (ref 0.44–1.00)
GFR, EST AFRICAN AMERICAN: 21 mL/min — AB (ref >60)
GFR, EST NON AFRICAN AMERICAN: 18 mL/min — AB (ref >60)
Glucose, Bld: 136 mg/dL — ABNORMAL HIGH (ref 65–99)
POTASSIUM: 4.7 mmol/L (ref 3.5–5.1)
SODIUM: 146 mmol/L — AB (ref 135–145)

## 2016-01-03 LAB — GLUCOSE, CAPILLARY
GLUCOSE-CAPILLARY: 139 mg/dL — AB (ref 65–99)
GLUCOSE-CAPILLARY: 215 mg/dL — AB (ref 65–99)
GLUCOSE-CAPILLARY: 103 mg/dL — AB (ref 65–99)
GLUCOSE-CAPILLARY: 140 mg/dL — AB (ref 65–99)
Glucose-Capillary: 102 mg/dL — ABNORMAL HIGH (ref 65–99)

## 2016-01-03 LAB — CBC
HCT: 33.6 % — ABNORMAL LOW (ref 36.0–46.0)
HEMOGLOBIN: 10.2 g/dL — AB (ref 12.0–15.0)
MCH: 27.6 pg (ref 26.0–34.0)
MCHC: 30.4 g/dL (ref 30.0–36.0)
MCV: 91.1 fL (ref 78.0–100.0)
PLATELETS: 158 10*3/uL (ref 150–400)
RBC: 3.69 MIL/uL — AB (ref 3.87–5.11)
RDW: 17.7 % — ABNORMAL HIGH (ref 11.5–15.5)
WBC: 6.1 10*3/uL (ref 4.0–10.5)

## 2016-01-03 MED ORDER — ENOXAPARIN SODIUM 30 MG/0.3ML ~~LOC~~ SOLN
30.0000 mg | SUBCUTANEOUS | Status: DC
  Administered 2016-01-04 – 2016-01-05 (×2): 30 mg via SUBCUTANEOUS
  Filled 2016-01-03 (×2): qty 0.3

## 2016-01-03 NOTE — Progress Notes (Signed)
Patient ID: [name], female   DOB: 07/07/1923, 82 y.o.   MRN: [id number]  PROGRESS NOTE    [name]  WUJ:[id number] DOB: 01/07/1924 DOA: 12/28/2015  PCP: Georgianne FickAMACHANDRAN,AJITH, MD   Brief Narrative:  82 y.o.woman with a history of prior TIA, Left BKA, PVD, CKD IV (she is not a dialysis patient), HTN, HLD, CAD, chronic diastolic heart failure, and DM who presented [facility] with worsening mental status changes including slurred speech, aphasia, right sided weakness and right sided facial droop.  [facility] was called upon arrival. Neurology has seen the patient in consultation . She was not felt to be a tPA candidate due to inconsistent physical exam findings and concerns for acute encephalopathy. Head CT was negative for acute process (though chronic changes were noted). Chest xray was negative for acute process.   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 Progressed and widespread cerebral white matter signal abnormality since 2006, favor chronic small vessel disease. Chronic left thalamic lacunar infarct. - Carotid doppler showed bilateral:  1-39% ICA stenosis. Unable to obtain vertebral artery images bilaterally due to constant movement of the head  - EEG showed diffuse slowing, no seizures  - ECHO showed EF 30%. Moderate diffuse hypokinesis with distinct regional wall motion abnormalities. Severe hypokinesis of the anteroseptal,  anterior, and apical myocardium; consistent with ischemia in the distribution of the left anterior descending coronary artery. Possible dyskinesis of the apical myocardium. Hypokinesis of the mid-apicalanterolateral and inferolateral myocardium; consistent with ischemia in the distribution of the left circumflex coronary artery. Due to tachycardia, there was fusion of early and atrial contributions to ventricular filling. Doppler parameters are consistent with restrictive physiology, indicative of decreased left ventricular diastolic compliance and/or increased left atrial pressure. Acoustic contrast opacification revealed no evidence ofthrombus. - A1c is 6.9 - LDL is 188, goal is less than 100. Pt not yet on statin, will start stain therapy once passes swallow screen - Per PT - SNF placement, appreciate SW assistance with discharge planning  - Palliative care following   Active Problems:   CAD - moderate at cath July 2012 (medical Rx) - Continue aspirin daily     Dyslipidemia associated with type 2 DM  - Continue statin therapy     Hypothyroidism - Continue synthroid     Hypokalemia - Supplemented     Essential hypertension - Permissive hypertension - Continue hydralazine 5 mg IV every 8 hours as needed for BP above 160/90     Chronic renal insufficiency, stage IV  - Cr 1.9 at baseline - Cr on this admission 2, within baseline range     Diabetes mellitus type 2 with peripheral artery disease and diabetic nephropathy and long term insulin use (HCC) - Continue SSI    DVT prophylaxis: Lovenox subQ Code Status: full code  Family Communication: family at the bedside this am [facility]    Consultants:   Neurology  Nutrition  SLP  PT  PCT   Procedures:  EEG - diffuse cerebral dysfunction nonspecific   Carotid doppler -  Bilateral:  1-39% ICA stenosis. Unable to obtain vertebral artery images bilaterally due to constant movement of the head   2 D  ECHO 01/01/2016 - showed EF 30%. Moderate diffuse hypokinesis with distinct regional wall motion abnormalities. Severe hypokinesis of the anteroseptal, anterior, and apical myocardium; consistent with ischemia in the distribution of the left anterior descending coronary artery. Possible dyskinesis of the apical myocardium. Hypokinesis of the mid-apicalanterolateral and inferolateral myocardium; consistent with ischemia in the distribution of the left circumflex coronary artery. Due to tachycardia, there was fusion of early and atrial contributions to ventricular filling. Doppler parameters are consistent with restrictive physiology, indicative of decreased left ventricular diastolic compliance and/or increased left atrial pressure. Acoustic contrast opacification revealed no evidence ofthrombus.  Antimicrobials:   None    Subjective: No overnight events.  Objective: Vitals:   01/03/16 0030 01/03/16 0040 01/03/16 0544 01/03/16 0940  BP: (!) 172/91 (!) 168/88 (!) 179/87 (!) 174/73  Pulse: 99 100 99 (!) 106  Resp: 18  18 18   Temp: 98.7 F (37.1 C)  98.6 F (37 C) 98.2 F (36.8 C)  TempSrc: Oral  Oral Oral  SpO2: 100%  100% 100%  Weight:      Height:        Intake/Output Summary (Last 24 hours) at 01/03/16 1142 Last data filed at 01/03/16 0900  Gross per 24 hour  Intake               50 ml  Output                0 ml  Net               50 ml   Filed Weights   12/30/15 1321  Weight: 54.9 kg (121 lb)    Examination:  General exam: No distress Respiratory system: No wheezing, no rhonchi Cardiovascular system: S1 & S2 heard, rate controlled  Gastrointestinal system: (+) BS, non tender Central nervous system: nonfocal   Extremities: Palpable pulses, no edema, left BKA Skin: warm, dry   Psychiatry: normal mood and behavior, more alert   Data Reviewed: I have personally reviewed following labs and imaging studies  CBC:  Recent Labs Lab 12/28/15 2226 12/28/15 2231  01/03/16 0500  WBC 6.4  --  6.1  NEUTROABS 3.3  --   --   HGB 10.8* 12.2 10.2*  HCT 35.1* 36.0 33.6*  MCV 90.2  --  91.1  PLT 299  --  158   Basic Metabolic Panel:  Recent Labs Lab 12/28/15 2226 12/28/15 2231 12/31/15 0319 01/03/16 0500  NA 139 139 142 146*  K 3.4* 3.4* 4.2 4.7  CL 103 104 114* 119*  CO2 22  --  16* 12*  GLUCOSE 161* 156* 137* 136*  BUN 11 16 13 20   CREATININE 2.12* 2.00* 2.07* 2.22*  CALCIUM 8.9  --  8.2* 8.9   GFR: Estimated Creatinine Clearance: 13.1 mL/min (by C-G formula based on SCr of 2.22 mg/dL (H)). Liver Function Tests:  Recent Labs Lab 12/28/15 2226  AST 34  ALT 19  ALKPHOS 66  BILITOT 0.4  PROT 6.3*  ALBUMIN 3.3*   No results for input(s): LIPASE, AMYLASE in the last 168 hours. No results for input(s): AMMONIA in the last 168 hours. Coagulation Profile:  Recent Labs Lab 12/28/15 2226  INR 1.14   Cardiac Enzymes: No results for input(s): CKTOTAL, CKMB, CKMBINDEX, TROPONINI in the last  168 hours. BNP (last 3 results) No results for input(s): PROBNP in the last 8760 hours. HbA1C: No results for input(s): HGBA1C in the last 72 hours. CBG:  Recent Labs Lab 01/02/16 0441 01/02/16 1954 01/02/16 2344 01/03/16 0356 01/03/16 1110  GLUCAP 103* 145* 143* 139* 215*   Lipid Profile: No results for input(s): CHOL, HDL, LDLCALC, TRIG, CHOLHDL, LDLDIRECT in the last 72 hours. Thyroid Function Tests: No results for input(s): TSH, T4TOTAL, FREET4, T3FREE, THYROIDAB in the last 72 hours. Anemia Panel: No results for input(s): VITAMINB12, FOLATE, FERRITIN, TIBC, IRON, RETICCTPCT in the last 72 hours. Urine analysis:    Component Value Date/Time   COLORURINE YELLOW 11/08/2015 1451   APPEARANCEUR CLEAR 11/08/2015 1451   LABSPEC 1.019 11/08/2015 1451   PHURINE 6.0 11/08/2015 1451   GLUCOSEU 100 (A) 11/08/2015 1451   HGBUR NEGATIVE 11/08/2015 1451   BILIRUBINUR SMALL (A) 11/08/2015 1451   KETONESUR NEGATIVE 11/08/2015 1451    PROTEINUR >300 (A) 11/08/2015 1451   UROBILINOGEN 0.2 08/18/2013 1807   NITRITE NEGATIVE 11/08/2015 1451   LEUKOCYTESUR NEGATIVE 11/08/2015 1451   Sepsis Labs: @LABRCNTIP (procalcitonin:4,lacticidven:4)   )No results found for this or any previous visit (from the past 240 hour(s)).    Radiology Studies: Dg Chest 2 View Result Date: 12/29/2015 No active cardiopulmonary disease.   Ct Head Wo Contrast Result Date: 12/28/2015  1. No acute intracranial pathology seen on CT. 2. Increased diffuse prominence of the supratentorial ventricles, with suggestion of underlying transependymal resorption of CSF, concerning for mild hydrocephalus. 3. Underlying mild cortical volume loss and scattered small vessel ischemic microangiopathy noted.   Mr Brain Wo Contrast Result Date: 12/29/2015 1. Acute posterior left MCA or MCA/PCA watershed infarct with no associated hemorrhage or mass effect. 2. Progressed and widespread cerebral white matter signal abnormality since 2006, favor chronic small vessel disease related. Chronic left thalamic lacunar infarct. 3.  The examination had to be discontinued prior to completion.   Scheduled Meds: . aspirin  300 mg Rectal Daily   Or  . aspirin  325 mg Oral Daily  . chlorhexidine  15 mL Mouth Rinse BID  . enoxaparin (LOVENOX) injection  40 mg Subcutaneous Q24H  . famotidine (PEPCID) IV  20 mg Intravenous Q12H  . feeding supplement  1 Container Oral BID BM  . insulin aspart  0-9 Units Subcutaneous Q4H  . levothyroxine  50 mcg Intravenous Daily  . mouth rinse  15 mL Mouth Rinse q12n4p  . pravastatin  40 mg Oral QPM   Continuous Infusions: . sodium chloride Stopped (01/02/16 1440)     LOS: 5 days    Time spent: 15 minutes  Greater than 50% of the time spent on counseling and coordinating the care.   Manson Passey, MD [facility] Pager [phone]  If 7PM-7AM, please contact night-coverage [email or web address] Password [email or web address] 01/03/2016, 11:42 AM

## 2016-01-04 LAB — GLUCOSE, CAPILLARY
GLUCOSE-CAPILLARY: 145 mg/dL — AB (ref 65–99)
GLUCOSE-CAPILLARY: 109 mg/dL — AB (ref 65–99)
GLUCOSE-CAPILLARY: 126 mg/dL — AB (ref 65–99)
Glucose-Capillary: 211 mg/dL — ABNORMAL HIGH (ref 65–99)

## 2016-01-04 LAB — CBC
HCT: 30.3 % — ABNORMAL LOW (ref 36.0–46.0)
HEMOGLOBIN: 9.2 g/dL — AB (ref 12.0–15.0)
MCH: 27 pg (ref 26.0–34.0)
MCHC: 30.4 g/dL (ref 30.0–36.0)
MCV: 88.9 fL (ref 78.0–100.0)
PLATELETS: 200 10*3/uL (ref 150–400)
RBC: 3.41 MIL/uL — AB (ref 3.87–5.11)
RDW: 17.7 % — ABNORMAL HIGH (ref 11.5–15.5)
WBC: 5.1 10*3/uL (ref 4.0–10.5)

## 2016-01-04 LAB — VAS US CAROTID
LCCAPDIAS: 10 cm/s
LCCAPSYS: 55 cm/s
LEFT ECA DIAS: -1 cm/s
Left CCA dist dias: 11 cm/s
Left CCA dist sys: 63 cm/s
Left ICA dist dias: -34 cm/s
Left ICA dist sys: -77 cm/s
Left ICA prox dias: -35 cm/s
Left ICA prox sys: -78 cm/s
RCCADSYS: -97 cm/s
RCCAPDIAS: 4 cm/s
RCCAPSYS: 31 cm/s
RIGHT ECA DIAS: -4 cm/s

## 2016-01-04 MED ORDER — ASPIRIN 325 MG PO TABS: 325.0000 mg | ORAL_TABLET | Freq: Every day | ORAL | 0 refills | Status: DC

## 2016-01-04 MED ORDER — INSULIN ASPART 100 UNIT/ML ~~LOC~~ SOLN: 0.0000 [IU] | SUBCUTANEOUS | 11 refills | Status: DC

## 2016-01-04 MED ORDER — METOCLOPRAMIDE HCL 5 MG PO TABS: 5.0000 mg | ORAL_TABLET | Freq: Four times a day (QID) | ORAL | 3 refills | Status: DC | PRN

## 2016-01-04 NOTE — Progress Notes (Signed)
Physical Therapy Treatment Patient Details Name: [name] MRN: [id number] DOB: 06/21/1923 Today's Date: 01/04/2016    History of Present Illness pt is a 82 y/o female with h/o TIA, PVD with L BKA, CKD4, HTN CAD, dHF and DM admitted to ED with acute mental status changes with slurred speech aphasia, right sided weakness and facial droop.  MRI pending, first attempt failed.    PT Comments    Progressing slowly, but pt is responding more to the therapists questions and commands.  Working mostly at [employer] on truncal activation, midline balance, righting reactions to external stimuli and redirecting attention to task.   Follow Up Recommendations  SNF     Equipment Recommendations  None recommended by PT    Recommendations for Other Services       Precautions / Restrictions Precautions Precautions: Fall Restrictions Weight Bearing Restrictions: No    Mobility  Bed Mobility Overal bed mobility: Needs Assistance;+2 for physical assistance Bed Mobility: Rolling;Sidelying to Sit           General bed mobility comments: pt did not initiate any movement for roll or transition to sit  Transfers Overall transfer level: Needs assistance Equipment used: 1 person hand held assist Transfers: Squat Pivot Transfers (modified quad lift)     Squat pivot transfers: Total assist;+2 safety/equipment        Ambulation/Gait             General Gait Details: not applicable   Stairs            Wheelchair Mobility    Modified Rankin (Stroke Patients Only) Modified Rankin (Stroke Patients Only) Pre-Morbid Rankin Score: Severe disability Modified Rankin: Severe disability     Balance Overall balance assessment: Needs assistance   Sitting balance-Leahy Scale: Poor (progressing toward fair) Sitting balance - Comments: sat EOB >12 min working on truncal activation and balance with pt's light bil UE assist  and light R LE assist..  Pt able to hole approx midline for  5-10 sec before starting to bob/wobble from all outer edge of her BOS and then finally fatigues and falls over                            Cognition Arousal/Alertness: Lethargic Behavior During Therapy: Flat affect;WFL for tasks assessed/performed Overall Cognitive Status: Difficult to assess                      Exercises      General Comments        Pertinent Vitals/Pain      Home Living                      Prior Function            PT Goals (current goals can now be found in the care plan section) Acute Rehab PT Goals PT Goal Formulation: Patient unable to participate in goal setting Time For Goal Achievement: 01/12/16 Potential to Achieve Goals: Fair Progress towards PT goals: Progressing toward goals    Frequency    Min 3X/week      PT Plan Current plan remains appropriate    Co-evaluation             End of Session   Activity Tolerance: Patient limited by fatigue Patient left: in chair;with call bell/phone within reach;with chair alarm set     Time: 1400-1429 PT Time Calculation (min) (ACUTE ONLY):  29 min  Charges:  $Therapeutic Activity: 23-37 mins                    G Codes:      Chirag Krueger, Eliseo Gum 01/04/2016, 2:47 PM 01/04/2016  [place], PT [phone]  (pager)

## 2016-01-04 NOTE — Plan of Care (Signed)
Problem: Health Behavior/Discharge Planning: Goal: Ability to manage health-related needs will improve Outcome: Progressing Social work working on [employer].

## 2016-01-04 NOTE — Progress Notes (Signed)
Speech Language Pathology Treatment: Dysphagia;Cognitive-Linquistic  Patient Details Name: [name] MRN: [id number] DOB: [birth year] Today's Date: 01/04/2016 Time: 1010-1029 SLP Time Calculation (min) (ACUTE ONLY): 19 min  Assessment / Plan / Recommendation Clinical Impression  Pt tolerating thin liquids per staff and via SLP observation. Pt consumed 8 oz of ice water via straw without signs of aspiration. Also tolerated one bite of puree with prolonged oral pumping and then 2 oz of ice cream with normal oral transit. She is apparently very picky about foods. SLP utilized context to elicits sustained attention and functional communication. Pt required max cues to select between two items via yes and no, but independently made fluent phrase length request for water and relief from neck pain. Recommend pt upgrade to dys 1 puree texture with  Thin liquids with basic esophageal precautions - follow bites with sips, keep upright after meals.    HPI HPI: 82 yo female adm to [facility] with right sided weakness, speech deficits, right facial droop.  Found to have left MCA/PCA CVA.  Pt with right inattention, dysphagia, aphasia.  She has h/o esophageal deficits - including dysmotility and diverticulum 11/09/2015.        SLP Plan  Continue with current plan of care     Recommendations  Diet recommendations: Dysphagia 1 (puree);Thin liquid Liquids provided via: Straw;Cup Medication Administration: Whole meds with liquid Supervision: Staff to assist with self feeding;Full supervision/cueing for compensatory strategies Compensations: Slow rate;Small sips/bites;Follow solids with liquid Postural Changes and/or Swallow Maneuvers: Seated upright 90 degrees;Upright 30-60 min after meal                Plan: Continue with current plan of care       GO                Wei Poplaski, Riley Nearing 01/04/2016, 10:59 AM

## 2016-01-04 NOTE — Telephone Encounter (Signed)
Rx has been sent to the pharmacy electronically. ° °

## 2016-01-04 NOTE — Discharge Instructions (Signed)
Aspirin capsules or tablets extended release °What is this medicine? °ASPIRIN (AS pir in) is a pain reliever. It is used to treat mild pain and fever. This medicine is also used as directed by a doctor to prevent and to treat heart attacks, to prevent strokes, and to treat arthritis or inflammation. °This medicine may be used for other purposes; ask your health care provider or pharmacist if you have questions. °What should I tell my health care provider before I take this medicine? °They need to know if you have any of these conditions: °-anemia °-asthma °-bleeding problems °-child with chickenpox, the flu, or other viral infection °-diabetes °-gout °-if you frequently drink alcohol containing drinks °-kidney disease °-liver disease °-low level of vitamin K °-lupus °-smoke tobacco °-stomach ulcers or other problems °-an unusual or allergic reaction to aspirin, tartrazine dye, other medicines, dyes, or preservatives °-pregnant or trying to get pregnant °-breast-feeding °How should I use this medicine? °Take this medicine by mouth with a glass of water. Follow the directions on the package or prescription label. Do not chew, crush, or cut this medicine. You can take this medicine with or without food. If it upsets your stomach, take it with food. Do not take your medicine more often than directed. °Talk to your pediatrician regarding the use of this medicine in children. While this drug may be prescribed for children as young as 12 years of age for selected conditions, precautions do apply. Children and teenagers should not use this medicine to treat chicken pox or flu symptoms unless directed by a doctor. °Patients over 65 years old may have a stronger reaction and need a smaller dose. °Overdosage: If you think you have taken too much of this medicine contact a poison control center or emergency room at once. °NOTE: This medicine is only for you. Do not share this medicine with others. °What if I miss a dose? °If  you are taking this medicine on a regular schedule and miss a dose, take it as soon as you can. If it is almost time for your next dose, take only that dose. Do not take double or extra doses. °What may interact with this medicine? °Do not take this medicine with any of the following medications: °-cidofovir °-ketorolac °-probenecid °This medicine may also interact with the following medications: °-alcohol °-alendronate °-bismuth subsalicylate °-flavocoxid °-herbal supplements like feverfew, garlic, ginger, ginkgo biloba, horse chestnut °-medicines for diabetes or glaucoma like acetazolamide, methazolamide °-medicines for gout °-medicines that treat or prevent blood clots like enoxaparin, heparin, ticlopidine, warfarin °-other aspirin and aspirin-like medicines °-NSAIDs, medicines for pain and inflammation, like ibuprofen or naproxen °-pemetrexed °-sulfinpyrazone °-varicella live vaccine °This list may not describe all possible interactions. Give your health care provider a list of all the medicines, herbs, non-prescription drugs, or dietary supplements you use. Also tell them if you smoke, drink alcohol, or use illegal drugs. Some items may interact with your medicine. °What should I watch for while using this medicine? °If you are treating yourself for pain, tell your doctor or health care professional if the pain lasts more than 10 days, if it gets worse, or if there is a new or different kind of pain. Tell your doctor if you see redness or swelling. Also, check with your doctor if you have a fever that lasts for more than 3 days. Only take this medicine to prevent heart attacks or blood clotting if prescribed by your doctor or health care professional. °Do not take aspirin or aspirin-like medicines   with this medicine. Too much aspirin can be dangerous. Always read the labels carefully. °This medicine can irritate your stomach or cause bleeding problems. Do not smoke cigarettes or drink alcohol while taking this  medicine. Do not lie down for 30 minutes after taking this medicine to prevent irritation to your throat. °If you are scheduled for any medical or dental procedure, tell your healthcare provider that you are taking this medicine. You may need to stop taking this medicine before the procedure. °This medicine may be used to treat migraines. If you take migraine medicines for 10 or more days a month, your migraines may get worse. Keep a diary of headache days and medicine use. Contact your healthcare professional if your migraine attacks occur more frequently. °What side effects may I notice from receiving this medicine? °Side effects that you should report to your doctor or health care professional as soon as possible: °-allergic reactions like skin rash, itching or hives, swelling of the face, lips, or tongue °-breathing problems °-changes in hearing, ringing in the ears °-confusion °-general ill feeling or flu-like symptoms °-pain on swallowing °-redness, blistering, peeling or loosening of the skin, including inside the mouth or nose °-signs and symptoms of bleeding such as bloody or black, tarry stools; red or dark-brown urine; spitting up blood or brown material that looks like coffee grounds; red spots on the skin; unusual bruising or bleeding from the eye, gums, or nose °-trouble passing urine or change in the amount of urine °-unusually weak or tired °-yellowing of the eyes or skin °Side effects that usually do not require medical attention (report to your doctor or health care professional if they continue or are bothersome): °-diarrhea or constipation °-headache °-nausea, vomiting °-stomach gas, heartburn °This list may not describe all possible side effects. Call your doctor for medical advice about side effects. You may report side effects to FDA at 1-800-FDA-1088. °Where should I keep my medicine? °Keep out of the reach of children. °Store at room temperature between 15 and 30 degrees C (59 and 86 degrees  F). Protect from heat and moisture. Do not use this medicine if it has a strong vinegar smell. Throw away any unused medicine after the expiration date. °NOTE: This sheet is a summary. It may not cover all possible information. If you have questions about this medicine, talk to your doctor, pharmacist, or health care provider. °  °© 2016, [employer]. (2012-11-06 11:29:44) ° °

## 2016-01-04 NOTE — Care Management Note (Signed)
Case Management Note  Patient Details  Name: [name] MRN: [id number] Date of Birth: Aug 08, 1923  Subjective/Objective:                    Action/Plan: Pt discharging to SNF today. No further needs per CM.   Expected Discharge Date:                  Expected Discharge Plan:  Skilled Nursing Facility  In-House Referral:  Clinical Social Work  Discharge planning Services  CM Consult  Post Acute Care Choice:    Choice offered to:     DME Arranged:    DME Agency:     HH Arranged:    HH Agency:     Status of Service:  Completed, signed off  If discussed at [employer], dates discussed:    Additional Comments:  Kermit Balo, RN 01/04/2016, 11:22 AM

## 2016-01-04 NOTE — Progress Notes (Signed)
RN notified that patient moved from 5C15 to 5C20. No issues noted.

## 2016-01-04 NOTE — Discharge Summary (Addendum)
Physician Discharge Summary  [name] ZOX:[id number] DOB: [birth year] DOA: 12/28/2015  PCP: Georgianne Fick, MD  Admit date: 12/28/2015  Discharge date: 01/05/2016  Recommendations for Outpatient Follow-up:  1. Continue aspirin daily   Discharge Diagnoses:  Principal Problem:   Acute right-sided weakness Active Problems:   CAD - moderate at cath July 2012 (medical Rx)   Hypothyroid   Chronic renal insufficiency, stage IV (severe)- HD started 01/05/13   Diabetes mellitus type 2 with peripheral artery disease (HCC)   Altered mental state   Cerebral thrombosis with cerebral infarction   Cerebrovascular accident (CVA) involving left middle cerebral artery territory [facility])   Aphasia following other cerebrovascular disease   Hemi-neglect of right side   Altered mental status   Goals of care, counseling/discussion   Palliative care by specialist   Palliative care encounter    Discharge Condition: stable   Diet recommendation: as tolerated   History of present illness:  82 y.o.woman with a history of prior TIA, Left BKA, PVD, CKD IV (she is not a dialysis patient), HTN, HLD, CAD, chronic diastolic heart failure, and DM who presented to [facility] ED with worsening mental status changes including slurred speech, aphasia, right sided weakness and right sided facial droop.  CODE STROKE was called upon arrival. Neurology has seen thepatient in consultation . She was not felt to be a tPA candidate due to inconsistent physical exam findings and concerns for acute encephalopathy. Head CT was negative for acute process (though chronic changes were noted). Chest xray was negative for acute process.   Hospital Course:   Assessment & Plan:   Principal Problem:   Acute right-sided weakness / Dominant posterior left MCA and MCA/PCA watershed infarct  / Acute encephalopathy  - Likely large vessel disease due to multiple stroke risk factors including peripheral vascular disease and amputation,  hypertension, dyslipidemia, coronary artery disease, diabetes, CHF - Patient with posterior left MCA and MCA/PCA infarct with global aphasia and right hemiplegia and right neglect - Continue aspirin daily; per neurology okay to continue aspirin instead of plavix  - MRI brain showed acute posterior left MCA or MCA/PCA watershed infarct with no associated hemorrhage or mass effect. Progressed and widespread cerebral white matter signal abnormality since 2006, favor chronic small vessel disease. Chronic left thalamic lacunar infarct. - Carotid doppler showed bilateral: 1-39% ICA stenosis. Unable to obtain vertebral artery images bilaterally due to constant movement of the head  - EEG showed diffuse slowing, no seizures  - ECHO showed EF 30%. Moderate diffuse hypokinesis with distinct regional wall motion abnormalities. Severe hypokinesis of the anteroseptal, anterior, and apical myocardium; consistent with ischemia in the distribution of the left anterior descending coronary artery. Possible dyskinesis of the apical myocardium. Hypokinesis of the mid-apicalanterolateral and inferolateral myocardium; consistent with ischemia in the distribution of the left circumflex coronary artery. Due to tachycardia, there was fusion of early and atrial contributions to ventricular filling. Doppler parameters are consistent with restrictive physiology, indicative of decreased left ventricular diastolic compliance and/or increased left atrial pressure. Acoustic contrast opacification revealed no evidence ofthrombus. - A1c is 6.9 - LDL is 188, goal is less than 100. Pt not yet on statin, will start stain therapy once passes swallow screen - Per PT - SNF placement - Palliative care following   Active Problems:   CAD - moderate at cath July 2012 (medical Rx) - Continue aspirin daily     Dyslipidemia associated with type 2 DM  - Continue statin therapy     Hypothyroidism -  Continue synthroid     Hypokalemia -  Supplemented     Essential hypertension - Continue norvasc, hydralazine, imdur     Chronic renal insufficiency, stage IV  - Cr 1.9 at baseline - Cr on this admission 2, within baseline range     Diabetes mellitus type 2 with peripheral artery disease and diabetic nephropathy and long term insulin use (HCC) - Continue SSI on discharge - CBG's in past 24 hours: 140, 145, 109   DVT prophylaxis: Lovenox subQ Code Status: full code  Family Communication: family not at the bedside this am   Consultants:   Neurology  Nutrition  SLP  PT  PCT   Procedures:   EEG - diffuse cerebral dysfunction nonspecific   Carotid doppler - Bilateral: 1-39% ICA stenosis. Unable to obtain vertebral artery images bilaterally due to constant movement of the head   2 D ECHO 01/01/2016 - showed EF 30%. Moderate diffuse hypokinesis with distinct regional wall motion abnormalities. Severe hypokinesis of the anteroseptal, anterior, and apical myocardium; consistent with ischemia in the distribution of the left anterior descending coronary artery. Possible dyskinesis of the apical myocardium. Hypokinesis of the mid-apicalanterolateral and inferolateral myocardium; consistent with ischemia in the distribution of the left circumflex coronary artery. Due to tachycardia, there was fusion of early and atrial contributions to ventricular filling. Doppler parameters are consistent with restrictive physiology, indicative of decreased left ventricular diastolic compliance and/or increased left atrial pressure. Acoustic contrast opacification revealed no evidence ofthrombus.  Antimicrobials:   None     Signed:  Manson Passey, MD  [facility] 01/04/2016, 10:15 AM  Pager #: [phone]  Time spent in minutes: less than 30 minutes  Discharge Exam: Vitals:   01/04/16 0515 01/04/16 0617  BP: (!) 183/89 (!) 179/80  Pulse: (!) 106 97  Resp: 20 18  Temp: 97.8 F (36.6 C)    Vitals:    01/03/16 2159 01/04/16 0055 01/04/16 0515 01/04/16 0617  BP: (!) 180/88 (!) 184/85 (!) 183/89 (!) 179/80  Pulse: (!) 106 97 (!) 106 97  Resp: 18 20 20 18   Temp:  97.8 F (36.6 C) 97.8 F (36.6 C)   TempSrc:  Oral Oral   SpO2: 99% 100% 100% 97%  Weight:      Height:        General: Pt is alert, follows commands appropriately, not in acute distress Cardiovascular: Regular rate and rhythm, S1/S2 +, no murmurs Respiratory: Clear to auscultation bilaterally, no wheezing, no crackles, no rhonchi Abdominal: Soft, non tender, non distended, bowel sounds +, no guarding Extremities: no edema, no cyanosis, pulses palpable bilaterally DP and PT Neuro: Grossly nonfocal  Discharge Instructions  Discharge Instructions    Call MD for:  difficulty breathing, headache or visual disturbances    Complete by:  As directed    Call MD for:  persistant nausea and vomiting    Complete by:  As directed    Call MD for:  redness, tenderness, or signs of infection (pain, swelling, redness, odor or green/yellow discharge around incision site)    Complete by:  As directed    Call MD for:  severe uncontrolled pain    Complete by:  As directed    Diet - low sodium heart healthy    Complete by:  As directed    Increase activity slowly    Complete by:  As directed         Medication List    STOP taking these medications   aspirin EC 81  MG tablet Replaced by:  aspirin 325 MG tablet   clopidogrel 75 MG tablet Commonly known as:  PLAVIX   furosemide 80 MG tablet Commonly known as:  LASIX   gabapentin 300 MG capsule Commonly known as:  NEURONTIN   HYDROcodone-acetaminophen 5-325 MG tablet Commonly known as:  NORCO/VICODIN   insulin glargine 100 UNIT/ML injection Commonly known as:  LANTUS     TAKE these medications   acetaminophen 325 MG tablet Commonly known as:  TYLENOL Take 650 mg by mouth every 6 (six) hours as needed for mild pain.   albuterol-ipratropium 18-103 MCG/ACT  inhaler Commonly known as:  COMBIVENT Inhale 2 puffs into the lungs at bedtime as needed for wheezing or shortness of breath.   allopurinol 100 MG tablet Commonly known as:  ZYLOPRIM Take 200 mg by mouth daily.   amLODipine 10 MG tablet Commonly known as:  NORVASC Take 1 tablet (10 mg total) by mouth daily.   aspirin 325 MG tablet Take 1 tablet (325 mg total) by mouth daily. Replaces:  aspirin EC 81 MG tablet   calcitRIOL 0.25 MCG capsule Commonly known as:  ROCALTROL Take 0.25 mcg by mouth every other day.   calcium-vitamin D 500-200 MG-UNIT tablet Take 1 tablet by mouth daily.   docusate sodium 100 MG capsule Commonly known as:  COLACE Take 100 mg by mouth daily as needed for mild constipation.   famotidine 20 MG tablet Commonly known as:  PEPCID Take 1 tablet (20 mg total) by mouth at bedtime.   feeding supplement Liqd Take 1 Container by mouth 3 (three) times daily between meals.   hydrALAZINE 50 MG tablet Commonly known as:  APRESOLINE Take 1 tablet (50 mg total) by mouth 2 (two) times daily.   insulin aspart 100 UNIT/ML injection Commonly known as:  novoLOG Inject 0-9 Units into the skin every 4 (four) hours.   isosorbide mononitrate 60 MG 24 hr tablet Commonly known as:  IMDUR Take 60 mg by mouth daily.   levothyroxine 100 MCG tablet Commonly known as:  SYNTHROID, LEVOTHROID Take 1 tablet (100 mcg total) by mouth daily before breakfast.   metoCLOPramide 5 MG tablet Commonly known as:  REGLAN Take 1 tablet (5 mg total) by mouth every 6 (six) hours as needed for nausea. What changed:  when to take this  reasons to take this   nitroGLYCERIN 0.4 MG SL tablet Commonly known as:  NITROSTAT Place 0.4 mg under the tongue every 5 (five) minutes as needed for chest pain.   pravastatin 40 MG tablet Commonly known as:  PRAVACHOL Take 40 mg by mouth every evening.   RENVELA 800 MG tablet Generic drug:  sevelamer carbonate Take 800 mg by mouth 3 (three)  times daily with meals.   sodium bicarbonate 650 MG tablet Take 1 tablet (650 mg total) by mouth daily.          Follow-up Information    RAMACHANDRAN,AJITH, MD. Schedule an appointment as soon as possible for a visit in 1 week(s).   Specialty:  Internal Medicine Contact information: [address] [place] [address] [phone]            The results of significant diagnostics from this hospitalization (including imaging, microbiology, ancillary and laboratory) are listed below for reference.    Significant Diagnostic Studies: Dg Chest 2 View  Result Date: 12/29/2015 CLINICAL DATA:  82 y/o F; altered mental status and possible stroke. EXAM: CHEST  2 VIEW COMPARISON:  06/22/2014 chest radiograph. FINDINGS:  Stable cardiomegaly given projection and technique. Aortic atherosclerosis with arch calcifications. Bibasilar reticular opacities may represent scarring or atelectasis. No focal consolidation. No pneumothorax. No pleural effusion. No acute osseous abnormality is identified. IMPRESSION: No active cardiopulmonary disease. Electronically Signed   By: Mitzi Hansen M.D.   On: 12/29/2015 01:03   Ct Head Wo Contrast  Result Date: 12/28/2015 CLINICAL DATA:  Code stroke, with right-sided hemineglect and altered mental status. Initial encounter. EXAM: CT HEAD WITHOUT CONTRAST TECHNIQUE: Contiguous axial images were obtained from the base of the skull through the vertex without intravenous contrast. COMPARISON:  CT of the head performed 06/15/2013 FINDINGS: Brain: No evidence of acute infarction, hemorrhage, extra-axial collection or mass lesion/mass effect. There is diffuse prominence of the supratentorial ventricles, increased from the prior study, with suggestion of underlying transependymal resorption of CSF, concerning for mild hydrocephalus. Underlying mild cortical volume loss is noted. Mild cerebellar atrophy is noted. Scattered periventricular and  subcortical white matter change likely reflects small vessel ischemic microangiopathy. The brainstem and fourth ventricle are within normal limits. The basal ganglia are unremarkable in appearance. The cerebral hemispheres demonstrate grossly normal gray-white differentiation. No mass effect or midline shift is seen. Vascular: No hyperdense vessel or unexpected calcification. Skull: There is no evidence of fracture; visualized osseous structures are unremarkable in appearance. Sinuses/Orbits: The orbits are within normal limits. The paranasal sinuses and mastoid air cells are well-aerated. Other: No significant soft tissue abnormalities are seen. IMPRESSION: 1. No acute intracranial pathology seen on CT. 2. Increased diffuse prominence of the supratentorial ventricles, with suggestion of underlying transependymal resorption of CSF, concerning for mild hydrocephalus. 3. Underlying mild cortical volume loss and scattered small vessel ischemic microangiopathy noted. These results were called by telephone at the time of interpretation on 12/28/2015 at 10:46 pm to Dr. Roxy Manns, who verbally acknowledged these results. Electronically Signed   By: Roanna Raider M.D.   On: 12/28/2015 22:49   Mr Brain Wo Contrast  Result Date: 12/29/2015 CLINICAL DATA:  82 year old female with acute slurred speech, abnormal speech, decreased mental status, right side weakness and facial droop. Initial encounter. EXAM: MRI HEAD WITHOUT CONTRAST TECHNIQUE: Multiplanar, multiecho pulse sequences of the brain and surrounding structures were obtained without intravenous contrast. COMPARISON:  Head CT without contrast 11/29/2015. Brain MRI 07/05/2004. FINDINGS: The examination had to be discontinued prior to completion due to altered mental status; the ordering provider did not feel the patient was a candidate for MRI under sedation. Axial diffusion, T2, T1, and FLAIR imaging as well as coronal diffusion only was obtained. Brain: 3 cm area of  cortical and white matter restricted diffusion in the inferior posterior left parietal and the lateral left occipital lobe. No other restricted diffusion identified. Minimal to mild associated parenchymal T2 and FLAIR hyperintensity. No associated mass effect. No evidence of malignant hemorrhagic transformation. Progressed since 2006 and now confluent bilateral cerebral white matter T2 and FLAIR hyperintensity. Ventricular enlargement which is favored to be ex vacuo in nature. No midline shift or intracranial mass effect. No definite cortical encephalomalacia. Chronic left thalamic lacunar infarct. Vascular: Major intracranial vascular flow voids are grossly stable. Skull and upper cervical spine: Not well evaluated. Sinuses/Orbits: Visualized paranasal sinuses and mastoids are stable and well pneumatized. Stable postoperative changes to the globes. Other: None. IMPRESSION: 1. Acute posterior left MCA or MCA/PCA watershed infarct with no associated hemorrhage or mass effect. 2. Progressed and widespread cerebral white matter signal abnormality since 2006, favor chronic small vessel disease related. Chronic left thalamic lacunar  infarct. 3.  The examination had to be discontinued prior to completion. Electronically Signed   By: Odessa Fleming M.D.   On: 12/29/2015 11:26   Dg Chest Port 1 View  Result Date: 12/31/2015 CLINICAL DATA:  Wheezing today EXAM: PORTABLE CHEST 1 VIEW COMPARISON:  12/28/2015 FINDINGS: Stable cardiopericardial enlargement. Increased basilar opacities, asymmetric to the right. There is diffuse interstitial coarsening. No effusion or pneumothorax. IMPRESSION: New findings of CHF. Cannot exclude superimposed pneumonia at the bases. Electronically Signed   By: Marnee Spring M.D.   On: 12/31/2015 08:45    Microbiology: No results found for this or any previous visit (from the past 240 hour(s)).   Labs: Basic Metabolic Panel:  Recent Labs Lab 12/28/15 2226 12/28/15 2231 12/31/15 0319  01/03/16 0500  NA 139 139 142 146*  K 3.4* 3.4* 4.2 4.7  CL 103 104 114* 119*  CO2 22  --  16* 12*  GLUCOSE 161* 156* 137* 136*  BUN 11 16 13 20   CREATININE 2.12* 2.00* 2.07* 2.22*  CALCIUM 8.9  --  8.2* 8.9   Liver Function Tests:  Recent Labs Lab 12/28/15 2226  AST 34  ALT 19  ALKPHOS 66  BILITOT 0.4  PROT 6.3*  ALBUMIN 3.3*   No results for input(s): LIPASE, AMYLASE in the last 168 hours. No results for input(s): AMMONIA in the last 168 hours. CBC:  Recent Labs Lab 12/28/15 2226 12/28/15 2231 01/03/16 0500 01/04/16 0324  WBC 6.4  --  6.1 5.1  NEUTROABS 3.3  --   --   --   HGB 10.8* 12.2 10.2* 9.2*  HCT 35.1* 36.0 33.6* 30.3*  MCV 90.2  --  91.1 88.9  PLT 299  --  158 200   Cardiac Enzymes: No results for input(s): CKTOTAL, CKMB, CKMBINDEX, TROPONINI in the last 168 hours. BNP: BNP (last 3 results) No results for input(s): BNP in the last 8760 hours.  ProBNP (last 3 results) No results for input(s): PROBNP in the last 8760 hours.  CBG:  Recent Labs Lab 01/03/16 1603 01/03/16 2001 01/03/16 2304 01/04/16 0325 01/04/16 0803  GLUCAP 103* 102* 140* 145* 109*

## 2016-01-04 NOTE — Care Management Important Message (Signed)
Important Message  Patient Details  Name: [name] MRN: [id number] Date of Birth: 09/17/1923   Medicare Important Message Given:  Yes    Isador Castille Abena 01/04/2016, 1:55 PM

## 2016-01-05 LAB — GLUCOSE, CAPILLARY
Glucose-Capillary: 139 mg/dL — ABNORMAL HIGH (ref 65–99)
Glucose-Capillary: 139 mg/dL — ABNORMAL HIGH (ref 65–99)
Glucose-Capillary: 129 mg/dL — ABNORMAL HIGH (ref 65–99)
Glucose-Capillary: 128 mg/dL — ABNORMAL HIGH (ref 65–99)

## 2016-01-05 MED ORDER — HYDRALAZINE HCL 20 MG/ML IJ SOLN: 10.0000 mg | Freq: Once | INTRAMUSCULAR | Status: DC

## 2016-01-05 MED ORDER — HYDRALAZINE HCL 50 MG PO TABS
50.0000 mg | ORAL_TABLET | Freq: Once | ORAL | Status: AC
  Administered 2016-01-05: 50 mg via ORAL
  Filled 2016-01-05: qty 1

## 2016-01-05 NOTE — Progress Notes (Signed)
Patient seen and examined at the bedside. Patient's son at the bedside this morning. We spoke about discharge plan today, awaiting insurance authorization. Patient is medically stable for discharge to skilled nursing facility. No changes in medical management since yesterday. Please refer to discharge summary completed 01/04/2016.  Manson Passey [facility] [phone]

## 2016-01-05 NOTE — Progress Notes (Signed)
Patient A/O, no noted distress. Patient follows commands. Educated patient on medication, she tolerated well. Patient is able to move BUE/BLE. She followed all directions during assessment with some motivation and encourage from son and Clinical research associate. She is able to make some needs known. Staff will continue to monitor and meet needs. Educated son on s/s of stroke and what he do in the event he notice any signs "FAST."

## 2016-01-05 NOTE — Clinical Social Work Placement (Signed)
   CLINICAL SOCIAL WORK PLACEMENT  NOTE 01/05/16 - DISCHARGED TO [facility]  Date:  01/05/2016  Patient Details  Name: [name] MRN: [id number] Date of Birth: 1923-04-20  Clinical Social Work is seeking post-discharge placement for this patient at the Skilled  Nursing Facility level of care (*CSW will initial, date and re-position this form in  chart as items are completed):  Yes   Patient/family provided with [facility] Clinical Social Work Department's list of facilities offering this level of care within the geographic area requested by the patient (or if unable, by the patient's family).  Yes   Patient/family informed of their freedom to choose among providers that offer the needed level of care, that participate in Medicare, Medicaid or managed care program needed by the patient, have an available bed and are willing to accept the patient.  Yes   Patient/family informed of [place]'s ownership interest in [facility] and [facility], as well as of the fact that they are under no obligation to receive care at these facilities.  PASRR submitted to EDS on       PASRR number received on 01/01/16     Existing PASRR number confirmed on 12/30/15     FL2 transmitted to all facilities in geographic area requested by pt/family on 01/02/16     FL2 transmitted to all facilities within larger geographic area on       Patient informed that his/her managed care company has contracts with or will negotiate with certain facilities, including the following:        Yes   Patient/family informed of bed offers received.  Patient chooses bed at [facility]     Physician recommends and patient chooses bed at      Patient to be transferred to [facility] on 01/05/16.  Patient to be transferred to facility by Ambulance     Patient family notified on 01/05/16 of transfer.  Name of family member notified:  Son Juliane Lack, Sister  Cristy Friedlander at the bedside     PHYSICIAN       Additional Comment:    _______________________________________________ Cristobal Goldmann, LCSW 01/05/2016, 9:48 PM

## 2016-01-05 NOTE — Progress Notes (Signed)
Report called to [facility]. Transportation notified.  Family notified. IV removed.

## 2016-01-05 NOTE — Progress Notes (Signed)
Daily Progress Note   Patient Name: [name]       Date: 01/05/2016 DOB: 08/22/1923  Age: 82 y.o. MRN#: [id number] Attending Physician: Shannon MurrayAlma M Devine, MD Primary Care Physician: Georgianne FickAMACHANDRAN,AJITH, MD Admit Date: 12/28/2015  Reason for Consultation/Follow-up: Establishing goals of care and Psychosocial/spiritual support  Subjective: Evaluated patient with son, [name] at bedside.Patient eating lunch, being fed by [name]. Tracks and smiles. Nods head to yes and no questions. Per [name] she is talking more, but did not verbalize during evaluation. Attempted to contact [facility] via telephone but she was not available and voicemailbox was full. Current plan is for patient to DC to SNF for rehab with goal of returning home with live with son, [name]. [name] is comfortable with this plan.    Review of Systems  Unable to perform ROS: Patient nonverbal   Length of Stay: 7  Current Medications: Scheduled Meds:  . aspirin  300 mg Rectal Daily   Or  . aspirin  325 mg Oral Daily  . chlorhexidine  15 mL Mouth Rinse BID  . enoxaparin (LOVENOX) injection  30 mg Subcutaneous Q24H  . famotidine (PEPCID) IV  20 mg Intravenous Q12H  . feeding supplement  1 Container Oral BID BM  . insulin aspart  0-9 Units Subcutaneous Q4H  . levothyroxine  50 mcg Intravenous Daily  . mouth rinse  15 mL Mouth Rinse q12n4p  . pravastatin  40 mg Oral QPM    Continuous Infusions: . sodium chloride Stopped (01/02/16 1440)    PRN Meds: acetaminophen **OR** acetaminophen, hydrALAZINE, levalbuterol  Physical Exam  Constitutional: She appears well-nourished.  Pulmonary/Chest:  Increased effort, no on [place] o2 2L/min  Neurological:  Awake, eating pureed foods and taking sips of liquids, nonverbal on exam, but verbal  per report of son  Skin: Skin is warm.  Psychiatric:  Pleasant, calm  Nursing note and vitals reviewed.           Vital Signs: BP (!) 162/84 (BP Location: Left Arm)   Pulse (!) 104   Temp 97.6 F (36.4 C) (Axillary)   Resp 18   Ht 5' (1.524 m) Comment: from previous encounter  Wt 54.9 kg (121 lb) Comment: from previous encounter  SpO2 99%   BMI 23.63 kg/m  SpO2: SpO2: 99 % O2 Device: O2 Device: Nasal Cannula  O2 Flow Rate: O2 Flow Rate (L/min): 2 L/min  Intake/output summary:   Intake/Output Summary (Last 24 hours) at 01/05/16 1241 Last data filed at 01/05/16 1228  Gross per 24 hour  Intake              220 ml  Output                0 ml  Net              220 ml   LBM: Last BM Date: 12/31/15 Baseline Weight: Weight: 54.9 kg (121 lb) (from previous encounter) Most recent weight: Weight: 54.9 kg (121 lb) (from previous encounter)       Palliative Assessment/Data: PPS: 30%   Flowsheet Rows   Flowsheet Row Most Recent Value  Intake Tab  Referral Department  Hospitalist  Unit at Time of Referral  Med/Surg Unit  Palliative Care Primary Diagnosis  Neurology  Date Notified  12/31/15  Palliative Care Type  New Palliative care  Reason for referral  Clarify Goals of Care  Date of Admission  12/28/15  # of days IP prior to Palliative referral  3  Clinical Assessment  Psychosocial & Spiritual Assessment  Palliative Care Outcomes      Patient Active Problem List   Diagnosis Date Noted  . Palliative care encounter   . Altered mental status   . Goals of care, counseling/discussion   . Palliative care by specialist   . Aphasia following other cerebrovascular disease   . Hemi-neglect of right side   . Cerebral thrombosis with cerebral infarction 12/30/2015  . Cerebrovascular accident (CVA) involving left middle cerebral artery [facility])   . Altered mental state 12/29/2015  . Acute right-sided weakness 12/29/2015  . Dysphagia   . FTT (failure to thrive) in adult  11/08/2015  . Nausea & vomiting 11/08/2015  . Metabolic acidosis 11/08/2015  . Hypertensive heart disease with heart failure (HCC) 10/07/2015  . Junctional bradycardia 09/18/2015  . Atypical chest pain 06/23/2014  . Neck pain   . Food impaction of esophagus   . Epiglottitis 04/13/2014  . Wound disruption, post-op, skin 02/05/2014  . Pre-operative cardiovascular examination 12/13/2013  . Foot pain 09/04/2013  . CKD (chronic kidney disease), stage IV (HCC) 09/04/2013  . Uncontrolled hypertension 08/23/2013  . Malnutrition of moderate degree (HCC) 08/21/2013  . Chronic diastolic heart failure (HCC) 08/15/2013  . Anemia of chronic disease 08/15/2013  . Hyperkalemia 08/15/2013  . 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 Dyslipidemia 01/05/2013  . Chronic renal insufficiency, stage IV (severe)- HD started 01/05/13 01/05/2013  . Diabetes mellitus type 2 with peripheral artery disease (HCC) 01/05/2013  . Peripheral vascular disease (HCC) 10/19/2011  . CAD - moderate at cath July 2012 (medical Rx) 02/08/2011  . 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 Hypothyroid 02/08/2011    Palliative Care Assessment & Plan   Patient Profile: 82 y.o. female  with past medical history of CKD IV (not dialysis candidate, CAD, uncontrolled HTN, CVA, DM, PVD (s/p BKA), chronic diastolic HF (had echo today) admitted on 12/28/2015 with mental status changes. Workup thus far revealed acute posterior MCA watershed infarct (no hemorrhage or mass effect). EEG shows diffuse slowing of waking background. Deficits , right hemiplegia and dysphagia. Now eating pureed foods and taking sips of thin liquids.    Assessment: Patient still sleeping most of the time but she is having more periods of wakefulness and is also speaking  some. She is taking sips of clear liquid and eating pureed foods that are fed to her. Continues to require 100 % assistance with ADL's. She  denies any pain. Some dyspnea continues.  Recommendations/Plan:  Continue to treat the treatable  Continue with full code.   Goal is to improve to go to a skilled rehabilitation, improve function on the right side so she can return home with her son  Goals of Care and Additional Recommendations:  Limitations on Scope of Treatment: Full Scope Treatment  Code Status:    Code Status Orders        Start     Ordered   12/29/15 0142  Full code  Continuous     12/29/15 0145    Code Status History    Date Active Date Inactive Code Status Order ID Comments User Context   11/08/2015  8:11 PM 11/13/2015  4:39 PM Full Code [id number]  Shannon Dar, NP Inpatient   06/22/2014  6:09 PM 06/23/2014  5:51 PM Full Code [id number]  Shannon Llano, MD Inpatient   04/13/2014  4:52 PM 04/17/2014  8:25 PM Full Code [id number]  Shannon Number, MD Inpatient   03/17/2014  2:24 PM 03/17/2014  6:50 PM Full Code [id number]  Shannon Hertz, MD Inpatient   08/15/2013  6:49 PM 08/23/2013  7:15 PM Full Code [id number]  Shannon Murray, MD Inpatient   06/15/2013  7:14 PM 06/17/2013  9:33 PM Full Code [id number]  Shannon Albee, MD ED   01/10/2013  5:20 PM 01/16/2013  8:43 PM DNR [id number]  Shannon Petrin, DO Inpatient   01/05/2013  1:20 AM 01/10/2013  5:20 PM Full Code [id number]  Shannon Parker, MD ED   07/05/2011 12:56 PM 07/12/2011  6:13 PM Full Code [id number]  Shannon Filter, RN Inpatient   02/08/2011  2:10 AM 02/09/2011  6:43 PM Partial Code [id number]  Shannon Carne, RN Inpatient       Prognosis:   Unable to determine  Discharge Planning:  Skilled rehab  Thank you for allowing the Palliative Medicine Team to assist in the care of this patient.   Time In: 1230 Time Out: 1245 Total Time 15 min Prolonged Time Billed  no       Greater than 50%  of this time was spent counseling and coordinating care related to the above assessment and plan.  Ocie Bob, AGNP-C Palliative Medicine  Please call  Palliative Medicine team phone with any questions [phone]. For individual providers please see AMION.

## 2016-01-16 ENCOUNTER — Emergency Department (HOSPITAL_COMMUNITY): Payer: Medicare Other

## 2016-01-16 ENCOUNTER — Inpatient Hospital Stay (HOSPITAL_COMMUNITY)
Admission: EM | Admit: 2016-01-16 | Discharge: 2016-01-20 | DRG: 291 | Disposition: A | Discharge status: Home Health | Payer: Medicare Other | Attending: Family Medicine | Admitting: Family Medicine

## 2016-01-16 ENCOUNTER — Encounter (HOSPITAL_COMMUNITY): Payer: Self-pay | Admitting: Emergency Medicine

## 2016-01-16 DIAGNOSIS — Z23 Encounter for immunization: Secondary | ICD-10-CM

## 2016-01-16 DIAGNOSIS — I252 Old myocardial infarction: Secondary | ICD-10-CM

## 2016-01-16 DIAGNOSIS — I63512 Cerebral infarction due to unspecified occlusion or stenosis of left middle cerebral artery: Secondary | ICD-10-CM | POA: Diagnosis present

## 2016-01-16 DIAGNOSIS — I272 Pulmonary hypertension, unspecified: Secondary | ICD-10-CM | POA: Diagnosis present

## 2016-01-16 DIAGNOSIS — E1151 Type 2 diabetes mellitus with diabetic peripheral angiopathy without gangrene: Secondary | ICD-10-CM | POA: Diagnosis present

## 2016-01-16 DIAGNOSIS — E039 Hypothyroidism, unspecified: Secondary | ICD-10-CM | POA: Diagnosis present

## 2016-01-16 DIAGNOSIS — Z9981 Dependence on supplemental oxygen: Secondary | ICD-10-CM

## 2016-01-16 DIAGNOSIS — N184 Chronic kidney disease, stage 4 (severe): Secondary | ICD-10-CM | POA: Diagnosis present

## 2016-01-16 DIAGNOSIS — Z794 Long term (current) use of insulin: Secondary | ICD-10-CM

## 2016-01-16 DIAGNOSIS — M109 Gout, unspecified: Secondary | ICD-10-CM | POA: Diagnosis present

## 2016-01-16 DIAGNOSIS — I130 Hypertensive heart and chronic kidney disease with heart failure and stage 1 through stage 4 chronic kidney disease, or unspecified chronic kidney disease: Principal | ICD-10-CM | POA: Diagnosis present

## 2016-01-16 DIAGNOSIS — K219 Gastro-esophageal reflux disease without esophagitis: Secondary | ICD-10-CM | POA: Diagnosis present

## 2016-01-16 DIAGNOSIS — E785 Hyperlipidemia, unspecified: Secondary | ICD-10-CM | POA: Diagnosis present

## 2016-01-16 DIAGNOSIS — Z7982 Long term (current) use of aspirin: Secondary | ICD-10-CM | POA: Diagnosis not present

## 2016-01-16 DIAGNOSIS — I2510 Atherosclerotic heart disease of native coronary artery without angina pectoris: Secondary | ICD-10-CM | POA: Diagnosis present

## 2016-01-16 DIAGNOSIS — Z89512 Acquired absence of left leg below knee: Secondary | ICD-10-CM | POA: Diagnosis not present

## 2016-01-16 DIAGNOSIS — I509 Heart failure, unspecified: Secondary | ICD-10-CM | POA: Diagnosis present

## 2016-01-16 DIAGNOSIS — Z993 Dependence on wheelchair: Secondary | ICD-10-CM

## 2016-01-16 DIAGNOSIS — I4581 Long QT syndrome: Secondary | ICD-10-CM | POA: Diagnosis present

## 2016-01-16 DIAGNOSIS — E1122 Type 2 diabetes mellitus with diabetic chronic kidney disease: Secondary | ICD-10-CM | POA: Diagnosis present

## 2016-01-16 DIAGNOSIS — I5043 Acute on chronic combined systolic (congestive) and diastolic (congestive) heart failure: Secondary | ICD-10-CM | POA: Diagnosis present

## 2016-01-16 DIAGNOSIS — I5023 Acute on chronic systolic (congestive) heart failure: Secondary | ICD-10-CM | POA: Diagnosis not present

## 2016-01-16 DIAGNOSIS — E1142 Type 2 diabetes mellitus with diabetic polyneuropathy: Secondary | ICD-10-CM | POA: Diagnosis present

## 2016-01-16 DIAGNOSIS — J9621 Acute and chronic respiratory failure with hypoxia: Secondary | ICD-10-CM | POA: Diagnosis present

## 2016-01-16 DIAGNOSIS — Z8673 Personal history of transient ischemic attack (TIA), and cerebral infarction without residual deficits: Secondary | ICD-10-CM

## 2016-01-16 DIAGNOSIS — E1121 Type 2 diabetes mellitus with diabetic nephropathy: Secondary | ICD-10-CM | POA: Diagnosis present

## 2016-01-16 DIAGNOSIS — D631 Anemia in chronic kidney disease: Secondary | ICD-10-CM | POA: Diagnosis present

## 2016-01-16 DIAGNOSIS — Z87891 Personal history of nicotine dependence: Secondary | ICD-10-CM

## 2016-01-16 DIAGNOSIS — R1310 Dysphagia, unspecified: Secondary | ICD-10-CM

## 2016-01-16 DIAGNOSIS — E782 Mixed hyperlipidemia: Secondary | ICD-10-CM | POA: Diagnosis present

## 2016-01-16 DIAGNOSIS — Z79899 Other long term (current) drug therapy: Secondary | ICD-10-CM

## 2016-01-16 DIAGNOSIS — E038 Other specified hypothyroidism: Secondary | ICD-10-CM | POA: Diagnosis present

## 2016-01-16 DIAGNOSIS — I10 Essential (primary) hypertension: Secondary | ICD-10-CM | POA: Diagnosis present

## 2016-01-16 LAB — CBC WITH DIFFERENTIAL/PLATELET
Basophils Absolute: 0 10*3/uL (ref 0.0–0.1)
Basophils Relative: 0 %
EOS PCT: 1 %
Eosinophils Absolute: 0.1 10*3/uL (ref 0.0–0.7)
HEMATOCRIT: 30.8 % — AB (ref 36.0–46.0)
Hemoglobin: 9.5 g/dL — ABNORMAL LOW (ref 12.0–15.0)
LYMPHS ABS: 1.5 10*3/uL (ref 0.7–4.0)
LYMPHS PCT: 23 %
MCH: 28.2 pg (ref 26.0–34.0)
MCHC: 30.8 g/dL (ref 30.0–36.0)
MCV: 91.4 fL (ref 78.0–100.0)
MONO ABS: 0.6 10*3/uL (ref 0.1–1.0)
MONOS PCT: 10 %
NEUTROS ABS: 4.3 10*3/uL (ref 1.7–7.7)
Neutrophils Relative %: 66 %
PLATELETS: 270 10*3/uL (ref 150–400)
RBC: 3.37 MIL/uL — ABNORMAL LOW (ref 3.87–5.11)
RDW: 19.7 % — AB (ref 11.5–15.5)
WBC: 6.6 10*3/uL (ref 4.0–10.5)

## 2016-01-16 LAB — BASIC METABOLIC PANEL
Anion gap: 10 (ref 5–15)
BUN: 19 mg/dL (ref 6–20)
CHLORIDE: 110 mmol/L (ref 101–111)
CO2: 24 mmol/L (ref 22–32)
CREATININE: 1.98 mg/dL — AB (ref 0.44–1.00)
Calcium: 8.9 mg/dL (ref 8.9–10.3)
GFR calc Af Amer: 24 mL/min — ABNORMAL LOW (ref >60)
GFR calc non Af Amer: 21 mL/min — ABNORMAL LOW (ref >60)
Glucose, Bld: 105 mg/dL — ABNORMAL HIGH (ref 65–99)
POTASSIUM: 4.5 mmol/L (ref 3.5–5.1)
SODIUM: 144 mmol/L (ref 135–145)

## 2016-01-16 LAB — PROTIME-INR
INR: 1.12
PROTHROMBIN TIME: 14.4 s (ref 11.4–15.2)

## 2016-01-16 LAB — URINALYSIS, ROUTINE W REFLEX MICROSCOPIC
Glucose, UA: NEGATIVE mg/dL
Hgb urine dipstick: NEGATIVE
KETONES UR: NEGATIVE mg/dL
Leukocytes, UA: NEGATIVE
NITRITE: NEGATIVE
PROTEIN: 100 mg/dL — AB
Specific Gravity, Urine: 1.017 (ref 1.005–1.030)
pH: 5.5 (ref 5.0–8.0)

## 2016-01-16 LAB — URINE MICROSCOPIC-ADD ON
RBC / HPF: NONE SEEN RBC/hpf (ref 0–5)
SQUAMOUS EPITHELIAL / LPF: NONE SEEN
WBC, UA: NONE SEEN WBC/hpf (ref 0–5)

## 2016-01-16 LAB — I-STAT TROPONIN, ED: Troponin i, poc: 0.05 ng/mL (ref 0.00–0.08)

## 2016-01-16 LAB — MRSA PCR SCREENING: MRSA by PCR: NEGATIVE

## 2016-01-16 LAB — BRAIN NATRIURETIC PEPTIDE: B NATRIURETIC PEPTIDE 5: 3168.6 pg/mL — AB (ref 0.0–100.0)

## 2016-01-16 LAB — GLUCOSE, CAPILLARY
GLUCOSE-CAPILLARY: 142 mg/dL — AB (ref 65–99)
GLUCOSE-CAPILLARY: 117 mg/dL — AB (ref 65–99)

## 2016-01-16 MED ORDER — METOCLOPRAMIDE HCL 5 MG PO TABS
5.0000 mg | ORAL_TABLET | Freq: Four times a day (QID) | ORAL | Status: DC | PRN
  Filled 2016-01-16: qty 1

## 2016-01-16 MED ORDER — FUROSEMIDE 10 MG/ML IJ SOLN
40.0000 mg | Freq: Two times a day (BID) | INTRAMUSCULAR | Status: DC
  Administered 2016-01-16 – 2016-01-19 (×6): 40 mg via INTRAVENOUS
  Filled 2016-01-16 (×6): qty 4

## 2016-01-16 MED ORDER — VITAMIN D (ERGOCALCIFEROL) 1.25 MG (50000 UNIT) PO CAPS
50000.0000 [IU] | ORAL_CAPSULE | ORAL | Status: DC
  Filled 2016-01-16: qty 1

## 2016-01-16 MED ORDER — ACETAMINOPHEN 325 MG PO TABS: 650.0000 mg | ORAL_TABLET | ORAL | Status: DC | PRN

## 2016-01-16 MED ORDER — SODIUM CHLORIDE 0.9% FLUSH: 3.0000 mL | INTRAVENOUS | Status: DC | PRN

## 2016-01-16 MED ORDER — PRAVASTATIN SODIUM 40 MG PO TABS
40.0000 mg | ORAL_TABLET | Freq: Every evening | ORAL | Status: DC
  Administered 2016-01-16 – 2016-01-19 (×4): 40 mg via ORAL
  Filled 2016-01-16 (×4): qty 1

## 2016-01-16 MED ORDER — SODIUM CHLORIDE 0.9 % IV SOLN: 250.0000 mL | INTRAVENOUS | Status: DC | PRN

## 2016-01-16 MED ORDER — SODIUM BICARBONATE 650 MG PO TABS
650.0000 mg | ORAL_TABLET | Freq: Every day | ORAL | Status: DC
  Administered 2016-01-16 – 2016-01-20 (×5): 650 mg via ORAL
  Filled 2016-01-16 (×5): qty 1

## 2016-01-16 MED ORDER — METOPROLOL TARTRATE 25 MG PO TABS
12.5000 mg | ORAL_TABLET | Freq: Two times a day (BID) | ORAL | Status: DC
  Administered 2016-01-16 – 2016-01-20 (×9): 12.5 mg via ORAL
  Filled 2016-01-16 (×9): qty 1

## 2016-01-16 MED ORDER — SEVELAMER CARBONATE 800 MG PO TABS
800.0000 mg | ORAL_TABLET | Freq: Three times a day (TID) | ORAL | Status: DC
  Administered 2016-01-16 – 2016-01-20 (×11): 800 mg via ORAL
  Filled 2016-01-16 (×13): qty 1

## 2016-01-16 MED ORDER — INSULIN ASPART 100 UNIT/ML ~~LOC~~ SOLN
0.0000 [IU] | Freq: Three times a day (TID) | SUBCUTANEOUS | Status: DC
  Administered 2016-01-16 – 2016-01-17 (×3): 1 [IU] via SUBCUTANEOUS
  Administered 2016-01-18: 2 [IU] via SUBCUTANEOUS
  Administered 2016-01-18 – 2016-01-20 (×4): 1 [IU] via SUBCUTANEOUS

## 2016-01-16 MED ORDER — HEPARIN SODIUM (PORCINE) 5000 UNIT/ML IJ SOLN
5000.0000 [IU] | Freq: Three times a day (TID) | INTRAMUSCULAR | Status: DC
  Administered 2016-01-16 – 2016-01-20 (×12): 5000 [IU] via SUBCUTANEOUS
  Filled 2016-01-16 (×12): qty 1

## 2016-01-16 MED ORDER — INFLUENZA VAC SPLIT QUAD 0.5 ML IM SUSY
0.5000 mL | PREFILLED_SYRINGE | INTRAMUSCULAR | Status: AC
  Administered 2016-01-17: 0.5 mL via INTRAMUSCULAR
  Filled 2016-01-16: qty 0.5

## 2016-01-16 MED ORDER — CALCIUM CARBONATE-VITAMIN D 500-200 MG-UNIT PO TABS
1.0000 | ORAL_TABLET | Freq: Every day | ORAL | Status: DC
  Administered 2016-01-16 – 2016-01-20 (×5): 1 via ORAL
  Filled 2016-01-16 (×5): qty 1

## 2016-01-16 MED ORDER — CALCITRIOL 0.25 MCG PO CAPS
0.2500 ug | ORAL_CAPSULE | ORAL | Status: DC
  Administered 2016-01-16 – 2016-01-20 (×3): 0.25 ug via ORAL
  Filled 2016-01-16 (×3): qty 1

## 2016-01-16 MED ORDER — FAMOTIDINE 20 MG PO TABS
20.0000 mg | ORAL_TABLET | Freq: Every day | ORAL | Status: DC
  Administered 2016-01-16 – 2016-01-19 (×4): 20 mg via ORAL
  Filled 2016-01-16 (×4): qty 1

## 2016-01-16 MED ORDER — ONDANSETRON HCL 4 MG/2ML IJ SOLN: 4.0000 mg | Freq: Four times a day (QID) | INTRAMUSCULAR | Status: DC | PRN

## 2016-01-16 MED ORDER — ACETAMINOPHEN 325 MG PO TABS
650.0000 mg | ORAL_TABLET | Freq: Four times a day (QID) | ORAL | Status: DC | PRN
Start: 2016-01-16 — End: 2016-01-16

## 2016-01-16 MED ORDER — SODIUM CHLORIDE 0.9% FLUSH
3.0000 mL | Freq: Two times a day (BID) | INTRAVENOUS | Status: DC
  Administered 2016-01-16 – 2016-01-20 (×9): 3 mL via INTRAVENOUS

## 2016-01-16 MED ORDER — ALLOPURINOL 100 MG PO TABS
200.0000 mg | ORAL_TABLET | Freq: Every day | ORAL | Status: DC
  Administered 2016-01-16 – 2016-01-20 (×5): 200 mg via ORAL
  Filled 2016-01-16 (×5): qty 2

## 2016-01-16 MED ORDER — FUROSEMIDE 10 MG/ML IJ SOLN
40.0000 mg | INTRAMUSCULAR | Status: AC
  Administered 2016-01-16: 40 mg via INTRAVENOUS
  Filled 2016-01-16: qty 4

## 2016-01-16 MED ORDER — HYDRALAZINE HCL 50 MG PO TABS
50.0000 mg | ORAL_TABLET | Freq: Two times a day (BID) | ORAL | Status: DC
  Administered 2016-01-16 – 2016-01-20 (×9): 50 mg via ORAL
  Filled 2016-01-16 (×9): qty 1

## 2016-01-16 MED ORDER — ISOSORBIDE MONONITRATE ER 30 MG PO TB24
60.0000 mg | ORAL_TABLET | Freq: Every day | ORAL | Status: DC
  Administered 2016-01-16 – 2016-01-20 (×5): 60 mg via ORAL
  Filled 2016-01-16 (×5): qty 2

## 2016-01-16 MED ORDER — DOCUSATE SODIUM 100 MG PO CAPS
100.0000 mg | ORAL_CAPSULE | Freq: Every day | ORAL | Status: DC | PRN
Start: 2016-01-16 — End: 2016-01-20

## 2016-01-16 MED ORDER — LEVOTHYROXINE SODIUM 25 MCG PO TABS
125.0000 ug | ORAL_TABLET | Freq: Every day | ORAL | Status: DC
  Administered 2016-01-17 – 2016-01-20 (×4): 125 ug via ORAL
  Filled 2016-01-16 (×4): qty 1

## 2016-01-16 MED ORDER — IPRATROPIUM-ALBUTEROL 0.5-2.5 (3) MG/3ML IN SOLN: 3.0000 mL | Freq: Every evening | RESPIRATORY_TRACT | Status: DC | PRN

## 2016-01-16 MED ORDER — ASPIRIN 325 MG PO TABS
325.0000 mg | ORAL_TABLET | Freq: Every day | ORAL | Status: DC
  Administered 2016-01-16 – 2016-01-20 (×5): 325 mg via ORAL
  Filled 2016-01-16 (×5): qty 1

## 2016-01-16 NOTE — ED Notes (Signed)
Patient transported to CT 

## 2016-01-16 NOTE — ED Provider Notes (Signed)
[facility]-EMERGENCY DEPT Provider Note   CSN: [id number] Arrival date & time: 01/16/16  1020     History   Chief Complaint Chief Complaint  Patient presents with  . medical evaluation    HPI [name] is a 82 y.o. female.  Patient is a 82 year old female with history of CAD, TIA, stroke (12/28/15, currently on plavix), hypertension, CHF, PVD, CKD and DM who presents to the ED EMS from [facility] with complaint of shortness of breath. Patient's son at bedside reports that she is having worsening shortness of breath over the past week with associated swelling in her arms, right worse than left. Endorses associated wheezing and cough. Son also reports patient has had 2 unwitnessed falls this week and notes that patient was found laying on the ground next to her bed. Head injury or LOC unknown. Son reports patient has been at her baseline mental status. Denies fever, chills, headache, lightheadedness, dizziness, chest pain, hemoptysis, abdominal pain, nausea, vomiting, diarrhea, urinary symptoms, weakness.      Past Medical History:  Diagnosis Date  . Abnormal nuclear stress test 06/01/09   Demonstrated a new area of infarct scar, peri-infarct ischemia seen in the inferolateral territory. EF eas normal at 70% with mild hypocontractility at the apex, distal inferolateral wall.  . Anemia   . Arthritis   . Cardiomyopathy, idiopathic (HCC) 02/08/2011  . Chronic diastolic CHF (congestive heart failure) (HCC)    Takes Lasix  . Chronic kidney disease (CKD), stage IV (severe) (HCC)   . Coronary artery disease    a. Cath 09/2010 - med rx.  . Diabetes mellitus    type 2 NIDDM  . Dyslipidemia   . GERD (gastroesophageal reflux disease)   . Gout    takes allopurinol  . H/O echocardiogram 09/06/11   Indication- nonIschemic Cardiomyopathy. EF = now greater than 55% with no regional wall motion abnormalities. Tthere is mild to moderate trisuspid regurgitayion and mild pulmonary  hypertension with an RVSP of 35 mmHg as well as stage 1 diastolic dysfunction and mild to moderate LVH.  Marland Kitchen Hx of transient ischemic attack (TIA)   . Hypertension   . Hypothyroidism    (SEVERE) Takes Levothryroxine  . Irregular heartbeat   . Memory loss   . Myocardial infarct    x 3 unsure of years  . Nonischemic cardiomyopathy (HCC)    EF now is 55%, reduced due to myxedema, which is improved.  . Peripheral neuropathy (HCC)   . Peripheral vascular disease (HCC)    a. s/p L BKA.  . Shingles   . Stroke (HCC)   . TIA (transient ischemic attack)   . Ulcer [facility])     Patient Active Problem List   Diagnosis Date Noted  . Acute on chronic systolic CHF (congestive heart failure) (HCC) 01/16/2016  . Palliative care encounter   . Altered mental status   . Goals of care, counseling/discussion   . Palliative care by specialist   . Aphasia following other cerebrovascular disease   . Hemi-neglect of right side   . Cerebral thrombosis with cerebral infarction 12/30/2015  . Cerebrovascular accident (CVA) involving left middle cerebral artery territory [facility])   . Altered mental state 12/29/2015  . Acute right-sided weakness 12/29/2015  . Dysphagia   . FTT (failure to thrive) in adult 11/08/2015  . Nausea & vomiting 11/08/2015  . Metabolic acidosis 11/08/2015  . Hypertensive heart disease with heart failure (HCC) 10/07/2015  . Junctional bradycardia 09/18/2015  . Atypical chest pain 06/23/2014  .  Neck pain   . Food impaction of esophagus   . Epiglottitis 04/13/2014  . Wound disruption, post-op, skin 02/05/2014  . Pre-operative cardiovascular examination 12/13/2013  . Foot pain 09/04/2013  . CKD (chronic kidney disease), stage IV (HCC) 09/04/2013  . Uncontrolled hypertension 08/23/2013  . Malnutrition of moderate degree (HCC) 08/21/2013  . Chronic diastolic heart failure (HCC) 08/15/2013  . Anemia of chronic disease 08/15/2013  . Hyperkalemia 08/15/2013  . Chronic total occlusion of  artery of the extremities (HCC) 02/27/2013  . Dyslipidemia 01/05/2013  . Chronic renal insufficiency, stage IV (severe)- HD started 01/05/13 01/05/2013  . Diabetes mellitus type 2 with peripheral artery disease (HCC) 01/05/2013  . Peripheral vascular disease (HCC) 10/19/2011  . CAD - moderate at cath July 2012 (medical Rx) 02/08/2011  . 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 Hypothyroid 02/08/2011    Past Surgical History:  Procedure Laterality Date  . AMPUTATION  07/05/2011   Procedure: AMPUTATION BELOW KNEE;  Surgeon: Chuck Hint, MD;  Location: [facility] OR;  Service: Vascular;  Laterality: Left;  . ANGIOPLASTY  1988  . AV FISTULA PLACEMENT    . AV FISTULA PLACEMENT Right 12/24/2013   Procedure: INSERTION OF ARTERIOVENOUS (AV) GORE-TEX GRAFT ARM;  Surgeon: Chuck Hint, MD;  Location: [facility] OR;  Service: Vascular;  Laterality: Right;  . BACK SURGERY     [facility]  . CARDIAC CATHETERIZATION  09/28/07   Demonstrated multiple sequential lesions around 40 to 30% in the RCA territory.  . Duplex doppler  05/10/11   LE arterial dopplers demonstrate bilaterally reduced ABIs of 0.91 on right & 0.56 on left. She does report some decreased pain on the left, & there's moderate mixed-density plaque in the right SFA w/50 to 69% reduction. There's a 69% reduction in the left SFA & does appear to be occlusive disease of left posterior tibial artery. Right posterior dorsalis pedis artery demonstrates occlusive disease  . ESOPHAGOGASTRODUODENOSCOPY N/A 04/15/2014   Procedure: ESOPHAGOGASTRODUODENOSCOPY (EGD);  Surgeon: Rachael Fee, MD;  Location[facility] ENDOSCOPY;  Service: Endoscopy;  Laterality: N/A;  . EYE SURGERY     Left eye surgery; cataract removal  . INSERTION OF DIALYSIS CATHETER N/A 01/06/2013   Procedure: INSERTION OF DIALYSIS CATHETER;  Surgeon: Larina Earthly, MD;  Location: [facility] OR;  Service: Vascular;  Laterality: N/A;  . left bka  07/05/2011  . left  lower extremity venous duplex Left 06/27/11   Summary: No evidence of DVT involving the left lower extremity and right common femoral vein.   Marland Kitchen LIGATION ARTERIOVENOUS GORTEX GRAFT Right 03/25/2014   Procedure: LIGATION ARTERIOVENOUS GORTEX GRAFT;  Surgeon: Chuck Hint, MD;  Location: [facility] OR;  Service: Vascular;  Laterality: Right;  . LOWER EXTREMITY ANGIOGRAM N/A 10/31/2011   Procedure: LOWER EXTREMITY ANGIOGRAM;  Surgeon: Chuck Hint, MD;  Location: [facility] CATH LAB;  Service: Cardiovascular;  Laterality: N/A;  . Lower extremity arterial evaluation  06/27/11   SUMMARY: Right: ABI not ascertained due to false elevation in BP secondary to calcification (posterior tibial artery is non compressible). Left: ABI indicates moderate reduction in arterial flow. Bilateral: Great toe PPG waveforms indicate adequate perfusion. Great toe pressures not obtained due to patient's movements secondary to pain.  Marland Kitchen SHUNTOGRAM N/A 03/17/2014   Procedure: Betsey Amen;  Surgeon: Fransisco Hertz, MD;  Location: [facility] CATH LAB;  Service: Cardiovascular;  Laterality: N/A;    OB History    No data available  Home Medications    Prior to Admission medications   Medication Sig Start Date End Date Taking? Authorizing Provider  acetaminophen (TYLENOL) 325 MG tablet Take 650 mg by mouth every 6 (six) hours as needed for mild pain.   Yes Historical Provider, MD  allopurinol (ZYLOPRIM) 100 MG tablet Take 200 mg by mouth daily.    Yes Historical Provider, MD  amLODipine (NORVASC) 10 MG tablet Take 1 tablet (10 mg total) by mouth daily. 10/22/15  Yes Chrystie Nose, MD  aspirin 325 MG tablet Take 1 tablet (325 mg total) by mouth daily. 01/04/16  Yes Alison Murray, MD  calcitRIOL (ROCALTROL) 0.25 MCG capsule Take 0.25 mcg by mouth every other day.  11/04/12  Yes Historical Provider, MD  Calcium Carbonate-Vitamin D (CALCIUM-VITAMIN D) 500-200 MG-UNIT tablet Take 1 tablet by mouth daily.   Yes Historical Provider, MD    docusate sodium (COLACE) 100 MG capsule Take 100 mg by mouth daily as needed for mild constipation.   Yes Historical Provider, MD  famotidine (PEPCID) 20 MG tablet Take 1 tablet (20 mg total) by mouth at bedtime. 11/13/15  Yes Rodolph Bong, MD  hydrALAZINE (APRESOLINE) 50 MG tablet Take 1 tablet (50 mg total) by mouth 2 (two) times daily. 10/22/15  Yes Chrystie Nose, MD  insulin aspart (NOVOLOG) 100 UNIT/ML injection Inject 0-9 Units into the skin every 4 (four) hours. Patient taking differently: Inject 0-9 Units into the skin every 4 (four) hours. Give per Moderate sliding scale 01/04/16  Yes Alison Murray, MD  isosorbide mononitrate (IMDUR) 60 MG 24 hr tablet Take 60 mg by mouth daily.   Yes Historical Provider, MD  levothyroxine (SYNTHROID, LEVOTHROID) 125 MCG tablet Take 125 mcg by mouth daily before breakfast. Take for 2 weeks 01/10/16 01/23/16 Yes Historical Provider, MD  metoCLOPramide (REGLAN) 5 MG tablet Take 1 tablet (5 mg total) by mouth every 6 (six) hours as needed for nausea. 01/04/16  Yes Alison Murray, MD  metoprolol tartrate (LOPRESSOR) 25 MG tablet Take 12.5 mg by mouth 2 (two) times daily.   Yes Historical Provider, MD  Nutritional Supplements (NUTRI-DRINK PO) Take 1 Can by mouth 3 (three) times daily. Med Pass 1.7   Yes Historical Provider, MD  OXYGEN Inhale 2 L into the lungs continuous. 2 L per minute via nasal cannula   Yes Historical Provider, MD  pravastatin (PRAVACHOL) 40 MG tablet Take 40 mg by mouth every evening.    Yes Historical Provider, MD  RENVELA 800 MG tablet Take 800 mg by mouth 3 (three) times daily with meals.  07/04/13  Yes Historical Provider, MD  sodium bicarbonate 650 MG tablet Take 1 tablet (650 mg total) by mouth daily. 11/13/15  Yes Rodolph Bong, MD  Vitamin D, Ergocalciferol, (DRISDOL) 50000 units CAPS capsule Take 50,000 Units by mouth 2 (two) times a week. On Wednesdays and Fridays   Yes Historical Provider, MD  albuterol-ipratropium (COMBIVENT)  18-103 MCG/ACT inhaler Inhale 2 puffs into the lungs at bedtime as needed for wheezing or shortness of breath.     Historical Provider, MD  feeding supplement (BOOST / RESOURCE BREEZE) LIQD Take 1 Container by mouth 3 (three) times daily between meals. Patient not taking: Reported on 01/16/2016 11/13/15   Rodolph Bong, MD  levothyroxine (SYNTHROID, LEVOTHROID) 100 MCG tablet Take 1 tablet (100 mcg total) by mouth daily before breakfast. Patient not taking: Reported on 01/16/2016 11/13/15   Rodolph Bong, MD  levothyroxine (SYNTHROID, LEVOTHROID) 150  MCG tablet Take 150 mcg by mouth daily before breakfast. To start on 01/24/16    Historical Provider, MD    Family History Family History  Problem Relation Age of Onset  . Heart attack Daughter   . Heart disease Daughter     Before age 85  . Cancer Sister     STOMACH  . Diabetes Sister   . Cancer Brother     BONE  . Diabetes Brother   . Hyperlipidemia Daughter   . Hypertension Daughter   . Heart disease Daughter     before age 63  . Kidney disease Daughter   . Other Daughter     varicose veins  . Diabetes Daughter   . Heart disease Son     before age 58  . Hyperlipidemia Son   . Hypertension Son   . Heart attack Son   . Anesthesia problems Neg Hx   . Hypotension Neg Hx   . Malignant hyperthermia Neg Hx   . Pseudochol deficiency Neg Hx     Social History Social History  Substance Use Topics  . Smoking status: Former Smoker    Packs/day: 0.50    Years: 40.00    Quit date: 07/05/1986  . Smokeless tobacco: Former Neurosurgeon     Comment: quit smoking 1988  . Alcohol use No     Allergies   Aspirin   Review of Systems Review of Systems  Respiratory: Positive for cough (nonproductive), shortness of breath and wheezing.   Musculoskeletal:       Arm swelling  All other systems reviewed and are negative.    Physical Exam Updated Vital Signs BP (!) 141/73 (BP Location: Left Arm)   Pulse 83   Temp 98.3 F (36.8 C)  (Oral)   Resp 20   Ht 5\' 4"  (1.626 m)   Wt 56.3 kg   SpO2 97%   BMI 21.30 kg/m   Physical Exam  Constitutional: She is oriented to person, place, and time. She appears well-developed and well-nourished. No distress.  HENT:  Head: Normocephalic. Head is without raccoon's eyes, without Battle's sign, without abrasion and without laceration.  Right Ear: Tympanic membrane normal. No hemotympanum.  Left Ear: Tympanic membrane normal. No hemotympanum.  Nose: Nose normal. No sinus tenderness, nasal deformity, septal deviation or nasal septal hematoma. No epistaxis.  Mouth/Throat: Uvula is midline, oropharynx is clear and moist and mucous membranes are normal. No oropharyngeal exudate, posterior oropharyngeal edema, posterior oropharyngeal erythema or tonsillar abscesses.  Eyes: Conjunctivae and EOM are normal. Pupils are equal, round, and reactive to light. Right eye exhibits no discharge. Left eye exhibits no discharge. No scleral icterus.  Neck: Normal range of motion. Neck supple.  Cardiovascular: Normal rate, regular rhythm, normal heart sounds and intact distal pulses.   Pulmonary/Chest: Effort normal. No respiratory distress (anterior chest wall mildly TTP). She has no wheezes. She has no rales. She exhibits tenderness (mild TTP over anterior chest wall).  Abdominal: Soft. She exhibits no distension and no mass. There is no tenderness. There is no rebound and no guarding. No hernia.  Musculoskeletal: Normal range of motion. She exhibits no tenderness or deformity.  No cervical, thoracic, or lumbar spine midline TTP.  Full ROM of bilateral upper and lower extremities with 5/5 strength.  Left AKA. Swelling noted to bilateral UE with 1+ pitting edema noted to right lower arm/hand. 2+ radial and PT pulses. Sensation grossly intact.    Neurological: She is alert and oriented to person, place, and  time. She has normal strength. No cranial nerve deficit or sensory deficit. Coordination and gait  normal.  Skin: Skin is warm and dry. She is not diaphoretic.  Nursing note and vitals reviewed.    ED Treatments / Results  Labs (all labs ordered are listed, but only abnormal results are displayed) Labs Reviewed  CBC WITH DIFFERENTIAL/PLATELET - Abnormal; Notable for the following:       Result Value   RBC 3.37 (*)    Hemoglobin 9.5 (*)    HCT 30.8 (*)    RDW 19.7 (*)    All other components within normal limits  BASIC METABOLIC PANEL - Abnormal; Notable for the following:    Glucose, Bld 105 (*)    Creatinine, Ser 1.98 (*)    GFR calc non Af Amer 21 (*)    GFR calc Af Amer 24 (*)    All other components within normal limits  BRAIN NATRIURETIC PEPTIDE - Abnormal; Notable for the following:    B Natriuretic Peptide 3,168.6 (*)    All other components within normal limits  URINALYSIS, ROUTINE W REFLEX MICROSCOPIC (NOT AT [facility]) - Abnormal; Notable for the following:    APPearance CLOUDY (*)    Bilirubin Urine SMALL (*)    Protein, ur 100 (*)    All other components within normal limits  URINE MICROSCOPIC-ADD ON - Abnormal; Notable for the following:    Bacteria, UA MANY (*)    All other components within normal limits  PROTIME-INR  I-STAT TROPOININ, ED    EKG  EKG Interpretation  Date/Time:  Saturday January 16 2016 11:44:32 EDT Ventricular Rate:  80 PR Interval:    QRS Duration: 117 QT Interval:  504 QTC Calculation: 582 R Axis:   91 Text Interpretation:  Sinus rhythm Probable left atrial enlargement Incomplete right bundle branch block Borderline low voltage, extremity leads Probable anteroseptal infarct, old Abnormal T, consider ischemia, lateral leads Long QTc T wave abnormalities appear similar in 2, 3, V3-V6 T waves appear improved in V1, V2 Abnormal ECG.  Confirmed by Rush Landmark MD, CHRISTOPHER [phone]) on 01/16/2016 11:51:16 AM       Radiology Dg Chest 2 View  Result Date: 01/16/2016 CLINICAL DATA:  Shortness of breath EXAM: CHEST  2 VIEW COMPARISON:   12/31/2015 chest radiograph. FINDINGS: Stable cardiomediastinal silhouette with mild cardiomegaly and aortic atherosclerosis. No pneumothorax. Small bilateral pleural effusions, increased on the left. Mild pulmonary edema. Bibasilar lung opacities, favor atelectasis. IMPRESSION: 1. Mild congestive heart failure. 2. Small bilateral pleural effusions, increased on the left. 3. Bibasilar lung opacities, favor atelectasis . 4. Aortic atherosclerosis. Electronically Signed   By: Delbert Phenix M.D.   On: 01/16/2016 12:31   Ct Head Wo Contrast  Result Date: 01/16/2016 CLINICAL DATA:  Multiple falls EXAM: CT HEAD WITHOUT CONTRAST CT CERVICAL SPINE WITHOUT CONTRAST TECHNIQUE: Multidetector CT imaging of the head and cervical spine was performed following the standard protocol without intravenous contrast. Multiplanar CT image reconstructions of the cervical spine were also generated. COMPARISON:  MRI brain dated 12/29/2015. CT head dated 12/28/2015. CT neck dated 04/13/2014. FINDINGS: CT HEAD FINDINGS Brain: No evidence of acute infarction, hemorrhage, hydrocephalus, extra-axial collection or mass lesion/mass effect. Extensive subcortical white matter and periventricular small vessel ischemic changes. Intracranial atherosclerosis. Vascular: No hyperdense vessel or unexpected calcification. Skull: Normal. Negative for fracture or focal lesion. Sinuses/Orbits: Visualized paranasal sinuses are clear. Partial opacification the left mastoid air cells (series 3/ image 5). Other: Global cortical and central atrophy. Secondary ventriculomegaly. CT  CERVICAL SPINE FINDINGS Alignment: Reversal of the normal cervical lordosis. Skull base and vertebrae: No acute fracture. No primary bone lesion or focal pathologic process. Soft tissues and spinal canal: No prevertebral fluid or swelling. No visible canal hematoma. Disc levels:  Moderate multilevel degenerative changes. Spinal canal remains patent. Upper chest: Visualized lung apices  are notable for emphysematous changes and layering bilateral pleural effusions. Other: None. IMPRESSION: No evidence of acute intracranial abnormality. Extensive small vessel ischemic changes. Atrophy with secondary ventriculomegaly. No evidence of traumatic injury to the cervical spine. Moderate multilevel degenerative changes. Layering bilateral pleural effusions, incompletely visualized. Electronically Signed   By: Charline Bills M.D.   On: 01/16/2016 12:17   Ct Cervical Spine Wo Contrast  Result Date: 01/16/2016 CLINICAL DATA:  Multiple falls EXAM: CT HEAD WITHOUT CONTRAST CT CERVICAL SPINE WITHOUT CONTRAST TECHNIQUE: Multidetector CT imaging of the head and cervical spine was performed following the standard protocol without intravenous contrast. Multiplanar CT image reconstructions of the cervical spine were also generated. COMPARISON:  MRI brain dated 12/29/2015. CT head dated 12/28/2015. CT neck dated 04/13/2014. FINDINGS: CT HEAD FINDINGS Brain: No evidence of acute infarction, hemorrhage, hydrocephalus, extra-axial collection or mass lesion/mass effect. Extensive subcortical white matter and periventricular small vessel ischemic changes. Intracranial atherosclerosis. Vascular: No hyperdense vessel or unexpected calcification. Skull: Normal. Negative for fracture or focal lesion. Sinuses/Orbits: Visualized paranasal sinuses are clear. Partial opacification the left mastoid air cells (series 3/ image 5). Other: Global cortical and central atrophy. Secondary ventriculomegaly. CT CERVICAL SPINE FINDINGS Alignment: Reversal of the normal cervical lordosis. Skull base and vertebrae: No acute fracture. No primary bone lesion or focal pathologic process. Soft tissues and spinal canal: No prevertebral fluid or swelling. No visible canal hematoma. Disc levels:  Moderate multilevel degenerative changes. Spinal canal remains patent. Upper chest: Visualized lung apices are notable for emphysematous changes and  layering bilateral pleural effusions. Other: None. IMPRESSION: No evidence of acute intracranial abnormality. Extensive small vessel ischemic changes. Atrophy with secondary ventriculomegaly. No evidence of traumatic injury to the cervical spine. Moderate multilevel degenerative changes. Layering bilateral pleural effusions, incompletely visualized. Electronically Signed   By: Charline Bills M.D.   On: 01/16/2016 12:17    Procedures Procedures (including critical care time)  Medications Ordered in ED Medications  levothyroxine (SYNTHROID, LEVOTHROID) tablet 125 mcg (not administered)  metoprolol tartrate (LOPRESSOR) tablet 12.5 mg (not administered)  Vitamin D (Ergocalciferol) (DRISDOL) capsule 50,000 Units (not administered)  aspirin tablet 325 mg (not administered)  allopurinol (ZYLOPRIM) tablet 200 mg (not administered)  pravastatin (PRAVACHOL) tablet 40 mg (not administered)  calcitRIOL (ROCALTROL) capsule 0.25 mcg (not administered)  sevelamer carbonate (RENVELA) tablet 800 mg (not administered)  isosorbide mononitrate (IMDUR) 24 hr tablet 60 mg (not administered)  docusate sodium (COLACE) capsule 100 mg (not administered)  ipratropium-albuterol (DUONEB) 0.5-2.5 (3) MG/3ML nebulizer solution 3 mL (not administered)  calcium-vitamin D (OSCAL WITH D) 500-200 MG-UNIT per tablet 1 tablet (not administered)  hydrALAZINE (APRESOLINE) tablet 50 mg (not administered)  sodium bicarbonate tablet 650 mg (not administered)  famotidine (PEPCID) tablet 20 mg (not administered)  metoCLOPramide (REGLAN) tablet 5 mg (not administered)  sodium chloride flush (NS) 0.9 % injection 3 mL (not administered)  sodium chloride flush (NS) 0.9 % injection 3 mL (not administered)  0.9 %  sodium chloride infusion (not administered)  acetaminophen (TYLENOL) tablet 650 mg (not administered)  ondansetron (ZOFRAN) injection 4 mg (not administered)  heparin injection 5,000 Units (not administered)  furosemide  (LASIX) injection 40 mg (not  administered)  insulin aspart (novoLOG) injection 0-9 Units (not administered)  furosemide (LASIX) injection 40 mg (40 mg Intravenous Given 01/16/16 1314)     Initial Impression / Assessment and Plan / ED Course  I have reviewed the triage vital signs and the nursing notes.  Pertinent labs & imaging results that were available during my care of the patient were reviewed by me and considered in my medical decision making (see chart for details).  Clinical Course    Patient presents from rehab facility with worsening shortness of breath and swelling for the past week. Family members report patient has also had multiple unwitnessed falls this week. Patient is currently on Plavix for recent stroke. Denies any changes in mental status. History of CHF. VSS. Exam revealed mild tenderness over anterior chest wall, lungs clear to auscultation bilaterally. No signs of head injury or trauma. Mild swelling noted to bilateral upper extremities. No neuro deficits. Patient alert and oriented 3.  EKG showed sinus rhythm with T wave inversions in inferior and lateral leads which appear chronic and unchanged. Negative troponin. Chest x-ray showed mild CHF with small bilateral pleural effusions, increased on the left, bibasilar lung opacities favoring atelectasis. CT head and cervical spine without acute findings. BNP 3,168. Patient symptoms appear to be contributed to CHF exacerbation. Pt given 40mg  IV lasix in the ED. Consultated hospitalist for admission. Dr. Randol KernElgergawy agrees to admission. Discussed results and plan for admission with patient and family.  Final Clinical Impressions(s) / ED Diagnoses   Final diagnoses:  Acute on chronic congestive heart failure, unspecified congestive heart failure type [facility](HCC)    New Prescriptions Current Discharge Medication List       Barrett Henleicole Elizabeth Wally Shevchenko, [place]PA-C 01/16/16 1446    Heide Scaleshristopher J Tegeler, MD 01/18/16 [phone]

## 2016-01-16 NOTE — H&P (Signed)
TRH H&P   Patient Demographics:    [name], is a 82 y.o. female  MRN: [id number]   DOB - 03-Jan-1924  Admit Date - 01/16/2016  Outpatient Primary MD for the patient is Georgianne Fick, MD  Referring MD/NP/PA: PA Nadeau  Patient coming from: SNF  Chief Complaint  Patient presents with  . medical evaluation      HPI:    [name]  is a 82 y.o. female, a history of prior TIA, Left BKA, PVD, CKD IV (she is not a dialysis patient), HTN, HLD, CAD, chronic diastolic heart failure,  DM , with recent admission for acute CVA, discharged to subacute rehabilitation, patient was brought for shortness of breath, patient is very poor historian, history was obtained from both daughters at bedside, they report patient has been complaining of worsening shortness of breath over the last few days, which has been progressive, she has been on oxygen via nasal cannula since discharge, was has remained at baseline, as well reports she developed swelling in her arms over last 2 days, as well reports patient had 2 falls from the bed over last week, unwitnessed, she was found laying on the floor, they deny fever, chills, headache, chest pain, hemoptysis, nausea or vomiting or diarrhea. - in ED workup was significant for elevated BNP at 3,168 and bilateral fluid pleural effusion and vascular congestion on imaging, patient received IV Lasix and hospitalist requested to admit    Review of systems:    In addition to the HPI above, No Fever-chills, No Headache, No changes with Vision or hearing, No problems swallowing food or Liquids, No Chest pain, Cough Reports shortness of breath No Abdominal pain, No Nausea or Vommitting, Bowel movements are regular, No Blood in stool or Urine, No dysuria, No new skin rashes or bruises, Reports she is hurting all over No new weakness, tingling, numbness in  any extremity, generalized weakness and fatigue No recent weight gain or loss, No polyuria, polydypsia or polyphagia, No significant Mental Stressors.  A full 10 point Review of Systems was done, except as stated above, all other Review of Systems were negative.   With Past History of the following :    Past Medical History:  Diagnosis Date  . Abnormal nuclear stress test 06/01/09   Demonstrated a new area of infarct scar, peri-infarct ischemia seen in the inferolateral territory. EF eas normal at 70% with mild hypocontractility at the apex, distal inferolateral wall.  . Anemia   . Arthritis   . Cardiomyopathy, idiopathic (HCC) 02/08/2011  . Chronic diastolic CHF (congestive heart failure) (HCC)    Takes Lasix  . Chronic kidney disease (CKD), stage IV (severe) (HCC)   . Coronary artery disease    a. Cath 09/2010 - med rx.  . Diabetes mellitus    type 2 NIDDM  . Dyslipidemia   . GERD (gastroesophageal reflux disease)   . Gout  takes allopurinol  . H/O echocardiogram 09/06/11   Indication- nonIschemic Cardiomyopathy. EF = now greater than 55% with no regional wall motion abnormalities. Tthere is mild to moderate trisuspid regurgitayion and mild pulmonary hypertension with an RVSP of 35 mmHg as well as stage 1 diastolic dysfunction and mild to moderate LVH.  Marland Kitchen Hx of transient ischemic attack (TIA)   . Hypertension   . Hypothyroidism    (SEVERE) Takes Levothryroxine  . Irregular heartbeat   . Memory loss   . Myocardial infarct    x 3 unsure of years  . Nonischemic cardiomyopathy (HCC)    EF now is 55%, reduced due to myxedema, which is improved.  . Peripheral neuropathy (HCC)   . Peripheral vascular disease (HCC)    a. s/p L BKA.  . Shingles   . Stroke (HCC)   . TIA (transient ischemic attack)   . Ulcer [facility])       Past Surgical History:  Procedure Laterality Date  . AMPUTATION  07/05/2011   Procedure: AMPUTATION BELOW KNEE;  Surgeon: Chuck Hint, MD;   Location: [facility] OR;  Service: Vascular;  Laterality: Left;  . ANGIOPLASTY  1988  . AV FISTULA PLACEMENT    . AV FISTULA PLACEMENT Right 12/24/2013   Procedure: INSERTION OF ARTERIOVENOUS (AV) GORE-TEX GRAFT ARM;  Surgeon: Chuck Hint, MD;  Location: [facility] OR;  Service: Vascular;  Laterality: Right;  . BACK SURGERY     [facility]  . CARDIAC CATHETERIZATION  09/28/07   Demonstrated multiple sequential lesions around 40 to 30% in the RCA territory.  . Duplex doppler  05/10/11   LE arterial dopplers demonstrate bilaterally reduced ABIs of 0.91 on right & 0.56 on left. She does report some decreased pain on the left, & there's moderate mixed-density plaque in the right SFA w/50 to 69% reduction. There's a 69% reduction in the left SFA & does appear to be occlusive disease of left posterior tibial artery. Right posterior dorsalis pedis artery demonstrates occlusive disease  . ESOPHAGOGASTRODUODENOSCOPY N/A 04/15/2014   Procedure: ESOPHAGOGASTRODUODENOSCOPY (EGD);  Surgeon: Rachael Fee, MD;  Location: [facility] ENDOSCOPY;  Service: Endoscopy;  Laterality: N/A;  . EYE SURGERY     Left eye surgery; cataract removal  . INSERTION OF DIALYSIS CATHETER N/A 01/06/2013   Procedure: INSERTION OF DIALYSIS CATHETER;  Surgeon: Larina Earthly, MD;  Location: [facility] OR;  Service: Vascular;  Laterality: N/A;  . left bka  07/05/2011  . left lower extremity venous duplex Left 06/27/11   Summary: No evidence of DVT involving the left lower extremity and right common femoral vein.   Marland Kitchen LIGATION ARTERIOVENOUS GORTEX GRAFT Right 03/25/2014   Procedure: LIGATION ARTERIOVENOUS GORTEX GRAFT;  Surgeon: Chuck Hint, MD;  Location: [facility] OR;  Service: Vascular;  Laterality: Right;  . LOWER EXTREMITY ANGIOGRAM N/A 10/31/2011   Procedure: LOWER EXTREMITY ANGIOGRAM;  Surgeon: Chuck Hint, MD;  Location: [facility] LAB;  Service: Cardiovascular;  Laterality: N/A;  . Lower extremity arterial evaluation  06/27/11   SUMMARY:  Right: ABI not ascertained due to false elevation in BP secondary to calcification (posterior tibial artery is non compressible). Left: ABI indicates moderate reduction in arterial flow. Bilateral: Great toe PPG waveforms indicate adequate perfusion. Great toe pressures not obtained due to patient's movements secondary to pain.  Marland Kitchen SHUNTOGRAM N/A 03/17/2014   Procedure: Betsey Amen;  Surgeon: Fransisco Hertz, MD;  Location: [facility] CATH LAB;  Service: Cardiovascular;  Laterality: N/A;      Social  History:     Social History  Substance Use Topics  . Smoking status: Former Smoker    Packs/day: 0.50    Years: 40.00    Quit date: 07/05/1986  . Smokeless tobacco: Former Neurosurgeon     Comment: quit smoking 1988  . Alcohol use No     Lives - Currently at SNF/subacute rehabilitation  Mobility - Wheelchair dependent     Family History :     Family History  Problem Relation Age of Onset  . Heart attack Daughter   . Heart disease Daughter     Before age 63  . Cancer Sister     STOMACH  . Diabetes Sister   . Cancer Brother     BONE  . Diabetes Brother   . Hyperlipidemia Daughter   . Hypertension Daughter   . Heart disease Daughter     before age 65  . Kidney disease Daughter   . Other Daughter     varicose veins  . Diabetes Daughter   . Heart disease Son     before age 77  . Hyperlipidemia Son   . Hypertension Son   . Heart attack Son   . Anesthesia problems Neg Hx   . Hypotension Neg Hx   . Malignant hyperthermia Neg Hx   . Pseudochol deficiency Neg Hx       Home Medications:   Prior to Admission medications   Medication Sig Start Date End Date Taking? Authorizing Provider  acetaminophen (TYLENOL) 325 MG tablet Take 650 mg by mouth every 6 (six) hours as needed for mild pain.   Yes Historical Provider, MD  allopurinol (ZYLOPRIM) 100 MG tablet Take 200 mg by mouth daily.    Yes Historical Provider, MD  amLODipine (NORVASC) 10 MG tablet Take 1 tablet (10 mg total) by mouth daily.  10/22/15  Yes Chrystie Nose, MD  aspirin 325 MG tablet Take 1 tablet (325 mg total) by mouth daily. 01/04/16  Yes Alison Murray, MD  calcitRIOL (ROCALTROL) 0.25 MCG capsule Take 0.25 mcg by mouth every other day.  11/04/12  Yes Historical Provider, MD  Calcium Carbonate-Vitamin D (CALCIUM-VITAMIN D) 500-200 MG-UNIT tablet Take 1 tablet by mouth daily.   Yes Historical Provider, MD  docusate sodium (COLACE) 100 MG capsule Take 100 mg by mouth daily as needed for mild constipation.   Yes Historical Provider, MD  famotidine (PEPCID) 20 MG tablet Take 1 tablet (20 mg total) by mouth at bedtime. 11/13/15  Yes Rodolph Bong, MD  hydrALAZINE (APRESOLINE) 50 MG tablet Take 1 tablet (50 mg total) by mouth 2 (two) times daily. 10/22/15  Yes Chrystie Nose, MD  insulin aspart (NOVOLOG) 100 UNIT/ML injection Inject 0-9 Units into the skin every 4 (four) hours. Patient taking differently: Inject 0-9 Units into the skin every 4 (four) hours. Give per Moderate sliding scale 01/04/16  Yes Alison Murray, MD  isosorbide mononitrate (IMDUR) 60 MG 24 hr tablet Take 60 mg by mouth daily.   Yes Historical Provider, MD  levothyroxine (SYNTHROID, LEVOTHROID) 125 MCG tablet Take 125 mcg by mouth daily before breakfast. Take for 2 weeks 01/10/16 01/23/16 Yes Historical Provider, MD  metoCLOPramide (REGLAN) 5 MG tablet Take 1 tablet (5 mg total) by mouth every 6 (six) hours as needed for nausea. 01/04/16  Yes Alison Murray, MD  metoprolol tartrate (LOPRESSOR) 25 MG tablet Take 12.5 mg by mouth 2 (two) times daily.   Yes Historical Provider, MD  Nutritional Supplements (NUTRI-DRINK  PO) Take 1 Can by mouth 3 (three) times daily. Med Pass 1.7   Yes Historical Provider, MD  OXYGEN Inhale 2 L into the lungs continuous. 2 L per minute via nasal cannula   Yes Historical Provider, MD  pravastatin (PRAVACHOL) 40 MG tablet Take 40 mg by mouth every evening.    Yes Historical Provider, MD  RENVELA 800 MG tablet Take 800 mg by mouth 3  (three) times daily with meals.  07/04/13  Yes Historical Provider, MD  sodium bicarbonate 650 MG tablet Take 1 tablet (650 mg total) by mouth daily. 11/13/15  Yes Rodolph Bong, MD  Vitamin D, Ergocalciferol, (DRISDOL) 50000 units CAPS capsule Take 50,000 Units by mouth 2 (two) times a week. On Wednesdays and Fridays   Yes Historical Provider, MD  albuterol-ipratropium (COMBIVENT) 18-103 MCG/ACT inhaler Inhale 2 puffs into the lungs at bedtime as needed for wheezing or shortness of breath.     Historical Provider, MD  feeding supplement (BOOST / RESOURCE BREEZE) LIQD Take 1 Container by mouth 3 (three) times daily between meals. Patient not taking: Reported on 01/16/2016 11/13/15   Rodolph Bong, MD  levothyroxine (SYNTHROID, LEVOTHROID) 100 MCG tablet Take 1 tablet (100 mcg total) by mouth daily before breakfast. Patient not taking: Reported on 01/16/2016 11/13/15   Rodolph Bong, MD  levothyroxine (SYNTHROID, LEVOTHROID) 150 MCG tablet Take 150 mcg by mouth daily before breakfast. To start on 01/24/16    Historical Provider, MD     Allergies:     Allergies  Allergen Reactions  . Aspirin Nausea And Vomiting    325 mg (adult strength) [name]      Physical Exam:   Vitals  Blood pressure 141/80, pulse 83, temperature 98 F (36.7 C), temperature source Oral, resp. rate 18, SpO2 99 %.   1. General Frail elderly female lying in bed in NAD,   2. Open eyes, follow simple commands, confused  3. Residual right-sided weakness , Plantars down going.  4. Ears and Eyes appear Normal, Conjunctivae clear, PERRLA. Moist Oral Mucosa.  5. Supple Neck, +5 cm JVD JVD, No cervical lymphadenopathy appriciated, No Carotid Bruits. Mild pitting edema in the hands and right lower extremity  6. Symmetrical Chest wall movement, diminished air entry at bases, bibasilar crackles  7. RRR, No Gallops, Rubs or Murmurs, No Parasternal  Heave.  8. Positive Bowel Sounds, Abdomen Soft, No tenderness, No organomegaly appriciated,No rebound -guarding or rigidity.  9.  No Cyanosis, Normal Skin Turgor, No Skin Rash or Bruise.  10. Good muscle tone,  joints appear normal , left AKA  11. No Palpable Lymph Nodes in Neck or Axillae    Data Review:    CBC  Recent Labs Lab 01/16/16 1151  WBC 6.6  HGB 9.5*  HCT 30.8*  PLT 270  MCV 91.4  MCH 28.2  MCHC 30.8  RDW 19.7*  LYMPHSABS 1.5  MONOABS 0.6  EOSABS 0.1  BASOSABS 0.0   ------------------------------------------------------------------------------------------------------------------  Chemistries   Recent Labs Lab 01/16/16 1151  NA 144  K 4.5  CL 110  CO2 24  GLUCOSE 105*  BUN 19  CREATININE 1.98*  CALCIUM 8.9   ------------------------------------------------------------------------------------------------------------------ CrCl cannot be calculated (Unknown ideal weight.). ------------------------------------------------------------------------------------------------------------------ No results for input(s): TSH, T4TOTAL, T3FREE, THYROIDAB in the last 72 hours.  Invalid input(s): FREET3  Coagulation profile  Recent Labs Lab 01/16/16 1151  INR 1.12   ------------------------------------------------------------------------------------------------------------------- No results for input(s): DDIMER  in the last 72 hours. -------------------------------------------------------------------------------------------------------------------  Cardiac Enzymes No results for input(s): CKMB, TROPONINI, MYOGLOBIN in the last 168 hours.  Invalid input(s): CK ------------------------------------------------------------------------------------------------------------------    Component Value Date/Time   BNP 3,168.6 (H) 01/16/2016 1151   BNP 34.4 01/01/2013 0951      ---------------------------------------------------------------------------------------------------------------  Urinalysis    Component Value Date/Time   COLORURINE YELLOW 01/16/2016 1243   APPEARANCEUR CLOUDY (A) 01/16/2016 1243   LABSPEC 1.017 01/16/2016 1243   PHURINE 5.5 01/16/2016 1243   GLUCOSEU NEGATIVE 01/16/2016 1243   HGBUR NEGATIVE 01/16/2016 1243   BILIRUBINUR SMALL (A) 01/16/2016 1243   KETONESUR NEGATIVE 01/16/2016 1243   PROTEINUR 100 (A) 01/16/2016 1243   UROBILINOGEN 0.2 08/18/2013 1807   NITRITE NEGATIVE 01/16/2016 1243   LEUKOCYTESUR NEGATIVE 01/16/2016 1243    ----------------------------------------------------------------------------------------------------------------   Imaging Results:    Dg Chest 2 View  Result Date: 01/16/2016 CLINICAL DATA:  Shortness of breath EXAM: CHEST  2 VIEW COMPARISON:  12/31/2015 chest radiograph. FINDINGS: Stable cardiomediastinal silhouette with mild cardiomegaly and aortic atherosclerosis. No pneumothorax. Small bilateral pleural effusions, increased on the left. Mild pulmonary edema. Bibasilar lung opacities, favor atelectasis. IMPRESSION: 1. Mild congestive heart failure. 2. Small bilateral pleural effusions, increased on the left. 3. Bibasilar lung opacities, favor atelectasis . 4. Aortic atherosclerosis. Electronically Signed   By: Delbert Phenix M.D.   On: 01/16/2016 12:31   Ct Head Wo Contrast  Result Date: 01/16/2016 CLINICAL DATA:  Multiple falls EXAM: CT HEAD WITHOUT CONTRAST CT CERVICAL SPINE WITHOUT CONTRAST TECHNIQUE: Multidetector CT imaging of the head and cervical spine was performed following the standard protocol without intravenous contrast. Multiplanar CT image reconstructions of the cervical spine were also generated. COMPARISON:  MRI brain dated 12/29/2015. CT head dated 12/28/2015. CT neck dated 04/13/2014. FINDINGS: CT HEAD FINDINGS Brain: No evidence of acute infarction, hemorrhage, hydrocephalus,  extra-axial collection or mass lesion/mass effect. Extensive subcortical white matter and periventricular small vessel ischemic changes. Intracranial atherosclerosis. Vascular: No hyperdense vessel or unexpected calcification. Skull: Normal. Negative for fracture or focal lesion. Sinuses/Orbits: Visualized paranasal sinuses are clear. Partial opacification the left mastoid air cells (series 3/ image 5). Other: Global cortical and central atrophy. Secondary ventriculomegaly. CT CERVICAL SPINE FINDINGS Alignment: Reversal of the normal cervical lordosis. Skull base and vertebrae: No acute fracture. No primary bone lesion or focal pathologic process. Soft tissues and spinal canal: No prevertebral fluid or swelling. No visible canal hematoma. Disc levels:  Moderate multilevel degenerative changes. Spinal canal remains patent. Upper chest: Visualized lung apices are notable for emphysematous changes and layering bilateral pleural effusions. Other: None. IMPRESSION: No evidence of acute intracranial abnormality. Extensive small vessel ischemic changes. Atrophy with secondary ventriculomegaly. No evidence of traumatic injury to the cervical spine. Moderate multilevel degenerative changes. Layering bilateral pleural effusions, incompletely visualized. Electronically Signed   By: Charline Bills M.D.   On: 01/16/2016 12:17   Ct Cervical Spine Wo Contrast  Result Date: 01/16/2016 CLINICAL DATA:  Multiple falls EXAM: CT HEAD WITHOUT CONTRAST CT CERVICAL SPINE WITHOUT CONTRAST TECHNIQUE: Multidetector CT imaging of the head and cervical spine was performed following the standard protocol without intravenous contrast. Multiplanar CT image reconstructions of the cervical spine were also generated. COMPARISON:  MRI brain dated 12/29/2015. CT head dated 12/28/2015. CT neck dated 04/13/2014. FINDINGS: CT HEAD FINDINGS Brain: No evidence of acute infarction, hemorrhage, hydrocephalus, extra-axial collection or mass lesion/mass  effect. Extensive subcortical white matter and periventricular small vessel ischemic changes. Intracranial atherosclerosis. Vascular: No hyperdense vessel or unexpected  calcification. Skull: Normal. Negative for fracture or focal lesion. Sinuses/Orbits: Visualized paranasal sinuses are clear. Partial opacification the left mastoid air cells (series 3/ image 5). Other: Global cortical and central atrophy. Secondary ventriculomegaly. CT CERVICAL SPINE FINDINGS Alignment: Reversal of the normal cervical lordosis. Skull base and vertebrae: No acute fracture. No primary bone lesion or focal pathologic process. Soft tissues and spinal canal: No prevertebral fluid or swelling. No visible canal hematoma. Disc levels:  Moderate multilevel degenerative changes. Spinal canal remains patent. Upper chest: Visualized lung apices are notable for emphysematous changes and layering bilateral pleural effusions. Other: None. IMPRESSION: No evidence of acute intracranial abnormality. Extensive small vessel ischemic changes. Atrophy with secondary ventriculomegaly. No evidence of traumatic injury to the cervical spine. Moderate multilevel degenerative changes. Layering bilateral pleural effusions, incompletely visualized. Electronically Signed   By: Charline BillsSriyesh  Krishnan M.D.   On: 01/16/2016 12:17    My personal review of EKG: Rhythm NSR, Rate  80 /min, QTc 582 , no Acute ST changes, old TW I lateral leads   Assessment & Plan:    Active Problems:   CAD - moderate at cath July 2012 (medical Rx)   Hypothyroid   Dyslipidemia   Chronic renal insufficiency, stage IV (severe)- HD started 01/05/13   Diabetes mellitus type 2 with peripheral artery disease (HCC)   Uncontrolled hypertension   Dysphagia   Cerebrovascular accident (CVA) involving left middle cerebral artery territory [facility](HCC)   Acute on chronic systolic CHF (congestive heart failure) (HCC)   Acute on chronic systolic CHF - Most recent echo on 01/01/2016 with EF  30-35%, with diffuse hypokinesis - Evidence of basilar pleural effusion and volume overload on chest x-ray, increased BNP, worsening edema. - Admitted under CHF pathway, started on Lasix 40 mg IV twice a day, monitor renal function closely, continue with beta blockers, hydralazine and Imdur(no ACEI or ARB given her renal failure), statin and aspirin - We'll consider adding spironolactone  Chronic respiratory failure - At baseline  History of CVA - Recent admission for CVA (discharged on 01/04/2016), continue with aspirin and statin  CAD - moderate at cath July 2012 (medical Rx) - Continue aspirin, and statin  Prolonged QTC - QTc is 482, will check magnesium level, potassium with normal level, we'll repeat EKG in a.m., monitor on telemetry  Dyslipidemia associated with type 2 DM  - Continue statin therapy   Hypothyroidism - Continue synthroid   Hypokalemia - Supplemented   Essential hypertension - Continue  hydralazine, imdur and metoprolol, will discontinue Norvasc as started on diuresis(will monitor blood pressure on diuresis)   Chronic renal insufficiency, stage IV  - Cr 1.9 at baseline - Continue with renavela  Diabetes mellitus type 2 with peripheral artery disease and diabetic nephropathy and long term insulin use (HCC) - Continue SSI      DVT Prophylaxis Heparin -   SCDs   AM Labs Ordered, also please review Full Orders  Family Communication: Admission, patients condition and plan of care including tests being ordered have been discussed with the patient and daughters at bedside who indicate understanding and agree with the plan and Code Status.  Code Status DO NOT RESUSCITATE, confirmed by both daughters at bedside  Likely DC to  : back to SNF  Condition GUARDED    Consults called: None  Admission status: inpatient  Time spent in minutes : 65 minutes   Bertie Simien M.D on 01/16/2016 at 1:35 PM  Between 7am to 7pm - Pager -  [phone]. After 7pm go  to [email or web address] - password [facility]  [facility] - Office  [phone]

## 2016-01-16 NOTE — ED Triage Notes (Signed)
Per nurse at [facility], family requested patient be transported to emergency department because family stated patient had several falls over the past several weeks. Nurse at [facility] states patient is without injuries, neuro checks WNL and patient at baseline. Nurse reports patient has been non compliant with meds and refuses to swallow crushed pills, non compliant with O2 and not participating in rehab. Pt arrived alert and able to follow commands.

## 2016-01-17 LAB — BASIC METABOLIC PANEL
Anion gap: 10 (ref 5–15)
BUN: 18 mg/dL (ref 6–20)
CALCIUM: 8.8 mg/dL — AB (ref 8.9–10.3)
CO2: 24 mmol/L (ref 22–32)
CREATININE: 2.12 mg/dL — AB (ref 0.44–1.00)
Chloride: 109 mmol/L (ref 101–111)
GFR calc non Af Amer: 19 mL/min — ABNORMAL LOW (ref >60)
GFR, EST AFRICAN AMERICAN: 22 mL/min — AB (ref >60)
Glucose, Bld: 105 mg/dL — ABNORMAL HIGH (ref 65–99)
Potassium: 3.9 mmol/L (ref 3.5–5.1)
SODIUM: 143 mmol/L (ref 135–145)

## 2016-01-17 LAB — GLUCOSE, CAPILLARY
GLUCOSE-CAPILLARY: 132 mg/dL — AB (ref 65–99)
Glucose-Capillary: 109 mg/dL — ABNORMAL HIGH (ref 65–99)
Glucose-Capillary: 137 mg/dL — ABNORMAL HIGH (ref 65–99)
Glucose-Capillary: 116 mg/dL — ABNORMAL HIGH (ref 65–99)

## 2016-01-17 LAB — MAGNESIUM: MAGNESIUM: 2 mg/dL (ref 1.7–2.4)

## 2016-01-17 NOTE — Progress Notes (Addendum)
Patient ID: [name] Perlstein, female   DOB: 07/28/1923, 82 y.o.   MRN: [id number]    PROGRESS NOTE    [name] On  WUJ:[id number] DOB: 05/04/1923 DOA: 01/16/2016  PCP: Georgianne FickAMACHANDRAN,AJITH, MD   Brief Narrative:  82 y.o. female, a history of prior TIA, Left BKA, PVD, CKD IV (she is not a dialysis patient), HTN, HLD, CAD, chronic diastolic heart failure, DM , with recent admission for acute CVA, discharged to rehabilitation, patient was brought for shortness of breath, patient is very poor historian, history was obtained from both daughters at bedside, they report patient has been complaining of progressively worsening shortness of breath over the last few days, associated with upper and lower extremity swelling, 2 episodes of unwitnessed falls.  Assessment & Plan:  Acute respiratory failure with hypoxia secondary to acute on chronic systolic CHF exacerbation - recent ECHO 12/2015 with EF 30 - 35% - pt looks clinically stable this AM - pt currently on Lasix 40 mg IV BID - will continue same regimen, no need for repeat ECHO this time - continue to monitor daily weights, strict I/O  Essential HTN - SBO high this AM in 160's - pt on Imdur, Hydralazine, Lasix - continue same regimen  HLD - continue statin  CKD stage IV - with baseline Cr ~2 - monitor closely while on IV Lasix  - BMP in AM  DM type II with complications of nephropathy - continue with SSI until oral intake improves   Anemia of chronic disease - due to CKD - no signs of active bleeding - CBC in AM  DVT prophylaxis: Heparin SQ Code Status: DNR Family Communication: Patient at bedside  Disposition Plan: To be determined   Consultants:   None  Procedures:   None  Antimicrobials:   None  Subjective: No events overnight.   Objective: Vitals:   01/16/16 1434 01/16/16 2107 01/17/16 0616 01/17/16 1223  BP: (!) 141/73 (!) 136/58 (!) 149/37 (!) 164/77  Pulse: 83 71 88 95  Resp: 20 17 18    Temp: 98.3 F  (36.8 C) 97.4 F (36.3 C) 98.5 F (36.9 C) 98.2 F (36.8 C)  TempSrc: Oral Oral Oral Oral  SpO2: 97% 100% 94% 96%  Weight: 56.3 kg (124 lb 1.9 oz)     Height: 5\' 4"  (1.626 m)       Intake/Output Summary (Last 24 hours) at 01/17/16 1312 Last data filed at 01/16/16 1800  Gross per 24 hour  Intake              330 ml  Output              100 ml  Net              230 ml   Filed Weights   01/16/16 1434  Weight: 56.3 kg (124 lb 1.9 oz)    Examination:  General exam: Appears calm and comfortable  Respiratory system: Crackles at bases  Cardiovascular system: RRR. No JVD, murmurs, rubs, gallops or clicks. Left AKA Gastrointestinal system: Abdomen is nondistended, soft and nontender. No organomegaly or masses felt.  Central nervous system: Alert and oriented.   Data Reviewed: I have personally reviewed following labs and imaging studies  CBC:  Recent Labs Lab 01/16/16 1151  WBC 6.6  NEUTROABS 4.3  HGB 9.5*  HCT 30.8*  MCV 91.4  PLT 270   Basic Metabolic Panel:  Recent Labs Lab 01/16/16 1151 01/17/16 0552  NA 144 143  K 4.5 3.9  CL 110 109  CO2 24 24  GLUCOSE 105* 105*  BUN 19 18  CREATININE 1.98* 2.12*  CALCIUM 8.9 8.8*  MG  --  2.0   Coagulation Profile:  Recent Labs Lab 01/16/16 1151  INR 1.12   CBG:  Recent Labs Lab 01/16/16 1656 01/16/16 2113 01/17/16 0830 01/17/16 1233  GLUCAP 142* 117* 109* 137*   Urine analysis:    Component Value Date/Time   COLORURINE YELLOW 01/16/2016 1243   APPEARANCEUR CLOUDY (A) 01/16/2016 1243   LABSPEC 1.017 01/16/2016 1243   PHURINE 5.5 01/16/2016 1243   GLUCOSEU NEGATIVE 01/16/2016 1243   HGBUR NEGATIVE 01/16/2016 1243   BILIRUBINUR SMALL (A) 01/16/2016 1243   KETONESUR NEGATIVE 01/16/2016 1243   PROTEINUR 100 (A) 01/16/2016 1243   UROBILINOGEN 0.2 08/18/2013 1807   NITRITE NEGATIVE 01/16/2016 1243   LEUKOCYTESUR NEGATIVE 01/16/2016 1243   Recent Results (from the past 240 hour(s))  MRSA PCR  Screening     Status: None   Collection Time: 01/16/16  3:02 PM  Result Value Ref Range Status   MRSA by PCR NEGATIVE NEGATIVE Final    Comment:        The GeneXpert MRSA Assay (FDA approved for NASAL specimens only), is one component of a comprehensive MRSA colonization surveillance program. It is not intended to diagnose MRSA infection nor to guide or monitor treatment for MRSA infections.       Radiology Studies: Dg Chest 2 View  Result Date: 01/16/2016 CLINICAL DATA:  Shortness of breath EXAM: CHEST  2 VIEW COMPARISON:  12/31/2015 chest radiograph. FINDINGS: Stable cardiomediastinal silhouette with mild cardiomegaly and aortic atherosclerosis. No pneumothorax. Small bilateral pleural effusions, increased on the left. Mild pulmonary edema. Bibasilar lung opacities, favor atelectasis. IMPRESSION: 1. Mild congestive heart failure. 2. Small bilateral pleural effusions, increased on the left. 3. Bibasilar lung opacities, favor atelectasis . 4. Aortic atherosclerosis. Electronically Signed   By: Delbert Phenix M.D.   On: 01/16/2016 12:31   Ct Head Wo Contrast  Result Date: 01/16/2016 CLINICAL DATA:  Multiple falls EXAM: CT HEAD WITHOUT CONTRAST CT CERVICAL SPINE WITHOUT CONTRAST TECHNIQUE: Multidetector CT imaging of the head and cervical spine was performed following the standard protocol without intravenous contrast. Multiplanar CT image reconstructions of the cervical spine were also generated. COMPARISON:  MRI brain dated 12/29/2015. CT head dated 12/28/2015. CT neck dated 04/13/2014. FINDINGS: CT HEAD FINDINGS Brain: No evidence of acute infarction, hemorrhage, hydrocephalus, extra-axial collection or mass lesion/mass effect. Extensive subcortical white matter and periventricular small vessel ischemic changes. Intracranial atherosclerosis. Vascular: No hyperdense vessel or unexpected calcification. Skull: Normal. Negative for fracture or focal lesion. Sinuses/Orbits: Visualized paranasal  sinuses are clear. Partial opacification the left mastoid air cells (series 3/ image 5). Other: Global cortical and central atrophy. Secondary ventriculomegaly. CT CERVICAL SPINE FINDINGS Alignment: Reversal of the normal cervical lordosis. Skull base and vertebrae: No acute fracture. No primary bone lesion or focal pathologic process. Soft tissues and spinal canal: No prevertebral fluid or swelling. No visible canal hematoma. Disc levels:  Moderate multilevel degenerative changes. Spinal canal remains patent. Upper chest: Visualized lung apices are notable for emphysematous changes and layering bilateral pleural effusions. Other: None. IMPRESSION: No evidence of acute intracranial abnormality. Extensive small vessel ischemic changes. Atrophy with secondary ventriculomegaly. No evidence of traumatic injury to the cervical spine. Moderate multilevel degenerative changes. Layering bilateral pleural effusions, incompletely visualized. Electronically Signed   By: Charline Bills M.D.   On: 01/16/2016 12:17   Ct Cervical Spine Wo  Contrast  Result Date: 01/16/2016 CLINICAL DATA:  Multiple falls EXAM: CT HEAD WITHOUT CONTRAST CT CERVICAL SPINE WITHOUT CONTRAST TECHNIQUE: Multidetector CT imaging of the head and cervical spine was performed following the standard protocol without intravenous contrast. Multiplanar CT image reconstructions of the cervical spine were also generated. COMPARISON:  MRI brain dated 12/29/2015. CT head dated 12/28/2015. CT neck dated 04/13/2014. FINDINGS: CT HEAD FINDINGS Brain: No evidence of acute infarction, hemorrhage, hydrocephalus, extra-axial collection or mass lesion/mass effect. Extensive subcortical white matter and periventricular small vessel ischemic changes. Intracranial atherosclerosis. Vascular: No hyperdense vessel or unexpected calcification. Skull: Normal. Negative for fracture or focal lesion. Sinuses/Orbits: Visualized paranasal sinuses are clear. Partial opacification  the left mastoid air cells (series 3/ image 5). Other: Global cortical and central atrophy. Secondary ventriculomegaly. CT CERVICAL SPINE FINDINGS Alignment: Reversal of the normal cervical lordosis. Skull base and vertebrae: No acute fracture. No primary bone lesion or focal pathologic process. Soft tissues and spinal canal: No prevertebral fluid or swelling. No visible canal hematoma. Disc levels:  Moderate multilevel degenerative changes. Spinal canal remains patent. Upper chest: Visualized lung apices are notable for emphysematous changes and layering bilateral pleural effusions. Other: None. IMPRESSION: No evidence of acute intracranial abnormality. Extensive small vessel ischemic changes. Atrophy with secondary ventriculomegaly. No evidence of traumatic injury to the cervical spine. Moderate multilevel degenerative changes. Layering bilateral pleural effusions, incompletely visualized. Electronically Signed   By: Charline Bills M.D.   On: 01/16/2016 12:17      Scheduled Meds: . allopurinol  200 mg Oral Daily  . aspirin  325 mg Oral Daily  . calcitRIOL  0.25 mcg Oral QODAY  . calcium-vitamin D  1 tablet Oral Daily  . famotidine  20 mg Oral QHS  . furosemide  40 mg Intravenous BID  . heparin  5,000 Units Subcutaneous Q8H  . hydrALAZINE  50 mg Oral BID  . insulin aspart  0-9 Units Subcutaneous TID WC  . isosorbide mononitrate  60 mg Oral Daily  . levothyroxine  125 mcg Oral QAC breakfast  . metoprolol tartrate  12.5 mg Oral BID  . pravastatin  40 mg Oral QPM  . sevelamer carbonate  800 mg Oral TID WC  . sodium bicarbonate  650 mg Oral Daily  . sodium chloride flush  3 mL Intravenous Q12H  . [START ON 01/20/2016] Vitamin D (Ergocalciferol)  50,000 Units Oral Once per day on Wed Fri   Continuous Infusions:    LOS: 1 day    Time spent: 20 minutes    Debbora Presto, MD [facility] Pager [phone]  If 7PM-7AM, please contact night-coverage [email or web address] Password  [email or web address] 01/17/2016, 1:12 PM

## 2016-01-18 LAB — BASIC METABOLIC PANEL
Anion gap: 12 (ref 5–15)
BUN: 19 mg/dL (ref 6–20)
CALCIUM: 9.2 mg/dL (ref 8.9–10.3)
CO2: 23 mmol/L (ref 22–32)
CREATININE: 2.09 mg/dL — AB (ref 0.44–1.00)
Chloride: 107 mmol/L (ref 101–111)
GFR, EST AFRICAN AMERICAN: 23 mL/min — AB (ref >60)
GFR, EST NON AFRICAN AMERICAN: 20 mL/min — AB (ref >60)
Glucose, Bld: 111 mg/dL — ABNORMAL HIGH (ref 65–99)
Potassium: 4.8 mmol/L (ref 3.5–5.1)
SODIUM: 142 mmol/L (ref 135–145)

## 2016-01-18 LAB — GLUCOSE, CAPILLARY
GLUCOSE-CAPILLARY: 121 mg/dL — AB (ref 65–99)
GLUCOSE-CAPILLARY: 190 mg/dL — AB (ref 65–99)
Glucose-Capillary: 142 mg/dL — ABNORMAL HIGH (ref 65–99)
Glucose-Capillary: 179 mg/dL — ABNORMAL HIGH (ref 65–99)

## 2016-01-18 NOTE — Evaluation (Signed)
Occupational Therapy Evaluation Patient Details Name: Shannon SirenRoberta G [name] MRN: [id number] DOB: 10/21/1923 Today's Date: 01/18/2016    History of Present Illness 82 y.o. female, PMHx of prior TIA, Left BKA, PVD, CKD IV (she is not a dialysis patient), HTN, HLD, CAD, chronic diastolic heart failure, DM, with recent admission for acute CVA, discharged to rehabilitation, and now admitted for acute respiratory failure with hypoxia secondary to acute on chronic systolic CHF exacerbation   Clinical Impression   PT admitted with s/p fall x3 while at snf from bed level without bed rails per family and respiratory failure. Pt currently with functional limitiations due to the deficits listed below (see OT problem list). pta was total A but now able to dc with family. Pt will benefit from skilled OT to increase their independence and safety with adls and balance to allow discharge hhot.     Follow Up Recommendations  Other (comment);Home health OT (vs Palliative recommendations)    Equipment Recommendations  None recommended by OT (has all dme)    Recommendations for Other Services       Precautions / Restrictions Precautions Precautions: Fall Restrictions Weight Bearing Restrictions: No      Mobility Bed Mobility Overal bed mobility: Needs Assistance Bed Mobility: Supine to Sit Rolling: Mod assist Sidelying to sit: Mod assist Supine to sit: Mod assist   Sit to sidelying: Mod assist General bed mobility comments: son able to progress patient to eob without (A) of therapist   Transfers Overall transfer level: Needs assistance Equipment used: 1 person hand held assist       Squat pivot transfers: Total assist     General transfer comment: transfer R    Balance Overall balance assessment: Needs assistance Sitting-balance support: Bilateral upper extremity supported;Feet unsupported Sitting balance-Leahy Scale: Fair Sitting balance - Comments: pt is able to sit EOB with  min/guard for trunk for approx 1 minute however then fatigues and performs posterior lean requiring assist Postural control: Posterior lean                                  ADL Overall ADL's : Needs assistance/impaired     Grooming: Maximal assistance   Upper Body Bathing: Maximal assistance   Lower Body Bathing: Total assistance           Toilet Transfer: Maximal assistance             General ADL Comments: total (A) for all adls. Son was able to transfer pt to chair with stand pivot mod I . Pt helping by holding to son. Pt able to verbalzie patients name and nick name pistole. Pt able to recall her name. Pt unable to recall DOb or current month. pt is adequate level for d/c home with family     Vision Additional Comments: pt now scanning to the R side and L side throughout session   Perception     Praxis      Pertinent Vitals/Pain Pain Assessment: No/denies pain Faces Pain Scale: No hurt     Hand Dominance Right   Extremity/Trunk Assessment Upper Extremity Assessment Upper Extremity Assessment: Defer to OT evaluation RUE Deficits / Details: following commands decr grasp but able to squeeze 3 out 5    Lower Extremity Assessment Lower Extremity Assessment: Defer to PT evaluation RLE Deficits / Details: pt able to extend knee against gravity in sitting, limited DF, son reports pt unable to stand  without assist LLE Deficits / Details: hx L BKA    Cervical / Trunk Assessment Cervical / Trunk Assessment: Kyphotic   Communication Communication Communication: No difficulties   Cognition Arousal/Alertness: Awake/alert Behavior During Therapy: WFL for tasks assessed/performed (less flat affect with son present) Overall Cognitive Status: History of cognitive impairments - at baseline (hx memory loss)                     General Comments       Exercises Exercises:  (general PROM exercise to warm up.)     Shoulder Instructions       Home Living Family/patient expects to be discharged to:: Private residence Living Arrangements: Children Available Help at Discharge: Family;Available 24 hours/day Type of Home: House Home Access: Ramped entrance     Home Layout: One level     Bathroom Shower/Tub: Chief Strategy Officer: Standard Bathroom Accessibility: No   Home Equipment: Environmental consultant - 2 wheels;Bedside commode;Tub bench;Hospital bed;Wheelchair - IT trainer (hoyer lift attached to bed per son)   Additional Comments: son reports having hoyer lift but prefer to transfer her personally      Prior Functioning/Environment Level of Independence: Needs assistance  Gait / Transfers Assistance Needed: son reports progressive decline for pivot transfers, states pt was requiring 65% assist and then increased to 80% prior to last admission ADL's / Homemaking Assistance Needed: son bathes and dresses her as well as all the housework            OT Problem List: Decreased strength;Decreased activity tolerance;Impaired balance (sitting and/or standing);Decreased cognition;Decreased safety awareness;Decreased knowledge of use of DME or AE;Cardiopulmonary status limiting activity;Decreased knowledge of precautions;Decreased range of motion;Impaired vision/perception   OT Treatment/Interventions: Self-care/ADL training;Therapeutic exercise;Neuromuscular education;DME and/or AE instruction;Therapeutic activities;Cognitive remediation/compensation;Visual/perceptual remediation/compensation;Patient/family education;Balance training    OT Goals(Current goals can be found in the care plan section) Acute Rehab OT Goals Patient Stated Goal: that my mom home OT Goal Formulation: With family Time For Goal Achievement: 02/01/16 Potential to Achieve Goals: Fair  OT Frequency: Min 2X/week   Barriers to D/C:            Co-evaluation              End of Session Nurse Communication: Mobility  status;Precautions;Need for lift equipment  Activity Tolerance: Patient tolerated treatment well Patient left: with call bell/phone within reach;with family/visitor present;in chair;with chair alarm set   Time: 1211-1226 OT Time Calculation (min): 15 min Charges:  OT General Charges $OT Visit: 1 Procedure OT Evaluation $OT Eval Moderate Complexity: 1 Procedure G-Codes:    Boone Master B 2016-02-01, 1:16 PM  Mateo Flow   OTR/L Pager: [phone] Office: [phone] .

## 2016-01-18 NOTE — Evaluation (Signed)
Physical Therapy Evaluation Patient Details Name: [name] MRN: [id number] DOB: [birth year] Today's Date: 01/18/2016   History of Present Illness  82 y.o. female, PMHx of prior TIA, Left BKA, PVD, CKD IV (she is not a dialysis patient), HTN, HLD, CAD, chronic diastolic heart failure, DM, with recent admission for acute CVA, discharged to rehabilitation, and now admitted for acute respiratory failure with hypoxia secondary to acute on chronic systolic CHF exacerbation  Clinical Impression  Pt admitted with above diagnosis. Pt currently with functional limitations due to the deficits listed below (see PT Problem List).  Pt will benefit from skilled PT to increase their independence and safety with mobility to allow discharge to the venue listed below.  Pt assisted with rolling and changing bed linen and then sat EOB briefly (pt fatigued quickly).  Son present and reports he does not wish her to return to SNF, plans to bring pt home.  Pt has all equipment including hospital bed and lift for transfers.  Son agreeable to HHPT upon d/c.     Follow Up Recommendations Supervision/Assistance - 24 hour;Home health PT    Equipment Recommendations  None recommended by PT    Recommendations for Other Services       Precautions / Restrictions Precautions Precautions: Fall Restrictions Weight Bearing Restrictions: No      Mobility  Bed Mobility Overal bed mobility: Needs Assistance;+2 for physical assistance Bed Mobility: Rolling;Sidelying to Sit;Sit to Sidelying Rolling: Mod assist;Max assist Sidelying to sit: Mod assist     Sit to sidelying: Mod assist General bed mobility comments: pt required cues to self assist, son present and encouraged pt, rolling for linen change and hygiene (bed saturated with urine on arrival), pt then assisted with sitting EOB  Transfers                    Ambulation/Gait                Stairs            Wheelchair Mobility     Modified Rankin (Stroke Patients Only)       Balance Overall balance assessment: Needs assistance Sitting-balance support: Bilateral upper extremity supported;Feet supported Sitting balance-Leahy Scale: Fair Sitting balance - Comments: pt is able to sit EOB with min/guard for trunk for approx 1 minute however then fatigues and performs posterior lean requiring assist Postural control: Posterior lean                                   Pertinent Vitals/Pain Pain Assessment: No/denies pain    Home Living Family/patient expects to be discharged to:: Private residence Living Arrangements: Children Available Help at Discharge: Family;Available 24 hours/day Type of Home: House Home Access: Ramped entrance     Home Layout: One level Home Equipment: Walker - 2 wheels;Bedside commode;Tub bench;Hospital bed;Wheelchair - IT trainer (hoyer lift attached to bed per son) Additional Comments: son reports having hoyer lift but prefer to transfer her personally    Prior Function Level of Independence: Needs assistance   Gait / Transfers Assistance Needed: son reports progressive decline for pivot transfers, states pt was requiring 65% assist and then increased to 80% prior to last admission  ADL's / Homemaking Assistance Needed: son bathes and dresses her as well as all the housework        Hand Dominance        Extremity/Trunk Assessment  Upper Extremity Assessment: Defer to OT evaluation           Lower Extremity Assessment: RLE deficits/detail;LLE deficits/detail RLE Deficits / Details: pt able to extend knee against gravity in sitting, limited DF, son reports pt unable to stand without assist LLE Deficits / Details: hx L BKA, knee flexion contracture      Communication   Communication: No difficulties  Cognition Arousal/Alertness: Awake/alert Behavior During Therapy: WFL for tasks assessed/performed (less flat affect with son  present) Overall Cognitive Status: History of cognitive impairments - at baseline (hx memory loss)                      General Comments      Exercises     Assessment/Plan    PT Assessment Patient needs continued PT services  PT Problem List Decreased strength;Decreased activity tolerance;Decreased balance;Decreased mobility          PT Treatment Interventions Functional mobility training;Therapeutic activities;Therapeutic exercise;Balance training;Neuromuscular re-education;Patient/family education    PT Goals (Current goals can be found in the Care Plan section)  Acute Rehab PT Goals PT Goal Formulation: With patient/family Time For Goal Achievement: 01/25/16 Potential to Achieve Goals: Fair    Frequency Min 3X/week   Barriers to discharge        Co-evaluation               End of Session   Activity Tolerance: Patient limited by fatigue Patient left: in bed;with call bell/phone within reach;with bed alarm set;with family/visitor present           Time: 0939-1005 PT Time Calculation (min) (ACUTE ONLY): 26 min   Charges:   PT Evaluation $PT Eval Moderate Complexity: 1 Procedure     PT G Codes:        Caelan Branden,KATHrine E 01/18/2016, 12:30 PM Zenovia JarredKati Jhon Mallozzi, PT, DPT 01/18/2016 Pager: [phone]

## 2016-01-18 NOTE — Progress Notes (Signed)
Spoke with pt concerning and son [name] at bedside concerning discharge plans. Pt states she is going home with her son [name].  Both selected [facility] for HHRN/PT/NA. Referral given to in house rep.

## 2016-01-18 NOTE — Progress Notes (Addendum)
Patient ID: [name], female   DOB: September 18, 1923, 82 y.o.   MRN: [id number]    PROGRESS NOTE    KORYN FRETZ  [id number]:[id number] DOB: [birth year] DOA: 01/16/2016  PCP: Georgianne Fick, MD   Brief Narrative:  82 y.o. female, a history of prior TIA, Left BKA, PVD, CKD IV (she is not a dialysis patient), HTN, HLD, CAD, chronic diastolic heart failure, DM , with recent admission for acute CVA, discharged to rehabilitation, patient was brought for shortness of breath, patient is very poor historian, history was obtained from both daughters at bedside, they report patient has been complaining of progressively worsening shortness of breath over the last few days, associated with upper and lower extremity swelling, 2 episodes of unwitnessed falls.  Assessment & Plan:  Acute respiratory failure with hypoxia secondary to acute on chronic systolic CHF exacerbation - recent ECHO 12/2015 with EF 30 - 35% - pt looks clinically stable this AM - pt currently on Lasix 40 mg IV BID - will continue same regimen, no need for repeat ECHO this time - continue to monitor daily weights, strict I/O - weight trend since admission 124 --> 123 lbs   Essential HTN - reasonable inpatient control  - pt on Imdur, Hydralazine, Lasix - continue same regimen  HLD - based on recent lipid panel pt has mixed hyperlipidemia as per lipid panel, cholesterol, LDL, triglycerides all elevated  - continue statin   CKD stage IV - with baseline Cr ~2 - monitor closely while on IV Lasix  - Cr is ~ baseline  - BMP in AM  Hypothyroidism  - iatrogenic   DM type II with complications of nephropathy - continue with SSI until oral intake improves   Anemia of chronic disease - due to CKD - no signs of active bleeding - CBC in AM  DVT prophylaxis: Heparin SQ Code Status: DNR Family Communication: Patient at bedside, son at bedside  Disposition Plan: To be determined   Consultants:   None  Procedures:    None  Antimicrobials:   None  Subjective: No events overnight.   Objective: Vitals:   01/17/16 1223 01/17/16 1943 01/17/16 2232 01/18/16 0453  BP: (!) 164/77 (!) 153/62 (!) 158/67 (!) 146/65  Pulse: 95 79 77 76  Resp:  18 20 20   Temp: 98.2 F (36.8 C) 98 F (36.7 C) 97.6 F (36.4 C) 97.3 F (36.3 C)  TempSrc: Oral Oral Oral Oral  SpO2: 96% 98% 100% 100%  Weight:    56.1 kg (123 lb 10.9 oz)  Height:        Intake/Output Summary (Last 24 hours) at 01/18/16 1430 Last data filed at 01/18/16 1200  Gross per 24 hour  Intake              240 ml  Output                0 ml  Net              240 ml   Filed Weights   01/16/16 1434 01/18/16 0453  Weight: 56.3 kg (124 lb 1.9 oz) 56.1 kg (123 lb 10.9 oz)    Examination:  General exam: Appears calm and comfortable  Respiratory system: Crackles at bases still noted but overall better  Cardiovascular system: RRR. No JVD, murmurs, rubs, gallops or clicks. Left AKA Gastrointestinal system: Abdomen is nondistended, soft and nontender. No organomegaly or masses felt.  Central nervous system: Alert and oriented.   Data Reviewed: I  have personally reviewed following labs and imaging studies  CBC:  Recent Labs Lab 01/16/16 1151  WBC 6.6  NEUTROABS 4.3  HGB 9.5*  HCT 30.8*  MCV 91.4  PLT 270   Basic Metabolic Panel:  Recent Labs Lab 01/16/16 1151 01/17/16 0552 01/18/16 0621  NA 144 143 142  K 4.5 3.9 4.8  CL 110 109 107  CO2 24 24 23   GLUCOSE 105* 105* 111*  BUN 19 18 19   CREATININE 1.98* 2.12* 2.09*  CALCIUM 8.9 8.8* 9.2  MG  --  2.0  --    Coagulation Profile:  Recent Labs Lab 01/16/16 1151  INR 1.12   CBG:  Recent Labs Lab 01/17/16 1233 01/17/16 1711 01/17/16 2227 01/18/16 0750 01/18/16 1205  GLUCAP 137* 132* 116* 121* 190*   Urine analysis:    Component Value Date/Time   COLORURINE YELLOW 01/16/2016 1243   APPEARANCEUR CLOUDY (A) 01/16/2016 1243   LABSPEC 1.017 01/16/2016 1243    PHURINE 5.5 01/16/2016 1243   GLUCOSEU NEGATIVE 01/16/2016 1243   HGBUR NEGATIVE 01/16/2016 1243   BILIRUBINUR SMALL (A) 01/16/2016 1243   KETONESUR NEGATIVE 01/16/2016 1243   PROTEINUR 100 (A) 01/16/2016 1243   UROBILINOGEN 0.2 08/18/2013 1807   NITRITE NEGATIVE 01/16/2016 1243   LEUKOCYTESUR NEGATIVE 01/16/2016 1243   Recent Results (from the past 240 hour(s))  MRSA PCR Screening     Status: None   Collection Time: 01/16/16  3:02 PM  Result Value Ref Range Status   MRSA by PCR NEGATIVE NEGATIVE Final    Comment:        The GeneXpert MRSA Assay (FDA approved for NASAL specimens only), is one component of a comprehensive MRSA colonization surveillance program. It is not intended to diagnose MRSA infection nor to guide or monitor treatment for MRSA infections.       Radiology Studies: No results found.    Scheduled Meds: . allopurinol  200 mg Oral Daily  . aspirin  325 mg Oral Daily  . calcitRIOL  0.25 mcg Oral QODAY  . calcium-vitamin D  1 tablet Oral Daily  . famotidine  20 mg Oral QHS  . furosemide  40 mg Intravenous BID  . heparin  5,000 Units Subcutaneous Q8H  . hydrALAZINE  50 mg Oral BID  . insulin aspart  0-9 Units Subcutaneous TID WC  . isosorbide mononitrate  60 mg Oral Daily  . levothyroxine  125 mcg Oral QAC breakfast  . metoprolol tartrate  12.5 mg Oral BID  . pravastatin  40 mg Oral QPM  . sevelamer carbonate  800 mg Oral TID WC  . sodium bicarbonate  650 mg Oral Daily  . sodium chloride flush  3 mL Intravenous Q12H  . [START ON 01/20/2016] Vitamin D (Ergocalciferol)  50,000 Units Oral Once per day on Wed Fri   Continuous Infusions:    LOS: 2 days    Time spent: 20 minutes    Debbora PrestoMAGICK-Stpehen Petitjean, MD [facility] Pager [phone]  If 7PM-7AM, please contact night-coverage [email or web address] Password [email or web address] 01/18/2016, 2:30 PM

## 2016-01-19 LAB — GLUCOSE, CAPILLARY
Glucose-Capillary: 101 mg/dL — ABNORMAL HIGH (ref 65–99)
Glucose-Capillary: 147 mg/dL — ABNORMAL HIGH (ref 65–99)
Glucose-Capillary: 119 mg/dL — ABNORMAL HIGH (ref 65–99)
Glucose-Capillary: 109 mg/dL — ABNORMAL HIGH (ref 65–99)

## 2016-01-19 LAB — BASIC METABOLIC PANEL
ANION GAP: 10 (ref 5–15)
BUN: 18 mg/dL (ref 6–20)
CALCIUM: 9.3 mg/dL (ref 8.9–10.3)
CO2: 30 mmol/L (ref 22–32)
CREATININE: 2.21 mg/dL — AB (ref 0.44–1.00)
Chloride: 99 mmol/L — ABNORMAL LOW (ref 101–111)
GFR, EST AFRICAN AMERICAN: 21 mL/min — AB (ref >60)
GFR, EST NON AFRICAN AMERICAN: 18 mL/min — AB (ref >60)
GLUCOSE: 112 mg/dL — AB (ref 65–99)
Potassium: 3.7 mmol/L (ref 3.5–5.1)
Sodium: 139 mmol/L (ref 135–145)

## 2016-01-19 MED ORDER — AMLODIPINE BESYLATE 10 MG PO TABS: 10.0000 mg | ORAL_TABLET | Freq: Every day | ORAL | Status: DC

## 2016-01-19 NOTE — Progress Notes (Addendum)
Patient ID: [name], female   DOB: 08/14/1923, 82 y.o.   MRN: [id number]    PROGRESS NOTE    [name]  TGY:[id number] DOB: 05-29-1923 DOA: 01/16/2016  PCP: Georgianne Fick, [name]   Brief Narrative:  82 y.o. female, a history of prior TIA, Left BKA, PVD, CKD IV (she is not a dialysis patient), HTN, HLD, CAD, chronic diastolic heart failure, DM , with recent admission for acute CVA, discharged to rehabilitation, patient was brought for shortness of breath, patient is very poor historian, history was obtained from both daughters at bedside, they report patient has been complaining of progressively worsening shortness of breath over the last few days, associated with upper and lower extremity swelling, 2 episodes of unwitnessed falls.  Assessment & Plan:  Acute respiratory failure with hypoxia secondary to acute on chronic systolic CHF exacerbation - recent ECHO 12/2015 with EF 30 - 35% - pt looks clinically stable this AM - pt currently on Lasix 40 mg IV BID, will change to PO Lasix today and see how pt does  - no need for repeat ECHO this time - continue to monitor daily weights, strict I/O - weight trend since admission 124 --> 123 lbs   Essential HTN - reasonable inpatient control  - pt on Imdur, Hydralazine, Lasix - would hold Norvasc as BP is stable on current regimen  - continue same regimen for now   HLD - based on recent lipid panel pt has mixed hyperlipidemia as per lipid panel, cholesterol, LDL, triglycerides all elevated  - continue statin   CKD stage IV - with baseline Cr ~2 - Cr is ~ baseline, will change Lasix to PO today  - BMP in AM  Hypothyroidism  - iatrogenic   DM type II with complications of nephropathy - resume home medical regimen   Anemia of chronic disease - due to CKD - no signs of active bleeding - CBC in AM  DVT prophylaxis: Heparin SQ Code Status: DNR Family Communication: Patient at bedside, son at bedside  Disposition Plan:  son wants her to go home with him and suspect we can discharge in AM if pt clinically stable   Consultants:   None  Procedures:   None  Antimicrobials:   None  Subjective: No events overnight.   Objective: Vitals:   01/17/16 2232 01/18/16 0453 01/18/16 1500 01/19/16 0435  BP: (!) 158/67 (!) 146/65 131/71 130/60  Pulse: 77 76    Resp: 20 20 20 20   Temp: 97.6 F (36.4 C) 97.3 F (36.3 C) 97.7 F (36.5 C) 97.3 F (36.3 C)  TempSrc: Oral Oral Oral Oral  SpO2: 100% 100% 97% 96%  Weight:  56.1 kg (123 lb 10.9 oz)    Height:        Intake/Output Summary (Last 24 hours) at 01/19/16 1316 Last data filed at 01/19/16 0900  Gross per 24 hour  Intake              240 ml  Output                0 ml  Net              240 ml   Filed Weights   01/16/16 1434 01/18/16 0453  Weight: 56.3 kg (124 lb 1.9 oz) 56.1 kg (123 lb 10.9 oz)    Examination:  General exam: Appears calm and comfortable  Respiratory system: Crackles at bases still noted but overall better  Cardiovascular system: RRR. No JVD,  murmurs, rubs, gallops or clicks. Left AKA Gastrointestinal system: Abdomen is nondistended, soft and nontender. No organomegaly or masses felt.  Central nervous system: Alert and oriented.   Data Reviewed: I have personally reviewed following labs and imaging studies  CBC:  Recent Labs Lab 01/16/16 1151  WBC 6.6  NEUTROABS 4.3  HGB 9.5*  HCT 30.8*  MCV 91.4  PLT 270   Basic Metabolic Panel:  Recent Labs Lab 01/16/16 1151 01/17/16 0552 01/18/16 0621 01/19/16 0349  NA 144 143 142 139  K 4.5 3.9 4.8 3.7  CL 110 109 107 99*  CO2 24 24 23 30   GLUCOSE 105* 105* 111* 112*  BUN 19 18 19 18   CREATININE 1.98* 2.12* 2.09* 2.21*  CALCIUM 8.9 8.8* 9.2 9.3  MG  --  2.0  --   --    Coagulation Profile:  Recent Labs Lab 01/16/16 1151  INR 1.12   CBG:  Recent Labs Lab 01/18/16 1205 01/18/16 1646 01/18/16 2237 01/19/16 0800 01/19/16 1228  GLUCAP 190* 142* 179*  101* 147*   Urine analysis:    Component Value Date/Time   COLORURINE YELLOW 01/16/2016 1243   APPEARANCEUR CLOUDY (A) 01/16/2016 1243   LABSPEC 1.017 01/16/2016 1243   PHURINE 5.5 01/16/2016 1243   GLUCOSEU NEGATIVE 01/16/2016 1243   HGBUR NEGATIVE 01/16/2016 1243   BILIRUBINUR SMALL (A) 01/16/2016 1243   KETONESUR NEGATIVE 01/16/2016 1243   PROTEINUR 100 (A) 01/16/2016 1243   UROBILINOGEN 0.2 08/18/2013 1807   NITRITE NEGATIVE 01/16/2016 1243   LEUKOCYTESUR NEGATIVE 01/16/2016 1243   Recent Results (from the past 240 hour(s))  MRSA PCR Screening     Status: None   Collection Time: 01/16/16  3:02 PM  Result Value Ref Range Status   MRSA by PCR NEGATIVE NEGATIVE Final    Comment:        The GeneXpert MRSA Assay (FDA approved for NASAL specimens only), is one component of a comprehensive MRSA colonization surveillance program. It is not intended to diagnose MRSA infection nor to guide or monitor treatment for MRSA infections.       Radiology Studies: No results found.    Scheduled Meds: . allopurinol  200 mg Oral Daily  . aspirin  325 mg Oral Daily  . calcitRIOL  0.25 mcg Oral QODAY  . calcium-vitamin D  1 tablet Oral Daily  . famotidine  20 mg Oral QHS  . furosemide  40 mg Intravenous BID  . heparin  5,000 Units Subcutaneous Q8H  . hydrALAZINE  50 mg Oral BID  . insulin aspart  0-9 Units Subcutaneous TID WC  . isosorbide mononitrate  60 mg Oral Daily  . levothyroxine  125 mcg Oral QAC breakfast  . metoprolol tartrate  12.5 mg Oral BID  . pravastatin  40 mg Oral QPM  . sevelamer carbonate  800 mg Oral TID WC  . sodium bicarbonate  650 mg Oral Daily  . sodium chloride flush  3 mL Intravenous Q12H  . [START ON 01/20/2016] Vitamin D (Ergocalciferol)  50,000 Units Oral Once per day on Wed Fri   Continuous Infusions:    LOS: 3 days    Time spent: 20 minutes    Shannon PrestoMAGICK-Meggan Dhaliwal, [name] [facility] Pager [phone]  If 7PM-7AM, please contact  night-coverage [email or web address] Password [email or web address] 01/19/2016, 1:16 PM

## 2016-01-20 DIAGNOSIS — I5023 Acute on chronic systolic (congestive) heart failure: Secondary | ICD-10-CM

## 2016-01-20 DIAGNOSIS — N184 Chronic kidney disease, stage 4 (severe): Secondary | ICD-10-CM

## 2016-01-20 DIAGNOSIS — I2510 Atherosclerotic heart disease of native coronary artery without angina pectoris: Secondary | ICD-10-CM

## 2016-01-20 LAB — GLUCOSE, CAPILLARY
GLUCOSE-CAPILLARY: 101 mg/dL — AB (ref 65–99)
GLUCOSE-CAPILLARY: 121 mg/dL — AB (ref 65–99)
Glucose-Capillary: 138 mg/dL — ABNORMAL HIGH (ref 65–99)

## 2016-01-20 LAB — CBC
HEMATOCRIT: 39.1 % (ref 36.0–46.0)
Hemoglobin: 12.5 g/dL (ref 12.0–15.0)
MCH: 28.2 pg (ref 26.0–34.0)
MCHC: 32 g/dL (ref 30.0–36.0)
MCV: 88.1 fL (ref 78.0–100.0)
PLATELETS: 305 10*3/uL (ref 150–400)
RBC: 4.44 MIL/uL (ref 3.87–5.11)
RDW: 18.5 % — AB (ref 11.5–15.5)
WBC: 7.5 10*3/uL (ref 4.0–10.5)

## 2016-01-20 LAB — BASIC METABOLIC PANEL
Anion gap: 14 (ref 5–15)
BUN: 20 mg/dL (ref 6–20)
CHLORIDE: 101 mmol/L (ref 101–111)
CO2: 26 mmol/L (ref 22–32)
CREATININE: 2.23 mg/dL — AB (ref 0.44–1.00)
Calcium: 9.1 mg/dL (ref 8.9–10.3)
GFR calc Af Amer: 21 mL/min — ABNORMAL LOW (ref >60)
GFR calc non Af Amer: 18 mL/min — ABNORMAL LOW (ref >60)
Glucose, Bld: 104 mg/dL — ABNORMAL HIGH (ref 65–99)
POTASSIUM: 4.7 mmol/L (ref 3.5–5.1)
Sodium: 141 mmol/L (ref 135–145)

## 2016-01-20 LAB — PHOSPHORUS: Phosphorus: 3 mg/dL (ref 2.5–4.6)

## 2016-01-20 MED ORDER — FUROSEMIDE 20 MG PO TABS: 20.0000 mg | ORAL_TABLET | Freq: Every day | ORAL | 0 refills | Status: DC

## 2016-01-20 NOTE — Discharge Summary (Signed)
Physician Discharge Summary  [name] TSV:[id number] DOB: [birth year] DOA: 01/16/2016  PCP: Georgianne Fick, MD  Admit date: 01/16/2016 Discharge date: 01/20/2016  Admitted From: SNF Disposition:  Home  Recommendations for Outpatient Follow-up:  1. Follow up with PCP in 1 week 2. Please take new medication as prescribed 3. Home health has been set up for you 4. Please obtain BMP/CBC in one week 5. Discuss with your primary care physician insulin   Home Health: Yes- PT/OT/Aide and RN Equipment/Devices: None  Discharge Condition: Guarded CODE STATUS: DNR- this was clarified with patient's son and per the admission note Diet recommendation: Heart Healthy / Carb Modified    Brief/Interim Summary: 82 y.o.female,a history of prior TIA, Left BKA, PVD, CKD IV (she is not a dialysis patient), HTN, HLD, CAD, chronic diastolic heart failure, DM , with recent admission for acute CVA, discharged to rehabilitation, patient was brought for shortness of breath, patient is very poor historian, history was obtained from both daughters at bedside, they report patient has been complaining of progressively worsening shortness of breath over the last few days, associated with upper and lower extremity swelling, 2 episodes of unwitnessed falls.    Discharge Diagnoses:  Active Problems:   CAD - moderate at cath July 2012 (medical Rx)   Hypothyroid   Dyslipidemia   Chronic renal insufficiency, stage IV (severe)- HD started 01/05/13   Diabetes mellitus type 2 with peripheral artery disease (HCC)   Uncontrolled hypertension   Dysphagia   Cerebrovascular accident (CVA) involving left middle cerebral artery territory [facility])   Acute on chronic systolic CHF (congestive heart failure) (HCC)  Acute respiratory failure with hypoxia secondary to acute on chronic systolic CHF exacerbation - recent ECHO 12/2015 with EF 30 - 35% - pt looks clinically stable this AM - pt previously Lasix 40 mg IV BID -  will change to 20mg  PO Lasix on discharge with instructions to weigh daily and to follow up with PCP and on bloodwork  - no need for repeat ECHO this time - continue to monitor daily weights, strict I/O - weight trend since admission 124 --> 111 lbs   Essential HTN - reasonable inpatient control  - pt on Imdur, Hydralazine, Lasix - would hold Norvasc as BP is stable on current regimen  - continue same regimen for now  - continue low dose lasix outpatient  HLD - based on recent lipid panel pt has mixed hyperlipidemia as per lipid panel, cholesterol, LDL, triglycerides all elevated  - continue pravastatin   CKD stage IV - with baseline Cr ~2 - Cr is ~ baseline, will change Lasix to PO today  - Cr is stable around 2 - will encouraged repeat BMP in 1 week  Hypothyroidism  - iatrogenic   DM type II with complications of nephropathy - patient received only 1 unit of insulin yesterday - patient to be discharged without insulin as she was only previously on sliding scale - will need to discuss with PCP insulin  Anemia of chronic disease - due to CKD - no signs of active bleeding  Discharge Instructions  Discharge Instructions    Call MD for:  difficulty breathing, headache or visual disturbances    Complete by:  As directed    Call MD for:  extreme fatigue    Complete by:  As directed    Call MD for:  persistant dizziness or light-headedness    Complete by:  As directed    Call MD for:  persistant nausea and vomiting  Complete by:  As directed    Call MD for:  redness, tenderness, or signs of infection (pain, swelling, redness, odor or green/yellow discharge around incision site)    Complete by:  As directed    Call MD for:  severe uncontrolled pain    Complete by:  As directed    Call MD for:  temperature >100.4    Complete by:  As directed    Diet - low sodium heart healthy    Complete by:  As directed    Diet Carb Modified    Complete by:  As directed     Increase activity slowly    Complete by:  As directed        Medication List    STOP taking these medications   amLODipine 10 MG tablet Commonly known as:  NORVASC   insulin aspart 100 UNIT/ML injection Commonly known as:  novoLOG     TAKE these medications   acetaminophen 325 MG tablet Commonly known as:  TYLENOL Take 650 mg by mouth every 6 (six) hours as needed for mild pain.   albuterol-ipratropium 18-103 MCG/ACT inhaler Commonly known as:  COMBIVENT Inhale 2 puffs into the lungs at bedtime as needed for wheezing or shortness of breath.   allopurinol 100 MG tablet Commonly known as:  ZYLOPRIM Take 200 mg by mouth daily.   aspirin 325 MG tablet Take 1 tablet (325 mg total) by mouth daily.   calcitRIOL 0.25 MCG capsule Commonly known as:  ROCALTROL Take 0.25 mcg by mouth every other day.   calcium-vitamin D 500-200 MG-UNIT tablet Take 1 tablet by mouth daily.   docusate sodium 100 MG capsule Commonly known as:  COLACE Take 100 mg by mouth daily as needed for mild constipation.   famotidine 20 MG tablet Commonly known as:  PEPCID Take 1 tablet (20 mg total) by mouth at bedtime.   NUTRI-DRINK PO Take 1 Can by mouth 3 (three) times daily. Med Pass 1.7   feeding supplement Liqd Take 1 Container by mouth 3 (three) times daily between meals.   furosemide 20 MG tablet Commonly known as:  LASIX Take 1 tablet (20 mg total) by mouth daily.   hydrALAZINE 50 MG tablet Commonly known as:  APRESOLINE Take 1 tablet (50 mg total) by mouth 2 (two) times daily.   isosorbide mononitrate 60 MG 24 hr tablet Commonly known as:  IMDUR Take 60 mg by mouth daily.   levothyroxine 125 MCG tablet Commonly known as:  SYNTHROID, LEVOTHROID Take 125 mcg by mouth daily before breakfast. Take for 2 weeks What changed:  Another medication with the same name was removed. Continue taking this medication, and follow the directions you see here.   metoCLOPramide 5 MG tablet Commonly  known as:  REGLAN Take 1 tablet (5 mg total) by mouth every 6 (six) hours as needed for nausea.   metoprolol tartrate 25 MG tablet Commonly known as:  LOPRESSOR Take 12.5 mg by mouth 2 (two) times daily.   OXYGEN Inhale 2 L into the lungs continuous. 2 L per minute via nasal cannula   pravastatin 40 MG tablet Commonly known as:  PRAVACHOL Take 40 mg by mouth every evening.   RENVELA 800 MG tablet Generic drug:  sevelamer carbonate Take 800 mg by mouth 3 (three) times daily with meals.   sodium bicarbonate 650 MG tablet Take 1 tablet (650 mg total) by mouth daily.   Vitamin D (Ergocalciferol) 50000 units Caps capsule Commonly known as:  DRISDOL  Take 50,000 Units by mouth 2 (two) times a week. On Wednesdays and Fridays      Follow-up Information    RAMACHANDRAN,AJITH, MD. Go on 01/26/2016.   Specialty:  Internal Medicine Why:  appointment 11/7 at 2:30 PM Contact information: [address] [place] [address] [phone]          Allergies  Allergen Reactions  . Aspirin Nausea And Vomiting    325 mg (adult strength) [name]     Consultations:  None   Procedures/Studies: Dg Chest 2 View  Result Date: 01/16/2016 CLINICAL DATA:  Shortness of breath EXAM: CHEST  2 VIEW COMPARISON:  12/31/2015 chest radiograph. FINDINGS: Stable cardiomediastinal silhouette with mild cardiomegaly and aortic atherosclerosis. No pneumothorax. Small bilateral pleural effusions, increased on the left. Mild pulmonary edema. Bibasilar lung opacities, favor atelectasis. IMPRESSION: 1. Mild congestive heart failure. 2. Small bilateral pleural effusions, increased on the left. 3. Bibasilar lung opacities, favor atelectasis . 4. Aortic atherosclerosis. Electronically Signed   By: Delbert Phenix M.D.   On: 01/16/2016 12:31   Dg Chest 2 View  Result Date: 12/29/2015 CLINICAL DATA:  82 y/o F; altered mental status and  possible stroke. EXAM: CHEST  2 VIEW COMPARISON:  06/22/2014 chest radiograph. FINDINGS: Stable cardiomegaly given projection and technique. Aortic atherosclerosis with arch calcifications. Bibasilar reticular opacities may represent scarring or atelectasis. No focal consolidation. No pneumothorax. No pleural effusion. No acute osseous abnormality is identified. IMPRESSION: No active cardiopulmonary disease. Electronically Signed   By: Mitzi Hansen M.D.   On: 12/29/2015 01:03   Ct Head Wo Contrast  Result Date: 01/16/2016 CLINICAL DATA:  Multiple falls EXAM: CT HEAD WITHOUT CONTRAST CT CERVICAL SPINE WITHOUT CONTRAST TECHNIQUE: Multidetector CT imaging of the head and cervical spine was performed following the standard protocol without intravenous contrast. Multiplanar CT image reconstructions of the cervical spine were also generated. COMPARISON:  MRI brain dated 12/29/2015. CT head dated 12/28/2015. CT neck dated 04/13/2014. FINDINGS: CT HEAD FINDINGS Brain: No evidence of acute infarction, hemorrhage, hydrocephalus, extra-axial collection or mass lesion/mass effect. Extensive subcortical white matter and periventricular small vessel ischemic changes. Intracranial atherosclerosis. Vascular: No hyperdense vessel or unexpected calcification. Skull: Normal. Negative for fracture or focal lesion. Sinuses/Orbits: Visualized paranasal sinuses are clear. Partial opacification the left mastoid air cells (series 3/ image 5). Other: Global cortical and central atrophy. Secondary ventriculomegaly. CT CERVICAL SPINE FINDINGS Alignment: Reversal of the normal cervical lordosis. Skull base and vertebrae: No acute fracture. No primary bone lesion or focal pathologic process. Soft tissues and spinal canal: No prevertebral fluid or swelling. No visible canal hematoma. Disc levels:  Moderate multilevel degenerative changes. Spinal canal remains patent. Upper chest: Visualized lung apices are notable for  emphysematous changes and layering bilateral pleural effusions. Other: None. IMPRESSION: No evidence of acute intracranial abnormality. Extensive small vessel ischemic changes. Atrophy with secondary ventriculomegaly. No evidence of traumatic injury to the cervical spine. Moderate multilevel degenerative changes. Layering bilateral pleural effusions, incompletely visualized. Electronically Signed   By: Charline Bills M.D.   On: 01/16/2016 12:17   Ct Head Wo Contrast  Result Date: 12/28/2015 CLINICAL DATA:  Code stroke, with right-sided hemineglect and altered mental status. Initial encounter. EXAM: CT HEAD WITHOUT CONTRAST TECHNIQUE: Contiguous axial images were obtained from the base of the skull through the vertex without intravenous contrast. COMPARISON:  CT of the head performed 06/15/2013 FINDINGS: Brain: No evidence of acute infarction, hemorrhage, extra-axial collection  or mass lesion/mass effect. There is diffuse prominence of the supratentorial ventricles, increased from the prior study, with suggestion of underlying transependymal resorption of CSF, concerning for mild hydrocephalus. Underlying mild cortical volume loss is noted. Mild cerebellar atrophy is noted. Scattered periventricular and subcortical white matter change likely reflects small vessel ischemic microangiopathy. The brainstem and fourth ventricle are within normal limits. The basal ganglia are unremarkable in appearance. The cerebral hemispheres demonstrate grossly normal gray-white differentiation. No mass effect or midline shift is seen. Vascular: No hyperdense vessel or unexpected calcification. Skull: There is no evidence of fracture; visualized osseous structures are unremarkable in appearance. Sinuses/Orbits: The orbits are within normal limits. The paranasal sinuses and mastoid air cells are well-aerated. Other: No significant soft tissue abnormalities are seen. IMPRESSION: 1. No acute intracranial pathology seen on CT. 2.  Increased diffuse prominence of the supratentorial ventricles, with suggestion of underlying transependymal resorption of CSF, concerning for mild hydrocephalus. 3. Underlying mild cortical volume loss and scattered small vessel ischemic microangiopathy noted. These results were called by telephone at the time of interpretation on 12/28/2015 at 10:46 pm to Dr. Roxy Mannsster, who verbally acknowledged these results. Electronically Signed   By: Roanna RaiderJeffery  Chang M.D.   On: 12/28/2015 22:49   Ct Cervical Spine Wo Contrast  Result Date: 01/16/2016 CLINICAL DATA:  Multiple falls EXAM: CT HEAD WITHOUT CONTRAST CT CERVICAL SPINE WITHOUT CONTRAST TECHNIQUE: Multidetector CT imaging of the head and cervical spine was performed following the standard protocol without intravenous contrast. Multiplanar CT image reconstructions of the cervical spine were also generated. COMPARISON:  MRI brain dated 12/29/2015. CT head dated 12/28/2015. CT neck dated 04/13/2014. FINDINGS: CT HEAD FINDINGS Brain: No evidence of acute infarction, hemorrhage, hydrocephalus, extra-axial collection or mass lesion/mass effect. Extensive subcortical white matter and periventricular small vessel ischemic changes. Intracranial atherosclerosis. Vascular: No hyperdense vessel or unexpected calcification. Skull: Normal. Negative for fracture or focal lesion. Sinuses/Orbits: Visualized paranasal sinuses are clear. Partial opacification the left mastoid air cells (series 3/ image 5). Other: Global cortical and central atrophy. Secondary ventriculomegaly. CT CERVICAL SPINE FINDINGS Alignment: Reversal of the normal cervical lordosis. Skull base and vertebrae: No acute fracture. No primary bone lesion or focal pathologic process. Soft tissues and spinal canal: No prevertebral fluid or swelling. No visible canal hematoma. Disc levels:  Moderate multilevel degenerative changes. Spinal canal remains patent. Upper chest: Visualized lung apices are notable for emphysematous  changes and layering bilateral pleural effusions. Other: None. IMPRESSION: No evidence of acute intracranial abnormality. Extensive small vessel ischemic changes. Atrophy with secondary ventriculomegaly. No evidence of traumatic injury to the cervical spine. Moderate multilevel degenerative changes. Layering bilateral pleural effusions, incompletely visualized. Electronically Signed   By: Charline BillsSriyesh  Krishnan M.D.   On: 01/16/2016 12:17   Mr Brain Wo Contrast  Result Date: 12/29/2015 CLINICAL DATA:  82 year old female with acute slurred speech, abnormal speech, decreased mental status, right side weakness and facial droop. Initial encounter. EXAM: MRI HEAD WITHOUT CONTRAST TECHNIQUE: Multiplanar, multiecho pulse sequences of the brain and surrounding structures were obtained without intravenous contrast. COMPARISON:  Head CT without contrast 11/29/2015. Brain MRI 07/05/2004. FINDINGS: The examination had to be discontinued prior to completion due to altered mental status; the ordering provider did not feel the patient was a candidate for MRI under sedation. Axial diffusion, T2, T1, and FLAIR imaging as well as coronal diffusion only was obtained. Brain: 3 cm area of cortical and white matter restricted diffusion in the inferior posterior left parietal and the lateral left occipital  lobe. No other restricted diffusion identified. Minimal to mild associated parenchymal T2 and FLAIR hyperintensity. No associated mass effect. No evidence of malignant hemorrhagic transformation. Progressed since 2006 and now confluent bilateral cerebral white matter T2 and FLAIR hyperintensity. Ventricular enlargement which is favored to be ex vacuo in nature. No midline shift or intracranial mass effect. No definite cortical encephalomalacia. Chronic left thalamic lacunar infarct. Vascular: Major intracranial vascular flow voids are grossly stable. Skull and upper cervical spine: Not well evaluated. Sinuses/Orbits: Visualized  paranasal sinuses and mastoids are stable and well pneumatized. Stable postoperative changes to the globes. Other: None. IMPRESSION: 1. Acute posterior left MCA or MCA/PCA watershed infarct with no associated hemorrhage or mass effect. 2. Progressed and widespread cerebral white matter signal abnormality since 2006, favor chronic small vessel disease related. Chronic left thalamic lacunar infarct. 3.  The examination had to be discontinued prior to completion. Electronically Signed   By: Odessa Fleming M.D.   On: 12/29/2015 11:26   Dg Chest Port 1 View  Result Date: 12/31/2015 CLINICAL DATA:  Wheezing today EXAM: PORTABLE CHEST 1 VIEW COMPARISON:  12/28/2015 FINDINGS: Stable cardiopericardial enlargement. Increased basilar opacities, asymmetric to the right. There is diffuse interstitial coarsening. No effusion or pneumothorax. IMPRESSION: New findings of CHF. Cannot exclude superimposed pneumonia at the bases. Electronically Signed   By: Marnee Spring M.D.   On: 12/31/2015 08:45       Subjective: Patient awake and alert.  Oriented to name.  Son is bedside.  Discussed discharge planning.  Family feels confident sending patient home.  Family denies any equipment needs at this time.  Discussed code status- family wants DNR.  Patient creatinine slightly increased this morning- will transition to a lower dose PO lasix on discharge.   Discharge Exam: Vitals:   01/20/16 1136 01/20/16 1510  BP: (!) 142/78 136/79  Pulse: 80 70  Resp:  18  Temp:  97.9 F (36.6 C)   Vitals:   01/20/16 0540 01/20/16 0701 01/20/16 1136 01/20/16 1510  BP: 129/68  (!) 142/78 136/79  Pulse: 65  80 70  Resp: 18   18  Temp: 98.2 F (36.8 C)   97.9 F (36.6 C)  TempSrc: Axillary   Oral  SpO2: 97%   100%  Weight:  50.8 kg (111 lb 15.9 oz)    Height:        General: Pt is alert, awake, not in acute distress Cardiovascular: RRR, S1/S2 +, no rubs, no gallops Respiratory: CTA bilaterally, no wheezing, no  rhonchi Abdominal: Soft, NT, ND, bowel sounds + Extremities: no edema, no cyanosis, left AKA    The results of significant diagnostics from this hospitalization (including imaging, microbiology, ancillary and laboratory) are listed below for reference.     Microbiology: Recent Results (from the past 240 hour(s))  MRSA PCR Screening     Status: None   Collection Time: 01/16/16  3:02 PM  Result Value Ref Range Status   MRSA by PCR NEGATIVE NEGATIVE Final    Comment:        The GeneXpert MRSA Assay (FDA approved for NASAL specimens only), is one component of a comprehensive MRSA colonization surveillance program. It is not intended to diagnose MRSA infection nor to guide or monitor treatment for MRSA infections.      Labs: BNP (last 3 results)  Recent Labs  01/16/16 1151  BNP 3,168.6*   Basic Metabolic Panel:  Recent Labs Lab 01/16/16 1151 01/17/16 0552 01/18/16 0621 01/19/16 0349 01/20/16 0530  NA  144 143 142 139 141  K 4.5 3.9 4.8 3.7 4.7  CL 110 109 107 99* 101  CO2 24 24 23 30 26   GLUCOSE 105* 105* 111* 112* 104*  BUN 19 18 19 18 20   CREATININE 1.98* 2.12* 2.09* 2.21* 2.23*  CALCIUM 8.9 8.8* 9.2 9.3 9.1  MG  --  2.0  --   --   --   PHOS  --   --   --   --  3.0   Liver Function Tests: No results for input(s): AST, ALT, ALKPHOS, BILITOT, PROT, ALBUMIN in the last 168 hours. No results for input(s): LIPASE, AMYLASE in the last 168 hours. No results for input(s): AMMONIA in the last 168 hours. CBC:  Recent Labs Lab 01/16/16 1151 01/20/16 0350  WBC 6.6 7.5  NEUTROABS 4.3  --   HGB 9.5* 12.5  HCT 30.8* 39.1  MCV 91.4 88.1  PLT 270 305   Cardiac Enzymes: No results for input(s): CKTOTAL, CKMB, CKMBINDEX, TROPONINI in the last 168 hours. BNP: Invalid input(s): POCBNP CBG:  Recent Labs Lab 01/19/16 1228 01/19/16 1730 01/19/16 2110 01/20/16 0801 01/20/16 1212  GLUCAP 147* 119* 109* 101* 138*   D-Dimer No results for input(s): DDIMER in  the last 72 hours. Hgb A1c No results for input(s): HGBA1C in the last 72 hours. Lipid Profile No results for input(s): CHOL, HDL, LDLCALC, TRIG, CHOLHDL, LDLDIRECT in the last 72 hours. Thyroid function studies No results for input(s): TSH, T4TOTAL, T3FREE, THYROIDAB in the last 72 hours.  Invalid input(s): FREET3 Anemia work up No results for input(s): VITAMINB12, FOLATE, FERRITIN, TIBC, IRON, RETICCTPCT in the last 72 hours. Urinalysis    Component Value Date/Time   COLORURINE YELLOW 01/16/2016 1243   APPEARANCEUR CLOUDY (A) 01/16/2016 1243   LABSPEC 1.017 01/16/2016 1243   PHURINE 5.5 01/16/2016 1243   GLUCOSEU NEGATIVE 01/16/2016 1243   HGBUR NEGATIVE 01/16/2016 1243   BILIRUBINUR SMALL (A) 01/16/2016 1243   KETONESUR NEGATIVE 01/16/2016 1243   PROTEINUR 100 (A) 01/16/2016 1243   UROBILINOGEN 0.2 08/18/2013 1807   NITRITE NEGATIVE 01/16/2016 1243   LEUKOCYTESUR NEGATIVE 01/16/2016 1243   Sepsis Labs Invalid input(s): PROCALCITONIN,  WBC,  LACTICIDVEN Microbiology Recent Results (from the past 240 hour(s))  MRSA PCR Screening     Status: None   Collection Time: 01/16/16  3:02 PM  Result Value Ref Range Status   MRSA by PCR NEGATIVE NEGATIVE Final    Comment:        The GeneXpert MRSA Assay (FDA approved for NASAL specimens only), is one component of a comprehensive MRSA colonization surveillance program. It is not intended to diagnose MRSA infection nor to guide or monitor treatment for MRSA infections.      Time coordinating discharge: Less than 30 minutes  SIGNED:   Bennett Scrape, MD  [facility] 01/20/2016, 4:07 PM Pager [phone] If 7PM-7AM, please contact night-coverage [email or web address] Password [email or web address]

## 2016-01-20 NOTE — Progress Notes (Signed)
Physical Therapy Treatment Patient Details Name: [name] MRN: [id number] DOB: Nov 24, 1923 Today's Date: 01/20/2016    History of Present Illness 82 y.o. female, PMHx of prior TIA, Left BKA, PVD, CKD IV (she is not a dialysis patient), HTN, HLD, CAD, chronic diastolic heart failure, DM, with recent admission for acute CVA, discharged to rehabilitation, and now admitted for acute respiratory failure with hypoxia secondary to acute on chronic systolic CHF exacerbation    PT Comments    Found pt in bed incont urine.  Assisted to [facility] then to recliner via "Bear Hug" stand pivot sit.  Pt plans to D/C back home with Son 24/7 care as prior. Has all equipment  Follow Up Recommendations  Home health PT     Equipment Recommendations  None recommended by PT    Recommendations for Other Services       Precautions / Restrictions Precautions Precautions: Fall Precaution Comments: s/p L BKA (no prosthesis) Restrictions Weight Bearing Restrictions: No    Mobility  Bed Mobility Overal bed mobility: Needs Assistance Bed Mobility: Supine to Sit     Supine to sit: Max assist     General bed mobility comments: required increased assist as pt was found to be incont urine and "stuck" to wet bed pads.    Transfers Overall transfer level: Needs assistance Equipment used: None Transfers: Stand Pivot Transfers     Squat pivot transfers: Total assist     General transfer comment: "Bear Hug" stand pivot from bed to [facility] 1/4 turn towards her R.  Supporting upper body and blocking R LE, pt was able to stand briefly on her R LE put unable to pivot on own.    Ambulation/Gait                 Stairs            Wheelchair Mobility    Modified Rankin (Stroke Patients Only)       Balance                                    Cognition Arousal/Alertness: Awake/alert Behavior During Therapy: WFL for tasks assessed/performed Overall Cognitive Status: Within  Functional Limits for tasks assessed                      Exercises      General Comments        Pertinent Vitals/Pain Pain Assessment: Faces Faces Pain Scale: Hurts a little bit Pain Location: back side Pain Descriptors / Indicators: Grimacing Pain Intervention(s): Monitored during session    Home Living                      Prior Function            PT Goals (current goals can now be found in the care plan section) Progress towards PT goals: Progressing toward goals    Frequency    Min 3X/week      PT Plan Current plan remains appropriate    Co-evaluation             End of Session Equipment Utilized During Treatment: Gait belt Activity Tolerance: Patient limited by fatigue Patient left: in chair;with call bell/phone within reach     Time: 0626-9485 PT Time Calculation (min) (ACUTE ONLY): 26 min  Charges:  $Therapeutic Activity: 23-37 mins  G Codes:      Rica Koyanagi  PTA [facility]  Acute  Rehab Pager      [phone]

## 2016-01-20 NOTE — Care Management Important Message (Signed)
Important Message  Patient Details  Name: TYREESE KNIEP MRN: [id number] Date of Birth: May 13, 1923   Medicare Important Message Given:  Yes    Haskell Flirt 01/20/2016, 10:06 AMImportant Message  Patient Details  Name: ZANIYLAH BURUCA MRN: [id number] Date of Birth: 11/09/1923   Medicare Important Message Given:  Yes    Haskell Flirt 01/20/2016, 10:06 AM

## 2016-01-20 NOTE — Progress Notes (Addendum)
Son at bedside, states he will take pt to his home with no DME needs. [facility], referral given. PCP appointment 11/7 at 2:30 PM.

## 2016-03-01 ENCOUNTER — Encounter: Payer: Self-pay | Admitting: Podiatry

## 2016-03-01 ENCOUNTER — Ambulatory Visit (INDEPENDENT_AMBULATORY_CARE_PROVIDER_SITE_OTHER): Payer: Medicare Other | Admitting: Podiatry

## 2016-03-01 VITALS — BP 138/91 | HR 96 | Ht <= 58 in

## 2016-03-01 DIAGNOSIS — E1151 Type 2 diabetes mellitus with diabetic peripheral angiopathy without gangrene: Secondary | ICD-10-CM

## 2016-03-01 DIAGNOSIS — B351 Tinea unguium: Secondary | ICD-10-CM

## 2016-03-01 NOTE — Patient Instructions (Signed)

## 2016-03-01 NOTE — Progress Notes (Signed)
   Subjective:    Patient ID: [name], female    DOB: [birth year], 82 y.o.   MRN: [id number]  HPI   This patient presents  today with son in the treatment room. The patient's son is requesting debridement of his mother's toenails. The son describes gradual thickening and elongation of the toenails making it more difficult for the patients family to trim the toenails. There is no history of infection around the toenails  Patient is a diabetic with a history of BK amputation 2013     Review of Systems  All other systems reviewed and are negative.      Objective:   Physical Exam  Patient can respond is slow in responding and son will assist and answering questions  Vascular: DP and PT pulses 0/4 right Reflex delayed right  Neurological: Sensation to 10 g monofilament wire intact 3/5 right Vibratory sensation nonreactive right Ankle reflex weakly reactive right  Dermatological: No open skin lesions bilaterally Atrophic dry skin right The toenails are elongated, incurvated, discolored 1-5 right  Musculoskeletal: Patient was able to transfer from wheelchair to treatment chair Manual motor testing dorsi flexion, plantar flexion 5/5 right       Assessment & Plan:   Assessment: Diabetic with peripheral arterial disease and peripheral neuropathy 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

## 2016-04-15 ENCOUNTER — Ambulatory Visit: Payer: Medicare Other | Admitting: Internal Medicine

## 2016-04-18 ENCOUNTER — Encounter (HOSPITAL_COMMUNITY): Payer: Self-pay

## 2016-04-18 ENCOUNTER — Emergency Department (HOSPITAL_COMMUNITY): Payer: Medicare Other

## 2016-04-18 ENCOUNTER — Inpatient Hospital Stay (HOSPITAL_COMMUNITY)
Admission: EM | Admit: 2016-04-18 | Discharge: 2016-04-23 | DRG: 291 | Disposition: A | Discharge status: Home / Self Care | Payer: Medicare Other | Attending: Nephrology | Admitting: Nephrology

## 2016-04-18 DIAGNOSIS — Z66 Do not resuscitate: Secondary | ICD-10-CM | POA: Diagnosis present

## 2016-04-18 DIAGNOSIS — Z6821 Body mass index (BMI) 21.0-21.9, adult: Secondary | ICD-10-CM

## 2016-04-18 DIAGNOSIS — I509 Heart failure, unspecified: Secondary | ICD-10-CM | POA: Diagnosis not present

## 2016-04-18 DIAGNOSIS — F0390 Unspecified dementia without behavioral disturbance: Secondary | ICD-10-CM | POA: Diagnosis present

## 2016-04-18 DIAGNOSIS — I428 Other cardiomyopathies: Secondary | ICD-10-CM | POA: Diagnosis present

## 2016-04-18 DIAGNOSIS — I5043 Acute on chronic combined systolic (congestive) and diastolic (congestive) heart failure: Secondary | ICD-10-CM | POA: Diagnosis present

## 2016-04-18 DIAGNOSIS — Z89612 Acquired absence of left leg above knee: Secondary | ICD-10-CM

## 2016-04-18 DIAGNOSIS — E1122 Type 2 diabetes mellitus with diabetic chronic kidney disease: Secondary | ICD-10-CM | POA: Diagnosis present

## 2016-04-18 DIAGNOSIS — E1151 Type 2 diabetes mellitus with diabetic peripheral angiopathy without gangrene: Secondary | ICD-10-CM | POA: Diagnosis present

## 2016-04-18 DIAGNOSIS — I739 Peripheral vascular disease, unspecified: Secondary | ICD-10-CM | POA: Diagnosis present

## 2016-04-18 DIAGNOSIS — E039 Hypothyroidism, unspecified: Secondary | ICD-10-CM | POA: Diagnosis not present

## 2016-04-18 DIAGNOSIS — E669 Obesity, unspecified: Secondary | ICD-10-CM | POA: Diagnosis present

## 2016-04-18 DIAGNOSIS — D649 Anemia, unspecified: Secondary | ICD-10-CM | POA: Diagnosis present

## 2016-04-18 DIAGNOSIS — I519 Heart disease, unspecified: Secondary | ICD-10-CM

## 2016-04-18 DIAGNOSIS — N179 Acute kidney failure, unspecified: Secondary | ICD-10-CM | POA: Diagnosis present

## 2016-04-18 DIAGNOSIS — E038 Other specified hypothyroidism: Secondary | ICD-10-CM | POA: Diagnosis not present

## 2016-04-18 DIAGNOSIS — I5023 Acute on chronic systolic (congestive) heart failure: Secondary | ICD-10-CM

## 2016-04-18 DIAGNOSIS — R0600 Dyspnea, unspecified: Secondary | ICD-10-CM | POA: Diagnosis not present

## 2016-04-18 DIAGNOSIS — I110 Hypertensive heart disease with heart failure: Secondary | ICD-10-CM | POA: Diagnosis present

## 2016-04-18 DIAGNOSIS — Z886 Allergy status to analgesic agent status: Secondary | ICD-10-CM

## 2016-04-18 DIAGNOSIS — K219 Gastro-esophageal reflux disease without esophagitis: Secondary | ICD-10-CM | POA: Diagnosis present

## 2016-04-18 DIAGNOSIS — R0603 Acute respiratory distress: Secondary | ICD-10-CM | POA: Diagnosis present

## 2016-04-18 DIAGNOSIS — Z87891 Personal history of nicotine dependence: Secondary | ICD-10-CM

## 2016-04-18 DIAGNOSIS — Z79899 Other long term (current) drug therapy: Secondary | ICD-10-CM

## 2016-04-18 DIAGNOSIS — E1142 Type 2 diabetes mellitus with diabetic polyneuropathy: Secondary | ICD-10-CM | POA: Diagnosis present

## 2016-04-18 DIAGNOSIS — Z8249 Family history of ischemic heart disease and other diseases of the circulatory system: Secondary | ICD-10-CM

## 2016-04-18 DIAGNOSIS — Z7982 Long term (current) use of aspirin: Secondary | ICD-10-CM

## 2016-04-18 DIAGNOSIS — N184 Chronic kidney disease, stage 4 (severe): Secondary | ICD-10-CM | POA: Diagnosis present

## 2016-04-18 DIAGNOSIS — Z515 Encounter for palliative care: Secondary | ICD-10-CM | POA: Diagnosis not present

## 2016-04-18 DIAGNOSIS — T501X6A Underdosing of loop [high-ceiling] diuretics, initial encounter: Secondary | ICD-10-CM | POA: Diagnosis present

## 2016-04-18 DIAGNOSIS — E785 Hyperlipidemia, unspecified: Secondary | ICD-10-CM | POA: Diagnosis present

## 2016-04-18 DIAGNOSIS — M109 Gout, unspecified: Secondary | ICD-10-CM | POA: Diagnosis present

## 2016-04-18 DIAGNOSIS — I2510 Atherosclerotic heart disease of native coronary artery without angina pectoris: Secondary | ICD-10-CM | POA: Diagnosis present

## 2016-04-18 DIAGNOSIS — R0602 Shortness of breath: Secondary | ICD-10-CM | POA: Diagnosis not present

## 2016-04-18 DIAGNOSIS — E876 Hypokalemia: Secondary | ICD-10-CM | POA: Diagnosis not present

## 2016-04-18 DIAGNOSIS — I130 Hypertensive heart and chronic kidney disease with heart failure and stage 1 through stage 4 chronic kidney disease, or unspecified chronic kidney disease: Secondary | ICD-10-CM | POA: Diagnosis present

## 2016-04-18 DIAGNOSIS — Z89512 Acquired absence of left leg below knee: Secondary | ICD-10-CM

## 2016-04-18 DIAGNOSIS — I10 Essential (primary) hypertension: Secondary | ICD-10-CM | POA: Diagnosis not present

## 2016-04-18 DIAGNOSIS — I252 Old myocardial infarction: Secondary | ICD-10-CM

## 2016-04-18 DIAGNOSIS — B370 Candidal stomatitis: Secondary | ICD-10-CM | POA: Diagnosis not present

## 2016-04-18 DIAGNOSIS — N2581 Secondary hyperparathyroidism of renal origin: Secondary | ICD-10-CM | POA: Diagnosis present

## 2016-04-18 DIAGNOSIS — Z8673 Personal history of transient ischemic attack (TIA), and cerebral infarction without residual deficits: Secondary | ICD-10-CM | POA: Diagnosis not present

## 2016-04-18 DIAGNOSIS — Z7189 Other specified counseling: Secondary | ICD-10-CM | POA: Diagnosis not present

## 2016-04-18 LAB — TSH: TSH: 261.867 u[IU]/mL — ABNORMAL HIGH (ref 0.350–4.500)

## 2016-04-18 LAB — COMPREHENSIVE METABOLIC PANEL
ALBUMIN: 3.7 g/dL (ref 3.5–5.0)
ALK PHOS: 74 U/L (ref 38–126)
ALT: 21 U/L (ref 14–54)
AST: 47 U/L — AB (ref 15–41)
Anion gap: 11 (ref 5–15)
BUN: 19 mg/dL (ref 6–20)
CHLORIDE: 109 mmol/L (ref 101–111)
CO2: 22 mmol/L (ref 22–32)
CREATININE: 2.16 mg/dL — AB (ref 0.44–1.00)
Calcium: 9.6 mg/dL (ref 8.9–10.3)
GFR calc non Af Amer: 19 mL/min — ABNORMAL LOW (ref >60)
GFR, EST AFRICAN AMERICAN: 22 mL/min — AB (ref >60)
GLUCOSE: 165 mg/dL — AB (ref 65–99)
Potassium: 5.1 mmol/L (ref 3.5–5.1)
SODIUM: 142 mmol/L (ref 135–145)
Total Bilirubin: 1.8 mg/dL — ABNORMAL HIGH (ref 0.3–1.2)
Total Protein: 7.2 g/dL (ref 6.5–8.1)

## 2016-04-18 LAB — CBC WITH DIFFERENTIAL/PLATELET
BASOS ABS: 0.1 10*3/uL (ref 0.0–0.1)
Basophils Relative: 1 %
EOS ABS: 0.2 10*3/uL (ref 0.0–0.7)
Eosinophils Relative: 3 %
HCT: 36.2 % (ref 36.0–46.0)
Hemoglobin: 11.6 g/dL — ABNORMAL LOW (ref 12.0–15.0)
Lymphocytes Relative: 31 %
Lymphs Abs: 2.1 10*3/uL (ref 0.7–4.0)
MCH: 28.1 pg (ref 26.0–34.0)
MCHC: 32 g/dL (ref 30.0–36.0)
MCV: 87.7 fL (ref 78.0–100.0)
MONO ABS: 0.6 10*3/uL (ref 0.1–1.0)
Monocytes Relative: 9 %
Neutro Abs: 3.8 10*3/uL (ref 1.7–7.7)
Neutrophils Relative %: 56 %
Platelets: 343 10*3/uL (ref 150–400)
RBC: 4.13 MIL/uL (ref 3.87–5.11)
RDW: 15.8 % — AB (ref 11.5–15.5)
WBC: 6.8 10*3/uL (ref 4.0–10.5)

## 2016-04-18 LAB — I-STAT TROPONIN, ED: TROPONIN I, POC: 0.09 ng/mL — AB (ref 0.00–0.08)

## 2016-04-18 LAB — TROPONIN I: TROPONIN I: 0.07 ng/mL — AB (ref <0.03)

## 2016-04-18 LAB — BRAIN NATRIURETIC PEPTIDE: B Natriuretic Peptide: 2284.3 pg/mL — ABNORMAL HIGH (ref 0.0–100.0)

## 2016-04-18 LAB — MRSA PCR SCREENING: MRSA by PCR: NEGATIVE

## 2016-04-18 MED ORDER — IPRATROPIUM-ALBUTEROL 0.5-2.5 (3) MG/3ML IN SOLN
3.0000 mL | Freq: Four times a day (QID) | RESPIRATORY_TRACT | Status: DC
  Administered 2016-04-18: 3 mL via RESPIRATORY_TRACT
  Filled 2016-04-18 (×2): qty 3

## 2016-04-18 MED ORDER — SODIUM CHLORIDE 0.9% FLUSH: 3.0000 mL | INTRAVENOUS | Status: DC | PRN

## 2016-04-18 MED ORDER — ASPIRIN 325 MG PO TABS: 325.0000 mg | ORAL_TABLET | Freq: Every day | ORAL | Status: DC

## 2016-04-18 MED ORDER — FAMOTIDINE 20 MG PO TABS
20.0000 mg | ORAL_TABLET | Freq: Every day | ORAL | Status: DC
  Administered 2016-04-18 – 2016-04-22 (×5): 20 mg via ORAL
  Filled 2016-04-18 (×5): qty 1

## 2016-04-18 MED ORDER — ALBUTEROL SULFATE (2.5 MG/3ML) 0.083% IN NEBU: 2.5000 mg | INHALATION_SOLUTION | RESPIRATORY_TRACT | Status: DC | PRN

## 2016-04-18 MED ORDER — VITAMIN D (ERGOCALCIFEROL) 1.25 MG (50000 UNIT) PO CAPS
50000.0000 [IU] | ORAL_CAPSULE | ORAL | Status: DC
  Administered 2016-04-20 – 2016-04-22 (×2): 50000 [IU] via ORAL
  Filled 2016-04-18: qty 1

## 2016-04-18 MED ORDER — DOCUSATE SODIUM 100 MG PO CAPS: 100.0000 mg | ORAL_CAPSULE | Freq: Every day | ORAL | Status: DC | PRN

## 2016-04-18 MED ORDER — FUROSEMIDE 10 MG/ML IJ SOLN
60.0000 mg | Freq: Once | INTRAMUSCULAR | Status: AC
  Administered 2016-04-18: 60 mg via INTRAVENOUS
  Filled 2016-04-18: qty 6

## 2016-04-18 MED ORDER — FUROSEMIDE 10 MG/ML IJ SOLN
60.0000 mg | Freq: Four times a day (QID) | INTRAMUSCULAR | Status: DC
  Administered 2016-04-18 – 2016-04-20 (×6): 60 mg via INTRAVENOUS
  Filled 2016-04-18 (×6): qty 6

## 2016-04-18 MED ORDER — PRAVASTATIN SODIUM 40 MG PO TABS
40.0000 mg | ORAL_TABLET | Freq: Every evening | ORAL | Status: DC
  Administered 2016-04-19 – 2016-04-22 (×4): 40 mg via ORAL
  Filled 2016-04-18 (×4): qty 1

## 2016-04-18 MED ORDER — ASPIRIN EC 325 MG PO TBEC
325.0000 mg | DELAYED_RELEASE_TABLET | Freq: Every day | ORAL | Status: DC
  Administered 2016-04-19 – 2016-04-21 (×3): 325 mg via ORAL
  Filled 2016-04-18 (×3): qty 1

## 2016-04-18 MED ORDER — SEVELAMER CARBONATE 800 MG PO TABS
800.0000 mg | ORAL_TABLET | Freq: Three times a day (TID) | ORAL | Status: DC
  Administered 2016-04-19 – 2016-04-23 (×12): 800 mg via ORAL
  Filled 2016-04-18 (×11): qty 1

## 2016-04-18 MED ORDER — SODIUM CHLORIDE 0.9% FLUSH
3.0000 mL | Freq: Two times a day (BID) | INTRAVENOUS | Status: DC
  Administered 2016-04-18 – 2016-04-22 (×9): 3 mL via INTRAVENOUS

## 2016-04-18 MED ORDER — IPRATROPIUM-ALBUTEROL 0.5-2.5 (3) MG/3ML IN SOLN
3.0000 mL | Freq: Three times a day (TID) | RESPIRATORY_TRACT | Status: DC
  Administered 2016-04-19: 3 mL via RESPIRATORY_TRACT
  Filled 2016-04-18: qty 3

## 2016-04-18 MED ORDER — NITROGLYCERIN 0.4 MG SL SUBL
0.4000 mg | SUBLINGUAL_TABLET | SUBLINGUAL | Status: DC | PRN
  Administered 2016-04-18 – 2016-04-19 (×2): 0.4 mg via SUBLINGUAL
  Filled 2016-04-18 (×2): qty 1

## 2016-04-18 MED ORDER — BOOST / RESOURCE BREEZE PO LIQD
1.0000 | Freq: Three times a day (TID) | ORAL | Status: DC
  Administered 2016-04-19 – 2016-04-23 (×8): 1 via ORAL

## 2016-04-18 MED ORDER — ALLOPURINOL 100 MG PO TABS
200.0000 mg | ORAL_TABLET | Freq: Every day | ORAL | Status: DC
  Administered 2016-04-19 – 2016-04-23 (×5): 200 mg via ORAL
  Filled 2016-04-18 (×5): qty 2

## 2016-04-18 MED ORDER — ENOXAPARIN SODIUM 30 MG/0.3ML ~~LOC~~ SOLN
30.0000 mg | SUBCUTANEOUS | Status: DC
  Administered 2016-04-18 – 2016-04-22 (×5): 30 mg via SUBCUTANEOUS
  Filled 2016-04-18 (×6): qty 0.3

## 2016-04-18 MED ORDER — ISOSORBIDE MONONITRATE ER 60 MG PO TB24
60.0000 mg | ORAL_TABLET | Freq: Every day | ORAL | Status: DC
  Administered 2016-04-19 – 2016-04-23 (×5): 60 mg via ORAL
  Filled 2016-04-18 (×2): qty 2
  Filled 2016-04-18 (×2): qty 1
  Filled 2016-04-18: qty 2

## 2016-04-18 MED ORDER — CALCITRIOL 0.25 MCG PO CAPS
0.2500 ug | ORAL_CAPSULE | ORAL | Status: DC
  Administered 2016-04-19 – 2016-04-23 (×3): 0.25 ug via ORAL
  Filled 2016-04-18 (×5): qty 1

## 2016-04-18 MED ORDER — HYDRALAZINE HCL 50 MG PO TABS
50.0000 mg | ORAL_TABLET | Freq: Two times a day (BID) | ORAL | Status: DC
  Administered 2016-04-18 – 2016-04-23 (×10): 50 mg via ORAL
  Filled 2016-04-18 (×2): qty 1
  Filled 2016-04-18 (×2): qty 2
  Filled 2016-04-18: qty 1
  Filled 2016-04-18 (×5): qty 2

## 2016-04-18 MED ORDER — ACETAMINOPHEN 325 MG PO TABS: 650.0000 mg | ORAL_TABLET | ORAL | Status: DC | PRN

## 2016-04-18 MED ORDER — METOPROLOL TARTRATE 12.5 MG HALF TABLET
12.5000 mg | ORAL_TABLET | Freq: Two times a day (BID) | ORAL | Status: DC
  Administered 2016-04-18 – 2016-04-23 (×10): 12.5 mg via ORAL
  Filled 2016-04-18 (×10): qty 1

## 2016-04-18 MED ORDER — SODIUM CHLORIDE 0.9 % IV SOLN: 250.0000 mL | INTRAVENOUS | Status: DC | PRN

## 2016-04-18 MED ORDER — ACETAMINOPHEN 325 MG PO TABS: 650.0000 mg | ORAL_TABLET | Freq: Four times a day (QID) | ORAL | Status: DC | PRN

## 2016-04-18 MED ORDER — ONDANSETRON HCL 4 MG/2ML IJ SOLN: 4.0000 mg | Freq: Four times a day (QID) | INTRAMUSCULAR | Status: DC | PRN

## 2016-04-18 NOTE — Progress Notes (Addendum)
Received critical lab, Troponin 0.07. Lab value reported to pts RN Jasmine December.

## 2016-04-18 NOTE — ED Provider Notes (Signed)
[facility]-EMERGENCY DEPT Provider Note   CSN: [id number] Arrival date & time: 04/18/16  1600     History   Chief Complaint Chief Complaint  Patient presents with  . Congestive Heart Failure    HPI [name] is a 83 y.o. female.  HPI   Presents with concern for shortness of breath and cough beginning today. Family reports that she missed a dose of Lasix yesterday, and when they tried to give her her meds this morning, she vomited them. The shortness of breath woke her from sleep early this morning and continued and worsened throughout the day. Reports its worse laying down. Reports she's also had a nonproductive cough. Denies fevers, chills, body aches, sore throat or congestion. Has not noticed any swelling in her leg. Denies any chest pain.  Past Medical History:  Diagnosis Date  . Abnormal nuclear stress test 06/01/09   Demonstrated a new area of infarct scar, peri-infarct ischemia seen in the inferolateral territory. EF eas normal at 70% with mild hypocontractility at the apex, distal inferolateral wall.  . Anemia   . Arthritis   . Cardiomyopathy, idiopathic (HCC) 02/08/2011  . Chronic diastolic CHF (congestive heart failure) (HCC)    Takes Lasix  . Chronic kidney disease (CKD), stage IV (severe) (HCC)   . Coronary artery disease    a. Cath 09/2010 - med rx.  . Diabetes mellitus    type 2 NIDDM  . Dyslipidemia   . GERD (gastroesophageal reflux disease)   . Gout    takes allopurinol  . H/O echocardiogram 09/06/11   Indication- nonIschemic Cardiomyopathy. EF = now greater than 55% with no regional wall motion abnormalities. Tthere is mild to moderate trisuspid regurgitayion and mild pulmonary hypertension with an RVSP of 35 mmHg as well as stage 1 diastolic dysfunction and mild to moderate LVH.  Marland Kitchen Hx of transient ischemic attack (TIA)   . Hypertension   . Hypothyroidism    (SEVERE) Takes Levothryroxine  . Irregular heartbeat   . Memory loss   . Myocardial infarct      x 3 unsure of years  . Nonischemic cardiomyopathy (HCC)    EF now is 55%, reduced due to myxedema, which is improved.  . Peripheral neuropathy (HCC)   . Peripheral vascular disease (HCC)    a. s/p L BKA.  . Shingles   . Stroke (HCC)   . TIA (transient ischemic attack)   . [facility])     Patient Active Problem List   Diagnosis Date Noted  . S/P AKA (above knee amputation) unilateral, left (HCC)   . CHF (congestive heart failure) (HCC) 04/18/2016  . Respiratory distress   . Acute on chronic systolic CHF (congestive heart failure) (HCC) 01/16/2016  . Palliative care encounter   . Altered mental status   . Goals of care, counseling/discussion   . Palliative care by specialist   . Aphasia following other cerebrovascular disease   . Hemi-neglect of right side   . Cerebral thrombosis with cerebral infarction 12/30/2015  . Cerebrovascular accident (CVA) involving left middle cerebral artery territory [facility])   . Altered mental state 12/29/2015  . Acute right-sided weakness 12/29/2015  . Dysphagia   . FTT (failure to thrive) in adult 11/08/2015  . Nausea & vomiting 11/08/2015  . Metabolic acidosis 11/08/2015  . Hypertensive heart disease with heart failure (HCC) 10/07/2015  . Junctional bradycardia 09/18/2015  . Atypical chest pain 06/23/2014  . Neck pain   . Food impaction of esophagus   .  Epiglottitis 04/13/2014  . Wound disruption, post-op, skin 02/05/2014  . Pre-operative cardiovascular examination 12/13/2013  . Foot pain 09/04/2013  . CKD (chronic kidney disease) stage 4, GFR 15-29 ml/min (HCC) 09/04/2013  . Essential hypertension 08/23/2013  . Malnutrition of moderate degree (HCC) 08/21/2013  . Chronic diastolic heart failure (HCC) 08/15/2013  . Anemia of chronic disease 08/15/2013  . Hyperkalemia 08/15/2013  . 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 Dyslipidemia 01/05/2013  . Chronic renal insufficiency, stage IV (severe)- HD started  01/05/13 01/05/2013  . Diabetes mellitus type 2 with peripheral artery disease (HCC) 01/05/2013  . Peripheral vascular disease (HCC) 10/19/2011  . CAD - moderate at cath July 2012 (medical Rx) 02/08/2011  . 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 Severe hypothyroidism 02/08/2011    Past Surgical History:  Procedure Laterality Date  . AMPUTATION  07/05/2011   Procedure: AMPUTATION BELOW KNEE;  Surgeon: Chuck Hint, MD;  Location: [facility] OR;  Service: Vascular;  Laterality: Left;  . ANGIOPLASTY  1988  . AV FISTULA PLACEMENT    . AV FISTULA PLACEMENT Right 12/24/2013   Procedure: INSERTION OF ARTERIOVENOUS (AV) GORE-TEX GRAFT ARM;  Surgeon: Chuck Hint, MD;  Location: [facility] OR;  Service: Vascular;  Laterality: Right;  . BACK SURGERY     [facility]  . CARDIAC CATHETERIZATION  09/28/07   Demonstrated multiple sequential lesions around 40 to 30% in the RCA territory.  . Duplex doppler  05/10/11   LE arterial dopplers demonstrate bilaterally reduced ABIs of 0.91 on right & 0.56 on left. She does report some decreased pain on the left, & there's moderate mixed-density plaque in the right SFA w/50 to 69% reduction. There's a 69% reduction in the left SFA & does appear to be occlusive disease of left posterior tibial artery. Right posterior dorsalis pedis artery demonstrates occlusive disease  . ESOPHAGOGASTRODUODENOSCOPY N/A 04/15/2014   Procedure: ESOPHAGOGASTRODUODENOSCOPY (EGD);  Surgeon: Rachael Fee, MD;  Location: [facility] ENDOSCOPY;  Service: Endoscopy;  Laterality: N/A;  . EYE SURGERY     Left eye surgery; cataract removal  . INSERTION OF DIALYSIS CATHETER N/A 01/06/2013   Procedure: INSERTION OF DIALYSIS CATHETER;  Surgeon: Larina Earthly, MD;  Location: [facility] OR;  Service: Vascular;  Laterality: N/A;  . left bka  07/05/2011  . left lower extremity venous duplex Left 06/27/11   Summary: No evidence of DVT involving the left lower extremity and right  common femoral vein.   Marland Kitchen LIGATION ARTERIOVENOUS GORTEX GRAFT Right 03/25/2014   Procedure: LIGATION ARTERIOVENOUS GORTEX GRAFT;  Surgeon: Chuck Hint, MD;  Location[facility] OR;  Service: Vascular;  Laterality: Right;  . LOWER EXTREMITY ANGIOGRAM N/A 10/31/2011   Procedure: LOWER EXTREMITY ANGIOGRAM;  Surgeon: Chuck Hint, MD;  Location: [facility] CATH LAB;  Service: Cardiovascular;  Laterality: N/A;  . Lower extremity arterial evaluation  06/27/11   SUMMARY: Right: ABI not ascertained due to false elevation in BP secondary to calcification (posterior tibial artery is non compressible). Left: ABI indicates moderate reduction in arterial flow. Bilateral: Great toe PPG waveforms indicate adequate perfusion. Great toe pressures not obtained due to patient's movements secondary to pain.  Marland Kitchen SHUNTOGRAM N/A 03/17/2014   Procedure: Betsey Amen;  Surgeon: Fransisco Hertz, MD;  Location: [facility] CATH LAB;  Service: Cardiovascular;  Laterality: N/A;    OB History    No data available       Home Medications    Prior to Admission  medications   Medication Sig Start Date End Date Taking? Authorizing Provider  acetaminophen (TYLENOL) 325 MG tablet Take 650 mg by mouth every 6 (six) hours as needed for mild pain.   Yes Historical Provider, MD  albuterol-ipratropium (COMBIVENT) 18-103 MCG/ACT inhaler Inhale 1-2 puffs into the lungs at bedtime as needed for wheezing or shortness of breath.    Yes Historical Provider, MD  allopurinol (ZYLOPRIM) 100 MG tablet Take 100 mg by mouth daily.    Yes Historical Provider, MD  aspirin 325 MG tablet Take 1 tablet (325 mg total) by mouth daily. 01/04/16  Yes Alison Murray, MD  calcitRIOL (ROCALTROL) 0.25 MCG capsule Take 0.25 mcg by mouth every other day.  11/04/12  Yes Historical Provider, MD  Calcium Carbonate-Vitamin D (CALCIUM-VITAMIN D) 500-200 MG-UNIT tablet Take 1 tablet by mouth daily.   Yes Historical Provider, MD  Cholecalciferol (VITAMIN D PO) Take 1 capsule by mouth  daily.   Yes Historical Provider, MD  docusate sodium (COLACE) 100 MG capsule Take 100 mg by mouth daily as needed for mild constipation.   Yes Historical Provider, MD  famotidine (PEPCID) 20 MG tablet Take 1 tablet (20 mg total) by mouth at bedtime. Patient taking differently: Take 20 mg by mouth at bedtime as needed for heartburn.  11/13/15  Yes Rodolph Bong, MD  furosemide (LASIX) 20 MG tablet Take 1 tablet (20 mg total) by mouth daily. Patient taking differently: Take 80 mg by mouth daily.  01/20/16  Yes Filbert Schilder, MD  gabapentin (NEURONTIN) 300 MG capsule Take 300 mg by mouth 3 (three) times daily.    Yes Historical Provider, MD  hydrALAZINE (APRESOLINE) 50 MG tablet Take 1 tablet (50 mg total) by mouth 2 (two) times daily. Patient taking differently: Take 50 mg by mouth 3 (three) times daily.  10/22/15  Yes Chrystie Nose, MD  isosorbide mononitrate (IMDUR) 60 MG 24 hr tablet Take 60 mg by mouth daily.   Yes Historical Provider, MD  levothyroxine (SYNTHROID, LEVOTHROID) 100 MCG tablet Take 100 mcg by mouth daily. 04/09/16  Yes Historical Provider, MD  metoCLOPramide (REGLAN) 5 MG tablet Take 1 tablet (5 mg total) by mouth every 6 (six) hours as needed for nausea. 01/04/16  Yes Alison Murray, MD  pravastatin (PRAVACHOL) 40 MG tablet Take 40 mg by mouth every evening.    Yes Historical Provider, MD  RENVELA 800 MG tablet Take 800 mg by mouth 3 (three) times daily with meals.  07/04/13  Yes Historical Provider, MD  feeding supplement (BOOST / RESOURCE BREEZE) LIQD Take 1 Container by mouth 3 (three) times daily between meals. Patient not taking: Reported on 04/19/2016 11/13/15   Rodolph Bong, MD  sodium bicarbonate 650 MG tablet Take 1 tablet (650 mg total) by mouth daily. Patient not taking: Reported on 04/19/2016 11/13/15   Rodolph Bong, MD    Family History Family History  Problem Relation Age of Onset  . Heart attack Daughter   . Heart disease Daughter     Before age  60  . Cancer Sister     STOMACH  . Diabetes Sister   . Cancer Brother     BONE  . Diabetes Brother   . Hyperlipidemia Daughter   . Hypertension Daughter   . Heart disease Daughter     before age 42  . Kidney disease Daughter   . Other Daughter     varicose veins  . Diabetes Daughter   . Heart disease  Son     before age 49  . Hyperlipidemia Son   . Hypertension Son   . Heart attack Son   . Anesthesia problems Neg Hx   . Hypotension Neg Hx   . Malignant hyperthermia Neg Hx   . Pseudochol deficiency Neg Hx     Social History Social History  Substance Use Topics  . Smoking status: Former Smoker    Packs/day: 0.50    Years: 40.00    Quit date: 07/05/1986  . Smokeless tobacco: Former Neurosurgeon     Comment: quit smoking 1988  . Alcohol use No     Allergies   Aspirin   Review of Systems Review of Systems  Constitutional: Positive for fatigue. Negative for fever.  HENT: Negative for sore throat.   Eyes: Negative for visual disturbance.  Respiratory: Positive for cough and shortness of breath.   Cardiovascular: Negative for chest pain.  Gastrointestinal: Positive for vomiting (one episode, thought maybe from taste of meds). Negative for abdominal pain and nausea.  Genitourinary: Negative for difficulty urinating.  Musculoskeletal: Negative for back pain and neck pain.  Skin: Negative for rash.  Neurological: Negative for syncope and headaches.     Physical Exam Updated Vital Signs BP (!) 154/80 (BP Location: Right Arm)   Pulse 81   Temp 97.7 F (36.5 C) (Oral)   Resp (!) 26   Ht 5' (1.524 m)   Wt 112 lb 3.4 oz (50.9 kg)   SpO2 96%   BMI 21.92 kg/m   Physical Exam  Constitutional: She is oriented to person, place, and time. She appears well-developed and well-nourished. No distress.  HENT:  Head: Normocephalic and atraumatic.  Eyes: Conjunctivae and EOM are normal.  Neck: Normal range of motion. JVD present.  Cardiovascular: Normal rate, regular rhythm,  normal heart sounds and intact distal pulses.  Exam reveals no gallop and no friction rub.   No murmur heard. Pulmonary/Chest: Effort normal. Tachypnea noted. She has no wheezes. She has rales (course rales bilaterally).  Abdominal: Soft. She exhibits no distension. There is no tenderness. There is no guarding.  Musculoskeletal: She exhibits no edema or tenderness.  Neurological: She is alert and oriented to person, place, and time.  Skin: Skin is warm and dry. No rash noted. She is not diaphoretic. No erythema.  Nursing note and vitals reviewed.    ED Treatments / Results  Labs (all labs ordered are listed, but only abnormal results are displayed) Labs Reviewed  CBC WITH DIFFERENTIAL/PLATELET - Abnormal; Notable for the following:       Result Value   Hemoglobin 11.6 (*)    RDW 15.8 (*)    All other components within normal limits  COMPREHENSIVE METABOLIC PANEL - Abnormal; Notable for the following:    Glucose, Bld 165 (*)    Creatinine, Ser 2.16 (*)    AST 47 (*)    Total Bilirubin 1.8 (*)    GFR calc non Af Amer 19 (*)    GFR calc Af Amer 22 (*)    All other components within normal limits  BRAIN NATRIURETIC PEPTIDE - Abnormal; Notable for the following:    B Natriuretic Peptide 2,284.3 (*)    All other components within normal limits  TSH - Abnormal; Notable for the following:    TSH 261.867 (*)    All other components within normal limits  BASIC METABOLIC PANEL - Abnormal; Notable for the following:    Glucose, Bld 153 (*)    Creatinine, Ser 2.00 (*)  GFR calc non Af Amer 21 (*)    GFR calc Af Amer 24 (*)    All other components within normal limits  TROPONIN I - Abnormal; Notable for the following:    Troponin I 0.07 (*)    All other components within normal limits  TROPONIN I - Abnormal; Notable for the following:    Troponin I 0.09 (*)    All other components within normal limits  T4, FREE - Abnormal; Notable for the following:    Free T4 0.41 (*)    All  other components within normal limits  I-STAT TROPOININ, ED - Abnormal; Notable for the following:    Troponin i, poc 0.09 (*)    All other components within normal limits  MRSA PCR SCREENING  T3, FREE    EKG  EKG Interpretation  Date/Time:  Monday April 18 2016 16:13:57 EST Ventricular Rate:  106 PR Interval:    QRS Duration: 84 QT Interval:  375 QTC Calculation: 498 R Axis:   5 Text Interpretation:  Sinus tachycardia Probable left atrial enlargement LVH with secondary repolarization abnormality Minimal ST elevation, inferior leads Borderline prolonged QT interval Artifact ST changes lateral leads similar to prior No significant change since last tracing Confirmed by [facility] MD, Oleg Oleson ([id number]) on 04/18/2016 5:57:09 PM       Radiology Dg Chest Portable 1 View  Result Date: 04/18/2016 CLINICAL DATA:  Short breath EXAM: PORTABLE CHEST 1 VIEW COMPARISON:  None. FINDINGS: Stable enlarged cardiac silhouette. There is lower lobe mild airspace disease similar to prior. Small effusions. Upper lungs are clear. IMPRESSION: Cardiomegaly and lower lobe airspace disease suggests congestive heart failure. Electronically Signed   By: Genevive Bi M.D.   On: 04/18/2016 16:52    Procedures Procedures (including critical care time)  Medications Ordered in ED Medications  nitroGLYCERIN (NITROSTAT) SL tablet 0.4 mg (0.4 mg Sublingual Given 04/19/16 0109)  metoprolol tartrate (LOPRESSOR) tablet 12.5 mg (12.5 mg Oral Given 04/19/16 0937)  Vitamin D (Ergocalciferol) (DRISDOL) capsule 50,000 Units (not administered)  famotidine (PEPCID) tablet 20 mg (20 mg Oral Given 04/18/16 2319)  feeding supplement (BOOST / RESOURCE BREEZE) liquid 1 Container (1 Container Oral Not Given 04/19/16 1400)  hydrALAZINE (APRESOLINE) tablet 50 mg (50 mg Oral Given 04/19/16 0937)  docusate sodium (COLACE) capsule 100 mg (not administered)  isosorbide mononitrate (IMDUR) 24 hr tablet 60 mg (60 mg Oral Given 04/19/16 0936)   sevelamer carbonate (RENVELA) tablet 800 mg (800 mg Oral Given 04/19/16 1623)  calcitRIOL (ROCALTROL) capsule 0.25 mcg (0.25 mcg Oral Given 04/19/16 0936)  pravastatin (PRAVACHOL) tablet 40 mg (40 mg Oral Given 04/19/16 1623)  allopurinol (ZYLOPRIM) tablet 200 mg (200 mg Oral Given 04/19/16 0937)  sodium chloride flush (NS) 0.9 % injection 3 mL (3 mLs Intravenous Given 04/19/16 0939)  sodium chloride flush (NS) 0.9 % injection 3 mL (not administered)  0.9 %  sodium chloride infusion (not administered)  ondansetron (ZOFRAN) injection 4 mg (not administered)  enoxaparin (LOVENOX) injection 30 mg (30 mg Subcutaneous Given 04/18/16 2317)  furosemide (LASIX) injection 60 mg (60 mg Intravenous Given 04/19/16 1623)  acetaminophen (TYLENOL) tablet 650 mg (not administered)  albuterol (PROVENTIL) (2.5 MG/3ML) 0.083% nebulizer solution 2.5 mg (not administered)  aspirin EC tablet 325 mg (325 mg Oral Given 04/19/16 0938)  ipratropium-albuterol (DUONEB) 0.5-2.5 (3) MG/3ML nebulizer solution 3 mL (not administered)  levothyroxine (SYNTHROID, LEVOTHROID) tablet 100 mcg (100 mcg Oral Given 04/19/16 1623)  furosemide (LASIX) injection 60 mg (60 mg Intravenous Given 04/18/16  1729)     Initial Impression / Assessment and Plan / ED Course  I have reviewed the triage vital signs and the nursing notes.  Pertinent labs & imaging results that were available during my care of the patient were reviewed by me and considered in my medical decision making (see chart for details).    83 year old female with a history of coronary artery disease, chronic kidney disease stage IV, diabetes, hypertension, CVA, congestive heart failure with recent ejection fraction of 30-35% who presents with concern for shortness of breath beginning today.  Clinical history, exam, chest x-ray and laboratory work consistent with congestive heart failure exacerbation. Patient was even 60 mg of IV Lasix, nitroglycerin glycerin sublingual, and  aspirin. Her troponin is elevated, likely secondary to congestive heart failure exacerbation, however we'll continue to monitor. Pt admitted for further care.     Final Clinical Impressions(s) / ED Diagnoses   Final diagnoses:  Acute on chronic systolic congestive heart failure [facility])    New Prescriptions Current Discharge Medication List       Alvira Monday, MD 04/19/16 1630

## 2016-04-18 NOTE — H&P (Signed)
History and Physical    [name] QIW:[id number] DOB: [birth year] DOA: 04/18/2016  PCP: Georgianne Fick, MD  Patient coming from:Home  Chief Complaint: shortness of breath  HPI: [name] is a 83 y.o. female with medical history significant of hypertension, chronic kidney disease stage IV, congestive heart failure with EF of 30%-35 % , peripheral vascular disease status post left BKA, prior stroke presented from home with worsening shortness of breath, dyspnea and exception worsening for 2 days. Patient lives with her son who gives most of the medication. Reportedly patient has been vomiting medications recently. Since yesterday patient was noticed to have labored breathing associated with wheezing and lower extremity edema. Patient denied fever, chills, headache, dizziness, chest pain, cough, runny nose, vomiting, abdominal pain. Denied urinary or bowel moment change. 2. Patient is mostly bedbound and wheelchair bound. Her son helps for muscle activities. ED Course: In the ER patient was found to have respiratory distress, congestive heart failure, treated with Lasix and admitted for further evaluation.  Review of Systems: As per HPI otherwise 10 point review of systems negative.    Past Medical History:  Diagnosis Date  . Abnormal nuclear stress test 06/01/09   Demonstrated a new area of infarct scar, peri-infarct ischemia seen in the inferolateral territory. EF eas normal at 70% with mild hypocontractility at the apex, distal inferolateral wall.  . Anemia   . Arthritis   . Cardiomyopathy, idiopathic (HCC) 02/08/2011  . Chronic diastolic CHF (congestive heart failure) (HCC)    Takes Lasix  . Chronic kidney disease (CKD), stage IV (severe) (HCC)   . Coronary artery disease    a. Cath 09/2010 - med rx.  . Diabetes mellitus    type 2 NIDDM  . Dyslipidemia   . GERD (gastroesophageal reflux disease)   . Gout    takes allopurinol  . H/O echocardiogram 09/06/11   Indication-  nonIschemic Cardiomyopathy. EF = now greater than 55% with no regional wall motion abnormalities. Tthere is mild to moderate trisuspid regurgitayion and mild pulmonary hypertension with an RVSP of 35 mmHg as well as stage 1 diastolic dysfunction and mild to moderate LVH.  Marland Kitchen Hx of transient ischemic attack (TIA)   . Hypertension   . Hypothyroidism    (SEVERE) Takes Levothryroxine  . Irregular heartbeat   . Memory loss   . Myocardial infarct    x 3 unsure of years  . Nonischemic cardiomyopathy (HCC)    EF now is 55%, reduced due to myxedema, which is improved.  . Peripheral neuropathy (HCC)   . Peripheral vascular disease (HCC)    a. s/p L BKA.  . Shingles   . Stroke (HCC)   . TIA (transient ischemic attack)   . Ulcer [facility])     Past Surgical History:  Procedure Laterality Date  . AMPUTATION  07/05/2011   Procedure: AMPUTATION BELOW KNEE;  Surgeon: Chuck Hint, MD;  Location: [facility] OR;  Service: Vascular;  Laterality: Left;  . ANGIOPLASTY  1988  . AV FISTULA PLACEMENT    . AV FISTULA PLACEMENT Right 12/24/2013   Procedure: INSERTION OF ARTERIOVENOUS (AV) GORE-TEX GRAFT ARM;  Surgeon: Chuck Hint, MD;  Location: [facility] OR;  Service: Vascular;  Laterality: Right;  . BACK SURGERY     [facility]  . CARDIAC CATHETERIZATION  09/28/07   Demonstrated multiple sequential lesions around 40 to 30% in the RCA territory.  . Duplex doppler  05/10/11   LE arterial dopplers demonstrate bilaterally reduced ABIs of 0.91  on right & 0.56 on left. She does report some decreased pain on the left, & there's moderate mixed-density plaque in the right SFA w/50 to 69% reduction. There's a 69% reduction in the left SFA & does appear to be occlusive disease of left posterior tibial artery. Right posterior dorsalis pedis artery demonstrates occlusive disease  . ESOPHAGOGASTRODUODENOSCOPY N/A 04/15/2014   Procedure: ESOPHAGOGASTRODUODENOSCOPY (EGD);  Surgeon: Rachael Fee, MD;  Location: [facility]  ENDOSCOPY;  Service: Endoscopy;  Laterality: N/A;  . EYE SURGERY     Left eye surgery; cataract removal  . INSERTION OF DIALYSIS CATHETER N/A 01/06/2013   Procedure: INSERTION OF DIALYSIS CATHETER;  Surgeon: Larina Earthly, MD;  Location: [facility] OR;  Service: Vascular;  Laterality: N/A;  . left bka  07/05/2011  . left lower extremity venous duplex Left 06/27/11   Summary: No evidence of DVT involving the left lower extremity and right common femoral vein.   Marland Kitchen LIGATION ARTERIOVENOUS GORTEX GRAFT Right 03/25/2014   Procedure: LIGATION ARTERIOVENOUS GORTEX GRAFT;  Surgeon: Chuck Hint, MD;  Location: [facility] OR;  Service: Vascular;  Laterality: Right;  . LOWER EXTREMITY ANGIOGRAM N/A 10/31/2011   Procedure: LOWER EXTREMITY ANGIOGRAM;  Surgeon: Chuck Hint, MD;  Location: [facility] CATH LAB;  Service: Cardiovascular;  Laterality: N/A;  . Lower extremity arterial evaluation  06/27/11   SUMMARY: Right: ABI not ascertained due to false elevation in BP secondary to calcification (posterior tibial artery is non compressible). Left: ABI indicates moderate reduction in arterial flow. Bilateral: Great toe PPG waveforms indicate adequate perfusion. Great toe pressures not obtained due to patient's movements secondary to pain.  Marland Kitchen SHUNTOGRAM N/A 03/17/2014   Procedure: Betsey Amen;  Surgeon: Fransisco Hertz, MD;  Location: [facility] CATH LAB;  Service: Cardiovascular;  Laterality: N/A;    Social history: reports that she quit smoking about 29 years ago. She has a 20.00 pack-year smoking history. She has quit using smokeless tobacco. She reports that she does not drink alcohol or use drugs.  Allergies  Allergen Reactions  . Aspirin Nausea And Vomiting    325 mg (adult strength) [name]     Family History  Problem Relation Age of Onset  . Heart attack Daughter   . Heart disease Daughter     Before age 4  . Cancer Sister     STOMACH  . Diabetes Sister   .  Cancer Brother     BONE  . Diabetes Brother   . Hyperlipidemia Daughter   . Hypertension Daughter   . Heart disease Daughter     before age 90  . Kidney disease Daughter   . Other Daughter     varicose veins  . Diabetes Daughter   . Heart disease Son     before age 109  . Hyperlipidemia Son   . Hypertension Son   . Heart attack Son   . Anesthesia problems Neg Hx   . Hypotension Neg Hx   . Malignant hyperthermia Neg Hx   . Pseudochol deficiency Neg Hx      Prior to Admission medications   Medication Sig Start Date End Date Taking? Authorizing Provider  acetaminophen (TYLENOL) 325 MG tablet Take 650 mg by mouth every 6 (six) hours as needed for mild pain.    Historical Provider, MD  albuterol-ipratropium (COMBIVENT) 18-103 MCG/ACT inhaler Inhale 2 puffs into the lungs at bedtime as needed for wheezing or shortness of breath.  Historical Provider, MD  allopurinol (ZYLOPRIM) 100 MG tablet Take 200 mg by mouth daily.     Historical Provider, MD  aspirin 325 MG tablet Take 1 tablet (325 mg total) by mouth daily. 01/04/16   Alison Murray, MD  calcitRIOL (ROCALTROL) 0.25 MCG capsule Take 0.25 mcg by mouth every other day.  11/04/12   Historical Provider, MD  Calcium Carbonate-Vitamin D (CALCIUM-VITAMIN D) 500-200 MG-UNIT tablet Take 1 tablet by mouth daily.    Historical Provider, MD  docusate sodium (COLACE) 100 MG capsule Take 100 mg by mouth daily as needed for mild constipation.    Historical Provider, MD  famotidine (PEPCID) 20 MG tablet Take 1 tablet (20 mg total) by mouth at bedtime. 11/13/15   Rodolph Bong, MD  feeding supplement (BOOST / RESOURCE BREEZE) LIQD Take 1 Container by mouth 3 (three) times daily between meals. 11/13/15   Rodolph Bong, MD  furosemide (LASIX) 20 MG tablet Take 1 tablet (20 mg total) by mouth daily. 01/20/16   Filbert Schilder, MD  GABAPENTIN PO Take by mouth.    Historical Provider, MD  hydrALAZINE (APRESOLINE) 50 MG tablet Take 1 tablet (50  mg total) by mouth 2 (two) times daily. 10/22/15   Chrystie Nose, MD  isosorbide mononitrate (IMDUR) 60 MG 24 hr tablet Take 60 mg by mouth daily.    Historical Provider, MD  levothyroxine (SYNTHROID, LEVOTHROID) 125 MCG tablet Take 125 mcg by mouth daily before breakfast. Take for 2 weeks 01/10/16 01/23/16  Historical Provider, MD  metoCLOPramide (REGLAN) 5 MG tablet Take 1 tablet (5 mg total) by mouth every 6 (six) hours as needed for nausea. 01/04/16   Alison Murray, MD  metoprolol tartrate (LOPRESSOR) 25 MG tablet Take 12.5 mg by mouth 2 (two) times daily.    Historical Provider, MD  Nutritional Supplements (NUTRI-DRINK PO) Take 1 Can by mouth 3 (three) times daily. Med Pass 1.7    Historical Provider, MD  OXYGEN Inhale 2 L into the lungs continuous. 2 L per minute via nasal cannula    Historical Provider, MD  pravastatin (PRAVACHOL) 40 MG tablet Take 40 mg by mouth every evening.     Historical Provider, MD  RENVELA 800 MG tablet Take 800 mg by mouth 3 (three) times daily with meals.  07/04/13   Historical Provider, MD  sodium bicarbonate 650 MG tablet Take 1 tablet (650 mg total) by mouth daily. 11/13/15   Rodolph Bong, MD  Vitamin D, Ergocalciferol, (DRISDOL) 50000 units CAPS capsule Take 50,000 Units by mouth 2 (two) times a week. On Wednesdays and Fridays    Historical Provider, MD    Physical Exam: Vitals:   04/18/16 1715 04/18/16 1745 04/18/16 1800 04/18/16 1815  BP: 157/95 153/92 152/78 159/82  Pulse: 101 97 88 95  Resp: (!) 33 24 17 23   Temp:      TempSrc:      SpO2: 100% 100% 100% 100%  Weight:      Height:          Constitutional: Elderly female lying in bed, on mild respiratory distress Vitals:   04/18/16 1715 04/18/16 1745 04/18/16 1800 04/18/16 1815  BP: 157/95 153/92 152/78 159/82  Pulse: 101 97 88 95  Resp: (!) 33 24 17 23   Temp:      TempSrc:      SpO2: 100% 100% 100% 100%  Weight:      Height:       Eyes: PERRL, lids and  conjunctivae normal ENMT:  Mucous membranes are moist.  Neck: normal, supple Respiratory: Bibasal crackles and diffuse expiratory wheezing, increased respiratory muscle use. Cardiovascular: Regular rate and rhythm, no murmurs / rubs / gallops. Trace RLE edema+ Abdomen: no tenderness, no masses palpated. Bowel sounds positive.  Musculoskeletal: Left BKA.  Skin: no rashes, lesions, ulcers. No induration Neurologic: Alert awake, following commands.     Labs on Admission: I have personally reviewed following labs and imaging studies  CBC:  Recent Labs Lab 04/18/16 1619  WBC 6.8  NEUTROABS 3.8  HGB 11.6*  HCT 36.2  MCV 87.7  PLT 343   Basic Metabolic Panel:  Recent Labs Lab 04/18/16 1619  NA 142  K 5.1  CL 109  CO2 22  GLUCOSE 165*  BUN 19  CREATININE 2.16*  CALCIUM 9.6   GFR: Estimated Creatinine Clearance: 12.2 mL/min (by C-G formula based on SCr of 2.16 mg/dL (H)). Liver Function Tests:  Recent Labs Lab 04/18/16 1619  AST 47*  ALT 21  ALKPHOS 74  BILITOT 1.8*  PROT 7.2  ALBUMIN 3.7   No results for input(s): LIPASE, AMYLASE in the last 168 hours. No results for input(s): AMMONIA in the last 168 hours. Coagulation Profile: No results for input(s): INR, PROTIME in the last 168 hours. Cardiac Enzymes: No results for input(s): CKTOTAL, CKMB, CKMBINDEX, TROPONINI in the last 168 hours. BNP (last 3 results) No results for input(s): PROBNP in the last 8760 hours. HbA1C: No results for input(s): HGBA1C in the last 72 hours. CBG: No results for input(s): GLUCAP in the last 168 hours. Lipid Profile: No results for input(s): CHOL, HDL, LDLCALC, TRIG, CHOLHDL, LDLDIRECT in the last 72 hours. Thyroid Function Tests: No results for input(s): TSH, T4TOTAL, FREET4, T3FREE, THYROIDAB in the last 72 hours. Anemia Panel: No results for input(s): VITAMINB12, FOLATE, FERRITIN, TIBC, IRON, RETICCTPCT in the last 72 hours. Urine analysis:    Component Value Date/Time   COLORURINE YELLOW  01/16/2016 1243   APPEARANCEUR CLOUDY (A) 01/16/2016 1243   LABSPEC 1.017 01/16/2016 1243   PHURINE 5.5 01/16/2016 1243   GLUCOSEU NEGATIVE 01/16/2016 1243   HGBUR NEGATIVE 01/16/2016 1243   BILIRUBINUR SMALL (A) 01/16/2016 1243   KETONESUR NEGATIVE 01/16/2016 1243   PROTEINUR 100 (A) 01/16/2016 1243   UROBILINOGEN 0.2 08/18/2013 1807   NITRITE NEGATIVE 01/16/2016 1243   LEUKOCYTESUR NEGATIVE 01/16/2016 1243   Sepsis Labs: !!!!!!!!!!!!!!!!!!!!!!!!!!!!!!!!!!!!!!!!!!!! @LABRCNTIP (procalcitonin:4,lacticidven:4) )No results found for this or any previous visit (from the past 240 hour(s)).   Radiological Exams on Admission: Dg Chest Portable 1 View  Result Date: 04/18/2016 CLINICAL DATA:  Short breath EXAM: PORTABLE CHEST 1 VIEW COMPARISON:  None. FINDINGS: Stable enlarged cardiac silhouette. There is lower lobe mild airspace disease similar to prior. Small effusions. Upper lungs are clear. IMPRESSION: Cardiomegaly and lower lobe airspace disease suggests congestive heart failure. Electronically Signed   By: Genevive Bi M.D.   On: 04/18/2016 16:52    EKG: Independently reviewed. Sinus rhythm with nonspecific ST-T wave changes. Unchanged from before.  Assessment/Plan Active Problems:   CHF (congestive heart failure) (HCC)  # Acute on chronic systolic congestive heart failure: Patient was not really getting diuretics for last few days because of vomiting. Patient with respiratory distress, CHF/pulmonary edema on x-ray, elevated BNP, elevated troponin level all consistent with CHF exacerbation. -I will continue Lasix 60 IV every 6 hourly, place Foley catheter, daily weight, strict ins and outs. -Repeat echocardiogram -Trend troponin -Continue hydralazine, Imdur, metoprolol. Also on aspirin. -Patient will be  admitted in the stepdown unit because of labored breathing/respirator distress. I will order BiPAP at night if needed. Respiratory consult was ordered. Nebs with DuoNeb. -Patient  is not hypoxic at that time. Continue to monitor. Denied chest pain as well.  #Chronic kidney disease stage IV: Serum creatinine level around baseline. On IV Lasix for CHF. Monitor urine output. Monitor BMP. Avoid nephrotoxins.  #Hypertension: Continue home medications. Monitor blood pressure.  #Peripheral vascular disease status post left BKA: Continue aspirin  #Synthroid was listed as old home medications but not on current regimen. I will check TSH level.  #Goals of care discussion: I discussed the goals of care with the patient's son and daughters at bedside. They want to pursue full code this time and rediscuss depending on clinical improvement.  DVT prophylaxis: Low dose Lovenox Code Status: Full code for now. Family Communication: Discussed with the patient's son and 2 daughters at bedside in the ER Disposition Plan: We'll admit the stepdown Consults called: None Admission status: Inpatient.   Dron Jaynie Collins MD [facility] Pager [phone]  If 7PM-7AM, please contact night-coverage [email or web address] Password [facility]  04/18/2016, 6:25 PM

## 2016-04-18 NOTE — ED Triage Notes (Signed)
Pt arrived via [facility] from home c/o SOB, CHF exacerbation.  Pt does not wear O2 at home.  Non productive cough.  Denies any pain.

## 2016-04-18 NOTE — ED Notes (Signed)
I stat troponin results 0.09 ng/mL reported to Dr. Dalene Seltzer by B. Bing Plume, EMT

## 2016-04-19 ENCOUNTER — Encounter (HOSPITAL_COMMUNITY): Payer: Self-pay

## 2016-04-19 DIAGNOSIS — N184 Chronic kidney disease, stage 4 (severe): Secondary | ICD-10-CM

## 2016-04-19 DIAGNOSIS — E038 Other specified hypothyroidism: Secondary | ICD-10-CM

## 2016-04-19 DIAGNOSIS — E039 Hypothyroidism, unspecified: Secondary | ICD-10-CM

## 2016-04-19 DIAGNOSIS — Z89612 Acquired absence of left leg above knee: Secondary | ICD-10-CM

## 2016-04-19 DIAGNOSIS — I5023 Acute on chronic systolic (congestive) heart failure: Secondary | ICD-10-CM

## 2016-04-19 DIAGNOSIS — I43 Cardiomyopathy in diseases classified elsewhere: Secondary | ICD-10-CM

## 2016-04-19 LAB — TROPONIN I: TROPONIN I: 0.09 ng/mL — AB (ref <0.03)

## 2016-04-19 LAB — BASIC METABOLIC PANEL
Anion gap: 13 (ref 5–15)
BUN: 19 mg/dL (ref 6–20)
CALCIUM: 10 mg/dL (ref 8.9–10.3)
CO2: 23 mmol/L (ref 22–32)
CREATININE: 2 mg/dL — AB (ref 0.44–1.00)
Chloride: 107 mmol/L (ref 101–111)
GFR calc Af Amer: 24 mL/min — ABNORMAL LOW (ref >60)
GFR calc non Af Amer: 21 mL/min — ABNORMAL LOW (ref >60)
GLUCOSE: 153 mg/dL — AB (ref 65–99)
Potassium: 3.9 mmol/L (ref 3.5–5.1)
Sodium: 143 mmol/L (ref 135–145)

## 2016-04-19 LAB — T4, FREE: FREE T4: 0.41 ng/dL — AB (ref 0.61–1.12)

## 2016-04-19 MED ORDER — IPRATROPIUM-ALBUTEROL 0.5-2.5 (3) MG/3ML IN SOLN
3.0000 mL | Freq: Two times a day (BID) | RESPIRATORY_TRACT | Status: DC
  Administered 2016-04-19 – 2016-04-20 (×2): 3 mL via RESPIRATORY_TRACT
  Filled 2016-04-19 (×2): qty 3

## 2016-04-19 MED ORDER — LEVOTHYROXINE SODIUM 100 MCG PO TABS
100.0000 ug | ORAL_TABLET | Freq: Every day | ORAL | Status: DC
  Administered 2016-04-19 – 2016-04-23 (×5): 100 ug via ORAL
  Filled 2016-04-19 (×5): qty 1

## 2016-04-19 NOTE — Progress Notes (Signed)
PROGRESS NOTE    [name]  ZOX:[id number] DOB: 1924/01/25 DOA: 04/18/2016 PCP: Georgianne Fick, MD   Brief Narrative: 83 y.o. female with medical history significant of hypertension, chronic kidney disease stage IV, congestive heart failure with EF of 30%-35 % , peripheral vascular disease status post left BKA, prior stroke presented from home with worsening shortness of breath, dyspnea on exertion for worsening for 2 days  Assessment & Plan:  Active Problems:   CHF (congestive heart failure) (HCC)  # Acute on chronic systolic congestive heart failure: Patient with respiratory distress, CHF/pulmonary edema on x-ray, elevated BNP, elevated troponin level all consistent with CHF exacerbation. -Continue Lasix 60 IV every 6 hourly, place Foley catheter, daily weight, strict ins and outs. Discussed with RN regarding catheter placement, it was ordered on admisssion. No exact measurement of urine out recorded.  -pending  echocardiogram -Continue hydralazine, Imdur, metoprolol. Also on aspirin. -Patient is clinically better today. No chest pain. Shortness of breath is better. I discussed with the patient's nurse regarding exact measurement of urine output. Cardiology consult was requested, as per family's request.  #Chronic kidney disease stage IV: Serum creatinine level around baseline creatinine level to today. On IV Lasix for CHF. Monitor urine output. Monitor BMP. Avoid nephrotoxins.  #Hypertension: Blood pressure acceptable. Continue home medications. Monitor blood pressure.  #Peripheral vascular disease status post left BKA: Continue aspirin  #Hypothyroidism: Apparently patient was not to taking Synthroid. TSH was markedly elevated at 261. I added free T3 and T4 level. Started Synthroid 100 g which is patient's prior home dose.  #Goals of care discussion: I discussed the goals of care with the patient's son and daughters on admission. Full code at this time. DVT prophylaxis:  Lovenox subcutaneous Code Status: Full code Family Communication:  discussed with the patient's son at bedside Disposition Plan: Likely discharge home in 1-2 days. PT OT ordered.    Consultants:   Cardiology  Procedures: Pending echo Antimicrobials: None  Subjective: Patient was seen and examined at bedside. Patient reported feeling mild improvement in her breathing today. Denied chest pain, nausea vomiting or abdominal pain. Patient's son at bedside.   Objective: Vitals:   04/19/16 0758 04/19/16 0846 04/19/16 1100 04/19/16 1200  BP: (!) 168/94  139/82   Pulse: 89  87 80  Resp:   10   Temp: (!) 96.9 F (36.1 C)  97.6 F (36.4 C)   TempSrc: Axillary  Oral   SpO2: 96% 95% 96% 98%  Weight:      Height:        Intake/Output Summary (Last 24 hours) at 04/19/16 1242 Last data filed at 04/19/16 0900  Gross per 24 hour  Intake              100 ml  Output                0 ml  Net              100 ml   Filed Weights   04/18/16 1605 04/19/16 0344  Weight: 50.3 kg (111 lb) 50.9 kg (112 lb 3.4 oz)    Examination:  General exam: Pleasant elderly female lying on bed, not in distress today.   Respiratory system: Bibasal crackles and intermittent wheeze, better than yesterday. Cardiovascular system: S1 & S2 heard, RRR.  No pedal edema. Gastrointestinal system: Abdomen is nondistended, soft and nontender. Normal bowel sounds heard. Central nervous system: Alert awake and following commands. Extremities: Symmetric 5 x 5 power. Skin: No  rashes, lesions or ulcers     Data Reviewed: I have personally reviewed following labs and imaging studies  CBC:  Recent Labs Lab 04/18/16 1619  WBC 6.8  NEUTROABS 3.8  HGB 11.6*  HCT 36.2  MCV 87.7  PLT 343   Basic Metabolic Panel:  Recent Labs Lab 04/18/16 1619 04/19/16 0437  NA 142 143  K 5.1 3.9  CL 109 107  CO2 22 23  GLUCOSE 165* 153*  BUN 19 19  CREATININE 2.16* 2.00*  CALCIUM 9.6 10.0   GFR: Estimated  Creatinine Clearance: 13.2 mL/min (by C-G formula based on SCr of 2 mg/dL (H)). Liver Function Tests:  Recent Labs Lab 04/18/16 1619  AST 47*  ALT 21  ALKPHOS 74  BILITOT 1.8*  PROT 7.2  ALBUMIN 3.7   No results for input(s): LIPASE, AMYLASE in the last 168 hours. No results for input(s): AMMONIA in the last 168 hours. Coagulation Profile: No results for input(s): INR, PROTIME in the last 168 hours. Cardiac Enzymes:  Recent Labs Lab 04/18/16 2135 04/19/16 0437  TROPONINI 0.07* 0.09*   BNP (last 3 results) No results for input(s): PROBNP in the last 8760 hours. HbA1C: No results for input(s): HGBA1C in the last 72 hours. CBG: No results for input(s): GLUCAP in the last 168 hours. Lipid Profile: No results for input(s): CHOL, HDL, LDLCALC, TRIG, CHOLHDL, LDLDIRECT in the last 72 hours. Thyroid Function Tests:  Recent Labs  04/18/16 2111  TSH 261.867*   Anemia Panel: No results for input(s): VITAMINB12, FOLATE, FERRITIN, TIBC, IRON, RETICCTPCT in the last 72 hours. Sepsis Labs: No results for input(s): PROCALCITON, LATICACIDVEN in the last 168 hours.  Recent Results (from the past 240 hour(s))  MRSA PCR Screening     Status: None   Collection Time: 04/18/16  8:50 PM  Result Value Ref Range Status   MRSA by PCR NEGATIVE NEGATIVE Final    Comment:        The GeneXpert MRSA Assay (FDA approved for NASAL specimens only), is one component of a comprehensive MRSA colonization surveillance program. It is not intended to diagnose MRSA infection nor to guide or monitor treatment for MRSA infections.          Radiology Studies: Dg Chest Portable 1 View  Result Date: 04/18/2016 CLINICAL DATA:  Short breath EXAM: PORTABLE CHEST 1 VIEW COMPARISON:  None. FINDINGS: Stable enlarged cardiac silhouette. There is lower lobe mild airspace disease similar to prior. Small effusions. Upper lungs are clear. IMPRESSION: Cardiomegaly and lower lobe airspace disease suggests  congestive heart failure. Electronically Signed   By: Genevive Bi M.D.   On: 04/18/2016 16:52        Scheduled Meds: . allopurinol  200 mg Oral Daily  . aspirin EC  325 mg Oral Daily  . calcitRIOL  0.25 mcg Oral QODAY  . enoxaparin (LOVENOX) injection  30 mg Subcutaneous Q24H  . famotidine  20 mg Oral QHS  . feeding supplement  1 Container Oral TID BM  . furosemide  60 mg Intravenous Q6H  . hydrALAZINE  50 mg Oral BID  . ipratropium-albuterol  3 mL Nebulization BID  . isosorbide mononitrate  60 mg Oral Daily  . levothyroxine  100 mcg Oral QAC breakfast  . metoprolol tartrate  12.5 mg Oral BID  . pravastatin  40 mg Oral QPM  . sevelamer carbonate  800 mg Oral TID WC  . sodium chloride flush  3 mL Intravenous Q12H  . [START ON 04/20/2016] Vitamin D (  Ergocalciferol)  50,000 Units Oral Once per day on Wed Fri   Continuous Infusions:   LOS: 1 day    Dron Jaynie Collins, MD [facility] Pager [phone]  If 7PM-7AM, please contact night-coverage [email or web address] Password [email or web address] 04/19/2016, 12:42 PM

## 2016-04-19 NOTE — Progress Notes (Signed)
Pt refuse BiPAP for the night

## 2016-04-19 NOTE — Consult Note (Signed)
Cardiology Consult    Patient ID: [name] MRN: [id number], DOB/AGE: [birth year]   Admit date: 04/18/2016 Date of Consult: 04/19/2016  Primary Physician: Shannon Fick, MD Primary Cardiologist: Dr. Rennis [name] Requesting Provider: Dr. Ronalee [name] Reason for Consultation: CHF  Patient Profile    83 yo female with PMH of HTN, hypothyroidism, CKD IV, chronic systolic HF, PVD, CVA who presented with ongoing dyspnea.   Past Medical History   Past Medical History:  Diagnosis Date  . Abnormal nuclear stress test 06/01/09   Demonstrated a new area of infarct scar, peri-infarct ischemia seen in the inferolateral territory. EF eas normal at 70% with mild hypocontractility at the apex, distal inferolateral wall.  . Anemia   . Arthritis   . Cardiomyopathy, idiopathic (HCC) 02/08/2011  . Chronic diastolic CHF (congestive heart failure) (HCC)    Takes Lasix  . Chronic kidney disease (CKD), stage IV (severe) (HCC)   . Coronary artery disease    a. Cath 09/2010 - med rx.  . Diabetes mellitus    type 2 NIDDM  . Dyslipidemia   . GERD (gastroesophageal reflux disease)   . Gout    takes allopurinol  . H/O echocardiogram 09/06/11   Indication- nonIschemic Cardiomyopathy. EF = now greater than 55% with no regional wall motion abnormalities. Tthere is mild to moderate trisuspid regurgitayion and mild pulmonary hypertension with an RVSP of 35 mmHg as well as stage 1 diastolic dysfunction and mild to moderate LVH.  Marland Kitchen Hx of transient ischemic attack (TIA)   . Hypertension   . Hypothyroidism    (SEVERE) Takes Levothryroxine  . Irregular heartbeat   . Memory loss   . Myocardial infarct    x 3 unsure of years  . Nonischemic cardiomyopathy (HCC)    EF now is 55%, reduced due to myxedema, which is improved.  . Peripheral neuropathy (HCC)   . Peripheral vascular disease (HCC)    a. s/p L BKA.  . Shingles   . Stroke (HCC)   . TIA (transient ischemic attack)   . Ulcer [facility])     Past Surgical  History:  Procedure Laterality Date  . AMPUTATION  07/05/2011   Procedure: AMPUTATION BELOW KNEE;  Surgeon: Shannon Hint, MD;  Location[facility] OR;  Service: Vascular;  Laterality: Left;  . ANGIOPLASTY  1988  . AV FISTULA PLACEMENT    . AV FISTULA PLACEMENT Right 12/24/2013   Procedure: INSERTION OF ARTERIOVENOUS (AV) GORE-TEX GRAFT ARM;  Surgeon: Shannon Hint, MD;  Location: [facility] OR;  Service: Vascular;  Laterality: Right;  . BACK SURGERY     [facility]  . CARDIAC CATHETERIZATION  09/28/07   Demonstrated multiple sequential lesions around 40 to 30% in the RCA territory.  . Duplex doppler  05/10/11   LE arterial dopplers demonstrate bilaterally reduced ABIs of 0.91 on right & 0.56 on left. She does report some decreased pain on the left, & there's moderate mixed-density plaque in the right SFA w/50 to 69% reduction. There's a 69% reduction in the left SFA & does appear to be occlusive disease of left posterior tibial artery. Right posterior dorsalis pedis artery demonstrates occlusive disease  . ESOPHAGOGASTRODUODENOSCOPY N/A 04/15/2014   Procedure: ESOPHAGOGASTRODUODENOSCOPY (EGD);  Surgeon: Shannon Fee, MD;  Location: [facility] ENDOSCOPY;  Service: Endoscopy;  Laterality: N/A;  . EYE SURGERY     Left eye surgery; cataract removal  . INSERTION OF DIALYSIS CATHETER N/A 01/06/2013   Procedure: INSERTION OF DIALYSIS CATHETER;  Surgeon: Shannon Earthly, MD;  Location: [facility] OR;  Service: Vascular;  Laterality: N/A;  . left bka  07/05/2011  . left lower extremity venous duplex Left 06/27/11   Summary: No evidence of DVT involving the left lower extremity and right common femoral vein.   Marland Kitchen LIGATION ARTERIOVENOUS GORTEX GRAFT Right 03/25/2014   Procedure: LIGATION ARTERIOVENOUS GORTEX GRAFT;  Surgeon: Shannon Hint, MD;  Location: [facility] OR;  Service: Vascular;  Laterality: Right;  . LOWER EXTREMITY ANGIOGRAM N/A 10/31/2011   Procedure: LOWER EXTREMITY ANGIOGRAM;  Surgeon: Shannon Hint, MD;  Location: [facility] CATH LAB;  Service: Cardiovascular;  Laterality: N/A;  . Lower extremity arterial evaluation  06/27/11   SUMMARY: Right: ABI not ascertained due to false elevation in BP secondary to calcification (posterior tibial artery is non compressible). Left: ABI indicates moderate reduction in arterial flow. Bilateral: Great toe PPG waveforms indicate adequate perfusion. Great toe pressures not obtained due to patient's movements secondary to pain.  Marland Kitchen SHUNTOGRAM N/A 03/17/2014   Procedure: [name];  Surgeon: Shannon Hertz, MD;  Location: [facility] CATH LAB;  Service: Cardiovascular;  Laterality: N/A;     Allergies  Allergies  Allergen Reactions  . Aspirin Nausea And Vomiting and Other (See Comments)    325 mg (adult strength) [name]     History of Present Illness    [name] is a 83 yo female with PMH of  HTN, hypothroidism, CKD IV, chronic systolic HF, PVD, CVA. She was last seen in the office 7/17 after an ER admission with hypertension, abd pain n/v. EKG while in the ED showed prolong QT interval, but follow up in the office showed normal QTc. BB was stopped previous due to junctional rhythm in the 40s. Blood pressure remained elevated and hydralazine 25mg  BID was added to her medications.   Last Echo 10/17 showed reduced EF from 50-55% to 30-35% with moderate diffuse hypokinesis with distinct WMA.   She currently lives with her son who helps with ADLs and medications. At her baseline she has dementia. Presented with n/v, along with dyspnea and lower extremity edema. Labs showed stable electrolytes, BNP 2284, Trop 0.09>>0.07>>0.09, Hgb 11.6, TSH 261. CXR noted cardiomegaly and CHF. She was started on IV lasix, though I&Os have not been recorded.   Inpatient Medications    . allopurinol  200 mg Oral Daily  . aspirin EC  325 mg Oral Daily  . calcitRIOL  0.25 mcg Oral QODAY  . enoxaparin (LOVENOX) injection  30 mg  Subcutaneous Q24H  . famotidine  20 mg Oral QHS  . feeding supplement  1 Container Oral TID BM  . furosemide  60 mg Intravenous Q6H  . hydrALAZINE  50 mg Oral BID  . ipratropium-albuterol  3 mL Nebulization BID  . isosorbide mononitrate  60 mg Oral Daily  . levothyroxine  100 mcg Oral QAC breakfast  . metoprolol tartrate  12.5 mg Oral BID  . pravastatin  40 mg Oral QPM  . sevelamer carbonate  800 mg Oral TID WC  . sodium chloride flush  3 mL Intravenous Q12H  . [START ON 04/20/2016] Vitamin D (Ergocalciferol)  50,000 Units Oral Once per day on Wed Fri    Family History    Family History  Problem Relation Age of Onset  . Heart attack Daughter   . Heart disease Daughter     Before age 73  . Cancer Sister     STOMACH  . Diabetes Sister   .  Cancer Brother     BONE  . Diabetes Brother   . Hyperlipidemia Daughter   . Hypertension Daughter   . Heart disease Daughter     before age 41  . Kidney disease Daughter   . Other Daughter     varicose veins  . Diabetes Daughter   . Heart disease Son     before age 104  . Hyperlipidemia Son   . Hypertension Son   . Heart attack Son   . Anesthesia problems Neg Hx   . Hypotension Neg Hx   . Malignant hyperthermia Neg Hx   . Pseudochol deficiency Neg Hx     Social History    Social History   Social History  . Marital status: Widowed    Spouse name: N/A  . Number of children: 4  . Years of education: N/A   Occupational History  . homemaker    Social History Main Topics  . Smoking status: Former Smoker    Packs/day: 0.50    Years: 40.00    Quit date: 07/05/1986  . Smokeless tobacco: Former Neurosurgeon     Comment: quit smoking 1988  . Alcohol use No  . Drug use: No  . Sexual activity: No   Other Topics Concern  . Not on file   Social History Narrative  . No narrative on file     Review of Systems    General:  No chills, fever, night sweats or weight changes.  Cardiovascular:  No chest pain, dyspnea on exertion, edema,  orthopnea, palpitations, paroxysmal nocturnal dyspnea. Dermatological: No rash, lesions/masses Respiratory: No cough, ++ dyspnea Urologic: No hematuria, dysuria Abdominal:   ++ nausea, ++ vomiting, diarrhea, bright red blood per rectum, melena, or hematemesis Neurologic:  No visual changes, wkns, changes in mental status. All other systems reviewed and are otherwise negative except as noted above.  Physical Exam    Blood pressure 139/82, pulse 80, temperature 97.6 F (36.4 C), temperature source Oral, resp. rate 10, height 5' (1.524 m), weight 112 lb 3.4 oz (50.9 kg), SpO2 98 %.  General: Pleasant but confused older AAF, NAD Psych: Normal affect. Neuro: Alert to self. Moves all extremities spontaneously. HEENT: Normal  Neck: Supple, + JVD. Lungs:  Resp regular and unlabored, crackles at bases bilaterally, mild wheezing Heart: RRR + s3, s4, or murmurs. Abdomen: Soft, non-tender, non-distended, BS + x 4.  Extremities: No clubbing, cyanosis or edema. DP/PT/Radials 2+ on right. Left BKA.  Labs    Troponin [facility] Test)  Recent Labs  04/18/16 1632  TROPIPOC 0.09*    Recent Labs  04/18/16 2135 04/19/16 0437  TROPONINI 0.07* 0.09*   Lab Results  Component Value Date   WBC 6.8 04/18/2016   HGB 11.6 (L) 04/18/2016   HCT 36.2 04/18/2016   MCV 87.7 04/18/2016   PLT 343 04/18/2016    Recent Labs Lab 04/18/16 1619 04/19/16 0437  NA 142 143  K 5.1 3.9  CL 109 107  CO2 22 23  BUN 19 19  CREATININE 2.16* 2.00*  CALCIUM 9.6 10.0  PROT 7.2  --   BILITOT 1.8*  --   ALKPHOS 74  --   ALT 21  --   AST 47*  --   GLUCOSE 165* 153*   Lab Results  Component Value Date   CHOL 301 (H) 12/29/2015   HDL 72 12/29/2015   LDLCALC 188 (H) 12/29/2015   TRIG 205 (H) 12/29/2015   Lab Results  Component Value Date  DDIMER (H) 09/23/2007    0.75        AT THE INHOUSE ESTABLISHED CUTOFF VALUE OF 0.48 ug/mL FEU, THIS ASSAY HAS BEEN DOCUMENTED IN THE LITERATURE TO HAVE      Radiology Studies    Dg Chest Portable 1 View  Result Date: 04/18/2016 CLINICAL DATA:  Short breath EXAM: PORTABLE CHEST 1 VIEW COMPARISON:  None. FINDINGS: Stable enlarged cardiac silhouette. There is lower lobe mild airspace disease similar to prior. Small effusions. Upper lungs are clear. IMPRESSION: Cardiomegaly and lower lobe airspace disease suggests congestive heart failure. Electronically Signed   By: Genevive Bi M.D.   On: 04/18/2016 16:52    ECG & Cardiac Imaging    EKG: SR with TWI in inferolateral leads noted on previous EKGs.  Echo: 10/17  Study Conclusions  - Left ventricle: The cavity size was normal. There was mild   concentric hypertrophy. Systolic function was moderately to   severely reduced. The estimated ejection fraction was in the   range of 30% to 35%. Moderate diffuse hypokinesis with distinct   regional wall motion abnormalities. Severe hypokinesis of the   anteroseptal, anterior, and apical myocardium; consistent with   ischemia in the distribution of the left anterior descending   coronary artery. Possible dyskinesis of the apical myocardium.   Hypokinesis of the mid-apicalanterolateral and inferolateral   myocardium; consistent with ischemia in the distribution of the   left circumflex coronary artery. Due to tachycardia, there was   fusion of early and atrial contributions to ventricular filling.   Doppler parameters are consistent with restrictive physiology,   indicative of decreased left ventricular diastolic compliance   and/or increased left atrial pressure. Acoustic contrast   opacification revealed no evidence ofthrombus. - Ventricular septum: Septal motion showed paradox. These changes   are consistent with a left bundle branch block. - Aortic valve: There was trivial regurgitation. - Mitral valve: There was mild to moderate regurgitation directed   toward the free wall. - Right ventricle: Systolic function was mildly reduced.  Severe   hypokinesis of the RV apex. - Tricuspid valve: There was moderate regurgitation directed   centrally. - Pulmonary arteries: Systolic pressure was severely increased. PA   peak pressure: 64 mm Hg (S). - Extensive new wall motion abnormalities in both left and right   ventricle, in the distribution of all three major coronary   arteries and marked reduction in LV function, new from previous   report.  Assessment & Plan    83 yo female with PMH of HTN, hypothyroidism, CKD IV, chronic systolic HF, PVD, CVA who presented with ongoing dyspnea.   1. Acute on Chronic systolic HF: Presented with progressive dyspnea over the past couple of days leading up to admission. Currently lives with her son who helps with ADLs and her medications. Reports she had been nauseated/vomited. Presented with elevated BNP, TSH 261. Was not taking her synthroid at home.  -- Echo 10/17 showed reduced EF of 30-35% from previous in 2015 (50-55%). Question whether she has been getting correct dosing of her lasix at home. Home dose 80mg  PO BID. Started on IV lasix 60mg  q6hr here, no I&Os documented yesterday. Weight seems stable.  -- repeat echo pending.  -- may need to consider palliative care consult given the progression of her disease process.   2. HTN: Elevated this admission. May have a degree of hypertensive heart disease? Room to further titrate up hydralazine this admission.   3. CKD IV: Stable with diuresis, would  continue with IV lasix for now.   4. Hypothyroidism: TSH on admission 261. Synthroid has been restarted, may to consider giving IV.   Janice Coffin, NP-C Pager [phone] 04/19/2016, 3:16 PM

## 2016-04-19 NOTE — Progress Notes (Signed)
CRITICAL VALUE ALERT  Critical value received:  Troponin 0.07  Date of notification:  04/18/2016  Time of notification:  2230  Critical value read back: yes  Nurse who received alert: Wendelyn Breslow, RN  MD notified (1st page):  MD aware of elevated Troponin levels

## 2016-04-20 ENCOUNTER — Inpatient Hospital Stay (HOSPITAL_COMMUNITY): Payer: Medicare Other

## 2016-04-20 DIAGNOSIS — Z7189 Other specified counseling: Secondary | ICD-10-CM

## 2016-04-20 DIAGNOSIS — I110 Hypertensive heart disease with heart failure: Secondary | ICD-10-CM

## 2016-04-20 DIAGNOSIS — I739 Peripheral vascular disease, unspecified: Secondary | ICD-10-CM

## 2016-04-20 DIAGNOSIS — I509 Heart failure, unspecified: Secondary | ICD-10-CM

## 2016-04-20 LAB — BASIC METABOLIC PANEL
ANION GAP: 11 (ref 5–15)
BUN: 20 mg/dL (ref 6–20)
CALCIUM: 9.6 mg/dL (ref 8.9–10.3)
CO2: 22 mmol/L (ref 22–32)
CREATININE: 2.23 mg/dL — AB (ref 0.44–1.00)
Chloride: 108 mmol/L (ref 101–111)
GFR, EST AFRICAN AMERICAN: 21 mL/min — AB (ref >60)
GFR, EST NON AFRICAN AMERICAN: 18 mL/min — AB (ref >60)
Glucose, Bld: 148 mg/dL — ABNORMAL HIGH (ref 65–99)
Potassium: 3.2 mmol/L — ABNORMAL LOW (ref 3.5–5.1)
SODIUM: 141 mmol/L (ref 135–145)

## 2016-04-20 LAB — ECHOCARDIOGRAM COMPLETE
HEIGHTINCHES: 60 in
Weight: 1717.82 oz

## 2016-04-20 LAB — T3, FREE: T3 FREE: 0.7 pg/mL — AB (ref 2.0–4.4)

## 2016-04-20 MED ORDER — FUROSEMIDE 10 MG/ML IJ SOLN: 60.0000 mg | Freq: Every day | INTRAMUSCULAR | Status: DC

## 2016-04-20 MED ORDER — NYSTATIN 100000 UNIT/ML MT SUSP
5.0000 mL | Freq: Four times a day (QID) | OROMUCOSAL | Status: DC
  Administered 2016-04-20 – 2016-04-23 (×10): 500000 [IU] via OROMUCOSAL
  Filled 2016-04-20 (×10): qty 5

## 2016-04-20 MED ORDER — IPRATROPIUM-ALBUTEROL 0.5-2.5 (3) MG/3ML IN SOLN: 3.0000 mL | Freq: Four times a day (QID) | RESPIRATORY_TRACT | Status: DC | PRN

## 2016-04-20 MED ORDER — FUROSEMIDE 10 MG/ML IJ SOLN: 40.0000 mg | Freq: Every day | INTRAMUSCULAR | Status: DC

## 2016-04-20 MED ORDER — POTASSIUM CHLORIDE 20 MEQ PO PACK
40.0000 meq | PACK | Freq: Every day | ORAL | Status: DC
  Administered 2016-04-20 – 2016-04-21 (×2): 40 meq via ORAL
  Filled 2016-04-20 (×3): qty 2

## 2016-04-20 NOTE — Progress Notes (Addendum)
DAILY PROGRESS NOTE  Subjective:  Very somnolent today - somewhat confused. No distress. Only about 500 cc negative recorded overnight. Weight appears to be down to 107 from 112 lbs yesterday. Potassium is low today - creatinine rose to 2.23 overnight - ?due to aggressive diuresis.  Objective:  Temp:  [97.6 F (36.4 C)-98 F (36.7 C)] 97.7 F (36.5 C) (01/31 0352) Pulse Rate:  [80-87] 81 (01/30 1500) Resp:  [10-27] 24 (01/31 0400) BP: (139-159)/(60-92) 150/72 (01/31 0400) SpO2:  [95 %-98 %] 96 % (01/31 0352) Weight:  [107 lb 5.8 oz (48.7 kg)] 107 lb 5.8 oz (48.7 kg) (01/31 0352) Weight change: -3 lb 10.2 oz (-1.649 kg)  Intake/Output from previous day: 01/30 0701 - 01/31 0700 In: 370 [P.O.:370] Out: 950 [Urine:950]  Intake/Output from this shift: No intake/output data recorded.  Medications: No current facility-administered medications on file prior to encounter.    Current Outpatient Prescriptions on File Prior to Encounter  Medication Sig Dispense Refill  . acetaminophen (TYLENOL) 325 MG tablet Take 650 mg by mouth every 6 (six) hours as needed for mild pain.    Marland Kitchen albuterol-ipratropium (COMBIVENT) 18-103 MCG/ACT inhaler Inhale 1-2 puffs into the lungs at bedtime as needed for wheezing or shortness of breath.     . allopurinol (ZYLOPRIM) 100 MG tablet Take 100 mg by mouth daily.     Marland Kitchen aspirin 325 MG tablet Take 1 tablet (325 mg total) by mouth daily. 30 tablet 0  . calcitRIOL (ROCALTROL) 0.25 MCG capsule Take 0.25 mcg by mouth every other day.     . Calcium Carbonate-Vitamin D (CALCIUM-VITAMIN D) 500-200 MG-UNIT tablet Take 1 tablet by mouth daily.    Marland Kitchen docusate sodium (COLACE) 100 MG capsule Take 100 mg by mouth daily as needed for mild constipation.    . famotidine (PEPCID) 20 MG tablet Take 1 tablet (20 mg total) by mouth at bedtime. (Patient taking differently: Take 20 mg by mouth at bedtime as needed for heartburn. ) 30 tablet 3  . furosemide (LASIX) 20 MG tablet  Take 1 tablet (20 mg total) by mouth daily. (Patient taking differently: Take 80 mg by mouth daily. ) 30 tablet 0  . gabapentin (NEURONTIN) 300 MG capsule Take 300 mg by mouth 3 (three) times daily.     . hydrALAZINE (APRESOLINE) 50 MG tablet Take 1 tablet (50 mg total) by mouth 2 (two) times daily. (Patient taking differently: Take 50 mg by mouth 3 (three) times daily. ) 30 tablet 1  . isosorbide mononitrate (IMDUR) 60 MG 24 hr tablet Take 60 mg by mouth daily.    . metoCLOPramide (REGLAN) 5 MG tablet Take 1 tablet (5 mg total) by mouth every 6 (six) hours as needed for nausea. 120 tablet 3  . pravastatin (PRAVACHOL) 40 MG tablet Take 40 mg by mouth every evening.     Marland Kitchen RENVELA 800 MG tablet Take 800 mg by mouth 3 (three) times daily with meals.     . feeding supplement (BOOST / RESOURCE BREEZE) LIQD Take 1 Container by mouth 3 (three) times daily between meals. (Patient not taking: Reported on 04/19/2016)  0  . sodium bicarbonate 650 MG tablet Take 1 tablet (650 mg total) by mouth daily. (Patient not taking: Reported on 04/19/2016) 30 tablet 0    Physical Exam: General appearance: alert and no distress Neck: JVD - 3 cm above sternal notch and no carotid bruit Lungs: clear to auscultation bilaterally Heart: regular rate and rhythm, S1, S2 normal, no  murmur, click, rub or gallop Abdomen: soft, non-tender; bowel sounds normal; no masses,  no organomegaly Extremities: left AKA Pulses: 2+ and symmetric Skin: Skin color, texture, turgor normal. No rashes or lesions Neurologic: Grossly normal Psych: Flat affect  Lab Results: Results for orders placed or performed during the hospital encounter of 04/18/16 (from the past 48 hour(s))  CBC with Differential     Status: Abnormal   Collection Time: 04/18/16  4:19 PM  Result Value Ref Range   WBC 6.8 4.0 - 10.5 K/uL   RBC 4.13 3.87 - 5.11 MIL/uL   Hemoglobin 11.6 (L) 12.0 - 15.0 g/dL   HCT 36.2 36.0 - 46.0 %   MCV 87.7 78.0 - 100.0 fL   MCH 28.1  26.0 - 34.0 pg   MCHC 32.0 30.0 - 36.0 g/dL   RDW 15.8 (H) 11.5 - 15.5 %   Platelets 343 150 - 400 K/uL   Neutrophils Relative % 56 %   Neutro Abs 3.8 1.7 - 7.7 K/uL   Lymphocytes Relative 31 %   Lymphs Abs 2.1 0.7 - 4.0 K/uL   Monocytes Relative 9 %   Monocytes Absolute 0.6 0.1 - 1.0 K/uL   Eosinophils Relative 3 %   Eosinophils Absolute 0.2 0.0 - 0.7 K/uL   Basophils Relative 1 %   Basophils Absolute 0.1 0.0 - 0.1 K/uL  Comprehensive metabolic panel     Status: Abnormal   Collection Time: 04/18/16  4:19 PM  Result Value Ref Range   Sodium 142 135 - 145 mmol/L   Potassium 5.1 3.5 - 5.1 mmol/L    Comment: HEMOLYSIS AT THIS LEVEL MAY AFFECT RESULT   Chloride 109 101 - 111 mmol/L   CO2 22 22 - 32 mmol/L   Glucose, Bld 165 (H) 65 - 99 mg/dL   BUN 19 6 - 20 mg/dL   Creatinine, Ser 2.16 (H) 0.44 - 1.00 mg/dL   Calcium 9.6 8.9 - 10.3 mg/dL   Total Protein 7.2 6.5 - 8.1 g/dL   Albumin 3.7 3.5 - 5.0 g/dL   AST 47 (H) 15 - 41 U/L   ALT 21 14 - 54 U/L   Alkaline Phosphatase 74 38 - 126 U/L   Total Bilirubin 1.8 (H) 0.3 - 1.2 mg/dL   GFR calc non Af Amer 19 (L) >60 mL/min   GFR calc Af Amer 22 (L) >60 mL/min    Comment: (NOTE) The eGFR has been calculated using the CKD EPI equation. This calculation has not been validated in all clinical situations. eGFR's persistently <60 mL/min signify possible Chronic Kidney Disease.    Anion gap 11 5 - 15  Brain natriuretic peptide     Status: Abnormal   Collection Time: 04/18/16  4:20 PM  Result Value Ref Range   B Natriuretic Peptide 2,284.3 (H) 0.0 - 100.0 pg/mL  I-stat troponin, ED     Status: Abnormal   Collection Time: 04/18/16  4:32 PM  Result Value Ref Range   Troponin i, poc 0.09 (HH) 0.00 - 0.08 ng/mL   Comment NOTIFIED PHYSICIAN    Comment 3            Comment: Due to the release kinetics of cTnI, a negative result within the first hours of the onset of symptoms does not rule out myocardial infarction with certainty. If  myocardial infarction is still suspected, repeat the test at appropriate intervals.   MRSA PCR Screening     Status: None   Collection Time: 04/18/16  8:50  PM  Result Value Ref Range   MRSA by PCR NEGATIVE NEGATIVE    Comment:        The GeneXpert MRSA Assay (FDA approved for NASAL specimens only), is one component of a comprehensive MRSA colonization surveillance program. It is not intended to diagnose MRSA infection nor to guide or monitor treatment for MRSA infections.   TSH     Status: Abnormal   Collection Time: 04/18/16  9:11 PM  Result Value Ref Range   TSH 261.867 (H) 0.350 - 4.500 uIU/mL    Comment: Performed by a 3rd Generation assay with a functional sensitivity of <=0.01 uIU/mL.  Troponin I (q 6hr x 3)     Status: Abnormal   Collection Time: 04/18/16  9:35 PM  Result Value Ref Range   Troponin I 0.07 (HH) <0.03 ng/mL    Comment: CRITICAL RESULT CALLED TO, READ BACK BY AND VERIFIED WITH: S.JONES,RN 2227 04/18/16 [email or web address]   Basic metabolic panel     Status: Abnormal   Collection Time: 04/19/16  4:37 AM  Result Value Ref Range   Sodium 143 135 - 145 mmol/L   Potassium 3.9 3.5 - 5.1 mmol/L    Comment: NO VISIBLE HEMOLYSIS   Chloride 107 101 - 111 mmol/L   CO2 23 22 - 32 mmol/L   Glucose, Bld 153 (H) 65 - 99 mg/dL   BUN 19 6 - 20 mg/dL   Creatinine, Ser 2.00 (H) 0.44 - 1.00 mg/dL   Calcium 10.0 8.9 - 10.3 mg/dL   GFR calc non Af Amer 21 (L) >60 mL/min   GFR calc Af Amer 24 (L) >60 mL/min    Comment: (NOTE) The eGFR has been calculated using the CKD EPI equation. This calculation has not been validated in all clinical situations. eGFR's persistently <60 mL/min signify possible Chronic Kidney Disease.    Anion gap 13 5 - 15  Troponin I (q 6hr x 3)     Status: Abnormal   Collection Time: 04/19/16  4:37 AM  Result Value Ref Range   Troponin I 0.09 (HH) <0.03 ng/mL    Comment: CRITICAL VALUE NOTED.  VALUE IS CONSISTENT WITH PREVIOUSLY REPORTED AND CALLED VALUE.   T4, free     Status: Abnormal   Collection Time: 04/19/16  1:51 PM  Result Value Ref Range   Free T4 0.41 (L) 0.61 - 1.12 ng/dL    Comment: (NOTE) Biotin ingestion may interfere with free T4 tests. If the results are inconsistent with the TSH level, previous test results, or the clinical presentation, then consider biotin interference. If needed, order repeat testing after stopping biotin.   T3, free     Status: Abnormal   Collection Time: 04/19/16  1:51 PM  Result Value Ref Range   T3, Free 0.7 (L) 2.0 - 4.4 pg/mL    Comment: (NOTE) Performed At: [facility] [place], [place] [id number] Lindon Romp MD KG:[id number]   Basic metabolic panel     Status: Abnormal   Collection Time: 04/20/16  4:17 AM  Result Value Ref Range   Sodium 141 135 - 145 mmol/L   Potassium 3.2 (L) 3.5 - 5.1 mmol/L    Comment: DELTA CHECK NOTED   Chloride 108 101 - 111 mmol/L   CO2 22 22 - 32 mmol/L   Glucose, Bld 148 (H) 65 - 99 mg/dL   BUN 20 6 - 20 mg/dL   Creatinine, Ser 2.23 (H) 0.44 - 1.00 mg/dL   Calcium 9.6 8.9 -  10.3 mg/dL   GFR calc non Af Amer 18 (L) >60 mL/min   GFR calc Af Amer 21 (L) >60 mL/min    Comment: (NOTE) The eGFR has been calculated using the CKD EPI equation. This calculation has not been validated in all clinical situations. eGFR's persistently <60 mL/min signify possible Chronic Kidney Disease.    Anion gap 11 5 - 15    Imaging: Dg Chest Portable 1 View  Result Date: 04/18/2016 CLINICAL DATA:  Short breath EXAM: PORTABLE CHEST 1 VIEW COMPARISON:  None. FINDINGS: Stable enlarged cardiac silhouette. There is lower lobe mild airspace disease similar to prior. Small effusions. Upper lungs are clear. IMPRESSION: Cardiomegaly and lower lobe airspace disease suggests congestive heart failure. Electronically Signed   By: Suzy Bouchard M.D.   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 Diuresed more overnight .. I's/O's not accurate, but weight is down 5 lbs. Creatinine acutely rose overnight. Would decrease diuresis today, but continue. Re-establishing thyroid axis. May need nephrology assistance given her severely reduced GFR. Replete potassium today with 40 MEQ po x 1.  Time Spent Directly with Patient:  15 minutes  Length of Stay:  LOS: 2 days   Pixie Casino, MD, [facility] Attending Cardiologist [place] 04/20/2016, 8:38 AM

## 2016-04-20 NOTE — Progress Notes (Addendum)
PROGRESS NOTE    [name]  OYD:[id number] DOB: 1923/04/11 DOA: 04/18/2016 PCP: Georgianne Fick, MD   Brief Narrative: 83 y.o. 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 -Patient with mild clinical improvement and elevation in serum creatinine level, exact output is not recorded. I will reduce the dose of Lasix to 40 mg IV daily. Continue Foley catheter, history of ins and outs. -pending  echocardiogram -Continue hydralazine, Imdur, metoprolol. Also on aspirin. -Patient is clinically better today. No chest pain. Shortness of breath is better. -Cardiology consult appreciated.  #Chronic kidney disease stage IV: Serum creatinine level mildly elevated today likely hemodynamically mediated in the setting of IV diuretics. The dose of Lasix reduced. Monitor BMP. Avoid nephrotoxins.  #Hypokalemia: Repleted potassium chloride. I will check magnesium  #Hypertension: Blood pressure acceptable. Continue home medications. Monitor blood pressure.  #Peripheral vascular disease status post left BKA: Continue aspirin  #Hypothyroidism: Apparently patient was not to  taking Synthroid. TSH was markedly elevated at 261 with low free T3 and T4. Resumed Synthroid 100 g which is patient's prior home dose. She needs follow-up thyroid function test in 4-6 weeks as an outpatient.  #Goals of care discussion: Palliative consult evaluation appreciated. Patient is DNR/DNI at this time. Continue supportive care and medical management.  DVT prophylaxis: Lovenox subcutaneous Code Status: DNR/DNI Family Communication:  No family present at bedside Disposition Plan: Likely discharge home versus rehabilitation in 1-2 days. PT OT ordered.    Consultants:   Cardiology  Procedures: Pending echo Antimicrobials: None  Subjective: Patient was seen and examined at bedside. Denied headache, dizziness. Shortness of breath is improving gradually. Denied chest pain, nausea vomiting or abdominal pain.   Objective: Vitals:   04/20/16 0836 04/20/16 0853 04/20/16 0857 04/20/16 1219  BP: (!) 159/88   132/85  Pulse: 87   82  Resp: 17   20  Temp: 97.5 F (36.4 C)   98.2 F (36.8 C)  TempSrc: Axillary   Axillary  SpO2: 96% 95% 95% 98%  Weight:      Height:        Intake/Output Summary (Last 24 hours) at 04/20/16 1307 Last data filed at 04/20/16 0600  Gross per 24 hour  Intake              150 ml  Output              950 ml  Net             -800 ml   Filed Weights   04/18/16 1605 04/19/16 0344 04/20/16 0352  Weight: 50.3 kg (111 lb) 50.9 kg (112 lb 3.4 oz) 48.7 kg (107 lb 5.8 oz)    Examination:  General exam: Elderly female lying on bed,  not in distress Respiratory system: Clear bilateral, no wheezing appreciated. Cardiovascular system: S1 & S2 heard, RRR.  No pedal edema. Gastrointestinal system: Abdomen is nondistended, soft and nontender. Normal bowel sounds heard. Central nervous system: Alert, awake, following commands. No focal neurological deficit Extremities: Symmetric 5 x 5 power. Skin: No rashes, lesions or ulcers Foley catheter with clear  urine.    Data Reviewed: I have personally reviewed following labs and imaging studies  CBC:  Recent Labs Lab 04/18/16 1619  WBC 6.8  NEUTROABS 3.8  HGB 11.6*  HCT 36.2  MCV 87.7  PLT 343   Basic Metabolic Panel:  Recent Labs Lab 04/18/16 1619 04/19/16 0437 04/20/16 0417  NA 142 143 141  K 5.1 3.9 3.2*  CL 109 107 108  CO2 22 23 22   GLUCOSE 165* 153* 148*  BUN 19 19 20   CREATININE 2.16* 2.00* 2.23*  CALCIUM 9.6 10.0 9.6   GFR: Estimated Creatinine Clearance: 11.8 mL/min (by C-G formula based on SCr of 2.23 mg/dL (H)). Liver Function Tests:  Recent Labs Lab 04/18/16 1619  AST 47*  ALT 21  ALKPHOS 74  BILITOT 1.8*  PROT 7.2  ALBUMIN 3.7   No results for input(s): LIPASE, AMYLASE in the last 168 hours. No results for input(s): AMMONIA in the last 168 hours. Coagulation Profile: No results for input(s): INR, PROTIME in the last 168 hours. Cardiac Enzymes:  Recent Labs Lab 04/18/16 2135 04/19/16 0437  TROPONINI 0.07* 0.09*   BNP (last 3 results) No results for input(s): PROBNP in the last 8760 hours. HbA1C: No results for input(s): HGBA1C in the last 72 hours. CBG: No results for input(s): GLUCAP in the last 168 hours. Lipid Profile: No results for input(s): CHOL, HDL, LDLCALC, TRIG, CHOLHDL, LDLDIRECT in the last 72 hours. Thyroid Function Tests:  Recent Labs  04/18/16 2111 04/19/16 1351  TSH 261.867*  --   FREET4  --  0.41*  T3FREE  --  0.7*   Anemia Panel: No results for input(s): VITAMINB12, FOLATE, FERRITIN, TIBC, IRON, RETICCTPCT in the last 72 hours. Sepsis Labs: No results for input(s): PROCALCITON, LATICACIDVEN in the last 168 hours.  Recent Results (from the past 240 hour(s))  MRSA PCR Screening     Status: None   Collection Time: 04/18/16  8:50 PM  Result Value Ref Range Status   MRSA by PCR NEGATIVE NEGATIVE Final    Comment:        The GeneXpert MRSA Assay (FDA approved for NASAL specimens only), is one component of  a comprehensive MRSA colonization surveillance program. It is not intended to diagnose MRSA infection nor to guide or monitor treatment for MRSA infections.          Radiology Studies: Dg Chest Portable 1 View  Result Date: 04/18/2016 CLINICAL DATA:  Short breath EXAM: PORTABLE CHEST 1 VIEW COMPARISON:  None. FINDINGS: Stable enlarged cardiac silhouette. There is lower lobe mild airspace disease similar to prior. Small effusions. Upper lungs are clear. IMPRESSION: Cardiomegaly and lower lobe airspace disease suggests congestive heart failure. Electronically Signed   By: Genevive Bi M.D.   On: 04/18/2016 16:52        Scheduled Meds: . allopurinol  200 mg Oral Daily  . aspirin EC  325 mg Oral Daily  . calcitRIOL  0.25 mcg Oral QODAY  . enoxaparin (LOVENOX) injection  30 mg Subcutaneous Q24H  . famotidine  20 mg Oral QHS  . feeding supplement  1 Container Oral TID BM  . Melene Muller  ON 04/21/2016] furosemide  40 mg Intravenous Daily  . hydrALAZINE  50 mg Oral BID  . isosorbide mononitrate  60 mg Oral Daily  . levothyroxine  100 mcg Oral QAC breakfast  . metoprolol tartrate  12.5 mg Oral BID  . nystatin  5 mL Mouth/Throat QID  . potassium chloride  40 mEq Oral Daily  . pravastatin  40 mg Oral QPM  . sevelamer carbonate  800 mg Oral TID WC  . sodium chloride flush  3 mL Intravenous Q12H  . Vitamin D (Ergocalciferol)  50,000 Units Oral Once per day on Wed Fri   Continuous Infusions:   LOS: 2 days    Dron Jaynie Collins, MD [facility] Pager [phone]  If 7PM-7AM, please contact night-coverage [email or web address] Password [email or web address] 04/20/2016, 1:07 PM

## 2016-04-20 NOTE — Evaluation (Signed)
Physical Therapy Evaluation Patient Details Name: [name] MRN: [id number] DOB: Dec 23, 1923 Today's Date: 04/20/2016   History of Present Illness  Patient is a 83 yo female admitted 04/18/16 with severe hypothyroidism.   PMH:  CHF, Lt BKA, CVA, dementia, PVD, CKD, HTN, CAD, DM  Clinical Impression  Patient is functioning at her baseline level.  Patient is bed/wheelchair bound.  Son and 2 daughters provide 24 hour care, including total assist for mobility and ADL's. Son reports they have all necessary equipment at home.  No f/u PT is indicated - PT will sign off.    Follow Up Recommendations No PT follow up;Supervision/Assistance - 24 hour    Equipment Recommendations  None recommended by PT (Son reports they have all needed equipment at home.)    Recommendations for Other Services       Precautions / Restrictions Precautions Precautions: Fall Precaution Comments: Not ambulatory.  Bed/wheelchair bound. Restrictions Weight Bearing Restrictions: No      Mobility  Bed Mobility Overal bed mobility: Needs Assistance Bed Mobility: Rolling Rolling: Total assist         General bed mobility comments: Verbal and tactile cues for patient to roll from Lt side to Rt side.  Patient unable to follow commands, even with tactile cues, to initiate rolling.  Physical assist to initiate movement, and patient unable to complete rolling.  Required total assist to roll.  Son reports he moves patient at home for ADL's.  Transfers                 General transfer comment: NT - Son states he lifts patient from bed to w/c to [facility].  Reports patient unable to bear weight on RLE at this time.  Ambulation/Gait                Stairs            Wheelchair Mobility    Modified Rankin (Stroke Patients Only)       Balance                                             Pertinent Vitals/Pain Pain Assessment: No/denies pain    Home Living Family/patient  expects to be discharged to:: Private residence Living Arrangements: Children Available Help at Discharge: Family;Available 24 hours/day Type of Home: House Home Access: Ramped entrance     Home Layout: One level Home Equipment: Walker - 2 wheels;Bedside commode;Tub bench;Hospital bed;Wheelchair - IT trainer;Shower seat      Prior Function Level of Independence: Needs assistance   Gait / Transfers Assistance Needed: Son reports he lifts patient into w/c and onto [facility]  ADL's / Homemaking Assistance Needed: Son provides total ADL's except patient can feed herself at times        Hand Dominance   Dominant Hand: Right    Extremity/Trunk Assessment   Upper Extremity Assessment Upper Extremity Assessment: Generalized weakness    Lower Extremity Assessment Lower Extremity Assessment: RLE deficits/detail;LLE deficits/detail RLE Deficits / Details: Decreased strength, grossly 1/5 - 2/5. LLE Deficits / Details: Lt BKA; decreased strength    Cervical / Trunk Assessment Cervical / Trunk Assessment: Kyphotic  Communication   Communication: No difficulties  Cognition Arousal/Alertness: Awake/alert Behavior During Therapy: Flat affect Overall Cognitive Status: History of cognitive impairments - at baseline  General Comments      Exercises     Assessment/Plan    PT Assessment Patent does not need any further PT services  PT Problem List            PT Treatment Interventions      PT Goals (Current goals can be found in the Care Plan section)  Acute Rehab PT Goals PT Goal Formulation: All assessment and education complete, DC therapy    Frequency     Barriers to discharge        Co-evaluation               End of Session   Activity Tolerance: Patient limited by fatigue Patient left: in bed;with call bell/phone within reach;with bed alarm set;with family/visitor present           Time: 4098-1191 PT Time  Calculation (min) (ACUTE ONLY): 10 min   Charges:   PT Evaluation $PT Eval Moderate Complexity: 1 Procedure     PT G Codes:        Vena Austria 05/04/16, 6:18 PM Durenda Hurt. Renaldo Fiddler, [facility] Acute Rehab Services Pager [phone]

## 2016-04-20 NOTE — Consult Note (Signed)
Consultation Note Date: 04/20/2016   Patient Name: [name]  DOB: [birth year]  MRN: [id number]  Age / Sex: 83 y.o., female  PCP: Shannon Seashore, MD Referring Physician: Rosita Fire, MD  Reason for Consultation: Establishing goals of care and Psychosocial/spiritual support  HPI/Patient Profile: 83 y.o. female  with past medical history of hypothyroidism, hypertension, chronic kidney disease stage IV, congestive heart failure with EF of 30%-35% , peripheral vascular disease status post left BKA, and prior stroke who presented to the ED with progressive dyspnea and increasing lower extremity edema and was admitted on 04/18/2016 with acute systolic CHF. She had been vomiting a few days before admission and had not been able to get her normal medications. On admission she was also noted to have TSH 261, T4 0.41, and had been reportedly not been taking Levothyroxine. At baseline pt is predominantly in bed or in a wheelchair, she lives with her son who manages her medication.   Clinical Assessment and Goals of Care: [name] is conversational, calm, and pleasant but clearly confused. Her son notes that she has intermittent confusion since her stroke, which is worsened outside of her home. She is unable to meaningfully participate in conversations about goals of care. I met with her son [name], and her two daughters [name] and [name]. Despite notes in chart about [name] being [name], they do not have any paperwork for that. They do discuss things collaboratively and and make decisions together.   I had a long discussion with them about heart failure and the normal trajectory with progressive decline and increased symptom burden. I also explained the role of the thyroid in the body, and the relationship between severe hypothyroidism and her symptoms. They were very appreciative of this information and did not realize the progressive and incurable  nature of her heart failure. We then had a long goals of care conversation, and her family felt comfort was a priority, but they still wanted full scope of care for symptom management up to her death. To that end, we discussed code status (which had been brought up by palliative care in earlier admissions), and they felt that a DNR was appropriate. I clarified that meant no CPR, defibrillation, and intubation, which they agreed with. In the event that her heart or lungs stopped, they would want her to be kept comfortable.   Primary Decision Maker HCPOA; [name] (daughter)  SUMMARY OF RECOMMENDATIONS    DNR; continue to treat what is treatable  Plan for discharge back home with [facility] and Palliative Care; [name] helps with all of her ADLs and manages her medications  Will consult Pharmacist to discuss med administration with [name] closer to discharge (what can be crushed or dissolved in water, how to take Synthroid, etc.)  Code Status/Advance Care Planning:  DNR  Symptom Management:   Pt has no symptom complaints with no obvious signs of distress.  Nystatin noted on tongue, will start Nystatin  Additional Recommendations (Limitations, Scope, Preferences):  Continue to treat what is treatable. DNR.   Psycho-social/Spiritual:   Desire for further Chaplaincy support:no  Prognosis:   Unable to determine  Discharge Planning: Home with Home Health and Palliative Care.     Primary Diagnoses: Present on Admission: **None**   I have reviewed the medical record, interviewed the patient and family, and examined the patient. The following aspects are pertinent.  Past Medical History:  Diagnosis Date  . Abnormal nuclear stress test 06/01/09   Demonstrated a new area of infarct scar,  peri-infarct ischemia seen in the inferolateral territory. EF eas normal at 70% with mild hypocontractility at the apex, distal inferolateral wall.  . Anemia   . Arthritis   . Cardiomyopathy, idiopathic  ([place]) 02/08/2011  . Chronic diastolic CHF (congestive heart failure) (HCC)    Takes Lasix  . Chronic kidney disease (CKD), stage IV (severe) ([place])   . Coronary artery disease    a. Cath 09/2010 - med rx.  . Diabetes mellitus    type 2 NIDDM  . Dyslipidemia   . GERD (gastroesophageal reflux disease)   . Gout    takes allopurinol  . H/O echocardiogram 09/06/11   Indication- nonIschemic Cardiomyopathy. EF = now greater than 55% with no regional wall motion abnormalities. Tthere is mild to moderate trisuspid regurgitayion and mild pulmonary hypertension with an RVSP of 35 mmHg as well as stage 1 diastolic dysfunction and mild to moderate LVH.  Marland Kitchen Hx of transient ischemic attack (TIA)   . Hypertension   . Hypothyroidism    (SEVERE) Takes Levothryroxine  . Irregular heartbeat   . Memory loss   . Myocardial infarct    x 3 unsure of years  . Nonischemic cardiomyopathy (HCC)    EF now is 55%, reduced due to myxedema, which is improved.  . Peripheral neuropathy ([place])   . Peripheral vascular disease ([place])    a. s/p L BKA.  . Shingles   . Stroke ([place])   . TIA (transient ischemic attack)   . Ulcer [facility])    Social History   Social History  . Marital status: Widowed    Spouse name: N/A  . Number of children: 4  . Years of education: N/A   Occupational History  . homemaker    Social History Main Topics  . Smoking status: Former Smoker    Packs/day: 0.50    Years: 40.00    Quit date: 07/05/1986  . Smokeless tobacco: Former Systems developer     Comment: quit smoking 1988  . Alcohol use No  . Drug use: No  . Sexual activity: No   Other Topics Concern  . None   Social History Narrative  . None   Family History  Problem Relation Age of Onset  . Heart attack Daughter   . Heart disease Daughter     Before age 26  . Cancer Sister     STOMACH  . Diabetes Sister   . Cancer Brother     BONE  . Diabetes Brother   . Hyperlipidemia Daughter   . Hypertension Daughter   . Heart disease  Daughter     before age 20  . Kidney disease Daughter   . Other Daughter     varicose veins  . Diabetes Daughter   . Heart disease Son     before age 19  . Hyperlipidemia Son   . Hypertension Son   . Heart attack Son   . Anesthesia problems Neg Hx   . Hypotension Neg Hx   . Malignant hyperthermia Neg Hx   . Pseudochol deficiency Neg Hx    Scheduled Meds: . allopurinol  200 mg Oral Daily  . aspirin EC  325 mg Oral Daily  . calcitRIOL  0.25 mcg Oral QODAY  . enoxaparin (LOVENOX) injection  30 mg Subcutaneous Q24H  . famotidine  20 mg Oral QHS  . feeding supplement  1 Container Oral TID BM  . [START ON 04/21/2016] furosemide  60 mg Intravenous Daily  . hydrALAZINE  50 mg Oral BID  .  ipratropium-albuterol  3 mL Nebulization BID  . isosorbide mononitrate  60 mg Oral Daily  . levothyroxine  100 mcg Oral QAC breakfast  . metoprolol tartrate  12.5 mg Oral BID  . potassium chloride  40 mEq Oral Daily  . pravastatin  40 mg Oral QPM  . sevelamer carbonate  800 mg Oral TID WC  . sodium chloride flush  3 mL Intravenous Q12H  . Vitamin D (Ergocalciferol)  50,000 Units Oral Once per day on Wed Fri   Continuous Infusions: PRN Meds:.sodium chloride, acetaminophen, albuterol, docusate sodium, nitroGLYCERIN, ondansetron (ZOFRAN) IV, sodium chloride flush Allergies  Allergen Reactions  . Aspirin Nausea And Vomiting and Other (See Comments)    325 mg (adult strength) [name]    Review of Systems  Constitutional: Positive for fatigue.  HENT: Negative for hearing loss, sore throat and trouble swallowing.   Eyes: Negative for visual disturbance.  Respiratory: Negative for cough and shortness of breath.   Cardiovascular: Negative for leg swelling.  Gastrointestinal: Negative for abdominal pain and nausea.  Musculoskeletal: Negative for back pain.  Neurological: Positive for weakness. Negative for dizziness.  Psychiatric/Behavioral:  Positive for confusion. Negative for hallucinations. The patient is not nervous/anxious.    **Pt a poor historian related to confusion. She did consistently answer the above ROS questions.  Physical Exam  Constitutional: She has a sickly appearance.  HENT:  Head: Normocephalic and atraumatic.  Mouth/Throat: Mucous membranes are dry. Oropharyngeal exudate (thrush noted on tongue) present.  Cardiovascular: Tachycardia present.   Pulmonary/Chest: No accessory muscle usage. Tachypnea noted. No respiratory distress. She has no wheezes.  Abdominal: Soft. Bowel sounds are normal.  Musculoskeletal: She exhibits edema (race edema in lower extremity).  Left BKA  Neurological: She is alert.  Oriented to self only. Calm, pleasant, and conversational. Does follow simple commands.  Skin: Skin is warm and dry.  Psychiatric: She has a normal mood and affect. Her speech is normal and behavior is normal. Cognition and memory are impaired. She exhibits abnormal recent memory and abnormal remote memory.    Vital Signs: BP (!) 150/72   Pulse 81   Temp 97.7 F (36.5 C) (Oral)   Resp (!) 24   Ht 5' (1.524 m)   Wt 48.7 kg (107 lb 5.8 oz)   SpO2 96%   BMI 20.97 kg/m  Pain Assessment: No/denies pain   Pain Score: 0-No pain   SpO2: SpO2: 96 % O2 Device:SpO2: 96 % O2 Flow Rate: .   IO: Intake/output summary:  Intake/Output Summary (Last 24 hours) at 04/20/16 0807 Last data filed at 04/20/16 0600  Gross per 24 hour  Intake              370 ml  Output              950 ml  Net             -580 ml    LBM:   Baseline Weight: Weight: 50.3 kg (111 lb) Most recent weight: Weight: 48.7 kg (107 lb 5.8 oz)     Palliative Assessment/Data: PPS 40-50%    Time In: 0950 Time Out: 1120 Time Total: 120 minutes Greater than 50%  of this time was spent counseling and coordinating care related to the above assessment and plan.  Signed by: Charlynn Court, NP Palliative Medicine Team Pager # [phone]  (M-F 7a-5p) Team Phone # [phone] (Nights/Weekends)

## 2016-04-20 NOTE — Progress Notes (Signed)
OT Cancellation Note  Patient Details Name: [name] MRN: [id number] DOB: 07-14-1923   Cancelled Treatment:    Reason Eval/Treat Not Completed: OT screened, no needs identified, will sign off. Pt with baseline dependence in ADL and mobility per son. No OT needs. All equipment needs are met in the home.  Malka So 04/20/2016, 1:59 PM  [phone]

## 2016-04-20 NOTE — Progress Notes (Signed)
  Echocardiogram 2D Echocardiogram has been performed.  [name] 04/20/2016, 3:30 PM

## 2016-04-21 DIAGNOSIS — Z515 Encounter for palliative care: Secondary | ICD-10-CM

## 2016-04-21 DIAGNOSIS — I5043 Acute on chronic combined systolic (congestive) and diastolic (congestive) heart failure: Secondary | ICD-10-CM

## 2016-04-21 DIAGNOSIS — N179 Acute kidney failure, unspecified: Secondary | ICD-10-CM

## 2016-04-21 LAB — BASIC METABOLIC PANEL
Anion gap: 14 (ref 5–15)
BUN: 22 mg/dL — AB (ref 6–20)
CO2: 23 mmol/L (ref 22–32)
CREATININE: 2.62 mg/dL — AB (ref 0.44–1.00)
Calcium: 9.8 mg/dL (ref 8.9–10.3)
Chloride: 106 mmol/L (ref 101–111)
GFR calc Af Amer: 17 mL/min — ABNORMAL LOW (ref >60)
GFR, EST NON AFRICAN AMERICAN: 15 mL/min — AB (ref >60)
GLUCOSE: 137 mg/dL — AB (ref 65–99)
POTASSIUM: 3.5 mmol/L (ref 3.5–5.1)
SODIUM: 143 mmol/L (ref 135–145)

## 2016-04-21 LAB — MAGNESIUM: Magnesium: 1.8 mg/dL (ref 1.7–2.4)

## 2016-04-21 MED ORDER — FUROSEMIDE 10 MG/ML IJ SOLN
60.0000 mg | Freq: Every day | INTRAMUSCULAR | Status: DC
  Administered 2016-04-21: 60 mg via INTRAVENOUS
  Filled 2016-04-21: qty 6

## 2016-04-21 MED ORDER — ASPIRIN 325 MG PO TABS
325.0000 mg | ORAL_TABLET | Freq: Every day | ORAL | Status: DC
  Administered 2016-04-22 – 2016-04-23 (×2): 325 mg via ORAL
  Filled 2016-04-21 (×2): qty 1

## 2016-04-21 NOTE — Progress Notes (Signed)
PROGRESS NOTE    [name]  ZOX:[id number] DOB: 1923-08-05 DOA: 04/18/2016 PCP: Georgianne Fick, MD   Brief Narrative: 83 y.o. 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 -Patient with mild clinical improvement with IV diuretics. The urine output was not recorded currently in the beginning. Currently patient has Foley catheter. Serum creatinine level trended up to 2.6 today. Reduced Lasix to 60  IV daily. Cardiology consult appreciated. -echocardiogram showed EF of 20-25% with diffuse hypokinesis. Patient also with a grade 2 diastolic dysfunction. -Continue hydralazine, Imdur, metoprolol. Also on aspirin.  #Chronic kidney disease stage IV: Serum creatinine level fluctuating likely hemodynamically mediated in the setting of IV diuretics. Cardiology note reviewed., Patient is not oliguric and has Foley catheter. Nephrology consult requested and discussed with Dr. Darrick Penna.  -Monitor BMP. Avoid nephrotoxins.  #Hypokalemia: Repleted potassium chloride. Potassium level 3.5 today.  #Hypertension: Blood pressure acceptable. Continue home  medications. Monitor blood pressure.  #Peripheral vascular disease status post left BKA: Continue aspirin  #Hypothyroidism: Apparently patient was not to taking Synthroid. TSH was markedly elevated at 261 with low free T3 and T4. Resumed Synthroid 100 g which is patient's prior home dose. She needs follow-up thyroid function test in 4-6 weeks as an outpatient.  #Goals of care discussion: Palliative consult evaluation appreciated. Patient is DNR/DNI at this time. Continue supportive care and medical management.  DVT prophylaxis: Lovenox subcutaneous Code Status: DNR/DNI Family Communication:  No family present at bedside Disposition Plan: Likely discharge home versus rehabilitation in 1-2 days. PT OT ordered.    Consultants:   Cardiology  Nephrologist  Procedures: echo Antimicrobials: None  Subjective: Patient was seen and examined at bedside. No new event. Reported feeling better. Denied shortness of breath, chest pain, nausea or vomiting.   Objective: Vitals:   04/21/16 0302 04/21/16 0400 04/21/16 0617 04/21/16 0739  BP:  (!) 164/74 (!) 157/87 (!) 143/81  Pulse:  83  83  Resp:  (!) 23 13 (!) 21  Temp:  97.3 F (36.3 C)  97.4 F (36.3 C)  TempSrc:  Axillary  Oral  SpO2:  100%  100%  Weight: 48.5 kg (106 lb 14.8 oz)     Height:        Intake/Output Summary (Last 24 hours) at 04/21/16 1021 Last data filed at 04/21/16 0600  Gross per 24 hour  Intake                0 ml  Output              800 ml  Net             -800 ml   Filed Weights   04/19/16 0344 04/20/16 0352 04/21/16 0302  Weight:  50.9 kg (112 lb 3.4 oz) 48.7 kg (107 lb 5.8 oz) 48.5 kg (106 lb 14.8 oz)    Examination:  General exam: Elderly female lying on bed, not in distress Respiratory system: Clear bilaterally, no wheezing or crackle. Cardiovascular system: Regular rate and rhythm, S1-S2 normal. No pedal edema. Gastrointestinal system: Abdomen is nondistended, soft and nontender. Normal bowel  sounds heard. Central nervous system: Alert, awake, following commands. No focal neurological deficit Extremities: Symmetric 5 x 5 power. Skin: No rashes, lesions or ulcers Foley catheter with clear urine.    Data Reviewed: I have personally reviewed following labs and imaging studies  CBC:  Recent Labs Lab 04/18/16 1619  WBC 6.8  NEUTROABS 3.8  HGB 11.6*  HCT 36.2  MCV 87.7  PLT 343   Basic Metabolic Panel:  Recent Labs Lab 04/18/16 1619 04/19/16 0437 04/20/16 0417 04/21/16 0249  NA 142 143 141 143  K 5.1 3.9 3.2* 3.5  CL 109 107 108 106  CO2 22 23 22 23   GLUCOSE 165* 153* 148* 137*  BUN 19 19 20  22*  CREATININE 2.16* 2.00* 2.23* 2.62*  CALCIUM 9.6 10.0 9.6 9.8  MG  --   --   --  1.8   GFR: Estimated Creatinine Clearance: 10 mL/min (by C-G formula based on SCr of 2.62 mg/dL (H)). Liver Function Tests:  Recent Labs Lab 04/18/16 1619  AST 47*  ALT 21  ALKPHOS 74  BILITOT 1.8*  PROT 7.2  ALBUMIN 3.7   No results for input(s): LIPASE, AMYLASE in the last 168 hours. No results for input(s): AMMONIA in the last 168 hours. Coagulation Profile: No results for input(s): INR, PROTIME in the last 168 hours. Cardiac Enzymes:  Recent Labs Lab 04/18/16 2135 04/19/16 0437  TROPONINI 0.07* 0.09*   BNP (last 3 results) No results for input(s): PROBNP in the last 8760 hours. HbA1C: No results for input(s): HGBA1C in the last 72 hours. CBG: No results for input(s): GLUCAP in the last 168 hours. Lipid Profile: No results for input(s): CHOL, HDL, LDLCALC, TRIG, CHOLHDL, LDLDIRECT in the last 72 hours. Thyroid Function Tests:  Recent Labs  04/18/16 2111 04/19/16 1351  TSH 261.867*  --   FREET4  --  0.41*  T3FREE  --  0.7*   Anemia Panel: No results for input(s): VITAMINB12, FOLATE, FERRITIN, TIBC, IRON, RETICCTPCT in the last 72 hours. Sepsis Labs: No results for input(s): PROCALCITON, LATICACIDVEN in the last 168 hours.  Recent Results (from the  past 240 hour(s))  MRSA PCR Screening     Status: None   Collection Time: 04/18/16  8:50 PM  Result Value Ref Range Status   MRSA by PCR NEGATIVE NEGATIVE Final    Comment:        The GeneXpert MRSA Assay (FDA approved for NASAL specimens only), is one component of a comprehensive MRSA colonization surveillance program. It is not intended to diagnose MRSA infection nor to guide or monitor treatment for MRSA infections.          Radiology Studies: No results found.      Scheduled Meds: . allopurinol  200 mg Oral Daily  . aspirin EC  325 mg Oral Daily  . calcitRIOL  0.25 mcg Oral QODAY  . enoxaparin (LOVENOX) injection  30 mg Subcutaneous Q24H  . famotidine  20 mg Oral QHS  . feeding supplement  1 Container Oral TID BM  . hydrALAZINE  50 mg Oral BID  . isosorbide mononitrate  60 mg Oral Daily  .  levothyroxine  100 mcg Oral QAC breakfast  . metoprolol tartrate  12.5 mg Oral BID  . nystatin  5 mL Mouth/Throat QID  . potassium chloride  40 mEq Oral Daily  . pravastatin  40 mg Oral QPM  . sevelamer carbonate  800 mg Oral TID WC  . sodium chloride flush  3 mL Intravenous Q12H  . Vitamin D (Ergocalciferol)  50,000 Units Oral Once per day on Wed Fri   Continuous Infusions:   LOS: 3 days    Dron Jaynie Collins, MD [facility] Pager [phone]  If 7PM-7AM, please contact night-coverage [email or web address] Password [email or web address] 04/21/2016, 10:21 AM

## 2016-04-21 NOTE — Consult Note (Signed)
Referring Provider: No ref. provider found Primary Care Physician:  Georgianne Fick, MD Primary Nephrologist:  Dr. Darrick Penna   Reason for Consultation:  Acute kidney Injury   Chronic renal disease stage 4   HPI: 83 y.o.femalewith medical history significant of hypertension, chronic kidney disease stage IV, congestive heart failure with EF of 30%-35 % , peripheral vascular disease status post left BKA, prior stroke presented from home with worsening shortness of breath, dyspnea on exertion for worsening for 2 days.  Patient baseline creatinine about 2 secondary to nephrosclerosi. Was admitted with congestive heart failure and diuresis of about 3 Kg and a slight increase in the serum creatinine in setting of IV diuresis to 2.6  She is not a candidate for dialysis and hospice have evaluated the patient . No use of non steroidal antiinflammatory drugs   Past Medical History:  Diagnosis Date  . Abnormal nuclear stress test 06/01/09   Demonstrated a new area of infarct scar, peri-infarct ischemia seen in the inferolateral territory. EF eas normal at 70% with mild hypocontractility at the apex, distal inferolateral wall.  . Anemia   . Arthritis   . Cardiomyopathy, idiopathic (HCC) 02/08/2011  . Chronic diastolic CHF (congestive heart failure) (HCC)    Takes Lasix  . Chronic kidney disease (CKD), stage IV (severe) (HCC)   . Coronary artery disease    a. Cath 09/2010 - med rx.  . Diabetes mellitus    type 2 NIDDM  . Dyslipidemia   . GERD (gastroesophageal reflux disease)   . Gout    takes allopurinol  . H/O echocardiogram 09/06/11   Indication- nonIschemic Cardiomyopathy. EF = now greater than 55% with no regional wall motion abnormalities. Tthere is mild to moderate trisuspid regurgitayion and mild pulmonary hypertension with an RVSP of 35 mmHg as well as stage 1 diastolic dysfunction and mild to moderate LVH.  Marland Kitchen Hx of transient ischemic attack (TIA)   . Hypertension   . Hypothyroidism     (SEVERE) Takes Levothryroxine  . Irregular heartbeat   . Memory loss   . Myocardial infarct    x 3 unsure of years  . Nonischemic cardiomyopathy (HCC)    EF now is 55%, reduced due to myxedema, which is improved.  . Peripheral neuropathy (HCC)   . Peripheral vascular disease (HCC)    a. s/p L BKA.  . Shingles   . Stroke (HCC)   . TIA (transient ischemic attack)   . Ulcer [facility])     Past Surgical History:  Procedure Laterality Date  . AMPUTATION  07/05/2011   Procedure: AMPUTATION BELOW KNEE;  Surgeon: Chuck Hint, MD;  Location: [facility] OR;  Service: Vascular;  Laterality: Left;  . ANGIOPLASTY  1988  . AV FISTULA PLACEMENT    . AV FISTULA PLACEMENT Right 12/24/2013   Procedure: INSERTION OF ARTERIOVENOUS (AV) GORE-TEX GRAFT ARM;  Surgeon: Chuck Hint, MD;  Location: [facility] OR;  Service: Vascular;  Laterality: Right;  . BACK SURGERY     [facility]  . CARDIAC CATHETERIZATION  09/28/07   Demonstrated multiple sequential lesions around 40 to 30% in the RCA territory.  . Duplex doppler  05/10/11   LE arterial dopplers demonstrate bilaterally reduced ABIs of 0.91 on right & 0.56 on left. She does report some decreased pain on the left, & there's moderate mixed-density plaque in the right SFA w/50 to 69% reduction. There's a 69% reduction in the left SFA & does appear to be occlusive disease of left posterior tibial artery.  Right posterior dorsalis pedis artery demonstrates occlusive disease  . ESOPHAGOGASTRODUODENOSCOPY N/A 04/15/2014   Procedure: ESOPHAGOGASTRODUODENOSCOPY (EGD);  Surgeon: Rachael Fee, MD;  Location: [facility] ENDOSCOPY;  Service: Endoscopy;  Laterality: N/A;  . EYE SURGERY     Left eye surgery; cataract removal  . INSERTION OF DIALYSIS CATHETER N/A 01/06/2013   Procedure: INSERTION OF DIALYSIS CATHETER;  Surgeon: Larina Earthly, MD;  Location: [facility] OR;  Service: Vascular;  Laterality: N/A;  . left bka  07/05/2011  . left lower extremity venous duplex Left  06/27/11   Summary: No evidence of DVT involving the left lower extremity and right common femoral vein.   Marland Kitchen LIGATION ARTERIOVENOUS GORTEX GRAFT Right 03/25/2014   Procedure: LIGATION ARTERIOVENOUS GORTEX GRAFT;  Surgeon: Chuck Hint, MD;  Location: [facility] OR;  Service: Vascular;  Laterality: Right;  . LOWER EXTREMITY ANGIOGRAM N/A 10/31/2011   Procedure: LOWER EXTREMITY ANGIOGRAM;  Surgeon: Chuck Hint, MD;  Location: [facility] CATH LAB;  Service: Cardiovascular;  Laterality: N/A;  . Lower extremity arterial evaluation  06/27/11   SUMMARY: Right: ABI not ascertained due to false elevation in BP secondary to calcification (posterior tibial artery is non compressible). Left: ABI indicates moderate reduction in arterial flow. Bilateral: Great toe PPG waveforms indicate adequate perfusion. Great toe pressures not obtained due to patient's movements secondary to pain.  Marland Kitchen SHUNTOGRAM N/A 03/17/2014   Procedure: Betsey Amen;  Surgeon: Fransisco Hertz, MD;  Location: [facility] CATH LAB;  Service: Cardiovascular;  Laterality: N/A;    Prior to Admission medications   Medication Sig Start Date End Date Taking? Authorizing Provider  acetaminophen (TYLENOL) 325 MG tablet Take 650 mg by mouth every 6 (six) hours as needed for mild pain.   Yes Historical Provider, MD  albuterol-ipratropium (COMBIVENT) 18-103 MCG/ACT inhaler Inhale 1-2 puffs into the lungs at bedtime as needed for wheezing or shortness of breath.    Yes Historical Provider, MD  allopurinol (ZYLOPRIM) 100 MG tablet Take 100 mg by mouth daily.    Yes Historical Provider, MD  aspirin 325 MG tablet Take 1 tablet (325 mg total) by mouth daily. 01/04/16  Yes Alison Murray, MD  calcitRIOL (ROCALTROL) 0.25 MCG capsule Take 0.25 mcg by mouth every other day.  11/04/12  Yes Historical Provider, MD  Calcium Carbonate-Vitamin D (CALCIUM-VITAMIN D) 500-200 MG-UNIT tablet Take 1 tablet by mouth daily.   Yes Historical Provider, MD  Cholecalciferol (VITAMIN D PO) Take 1  capsule by mouth daily.   Yes Historical Provider, MD  docusate sodium (COLACE) 100 MG capsule Take 100 mg by mouth daily as needed for mild constipation.   Yes Historical Provider, MD  famotidine (PEPCID) 20 MG tablet Take 1 tablet (20 mg total) by mouth at bedtime. Patient taking differently: Take 20 mg by mouth at bedtime as needed for heartburn.  11/13/15  Yes Rodolph Bong, MD  furosemide (LASIX) 20 MG tablet Take 1 tablet (20 mg total) by mouth daily. Patient taking differently: Take 80 mg by mouth daily.  01/20/16  Yes Filbert Schilder, MD  gabapentin (NEURONTIN) 300 MG capsule Take 300 mg by mouth 3 (three) times daily.    Yes Historical Provider, MD  hydrALAZINE (APRESOLINE) 50 MG tablet Take 1 tablet (50 mg total) by mouth 2 (two) times daily. Patient taking differently: Take 50 mg by mouth 3 (three) times daily.  10/22/15  Yes Chrystie Nose, MD  isosorbide mononitrate (IMDUR) 60 MG 24 hr tablet Take 60 mg by mouth daily.  Yes Historical Provider, MD  levothyroxine (SYNTHROID, LEVOTHROID) 100 MCG tablet Take 100 mcg by mouth daily. 04/09/16  Yes Historical Provider, MD  metoCLOPramide (REGLAN) 5 MG tablet Take 1 tablet (5 mg total) by mouth every 6 (six) hours as needed for nausea. 01/04/16  Yes Alison Murray, MD  pravastatin (PRAVACHOL) 40 MG tablet Take 40 mg by mouth every evening.    Yes Historical Provider, MD  RENVELA 800 MG tablet Take 800 mg by mouth 3 (three) times daily with meals.  07/04/13  Yes Historical Provider, MD  feeding supplement (BOOST / RESOURCE BREEZE) LIQD Take 1 Container by mouth 3 (three) times daily between meals. Patient not taking: Reported on 04/19/2016 11/13/15   Rodolph Bong, MD  sodium bicarbonate 650 MG tablet Take 1 tablet (650 mg total) by mouth daily. Patient not taking: Reported on 04/19/2016 11/13/15   Rodolph Bong, MD    Current Facility-Administered Medications  Medication Dose Route Frequency Provider Last Rate Last Dose  . 0.9 %   sodium chloride infusion  250 mL Intravenous PRN Dron Jaynie Collins, MD      . acetaminophen (TYLENOL) tablet 650 mg  650 mg Oral Q6H PRN Dron Jaynie Collins, MD      . albuterol (PROVENTIL) (2.5 MG/3ML) 0.083% nebulizer solution 2.5 mg  2.5 mg Nebulization Q4H PRN Georgianne Fick, MD      . allopurinol (ZYLOPRIM) tablet 200 mg  200 mg Oral Daily Dron Jaynie Collins, MD   200 mg at 04/21/16 0856  . [START ON 04/22/2016] aspirin tablet 325 mg  325 mg Oral Daily Ranae Palms, NP      . calcitRIOL (ROCALTROL) capsule 0.25 mcg  0.25 mcg Oral QODAY Dron Jaynie Collins, MD   0.25 mcg at 04/21/16 0857  . docusate sodium (COLACE) capsule 100 mg  100 mg Oral Daily PRN Dron Jaynie Collins, MD      . enoxaparin (LOVENOX) injection 30 mg  30 mg Subcutaneous Q24H Dron Jaynie Collins, MD   30 mg at 04/20/16 2108  . famotidine (PEPCID) tablet 20 mg  20 mg Oral QHS Dron Jaynie Collins, MD   20 mg at 04/20/16 2108  . feeding supplement (BOOST / RESOURCE BREEZE) liquid 1 Container  1 Container Oral TID BM Dron Jaynie Collins, MD   1 Container at 04/21/16 1000  . furosemide (LASIX) injection 60 mg  60 mg Intravenous Daily Dron Jaynie Collins, MD   60 mg at 04/21/16 1239  . hydrALAZINE (APRESOLINE) tablet 50 mg  50 mg Oral BID Dron Jaynie Collins, MD   50 mg at 04/21/16 0856  . ipratropium-albuterol (DUONEB) 0.5-2.5 (3) MG/3ML nebulizer solution 3 mL  3 mL Nebulization Q6H PRN Dron Jaynie Collins, MD      . isosorbide mononitrate (IMDUR) 24 hr tablet 60 mg  60 mg Oral Daily Dron Jaynie Collins, MD   60 mg at 04/21/16 0856  . levothyroxine (SYNTHROID, LEVOTHROID) tablet 100 mcg  100 mcg Oral QAC breakfast Dron Jaynie Collins, MD   100 mcg at 04/21/16 0857  . metoprolol tartrate (LOPRESSOR) tablet 12.5 mg  12.5 mg Oral BID Dron Jaynie Collins, MD   12.5 mg at 04/21/16 0856  . nitroGLYCERIN (NITROSTAT) SL tablet 0.4 mg  0.4 mg Sublingual Q5 min PRN Alvira Monday, MD   0.4 mg at 04/19/16 0109  .  nystatin (MYCOSTATIN) 100000 UNIT/ML suspension 500,000 Units  5 mL Mouth/Throat QID Ranae Palms, NP   500,000 Units at 04/21/16 0857  .  ondansetron (ZOFRAN) injection 4 mg  4 mg Intravenous Q6H PRN Dron Jaynie Collins, MD      . potassium chloride (KLOR-CON) packet 40 mEq  40 mEq Oral Daily Dron Jaynie Collins, MD   40 mEq at 04/21/16 0857  . pravastatin (PRAVACHOL) tablet 40 mg  40 mg Oral QPM Dron Jaynie Collins, MD   40 mg at 04/20/16 1713  . sevelamer carbonate (RENVELA) tablet 800 mg  800 mg Oral TID WC Dron Jaynie Collins, MD   800 mg at 04/21/16 0856  . sodium chloride flush (NS) 0.9 % injection 3 mL  3 mL Intravenous Q12H Dron Jaynie Collins, MD   3 mL at 04/21/16 0858  . sodium chloride flush (NS) 0.9 % injection 3 mL  3 mL Intravenous PRN Dron Jaynie Collins, MD      . Vitamin D (Ergocalciferol) (DRISDOL) capsule 50,000 Units  50,000 Units Oral Once per day on Wed Fri Dron Jaynie Collins, MD   50,000 Units at 04/20/16 1000    Allergies as of 04/18/2016 - Review Complete 04/18/2016  Allergen Reaction Noted  . Aspirin Nausea And Vomiting 02/07/2011    Family History  Problem Relation Age of Onset  . Heart attack Daughter   . Heart disease Daughter     Before age 56  . Cancer Sister     STOMACH  . Diabetes Sister   . Cancer Brother     BONE  . Diabetes Brother   . Hyperlipidemia Daughter   . Hypertension Daughter   . Heart disease Daughter     before age 22  . Kidney disease Daughter   . Other Daughter     varicose veins  . Diabetes Daughter   . Heart disease Son     before age 60  . Hyperlipidemia Son   . Hypertension Son   . Heart attack Son   . Anesthesia problems Neg Hx   . Hypotension Neg Hx   . Malignant hyperthermia Neg Hx   . Pseudochol deficiency Neg Hx     Social History   Social History  . Marital status: Widowed    Spouse name: N/A  . Number of children: 4  . Years of education: N/A   Occupational History  . homemaker     Social History Main Topics  . Smoking status: Former Smoker    Packs/day: 0.50    Years: 40.00    Quit date: 07/05/1986  . Smokeless tobacco: Former Neurosurgeon     Comment: quit smoking 1988  . Alcohol use No  . Drug use: No  . Sexual activity: No   Other Topics Concern  . Not on file   Social History Narrative  . No narrative on file    Review of Systems: Gen: Denies any fever, chills, sweats, anorexia, fatigue, weakness, malaise, weight loss, and sleep disorder HEENT: No visual complaints, No history of Retinopathy. Normal external appearance No Epistaxis or Sore throat. No sinusitis.   CV: Denies chest pain, angina, palpitations, syncope, orthopnea, PND, peripheral edema, and claudication. Resp: Denies dyspnea at rest, dyspnea with exercise, cough, sputum, wheezing, coughing up blood, and pleurisy. GI: Denies vomiting blood, jaundice, and fecal incontinence.   Denies dysphagia or odynophagia. GU : Denies urinary burning, blood in urine, urinary frequency, urinary hesitancy, nocturnal urination, and urinary incontinence.  No renal calculi. MS: Denies joint pain, limitation of movement, and swelling, stiffness, low back pain, extremity pain. Denies muscle weakness, cramps, atrophy.  No use of non steroidal antiinflammatory  drugs. Derm: Denies rash, itching, dry skin, hives, moles, warts, or unhealing ulcers.  Psych: Denies depression, anxiety, memory loss, suicidal ideation, hallucinations, paranoia, and confusion. Heme: Denies bruising, bleeding, and enlarged lymph nodes. Neuro: No headache.  No diplopia. No dysarthria.  No dysphasia.  No history of CVA.  No Seizures. No paresthesias.  No weakness. Endocrine No DM.  No Thyroid disease.  No Adrenal disease.  Physical Exam: Vital signs in last 24 hours: Temp:  [97.2 F (36.2 C)-98.4 F (36.9 C)] 97.2 F (36.2 C) (02/01 1100) Pulse Rate:  [77-86] 83 (02/01 1100) Resp:  [13-33] 26 (02/01 1100) BP: (92-164)/(66-100) 127/78 (02/01  1100) SpO2:  [98 %-100 %] 100 % (02/01 1100) Weight:  [48.5 kg (106 lb 14.8 oz)] 48.5 kg (106 lb 14.8 oz) (02/01 0302)   General:   Elderly poor physical condition  Head:  Normocephalic and atraumatic. Eyes:  Sclera clear, no icterus.   Conjunctiva pink. Ears:  Normal auditory acuity. Nose:  No deformity, discharge,  or lesions. Mouth:  No deformity or lesions, dentition normal. Neck:  Supple; no masses or thyromegaly. JVP not elevated Lungs:  Clear throughout to auscultation.   No wheezes, crackles, or rhonchi. No acute distress. Heart:  Regular rate and rhythm; no murmurs, clicks, rubs,  or gallops. Abdomen:  Soft, nontender and nondistended. No masses, hepatosplenomegaly or hernias noted. Normal bowel sounds, without guarding, and without rebound.   Msk:  Symmetrical without gross deformities. Normal posture. Pulses:  No carotid, renal, femoral bruits. DP and PT symmetrical and equal Extremities:  Left AKA  Neurologic:  Alert and  oriented x4;  grossly normal neurologically. Skin:  Intact without significant lesions or rashes. Cervical Nodes:  No significant cervical adenopathy. Psych:  Alert and cooperative. Normal mood and affect.  Intake/Output from previous day: 01/31 0701 - 02/01 0700 In: -  Out: 800 [Urine:800] Intake/Output this shift: No intake/output data recorded.  Lab Results:  Recent Labs  04/18/16 1619  WBC 6.8  HGB 11.6*  HCT 36.2  PLT 343   BMET  Recent Labs  04/19/16 0437 04/20/16 0417 04/21/16 0249  NA 143 141 143  K 3.9 3.2* 3.5  CL 107 108 106  CO2 23 22 23   GLUCOSE 153* 148* 137*  BUN 19 20 22*  CREATININE 2.00* 2.23* 2.62*  CALCIUM 10.0 9.6 9.8   LFT  Recent Labs  04/18/16 1619  PROT 7.2  ALBUMIN 3.7  AST 47*  ALT 21  ALKPHOS 74  BILITOT 1.8*   PT/INR No results for input(s): LABPROT, INR in the last 72 hours. Hepatitis Panel No results for input(s): HEPBSAG, HCVAB, HEPAIGM, HEPBIGM in the last 72  hours.  Studies/Results: No results found.  Assessment/Plan:  Acute Kidney injury in setting of diuresis and creatinine that has slightly increased with no use of ace inhibitors or arb medications no Nsaids and no hemodynamic instability, it appears that she was taking lasix 80mg  dialy as an outpatient and this would be reasonable to continue.  Chronic renal insufficiency  Secondary to nephrosclerosis  Not a dialysis candidate  Hyertension controlled  Bones secondary hyperparathyroidism continue on rocaltrol   Anemia stable no ESA     LOS: 3 Laurin Morgenstern W @TODAY @12 :43 PM

## 2016-04-21 NOTE — Progress Notes (Addendum)
DAILY PROGRESS NOTE  Subjective:  Echo yesterday shows LVEF is further decreased to 20-25%. Recorded another 800 cc negative. Weight is down another 1 lb to 106 lb. Creatinine continues to rise, now up to 2.62. Palliative care consulted - she has had short-term dialysis in the past, not sure if that is an option at this time. Would recommend nephrology evaluation.  Objective:  Temp:  [97.3 F (36.3 C)-98.4 F (36.9 C)] 97.4 F (36.3 C) (02/01 0739) Pulse Rate:  [77-86] 83 (02/01 0739) Resp:  [13-33] 21 (02/01 0739) BP: (92-164)/(66-100) 143/81 (02/01 0739) SpO2:  [95 %-100 %] 100 % (02/01 0739) Weight:  [106 lb 14.8 oz (48.5 kg)] 106 lb 14.8 oz (48.5 kg) (02/01 0302) Weight change: -7.1 oz (-0.2 kg)  Intake/Output from previous day: 01/31 0701 - 02/01 0700 In: -  Out: 800 [Urine:800]  Intake/Output from this shift: No intake/output data recorded.  Medications: No current facility-administered medications on file prior to encounter.    Current Outpatient Prescriptions on File Prior to Encounter  Medication Sig Dispense Refill  . acetaminophen (TYLENOL) 325 MG tablet Take 650 mg by mouth every 6 (six) hours as needed for mild pain.    Marland Kitchen albuterol-ipratropium (COMBIVENT) 18-103 MCG/ACT inhaler Inhale 1-2 puffs into the lungs at bedtime as needed for wheezing or shortness of breath.     . allopurinol (ZYLOPRIM) 100 MG tablet Take 100 mg by mouth daily.     Marland Kitchen aspirin 325 MG tablet Take 1 tablet (325 mg total) by mouth daily. 30 tablet 0  . calcitRIOL (ROCALTROL) 0.25 MCG capsule Take 0.25 mcg by mouth every other day.     . Calcium Carbonate-Vitamin D (CALCIUM-VITAMIN D) 500-200 MG-UNIT tablet Take 1 tablet by mouth daily.    Marland Kitchen docusate sodium (COLACE) 100 MG capsule Take 100 mg by mouth daily as needed for mild constipation.    . famotidine (PEPCID) 20 MG tablet Take 1 tablet (20 mg total) by mouth at bedtime. (Patient taking differently: Take 20 mg by mouth at bedtime as  needed for heartburn. ) 30 tablet 3  . furosemide (LASIX) 20 MG tablet Take 1 tablet (20 mg total) by mouth daily. (Patient taking differently: Take 80 mg by mouth daily. ) 30 tablet 0  . gabapentin (NEURONTIN) 300 MG capsule Take 300 mg by mouth 3 (three) times daily.     . hydrALAZINE (APRESOLINE) 50 MG tablet Take 1 tablet (50 mg total) by mouth 2 (two) times daily. (Patient taking differently: Take 50 mg by mouth 3 (three) times daily. ) 30 tablet 1  . isosorbide mononitrate (IMDUR) 60 MG 24 hr tablet Take 60 mg by mouth daily.    . metoCLOPramide (REGLAN) 5 MG tablet Take 1 tablet (5 mg total) by mouth every 6 (six) hours as needed for nausea. 120 tablet 3  . pravastatin (PRAVACHOL) 40 MG tablet Take 40 mg by mouth every evening.     Marland Kitchen RENVELA 800 MG tablet Take 800 mg by mouth 3 (three) times daily with meals.     . feeding supplement (BOOST / RESOURCE BREEZE) LIQD Take 1 Container by mouth 3 (three) times daily between meals. (Patient not taking: Reported on 04/19/2016)  0  . sodium bicarbonate 650 MG tablet Take 1 tablet (650 mg total) by mouth daily. (Patient not taking: Reported on 04/19/2016) 30 tablet 0    Physical Exam: General appearance: alert and no distress Neck: JVD - 1 cm above sternal notch and no carotid bruit  Lungs: clear to auscultation bilaterally Heart: regular rate and rhythm, S1, S2 normal, no murmur, click, rub or gallop Abdomen: soft, non-tender; bowel sounds normal; no masses,  no organomegaly Extremities: left AKA Pulses: 2+ and symmetric Skin: Skin color, texture, turgor normal. No rashes or lesions Neurologic: Grossly normal Psych: Flat affect  Lab Results: Results for orders placed or performed during the hospital encounter of 04/18/16 (from the past 48 hour(s))  T4, free     Status: Abnormal   Collection Time: 04/19/16  1:51 PM  Result Value Ref Range   Free T4 0.41 (L) 0.61 - 1.12 ng/dL    Comment: (NOTE) Biotin ingestion may interfere with free T4  tests. If the results are inconsistent with the TSH level, previous test results, or the clinical presentation, then consider biotin interference. If needed, order repeat testing after stopping biotin.   T3, free     Status: Abnormal   Collection Time: 04/19/16  1:51 PM  Result Value Ref Range   T3, Free 0.7 (L) 2.0 - 4.4 pg/mL    Comment: (NOTE) Performed At: [facility] [place], [place] [id number] Lindon Romp MD WU:[id number]   Basic metabolic panel     Status: Abnormal   Collection Time: 04/20/16  4:17 AM  Result Value Ref Range   Sodium 141 135 - 145 mmol/L   Potassium 3.2 (L) 3.5 - 5.1 mmol/L    Comment: DELTA CHECK NOTED   Chloride 108 101 - 111 mmol/L   CO2 22 22 - 32 mmol/L   Glucose, Bld 148 (H) 65 - 99 mg/dL   BUN 20 6 - 20 mg/dL   Creatinine, Ser 2.23 (H) 0.44 - 1.00 mg/dL   Calcium 9.6 8.9 - 10.3 mg/dL   GFR calc non Af Amer 18 (L) >60 mL/min   GFR calc Af Amer 21 (L) >60 mL/min    Comment: (NOTE) The eGFR has been calculated using the CKD EPI equation. This calculation has not been validated in all clinical situations. eGFR's persistently <60 mL/min signify possible Chronic Kidney Disease.    Anion gap 11 5 - 15  Basic metabolic panel     Status: Abnormal   Collection Time: 04/21/16  2:49 AM  Result Value Ref Range   Sodium 143 135 - 145 mmol/L   Potassium 3.5 3.5 - 5.1 mmol/L   Chloride 106 101 - 111 mmol/L   CO2 23 22 - 32 mmol/L   Glucose, Bld 137 (H) 65 - 99 mg/dL   BUN 22 (H) 6 - 20 mg/dL   Creatinine, Ser 2.62 (H) 0.44 - 1.00 mg/dL   Calcium 9.8 8.9 - 10.3 mg/dL   GFR calc non Af Amer 15 (L) >60 mL/min   GFR calc Af Amer 17 (L) >60 mL/min    Comment: (NOTE) The eGFR has been calculated using the CKD EPI equation. This calculation has not been validated in all clinical situations. eGFR's persistently <60 mL/min signify possible Chronic Kidney Disease.    Anion gap 14 5 - 15  Magnesium     Status: None   Collection  Time: 04/21/16  2:49 AM  Result Value Ref Range   Magnesium 1.8 1.7 - 2.4 mg/dL    Imaging: No results found.  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 Would favor a nephrology opinion today. Could consider inotrope therapy if renal function tapers off, however, this would be palliative at best.   Time Spent Directly with Patient:  15 minutes  Length of Stay:  LOS: 3 days   Pixie Casino, MD, [facility] Attending Cardiologist [place] 04/21/2016, 8:36 AM

## 2016-04-21 NOTE — Progress Notes (Signed)
In desperation for SDU beds, RN paged this NP asking if pt could transfer to tele. NP reviewed chart notes and VS. Original reason for SDU seemed to be bipap, but pt has been on RA with normal sats for over 2 days per flowsheet. BP and HR are stable. Palliative care has been consulted for goals of care due to her poor prognosis and advanced chronic medical issues. Family did make pt a DNR during stay.  After discussing with RN and review of chart, there is no obvious reason to keep pt on SDU at this time (no drips, bipap, etc). OK to transfer to tele.  KJKG, NP [facility]

## 2016-04-21 NOTE — Progress Notes (Signed)
Pharmacist Heart Failure Core Measure Documentation  Assessment: [name] has an EF documented as 20-25% on 04/20/16 by ECHO.  Rationale: Heart failure patients with left ventricular systolic dysfunction (LVSD) and an EF < 40% should be prescribed an angiotensin converting enzyme inhibitor (ACEI) or angiotensin receptor blocker (ARB) at discharge unless a contraindication is documented in the medical record.  This patient is not currently on an ACEI or ARB for HF.  This note is being placed in the record in order to provide documentation that a contraindication to the use of these agents is present for this encounter.  ACE Inhibitor or Angiotensin Receptor Blocker is contraindicated (specify all that apply)  []   ACEI allergy AND ARB allergy []   Angioedema []   Moderate or severe aortic stenosis []   Hyperkalemia []   Hypotension []   Renal artery stenosis [x]   Worsening renal function, preexisting renal disease or dysfunction   Gardner Candle 04/21/2016 3:16 PM

## 2016-04-21 NOTE — Progress Notes (Signed)
Daily Progress Note   Patient Name: [name]       Date: 04/21/2016 DOB: 1924-03-17  Age: 83 y.o. MRN#: [id number] Attending Physician: Rosita Fire, MD Primary Care Physician: Merrilee Seashore, MD Admit Date: 04/18/2016  Reason for Consultation/Follow-up: Non pain symptom management  Subjective: [name] is relatively unchanged from yesterday. She was a bit more lethargic today, but still woke to voice and remained pleasantly oriented to self only. She has no acute complaints and continues to follow simple commands. No family at the bedside.   Length of Stay: 3  Current Medications: Scheduled Meds:  . allopurinol  200 mg Oral Daily  . aspirin EC  325 mg Oral Daily  . calcitRIOL  0.25 mcg Oral QODAY  . enoxaparin (LOVENOX) injection  30 mg Subcutaneous Q24H  . famotidine  20 mg Oral QHS  . feeding supplement  1 Container Oral TID BM  . furosemide  60 mg Intravenous Daily  . hydrALAZINE  50 mg Oral BID  . isosorbide mononitrate  60 mg Oral Daily  . levothyroxine  100 mcg Oral QAC breakfast  . metoprolol tartrate  12.5 mg Oral BID  . nystatin  5 mL Mouth/Throat QID  . potassium chloride  40 mEq Oral Daily  . pravastatin  40 mg Oral QPM  . sevelamer carbonate  800 mg Oral TID WC  . sodium chloride flush  3 mL Intravenous Q12H  . Vitamin D (Ergocalciferol)  50,000 Units Oral Once per day on Wed Fri    Continuous Infusions:   PRN Meds: sodium chloride, acetaminophen, albuterol, docusate sodium, ipratropium-albuterol, nitroGLYCERIN, ondansetron (ZOFRAN) IV, sodium chloride flush  Physical Exam         Constitutional: She has a sickly appearance.  Slightly more lethargic today. HENT:  Head: Normocephalic and atraumatic.  Mouth/Throat: Mucous membranes are moist. Oropharyngeal exudate (thrush noted on tongue)  present.  Cardiovascular: Regular rate.    Pulmonary/Chest: No accessory muscle usage. Tachypnea noted. No respiratory distress. She has no wheezes.  Abdominal: Soft. Bowel sounds are normal.  Musculoskeletal: She exhibits edema (race edema in lower extremity).  Neurological: She is alert.  Oriented to self only. Calm, pleasant, and conversational. Does follow simple commands.  Skin: Skin is warm and dry.  Psychiatric: She has a normal mood and affect. Her speech is normal and behavior is normal. Cognition and memory are impaired. She exhibits abnormal recent memory and abnormal remote memory.   Vital Signs: BP (!) 143/81 (BP Location: Right Arm)   Pulse 83   Temp 97.4 F (36.3 C) (Oral)   Resp (!) 21   Ht 5' (1.524 m)   Wt 48.5 kg (106 lb 14.8 oz)   SpO2 100%   BMI 20.88 kg/m  SpO2: SpO2: 100 % O2 Device: O2 Device: Not Delivered O2 Flow Rate:    Intake/output summary:  Intake/Output Summary (Last 24 hours) at 04/21/16 1037 Last data filed at 04/21/16 0600  Gross per 24 hour  Intake                0 ml  Output              800 ml  Net             -  800 ml   LBM:   Baseline Weight: Weight: 50.3 kg (111 lb) Most recent weight: Weight: 48.5 kg (106 lb 14.8 oz)       Palliative Assessment/Data: PPS 40-50%   Flowsheet Rows   Flowsheet Row Most Recent Value  Intake Tab  Referral Department  Hospitalist  Unit at Time of Referral  ICU  Palliative Care Primary Diagnosis  Cardiac  Date Notified  04/20/16  Palliative Care Type  Return patient Palliative Care  Reason for referral  Clarify Goals of Care  Date of Admission  04/18/16  # of days IP prior to Palliative referral  2  Clinical Assessment  Psychosocial & Spiritual Assessment  Palliative Care Outcomes      Patient Active Problem List   Diagnosis Date Noted  . S/P AKA (above knee amputation) unilateral, left ([place])   . CHF (congestive heart failure) ([place]) 04/18/2016  . Respiratory distress   . Acute on chronic  systolic congestive heart failure ([place]) 01/16/2016  . Palliative care encounter   . Altered mental status   . Goals of care, counseling/discussion   . Palliative care by specialist   . Aphasia following other cerebrovascular disease   . Hemi-neglect of right side   . Cerebral thrombosis with cerebral infarction 12/30/2015  . Cerebrovascular accident (CVA) involving left middle cerebral artery [facility])   . Altered mental state 12/29/2015  . Acute right-sided weakness 12/29/2015  . Dysphagia   . FTT (failure to thrive) in adult 11/08/2015  . Nausea & vomiting 11/08/2015  . Metabolic acidosis 47/65/4650  . Hypertensive heart disease with heart failure ([place]) 10/07/2015  . Junctional bradycardia 09/18/2015  . Atypical chest pain 06/23/2014  . Neck pain   . Food impaction of esophagus   . Epiglottitis 04/13/2014  . Wound disruption, post-op, skin 02/05/2014  . Pre-operative cardiovascular examination 12/13/2013  . Foot pain 09/04/2013  . CKD (chronic kidney disease) stage 4, GFR 15-29 ml/min (HCC) 09/04/2013  . Essential hypertension 08/23/2013  . Malnutrition of moderate degree ([place]) 08/21/2013  . Chronic diastolic heart failure ([place]) 08/15/2013  . Anemia of chronic disease 08/15/2013  . Hyperkalemia 08/15/2013  . Chronic total occlusion of artery of the extremities ([place]) 02/27/2013  . Dyslipidemia 01/05/2013  . Chronic renal insufficiency, stage IV (severe)- HD started 01/05/13 01/05/2013  . Diabetes mellitus type 2 with peripheral artery disease ([place]) 01/05/2013  . Peripheral vascular disease ([place]) 10/19/2011  . CAD - moderate at cath July 2012 (medical Rx) 02/08/2011  . 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 Severe hypothyroidism 02/08/2011    Palliative Care Assessment & Plan   HPI: 83 y.o. female  with past medical history of hypothyroidism, hypertension, chronic kidney disease stage IV, congestive heart failure with EF of 30%-35% ,  peripheral vascular disease status post left BKA, and prior stroke who presented to the ED with progressive dyspnea and increasing lower extremity edema and was admitted on 04/18/2016 with acute systolic CHF. She had been vomiting a few days before admission and had not been able to get her normal medications. On admission she was also noted to have TSH 261, T4 0.41, and had been reportedly not been taking Levothyroxine. At baseline pt is predominantly in bed or in a wheelchair, she lives with her son who manages her medication.   Assessment: I met with [name]'s family on 1/31 for a goals of care conversation. Please see my initial consult note for full details. In brief, the family felt  comfort was the priority but still wanted to pursue full scope of interventions up to cardiac or respiratory failure. She was made DNR.   Today, [name] exam is relatively unchanged. Unfortunately, her creatinine continues to increase. Nephrology has been consulted, but have not yet seen her. Based on their recommendation, I will likely need to meet with her family again to facilitate another goals of care conversation. In the meantime, [name] has no uncontrolled symptoms and appears calm and comfortable in bed. Her medication intake remains poor (very slow to take medications, disinterested in crushed medication in food, etc. Per care nurse). I suspect this will remain an issue for her, and did have a conversation with her son on 1/31 to discuss different ways to facilitate med administration (trying different foods and liquids, timing of medications, etc.). I have also asked pharmacy to speak with [name] closer to discharge to educate on further on medication administration options.   Recommendations/Plan:  DNR; continue to treat what is treatable  I will f/u with family after Nephrology weighs-in on worsening kidney function  Plan for discharge back home with [facility] and Palliative Care; [name] helps with all  of her ADLs and manages her medications  Spoke with Pharmacist, plan to discuss med administration with [name] closer to discharge (what can be crushed or dissolved in water, how to take Synthroid, etc.)  Goals of Care and Additional Recommendations:  Limitations on Scope of Treatment: Full Scope Treatment up to DNR. Will need to re-discuss with family depending on Nephrology's recommendation  Code Status:  DNR  Prognosis:   Unable to determine  Discharge Planning:  Home with [facility] and palliative care  Care plan was discussed with pt's nurse and pharmacist.  Thank you for allowing the Palliative Medicine Team to assist in the care of this patient.  Total time: 25 minutes    Greater than 50%  of this time was spent counseling and coordinating care related to the above assessment and plan.  Charlynn Court, NP Palliative Medicine Team [phone] pager (7a-5p) Team Phone # [phone]

## 2016-04-21 NOTE — Progress Notes (Signed)
Patient transferred  [address] . Alert to self. Very pleasant. Vitals sign stable. Patient put on telemetry and verified by second person. Oriented to room.

## 2016-04-22 LAB — BASIC METABOLIC PANEL
Anion gap: 14 (ref 5–15)
BUN: 24 mg/dL — AB (ref 6–20)
CALCIUM: 9.7 mg/dL (ref 8.9–10.3)
CO2: 22 mmol/L (ref 22–32)
CREATININE: 2.61 mg/dL — AB (ref 0.44–1.00)
Chloride: 106 mmol/L (ref 101–111)
GFR calc Af Amer: 17 mL/min — ABNORMAL LOW (ref >60)
GFR, EST NON AFRICAN AMERICAN: 15 mL/min — AB (ref >60)
GLUCOSE: 149 mg/dL — AB (ref 65–99)
Potassium: 3.9 mmol/L (ref 3.5–5.1)
SODIUM: 142 mmol/L (ref 135–145)

## 2016-04-22 LAB — IRON AND TIBC
IRON: 33 ug/dL (ref 28–170)
Saturation Ratios: 13 % (ref 10.4–31.8)
TIBC: 256 ug/dL (ref 250–450)
UIBC: 223 ug/dL

## 2016-04-22 MED ORDER — BISACODYL 10 MG RE SUPP
10.0000 mg | Freq: Once | RECTAL | Status: AC
  Administered 2016-04-22: 10 mg via RECTAL
  Filled 2016-04-22: qty 1

## 2016-04-22 MED ORDER — GABAPENTIN 300 MG PO CAPS: 300.0000 mg | ORAL_CAPSULE | Freq: Every day | ORAL | Status: DC

## 2016-04-22 MED ORDER — POTASSIUM CHLORIDE CRYS ER 20 MEQ PO TBCR: 20.0000 meq | EXTENDED_RELEASE_TABLET | Freq: Every day | ORAL | 0 refills | Status: DC

## 2016-04-22 MED ORDER — SENNOSIDES 8.8 MG/5ML PO SYRP
5.0000 mL | ORAL_SOLUTION | Freq: Two times a day (BID) | ORAL | Status: DC
  Administered 2016-04-22 – 2016-04-23 (×3): 5 mL via ORAL
  Filled 2016-04-22 (×3): qty 5

## 2016-04-22 MED ORDER — ISOSORBIDE DINITRATE 20 MG PO TABS: 20.0000 mg | ORAL_TABLET | Freq: Three times a day (TID) | ORAL | 0 refills | Status: AC

## 2016-04-22 MED ORDER — POTASSIUM CHLORIDE 20 MEQ/15ML (10%) PO SOLN: 20.0000 meq | Freq: Every day | ORAL | 0 refills | Status: DC

## 2016-04-22 MED ORDER — SEVELAMER CARBONATE 0.8 G PO PACK: 0.8000 g | PACK | Freq: Three times a day (TID) | ORAL | 0 refills | Status: DC

## 2016-04-22 MED ORDER — FUROSEMIDE 80 MG PO TABS: 80.0000 mg | ORAL_TABLET | Freq: Two times a day (BID) | ORAL | 0 refills | Status: DC

## 2016-04-22 MED ORDER — POTASSIUM CHLORIDE CRYS ER 20 MEQ PO TBCR
40.0000 meq | EXTENDED_RELEASE_TABLET | Freq: Every day | ORAL | Status: DC
  Administered 2016-04-22 – 2016-04-23 (×2): 40 meq via ORAL
  Filled 2016-04-22 (×2): qty 2

## 2016-04-22 MED ORDER — LEVOTHYROXINE SODIUM 100 MCG PO TABS: 100.0000 ug | ORAL_TABLET | Freq: Every day | ORAL | 0 refills | Status: AC

## 2016-04-22 MED ORDER — METOPROLOL TARTRATE 25 MG PO TABS: 12.5000 mg | ORAL_TABLET | Freq: Two times a day (BID) | ORAL | 0 refills | Status: DC

## 2016-04-22 MED ORDER — FUROSEMIDE 80 MG PO TABS
80.0000 mg | ORAL_TABLET | Freq: Two times a day (BID) | ORAL | Status: DC
  Administered 2016-04-22 – 2016-04-23 (×3): 80 mg via ORAL
  Filled 2016-04-22 (×3): qty 1

## 2016-04-22 NOTE — Discharge Summary (Addendum)
Physician Discharge Summary  PEARLE WANDLER ZOX:[id number] DOB: May 17, 1923 DOA: 04/18/2016  PCP: Georgianne Fick, MD  Admit date: 04/18/2016 Discharge date: 04/22/2016  Admitted From:home Disposition: home with care from family  Recommendations for Outpatient Follow-up:  1. Follow up with PCP in 1-2 weeks 2. Please obtain BMP/CBC in one week 3. Follow-up with the cardiologist and PCP.   Home Health: Has family support and equipments at home Equipment/Devices: Not on this discharge Discharge Condition: Stable CODE STATUS: DO NOT RESUSCITATE Diet recommendation: Heart healthy  Brief/Interim Summary:83 y.o.femalewith medical history significant of hypertension, chronic kidney disease stage IV, congestive heart failure with EF of 30%-35 % , peripheral vascular disease status post left BKA, prior stroke presented from home with worsening shortness of breath, dyspnea on exertion for worsening for 2 days.  # Acute on chronic combined systolic and diastolic congestive heart failure: Patient was treated with IV diuretics. Evaluated by both cardiologist and nephrologist. Patient clinically improved. Oxygen saturation is acceptable in room air. The dose of Lasix adjusted to 80 mg twice a day. -echocardiogram showed EF of 20-25% with diffuse hypokinesis. Patient also with a grade 2 diastolic dysfunction. -Continue aspirin hydralazine, Imdur, metoprolol -I discussed with patient's cardiologist Dr. Rennis Golden who has seen patient in this hospitalization, he is okay to discharge patient with oral Lasix and outpatient follow-up. -Patient was evaluated by palliative care service, she is now DNR/DNI. I believe patient will benefit mostly by care at home this time. -I discussed with the patient's son Mathis Fare with the caretaker regarding discharge planning.  #Chronic kidney disease stage IV: Serum creatinine level fluctuating likely hemodynamically mediated in the setting of IV diuretics. Evaluated by  nephrologist. Serum creatinine level around baseline. Patient has no AKI.  #Hypokalemia: Improved him on discharging with oral potassium chloride.  #Hypertension: Blood pressure acceptable. Continue home medications. Monitor blood pressure.  #Peripheral vascular disease status post left BKA: Continue aspirin  #Hypothyroidism: Apparently patient was not to taking Synthroid. TSH was markedly elevated at 261 with low free T3 and T4. Resumed Synthroid 100 g which is patient's prior home dose. She needs follow-up thyroid function test in 4-6 weeks as an outpatient.  #Goals of care discussion: Palliative consult evaluation appreciated. Patient is DNR/DNI at this time. Continue supportive care and medical management.  83 y.o. patient with congestive heart failure treated with IV diuretics. Evaluated by both cardiologist and nephrologist. Discharging with the Lasix 80 mg twice a day and potassium chloride. Evaluated by palliative care service now DNR/DNI. Also discussed with the patient's son. I believe patient will benefit from outpatient care and follow-ups.  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   Peripheral vascular disease (HCC)   CKD (chronic kidney disease) stage 4, GFR 15-29 ml/min (HCC)   Hypertensive heart disease with heart failure (HCC)   CHF (congestive heart failure) (HCC)   S/P AKA (above knee amputation) unilateral, left [facility])    Discharge Instructions  Discharge Instructions    (HEART FAILURE PATIENTS) Call MD:  Anytime you have any of the following symptoms: 1) 3 pound weight gain in 24 hours or 5 pounds in 1 week 2) shortness of breath, with or without a dry hacking cough 3) swelling in the hands, feet or stomach 4) if you have to sleep on extra pillows at night in order to breathe.    Complete by:  As directed    Call MD for:  difficulty breathing, headache or  visual  disturbances    Complete by:  As directed    Call MD for:  hives    Complete by:  As directed    Call MD for:  persistant dizziness or light-headedness    Complete by:  As directed    Call MD for:  persistant nausea and vomiting    Complete by:  As directed    Call MD for:  temperature >100.4    Complete by:  As directed    Diet - low sodium heart healthy    Complete by:  As directed    Increase activity slowly    Complete by:  As directed      Allergies as of 04/22/2016      Reactions   Aspirin Nausea And Vomiting, Other (See Comments)   325 mg (adult strength) [name]       Medication List    STOP taking these medications   isosorbide mononitrate 60 MG 24 hr tablet Commonly known as:  IMDUR   RENVELA 800 MG tablet Generic drug:  sevelamer carbonate Replaced by:  sevelamer carbonate 0.8 g Pack packet     TAKE these medications   acetaminophen 325 MG tablet Commonly known as:  TYLENOL Take 650 mg by mouth every 6 (six) hours as needed for mild pain.   albuterol-ipratropium 18-103 MCG/ACT inhaler Commonly known as:  COMBIVENT Inhale 1-2 puffs into the lungs at bedtime as needed for wheezing or shortness of breath.   allopurinol 100 MG tablet Commonly known as:  ZYLOPRIM Take 100 mg by mouth daily.   aspirin 325 MG tablet Take 1 tablet (325 mg total) by mouth daily.   calcitRIOL 0.25 MCG capsule Commonly known as:  ROCALTROL Take 0.25 mcg by mouth every other day.   calcium-vitamin D 500-200 MG-UNIT tablet Take 1 tablet by mouth daily.   docusate sodium 100 MG capsule Commonly known as:  COLACE Take 100 mg by mouth daily as needed for mild constipation.   famotidine 20 MG tablet Commonly known as:  PEPCID Take 1 tablet (20 mg total) by mouth at bedtime. What changed:  when to take this  reasons to take this   feeding supplement Liqd Take 1 Container by mouth 3 (three) times daily between  meals.   furosemide 80 MG tablet Commonly known as:  LASIX Take 1 tablet (80 mg total) by mouth 2 (two) times daily. What changed:  medication strength  how much to take  when to take this   gabapentin 300 MG capsule Commonly known as:  NEURONTIN Take 1 capsule (300 mg total) by mouth at bedtime. What changed:  when to take this   hydrALAZINE 50 MG tablet Commonly known as:  APRESOLINE Take 1 tablet (50 mg total) by mouth 2 (two) times daily. What changed:  when to take this   isosorbide dinitrate 20 MG tablet Commonly known as:  ISORDIL Take 1 tablet (20 mg total) by mouth 3 (three) times daily.   levothyroxine 100 MCG tablet Commonly known as:  SYNTHROID, LEVOTHROID Take 1 tablet (100 mcg total) by mouth daily.   metoCLOPramide 5 MG tablet Commonly known as:  REGLAN Take 1 tablet (5 mg total) by mouth every 6 (six) hours as needed for nausea.   metoprolol tartrate 25 MG tablet Commonly known as:  LOPRESSOR Take 0.5 tablets (12.5 mg total) by mouth 2 (two) times daily.   potassium chloride 20 MEQ/15ML (10%) Soln  Take 15 mLs (20 mEq total) by mouth daily.   pravastatin 40 MG tablet Commonly known as:  PRAVACHOL Take 40 mg by mouth every evening.   sevelamer carbonate 0.8 g Pack packet Commonly known as:  RENVELA Take 0.8 g by mouth 3 (three) times daily with meals. Replaces:  RENVELA 800 MG tablet   sodium bicarbonate 650 MG tablet Take 1 tablet (650 mg total) by mouth daily.   VITAMIN D PO Take 1 capsule by mouth daily.      Follow-up Information    RAMACHANDRAN,AJITH, MD. Schedule an appointment as soon as possible for a visit in 1 week(s).   Specialty:  Internal Medicine Contact information: [address] [place] [address] [place] [address] [phone]        Chrystie Nose, MD. Schedule an appointment as soon as possible for a visit in 2 week(s).   Specialty:  Cardiology Contact information: [address] [place] [address] [place]  [address] [phone]          Allergies  Allergen Reactions  . Aspirin Nausea And Vomiting and Other (See Comments)    325 mg (adult strength) [name]     Consultations: Cardiologist Nephrologist  Procedures/Studies: Echo  Subjective: Patient was seen and examined at bedside. Patient was alert awake and reported feeling good. Denied headache, dizziness, nausea vomiting, chest pain.   Discharge Exam: Vitals:   04/22/16 1000 04/22/16 1359  BP: (!) 154/92 111/65  Pulse: (!) 102 72  Resp:  18  Temp:  97.4 F (36.3 C)   Vitals:   04/21/16 2251 04/22/16 0558 04/22/16 1000 04/22/16 1359  BP: (!) 147/69 (!) 156/85 (!) 154/92 111/65  Pulse: 78 86 (!) 102 72  Resp: 18 18  18   Temp: 97.4 F (36.3 C) 97.4 F (36.3 C)  97.4 F (36.3 C)  TempSrc: Axillary Oral  Axillary  SpO2: 97% 99% 100% 100%  Weight: 49 kg (108 lb 0.4 oz) 49.4 kg (108 lb 14.5 oz)    Height:        General: Pt is alert, awake, not in acute distress Cardiovascular: RRR, S1/S2 +, no rubs, no gallops Respiratory: CTA bilaterally, no wheezing, no rhonchi Abdominal: Soft, NT, ND, bowel sounds + Extremities: no edema, no cyanosis Neurology: Alert awake and following commands.   The results of significant diagnostics from this hospitalization (including imaging, microbiology, ancillary and laboratory) are listed below for reference.     Microbiology: Recent Results (from the past 240 hour(s))  MRSA PCR Screening     Status: None   Collection Time: 04/18/16  8:50 PM  Result Value Ref Range Status   MRSA by PCR NEGATIVE NEGATIVE Final    Comment:        The GeneXpert MRSA Assay (FDA approved for NASAL specimens only), is one component of a comprehensive MRSA colonization surveillance program. It is not intended to diagnose MRSA infection nor to guide or monitor treatment for MRSA infections.      Labs: BNP (last 3 results)  Recent  Labs  01/16/16 1151 04/18/16 1620  BNP 3,168.6* 2,284.3*   Basic Metabolic Panel:  Recent Labs Lab 04/18/16 1619 04/19/16 0437 04/20/16 0417 04/21/16 0249 04/22/16 0225  NA 142 143 141 143 142  K 5.1 3.9 3.2* 3.5 3.9  CL 109 107 108 106 106  CO2 22 23 22 23 22   GLUCOSE 165* 153* 148* 137* 149*  BUN 19 19 20  22*  24*  CREATININE 2.16* 2.00* 2.23* 2.62* 2.61*  CALCIUM 9.6 10.0 9.6 9.8 9.7  MG  --   --   --  1.8  --    Liver Function Tests:  Recent Labs Lab 04/18/16 1619  AST 47*  ALT 21  ALKPHOS 74  BILITOT 1.8*  PROT 7.2  ALBUMIN 3.7   No results for input(s): LIPASE, AMYLASE in the last 168 hours. No results for input(s): AMMONIA in the last 168 hours. CBC:  Recent Labs Lab 04/18/16 1619  WBC 6.8  NEUTROABS 3.8  HGB 11.6*  HCT 36.2  MCV 87.7  PLT 343   Cardiac Enzymes:  Recent Labs Lab 04/18/16 2135 04/19/16 0437  TROPONINI 0.07* 0.09*   BNP: Invalid input(s): POCBNP CBG: No results for input(s): GLUCAP in the last 168 hours. D-Dimer No results for input(s): DDIMER in the last 72 hours. Hgb A1c No results for input(s): HGBA1C in the last 72 hours. Lipid Profile No results for input(s): CHOL, HDL, LDLCALC, TRIG, CHOLHDL, LDLDIRECT in the last 72 hours. Thyroid function studies No results for input(s): TSH, T4TOTAL, T3FREE, THYROIDAB in the last 72 hours.  Invalid input(s): FREET3 Anemia work up  Recent Labs  04/22/16 0942  TIBC 256  IRON 33   Urinalysis    Component Value Date/Time   COLORURINE YELLOW 01/16/2016 1243   APPEARANCEUR CLOUDY (A) 01/16/2016 1243   LABSPEC 1.017 01/16/2016 1243   PHURINE 5.5 01/16/2016 1243   GLUCOSEU NEGATIVE 01/16/2016 1243   HGBUR NEGATIVE 01/16/2016 1243   BILIRUBINUR SMALL (A) 01/16/2016 1243   KETONESUR NEGATIVE 01/16/2016 1243   PROTEINUR 100 (A) 01/16/2016 1243   UROBILINOGEN 0.2 08/18/2013 1807   NITRITE NEGATIVE 01/16/2016 1243   LEUKOCYTESUR NEGATIVE 01/16/2016 1243   Sepsis  Labs Invalid input(s): PROCALCITONIN,  WBC,  LACTICIDVEN Microbiology Recent Results (from the past 240 hour(s))  MRSA PCR Screening     Status: None   Collection Time: 04/18/16  8:50 PM  Result Value Ref Range Status   MRSA by PCR NEGATIVE NEGATIVE Final    Comment:        The GeneXpert MRSA Assay (FDA approved for NASAL specimens only), is one component of a comprehensive MRSA colonization surveillance program. It is not intended to diagnose MRSA infection nor to guide or monitor treatment for MRSA infections.      Time coordinating discharge: 31 minutes  SIGNED:   Maxie Barb, MD  [facility] 04/22/2016, 4:00 PM  If 7PM-7AM, please contact night-coverage [email or web address] Password [email or web address]

## 2016-04-22 NOTE — Care Management Important Message (Signed)
Important Message  Patient Details  Name: [name] MRN: [id number] Date of Birth: Jun 11, 1923   Medicare Important Message Given:  Yes    Larwence Tu 04/22/2016, 2:09 PM

## 2016-04-22 NOTE — Progress Notes (Signed)
Patient states she does not wear CPAP and does not want to wear one at this time.

## 2016-04-22 NOTE — Progress Notes (Signed)
Subjective: Interval History: has no complaint, feels good.  Objective: Vital signs in last 24 hours: Temp:  [97.2 F (36.2 C)-97.8 F (36.6 C)] 97.4 F (36.3 C) (02/02 0558) Pulse Rate:  [72-86] 86 (02/02 0558) Resp:  [10-28] 18 (02/02 0558) BP: (122-157)/(64-96) 156/85 (02/02 0558) SpO2:  [97 %-100 %] 99 % (02/02 0558) Weight:  [49 kg (108 lb 0.4 oz)-49.4 kg (108 lb 14.5 oz)] 49.4 kg (108 lb 14.5 oz) (02/02 0558) Weight change: 0.5 kg (1 lb 1.6 oz)  Intake/Output from previous day: 02/01 0701 - 02/02 0700 In: 300 [P.O.:300] Out: 850 [Urine:850] Intake/Output this shift: No intake/output data recorded.  General appearance: alert, cooperative, mildly obese and slowed mentation Resp: rales bibasilar Cardio: S1, S2 normal and systolic murmur: holosystolic 2/6, blowing at apex GI: pos bs, liver down 5 cm,soft Extremities: bilat amp LE  Lab Results: No results for input(s): WBC, HGB, HCT, PLT in the last 72 hours. BMET:  Recent Labs  04/21/16 0249 04/22/16 0225  NA 143 142  K 3.5 3.9  CL 106 106  CO2 23 22  GLUCOSE 137* 149*  BUN 22* 24*  CREATININE 2.62* 2.61*  CALCIUM 9.8 9.7   No results for input(s): PTH in the last 72 hours. Iron Studies: No results for input(s): IRON, TIBC, TRANSFERRIN, FERRITIN in the last 72 hours.  Studies/Results: No results found.  I have reviewed the patient's current medications.  Assessment/Plan: 1 CKD4-5   There is no AKI , this is all CKD and is at her baselin 2.5-4.   Will put on po diuretics. 2 Anemia check Fe 3 HPTH on vit D 4 CAD 5 PVD 6 DM controlled 7 CHF controlled P Po Lasix, check Fe, can d/c home from our perspective.    LOS: 4 days   [name] L 04/22/2016,7:01 AM

## 2016-04-22 NOTE — Progress Notes (Signed)
D/W Dr. Ronalee Belts today. Renal function has stabilized. Appreciate nephrology recommendations. Not considered a dialysis candidate. Now made DNR. Appreciate palliative care continued follow-up. I'm happy to see her back in the office after discharge.  Cardiology will sign-off. Please call with questions.  Chrystie Nose, MD, [facility] Attending Cardiologist [facility]

## 2016-04-22 NOTE — Progress Notes (Signed)
Daily Progress Note   Patient Name: [name]       Date: 04/22/2016 DOB: Nov 29, 1923  Age: 83 y.o. MRN#: [id number] Attending Physician: Rosita Fire, MD Primary Care Physician: Merrilee Seashore, MD Admit Date: 04/18/2016  Reason for Consultation/Follow-up: Establishing goals of care, Non pain symptom management and Psychosocial/spiritual support  Subjective: [name] is more awake and interactive today. She was oriented to self and place, though continues to be confused on time and situation. She did recognize me from prior visits. Her only complaint today is feeling bored and wanting to be at home.   Length of Stay: 4  Current Medications: Scheduled Meds:  . allopurinol  200 mg Oral Daily  . aspirin  325 mg Oral Daily  . calcitRIOL  0.25 mcg Oral QODAY  . enoxaparin (LOVENOX) injection  30 mg Subcutaneous Q24H  . famotidine  20 mg Oral QHS  . feeding supplement  1 Container Oral TID BM  . furosemide  80 mg Oral BID  . hydrALAZINE  50 mg Oral BID  . isosorbide mononitrate  60 mg Oral Daily  . levothyroxine  100 mcg Oral QAC breakfast  . metoprolol tartrate  12.5 mg Oral BID  . nystatin  5 mL Mouth/Throat QID  . potassium chloride  40 mEq Oral Daily  . pravastatin  40 mg Oral QPM  . sevelamer carbonate  800 mg Oral TID WC  . sodium chloride flush  3 mL Intravenous Q12H  . Vitamin D (Ergocalciferol)  50,000 Units Oral Once per day on Wed Fri    Continuous Infusions:   PRN Meds: sodium chloride, acetaminophen, albuterol, docusate sodium, ipratropium-albuterol, nitroGLYCERIN, ondansetron (ZOFRAN) IV, sodium chloride flush  Physical Exam         Constitutional: She has a sickly appearance.  HENT:  Head: Normocephalic and atraumatic.  Mouth/Throat: Mucous membranes are moist. Oropharyngeal exudate (thrush noted  on tongue, improved) present.  Cardiovascular: Regular rate.    Pulmonary/Chest: No accessory muscle usage. No respiratory distress no tachypnea. She has no wheezes.  Abdominal: Soft. Bowel sounds are normal.  Musculoskeletal: She exhibits no edema. L BKA. Neurological: She is alert.  Oriented to self and place. Calm, pleasant, and conversational. Does follow simple commands.  Skin: Skin is warm and dry.  Psychiatric: She has a normal mood and affect. Her speech is normal and behavior is normal. Cognition and memory are impaired. She exhibits abnormal recent memory and abnormal remote memory.   Vital Signs: BP (!) 156/85 (BP Location: Right Arm)   Pulse 86   Temp 97.4 F (36.3 C) (Oral)   Resp 18   Ht 5' (1.524 m)   Wt 49.4 kg (108 lb 14.5 oz)   SpO2 99%   BMI 21.27 kg/m  SpO2: SpO2: 99 % O2 Device: O2 Device: Not Delivered O2 Flow Rate:    Intake/output summary:   Intake/Output Summary (Last 24 hours) at 04/22/16 0856 Last data filed at 04/22/16 0604  Gross per 24 hour  Intake              240 ml  Output              780 ml  Net             -  540 ml   LBM: Last BM Date:  (PTA) Baseline Weight: Weight: 50.3 kg (111 lb) Most recent weight: Weight: 49.4 kg (108 lb 14.5 oz)       Palliative Assessment/Data: PPS 40-50%   Flowsheet Rows   Flowsheet Row Most Recent Value  Intake Tab  Referral Department  Hospitalist  Unit at Time of Referral  ICU  Palliative Care Primary Diagnosis  Cardiac  Date Notified  04/20/16  Palliative Care Type  Return patient Palliative Care  Reason for referral  Clarify Goals of Care  Date of Admission  04/18/16  # of days IP prior to Palliative referral  2  Clinical Assessment  Psychosocial & Spiritual Assessment  Palliative Care Outcomes      Patient Active Problem List   Diagnosis Date Noted  . S/P AKA (above knee amputation) unilateral, left ([place])   . CHF (congestive heart failure) ([place]) 04/18/2016  . Respiratory distress   .  Acute on chronic systolic congestive heart failure ([place]) 01/16/2016  . Palliative care encounter   . Altered mental status   . Goals of care, counseling/discussion   . Palliative care by specialist   . Aphasia following other cerebrovascular disease   . Hemi-neglect of right side   . Cerebral thrombosis with cerebral infarction 12/30/2015  . Cerebrovascular accident (CVA) involving left middle cerebral artery [facility])   . Altered mental state 12/29/2015  . Acute right-sided weakness 12/29/2015  . Dysphagia   . FTT (failure to thrive) in adult 11/08/2015  . Nausea & vomiting 11/08/2015  . Metabolic acidosis 11/65/7903  . Hypertensive heart disease with heart failure ([place]) 10/07/2015  . Junctional bradycardia 09/18/2015  . Atypical chest pain 06/23/2014  . Neck pain   . Food impaction of esophagus   . Epiglottitis 04/13/2014  . Wound disruption, post-op, skin 02/05/2014  . Pre-operative cardiovascular examination 12/13/2013  . Foot pain 09/04/2013  . CKD (chronic kidney disease) stage 4, GFR 15-29 ml/min (HCC) 09/04/2013  . Essential hypertension 08/23/2013  . Malnutrition of moderate degree ([place]) 08/21/2013  . Chronic diastolic heart failure ([place]) 08/15/2013  . Anemia of chronic disease 08/15/2013  . Hyperkalemia 08/15/2013  . Chronic total occlusion of artery of the extremities ([place]) 02/27/2013  . Dyslipidemia 01/05/2013  . Chronic renal insufficiency, stage IV (severe)- HD started 01/05/13 01/05/2013  . Diabetes mellitus type 2 with peripheral artery disease ([place]) 01/05/2013  . Peripheral vascular disease ([place]) 10/19/2011  . CAD - moderate at cath July 2012 (medical Rx) 02/08/2011  . 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 Severe hypothyroidism 02/08/2011    Palliative Care Assessment & Plan   HPI: 83 y.o. female  with past medical history of hypothyroidism, hypertension, chronic kidney disease stage IV, congestive heart failure with EF of  30%-35% , peripheral vascular disease status post left BKA, and prior stroke who presented to the ED with progressive dyspnea and increasing lower extremity edema and was admitted on 04/18/2016 with acute systolic CHF. She had been vomiting a few days before admission and had not been able to get her normal medications. On admission she was also noted to have TSH 261, T4 0.41, and had been reportedly not been taking Levothyroxine. At baseline pt is predominantly in bed or in a wheelchair, she lives with her son who manages her medication.   Assessment: I met with Mrs. Vaccarella's family on 1/31 for a goals of care conversation. Please see my initial consult note for full details. In brief,  the family felt comfort was the priority but still wanted to pursue full scope of interventions up to cardiac or respiratory failure. She was made DNR. On 2/1 her creatinine was noted to be elevated/rising. Nephrology was called to weigh-in, and they felt there was no AKI and it was instead her known CKD with a baseline creatinine 2.5-4.   Today, she remains comfortable in bed. Of note, there has been no recorded BM since prior to admission, and the patient cannot recall one. Bowel sounds are active and belly is soft. No medications have been administered yet this morning, however yesterday there was no recorded issues with oral administration.   Recommendations/Plan:  DNR; continue to treat what is treatable  Plan for discharge back home with [facility] and Palliative Care; [name] helps with all of her ADLs and manages her medications  Spoke with Pharmacist, plan to discuss med administration with [name] closer to discharge (what can be crushed or dissolved in water, how to take Synthroid, etc.)  Symptoms: Will give dulcolax PR once and start BID senna  Goals of Care and Additional Recommendations:  Limitations on Scope of Treatment: Full Scope Treatment up to DNR.   Code Status:  DNR  Prognosis:   Unable to  determine  Discharge Planning:  Home with [facility] and palliative care  Care plan was discussed with pt (though likely limited understanding).  Thank you for allowing the Palliative Medicine Team to assist in the care of this patient.  Total time: 15 minutes    Greater than 50%  of this time was spent counseling and coordinating care related to the above assessment and plan.  Charlynn Court, NP Palliative Medicine Team [phone] pager (7a-5p) Team Phone # [phone]

## 2016-04-22 NOTE — Care Management Important Message (Signed)
Important Message  Patient Details  Name: [name] MRN: [id number] Date of Birth: [birth year]   Medicare Important Message Given:  Yes    Zamarian Scarano Abena 04/22/2016, 1:53 PM

## 2016-04-22 NOTE — Care Management Note (Signed)
Case Management Note Donn Pierini RN, BSN Unit 2W-Case Manager [phone]  Patient Details  Name: [name] MRN: [id number] Date of Birth: February 03, 1924  Subjective/Objective:  Pt admitted with severe hypothyroidism                    Action/Plan: PTA pt lived at home with son/family- son reports has all needed DME at home. Per PT eval no recommendations for [facility] services. Pt to return home with son.   Expected Discharge Date:  04/22/16               Expected Discharge Plan:  Home w Home Health Services  In-House Referral:  Hospice / Palliative Care  Discharge planning Services  CM Consult  Post Acute Care Choice:  NA Choice offered to:  NA  DME Arranged:    DME Agency:     HH Arranged:    HH Agency:     Status of Service:  Completed, signed off  If discussed at Long Length of Stay Meetings, dates discussed:    Discharge Disposition: home/self care   Additional Comments:  Darrold Span, RN 04/22/2016, 11:26 AM

## 2016-04-23 ENCOUNTER — Telehealth: Payer: Self-pay | Admitting: *Deleted

## 2016-04-23 LAB — BASIC METABOLIC PANEL
ANION GAP: 11 (ref 5–15)
BUN: 25 mg/dL — AB (ref 6–20)
CHLORIDE: 104 mmol/L (ref 101–111)
CO2: 23 mmol/L (ref 22–32)
Calcium: 9.3 mg/dL (ref 8.9–10.3)
Creatinine, Ser: 2.66 mg/dL — ABNORMAL HIGH (ref 0.44–1.00)
GFR calc non Af Amer: 15 mL/min — ABNORMAL LOW (ref >60)
GFR, EST AFRICAN AMERICAN: 17 mL/min — AB (ref >60)
Glucose, Bld: 163 mg/dL — ABNORMAL HIGH (ref 65–99)
POTASSIUM: 4.8 mmol/L (ref 3.5–5.1)
SODIUM: 138 mmol/L (ref 135–145)

## 2016-04-23 NOTE — Progress Notes (Addendum)
Patient was seen and examined at bedside. Patient is clinically stable. She is alert awake and talking. No new complaints. Son at bedside. Discharge orders and medications reconciled was done yesterday. Patient did not leave yesterday because of transportation issue and voiding after foley catheter removal. Patient is able to void now without any difficulties.  Discussed with the patient's son regarding importance of follow-up with cardiologist, nephrologist and PCP. I also discussed with the patient's nurse about discharge.   Please see discharge summary from yesterday for details.

## 2016-04-23 NOTE — Telephone Encounter (Signed)
Reviewed AVS and answered all questions for son. No further CM needs.

## 2016-04-23 NOTE — Care Management Note (Signed)
Case Management Note  Patient Details  Name: KAO MCKEONE MRN: [id number] Date of Birth: 01-16-1924  Subjective/Objective: Received call from RN that pt was ready to discharge to home today via PTAR. Confirmed address and completed Medical necessity form and placed on Chart for PTAR. PTAR unable to give                     Action/Plan: No further CM needs as Son who is Primary caregiver confirms no DME or HH needs and confirms address as correct on Facesheet. Will sign off for now but will be available should additional needs arise   Expected Discharge Date:  04/22/16               Expected Discharge Plan:  Home w Home Health Services  In-House Referral:  Hospice / Palliative Care  Discharge planning Services  CM Consult  Post Acute Care Choice:  NA Choice offered to:  NA  DME Arranged:    DME Agency:     HH Arranged:    HH Agency:     Status of Service:  Completed, signed off  If discussed at [employer], dates discussed:    Additional Comments:  Yvone Neu, RN 04/23/2016, 9:50 AM

## 2016-04-23 NOTE — Progress Notes (Signed)
Order received to discharge patient.  Telemetry monitor removed and CCMD notified.  PIV access removed.  Discharge instructions, follow up, medications and instructions for their use discussed with patient and her son.  PTAR arranged for transfer home.

## 2016-04-25 ENCOUNTER — Telehealth: Payer: Self-pay | Admitting: Internal Medicine

## 2016-04-25 NOTE — Telephone Encounter (Signed)
Pt's son calling re pt's BP being 109/67 this am about 830a, wants to know if he needs to give her her BP med this am or not-pls call [phone]

## 2016-04-25 NOTE — Telephone Encounter (Signed)
Left msg to call.

## 2016-04-27 NOTE — Telephone Encounter (Signed)
LEFT MESSAGE TO CALL BACK - IF WE CAN STILL BE ANY ASSISTANCE- MAY SPEAK TO ANY TRIAGE ENURSE

## 2016-04-28 NOTE — Telephone Encounter (Signed)
Patient/son has not returned call. Encounter closed.

## 2016-05-20 ENCOUNTER — Ambulatory Visit: Payer: Medicare Other | Admitting: Internal Medicine

## 2016-05-31 ENCOUNTER — Encounter (INDEPENDENT_AMBULATORY_CARE_PROVIDER_SITE_OTHER): Payer: Medicare Other | Admitting: Podiatry

## 2016-05-31 NOTE — Progress Notes (Signed)
This encounter was created in error - please disregard.

## 2016-06-07 ENCOUNTER — Ambulatory Visit (INDEPENDENT_AMBULATORY_CARE_PROVIDER_SITE_OTHER): Payer: Medicare Other | Admitting: Internal Medicine

## 2016-06-07 VITALS — BP 171/70 | HR 70 | Ht <= 58 in

## 2016-06-07 DIAGNOSIS — N186 End stage renal disease: Secondary | ICD-10-CM

## 2016-06-07 DIAGNOSIS — I5043 Acute on chronic combined systolic (congestive) and diastolic (congestive) heart failure: Secondary | ICD-10-CM | POA: Diagnosis not present

## 2016-06-07 DIAGNOSIS — M10311 Gout due to renal impairment, right shoulder: Secondary | ICD-10-CM | POA: Diagnosis not present

## 2016-06-07 MED ORDER — COLCHICINE 0.6 MG PO TABS: ORAL_TABLET | ORAL | 0 refills | Status: DC

## 2016-06-07 NOTE — Patient Instructions (Signed)
Medication Instructions: Please take 2 tablets of colchicine today 3/20 and then one tablet daily until symptoms resolve.  Follow-Up: Your physician recommends that you schedule a follow-up appointment in: 3 MONTHS WITH DR. HILTY   If you need a refill on your cardiac medications before your next appointment, please call your pharmacy.

## 2016-06-07 NOTE — Progress Notes (Signed)
OFFICE NOTE  Chief Complaint:  Right shoulder pain  Primary Care Physician: Georgianne Fick, MD  HPI:  [name] is a pleasant 83 year old female with an unfortunate history of heart failure in the past due to severe hypothyroidism. EF has subsequently normalized. However, she does have significant peripheral arterial disease; underwent a left BKA. When I saw her last on December 13, she was having problems with rest pain in her right foot, and discoloration of the sole of her foot and toes, with tenderness to palpation. I had prescribed her for pain medicine and had reviewed her lower extremity arteriogram with Dr. Allyson Sabal, and felt that there were very few options for revascularization. She returned today and has reported some improvement with reduction in pain, and there does appear to be less discoloration. However, pulses still remain very weak. Her family reports she's also had a mild cough and does seem a little bit more fatigued. She did not report any worsening shortness of breath or orthopnea but has had some difficulty transferring. At her last office visit, her JVP is elevated and there was some trace right lower extremity edema. She did have basilar crackles and appeared to be volume overloaded. I recommended increasing her diuretics and ordered a BMP and BNP. She was to return to the office in 2 weeks for reassessment. Unfortunately she never made that appointment - ultimately I recheck some laboratory work and she was noted to have progressive renal failure. She was ultimately brought to the ED with complaints of worsening SOB and Wheezing. EMS was called to her home and found her O2 sats to be 70% and she was placed on supplemental O2, and had mild improvement. She was found to have findings of CHF, however, her EF on echo was 65-70%. She has acute on chronic renal failure - her fistula could not be accessed and a temporary dialysis catheter was placed. She was dialyzed while in  the hospital and was discharged to rehabilitation. She has not had any followup dialysis and is supposed to be getting weekly test of her renal function being sent to her nephrologist. I do not see where she has an appointment with her nephrologist.  ShannonNelson returns today after a number of recent hospitalizations. In October she underwent fistula placement this was complicated by swelling of the arm and it was felt the fistula had to be closed. She now has little if any vascular access. She was then hospitalized for epiglottitis and treated with antibiotics. She continues to be wheezy. All of her wheezes upper airway. Weight is fortunately been stable she's denied any chest pain or worsening heart failure symptoms.   I saw [name] back in the office today. Overall she is without any complaints. She continues to have very poor renal function with a GFR between 15 and 20. She has no dialysis access. She's not had any end-of-life discussions and I briefly spoke with her today about that. She may wish to consider her options, especially DO NOT RESUSCITATE if she has no dialysis access. Given her age and comorbidities this is a very realistic thing to think about.  09/18/2015  [name] returns today for follow-up. Overall she is doing fairly well. She did have a fistula but is not yet on dialysis. Her quality of life is fairly sedentary. An EKG was performed today due to bradycardia and she was noted to be in a junctional rhythm at 48. She is on carvedilol 6.25 mg twice a day due  to her cardiomyopathy. 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  10/07/2015  [name] returns today for follow-up. He reports that she was seen in the ER and was noted to be markedly hypertensive with abdominal discomfort, nausea and vomiting. No adjustments were made to her blood pressure medicines. Troponin was negative however EKG showed new  anterolateral T-wave inversions. In addition her QT interval was prolonged greater than 550 ms. I repeated her EKG today and shows a normal QTC of 453 ms however there are still mild anterolateral T-wave changes. She denies any chest pain. Her appetite has improved. Blood pressure still remains elevated today at 184/87. Heart rate has improved after discontinuing carvedilol as she was noted to be in a junctional rhythm in the 40s. Going forward I would avoid AV nodal blocking medications.  06/07/2016  [name] was seen today in follow-up from her hospitalization. Unfortunate she was quite ill and presented an acute systolic congestive heart failure. EF was found to be again reduced around 30%. She has not been compliant with her thyroid medication and her TSH in the hospital was 261. She has since been restarted on thyroid medicine. She was diuresed aggressively. She was made a DO NOT RESUSCITATE and I think that's appropriate given her age. She did not want to pursue palliative care. Today in follow-up she is doing fairly well. He seems that she is keeping her edema off. Her main complaint is right shoulder pain. Blood pressure was elevated today, possibly related to the pain.  PMHx:  Past Medical History:  Diagnosis Date  . Abnormal nuclear stress test 06/01/09   Demonstrated a new area of infarct scar, peri-infarct ischemia seen in the inferolateral territory. EF eas normal at 70% with mild hypocontractility at the apex, distal inferolateral wall.  . Anemia   . Arthritis   . Cardiomyopathy, idiopathic (HCC) 02/08/2011  . Chronic diastolic CHF (congestive heart failure) (HCC)    Takes Lasix  . Chronic kidney disease (CKD), stage IV (severe) (HCC)   . Coronary artery disease    a. Cath 09/2010 - med rx.  . Diabetes mellitus    type 2 NIDDM  . Dyslipidemia   . GERD (gastroesophageal reflux disease)   . Gout    takes allopurinol  . H/O echocardiogram 09/06/11   Indication- nonIschemic  Cardiomyopathy. EF = now greater than 55% with no regional wall motion abnormalities. Tthere is mild to moderate trisuspid regurgitayion and mild pulmonary hypertension with an RVSP of 35 mmHg as well as stage 1 diastolic dysfunction and mild to moderate LVH.  Marland Kitchen Hx of transient ischemic attack (TIA)   . Hypertension   . Hypothyroidism    (SEVERE) Takes Levothryroxine  . Irregular heartbeat   . Memory loss   . Myocardial infarct    x 3 unsure of years  . Nonischemic cardiomyopathy (HCC)    EF now is 55%, reduced due to myxedema, which is improved.  . Peripheral neuropathy (HCC)   . Peripheral vascular disease (HCC)    a. s/p L BKA.  . Shingles   . Stroke (HCC)   . TIA (transient ischemic attack)   . Ulcer [facility])     Past Surgical History:  Procedure Laterality Date  . AMPUTATION  07/05/2011   Procedure: AMPUTATION BELOW KNEE;  Surgeon: Chuck Hint, MD;  Location: [facility] OR;  Service: Vascular;  Laterality: Left;  . ANGIOPLASTY  1988  .  AV FISTULA PLACEMENT    . AV FISTULA PLACEMENT Right 12/24/2013   Procedure: INSERTION OF ARTERIOVENOUS (AV) GORE-TEX GRAFT ARM;  Surgeon: Chuck Hint, MD;  Location: [facility] OR;  Service: Vascular;  Laterality: Right;  . BACK SURGERY     [facility]  . CARDIAC CATHETERIZATION  09/28/07   Demonstrated multiple sequential lesions around 40 to 30% in the RCA territory.  . Duplex doppler  05/10/11   LE arterial dopplers demonstrate bilaterally reduced ABIs of 0.91 on right & 0.56 on left. She does report some decreased pain on the left, & there's moderate mixed-density plaque in the right SFA w/50 to 69% reduction. There's a 69% reduction in the left SFA & does appear to be occlusive disease of left posterior tibial artery. Right posterior dorsalis pedis artery demonstrates occlusive disease  . ESOPHAGOGASTRODUODENOSCOPY N/A 04/15/2014   Procedure: ESOPHAGOGASTRODUODENOSCOPY (EGD);  Surgeon: Rachael Fee, MD;  Location[facility] ENDOSCOPY;   Service: Endoscopy;  Laterality: N/A;  . EYE SURGERY     Left eye surgery; cataract removal  . INSERTION OF DIALYSIS CATHETER N/A 01/06/2013   Procedure: INSERTION OF DIALYSIS CATHETER;  Surgeon: Larina Earthly, MD;  Location: [facility] OR;  Service: Vascular;  Laterality: N/A;  . left bka  07/05/2011  . left lower extremity venous duplex Left 06/27/11   Summary: No evidence of DVT involving the left lower extremity and right common femoral vein.   Marland Kitchen LIGATION ARTERIOVENOUS GORTEX GRAFT Right 03/25/2014   Procedure: LIGATION ARTERIOVENOUS GORTEX GRAFT;  Surgeon: Chuck Hint, MD;  Location: [facility] OR;  Service: Vascular;  Laterality: Right;  . LOWER EXTREMITY ANGIOGRAM N/A 10/31/2011   Procedure: LOWER EXTREMITY ANGIOGRAM;  Surgeon: Chuck Hint, MD;  Location: [facility] CATH LAB;  Service: Cardiovascular;  Laterality: N/A;  . Lower extremity arterial evaluation  06/27/11   SUMMARY: Right: ABI not ascertained due to false elevation in BP secondary to calcification (posterior tibial artery is non compressible). Left: ABI indicates moderate reduction in arterial flow. Bilateral: Great toe PPG waveforms indicate adequate perfusion. Great toe pressures not obtained due to patient's movements secondary to pain.  Marland Kitchen SHUNTOGRAM N/A 03/17/2014   Procedure: Betsey Amen;  Surgeon: Fransisco Hertz, MD;  Location: [facility] CATH LAB;  Service: Cardiovascular;  Laterality: N/A;    FAMHx:  Family History  Problem Relation Age of Onset  . Heart attack Daughter   . Heart disease Daughter     Before age 70  . Cancer Sister     STOMACH  . Diabetes Sister   . Cancer Brother     BONE  . Diabetes Brother   . Hyperlipidemia Daughter   . Hypertension Daughter   . Heart disease Daughter     before age 3  . Kidney disease Daughter   . Other Daughter     varicose veins  . Diabetes Daughter   . Heart disease Son     before age 1  . Hyperlipidemia Son   . Hypertension Son   . Heart attack Son   . Anesthesia problems Neg  Hx   . Hypotension Neg Hx   . Malignant hyperthermia Neg Hx   . Pseudochol deficiency Neg Hx     SOCHx:   reports that she quit smoking about 29 years ago. She has a 20.00 pack-year smoking history. She has quit using smokeless tobacco. She reports that she does not drink alcohol or use drugs.  ALLERGIES:  Allergies  Allergen Reactions  . Aspirin Nausea And  Vomiting and Other (See Comments)    325 mg (adult strength) [name]     ROS: Pertinent items noted in HPI and remainder of comprehensive ROS otherwise negative.  HOME MEDS: Current Outpatient Prescriptions  Medication Sig Dispense Refill  . acetaminophen (TYLENOL) 325 MG tablet Take 650 mg by mouth every 6 (six) hours as needed for mild pain.    Marland Kitchen albuterol-ipratropium (COMBIVENT) 18-103 MCG/ACT inhaler Inhale 1-2 puffs into the lungs at bedtime as needed for wheezing or shortness of breath.     . allopurinol (ZYLOPRIM) 100 MG tablet Take 100 mg by mouth daily.     Marland Kitchen aspirin 325 MG tablet Take 1 tablet (325 mg total) by mouth daily. 30 tablet 0  . calcitRIOL (ROCALTROL) 0.25 MCG capsule Take 0.25 mcg by mouth every other day.     . Calcium Carbonate-Vitamin D (CALCIUM-VITAMIN D) 500-200 MG-UNIT tablet Take 1 tablet by mouth daily.    . Cholecalciferol (VITAMIN D PO) Take 1 capsule by mouth daily.    Marland Kitchen docusate sodium (COLACE) 100 MG capsule Take 100 mg by mouth daily as needed for mild constipation.    . famotidine (PEPCID) 20 MG tablet Take 1 tablet (20 mg total) by mouth at bedtime. (Patient taking differently: Take 20 mg by mouth at bedtime as needed for heartburn. ) 30 tablet 3  . feeding supplement (BOOST / RESOURCE BREEZE) LIQD Take 1 Container by mouth 3 (three) times daily between meals.  0  . furosemide (LASIX) 80 MG tablet Take 1 tablet (80 mg total) by mouth 2 (two) times daily. 60 tablet 0  . gabapentin (NEURONTIN) 300 MG capsule Take 1 capsule (300 mg  total) by mouth at bedtime.    . hydrALAZINE (APRESOLINE) 50 MG tablet Take 1 tablet (50 mg total) by mouth 2 (two) times daily. (Patient taking differently: Take 50 mg by mouth 3 (three) times daily. ) 30 tablet 1  . isosorbide dinitrate (ISORDIL) 20 MG tablet Take 1 tablet (20 mg total) by mouth 3 (three) times daily. 90 tablet 0  . levothyroxine (SYNTHROID, LEVOTHROID) 100 MCG tablet Take 1 tablet (100 mcg total) by mouth daily. 30 tablet 0  . metoCLOPramide (REGLAN) 5 MG tablet Take 1 tablet (5 mg total) by mouth every 6 (six) hours as needed for nausea. 120 tablet 3  . metoprolol tartrate (LOPRESSOR) 25 MG tablet Take 0.5 tablets (12.5 mg total) by mouth 2 (two) times daily. 60 tablet 0  . potassium chloride 20 MEQ/15ML (10%) SOLN Take 15 mLs (20 mEq total) by mouth daily. 200 mL 0  . pravastatin (PRAVACHOL) 40 MG tablet Take 40 mg by mouth every evening.     . sevelamer carbonate (RENVELA) 0.8 g PACK packet Take 0.8 g by mouth 3 (three) times daily with meals. 270 each 0  . sodium bicarbonate 650 MG tablet Take 1 tablet (650 mg total) by mouth daily. 30 tablet 0  . colchicine 0.6 MG tablet Take two tablets by mouth 06/07/16 and then take one tablet daily until symptoms resolve. 10 tablet 0   No current facility-administered medications for this visit.     LABS/IMAGING: No results found for this or any previous visit (from the past 48 hour(s)). No results found.  VITALS: BP (!) 171/70   Pulse 70   Ht 4\' 9"  (1.448 m)   EXAM: General appearance: alert and no distress Neck: JVD flat, no carotid bruits, upper airway wheeze  Lungs: diminished breath sounds bilaterally, faint basilar crackles Heart: regular rate and rhythm Abdomen: soft, mildly protuberant Extremities: no edema on the right leg, left is BKA, right shoulder girdle is sore with extension, external rotation and noted to be warm to the touch. Pulses: faint pulse in the right foot Skin: Skin color, texture, turgor normal. No  rashes or lesions Neurologic: Mental status: Alert, oriented, thought content appropriate  EKG: Normal sinus rhythm at 70, T-wave changes anterolaterally which is unchanged  ASSESSMENT: 1. Acute right shoulder inflammatory arthritis-possibly gout 2. Symptomatic junctional bradycardia - HR improved off of b-blocker 3. Recent acute on chronic systolic heart failure 4. Acute on chronic kidney disease stage V-hemodialysis catheter in place 5. PAD with left BKA (wheelchair bound) 6. History of severe hypothyroidism with non-ischemic cardiomyopathy, EF improved to 65-70% 7. ESRD, not on HD - fistula in place  PLAN: 1.   Mrs. Eddington seems to be recovering from recent acute on chronic systolic congestive heart failure. I would keep her on her current dose of diuretics. She is complaining of right shoulder pain which is warm and difficult to move without pain. This is suggestive of inflammatory arthritis and she does have a history of gout. I advised week start colchicine with a 1.2 mg dose and then 0.6 mg daily for 6 more days or until her pain resolves. She should have follow-up with her PCP which is actually scheduled in 1-2 weeks.  Follow-up with me in 3 months.  Chrystie Nose, MD, [facility] Attending Cardiologist [facility]  Chrystie Nose 06/07/2016, 5:39 PM

## 2016-06-09 ENCOUNTER — Telehealth: Payer: Self-pay | Admitting: Internal Medicine

## 2016-06-09 NOTE — Telephone Encounter (Signed)
Plavix stopped several admissions ago - would not refill.  Dr. Rexene Edison

## 2016-06-09 NOTE — Telephone Encounter (Signed)
Plavix is not on current medication list, it was d/c'd  Order Status: Discontinued  Ordering User: Rodolph Bong, MD Ordering Date/Time: Wed Nov 11, 2015 1649  Ordering Provider: Rodolph Bong, MD Authorizing Provider: Rodolph Bong, MD  D/C User: Automatic Discharge Provider D/C Date/Time: Fri Nov 13, 2015 1634   Should we refill? Please advise

## 2016-06-09 NOTE — Telephone Encounter (Signed)
Pt husband notified he will not continue. He states that he was under the impression that since she had another stroke she should re-start.   I looked in pas notes looks like she did have something 12-28-15 admission buit D/C note says:  Continue aspirin daily; per neurology okay to continue aspirin instead of plavix.

## 2016-06-09 NOTE — Telephone Encounter (Signed)
New message       *STAT* If patient is at the pharmacy, call can be transferred to refill team.   1. Which medications need to be refilled? (please list name of each medication and dose if known) plavix 2. Which pharmacy/location (including street and city if local pharmacy) is medication to be sent to? [facility] at [address] 3. Do they need a 30 day or 90 day supply? 30 Pt is out of medication

## 2016-08-12 ENCOUNTER — Emergency Department (HOSPITAL_COMMUNITY): Payer: Medicare Other

## 2016-08-12 ENCOUNTER — Encounter (HOSPITAL_COMMUNITY): Payer: Self-pay | Admitting: Nurse Practitioner

## 2016-08-12 ENCOUNTER — Inpatient Hospital Stay (HOSPITAL_COMMUNITY)
Admission: EM | Admit: 2016-08-12 | Discharge: 2016-08-16 | DRG: 065 | Disposition: A | Discharge status: Home Health | Payer: Medicare Other | Attending: Family Medicine | Admitting: Family Medicine

## 2016-08-12 ENCOUNTER — Observation Stay (HOSPITAL_COMMUNITY): Payer: Medicare Other

## 2016-08-12 DIAGNOSIS — R4781 Slurred speech: Secondary | ICD-10-CM | POA: Diagnosis present

## 2016-08-12 DIAGNOSIS — E039 Hypothyroidism, unspecified: Secondary | ICD-10-CM | POA: Diagnosis present

## 2016-08-12 DIAGNOSIS — E860 Dehydration: Secondary | ICD-10-CM | POA: Diagnosis present

## 2016-08-12 DIAGNOSIS — Z886 Allergy status to analgesic agent status: Secondary | ICD-10-CM

## 2016-08-12 DIAGNOSIS — I252 Old myocardial infarction: Secondary | ICD-10-CM

## 2016-08-12 DIAGNOSIS — R05 Cough: Secondary | ICD-10-CM

## 2016-08-12 DIAGNOSIS — I639 Cerebral infarction, unspecified: Secondary | ICD-10-CM | POA: Diagnosis not present

## 2016-08-12 DIAGNOSIS — I5032 Chronic diastolic (congestive) heart failure: Secondary | ICD-10-CM | POA: Diagnosis present

## 2016-08-12 DIAGNOSIS — G8929 Other chronic pain: Secondary | ICD-10-CM | POA: Diagnosis present

## 2016-08-12 DIAGNOSIS — Z87891 Personal history of nicotine dependence: Secondary | ICD-10-CM

## 2016-08-12 DIAGNOSIS — I130 Hypertensive heart and chronic kidney disease with heart failure and stage 1 through stage 4 chronic kidney disease, or unspecified chronic kidney disease: Secondary | ICD-10-CM | POA: Diagnosis present

## 2016-08-12 DIAGNOSIS — Z66 Do not resuscitate: Secondary | ICD-10-CM | POA: Diagnosis present

## 2016-08-12 DIAGNOSIS — Z9842 Cataract extraction status, left eye: Secondary | ICD-10-CM

## 2016-08-12 DIAGNOSIS — Z8673 Personal history of transient ischemic attack (TIA), and cerebral infarction without residual deficits: Secondary | ICD-10-CM

## 2016-08-12 DIAGNOSIS — I2510 Atherosclerotic heart disease of native coronary artery without angina pectoris: Secondary | ICD-10-CM | POA: Diagnosis present

## 2016-08-12 DIAGNOSIS — Z8249 Family history of ischemic heart disease and other diseases of the circulatory system: Secondary | ICD-10-CM

## 2016-08-12 DIAGNOSIS — R4701 Aphasia: Secondary | ICD-10-CM | POA: Diagnosis present

## 2016-08-12 DIAGNOSIS — Z89512 Acquired absence of left leg below knee: Secondary | ICD-10-CM

## 2016-08-12 DIAGNOSIS — I739 Peripheral vascular disease, unspecified: Secondary | ICD-10-CM | POA: Diagnosis present

## 2016-08-12 DIAGNOSIS — G459 Transient cerebral ischemic attack, unspecified: Secondary | ICD-10-CM

## 2016-08-12 DIAGNOSIS — I110 Hypertensive heart disease with heart failure: Secondary | ICD-10-CM

## 2016-08-12 DIAGNOSIS — Z79899 Other long term (current) drug therapy: Secondary | ICD-10-CM

## 2016-08-12 DIAGNOSIS — E1151 Type 2 diabetes mellitus with diabetic peripheral angiopathy without gangrene: Secondary | ICD-10-CM | POA: Diagnosis present

## 2016-08-12 DIAGNOSIS — I10 Essential (primary) hypertension: Secondary | ICD-10-CM | POA: Diagnosis present

## 2016-08-12 DIAGNOSIS — E1122 Type 2 diabetes mellitus with diabetic chronic kidney disease: Secondary | ICD-10-CM | POA: Diagnosis present

## 2016-08-12 DIAGNOSIS — Z7401 Bed confinement status: Secondary | ICD-10-CM

## 2016-08-12 DIAGNOSIS — K5900 Constipation, unspecified: Secondary | ICD-10-CM | POA: Diagnosis present

## 2016-08-12 DIAGNOSIS — Z7982 Long term (current) use of aspirin: Secondary | ICD-10-CM

## 2016-08-12 DIAGNOSIS — M109 Gout, unspecified: Secondary | ICD-10-CM | POA: Diagnosis present

## 2016-08-12 DIAGNOSIS — N184 Chronic kidney disease, stage 4 (severe): Secondary | ICD-10-CM | POA: Diagnosis present

## 2016-08-12 DIAGNOSIS — I429 Cardiomyopathy, unspecified: Secondary | ICD-10-CM | POA: Diagnosis present

## 2016-08-12 DIAGNOSIS — E785 Hyperlipidemia, unspecified: Secondary | ICD-10-CM | POA: Diagnosis present

## 2016-08-12 DIAGNOSIS — R059 Cough, unspecified: Secondary | ICD-10-CM

## 2016-08-12 DIAGNOSIS — K219 Gastro-esophageal reflux disease without esophagitis: Secondary | ICD-10-CM | POA: Diagnosis present

## 2016-08-12 DIAGNOSIS — Z515 Encounter for palliative care: Secondary | ICD-10-CM | POA: Diagnosis present

## 2016-08-12 LAB — URINALYSIS, ROUTINE W REFLEX MICROSCOPIC
Bilirubin Urine: NEGATIVE
Glucose, UA: NEGATIVE mg/dL
Hgb urine dipstick: NEGATIVE
Ketones, ur: NEGATIVE mg/dL
Leukocytes, UA: NEGATIVE
NITRITE: NEGATIVE
PH: 5 (ref 5.0–8.0)
Protein, ur: NEGATIVE mg/dL
SPECIFIC GRAVITY, URINE: 1.006 (ref 1.005–1.030)

## 2016-08-12 LAB — COMPREHENSIVE METABOLIC PANEL
ALBUMIN: 3.1 g/dL — AB (ref 3.5–5.0)
ALK PHOS: 102 U/L (ref 38–126)
ALT: 20 U/L (ref 14–54)
AST: 29 U/L (ref 15–41)
Anion gap: 9 (ref 5–15)
BUN: 46 mg/dL — ABNORMAL HIGH (ref 6–20)
CALCIUM: 8.9 mg/dL (ref 8.9–10.3)
CHLORIDE: 109 mmol/L (ref 101–111)
CO2: 23 mmol/L (ref 22–32)
CREATININE: 2.89 mg/dL — AB (ref 0.44–1.00)
GFR calc non Af Amer: 13 mL/min — ABNORMAL LOW (ref >60)
GFR, EST AFRICAN AMERICAN: 15 mL/min — AB (ref >60)
GLUCOSE: 162 mg/dL — AB (ref 65–99)
Potassium: 4.1 mmol/L (ref 3.5–5.1)
SODIUM: 141 mmol/L (ref 135–145)
Total Bilirubin: 0.6 mg/dL (ref 0.3–1.2)
Total Protein: 6.5 g/dL (ref 6.5–8.1)

## 2016-08-12 LAB — DIFFERENTIAL
BASOS ABS: 0 10*3/uL (ref 0.0–0.1)
BASOS PCT: 0 %
Eosinophils Absolute: 0.3 10*3/uL (ref 0.0–0.7)
Eosinophils Relative: 4 %
LYMPHS PCT: 26 %
Lymphs Abs: 2.2 10*3/uL (ref 0.7–4.0)
Monocytes Absolute: 0.8 10*3/uL (ref 0.1–1.0)
Monocytes Relative: 10 %
NEUTROS ABS: 5.2 10*3/uL (ref 1.7–7.7)
Neutrophils Relative %: 60 %

## 2016-08-12 LAB — I-STAT CHEM 8, ED
BUN: 49 mg/dL — AB (ref 6–20)
CALCIUM ION: 1.19 mmol/L (ref 1.15–1.40)
Chloride: 110 mmol/L (ref 101–111)
Creatinine, Ser: 3 mg/dL — ABNORMAL HIGH (ref 0.44–1.00)
GLUCOSE: 157 mg/dL — AB (ref 65–99)
HCT: 33 % — ABNORMAL LOW (ref 36.0–46.0)
Hemoglobin: 11.2 g/dL — ABNORMAL LOW (ref 12.0–15.0)
POTASSIUM: 4.1 mmol/L (ref 3.5–5.1)
SODIUM: 142 mmol/L (ref 135–145)
TCO2: 24 mmol/L (ref 0–100)

## 2016-08-12 LAB — APTT: APTT: 36 s (ref 24–36)

## 2016-08-12 LAB — CBG MONITORING, ED: GLUCOSE-CAPILLARY: 145 mg/dL — AB (ref 65–99)

## 2016-08-12 LAB — PHOSPHORUS: PHOSPHORUS: 4.1 mg/dL (ref 2.5–4.6)

## 2016-08-12 LAB — CBC
HCT: 33.6 % — ABNORMAL LOW (ref 36.0–46.0)
Hemoglobin: 10.7 g/dL — ABNORMAL LOW (ref 12.0–15.0)
MCH: 29.9 pg (ref 26.0–34.0)
MCHC: 31.8 g/dL (ref 30.0–36.0)
MCV: 93.9 fL (ref 78.0–100.0)
PLATELETS: 349 10*3/uL (ref 150–400)
RBC: 3.58 MIL/uL — AB (ref 3.87–5.11)
RDW: 14.8 % (ref 11.5–15.5)
WBC: 8.5 10*3/uL (ref 4.0–10.5)

## 2016-08-12 LAB — PROTIME-INR
INR: 1.14
PROTHROMBIN TIME: 14.6 s (ref 11.4–15.2)

## 2016-08-12 LAB — MAGNESIUM: MAGNESIUM: 1.8 mg/dL (ref 1.7–2.4)

## 2016-08-12 LAB — I-STAT TROPONIN, ED: Troponin i, poc: 0.03 ng/mL (ref 0.00–0.08)

## 2016-08-12 LAB — TROPONIN I: TROPONIN I: 0.03 ng/mL — AB (ref <0.03)

## 2016-08-12 MED ORDER — IPRATROPIUM-ALBUTEROL 18-103 MCG/ACT IN AERO: 1.0000 | INHALATION_SPRAY | Freq: Every evening | RESPIRATORY_TRACT | Status: DC | PRN

## 2016-08-12 MED ORDER — MORPHINE SULFATE (PF) 4 MG/ML IV SOLN
1.0000 mg | INTRAVENOUS | Status: DC | PRN
  Filled 2016-08-12: qty 1

## 2016-08-12 MED ORDER — ACETAMINOPHEN 325 MG PO TABS: 650.0000 mg | ORAL_TABLET | ORAL | Status: DC | PRN

## 2016-08-12 MED ORDER — ACETAMINOPHEN 650 MG RE SUPP
650.0000 mg | RECTAL | Status: DC | PRN
  Administered 2016-08-12 – 2016-08-13 (×2): 650 mg via RECTAL
  Filled 2016-08-12 (×2): qty 1

## 2016-08-12 MED ORDER — ASPIRIN 300 MG RE SUPP
300.0000 mg | Freq: Every day | RECTAL | Status: DC
  Administered 2016-08-12 – 2016-08-14 (×3): 300 mg via RECTAL
  Filled 2016-08-12 (×4): qty 1

## 2016-08-12 MED ORDER — HEPARIN SODIUM (PORCINE) 5000 UNIT/ML IJ SOLN
5000.0000 [IU] | Freq: Three times a day (TID) | INTRAMUSCULAR | Status: DC
  Administered 2016-08-12 – 2016-08-16 (×11): 5000 [IU] via SUBCUTANEOUS
  Filled 2016-08-12 (×10): qty 1

## 2016-08-12 MED ORDER — METOPROLOL TARTRATE 5 MG/5ML IV SOLN
5.0000 mg | Freq: Two times a day (BID) | INTRAVENOUS | Status: DC
  Administered 2016-08-12 – 2016-08-16 (×8): 5 mg via INTRAVENOUS
  Filled 2016-08-12 (×9): qty 5

## 2016-08-12 MED ORDER — ACETAMINOPHEN 160 MG/5ML PO SOLN: 650.0000 mg | ORAL | Status: DC | PRN

## 2016-08-12 MED ORDER — SODIUM CHLORIDE 0.9 % IV SOLN
INTRAVENOUS | Status: DC
  Administered 2016-08-12 – 2016-08-15 (×2): via INTRAVENOUS

## 2016-08-12 MED ORDER — IPRATROPIUM-ALBUTEROL 0.5-2.5 (3) MG/3ML IN SOLN: 3.0000 mL | Freq: Every evening | RESPIRATORY_TRACT | Status: DC | PRN

## 2016-08-12 MED ORDER — ASPIRIN 325 MG PO TABS
325.0000 mg | ORAL_TABLET | Freq: Every day | ORAL | Status: DC
  Administered 2016-08-15 – 2016-08-16 (×2): 325 mg via ORAL
  Filled 2016-08-12 (×2): qty 1

## 2016-08-12 MED ORDER — STROKE: EARLY STAGES OF RECOVERY BOOK
Freq: Once | Status: AC
  Administered 2016-08-12: 19:00:00
  Filled 2016-08-12: qty 1

## 2016-08-12 MED ORDER — HYDRALAZINE HCL 20 MG/ML IJ SOLN
10.0000 mg | Freq: Three times a day (TID) | INTRAMUSCULAR | Status: DC | PRN
  Administered 2016-08-12 – 2016-08-16 (×6): 10 mg via INTRAVENOUS
  Filled 2016-08-12 (×6): qty 1

## 2016-08-12 NOTE — ED Notes (Signed)
Pt transported to MRI 

## 2016-08-12 NOTE — ED Notes (Signed)
Pt stated she had to use restroom. Placed pt on bedpan.

## 2016-08-12 NOTE — ED Notes (Signed)
Pt to xray

## 2016-08-12 NOTE — ED Notes (Signed)
Pt returned from MRI °

## 2016-08-12 NOTE — ED Notes (Addendum)
Pt refused in and out cath, nurse notified. Pt placed on bedpan. Pt was unable to void.

## 2016-08-12 NOTE — Progress Notes (Signed)
Pt arrived to 5M13 via stretcher.  Pt alert in no apparent distress.  VSS.  Telemetry applied and CCMD notified.  Will continue to monitor.  Sondra Come, RN

## 2016-08-12 NOTE — ED Provider Notes (Signed)
[facility]-EMERGENCY DEPT Provider Note   CSN: [id number] Arrival date & time: 08/12/16  1011     History   Chief Complaint Chief Complaint  Patient presents with  . Aphasia    HPI [name] is a 83 y.o. female.  HPI [name] is a 83 y.o. female with history of CHF, coronary disease, chronic kidney disease, diabetes, CVA, presents to emergency department complaining of possible left facial droop and slurred speech. Patient lives at home with her son. Patient's son states that he noticed this morning the patient's left face was drooping and her speech was slurred, she was also having trouble with words. He states he last saw patient around 3 AM when she was normal. He believes her symptoms may be improving. No treatment prior to coming in.  Past Medical History:  Diagnosis Date  . Abnormal nuclear stress test 06/01/09   Demonstrated a new area of infarct scar, peri-infarct ischemia seen in the inferolateral territory. EF eas normal at 70% with mild hypocontractility at the apex, distal inferolateral wall.  . Anemia   . Arthritis   . Cardiomyopathy, idiopathic (HCC) 02/08/2011  . Chronic diastolic CHF (congestive heart failure) (HCC)    Takes Lasix  . Chronic kidney disease (CKD), stage IV (severe) (HCC)   . Coronary artery disease    a. Cath 09/2010 - med rx.  . Diabetes mellitus    type 2 NIDDM  . Dyslipidemia   . GERD (gastroesophageal reflux disease)   . Gout    takes allopurinol  . H/O echocardiogram 09/06/11   Indication- nonIschemic Cardiomyopathy. EF = now greater than 55% with no regional wall motion abnormalities. Tthere is mild to moderate trisuspid regurgitayion and mild pulmonary hypertension with an RVSP of 35 mmHg as well as stage 1 diastolic dysfunction and mild to moderate LVH.  Marland Kitchen Hx of transient ischemic attack (TIA)   . Hypertension   . Hypothyroidism    (SEVERE) Takes Levothryroxine  . Irregular heartbeat   . Memory loss   . Myocardial infarct  (HCC)    x 3 unsure of years  . Nonischemic cardiomyopathy (HCC)    EF now is 55%, reduced due to myxedema, which is improved.  . Peripheral neuropathy   . Peripheral vascular disease (HCC)    a. s/p L BKA.  . Shingles   . Stroke (HCC)   . TIA (transient ischemic attack)   . Ulcer     Patient Active Problem List   Diagnosis Date Noted  . Acute gout due to renal impairment involving right shoulder 06/07/2016  . S/P AKA (above knee amputation) unilateral, left (HCC)   . CHF (congestive heart failure) (HCC) 04/18/2016  . Respiratory distress   . Acute on chronic systolic congestive heart failure (HCC) 01/16/2016  . Palliative care encounter   . Altered mental status   . Goals of care, counseling/discussion   . Palliative care by specialist   . Aphasia following other cerebrovascular disease   . Hemi-neglect of right side   . Cerebral thrombosis with cerebral infarction 12/30/2015  . Cerebrovascular accident (CVA) involving left middle cerebral artery territory [facility])   . Altered mental state 12/29/2015  . Acute right-sided weakness 12/29/2015  . Dysphagia   . FTT (failure to thrive) in adult 11/08/2015  . Nausea & vomiting 11/08/2015  . Metabolic acidosis 11/08/2015  . End stage renal disease (HCC) 10/07/2015  . Hypertensive heart disease with heart failure (HCC) 10/07/2015  . Junctional bradycardia 09/18/2015  .  Atypical chest pain 06/23/2014  . Neck pain   . Food impaction of esophagus   . Epiglottitis 04/13/2014  . Wound disruption, post-op, skin 02/05/2014  . Pre-operative cardiovascular examination 12/13/2013  . Foot pain 09/04/2013  . CKD (chronic kidney disease) stage 4, GFR 15-29 ml/min (HCC) 09/04/2013  . Essential hypertension 08/23/2013  . Malnutrition of moderate degree (HCC) 08/21/2013  . Chronic diastolic heart failure (HCC) 08/15/2013  . Anemia of chronic disease 08/15/2013  . Hyperkalemia 08/15/2013  . Chronic total occlusion of artery of the extremities  (HCC) 02/27/2013  . Dyslipidemia 01/05/2013  . Chronic renal insufficiency, stage IV (severe)- HD started 01/05/13 01/05/2013  . Diabetes mellitus type 2 with peripheral artery disease (HCC) 01/05/2013  . Peripheral vascular disease (HCC) 10/19/2011  . CAD - moderate at cath July 2012 (medical Rx) 02/08/2011  . 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 Severe hypothyroidism 02/08/2011    Past Surgical History:  Procedure Laterality Date  . AMPUTATION  07/05/2011   Procedure: AMPUTATION BELOW KNEE;  Surgeon: Chuck Hint, MD;  Location: [facility] OR;  Service: Vascular;  Laterality: Left;  . ANGIOPLASTY  1988  . AV FISTULA PLACEMENT    . AV FISTULA PLACEMENT Right 12/24/2013   Procedure: INSERTION OF ARTERIOVENOUS (AV) GORE-TEX GRAFT ARM;  Surgeon: Chuck Hint, MD;  Location: [facility] OR;  Service: Vascular;  Laterality: Right;  . BACK SURGERY     [facility]  . CARDIAC CATHETERIZATION  09/28/07   Demonstrated multiple sequential lesions around 40 to 30% in the RCA territory.  . Duplex doppler  05/10/11   LE arterial dopplers demonstrate bilaterally reduced ABIs of 0.91 on right & 0.56 on left. She does report some decreased pain on the left, & there's moderate mixed-density plaque in the right SFA w/50 to 69% reduction. There's a 69% reduction in the left SFA & does appear to be occlusive disease of left posterior tibial artery. Right posterior dorsalis pedis artery demonstrates occlusive disease  . ESOPHAGOGASTRODUODENOSCOPY N/A 04/15/2014   Procedure: ESOPHAGOGASTRODUODENOSCOPY (EGD);  Surgeon: Rachael Fee, MD;  Location: [facility] ENDOSCOPY;  Service: Endoscopy;  Laterality: N/A;  . EYE SURGERY     Left eye surgery; cataract removal  . INSERTION OF DIALYSIS CATHETER N/A 01/06/2013   Procedure: INSERTION OF DIALYSIS CATHETER;  Surgeon: Larina Earthly, MD;  Location: [facility] OR;  Service: Vascular;  Laterality: N/A;  . left bka  07/05/2011  . left lower extremity  venous duplex Left 06/27/11   Summary: No evidence of DVT involving the left lower extremity and right common femoral vein.   Marland Kitchen LIGATION ARTERIOVENOUS GORTEX GRAFT Right 03/25/2014   Procedure: LIGATION ARTERIOVENOUS GORTEX GRAFT;  Surgeon: Chuck Hint, MD;  Location: [facility] OR;  Service: Vascular;  Laterality: Right;  . LOWER EXTREMITY ANGIOGRAM N/A 10/31/2011   Procedure: LOWER EXTREMITY ANGIOGRAM;  Surgeon: Chuck Hint, MD;  Location: [facility] CATH LAB;  Service: Cardiovascular;  Laterality: N/A;  . Lower extremity arterial evaluation  06/27/11   SUMMARY: Right: ABI not ascertained due to false elevation in BP secondary to calcification (posterior tibial artery is non compressible). Left: ABI indicates moderate reduction in arterial flow. Bilateral: Great toe PPG waveforms indicate adequate perfusion. Great toe pressures not obtained due to patient's movements secondary to pain.  Marland Kitchen SHUNTOGRAM N/A 03/17/2014   Procedure: Betsey Amen;  Surgeon: Fransisco Hertz, MD;  Location: [facility] CATH LAB;  Service: Cardiovascular;  Laterality: N/A;    OB History  No data available       Home Medications    Prior to Admission medications   Medication Sig Start Date End Date Taking? Authorizing Provider  acetaminophen (TYLENOL) 325 MG tablet Take 650 mg by mouth every 6 (six) hours as needed for mild pain.    [provider]  albuterol-ipratropium (COMBIVENT) 18-103 MCG/ACT inhaler Inhale 1-2 puffs into the lungs at bedtime as needed for wheezing or shortness of breath.     [provider]  allopurinol (ZYLOPRIM) 100 MG tablet Take 100 mg by mouth daily.     [provider]  aspirin 325 MG tablet Take 1 tablet (325 mg total) by mouth daily. 01/04/16   Alison Murray, MD  calcitRIOL (ROCALTROL) 0.25 MCG capsule Take 0.25 mcg by mouth every other day.  11/04/12   [provider]  Calcium Carbonate-Vitamin D (CALCIUM-VITAMIN D) 500-200 MG-UNIT tablet Take 1 tablet by mouth  daily.    [provider]  Cholecalciferol (VITAMIN D PO) Take 1 capsule by mouth daily.    [provider]  colchicine 0.6 MG tablet Take two tablets by mouth 06/07/16 and then take one tablet daily until symptoms resolve. 06/07/16   Hilty, Lisette Abu, MD  docusate sodium (COLACE) 100 MG capsule Take 100 mg by mouth daily as needed for mild constipation.    [provider]  famotidine (PEPCID) 20 MG tablet Take 1 tablet (20 mg total) by mouth at bedtime. Patient taking differently: Take 20 mg by mouth at bedtime as needed for heartburn.  11/13/15   Rodolph Bong, MD  feeding supplement (BOOST / RESOURCE BREEZE) LIQD Take 1 Container by mouth 3 (three) times daily between meals. 11/13/15   Rodolph Bong, MD  furosemide (LASIX) 80 MG tablet Take 1 tablet (80 mg total) by mouth 2 (two) times daily. 04/22/16   Maxie Barb, MD  gabapentin (NEURONTIN) 300 MG capsule Take 1 capsule (300 mg total) by mouth at bedtime. 04/22/16   Maxie Barb, MD  hydrALAZINE (APRESOLINE) 50 MG tablet Take 1 tablet (50 mg total) by mouth 2 (two) times daily. Patient taking differently: Take 50 mg by mouth 3 (three) times daily.  10/22/15   Hilty, Lisette Abu, MD  isosorbide dinitrate (ISORDIL) 20 MG tablet Take 1 tablet (20 mg total) by mouth 3 (three) times daily. 04/22/16   Maxie Barb, MD  levothyroxine (SYNTHROID, LEVOTHROID) 100 MCG tablet Take 1 tablet (100 mcg total) by mouth daily. 04/22/16   Maxie Barb, MD  metoCLOPramide (REGLAN) 5 MG tablet Take 1 tablet (5 mg total) by mouth every 6 (six) hours as needed for nausea. 01/04/16   Alison Murray, MD  metoprolol tartrate (LOPRESSOR) 25 MG tablet Take 0.5 tablets (12.5 mg total) by mouth 2 (two) times daily. 04/22/16   Maxie Barb, MD  potassium chloride 20 MEQ/15ML (10%) SOLN Take 15 mLs (20 mEq total) by mouth daily. 04/22/16   Maxie Barb, MD  pravastatin (PRAVACHOL) 40 MG tablet Take 40 mg  by mouth every evening.     [provider]  sevelamer carbonate (RENVELA) 0.8 g PACK packet Take 0.8 g by mouth 3 (three) times daily with meals. 04/22/16   Maxie Barb, MD  sodium bicarbonate 650 MG tablet Take 1 tablet (650 mg total) by mouth daily. 11/13/15   Rodolph Bong, MD    Family History Family History  Problem Relation Age of Onset  . Heart attack Daughter   .  Heart disease Daughter        Before age 2  . Cancer Sister        STOMACH  . Diabetes Sister   . Cancer Brother        BONE  . Diabetes Brother   . Hyperlipidemia Daughter   . Hypertension Daughter   . Heart disease Daughter        before age 89  . Kidney disease Daughter   . Other Daughter        varicose veins  . Diabetes Daughter   . Heart disease Son        before age 36  . Hyperlipidemia Son   . Hypertension Son   . Heart attack Son   . Anesthesia problems Neg Hx   . Hypotension Neg Hx   . Malignant hyperthermia Neg Hx   . Pseudochol deficiency Neg Hx     Social History Social History  Substance Use Topics  . Smoking status: Former Smoker    Packs/day: 0.50    Years: 40.00    Quit date: 07/05/1986  . Smokeless tobacco: Former Neurosurgeon     Comment: quit smoking 1988  . Alcohol use No     Allergies   Aspirin   Review of Systems Review of Systems  Constitutional: Negative for chills and fever.  Respiratory: Negative for cough, chest tightness and shortness of breath.   Cardiovascular: Negative for chest pain, palpitations and leg swelling.  Gastrointestinal: Negative for abdominal pain, diarrhea, nausea and vomiting.  Genitourinary: Negative for dysuria and flank pain.  Musculoskeletal: Negative for arthralgias, myalgias, neck pain and neck stiffness.  Skin: Negative for rash.  Neurological: Positive for speech difficulty and weakness. Negative for dizziness and headaches.  All other systems reviewed and are negative.    Physical Exam Updated Vital Signs Ht 4'  9" (1.448 m)   Wt 50.8 kg (112 lb)   SpO2 99%   BMI 24.24 kg/m   Physical Exam  Constitutional: She is oriented to person, place, and time. She appears well-developed and well-nourished. No distress.  HENT:  Head: Normocephalic.  Eyes: Conjunctivae are normal.  Neck: Neck supple.  Cardiovascular: Normal rate, regular rhythm and normal heart sounds.   Pulmonary/Chest: Effort normal and breath sounds normal. No respiratory distress. She has no wheezes. She has no rales.  Abdominal: Soft. Bowel sounds are normal. She exhibits no distension. There is no tenderness. There is no rebound.  Musculoskeletal: She exhibits no edema.  Neurological: She is alert and oriented to person, place, and time. She displays normal reflexes. No cranial nerve deficit. She exhibits normal muscle tone. Coordination normal.  Skin: Skin is warm and dry.  Psychiatric: She has a normal mood and affect. Her behavior is normal.  Nursing note and vitals reviewed.    ED Treatments / Results  Labs (all labs ordered are listed, but only abnormal results are displayed) Labs Reviewed  CBG MONITORING, ED - Abnormal; Notable for the following:       Result Value   Glucose-Capillary 145 (*)    All other components within normal limits  PROTIME-INR  APTT  CBC  DIFFERENTIAL  COMPREHENSIVE METABOLIC PANEL  URINALYSIS, ROUTINE W REFLEX MICROSCOPIC  CBG MONITORING, ED  I-STAT TROPOININ, ED  I-STAT CHEM 8, ED    EKG  EKG Interpretation  Date/Time:  Friday Aug 12 2016 10:14:30 EDT Ventricular Rate:  59 PR Interval:    QRS Duration: 132 QT Interval:  448 QTC Calculation: 444 R  Axis:   18 Text Interpretation:  Sinus rhythm Probable left atrial enlargement Right bundle branch block Probable anteroseptal infarct, old Repol abnrm suggests ischemia, anterolateral -repolarization abnomality t wave inversion inferolateral leads present on last  ekg of 04/19/16 but appear somewhat more pronounced today.  Confirmed by RAY  MD, Duwayne Heck [id number]) on 08/12/2016 10:23:31 AM Also confirmed by RAY MD, Duwayne Heck [phone]), editor Haskel Khan, Shannon ([id number])  on 08/12/2016 10:46:47 AM       Radiology No results found.  Procedures Procedures (including critical care time)  Medications Ordered in ED Medications - No data to display   Initial Impression / Assessment and Plan / ED Course  I have reviewed the triage vital signs and the nursing notes.  Pertinent labs & imaging results that were available during my care of the patient were reviewed by me and considered in my medical decision making (see chart for details).     Patient in emergency department with left facial droop and aphasia according to her son. On my exam, symptoms have resolved, however son states that he is still not back to normal self and still having some slurred speech that he can hear. CT head, labs, will monitor.    1:41 PM Patient CT scan noted no acute findings. Labs at baseline. I discussed patient with her son again who is still concerned that she is having persistent slurred speech. Also discussed patient with a granddaughter who is an employee here and agrees.   Spoke with hospitalist, will admit.   Vitals:   08/13/16 0400 08/13/16 0600 08/13/16 1000 08/13/16 1049  BP: (!) 191/42 (!) 100/50 (!) 191/53   Pulse: 64 75    Resp: 18 18    Temp:    97.7 F (36.5 C)  TempSrc:    Oral  SpO2: 100% 99%    Weight:      Height:        Final Clinical Impressions(s) / ED Diagnoses   Final diagnoses:  Transient cerebral ischemia, unspecified type    New Prescriptions New Prescriptions   No medications on file     Jaynie Crumble, PA-C 08/13/16 1639    Margarita Grizzle, MD 08/15/16 Jerene Bears

## 2016-08-12 NOTE — H&P (Signed)
[facility] History and Physical  PHILLIS THACKERAY WGN:[id number] DOB: [birth year] DOA: 08/12/2016  Referring physician:  PCP: Georgianne Fick, MD   Chief Complaint: "Her face was drooping on the left."  HPI: [name] is a 83 y.o. female  with past history significant for anemia, appears heart failure, chronic kidney disease, coronary artery disease, diabetes, reflux, gout, hypertension, hypothyroidism presents with family with chief complaint of slurred speech. Son gives history primarily as patient has memory impairment. Her son patient has not been ill over the last few days. No sick contacts. No head injuries. Woke up this morning with slurred speech and some right-sided facial droop. This is gradually improved over the course of the day. Patient was brought to the emergency room by her son due to concern for possible stroke.  Patient denies and some confirms no fevers, chills, nausea, vomiting, dysuria, urinary frequency, rash or headache.  Review of Systems:  As per HPI otherwise 10 point review of systems negative.    Past Medical History:  Diagnosis Date  . Abnormal nuclear stress test 06/01/09   Demonstrated a new area of infarct scar, peri-infarct ischemia seen in the inferolateral territory. EF eas normal at 70% with mild hypocontractility at the apex, distal inferolateral wall.  . Anemia   . Arthritis   . Cardiomyopathy, idiopathic (HCC) 02/08/2011  . Chronic diastolic CHF (congestive heart failure) (HCC)    Takes Lasix  . Chronic kidney disease (CKD), stage IV (severe) (HCC)   . Coronary artery disease    a. Cath 09/2010 - med rx.  . Diabetes mellitus    type 2 NIDDM  . Dyslipidemia   . GERD (gastroesophageal reflux disease)   . Gout    takes allopurinol  . H/O echocardiogram 09/06/11   Indication- nonIschemic Cardiomyopathy. EF = now greater than 55% with no regional wall motion abnormalities. Tthere is mild to moderate trisuspid regurgitayion and mild  pulmonary hypertension with an RVSP of 35 mmHg as well as stage 1 diastolic dysfunction and mild to moderate LVH.  Marland Kitchen Hx of transient ischemic attack (TIA)   . Hypertension   . Hypothyroidism    (SEVERE) Takes Levothryroxine  . Irregular heartbeat   . Memory loss   . Myocardial infarct (HCC)    x 3 unsure of years  . Nonischemic cardiomyopathy (HCC)    EF now is 55%, reduced due to myxedema, which is improved.  . Peripheral neuropathy   . Peripheral vascular disease (HCC)    a. s/p L BKA.  . Shingles   . Stroke (HCC)   . TIA (transient ischemic attack)   . Ulcer    Past Surgical History:  Procedure Laterality Date  . AMPUTATION  07/05/2011   Procedure: AMPUTATION BELOW KNEE;  Surgeon: Chuck Hint, MD;  Location: [facility] OR;  Service: Vascular;  Laterality: Left;  . ANGIOPLASTY  1988  . AV FISTULA PLACEMENT    . AV FISTULA PLACEMENT Right 12/24/2013   Procedure: INSERTION OF ARTERIOVENOUS (AV) GORE-TEX GRAFT ARM;  Surgeon: Chuck Hint, MD;  Location: [facility] OR;  Service: Vascular;  Laterality: Right;  . BACK SURGERY     [facility]  . CARDIAC CATHETERIZATION  09/28/07   Demonstrated multiple sequential lesions around 40 to 30% in the RCA territory.  . Duplex doppler  05/10/11   LE arterial dopplers demonstrate bilaterally reduced ABIs of 0.91 on right & 0.56 on left. She does report some decreased pain on the left, & there's moderate mixed-density  plaque in the right SFA w/50 to 69% reduction. There's a 69% reduction in the left SFA & does appear to be occlusive disease of left posterior tibial artery. Right posterior dorsalis pedis artery demonstrates occlusive disease  . ESOPHAGOGASTRODUODENOSCOPY N/A 04/15/2014   Procedure: ESOPHAGOGASTRODUODENOSCOPY (EGD);  Surgeon: Rachael Fee, MD;  Location: [facility] ENDOSCOPY;  Service: Endoscopy;  Laterality: N/A;  . EYE SURGERY     Left eye surgery; cataract removal  . INSERTION OF DIALYSIS CATHETER N/A 01/06/2013    Procedure: INSERTION OF DIALYSIS CATHETER;  Surgeon: Larina Earthly, MD;  Location: [facility] OR;  Service: Vascular;  Laterality: N/A;  . left bka  07/05/2011  . left lower extremity venous duplex Left 06/27/11   Summary: No evidence of DVT involving the left lower extremity and right common femoral vein.   Marland Kitchen LIGATION ARTERIOVENOUS GORTEX GRAFT Right 03/25/2014   Procedure: LIGATION ARTERIOVENOUS GORTEX GRAFT;  Surgeon: Chuck Hint, MD;  Location: [facility] OR;  Service: Vascular;  Laterality: Right;  . LOWER EXTREMITY ANGIOGRAM N/A 10/31/2011   Procedure: LOWER EXTREMITY ANGIOGRAM;  Surgeon: Chuck Hint, MD;  Location: [facility] CATH LAB;  Service: Cardiovascular;  Laterality: N/A;  . Lower extremity arterial evaluation  06/27/11   SUMMARY: Right: ABI not ascertained due to false elevation in BP secondary to calcification (posterior tibial artery is non compressible). Left: ABI indicates moderate reduction in arterial flow. Bilateral: Great toe PPG waveforms indicate adequate perfusion. Great toe pressures not obtained due to patient's movements secondary to pain.  Marland Kitchen SHUNTOGRAM N/A 03/17/2014   Procedure: Betsey Amen;  Surgeon: Fransisco Hertz, MD;  Location: [facility] CATH LAB;  Service: Cardiovascular;  Laterality: N/A;   Social History:  reports that she quit smoking about 30 years ago. She has a 20.00 pack-year smoking history. She has quit using smokeless tobacco. She reports that she does not drink alcohol or use drugs.  Allergies  Allergen Reactions  . Aspirin Nausea And Vomiting and Other (See Comments)    325 mg (adult strength) [name]     Family History  Problem Relation Age of Onset  . Heart attack Daughter   . Heart disease Daughter        Before age 39  . Cancer Sister        STOMACH  . Diabetes Sister   . Cancer Brother        BONE  . Diabetes Brother   . Hyperlipidemia Daughter   . Hypertension Daughter   . Heart disease  Daughter        before age 33  . Kidney disease Daughter   . Other Daughter        varicose veins  . Diabetes Daughter   . Heart disease Son        before age 4  . Hyperlipidemia Son   . Hypertension Son   . Heart attack Son   . Anesthesia problems Neg Hx   . Hypotension Neg Hx   . Malignant hyperthermia Neg Hx   . Pseudochol deficiency Neg Hx      Prior to Admission medications   Medication Sig Start Date End Date Taking? Authorizing Provider  acetaminophen (TYLENOL) 325 MG tablet Take 650 mg by mouth every 6 (six) hours as needed for mild pain.    [provider]  albuterol-ipratropium (COMBIVENT) 18-103 MCG/ACT inhaler Inhale 1-2 puffs into the lungs at bedtime as needed for wheezing or shortness of breath.  [provider]  allopurinol (ZYLOPRIM) 100 MG tablet Take 100 mg by mouth daily.     [provider]  aspirin 325 MG tablet Take 1 tablet (325 mg total) by mouth daily. 01/04/16   Alison Murray, MD  calcitRIOL (ROCALTROL) 0.25 MCG capsule Take 0.25 mcg by mouth every other day.  11/04/12   [provider]  Calcium Carbonate-Vitamin D (CALCIUM-VITAMIN D) 500-200 MG-UNIT tablet Take 1 tablet by mouth daily.    [provider]  Cholecalciferol (VITAMIN D PO) Take 1 capsule by mouth daily.    [provider]  colchicine 0.6 MG tablet Take two tablets by mouth 06/07/16 and then take one tablet daily until symptoms resolve. 06/07/16   Hilty, Lisette Abu, MD  docusate sodium (COLACE) 100 MG capsule Take 100 mg by mouth daily as needed for mild constipation.    [provider]  famotidine (PEPCID) 20 MG tablet Take 1 tablet (20 mg total) by mouth at bedtime. Patient taking differently: Take 20 mg by mouth at bedtime as needed for heartburn.  11/13/15   Rodolph Bong, MD  feeding supplement (BOOST / RESOURCE BREEZE) LIQD Take 1 Container by mouth 3 (three) times daily between meals. 11/13/15   Rodolph Bong, MD    furosemide (LASIX) 80 MG tablet Take 1 tablet (80 mg total) by mouth 2 (two) times daily. 04/22/16   Maxie Barb, MD  gabapentin (NEURONTIN) 300 MG capsule Take 1 capsule (300 mg total) by mouth at bedtime. 04/22/16   Maxie Barb, MD  hydrALAZINE (APRESOLINE) 50 MG tablet Take 1 tablet (50 mg total) by mouth 2 (two) times daily. Patient taking differently: Take 50 mg by mouth 3 (three) times daily.  10/22/15   Hilty, Lisette Abu, MD  isosorbide dinitrate (ISORDIL) 20 MG tablet Take 1 tablet (20 mg total) by mouth 3 (three) times daily. 04/22/16   Maxie Barb, MD  levothyroxine (SYNTHROID, LEVOTHROID) 100 MCG tablet Take 1 tablet (100 mcg total) by mouth daily. 04/22/16   Maxie Barb, MD  metoCLOPramide (REGLAN) 5 MG tablet Take 1 tablet (5 mg total) by mouth every 6 (six) hours as needed for nausea. 01/04/16   Alison Murray, MD  metoprolol tartrate (LOPRESSOR) 25 MG tablet Take 0.5 tablets (12.5 mg total) by mouth 2 (two) times daily. 04/22/16   Maxie Barb, MD  potassium chloride 20 MEQ/15ML (10%) SOLN Take 15 mLs (20 mEq total) by mouth daily. 04/22/16   Maxie Barb, MD  pravastatin (PRAVACHOL) 40 MG tablet Take 40 mg by mouth every evening.     [provider]  sevelamer carbonate (RENVELA) 0.8 g PACK packet Take 0.8 g by mouth 3 (three) times daily with meals. 04/22/16   Maxie Barb, MD  sodium bicarbonate 650 MG tablet Take 1 tablet (650 mg total) by mouth daily. 11/13/15   Rodolph Bong, MD   Physical Exam: Vitals:   08/12/16 1215 08/12/16 1230 08/12/16 1315 08/12/16 1330  BP: 131/72 (!) 162/61 (!) 157/46 (!) 144/49  Pulse: 62 63 63 (!) 59  Resp: 18 15 13 14   SpO2: 96% 98% 100% 96%  Weight:      Height:        Wt Readings from Last 3 Encounters:  08/12/16 50.8 kg (112 lb)  04/23/16 51.2 kg (112 lb 14 oz)  01/20/16 50.8 kg (111 lb 15.9 oz)    General:  Appears calm and comfortable; A&Ox1 Eyes:  PERRL, EOMI,  normal  lids, iris ENT:  grossly normal hearing, lips & tongue Neck:  no LAD, masses or thyromegaly Cardiovascular:  RRR, no m/r/g. No LE edema.  Respiratory:  CTA bilaterally, no w/r/r. Normal respiratory effort. Abdomen:  soft, ntnd Skin:  no rash or induration seen on limited exam Musculoskeletal:  grossly normal tone BUE/BLE; except L BKA Psychiatric:  grossly normal mood and affect, speech fluent and appropriate Neurologic:  CN 2-12 grossly intact, moves all extremities in coordinated fashion.          Labs on Admission:  Basic Metabolic Panel:  Recent Labs Lab 08/12/16 1057 08/12/16 1110  NA 141 142  K 4.1 4.1  CL 109 110  CO2 23  --   GLUCOSE 162* 157*  BUN 46* 49*  CREATININE 2.89* 3.00*  CALCIUM 8.9  --    Liver Function Tests:  Recent Labs Lab 08/12/16 1057  AST 29  ALT 20  ALKPHOS 102  BILITOT 0.6  PROT 6.5  ALBUMIN 3.1*   No results for input(s): LIPASE, AMYLASE in the last 168 hours. No results for input(s): AMMONIA in the last 168 hours. CBC:  Recent Labs Lab 08/12/16 1057 08/12/16 1110  WBC 8.5  --   NEUTROABS 5.2  --   HGB 10.7* 11.2*  HCT 33.6* 33.0*  MCV 93.9  --   PLT 349  --    Cardiac Enzymes: No results for input(s): CKTOTAL, CKMB, CKMBINDEX, TROPONINI in the last 168 hours.  BNP (last 3 results)  Recent Labs  01/16/16 1151 04/18/16 1620  BNP 3,168.6* 2,284.3*    ProBNP (last 3 results) No results for input(s): PROBNP in the last 8760 hours.   Serum creatinine: 3 mg/dL (H) 11/55/20 8022 Estimated creatinine clearance: 8.4 mL/min (A)  CBG:  Recent Labs Lab 08/12/16 1019  GLUCAP 145*    Radiological Exams on Admission: Dg Chest 1 View  Result Date: 08/12/2016 CLINICAL DATA:  83 year old female with cough and weakness. Confusion. EXAM: CHEST 1 VIEW COMPARISON:  02/16/2017 and earlier. FINDINGS: AP view at 1145 hours. Lung volumes are within normal limits. Resolved bilateral veiling pulmonary opacity since January. Mild  cardiomegaly. Calcified aortic atherosclerosis is less apparent. Other mediastinal contours are within normal limits. No pneumothorax, pulmonary edema, pleural effusion or confluent pulmonary opacity. Negative visible bowel gas pattern. Surgical clips along the medial humeri, more numerous on the left. IMPRESSION: No acute cardiopulmonary abnormality. Electronically Signed   By: Odessa Fleming M.D.   On: 08/12/2016 12:01   Ct Head Wo Contrast  Result Date: 08/12/2016 CLINICAL DATA:  Left facial droop and slurred speech EXAM: CT HEAD WITHOUT CONTRAST TECHNIQUE: Contiguous axial images were obtained from the base of the skull through the vertex without intravenous contrast. COMPARISON:  CT head 01/16/2016 FINDINGS: Brain: Moderate to advanced atrophy is stable. Chronic microvascular ischemic change in the white matter. Chronic infarct in the left thalamus. Chronic bilateral parietal infarcts unchanged. Negative for acute infarct, hemorrhage, or mass lesion. Vascular: Carotid and vertebral artery calcification. Negative for hyperdense vessel. Skull: Negative Sinuses/Orbits: Bilateral lens replacement.  Paranasal sinuses clear Other: None IMPRESSION: Moderate to advanced atrophy. Moderate to advanced chronic ischemic change. No acute abnormality. Electronically Signed   By: Marlan Palau M.D.   On: 08/12/2016 11:26    EKG: Independently reviewed. Sinus rhythm, compared to 05/2016 EKG partial right bundle branch block now seen; no STEMI  Assessment/Plan Principal Problem:   Slurred speech Active Problems:   CAD - moderate at cath July 2012 (medical  Rx)   Peripheral vascular disease (HCC)   Dyslipidemia   Chronic renal insufficiency, stage IV (severe)- HD started 01/05/13   Diabetes mellitus type 2 with peripheral artery disease (HCC)   Essential hypertension  Slurred speech No deficit is noted by EDP Emergency room Family asserts patient still has slurred speech. EDP ordered SLP consult Nothing by mouth  for now Maintenance IV fluids UA neg  Mild dehydration Maintenance IV fluids  Hypertension When necessary hydralazine 10 mg IV as needed for severe blood pressure Hold oral hydralazine  Diabetes Sliding scale insulin  Hyperlipidemia Continue statin  CHF Resume lasix in AM Lopressor IV in place of PO  Chronic pain Hold gabapentin  CAD Hold isosorbide  Hypothyroidism Hold OP synthroid 100 mcg qd No signs of hyper or hypothyroidism  CKD IV Hold renvela, sodium bicarb  GERD Order IV pepcid if needed  Constipation Hold colace  Hyperlipidemia hold statin  Gout Hold allopurinol   Code Status: DNR  DVT Prophylaxis: heparin Family Communication: son at bedside Disposition Plan: Pending Improvement  Status: tele obs  Haydee Salter, MD Family Medicine [facility] [email or web address] Password [email or web address]

## 2016-08-12 NOTE — ED Notes (Signed)
Pt to CT

## 2016-08-12 NOTE — ED Triage Notes (Signed)
Per EMS patient from home to be evaluated for stroke symptoms. LSN 2 days ago. Family noted patient had left side facial droop- unappreciated by EMS and slurred speech. Patient has hx of CVA last in October with speech impairment and right side deficits without any residual deficits. Patient is alert and oriented to just self at baseline per family. Noted minor weakness in left grip strength no arm or leg drift. AKA on left.

## 2016-08-13 ENCOUNTER — Encounter (HOSPITAL_COMMUNITY): Payer: Medicare Other

## 2016-08-13 DIAGNOSIS — E785 Hyperlipidemia, unspecified: Secondary | ICD-10-CM

## 2016-08-13 DIAGNOSIS — R4701 Aphasia: Secondary | ICD-10-CM | POA: Diagnosis present

## 2016-08-13 DIAGNOSIS — R4781 Slurred speech: Secondary | ICD-10-CM

## 2016-08-13 DIAGNOSIS — K5900 Constipation, unspecified: Secondary | ICD-10-CM | POA: Diagnosis present

## 2016-08-13 DIAGNOSIS — R05 Cough: Secondary | ICD-10-CM | POA: Diagnosis present

## 2016-08-13 DIAGNOSIS — Z66 Do not resuscitate: Secondary | ICD-10-CM | POA: Diagnosis present

## 2016-08-13 DIAGNOSIS — E860 Dehydration: Secondary | ICD-10-CM | POA: Diagnosis present

## 2016-08-13 DIAGNOSIS — I739 Peripheral vascular disease, unspecified: Secondary | ICD-10-CM | POA: Diagnosis not present

## 2016-08-13 DIAGNOSIS — I130 Hypertensive heart and chronic kidney disease with heart failure and stage 1 through stage 4 chronic kidney disease, or unspecified chronic kidney disease: Secondary | ICD-10-CM | POA: Diagnosis present

## 2016-08-13 DIAGNOSIS — Z7982 Long term (current) use of aspirin: Secondary | ICD-10-CM | POA: Diagnosis not present

## 2016-08-13 DIAGNOSIS — I639 Cerebral infarction, unspecified: Secondary | ICD-10-CM | POA: Diagnosis present

## 2016-08-13 DIAGNOSIS — I5032 Chronic diastolic (congestive) heart failure: Secondary | ICD-10-CM | POA: Diagnosis present

## 2016-08-13 DIAGNOSIS — E1122 Type 2 diabetes mellitus with diabetic chronic kidney disease: Secondary | ICD-10-CM | POA: Diagnosis present

## 2016-08-13 DIAGNOSIS — N184 Chronic kidney disease, stage 4 (severe): Secondary | ICD-10-CM | POA: Diagnosis present

## 2016-08-13 DIAGNOSIS — E039 Hypothyroidism, unspecified: Secondary | ICD-10-CM | POA: Diagnosis present

## 2016-08-13 DIAGNOSIS — Z89512 Acquired absence of left leg below knee: Secondary | ICD-10-CM | POA: Diagnosis not present

## 2016-08-13 DIAGNOSIS — Z87891 Personal history of nicotine dependence: Secondary | ICD-10-CM | POA: Diagnosis not present

## 2016-08-13 DIAGNOSIS — E1151 Type 2 diabetes mellitus with diabetic peripheral angiopathy without gangrene: Secondary | ICD-10-CM | POA: Diagnosis present

## 2016-08-13 DIAGNOSIS — Z7401 Bed confinement status: Secondary | ICD-10-CM | POA: Diagnosis not present

## 2016-08-13 DIAGNOSIS — I2510 Atherosclerotic heart disease of native coronary artery without angina pectoris: Secondary | ICD-10-CM

## 2016-08-13 DIAGNOSIS — K219 Gastro-esophageal reflux disease without esophagitis: Secondary | ICD-10-CM | POA: Diagnosis present

## 2016-08-13 DIAGNOSIS — M109 Gout, unspecified: Secondary | ICD-10-CM | POA: Diagnosis present

## 2016-08-13 DIAGNOSIS — G8929 Other chronic pain: Secondary | ICD-10-CM | POA: Diagnosis present

## 2016-08-13 DIAGNOSIS — I10 Essential (primary) hypertension: Secondary | ICD-10-CM | POA: Diagnosis not present

## 2016-08-13 DIAGNOSIS — I429 Cardiomyopathy, unspecified: Secondary | ICD-10-CM | POA: Diagnosis present

## 2016-08-13 DIAGNOSIS — I252 Old myocardial infarction: Secondary | ICD-10-CM | POA: Diagnosis not present

## 2016-08-13 DIAGNOSIS — Z515 Encounter for palliative care: Secondary | ICD-10-CM | POA: Diagnosis present

## 2016-08-13 DIAGNOSIS — Z79899 Other long term (current) drug therapy: Secondary | ICD-10-CM | POA: Diagnosis not present

## 2016-08-13 LAB — LIPID PANEL
Cholesterol: 174 mg/dL (ref 0–200)
HDL: 45 mg/dL (ref >40)
LDL CALC: 99 mg/dL (ref 0–99)
Total CHOL/HDL Ratio: 3.9 RATIO
Triglycerides: 151 mg/dL — ABNORMAL HIGH (ref <150)
VLDL: 30 mg/dL (ref 0–40)

## 2016-08-13 MED ORDER — POLYVINYL ALCOHOL 1.4 % OP SOLN: 1.0000 [drp] | Freq: Four times a day (QID) | OPHTHALMIC | Status: DC | PRN

## 2016-08-13 MED ORDER — LORAZEPAM 0.5 MG PO TABS: 0.5000 mg | ORAL_TABLET | ORAL | Status: DC | PRN

## 2016-08-13 MED ORDER — MORPHINE SULFATE (PF) 4 MG/ML IV SOLN
1.0000 mg | INTRAVENOUS | Status: DC | PRN
  Administered 2016-08-15: 1 mg via INTRAVENOUS
  Filled 2016-08-13: qty 1

## 2016-08-13 MED ORDER — ACETAMINOPHEN 325 MG PO TABS: 650.0000 mg | ORAL_TABLET | Freq: Four times a day (QID) | ORAL | Status: DC | PRN

## 2016-08-13 MED ORDER — LORAZEPAM 2 MG/ML IJ SOLN: 0.5000 mg | INTRAMUSCULAR | Status: DC | PRN

## 2016-08-13 MED ORDER — BIOTENE DRY MOUTH MT LIQD: 15.0000 mL | OROMUCOSAL | Status: DC | PRN

## 2016-08-13 MED ORDER — ONDANSETRON HCL 4 MG/2ML IJ SOLN
4.0000 mg | Freq: Four times a day (QID) | INTRAMUSCULAR | Status: DC | PRN
Start: 2016-08-13 — End: 2016-08-16

## 2016-08-13 MED ORDER — ONDANSETRON 4 MG PO TBDP: 4.0000 mg | ORAL_TABLET | Freq: Four times a day (QID) | ORAL | Status: DC | PRN

## 2016-08-13 MED ORDER — ATROPINE SULFATE 1 % OP SOLN: 4.0000 [drp] | OPHTHALMIC | Status: DC | PRN

## 2016-08-13 MED ORDER — ACETAMINOPHEN 650 MG RE SUPP: 650.0000 mg | Freq: Four times a day (QID) | RECTAL | Status: DC | PRN

## 2016-08-13 MED ORDER — LORAZEPAM 2 MG/ML PO CONC
0.5000 mg | ORAL | Status: DC | PRN
Start: 2016-08-13 — End: 2016-08-16

## 2016-08-13 NOTE — Evaluation (Signed)
Speech Language Pathology Evaluation Patient Details Name: [name] MRN: [id number] DOB: [birth year] Today's Date: 08/13/2016 Time: 0901-0908 SLP Time Calculation (min) (ACUTE ONLY): 7 min  Problem List:  Patient Active Problem List   Diagnosis Date Noted  . Slurred speech 08/12/2016  . Acute gout due to renal impairment involving right shoulder 06/07/2016  . S/P AKA (above knee amputation) unilateral, left (HCC)   . CHF (congestive heart failure) (HCC) 04/18/2016  . Respiratory distress   . Acute on chronic systolic congestive heart failure (HCC) 01/16/2016  . Palliative care encounter   . Altered mental status   . Goals of care, counseling/discussion   . Palliative care by specialist   . Aphasia following other cerebrovascular disease   . Hemi-neglect of right side   . Cerebral thrombosis with cerebral infarction 12/30/2015  . Cerebrovascular accident (CVA) involving left middle cerebral artery territory [facility])   . Altered mental state 12/29/2015  . Acute right-sided weakness 12/29/2015  . Dysphagia   . FTT (failure to thrive) in adult 11/08/2015  . Nausea & vomiting 11/08/2015  . Metabolic acidosis 11/08/2015  . End stage renal disease (HCC) 10/07/2015  . Hypertensive heart disease with heart failure (HCC) 10/07/2015  . Junctional bradycardia 09/18/2015  . Atypical chest pain 06/23/2014  . Neck pain   . Food impaction of esophagus   . Epiglottitis 04/13/2014  . Wound disruption, post-op, skin 02/05/2014  . Pre-operative cardiovascular examination 12/13/2013  . Foot pain 09/04/2013  . CKD (chronic kidney disease) stage 4, GFR 15-29 ml/min (HCC) 09/04/2013  . Essential hypertension 08/23/2013  . Malnutrition of moderate degree (HCC) 08/21/2013  . Chronic diastolic heart failure (HCC) 08/15/2013  . Anemia of chronic disease 08/15/2013  . Hyperkalemia 08/15/2013  . 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 Dyslipidemia 01/05/2013  .  Chronic renal insufficiency, stage IV (severe)- HD started 01/05/13 01/05/2013  . Diabetes mellitus type 2 with peripheral artery disease (HCC) 01/05/2013  . Peripheral vascular disease (HCC) 10/19/2011  . CAD - moderate at cath July 2012 (medical Rx) 02/08/2011  . 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 Severe hypothyroidism 02/08/2011   Past Medical History:  Past Medical History:  Diagnosis Date  . Abnormal nuclear stress test 06/01/09   Demonstrated a new area of infarct scar, peri-infarct ischemia seen in the inferolateral territory. EF eas normal at 70% with mild hypocontractility at the apex, distal inferolateral wall.  . Anemia   . Arthritis   . Cardiomyopathy, idiopathic (HCC) 02/08/2011  . Chronic diastolic CHF (congestive heart failure) (HCC)    Takes Lasix  . Chronic kidney disease (CKD), stage IV (severe) (HCC)   . Coronary artery disease    a. Cath 09/2010 - med rx.  . Diabetes mellitus    type 2 NIDDM  . Dyslipidemia   . GERD (gastroesophageal reflux disease)   . Gout    takes allopurinol  . H/O echocardiogram 09/06/11   Indication- nonIschemic Cardiomyopathy. EF = now greater than 55% with no regional wall motion abnormalities. Tthere is mild to moderate trisuspid regurgitayion and mild pulmonary hypertension with an RVSP of 35 mmHg as well as stage 1 diastolic dysfunction and mild to moderate LVH.  Marland Kitchen Hx of transient ischemic attack (TIA)   . Hypertension   . Hypothyroidism    (SEVERE) Takes Levothryroxine  . Irregular heartbeat   . Memory loss   . Myocardial infarct (HCC)    x 3 unsure of years  .  Nonischemic cardiomyopathy (HCC)    EF now is 55%, reduced due to myxedema, which is improved.  . Peripheral neuropathy   . Peripheral vascular disease (HCC)    a. s/p L BKA.  . Shingles   . Stroke (HCC)   . TIA (transient ischemic attack)   . Ulcer    Past Surgical History:  Past Surgical History:  Procedure Laterality Date  .  AMPUTATION  07/05/2011   Procedure: AMPUTATION BELOW KNEE;  Surgeon: Chuck Hint, MD;  Location: [facility] OR;  Service: Vascular;  Laterality: Left;  . ANGIOPLASTY  1988  . AV FISTULA PLACEMENT    . AV FISTULA PLACEMENT Right 12/24/2013   Procedure: INSERTION OF ARTERIOVENOUS (AV) GORE-TEX GRAFT ARM;  Surgeon: Chuck Hint, MD;  Location[facility] OR;  Service: Vascular;  Laterality: Right;  . BACK SURGERY     [facility]  . CARDIAC CATHETERIZATION  09/28/07   Demonstrated multiple sequential lesions around 40 to 30% in the RCA territory.  . Duplex doppler  05/10/11   LE arterial dopplers demonstrate bilaterally reduced ABIs of 0.91 on right & 0.56 on left. She does report some decreased pain on the left, & there's moderate mixed-density plaque in the right SFA w/50 to 69% reduction. There's a 69% reduction in the left SFA & does appear to be occlusive disease of left posterior tibial artery. Right posterior dorsalis pedis artery demonstrates occlusive disease  . ESOPHAGOGASTRODUODENOSCOPY N/A 04/15/2014   Procedure: ESOPHAGOGASTRODUODENOSCOPY (EGD);  Surgeon: Rachael Fee, MD;  Location: [facility] ENDOSCOPY;  Service: Endoscopy;  Laterality: N/A;  . EYE SURGERY     Left eye surgery; cataract removal  . INSERTION OF DIALYSIS CATHETER N/A 01/06/2013   Procedure: INSERTION OF DIALYSIS CATHETER;  Surgeon: Larina Earthly, MD;  Location: [facility] OR;  Service: Vascular;  Laterality: N/A;  . left bka  07/05/2011  . left lower extremity venous duplex Left 06/27/11   Summary: No evidence of DVT involving the left lower extremity and right common femoral vein.   Marland Kitchen LIGATION ARTERIOVENOUS GORTEX GRAFT Right 03/25/2014   Procedure: LIGATION ARTERIOVENOUS GORTEX GRAFT;  Surgeon: Chuck Hint, MD;  Location: [facility] OR;  Service: Vascular;  Laterality: Right;  . LOWER EXTREMITY ANGIOGRAM N/A 10/31/2011   Procedure: LOWER EXTREMITY ANGIOGRAM;  Surgeon: Chuck Hint, MD;  Location: [facility] CATH LAB;   Service: Cardiovascular;  Laterality: N/A;  . Lower extremity arterial evaluation  06/27/11   SUMMARY: Right: ABI not ascertained due to false elevation in BP secondary to calcification (posterior tibial artery is non compressible). Left: ABI indicates moderate reduction in arterial flow. Bilateral: Great toe PPG waveforms indicate adequate perfusion. Great toe pressures not obtained due to patient's movements secondary to pain.  Marland Kitchen SHUNTOGRAM N/A 03/17/2014   Procedure: Betsey Amen;  Surgeon: Fransisco Hertz, MD;  Location: [facility] CATH LAB;  Service: Cardiovascular;  Laterality: N/A;   HPI:  Beanca G Enochis a 83 y.o.femalewith past history significant for PVD, memory loss, GERD, anemia, appears heart failure, chronic kidney disease, coronary artery disease, diabetes, gout, hypertension, hypothyroidism presented with slurred speech, right facial droop. Pt hx of left MCA/PCA CVA, right inattention, dysphagia, aphasia. MRI 08/12/16 showed small left acute parietal lobe infarcts, punctate acute right frontal lobe infarct, chronic bilateral scratch sec infarcts in both parietal lobes, left thalamus and right cerebellum. Pt seen by SLP during prior admissions for both swallowing and cognition. Most recent BSE 12/29/15 noted severe aspiration risk, h/o esophageal deficits including dysmotility and diverticulum. Pt  advanced to dys 1 (puree) and thin liquids prior to d/c.   Assessment / Plan / Recommendation Clinical Impression  Patient presents with severe cognitive linguistic deficits which are likely worsened by reduced attention. Pt does have history of severe cognitive deficits at baseline, though no family members are present to confirm. She is alert with eyes open upon SLP entry; she does make eye contact but has inconsistent response to verbal and tactile stimuli. Moans softly upon repositioning head of bed, does not follow basic commands in spite of max verbal, tactile and visual cues. In response to Y/N  questions, pt voices "mmmhmm" re: her name, though she does not respond to other questions. SLP will follow up briefly for family education and basic communication, however anticipate pt's prognosis is poor given her prior level of function.     SLP Assessment  SLP Visit Diagnosis: Cognitive communication deficit (R41.841);Aphasia (R47.01);Attention and concentration deficit Attention and concentration deficit following: Cerebral infarction;Other cerebrovascular disease    Follow Up Recommendations  Skilled Nursing facility    Frequency and Duration min 1 x/week  1 week      SLP Evaluation Cognition  Overall Cognitive Status: Impaired/Different from baseline Arousal/Alertness: Awake/alert Orientation Level: Oriented to person;Disoriented to place;Disoriented to time;Disoriented to situation Attention: Focused;Sustained Focused Attention: Impaired Focused Attention Impairment: Verbal basic;Functional basic Sustained Attention: Impaired Sustained Attention Impairment: Verbal basic;Functional basic Memory: Impaired Memory Impairment: Storage deficit;Retrieval deficit;Decreased recall of new information;Decreased short term memory Decreased Short Term Memory: Verbal basic;Functional basic Safety/Judgment: Impaired       Comprehension  Auditory Comprehension Overall Auditory Comprehension: Impaired Yes/No Questions: Impaired Basic Biographical Questions: 0-25% accurate Commands: Impaired One Step Basic Commands: 0-24% accurate Conversation: Other (comment) Other Conversation Comments: no functional speech Interfering Components: Attention;Processing speed;Working Theatre manager: Not tested Reading Comprehension Reading Status: Not tested    Expression Expression Primary Mode of Expression: Other (comment) (makes eye contact, moaning sounds) Verbal Expression Overall Verbal Expression: Impaired at baseline Initiation:  Impaired Repetition: Impaired Level of Impairment: Word level Naming: Not tested Pragmatics: Impairment Impairments: Abnormal affect;Eye contact Interfering Components: Attention Non-Verbal Means of Communication: Eye gaze Written Expression Dominant Hand: Right Written Expression: Not tested   Oral / Motor  Oral Motor/Sensory Function Overall Oral Motor/Sensory Function: Other (comment) (unable to assess) Motor Speech Overall Motor Speech: Impaired at baseline   GO          Functional Assessment Tool Used: skilled clinical judgment Functional Limitations: Spoken language expressive Swallow Current Status (W0981): 100 percent impaired, limited or restricted Swallow Goal Status ([id number]): At least 80 percent but less than 100 percent impaired, limited or restricted Spoken Language Expression Current Status [phone]): 100 percent impaired, limited or restricted Spoken Language Expression Goal Status [phone]): At least 80 percent but less than 100 percent impaired, limited or restricted        Rondel Baton, MS, CCC-SLP Speech-Language Pathologist  Arlana Lindau 08/13/2016, 11:17 AM

## 2016-08-13 NOTE — Progress Notes (Signed)
PROGRESS NOTE    ROSELAND PULLARA  SKA:[id number]  DOB: 1924-01-11  DOA: 08/12/2016 PCP: Georgianne Fick, MD Outpatient Specialists:   Hospital course: KENNIDY WYSZYNSKI is a 83 y.o. female  with past history significant for anemia, appears heart failure, chronic kidney disease, coronary artery disease, diabetes, reflux, gout, hypertension, hypothyroidism presents with family with chief complaint of slurred speech and acute CVA.   Assessment & Plan:   Small acute left parietal lobe infarcts. Punctate acute right frontal lobe infarct. Pt continues to have Slurred speech and has been assessed by SLP and kept NPO for HIGH aspiration risk.  Family doesn't seem to think feeding tube and PEG is something patient would want or tolerate, they are discussing among themselves.  Pt wanting to eat and drink orally.  Discussed with family at bedside.  They are considering full comfort care.   Continue rectal aspirin.   Mild dehydration Gentle IV fluids  Hypertension When necessary hydralazine 10 mg IV as needed for severe blood pressure Hold oral hydralazine  Diabetes Mellitus, type 2  Sliding scale insulin  CBG (last 3)   Recent Labs  08/12/16 1019  GLUCAP 145*   Hyperlipidemia Continue statin when can take oral.   CHF cardiomyopathy with EF 20%.   Lasix on hold for now Lopressor IV in place of PO Reviewed Echo from 1/18.   Chronic pain Hold gabapentin  CAD Hold isosorbide  Hypothyroidism Hold OP synthroid 100 mcg qd No signs of hyper or hypothyroidism  CKD IV Hold renvela, sodium bicarb  GERD Order IV pepcid if needed  Constipation Hold colace  Hyperlipidemia hold statin  Gout Hold allopurinol  Code Status: DNR  DVT Prophylaxis: heparin Family Communication: daughter and son at bedside Disposition Plan: Pending decision of family  Subjective: Pt says she would like something to drink.   Objective: Vitals:   08/13/16 0400 08/13/16  0600 08/13/16 1000 08/13/16 1049  BP: (!) 191/42 (!) 100/50 (!) 191/53   Pulse: 64 75    Resp: 18 18    Temp:    97.7 F (36.5 C)  TempSrc:    Oral  SpO2: 100% 99%    Weight:      Height:       No intake or output data in the 24 hours ending 08/13/16 1212 Filed Weights   08/12/16 1019  Weight: 50.8 kg (112 lb)   Exam:  General exam: awake alert NAD appears comfortable.  Respiratory system:  No increased work of breathing. Cardiovascular system: S1 & S2 heard.  Gastrointestinal system: Abdomen is nondistended, soft and nontender. Normal bowel sounds heard. Central nervous system: Alert and oriented. Pronounced facial droop.  Extremities: no CCE.  Data Reviewed: Basic Metabolic Panel:  Recent Labs Lab 08/12/16 1057 08/12/16 1110 08/12/16 1802  NA 141 142  --   K 4.1 4.1  --   CL 109 110  --   CO2 23  --   --   GLUCOSE 162* 157*  --   BUN 46* 49*  --   CREATININE 2.89* 3.00*  --   CALCIUM 8.9  --   --   MG  --   --  1.8  PHOS  --   --  4.1   Liver Function Tests:  Recent Labs Lab 08/12/16 1057  AST 29  ALT 20  ALKPHOS 102  BILITOT 0.6  PROT 6.5  ALBUMIN 3.1*   No results for input(s): LIPASE, AMYLASE in the last 168 hours. No results for  input(s): AMMONIA in the last 168 hours. CBC:  Recent Labs Lab 08/12/16 1057 08/12/16 1110  WBC 8.5  --   NEUTROABS 5.2  --   HGB 10.7* 11.2*  HCT 33.6* 33.0*  MCV 93.9  --   PLT 349  --    Cardiac Enzymes:  Recent Labs Lab 08/12/16 1802  TROPONINI 0.03*   CBG (last 3)   Recent Labs  08/12/16 1019  GLUCAP 145*   No results found for this or any previous visit (from the past 240 hour(s)).   Studies: Dg Chest 1 View  Result Date: 08/12/2016 CLINICAL DATA:  83 year old female with cough and weakness. Confusion. EXAM: CHEST 1 VIEW COMPARISON:  02/16/2017 and earlier. FINDINGS: AP view at 1145 hours. Lung volumes are within normal limits. Resolved bilateral veiling pulmonary opacity since January. Mild  cardiomegaly. Calcified aortic atherosclerosis is less apparent. Other mediastinal contours are within normal limits. No pneumothorax, pulmonary edema, pleural effusion or confluent pulmonary opacity. Negative visible bowel gas pattern. Surgical clips along the medial humeri, more numerous on the left. IMPRESSION: No acute cardiopulmonary abnormality. Electronically Signed   By: Odessa Fleming M.D.   On: 08/12/2016 12:01   Ct Head Wo Contrast  Result Date: 08/12/2016 CLINICAL DATA:  Left facial droop and slurred speech EXAM: CT HEAD WITHOUT CONTRAST TECHNIQUE: Contiguous axial images were obtained from the base of the skull through the vertex without intravenous contrast. COMPARISON:  CT head 01/16/2016 FINDINGS: Brain: Moderate to advanced atrophy is stable. Chronic microvascular ischemic change in the white matter. Chronic infarct in the left thalamus. Chronic bilateral parietal infarcts unchanged. Negative for acute infarct, hemorrhage, or mass lesion. Vascular: Carotid and vertebral artery calcification. Negative for hyperdense vessel. Skull: Negative Sinuses/Orbits: Bilateral lens replacement.  Paranasal sinuses clear Other: None IMPRESSION: Moderate to advanced atrophy. Moderate to advanced chronic ischemic change. No acute abnormality. Electronically Signed   By: Marlan Palau M.D.   On: 08/12/2016 11:26   Mr Brain Wo Contrast  Result Date: 08/12/2016 CLINICAL DATA:  Slurred speech. Possible left facial droop. Minor left grip weakness. EXAM: MRI HEAD WITHOUT CONTRAST MRA HEAD WITHOUT CONTRAST TECHNIQUE: Multiplanar, multiecho pulse sequences of the brain and surrounding structures were obtained without intravenous contrast. Angiographic images of the head were obtained using MRA technique without contrast. COMPARISON:  Head CT 08/12/2016. Brain MRI 12/29/2015. Head MRA 07/05/2004. FINDINGS: MRI HEAD FINDINGS Brain: There is a 4 mm acute cortical infarct in the anterior right frontal lobe. There are multiple  small foci of acute infarction involving cortex and white matter in the left parietal lobe, predominantly superior to the chronic infarct and with the largest confluent focus of infarction measuring slightly greater than 1 cm in size. There is a chronic right parietal infarct as well which is new from the prior MRI, with a small amount of mineralization and/or chronic blood products noted. Moderate ventriculomegaly is unchanged from the prior MRI and favored to be ex vacuo in nature due to cerebral atrophy and chronic infarcts, with normal pressure hydrocephalus considered less likely. Confluent periventricular white matter T2 hyperintensities have likely slightly progressed from the prior MRI and are nonspecific but compatible with moderately extensive chronic small vessel ischemia. There is no evidence of acute intracranial hemorrhage, mass, midline shift, or extra-axial fluid collection. Small chronic infarcts are again seen in the left thalamus and right cerebellum. Vascular: Major intracranial vascular flow voids are preserved. Skull and upper cervical spine: Unremarkable bone marrow signal. Sinuses/Orbits: Prior left cataract extraction. Trace left  mastoid effusion. No significant paranasal sinus disease. Other: None. MRA HEAD FINDINGS The study is mildly motion degraded. The visualized distal vertebral arteries are patent to the basilar with the right being moderately dominant. There is mild irregular narrowing of the distal left V4 segment. The basilar artery is patent with mild diffuse irregularity which may reflect a combination of atherosclerosis and motion artifact but without evidence of flow limiting stenosis. SCAs are patent though there may be a significant stenosis at the left SCA origin. PCAs are patent with possible moderate right greater than left origin stenoses, although motion through this level may exaggerate the appearance. More distally, there is diffuse PCA irregularity bilaterally with  mild-to-moderate mid to distal right P2 narrowing and severe tandem P3 stenoses bilaterally. Posterior communicating arteries are not identified. The internal carotid artery's are patent from skullbase to carotid termini with moderate anterior right cavernous and mild bilateral supraclinoid ICA stenoses. The right A1 segment is widely patent, although there is a mild-to-moderate focal mid right A2 stenosis. There is diminished flow related enhancement in the left ACA with a suspected severe stenosis at its origin. There is either a severe stenosis or short segment occlusion of the mid left A2 segment with reconstituted but diminished flow more distally. There is an early bifurcation of the right MCA versus prominent anterior temporal artery variant with the severe tandem distal M1/proximal M2 stenoses just distal to this. The left MCA is patent with mild distal M1 stenosis and with up to moderate narrowing of M2 branch vessels. No intracranial aneurysm is identified. IMPRESSION: 1. Small acute left parietal lobe infarcts. 2. Punctate acute right frontal lobe infarct. 3. Chronic bilateral scratch sec chronic infarcts in both parietal lobes, left thalamus, and right cerebellum. 4. Moderately extensive chronic small vessel ischemic disease. 5. Advanced intracranial atherosclerosis with numerous anterior and posterior circulation stenoses as above. High-grade left ACA origin stenosis with diminished left ACA flow and additional high-grade stenosis versus short segment occlusion in the A2 segment. Electronically Signed   By: Sebastian Ache M.D.   On: 08/12/2016 17:19   Mr Maxine Glenn Head/brain MV Cm  Result Date: 08/12/2016 CLINICAL DATA:  Slurred speech. Possible left facial droop. Minor left grip weakness. EXAM: MRI HEAD WITHOUT CONTRAST MRA HEAD WITHOUT CONTRAST TECHNIQUE: Multiplanar, multiecho pulse sequences of the brain and surrounding structures were obtained without intravenous contrast. Angiographic images of the  head were obtained using MRA technique without contrast. COMPARISON:  Head CT 08/12/2016. Brain MRI 12/29/2015. Head MRA 07/05/2004. FINDINGS: MRI HEAD FINDINGS Brain: There is a 4 mm acute cortical infarct in the anterior right frontal lobe. There are multiple small foci of acute infarction involving cortex and white matter in the left parietal lobe, predominantly superior to the chronic infarct and with the largest confluent focus of infarction measuring slightly greater than 1 cm in size. There is a chronic right parietal infarct as well which is new from the prior MRI, with a small amount of mineralization and/or chronic blood products noted. Moderate ventriculomegaly is unchanged from the prior MRI and favored to be ex vacuo in nature due to cerebral atrophy and chronic infarcts, with normal pressure hydrocephalus considered less likely. Confluent periventricular white matter T2 hyperintensities have likely slightly progressed from the prior MRI and are nonspecific but compatible with moderately extensive chronic small vessel ischemia. There is no evidence of acute intracranial hemorrhage, mass, midline shift, or extra-axial fluid collection. Small chronic infarcts are again seen in the left thalamus and right cerebellum. Vascular:  Major intracranial vascular flow voids are preserved. Skull and upper cervical spine: Unremarkable bone marrow signal. Sinuses/Orbits: Prior left cataract extraction. Trace left mastoid effusion. No significant paranasal sinus disease. Other: None. MRA HEAD FINDINGS The study is mildly motion degraded. The visualized distal vertebral arteries are patent to the basilar with the right being moderately dominant. There is mild irregular narrowing of the distal left V4 segment. The basilar artery is patent with mild diffuse irregularity which may reflect a combination of atherosclerosis and motion artifact but without evidence of flow limiting stenosis. SCAs are patent though there may be  a significant stenosis at the left SCA origin. PCAs are patent with possible moderate right greater than left origin stenoses, although motion through this level may exaggerate the appearance. More distally, there is diffuse PCA irregularity bilaterally with mild-to-moderate mid to distal right P2 narrowing and severe tandem P3 stenoses bilaterally. Posterior communicating arteries are not identified. The internal carotid artery's are patent from skullbase to carotid termini with moderate anterior right cavernous and mild bilateral supraclinoid ICA stenoses. The right A1 segment is widely patent, although there is a mild-to-moderate focal mid right A2 stenosis. There is diminished flow related enhancement in the left ACA with a suspected severe stenosis at its origin. There is either a severe stenosis or short segment occlusion of the mid left A2 segment with reconstituted but diminished flow more distally. There is an early bifurcation of the right MCA versus prominent anterior temporal artery variant with the severe tandem distal M1/proximal M2 stenoses just distal to this. The left MCA is patent with mild distal M1 stenosis and with up to moderate narrowing of M2 branch vessels. No intracranial aneurysm is identified. IMPRESSION: 1. Small acute left parietal lobe infarcts. 2. Punctate acute right frontal lobe infarct. 3. Chronic bilateral scratch sec chronic infarcts in both parietal lobes, left thalamus, and right cerebellum. 4. Moderately extensive chronic small vessel ischemic disease. 5. Advanced intracranial atherosclerosis with numerous anterior and posterior circulation stenoses as above. High-grade left ACA origin stenosis with diminished left ACA flow and additional high-grade stenosis versus short segment occlusion in the A2 segment. Electronically Signed   By: Sebastian Ache M.D.   On: 08/12/2016 17:19   Scheduled Meds: . aspirin  300 mg Rectal Daily   Or  . aspirin  325 mg Oral Daily  . heparin   5,000 Units Subcutaneous Q8H  . metoprolol tartrate  5 mg Intravenous Q12H   Continuous Infusions: . sodium chloride 75 mL/hr at 08/12/16 1456   Principal Problem:   Slurred speech Active Problems:   CAD - moderate at cath July 2012 (medical Rx)   Peripheral vascular disease (HCC)   Dyslipidemia   Chronic renal insufficiency, stage IV (severe)- HD started 01/05/13   Diabetes mellitus type 2 with peripheral artery disease (HCC)   Essential hypertension  Time spent:   Standley Dakins, MD, FAAFP [facility] Pager [phone]  If 7PM-7AM, please contact night-coverage [email or web address] Password [email or web address] 08/13/2016, 12:12 PM    LOS: 0 days

## 2016-08-13 NOTE — Evaluation (Signed)
Clinical/Bedside Swallow Evaluation Patient Details  Name: [name] MRN: [id number] Date of Birth: Jan 06, 1924  Today's Date: 08/13/2016 Time: SLP Start Time (ACUTE ONLY): 0855 SLP Stop Time (ACUTE ONLY): 0900 SLP Time Calculation (min) (ACUTE ONLY): 5 min  Past Medical History:  Past Medical History:  Diagnosis Date  . Abnormal nuclear stress test 06/01/09   Demonstrated a new area of infarct scar, peri-infarct ischemia seen in the inferolateral territory. EF eas normal at 70% with mild hypocontractility at the apex, distal inferolateral wall.  . Anemia   . Arthritis   . Cardiomyopathy, idiopathic (HCC) 02/08/2011  . Chronic diastolic CHF (congestive heart failure) (HCC)    Takes Lasix  . Chronic kidney disease (CKD), stage IV (severe) (HCC)   . Coronary artery disease    a. Cath 09/2010 - med rx.  . Diabetes mellitus    type 2 NIDDM  . Dyslipidemia   . GERD (gastroesophageal reflux disease)   . Gout    takes allopurinol  . H/O echocardiogram 09/06/11   Indication- nonIschemic Cardiomyopathy. EF = now greater than 55% with no regional wall motion abnormalities. Tthere is mild to moderate trisuspid regurgitayion and mild pulmonary hypertension with an RVSP of 35 mmHg as well as stage 1 diastolic dysfunction and mild to moderate LVH.  Marland Kitchen Hx of transient ischemic attack (TIA)   . Hypertension   . Hypothyroidism    (SEVERE) Takes Levothryroxine  . Irregular heartbeat   . Memory loss   . Myocardial infarct (HCC)    x 3 unsure of years  . Nonischemic cardiomyopathy (HCC)    EF now is 55%, reduced due to myxedema, which is improved.  . Peripheral neuropathy   . Peripheral vascular disease (HCC)    a. s/p L BKA.  . Shingles   . Stroke (HCC)   . TIA (transient ischemic attack)   . Ulcer    Past Surgical History:  Past Surgical History:  Procedure Laterality Date  . AMPUTATION  07/05/2011   Procedure: AMPUTATION BELOW KNEE;  Surgeon: Chuck Hint, MD;  Location: [facility]  OR;  Service: Vascular;  Laterality: Left;  . ANGIOPLASTY  1988  . AV FISTULA PLACEMENT    . AV FISTULA PLACEMENT Right 12/24/2013   Procedure: INSERTION OF ARTERIOVENOUS (AV) GORE-TEX GRAFT ARM;  Surgeon: Chuck Hint, MD;  Location: [facility] OR;  Service: Vascular;  Laterality: Right;  . BACK SURGERY     [facility]  . CARDIAC CATHETERIZATION  09/28/07   Demonstrated multiple sequential lesions around 40 to 30% in the RCA territory.  . Duplex doppler  05/10/11   LE arterial dopplers demonstrate bilaterally reduced ABIs of 0.91 on right & 0.56 on left. She does report some decreased pain on the left, & there's moderate mixed-density plaque in the right SFA w/50 to 69% reduction. There's a 69% reduction in the left SFA & does appear to be occlusive disease of left posterior tibial artery. Right posterior dorsalis pedis artery demonstrates occlusive disease  . ESOPHAGOGASTRODUODENOSCOPY N/A 04/15/2014   Procedure: ESOPHAGOGASTRODUODENOSCOPY (EGD);  Surgeon: Rachael Fee, MD;  Location: [facility] ENDOSCOPY;  Service: Endoscopy;  Laterality: N/A;  . EYE SURGERY     Left eye surgery; cataract removal  . INSERTION OF DIALYSIS CATHETER N/A 01/06/2013   Procedure: INSERTION OF DIALYSIS CATHETER;  Surgeon: Larina Earthly, MD;  Location: [facility] OR;  Service: Vascular;  Laterality: N/A;  . left bka  07/05/2011  . left lower extremity venous duplex Left 06/27/11  Summary: No evidence of DVT involving the left lower extremity and right common femoral vein.   Marland Kitchen LIGATION ARTERIOVENOUS GORTEX GRAFT Right 03/25/2014   Procedure: LIGATION ARTERIOVENOUS GORTEX GRAFT;  Surgeon: Chuck Hint, MD;  Location: [facility] OR;  Service: Vascular;  Laterality: Right;  . LOWER EXTREMITY ANGIOGRAM N/A 10/31/2011   Procedure: LOWER EXTREMITY ANGIOGRAM;  Surgeon: Chuck Hint, MD;  Location: [facility] CATH LAB;  Service: Cardiovascular;  Laterality: N/A;  . Lower extremity arterial evaluation  06/27/11   SUMMARY: Right: ABI  not ascertained due to false elevation in BP secondary to calcification (posterior tibial artery is non compressible). Left: ABI indicates moderate reduction in arterial flow. Bilateral: Great toe PPG waveforms indicate adequate perfusion. Great toe pressures not obtained due to patient's movements secondary to pain.  Marland Kitchen SHUNTOGRAM N/A 03/17/2014   Procedure: Betsey Amen;  Surgeon: Fransisco Hertz, MD;  Location: [facility] CATH LAB;  Service: Cardiovascular;  Laterality: N/A;   HPI:  [name] an 83 y.o.femalewith past history significant for PVD, memory loss, GERD, anemia, appears heart failure, chronic kidney disease, coronary artery disease, diabetes, gout, hypertension, hypothyroidism presented with slurred speech, right facial droop. Pt hx of left MCA/PCA CVA, right inattention, dysphagia, aphasia. MRI 08/12/16 showed small left acute parietal lobe infarcts, punctate acute right frontal lobe infarct, chronic bilateral scratch sec infarcts in both parietal lobes, left thalamus and right cerebellum. Pt seen by SLP during prior admissions for both swallowing and cognition. Most recent BSE 12/29/15 noted severe aspiration risk, h/o esophageal deficits including dysmotility and diverticulum. Pt advanced to dys 1 (puree) and thin liquids prior to d/c.   Assessment / Plan / Recommendation Clinical Impression  Patient not appropriate for PO intake at this time due to her preexisting dysphagia, severe cognitive deficits and inability to follow commands, attend to stimuli at this time. Patient does not appear to be managing secretions well at this time; noted dried secretions along outside of oral cavity, pt holding lips together tightly and does not open mouth despite max verbal, tactile and visual cues. She is not noted to swallow reflexively or volitionally during exam. Recommend pt remain NPO at this time with strict oral care, upright posture to reduce risk for aspiration of secretions. No family present to  confirm goals of care. She may benefit from palliative consult given her prior level of function and severity of deficits. SLP will follow for PO readiness.   SLP Visit Diagnosis: Dysphagia, unspecified (R13.10)    Aspiration Risk  Severe aspiration risk;Risk for inadequate nutrition/hydration    Diet Recommendation NPO;Alternative means - temporary   Medication Administration: Via alternative means    Other  Recommendations Oral Care Recommendations: Oral care QID Other Recommendations: Remove water pitcher;Have oral suction available   Follow up Recommendations Other (comment) (TBD)      Frequency and Duration min 2x/week  2 weeks       Prognosis Prognosis for Safe Diet Advancement: Fair Barriers to Reach Goals: Severity of deficits;Language deficits;Cognitive deficits;Motivation      Swallow Study   General Date of Onset: 08/12/16 HPI: Julienna Degarmo [name] a 83 y.o.femalewith past history significant for PVD, memory loss, GERD, anemia, appears heart failure, chronic kidney disease, coronary artery disease, diabetes, gout, hypertension, hypothyroidism presented with slurred speech, right facial droop. Pt hx of left MCA/PCA CVA, right inattention, dysphagia, aphasia. MRI 08/12/16 showed small left acute parietal lobe infarcts, punctate acute right frontal lobe infarct, chronic bilateral scratch sec infarcts in both parietal lobes, left  thalamus and right cerebellum. Pt seen by SLP during prior admissions for both swallowing and cognition. Most recent BSE 12/29/15 noted severe aspiration risk, h/o esophageal deficits including dysmotility and diverticulum. Pt advanced to dys 1 (puree) and thin liquids prior to d/c. Type of Study: Bedside Swallow Evaluation Previous Swallow Assessment: see HPI Diet Prior to this Study: NPO Temperature Spikes Noted: No Respiratory Status: Room air History of Recent Intubation: No Behavior/Cognition: Alert;Doesn't follow directions;Distractible Oral  Cavity Assessment: Other (comment) (Unable to assess) Oral Care Completed by SLP: No Oral Cavity - Dentition: Other (Comment) (unable to assess) Vision:  (unable to assess) Self-Feeding Abilities: Refused PO Patient Positioning: Upright in bed Baseline Vocal Quality: Low vocal intensity;Other (comment) ("mmhmm") Volitional Cough: Cognitively unable to elicit Volitional Swallow: Unable to elicit    Oral/Motor/Sensory Function Overall Oral Motor/Sensory Function: Other (comment) (could not assess; pt not following commands)   Ice Chips Ice chips: Not tested   Thin Liquid Thin Liquid: Not tested    Nectar Thick Nectar Thick Liquid: Not tested   Honey Thick Honey Thick Liquid: Not tested   Puree Puree: Not tested   Solid   GO   Solid: Not tested       Rondel Baton, MS, CCC-SLP Speech-Language Pathologist [phone]  Arlana Lindau 08/13/2016,11:03 AM

## 2016-08-13 NOTE — Progress Notes (Signed)
PT Cancellation Note  Patient Details Name: [name] MRN: [id number] DOB: 08/02/1923   Cancelled Treatment:    Reason Eval/Treat Not Completed: PT screened, no needs identified, will sign off. Pt is total care at baseline, lives with her son who is her primary caregiver. Pt's son present in room stating that he wants to take her home and that they have all equipment they need. No further acute PT needs identified. Pt signing off.    Alessandra Bevels Javad Salva 08/13/2016, 1:48 PM

## 2016-08-13 NOTE — Progress Notes (Signed)
1530---[facility] Liaison RN Visit for [place] request from [place], [place] for family interest in [facility]. Chart reviewed. Met with son [name] and two daughters [name] and [name] after meeting patient briefly outside of room to confirm interest, explain services and answer questions. Family verbalized their desire for comfort care and wish for the focus to be on the quality of her life, including eating and drinking understanding this will likely lead to aspiration and pneumonia.  Unfortunately, bed is not available today, however, a liaison will follow up with CSW and family tomorrow re: bed availability.  Left contact information with family if any questions arise.  Thank you, Margaretmary Eddy, [facility] Liaison  [phone]

## 2016-08-13 NOTE — Progress Notes (Signed)
OT Cancellation Note  Patient Details Name: LOLETIA HARMELINK MRN: [id number] DOB: June 02, 1923   Cancelled Treatment:    Reason Eval/Treat Not Completed: OT screened, no needs identified, will sign off. Spoke with PT and pt total care pta and family has all needed DME. Order has also been discontinued.  Evette Georges [phone] 08/13/2016, 3:27 PM

## 2016-08-13 NOTE — Progress Notes (Signed)
08/13/2016 2:19 PM  I had a family meeting with patient's 3 children.  We reviewed her diagnoses and current condition and given the fact that she has had new CVAs and now having difficulty swallowing and speaking family decided to pursue full comfort care.  They desire no further stroke work up or consultation.  They do not want PEG and would prefer to do comfort feeding with the understanding that patient will likely aspirate. They would like to pursue residential hospice placement.  I placed a consult for social worker to assist with placement.  Son is recovering from CVA and it is unlikely that he will be able to care for patient at home.   Maryln Manuel, MD

## 2016-08-14 ENCOUNTER — Encounter (HOSPITAL_COMMUNITY): Payer: Medicare Other

## 2016-08-14 DIAGNOSIS — Z515 Encounter for palliative care: Secondary | ICD-10-CM

## 2016-08-14 DIAGNOSIS — N184 Chronic kidney disease, stage 4 (severe): Secondary | ICD-10-CM

## 2016-08-14 DIAGNOSIS — I639 Cerebral infarction, unspecified: Principal | ICD-10-CM

## 2016-08-14 LAB — HEMOGLOBIN A1C
Hgb A1c MFr Bld: 7.6 % — ABNORMAL HIGH (ref 4.8–5.6)
Mean Plasma Glucose: 171 mg/dL

## 2016-08-14 MED ORDER — PRAVASTATIN SODIUM 40 MG PO TABS
40.0000 mg | ORAL_TABLET | Freq: Every evening | ORAL | Status: DC
  Administered 2016-08-14 – 2016-08-15 (×2): 40 mg via ORAL
  Filled 2016-08-14 (×2): qty 1

## 2016-08-14 MED ORDER — LEVOTHYROXINE SODIUM 100 MCG PO TABS
100.0000 ug | ORAL_TABLET | Freq: Every day | ORAL | Status: DC
  Administered 2016-08-15 – 2016-08-16 (×2): 100 ug via ORAL
  Filled 2016-08-14 (×2): qty 1

## 2016-08-14 MED ORDER — LABETALOL HCL 5 MG/ML IV SOLN
10.0000 mg | INTRAVENOUS | Status: DC | PRN
  Administered 2016-08-14 – 2016-08-15 (×2): 10 mg via INTRAVENOUS
  Filled 2016-08-14 (×2): qty 4

## 2016-08-14 MED ORDER — RESOURCE THICKENUP CLEAR PO POWD
ORAL | Status: DC | PRN
  Filled 2016-08-14: qty 125

## 2016-08-14 NOTE — Progress Notes (Signed)
1500--[facility] Liaison RN note for [facility]  Received phone call from granddaughter (Dr. Laural Benes) placed the call and handed phone to granddaughter, questioning why plan had changed from [facility] yesterday and how one person, the son, could make that decision.  I went to the unit to meet with granddaughter and Dr. Laural Benes and asked Kem Kays, CSW to join. Mitzi Davenport, CSW asked Shane Crutch, CSW to come in her place to participate in meeting. Granddaughter asked that all children, son and two daughters be able to meet and determine what might be best for patient rather than just one child directing decisions, as there were differing opinions and thoughts on what discharge plans should look like among the three children.  Family meeting with patients three children, a grandson and granddaughter-in-law present. Eduard Roux, PMT was also asked to be present to help determine GOC. At this time decision was made to evaluate pt further with PT/OT and speech to determine best discharge plan.   [facility] will continue to follow chart, though at this time pt is not appropriate candidate for [facility]. Asked to hold off on potential referral for Home with Hospice until further evaluation of pt can be accomplished and best setting for discharge...returning home vs SNF with short term rehabilitation.  Greatly appreciate Dr. Laural Benes, Zack Seal, NP PMT and Tonette Bihari, CSW time and professional input in helping to assist this patient and family.  Please feel free to call with hospice related questions or concerns.  Thank you, Haynes Bast, RN [facility] Liaison [phone]

## 2016-08-14 NOTE — Progress Notes (Signed)
  Speech Language Pathology Treatment: Dysphagia  Patient Details Name: [name] MRN: [id number] DOB: 04-19-1923 Today's Date: 08/14/2016 Time: 1520-1540 SLP Time Calculation (min) (ACUTE ONLY): 20 min  Assessment / Plan / Recommendation Clinical Impression  Pt presents today with significant improvement in mentation; responding verbally and following all basic commands, though she is disoriented. Upgraded to dys 1 with thin liquids per MD. Shari Heritage present outside room discussing discharge plans with case management. Reassessed swallowing function for current diet of dysphagia 1 with thin liquids. Patient retrieves boluses of thin liquids via straw, noted with moderate oral holding, though airway protection appears adequate. No overt signs of aspiration noted despite challenging with multiple consecutive swallows of thin liquids in excess of 3oz. Pt accepts limited amount of pureed solids; noted with prolonged oral manipulation and bolus formation, requires extended time for bolus transit with verbal cues. Son states "that's typical for her." States pt had advanced to regular solids at home prior to readmission. Educated son that given pt's prolonged oral stage, absent dentition and need for external cuing for swallow initiation, current diet of dys 1 with thin liquids appears most appropriate at this time. Continue current diet of dys 1, thin liquids, meds crushed in puree. SLP will f/u briefly for tolerance, family education.   HPI HPI: Shannon Saintfleur Enochis a 83 y.o.femalewith past history significant for PVD, memory loss, GERD, anemia, appears heart failure, chronic kidney disease, coronary artery disease, diabetes, gout, hypertension, hypothyroidism presented with slurred speech, right facial droop. Pt hx of left MCA/PCA CVA, right inattention, dysphagia, aphasia. MRI 08/12/16 showed small left acute parietal lobe infarcts, punctate acute right frontal lobe infarct, chronic bilateral scratch sec  infarcts in both parietal lobes, left thalamus and right cerebellum. Pt seen by SLP during prior admissions for both swallowing and cognition. Most recent BSE 12/29/15 noted severe aspiration risk, h/o esophageal deficits including dysmotility and diverticulum. Pt advanced to dys 1 (puree) and thin liquids prior to d/c.      SLP Plan  Continue with current plan of care       Recommendations  Diet recommendations: Dysphagia 1 (puree);Thin liquid Liquids provided via: Straw;Cup Medication Administration: Crushed with puree Supervision: Staff to assist with self feeding Compensations: Slow rate;Small sips/bites;Minimize environmental distractions Postural Changes and/or Swallow Maneuvers: Seated upright 90 degrees                Oral Care Recommendations: Oral care BID Follow up Recommendations: 24 hour supervision/assistance SLP Visit Diagnosis: Dysphagia, unspecified (R13.10) Plan: Continue with current plan of care       GO              Rondel Baton, MS, CCC-SLP Speech-Language Pathologist [phone]  Arlana Lindau 08/14/2016, 3:46 PM

## 2016-08-14 NOTE — Progress Notes (Signed)
Stopped by to visit w/ pt in response to consult for palliative. Nurse said pt didn't say much, but she seemed t alert to surroundings and said hi.. Provided emotional/spiritual support to pt and son, which they appreciated. Chaplain available for f/u.    08/14/16 1000  Clinical Encounter Type  Visited With Patient and family together;Health care provider  Visit Type Initial;Psychological support;Spiritual support;Social support  Referral From Nurse  Spiritual Encounters  Spiritual Needs Prayer;Emotional  Stress Factors  Patient Stress Factors Health changes;Loss of control   Ephraim Hamburger, [address]

## 2016-08-14 NOTE — Clinical Social Work Note (Addendum)
Clinical Social Work Assessment  Patient Details  Name: [name] MRN: [id number] Date of Birth: 01/04/1924  Date of referral:  08/14/16               Reason for consult:  End of Life/Hospice, Discharge Planning, Family Concerns                Permission sought to share information with:  Family Supports Permission granted to share information::  Yes, Verbal Permission Granted  Name::     Laqueta Due  Agency::     Relationship::  Son  Contact Information:  [phone]  Housing/Transportation Living arrangements for the past 2 months:  [place] of Information:  Patient, Adult Children Patient Interpreter Needed:  None Criminal Activity/Legal Involvement Pertinent to Current Situation/Hospitalization:  No - Comment as needed Significant Relationships:  Adult Children Lives with:  Adult Children Do you feel safe going back to the place where you live?  Yes Need for family participation in patient care:  Yes (Comment)  Care giving concerns:  Family expressed concern that son-Albert Hill may no longer be able to adequately care for pt, due to his own medical complications and hospitalizations.    Social Worker assessment / plan:  CSW consulted for residential hospice. CSW was initially informed that family decided yesterday-5/26 to discharge to [facility] once bed available and pt appropriate for residential at that time. Per medical team, pt drastically improved today and does not meet criteria for Hospice residential.   CSW, Palliative NP-Sarah, and [facility] liaison-Tracy met with family at length today to discuss goals of care/discharge planning. Pt from home with son. Pt and son want pt to return home. Pt's daughters-Veronica and [name] want pt to be placed in a SNF, since [facility] is no longer appropriate. Dtrs share they cannot care for pt in the home and feel son can no longer adequately care for pt due to health issues. Son disagrees and feels he provides  the care needed for pt. No POA or Guardianship is in place at this time. Son walked out of the meeting, becoming upset during the conversation with family. Guardianship, insurance, SNF, and Hospice at home were discussed in meeting.    Team also met with pt alone, who states she would like to return home with son. Dtrs, pt and son agreed to allow PT, OT, ST to evaluate and provide recommendations. MD not discharging pt today. They were provided a facility listing to review. Team encouraged son and dtrs to come together to make best decision for pt. CSW is available and will continue following pt for family support and discharge needs.   Employment status:  Retired Passenger transport manager) PT Recommendations:  Not assessed at this time [place] / Referral to community resources:  [place]  Patient/Family's Response to care:  Pt and family appreciative of CSW and medical team support.   Patient/Family's Understanding of and Emotional Response to Diagnosis, Current Treatment, and Prognosis:  Pt alert and oriented x2, but feels she should return home. Son wants pt to return home and feels he can manage pt at home. Son also feels dtrs "just don't want to help." Dtrs feel pt would be better cared for in a SNF and this will also relieve son so that he may focus on his improving his health.   Emotional Assessment Appearance:  Appears stated age Attitude/Demeanor/Rapport:  Other (Appropriate) Affect (typically observed):  Accepting, Pleasant Orientation:  Oriented to Self, Oriented to Situation Alcohol / Substance use:  Other Psych involvement (Current and /or in the community):  No (Comment)  Discharge Needs  Concerns to be addressed:  Care Coordination, Discharge Planning Concerns, Cognitive Concerns, Decision making concerns Readmission within the last 30 days:  No Current discharge risk:  Chronically ill, Dependent with Mobility Barriers to  Discharge:  Continued Medical Work up, Family Issues   Truitt Merle, LCSW 08/14/2016, 4:56 PM

## 2016-08-14 NOTE — Progress Notes (Addendum)
08/14/2016 1:06 PM  Family undecided about disposition, I asked for palliative medicine consult for GOC.   C.Johnson MD

## 2016-08-14 NOTE — Progress Notes (Signed)
0930---[facility] Liaison RN Visit for [facility]  No bed availability today at [facility]. Will continue to follow and advise if bed becomes available.  Thank you, Haynes Bast, RN  [facility] Liaison [phone]  [facility] Liaisons are on AMION.

## 2016-08-14 NOTE — Progress Notes (Signed)
Pt's granddaughter Shanda Bumps approached me in ED (where she works) to ask about advanced directive process, saying I'd seen her grandmother this morning, and her great-uncle and and other relatives were now disagreeing about the best disposition for pt. I explained that the decision to execute an advanced directive and to make all the choices involved requires that pt be ale to make her own decisions. Granddaugjhter will obtain copy of form from ED registration and share that information with her relatives.   08/14/16 1400  Clinical Encounter Type  Visited With Family  Visit Type Follow-up  Referral From Family   Ephraim Hamburger, Chaplain

## 2016-08-14 NOTE — Progress Notes (Signed)
1000--[facility] Liaison RN Visit note for [facility]  Stopped into room to notify son that beds were not currently available at [facility] today. He expressed that he hoped "she has been misdiagnosed". When I inquired further about this, he stated that the patient has been eating and drinking without any difficulty since yesterday. He denies that she has had any coughing. Then proceeded to show me how she could drink coke from a cup and straw without difficulty. Pt is much more alert and interactive today and does not appear [employer] appropriate.   The son expresses that the patient is at the same baseline she was prior to coming to the hospital. He states that his sisters wish for their mother to go to [facility] and he doesn't feel he has a say in it. He shares there is no formal HCPOA established, though he has been the single person caring for his mother since 20. The son asks the mother/patient where she wishes to go and she states "home".  We further discussed that the patient does not appear [facility] appropriate at this time and that if it is the desire to take her home then we could evaluate if Home with Hospice would be appropriate or if the family did not feel she could be cared for in the home then SNF may be appropriate. Direction on discharge needs to come from the physician. The son is reluctant for pt to return to SNF for it was expressed that she was in a facility for a brief period of time and experienced a couple of falls out of the bed and family was not notified.  Notified CSW Kem Kays of discussion who states that she will reach out to Dr. Laural Benes.  1240--stopped by room with Kem Kays, CSW. Son with patient and states that sisters do not support his decision to take her home. States that they are both angry with him and have stormed out of hospital room.   Kem Kays, CSW will reach out to Dr. Laural Benes and family to help determine direction.   [facility] will  step out at this time until further decisions can be made. Will continue to follow medical record.  Please feel free to call with hospice related questions or concerns.  Thank you, Haynes Bast, RN [facility] Liaison [phone]

## 2016-08-14 NOTE — Consult Note (Signed)
Consultation Note Date: 08/14/2016   Patient Name: [name]  DOB: [birth year]  MRN: [id number]  Age / Sex: 83 y.o., female  PCP: Shannon Fick, MD Referring Physician: Cleora Fleet, MD  Reason for Consultation: Hospice Evaluation and Psychosocial/spiritual support  HPI/Patient Profile: 83 y.o. female  with past medical history of Anemia, heart failure, chronic kidney disease, coronary artery disease, peripheral vascular disease, diabetes, reflux, gout, hypertension, hypothyroidism, history of CVA admitted on 08/12/2016 with new acute stroke. Patient has had a waxing and waning clinical appearance since admission. .  Consult ordered for further goals of care but more specifically disposition as her children who have, until meeting today, based on chart review, have been in agreement with her returning to her home with her son [name] as her caregiver.  Clinical Assessment and Goals of Care: Patient is well known to palliative medicine services. She has been seen back in 2014 and most recently January 2018 and February 2018 in addition to this consultation today. Family was originally hopeful for residential hospice as patient appeared to be nearing end-of-life as evidenced by lack of responsiveness, no by mouth intake. When seen by hospice and palliative care of [facility] liaison on 08/14/2016, patient was alert, speaking clearly and eating without overt evidence of aspiration. She is a poor historian with memory deficits clearly evident, for example she does not know who the president is, she did not know she was in the hospital. She does recognize her family.  Patient has not designated a Public affairs consultant of attorney. In the past they have indicated in each other's presence that daughter [name] is her healthcare power of attorney. In the past during multiple family meetings  with all 3  of patient's children  presen (up until this admission) have all been in agreement with patient returning home in the care of [name] despite his poor health.  Today's meeting with her 2 daughters, and son, are now indicating that the house is squalid, that [name] will not let nurses or other care providers into the home, that he has taken her out of a skilled nursing facility without their knowledge or permission, and that patient is afraid of him. In reviewing palliative medicine notes and previous goals of care meeting with all 3 of the children none of these issues were brought up until this meeting today. CSW, [name], myself, as well as [facility] hospital liasion, [name] were present for this meeting. Son is wanting to take her home and both daughters are wanting her to go to SNF. Daughters were hopeful that she would qualify for residential hospice but pt has rallied today and does not meet  criteria  Additionally we spoke to pt without family present and its her desire to go home and she does not see herself as being as dependent on others for care as she is.     SUMMARY OF RECOMMENDATIONS   Concur with DNR/DNI Clinical social worker recommended that a family member seek guardianship Discussed options of returning home with  home health versus skilled nursing facility for short-term rehabilitation Patient has been in [facility], [facility], as well as [facility] Recommend that physical therapy occupational therapy and speech therapy weight in as to patient's current capabilities and the recommendations Clinical social worker to give list of facilities to patient's daughter's who at this point seem prepared to pursue skilled nursing facility versus returning home in their brothers care. Patient's son would like for her to return home in his care as she has before Also recommend that if patient is discharged to a skilled nursing facility that palliative care follow-up at the facility to continue to  help facilitate these discussions. Palliative care division of [facility] can be reached at [phone]. This recommendation would need to be placed on discharge summary for the physician at the facility to initiate palliative consult in the facility   Code Status/Advance Care Planning:  DNR   Palliative Prophylaxis:   Aspiration, Bowel Regimen, Delirium Protocol, Eye Care, Frequent Pain Assessment, Oral Care and Turn Reposition  Additional Recommendations (Limitations, Scope, Preferences):  Full Scope Treatment except for DO NOT RESUSCITATE DO NOT INTUBATE  Psycho-social/Spiritual:   Desire for further Chaplaincy support:no  Additional Recommendations: Clinical social worker to give family members guardianship paperwork  Prognosis:   < 6 months in the setting of new acute stroke, history of stroke, congestive heart failure, chronic kidney disease stage IV. Patient is bedbound at baseline  Discharge Planning: Skilled Nursing Facility for rehab with Palliative care service follow-up      Primary Diagnoses: Present on Admission: . CAD - moderate at cath July 2012 (medical Rx) . Dyslipidemia . Chronic renal insufficiency, stage IV (severe)- HD started 01/05/13 . Diabetes mellitus type 2 with peripheral artery disease (HCC) . Peripheral vascular disease (HCC) . Essential hypertension . Slurred speech . Acute CVA (cerebrovascular accident) (HCC)   I have reviewed the medical record, interviewed the patient and family, and examined the patient. The following aspects are pertinent.  Past Medical History:  Diagnosis Date  . Abnormal nuclear stress test 06/01/09   Demonstrated a new area of infarct scar, peri-infarct ischemia seen in the inferolateral territory. EF eas normal at 70% with mild hypocontractility at the apex, distal inferolateral wall.  . Anemia   . Arthritis   . Cardiomyopathy, idiopathic (HCC) 02/08/2011  . Chronic diastolic CHF  (congestive heart failure) (HCC)    Takes Lasix  . Chronic kidney disease (CKD), stage IV (severe) (HCC)   . Coronary artery disease    a. Cath 09/2010 - med rx.  . Diabetes mellitus    type 2 NIDDM  . Dyslipidemia   . GERD (gastroesophageal reflux disease)   . Gout    takes allopurinol  . H/O echocardiogram 09/06/11   Indication- nonIschemic Cardiomyopathy. EF = now greater than 55% with no regional wall motion abnormalities. Tthere is mild to moderate trisuspid regurgitayion and mild pulmonary hypertension with an RVSP of 35 mmHg as well as stage 1 diastolic dysfunction and mild to moderate LVH.  Marland Kitchen Hx of transient ischemic attack (TIA)   . Hypertension   . Hypothyroidism    (SEVERE) Takes Levothryroxine  . Irregular heartbeat   . Memory loss   . Myocardial infarct (HCC)    x 3 unsure of years  . Nonischemic cardiomyopathy (HCC)    EF now is 55%, reduced due to myxedema, which is improved.  . Peripheral neuropathy   . Peripheral vascular disease (HCC)    a. s/p L BKA.  Marland Kitchen  Shingles   . Stroke (HCC)   . TIA (transient ischemic attack)   . Ulcer    Social History   Social History  . Marital status: Widowed    Spouse name: N/A  . Number of children: 4  . Years of education: N/A   Occupational History  . homemaker    Social History Main Topics  . Smoking status: Former Smoker    Packs/day: 0.50    Years: 40.00    Quit date: 07/05/1986  . Smokeless tobacco: Former Neurosurgeon     Comment: quit smoking 1988  . Alcohol use No  . Drug use: No  . Sexual activity: No   Other Topics Concern  . None   Social History Narrative  . None   Family History  Problem Relation Age of Onset  . Heart attack Daughter   . Heart disease Daughter        Before age 31  . Cancer Sister        STOMACH  . Diabetes Sister   . Cancer Brother        BONE  . Diabetes Brother   . Hyperlipidemia Daughter   . Hypertension Daughter   . Heart disease Daughter        before age 44  . Kidney  disease Daughter   . Other Daughter        varicose veins  . Diabetes Daughter   . Heart disease Son        before age 106  . Hyperlipidemia Son   . Hypertension Son   . Heart attack Son   . Anesthesia problems Neg Hx   . Hypotension Neg Hx   . Malignant hyperthermia Neg Hx   . Pseudochol deficiency Neg Hx    Scheduled Meds: . aspirin  300 mg Rectal Daily   Or  . aspirin  325 mg Oral Daily  . heparin  5,000 Units Subcutaneous Q8H  . metoprolol tartrate  5 mg Intravenous Q12H   Continuous Infusions: . sodium chloride 20 mL/hr at 08/13/16 1218   PRN Meds:.[DISCONTINUED] acetaminophen **OR** acetaminophen (TYLENOL) oral liquid 160 mg/5 mL **OR** [DISCONTINUED] acetaminophen, acetaminophen **OR** acetaminophen, antiseptic oral rinse, atropine, hydrALAZINE, ipratropium-albuterol, labetalol, LORazepam **OR** LORazepam **OR** LORazepam, morphine injection, ondansetron **OR** ondansetron (ZOFRAN) IV, polyvinyl alcohol Medications Prior to Admission:  Prior to Admission medications   Medication Sig Start Date End Date Taking? Authorizing Provider  acetaminophen (TYLENOL) 325 MG tablet Take 650 mg by mouth every 6 (six) hours as needed for mild pain.   Yes [provider]  albuterol-ipratropium (COMBIVENT) 18-103 MCG/ACT inhaler Inhale 1-2 puffs into the lungs at bedtime as needed for wheezing or shortness of breath.    Yes [provider]  allopurinol (ZYLOPRIM) 100 MG tablet Take 100 mg by mouth daily.    Yes [provider]  aspirin EC 81 MG tablet Take 81 mg by mouth daily.   Yes [provider]  calcitRIOL (ROCALTROL) 0.25 MCG capsule Take 0.25 mcg by mouth every other day.  11/04/12  Yes [provider]  Calcium Carbonate-Vitamin D (CALCIUM-VITAMIN D) 500-200 MG-UNIT tablet Take 1 tablet by mouth daily.   Yes [provider]  Cholecalciferol (VITAMIN D PO) Take 1 capsule by mouth daily.   Yes [provider]  colchicine 0.6  MG tablet Take two tablets by mouth 06/07/16 and then take one tablet daily until symptoms resolve. 06/07/16  Yes Hilty, Lisette Abu, MD  docusate sodium (COLACE) 100 MG capsule  Take 100 mg by mouth daily as needed for mild constipation.   Yes [provider]  famotidine (PEPCID) 20 MG tablet Take 1 tablet (20 mg total) by mouth at bedtime. Patient taking differently: Take 20 mg by mouth at bedtime as needed for heartburn.  11/13/15  Yes Rodolph Bong, MD  feeding supplement (BOOST / RESOURCE BREEZE) LIQD Take 1 Container by mouth 3 (three) times daily between meals. 11/13/15  Yes Rodolph Bong, MD  furosemide (LASIX) 80 MG tablet Take 1 tablet (80 mg total) by mouth 2 (two) times daily. 04/22/16  Yes Maxie Barb, MD  gabapentin (NEURONTIN) 300 MG capsule Take 1 capsule (300 mg total) by mouth at bedtime. 04/22/16  Yes Maxie Barb, MD  hydrALAZINE (APRESOLINE) 50 MG tablet Take 1 tablet (50 mg total) by mouth 2 (two) times daily. Patient taking differently: Take 50 mg by mouth 3 (three) times daily.  10/22/15  Yes Hilty, Lisette Abu, MD  isosorbide dinitrate (ISORDIL) 20 MG tablet Take 1 tablet (20 mg total) by mouth 3 (three) times daily. 04/22/16  Yes Maxie Barb, MD  levothyroxine (SYNTHROID, LEVOTHROID) 100 MCG tablet Take 1 tablet (100 mcg total) by mouth daily. 04/22/16  Yes Maxie Barb, MD  metoCLOPramide (REGLAN) 5 MG tablet Take 1 tablet (5 mg total) by mouth every 6 (six) hours as needed for nausea. 01/04/16  Yes Alison Murray, MD  metoprolol tartrate (LOPRESSOR) 25 MG tablet Take 0.5 tablets (12.5 mg total) by mouth 2 (two) times daily. 04/22/16  Yes Maxie Barb, MD  potassium chloride 20 MEQ/15ML (10%) SOLN Take 15 mLs (20 mEq total) by mouth daily. 04/22/16  Yes Maxie Barb, MD  pravastatin (PRAVACHOL) 40 MG tablet Take 40 mg by mouth every evening.    Yes [provider]  sevelamer carbonate (RENVELA) 0.8 g PACK packet  Take 0.8 g by mouth 3 (three) times daily with meals. 04/22/16  Yes Maxie Barb, MD  sodium bicarbonate 650 MG tablet Take 1 tablet (650 mg total) by mouth daily. 11/13/15  Yes Rodolph Bong, MD  aspirin 325 MG tablet Take 1 tablet (325 mg total) by mouth daily. Patient not taking: Reported on 08/12/2016 01/04/16   Alison Murray, MD   Allergies  Allergen Reactions  . Aspirin Nausea And Vomiting and Other (See Comments)    325 mg (adult strength) [name]    Review of Systems  Unable to perform ROS: Mental status change    Physical Exam  Constitutional:  Frail elderly female in no acute distress  Pulmonary/Chest: Effort normal.  Neurological: She is alert.  Oriented to self only. Did not realize that she was in the hospital. She does recognize her children. No insight into her current clinical conditions or abilities  Skin: Skin is warm and dry.  Psychiatric:  Patient is alert and oriented to self. Recognizes her family. Patient is bedbound at home and dependent on her son for all care but insists that she can care for herself in terms of transferring to a wheelchair and providing for other needs. Shows very poor insight into current physical status  Nursing note and vitals reviewed.   Vital Signs: BP (!) 185/53 (BP Location: Right Arm)   Pulse 61   Temp 98.4 F (36.9 C) (Oral)   Resp 16   Ht 4\' 9"  (1.448 m)   Wt 50.8 kg (112 lb)  SpO2 97%   BMI 24.24 kg/m  Pain Assessment: No/denies pain       SpO2: SpO2: 97 % O2 Device:SpO2: 97 % O2 Flow Rate: .   IO: Intake/output summary:  Intake/Output Summary (Last 24 hours) at 08/14/16 1553 Last data filed at 08/14/16 1451  Gross per 24 hour  Intake           2116.5 ml  Output                0 ml  Net           2116.5 ml    LBM:   Baseline Weight: Weight: 50.8 kg (112 lb) Most recent weight: Weight: 50.8 kg (112 lb)     Palliative  Assessment/Data:   Flowsheet Rows     Most Recent Value  Intake Tab  Referral Department  Hospitalist  Unit at Time of Referral  Other (Comment)  Palliative Care Primary Diagnosis  Neurology  Date Notified  08/14/16  Date of Admission  08/12/16  Date first seen by Palliative Care  08/14/16  # of days Palliative referral response time  0 Day(s)  # of days IP prior to Palliative referral  2  Clinical Assessment  Palliative Performance Scale Score  30%  Pain Max last 24 hours  Not able to report  Pain Min Last 24 hours  Not able to report  Dyspnea Max Last 24 Hours  Not able to report  Dyspnea Min Last 24 hours  Not able to report  Nausea Max Last 24 Hours  Not able to report  Nausea Min Last 24 Hours  Not able to report  Anxiety Max Last 24 Hours  Not able to report  Anxiety Min Last 24 Hours  Not able to report  Other Max Last 24 Hours  Not able to report  Psychosocial & Spiritual Assessment  Palliative Care Outcomes  Patient/Family meeting held?  Yes  Who was at the meeting?  pt, 2 daughters, and son, granddaughter and SIL  Palliative Care follow-up planned  No      Time In: 1500 Time Out: 1615 Time Total: 75 min Greater than 50%  of this time was spent counseling and coordinating care related to the above assessment and plan. Staffed with Dr. Francesca Jewett CSW  Signed by: Irean Hong, NP   Please contact Palliative Medicine Team phone at [phone] for questions and concerns.  For individual provider: See Loretha Stapler

## 2016-08-14 NOTE — Progress Notes (Signed)
PROGRESS NOTE    [name]  ZOX:[id number]  DOB: 05/18/1923  DOA: 08/12/2016 PCP: Georgianne Fick, MD Outpatient Specialists:   Hospital course: [name] is a 83 y.o. female  with past history significant for anemia, appears heart failure, chronic kidney disease, coronary artery disease, diabetes, reflux, gout, hypertension, hypothyroidism presents with family with chief complaint of slurred speech and acute CVA.   Assessment & Plan:   Small acute left parietal lobe infarcts. Punctate acute right frontal lobe infarct. Pt continues to have Slurred speech and has been assessed by SLP and kept NPO for HIGH aspiration risk.  Family doesn't seem to think feeding tube and PEG is something patient would want or tolerate, they are discussing among themselves.  Pt wanting to eat and drink orally.  Discussed with family at bedside.  They favor full comfort care.   Continue aspirin.   Mild dehydration Treated with Gentle IV fluids, now drinking  Hypertension Permissive hypertension When necessary hydralazine 10 mg IV as needed for severe blood pressure Hold oral hydralazine  Diabetes Mellitus, type 2  Sliding scale insulin  CBG (last 3)   Recent Labs  08/12/16 1019  GLUCAP 145*   Hyperlipidemia Resume pravastatin  CHF cardiomyopathy with EF 20%.   Lasix on hold for now Lopressor IV in place of PO Reviewed Echo from 1/18.   Chronic pain Hold gabapentin  CAD Hold isosorbide  Hypothyroidism Resume levothyroxine  CKD IV Hold renvela, sodium bicarb  GERD Order IV pepcid if needed  Constipation Hold colace  Hyperlipidemia hold statin  Gout Hold allopurinol  Code Status: DNR  DVT Prophylaxis: heparin Family Communication: daughter and son at bedside Disposition Plan: likely SNF  Subjective: Pt has been eating and drinking much better   Objective: Vitals:   08/14/16 0531 08/14/16 0552 08/14/16 0900 08/14/16 1300  BP: (!)  197/50 (!) 213/79 (!) 208/55 (!) 185/53  Pulse: 61     Resp: 18  16 16   Temp:   97.7 F (36.5 C) 98.4 F (36.9 C)  TempSrc:    Oral  SpO2: 99%  98% 97%  Weight:      Height:        Intake/Output Summary (Last 24 hours) at 08/14/16 1659 Last data filed at 08/14/16 1451  Gross per 24 hour  Intake           2116.5 ml  Output                0 ml  Net           2116.5 ml   Filed Weights   08/12/16 1019  Weight: 50.8 kg (112 lb)   Exam:  General exam: awake alert NAD appears comfortable.  Respiratory system:  No increased work of breathing. Cardiovascular system: S1 & S2 heard.  Gastrointestinal system: Abdomen is nondistended, soft and nontender. Normal bowel sounds heard. Central nervous system: Alert and oriented. Pronounced facial droop.  Extremities: no CCE.  Data Reviewed: Basic Metabolic Panel:  Recent Labs Lab 08/12/16 1057 08/12/16 1110 08/12/16 1802  NA 141 142  --   K 4.1 4.1  --   CL 109 110  --   CO2 23  --   --   GLUCOSE 162* 157*  --   BUN 46* 49*  --   CREATININE 2.89* 3.00*  --   CALCIUM 8.9  --   --   MG  --   --  1.8  PHOS  --   --  4.1   Liver Function Tests:  Recent Labs Lab 08/12/16 1057  AST 29  ALT 20  ALKPHOS 102  BILITOT 0.6  PROT 6.5  ALBUMIN 3.1*   No results for input(s): LIPASE, AMYLASE in the last 168 hours. No results for input(s): AMMONIA in the last 168 hours. CBC:  Recent Labs Lab 08/12/16 1057 08/12/16 1110  WBC 8.5  --   NEUTROABS 5.2  --   HGB 10.7* 11.2*  HCT 33.6* 33.0*  MCV 93.9  --   PLT 349  --    Cardiac Enzymes:  Recent Labs Lab 08/12/16 1802  TROPONINI 0.03*   CBG (last 3)   Recent Labs  08/12/16 1019  GLUCAP 145*   No results found for this or any previous visit (from the past 240 hour(s)).   Studies: No results found. Scheduled Meds: . aspirin  300 mg Rectal Daily   Or  . aspirin  325 mg Oral Daily  . heparin  5,000 Units Subcutaneous Q8H  . metoprolol tartrate  5 mg  Intravenous Q12H   Continuous Infusions: . sodium chloride 20 mL/hr at 08/13/16 1218   Principal Problem:   Acute CVA (cerebrovascular accident) [facility]) Active Problems:   CAD - moderate at cath July 2012 (medical Rx)   Peripheral vascular disease (HCC)   Dyslipidemia   Chronic renal insufficiency, stage IV (severe)- HD started 01/05/13   Diabetes mellitus type 2 with peripheral artery disease (HCC)   Essential hypertension   Slurred speech  Time spent:   Standley Dakins, MD, FAAFP [facility] Pager [phone]  If 7PM-7AM, please contact night-coverage [email or web address] Password [email or web address] 08/14/2016, 4:59 PM    LOS: 1 day

## 2016-08-15 MED ORDER — HYDRALAZINE HCL 50 MG PO TABS
50.0000 mg | ORAL_TABLET | Freq: Three times a day (TID) | ORAL | Status: DC
  Administered 2016-08-15: 50 mg via ORAL
  Filled 2016-08-15 (×3): qty 1

## 2016-08-15 NOTE — Progress Notes (Signed)
PROGRESS NOTE    [name]  CLE:[id number]  DOB: 25-Jun-1923  DOA: 08/12/2016 PCP: Georgianne Fick, MD Outpatient Specialists:  Hospital course: [name] is a 83 y.o. female  with past history significant for anemia, appears heart failure, chronic kidney disease, coronary artery disease, diabetes, reflux, gout, hypertension, hypothyroidism presents with family with chief complaint of slurred speech and acute CVA.   Assessment & Plan:   Small acute left parietal lobe infarcts. Punctate acute right frontal lobe infarct. Pt continues to have Slurred speech and has been assessed by SLP and kept NPO for HIGH aspiration risk.  Family doesn't seem to think feeding tube and PEG is something patient would want or tolerate, they are discussing among themselves.  Pt wanting to eat and drink orally.  Discussed with family at bedside.  They favor full comfort care.   Continue aspirin.   Mild dehydration Treated with Gentle IV fluids, now drinking and eating  Hypertension Permissive hypertension When necessary hydralazine 10 mg IV as needed for severe blood pressure Hold oral hydralazine  Diabetes Mellitus, type 2   Hyperlipidemia Resume pravastatin  CHF cardiomyopathy with EF 20%.   Lasix on hold for now Lopressor IV in place of PO Reviewed Echo from 1/18.   Chronic pain Hold gabapentin  CAD Hold isosorbide  Hypothyroidism Resume levothyroxine  CKD IV Hold renvela, sodium bicarb  GERD Order IV pepcid if needed  Constipation Hold colace  Hyperlipidemia hold statin  Gout Hold allopurinol  Code Status: DNR  DVT Prophylaxis: heparin Family Communication: daughter and son at bedside Disposition Plan: family undecided  Subjective: Pt has been eating and drinking better, pt speaking better  Objective: Vitals:   08/15/16 0526 08/15/16 0624 08/15/16 0920 08/15/16 1414  BP: (!) 136/117 (!) 175/42 (!) 153/88 (!) 189/45  Pulse:   67 (!)  57  Resp:   20 18  Temp:   97.4 F (36.3 C) 98.4 F (36.9 C)  TempSrc:   Oral Oral  SpO2:   99% 96%  Weight:      Height:        Intake/Output Summary (Last 24 hours) at 08/15/16 1419 Last data filed at 08/14/16 1451  Gross per 24 hour  Intake              240 ml  Output                1 ml  Net              239 ml   Filed Weights   08/12/16 1019  Weight: 50.8 kg (112 lb)   Exam:  General exam: awake alert NAD appears comfortable.  Respiratory system:  No increased work of breathing. Cardiovascular system: S1 & S2 heard.  Gastrointestinal system: Abdomen is nondistended, soft and nontender. Normal bowel sounds heard. Central nervous system: Alert and oriented. Pronounced facial droop.  Extremities: no CCE.  Data Reviewed: Basic Metabolic Panel:  Recent Labs Lab 08/12/16 1057 08/12/16 1110 08/12/16 1802  NA 141 142  --   K 4.1 4.1  --   CL 109 110  --   CO2 23  --   --   GLUCOSE 162* 157*  --   BUN 46* 49*  --   CREATININE 2.89* 3.00*  --   CALCIUM 8.9  --   --   MG  --   --  1.8  PHOS  --   --  4.1   Liver Function Tests:  Recent Labs Lab 08/12/16 1057  AST 29  ALT 20  ALKPHOS 102  BILITOT 0.6  PROT 6.5  ALBUMIN 3.1*   No results for input(s): LIPASE, AMYLASE in the last 168 hours. No results for input(s): AMMONIA in the last 168 hours. CBC:  Recent Labs Lab 08/12/16 1057 08/12/16 1110  WBC 8.5  --   NEUTROABS 5.2  --   HGB 10.7* 11.2*  HCT 33.6* 33.0*  MCV 93.9  --   PLT 349  --    Cardiac Enzymes:  Recent Labs Lab 08/12/16 1802  TROPONINI 0.03*   CBG (last 3)  No results for input(s): GLUCAP in the last 72 hours. No results found for this or any previous visit (from the past 240 hour(s)).   Studies: No results found. Scheduled Meds: . aspirin  300 mg Rectal Daily   Or  . aspirin  325 mg Oral Daily  . heparin  5,000 Units Subcutaneous Q8H  . levothyroxine  100 mcg Oral QAC breakfast  . metoprolol tartrate  5 mg  Intravenous Q12H  . pravastatin  40 mg Oral QPM   Continuous Infusions: . sodium chloride 20 mL/hr at 08/15/16 1359   Principal Problem:   Acute CVA (cerebrovascular accident) [facility]) Active Problems:   CAD - moderate at cath July 2012 (medical Rx)   Peripheral vascular disease (HCC)   Dyslipidemia   Chronic renal insufficiency, stage IV (severe)- HD started 01/05/13   Diabetes mellitus type 2 with peripheral artery disease (HCC)   Essential hypertension   Slurred speech  Time spent:   Standley Dakins, MD, FAAFP [facility] Pager [phone]  If 7PM-7AM, please contact night-coverage [email or web address] Password [email or web address] 08/15/2016, 2:19 PM    LOS: 2 days

## 2016-08-15 NOTE — Progress Notes (Signed)
Physical Therapy Treatment Patient Details Name: [name] MRN: [id number] DOB: 1923/10/16 Today's Date: 08/15/2016    History of Present Illness [name] is a 83 y.o. female with past history significant for L BKA, anemia, heart failure, chronic kidney disease, coronary artery disease, diabetes, reflux, gout, hypertension, hypothyroidism presents with family with chief complaint of slurred speech and acute CVA.     PT Comments    Pt with decreased attention and participation this session. Pt followed commands ~50% of the time. Recommend use of mechanical lift OOB and +2 assist for safety during any mobility with nursing staff. Continue to feel that SNF would be an appropriate d/c option to decrease burden of care on family. Will continue to follow.   Follow Up Recommendations  SNF;Supervision/Assistance - 24 hour     Equipment Recommendations  None recommended by PT    Recommendations for Other Services       Precautions / Restrictions Precautions Precautions: Fall Precaution Comments: L BKA Restrictions Weight Bearing Restrictions: No    Mobility  Bed Mobility Overal bed mobility: Needs Assistance Bed Mobility: Rolling Rolling: Total assist;+2 for safety/equipment Sidelying to sit: Max assist       General bed mobility comments: Focus of session was to assist NT with bed mobility and linen change. Hand-over-hand assist to reach for rail, however pt made no effort to pull herself over to s/l. Bed pad used to assist in full roll and max assist was provided to keep pt in s/l position until peri-care was complete and linen was changed. +2 required for scoot to [facility] and positioning at end of session.   Transfers                 General transfer comment: need lift equpiment  Ambulation/Gait                 Stairs            Wheelchair Mobility    Modified Rankin (Stroke Patients Only) Modified Rankin (Stroke Patients Only) Pre-Morbid  Rankin Score: Severe disability Modified Rankin: Severe disability     Balance Overall balance assessment: Needs assistance                                          Cognition Arousal/Alertness: Lethargic Behavior During Therapy: Flat affect Overall Cognitive Status: History of cognitive impairments - at baseline Area of Impairment: Orientation;Memory;Following commands;Safety/judgement;Awareness;Problem solving                 Orientation Level: Disoriented to;Time;Person;Situation   Memory: Decreased short-term memory Following Commands: Follows one step commands with increased time Safety/Judgement: Decreased awareness of safety;Decreased awareness of deficits Awareness: Intellectual Problem Solving: Slow processing;Difficulty sequencing;Requires verbal cues;Requires tactile cues General Comments: Very flat and requiring increased time to answer questions. Following commands ~50% of the time.       Exercises      General Comments        Pertinent Vitals/Pain Pain Assessment: Faces Faces Pain Scale: Hurts a little bit Pain Location: Generalized grimacing with movement Pain Descriptors / Indicators: Grimacing Pain Intervention(s): Limited activity within patient's tolerance;Monitored during session;Repositioned    Home Living Family/patient expects to be discharged to:: Private residence Living Arrangements: Children Available Help at Discharge: Family;Available 24 hours/day Type of Home: House Home Access: Ramped entrance   Home Layout: One level Home Equipment: Dan Humphreys -  2 wheels;Bedside commode;Tub bench;Hospital bed;Wheelchair - IT trainer;Shower seat Additional Comments: son reports having hoyer lift but prefer to transfer her personally    Prior Function Level of Independence: Needs assistance  Gait / Transfers Assistance Needed: total lift for bed - w/c transfers ADL's / Homemaking Assistance Needed: total A with all ADL,  including feeding     PT Goals (current goals can now be found in the care plan section) Acute Rehab PT Goals Patient Stated Goal: Home at d/c PT Goal Formulation: With patient Time For Goal Achievement: 08/29/16 Potential to Achieve Goals: Fair Progress towards PT goals: Progressing toward goals    Frequency    Min 2X/week      PT Plan Current plan remains appropriate    Co-evaluation              AM-PAC PT "6 Clicks" Daily Activity  Outcome Measure  Difficulty turning over in bed (including adjusting bedclothes, sheets and blankets)?: Total Difficulty moving from lying on back to sitting on the side of the bed? : Total Difficulty sitting down on and standing up from a chair with arms (e.g., wheelchair, bedside commode, etc,.)?: Total Help needed moving to and from a bed to chair (including a wheelchair)?: Total Help needed walking in hospital room?: Total Help needed climbing 3-5 steps with a railing? : Total 6 Click Score: 6    End of Session   Activity Tolerance: Patient limited by fatigue Patient left: in bed (NT present) Nurse Communication: Mobility status PT Visit Diagnosis: Muscle weakness (generalized) (M62.81);Other symptoms and signs involving the nervous system (R29.898)     Time: 1610-9604 PT Time Calculation (min) (ACUTE ONLY): 11 min  Charges:  $Therapeutic Activity: 8-22 mins                    G Codes:       Conni Slipper, PT, DPT Acute Rehabilitation Services Pager: [phone]    Marylynn Pearson 08/15/2016, 2:55 PM

## 2016-08-15 NOTE — Progress Notes (Signed)
Occupational Therapy Evaluation Patient Details Name: [name] MRN: [id number] DOB: 1923/10/03 Today's Date: 08/15/2016    History of Present Illness [name] is a 83 y.o. female with past history significant for L BKA, anemia, heart failure, chronic kidney disease, coronary artery disease, diabetes, reflux, gout, hypertension, hypothyroidism presents with family with chief complaint of slurred speech and acute CVA.    Clinical Impression   PTA, pt lived at home with son and required total A for all ADL, including feeding, and mobility. Pt is not able to stand and pivot on RLE, therefore, requires total A to lift pt from bed - chair. Pt will require 24/7 S and total A for ADL and use of diligent lift equipment (hoyer) for safe transfer. Per daughters, this is her baseline. Pt with limited participation with OT this pm. If family can not provide necessary level of care at home, pt will need SNF.  Will follow acutely to address ADL and postioning to facilitate DC to next venue of care.  Daughters present for evaluation and state that they do not feel that their brother can provide necessary level of care for their mother any longer and that "it has taken a toll on his health".  Recommend use of prevalon boot to decrease pressure on R heel.    Follow Up Recommendations  Supervision/Assistance - 24 hour    Equipment Recommendations  Other (comment) (hoyer)    Recommendations for Other Services       Precautions / Restrictions Precautions Precautions: Fall Precaution Comments: L BKA Restrictions Weight Bearing Restrictions: No      Mobility Bed Mobility Overal bed mobility: Needs Assistance Bed Mobility: Rolling;Sidelying to Sit;Sit to Sidelying Rolling: Total assist Sidelying to sit: Max assist     Sit to sidelying: Mod assist General bed mobility comments: Pt's hand placed on railing however, pt would not grasp railing. Attmpted to have pt roll to side, pt required  Total A to rol.  Transfers                 General transfer comment: need lift equpiment    Balance Overall balance assessment: Needs assistance Sitting-balance support: Feet supported;Bilateral upper extremity supported Sitting balance-Leahy Scale: Poor Sitting balance - Comments: Able to hold herself up at EOB with BUE support but not able to maintain.  Postural control: Posterior lean;Left lateral lean                                 ADL either performed or assessed with clinical judgement   ADL Overall ADL's : At baseline                                       General ADL Comments: total A     Vision Baseline Vision/History: Cataracts Additional Comments: visioin deficits at baeline     Perception     Praxis      Pertinent Vitals/Pain Pain Assessment: Faces Faces Pain Scale: Hurts little more Pain Location: when moving RLE Pain Descriptors / Indicators: Grimacing Pain Intervention(s): Limited activity within patient's tolerance     Hand Dominance Right   Extremity/Trunk Assessment Upper Extremity Assessment Upper Extremity Assessment: Generalized weakness   Lower Extremity Assessment Lower Extremity Assessment: Defer to PT evaluation LLE Deficits / Details: Noted knee flexion contracture ~30   Cervical /  Trunk Assessment Cervical / Trunk Assessment: Kyphotic   Communication Communication Communication: HOH   Cognition Arousal/Alertness: Awake/alert Behavior During Therapy: Flat affect;Agitated Overall Cognitive Status: History of cognitive impairments - at baseline Area of Impairment: Orientation;Memory;Following commands;Safety/judgement;Awareness;Problem solving                 Orientation Level: Disoriented to;Time;Person;Situation   Memory: Decreased short-term memory Following Commands: Follows one step commands with increased time Safety/Judgement: Decreased awareness of safety;Decreased awareness of  deficits Awareness: Intellectual Problem Solving: Slow processing;Difficulty sequencing;Requires verbal cues;Requires tactile cues General Comments: Pt unable to recall DOB.    General Comments    Pt states "if you hurt my foot I will kick you"   Exercises     Shoulder Instructions      Home Living Family/patient expects to be discharged to:: Private residence Living Arrangements: Children Available Help at Discharge: Family;Available 24 hours/day Type of Home: House Home Access: Ramped entrance     Home Layout: One level     Bathroom Shower/Tub: Chief Strategy Officer: Standard Bathroom Accessibility: No   Home Equipment: Environmental consultant - 2 wheels;Bedside commode;Tub bench;Hospital bed;Wheelchair - IT trainer;Shower seat   Additional Comments: son reports having hoyer lift but prefer to transfer her personally      Prior Functioning/Environment Level of Independence: Needs assistance  Gait / Transfers Assistance Needed: total lift for bed - w/c transfers ADL's / Homemaking Assistance Needed: total A with all ADL, including feeding            OT Problem List: Decreased strength;Decreased activity tolerance;Impaired balance (sitting and/or standing);Impaired vision/perception;Decreased coordination;Decreased cognition;Decreased safety awareness;Decreased knowledge of use of DME or AE;Decreased knowledge of precautions;Impaired sensation;Impaired UE functional use;Pain      OT Treatment/Interventions: Self-care/ADL training;Other (comment);Therapeutic activities (positioning)    OT Goals(Current goals can be found in the care plan section) Acute Rehab OT Goals Patient Stated Goal: Home at d/c OT Goal Formulation: With patient/family Time For Goal Achievement: 08/29/16 Potential to Achieve Goals: Fair  OT Frequency: Min 1X/week   Barriers to D/C:            Co-evaluation              AM-PAC PT "6 Clicks" Daily Activity     Outcome  Measure Help from another person eating meals?: Total Help from another person taking care of personal grooming?: Total Help from another person toileting, which includes using toliet, bedpan, or urinal?: Total Help from another person bathing (including washing, rinsing, drying)?: Total Help from another person to put on and taking off regular upper body clothing?: Total Help from another person to put on and taking off regular lower body clothing?: Total 6 Click Score: 6   End of Session Nurse Communication: Need for lift equipment  Activity Tolerance: Patient limited by fatigue Patient left: in bed;with call bell/phone within reach;with family/visitor present  OT Visit Diagnosis: Muscle weakness (generalized) (M62.81);Low vision, both eyes (H54.2);Other symptoms and signs involving the nervous system (R29.898);Pain Pain - part of body: Leg                Time: 1410-1424 OT Time Calculation (min): 14 min Charges:  OT General Charges $OT Visit: 1 Procedure OT Evaluation $OT Eval Moderate Complexity: 1 Procedure G-Codes:     Arissa Fagin, OT/L  [phone] 08/15/2016  Helayna Dun,HILLARY 08/15/2016, 2:40 PM

## 2016-08-15 NOTE — Evaluation (Signed)
Physical Therapy Evaluation Patient Details Name: [name] MRN: [id number] DOB: 08/01/1923 Today's Date: 08/15/2016   History of Present Illness  [name] is a 83 y.o. female with past history significant for L BKA, anemia, heart failure, chronic kidney disease, coronary artery disease, diabetes, reflux, gout, hypertension, hypothyroidism presents with family with chief complaint of slurred speech and acute CVA.   Clinical Impression  Pt admitted with above diagnosis. Pt currently with functional limitations due to the deficits listed below (see PT Problem List). At the time of PT eval pt required mod-max assist for all aspects of functional mobility. Feel pt could benefit from SNF level therapies to decrease caregiver burden and return home as she wishes. During d/c planning discussion, pt not agreeable to SNF however did agree to discuss with family. Pt will benefit from skilled PT to increase their independence and safety with mobility to allow discharge to the venue listed below.       Follow Up Recommendations SNF;Supervision/Assistance - 24 hour    Equipment Recommendations  None recommended by PT    Recommendations for Other Services       Precautions / Restrictions Precautions Precautions: Fall Precaution Comments: L BKA Restrictions Weight Bearing Restrictions: No      Mobility  Bed Mobility Overal bed mobility: Needs Assistance Bed Mobility: Rolling;Sidelying to Sit;Sit to Sidelying Rolling: Mod assist Sidelying to sit: Max assist     Sit to sidelying: Mod assist General bed mobility comments: Pt required hand-over-hand assistance to reach for railings for assist to roll. Physical assist required for all aspects of bed mobility. Pt needed to use bed pan at end of session, and pt rolled L and R for bedpan placement and required total A for peri-care after.   Transfers                 General transfer comment: Not safe to attempt OOB transfer at  this time. Will require +2 assist or use of mechanical lift.  Ambulation/Gait                Stairs            Wheelchair Mobility    Modified Rankin (Stroke Patients Only) Modified Rankin (Stroke Patients Only) Pre-Morbid Rankin Score: Severe disability Modified Rankin: Severe disability     Balance Overall balance assessment: Needs assistance Sitting-balance support: Feet supported;Bilateral upper extremity supported Sitting balance-Leahy Scale: Poor Sitting balance - Comments: Able to hold herself up at EOB with BUE support but not able to maintain.  Postural control: Posterior lean;Left lateral lean                                   Pertinent Vitals/Pain Pain Assessment: No/denies pain    Home Living Family/patient expects to be discharged to:: Private residence Living Arrangements: Children Available Help at Discharge: Family;Available 24 hours/day Type of Home: House Home Access: Ramped entrance     Home Layout: One level Home Equipment: Walker - 2 wheels;Bedside commode;Tub bench;Hospital bed;Wheelchair - IT trainer;Shower seat      Prior Function Level of Independence: Needs assistance   Gait / Transfers Assistance Needed: Pt reports she uses the walker all the time but son assist her as well.   ADL's / Homemaking Assistance Needed: Son assists with bathing and dressing (total assist). Pt is able to feed herself         Hand Dominance  Dominant Hand: Right    Extremity/Trunk Assessment   Upper Extremity Assessment Upper Extremity Assessment: Defer to OT evaluation    Lower Extremity Assessment Lower Extremity Assessment: Generalized weakness;LLE deficits/detail LLE Deficits / Details: Noted knee flexion contracture ~30    Cervical / Trunk Assessment Cervical / Trunk Assessment: Kyphotic  Communication   Communication: HOH  Cognition Arousal/Alertness: Awake/alert Behavior During Therapy: WFL for tasks  assessed/performed Overall Cognitive Status: Impaired/Different from baseline Area of Impairment: Orientation;Memory;Following commands;Safety/judgement;Awareness;Problem solving                 Orientation Level: Disoriented to;Person;Time;Place   Memory: Decreased short-term memory Following Commands: Follows one step commands consistently;Follows one step commands with increased time;Follows multi-step commands inconsistently Safety/Judgement: Decreased awareness of safety;Decreased awareness of deficits Awareness: Intellectual Problem Solving: Slow processing;Difficulty sequencing;Requires verbal cues;Requires tactile cues General Comments: Pt unable to recall DOB. Short term memory decreased - when discussing d/c plan at end of session pt asking how therapist knew she needed assistance getting to EOB. With further conversation feel pt was trying to hide the fact that she did not remember mobilizing with therapist ~10 minutes before.      General Comments      Exercises     Assessment/Plan    PT Assessment Patient needs continued PT services  PT Problem List Decreased strength;Decreased range of motion;Decreased activity tolerance;Decreased balance;Decreased mobility;Decreased knowledge of use of DME;Decreased safety awareness;Decreased knowledge of precautions;Decreased cognition       PT Treatment Interventions DME instruction;Functional mobility training;Therapeutic activities;Therapeutic exercise;Neuromuscular re-education;Patient/family education;Cognitive remediation;Wheelchair mobility training    PT Goals (Current goals can be found in the Care Plan section)  Acute Rehab PT Goals Patient Stated Goal: Home at d/c - was not agreeable to SNF but did agree to discuss it with family.  PT Goal Formulation: With patient Time For Goal Achievement: 08/29/16 Potential to Achieve Goals: Fair    Frequency Min 2X/week   Barriers to discharge   Pt has 24 hour assist but  will require more assistance than she was PTA which may be too much on caregiver.     Co-evaluation               AM-PAC PT "6 Clicks" Daily Activity  Outcome Measure Difficulty turning over in bed (including adjusting bedclothes, sheets and blankets)?: Total Difficulty moving from lying on back to sitting on the side of the bed? : Total Difficulty sitting down on and standing up from a chair with arms (e.g., wheelchair, bedside commode, etc,.)?: Total Help needed moving to and from a bed to chair (including a wheelchair)?: Total Help needed walking in hospital room?: Total Help needed climbing 3-5 steps with a railing? : Total 6 Click Score: 6    End of Session   Activity Tolerance: Patient limited by fatigue Patient left: in bed;with bed alarm set;with call bell/phone within reach Nurse Communication: Mobility status PT Visit Diagnosis: Muscle weakness (generalized) (M62.81);Other symptoms and signs involving the nervous system (R29.898)    Time: 1610-9604 PT Time Calculation (min) (ACUTE ONLY): 26 min   Charges:   PT Evaluation $PT Eval Moderate Complexity: 1 Procedure PT Treatments $Gait Training: 8-22 mins   PT G Codes:        Conni Slipper, PT, DPT Acute Rehabilitation Services Pager: [phone]   Marylynn Pearson 08/15/2016, 12:39 PM

## 2016-08-15 NOTE — Progress Notes (Signed)
  Speech Language Pathology Treatment: Dysphagia  Patient Details Name: [name] MRN: [id number] DOB: 1923-04-23 Today's Date: 08/15/2016 Time: 1200-1210 SLP Time Calculation (min) (ACUTE ONLY): 10 min  Assessment / Plan / Recommendation Clinical Impression  Pt seen for check of diet tolerance following upgrade to thin liquids. Pts diet had not been upgraded and nectar thick liquids and pureed solids were untouched on her breakfast tray. Pt stated she had not been hungry. SLP offered water, which pt accepted and consumed 6 oz with no signs of aspiration and no oral holding. The pt was able to masticate one bite of a graham cracker with no difficulty, but refused more because, "It was sweet." Pt refused any other options as well.  Given pt demonstration of ability to [facility] as well as refusal to eat any of the food offered, will liberalize her diet to dys 3 (mech soft) and thin liquids with the hope of increasing her intake. Will f/u for tolerance.    HPI HPI: [name] Enochis a 83 y.o.femalewith past history significant for PVD, memory loss, GERD, anemia, appears heart failure, chronic kidney disease, coronary artery disease, diabetes, gout, hypertension, hypothyroidism presented with slurred speech, right facial droop. Pt hx of left MCA/PCA CVA, right inattention, dysphagia, aphasia. MRI 08/12/16 showed small left acute parietal lobe infarcts, punctate acute right frontal lobe infarct, chronic bilateral scratch sec infarcts in both parietal lobes, left thalamus and right cerebellum. Pt seen by SLP during prior admissions for both swallowing and cognition. Most recent BSE 12/29/15 noted severe aspiration risk, h/o esophageal deficits including dysmotility and diverticulum. Pt advanced to dys 1 (puree) and thin liquids prior to d/c.      SLP Plan  Continue with current plan of care       Recommendations  Diet recommendations: Dysphagia 3 (mechanical soft);Thin liquid Liquids provided  via: Straw;Cup Medication Administration: Crushed with puree Supervision: Staff to assist with self feeding Compensations: Slow rate;Small sips/bites;Minimize environmental distractions Postural Changes and/or Swallow Maneuvers: Seated upright 90 degrees                Oral Care Recommendations: Oral care BID Follow up Recommendations: 24 hour supervision/assistance SLP Visit Diagnosis: Dysphagia, unspecified (R13.10) Plan: Continue with current plan of care       GO               [facility], [place] CCC-SLP [phone]  Claudine Mouton 08/15/2016, 1:27 PM

## 2016-08-16 ENCOUNTER — Encounter (HOSPITAL_COMMUNITY): Payer: Medicare Other

## 2016-08-16 MED ORDER — FUROSEMIDE 40 MG PO TABS: 40.0000 mg | ORAL_TABLET | Freq: Every day | ORAL | Status: DC

## 2016-08-16 MED ORDER — ACETAMINOPHEN 325 MG PO TABS: 650.0000 mg | ORAL_TABLET | Freq: Four times a day (QID) | ORAL | Status: DC | PRN

## 2016-08-16 MED ORDER — HYDRALAZINE HCL 50 MG PO TABS: 50.0000 mg | ORAL_TABLET | Freq: Three times a day (TID) | ORAL | Status: DC

## 2016-08-16 MED ORDER — ASPIRIN 325 MG PO TABS: 325.0000 mg | ORAL_TABLET | Freq: Every day | ORAL | Status: DC

## 2016-08-16 MED ORDER — COLCHICINE 0.6 MG PO TABS: 0.6000 mg | ORAL_TABLET | Freq: Every day | ORAL | 0 refills | Status: DC

## 2016-08-16 NOTE — Discharge Instructions (Signed)
Seek Medical Care or Return if symptoms recur, worsen or new problems develop.  Take a coated-aspirin daily.

## 2016-08-16 NOTE — Progress Notes (Signed)
Responded to [facility] Consult to assist family with understanding AD.  Spoke with family and patients doctor. Pt is alert and communicating well.  I provide pt. daughter with copy to review and I gave instruction on how to proceed.   Provided guidance to family and pt. regarding family dynamics. Patient is being discharged today.

## 2016-08-16 NOTE — Clinical Social Work Note (Signed)
CSW received update from [facility] stating pt and family decided pt will discharge home. RNCM states and son at bedside. RNCM following for discharge needs. CSW signing off as no further social work needs identified. Please reconsult if new social work needs arsen.   Corlis Hove, LCSWA, LCASA  Clinical Social Work  [phone]

## 2016-08-16 NOTE — [place] FL2 (Signed)
[place] MEDICAID FL2 LEVEL OF CARE SCREENING TOOL     IDENTIFICATION  Patient Name: [name] Birthdate: Aug 02, 1923 Sex: female Admission Date (Current Location): 08/12/2016  [facility] and [place] Number:  Producer, television/film/video and Address:  [place], [address] [place], [place] [address]      Provider Number: [id number]  Attending Physician Name and Address:  Cleora Fleet, MD  Relative Name and Phone Number:       Current Level of Care: Hospital Recommended Level of Care: Skilled Nursing Facility Prior Approval Number:    Date Approved/Denied:   PASRR Number: [id number]  Discharge Plan: SNF    Current Diagnoses: Patient Active Problem List   Diagnosis Date Noted  . Acute CVA (cerebrovascular accident) (HCC) 08/13/2016  . Slurred speech 08/12/2016  . Acute gout due to renal impairment involving right shoulder 06/07/2016  . S/P AKA (above knee amputation) unilateral, left (HCC)   . CHF (congestive heart failure) (HCC) 04/18/2016  . Respiratory distress   . Acute on chronic systolic congestive heart failure (HCC) 01/16/2016  . Palliative care encounter   . Altered mental status   . Goals of care, counseling/discussion   . Palliative care by specialist   . Aphasia following other cerebrovascular disease   . Hemi-neglect of right side   . Cerebral thrombosis with cerebral infarction 12/30/2015  . Cerebrovascular accident (CVA) involving left middle cerebral artery territory [facility])   . Altered mental state 12/29/2015  . Acute right-sided weakness 12/29/2015  . Dysphagia   . FTT (failure to thrive) in adult 11/08/2015  . Nausea & vomiting 11/08/2015  . Metabolic acidosis 11/08/2015  . End stage renal disease (HCC) 10/07/2015  . Hypertensive heart disease with heart failure (HCC) 10/07/2015  . Junctional bradycardia 09/18/2015  . Atypical chest pain 06/23/2014  . Neck pain   . Food impaction of esophagus   . Epiglottitis  04/13/2014  . Wound disruption, post-op, skin 02/05/2014  . Pre-operative cardiovascular examination 12/13/2013  . Foot pain 09/04/2013  . CKD (chronic kidney disease) stage 4, GFR 15-29 ml/min (HCC) 09/04/2013  . Essential hypertension 08/23/2013  . Malnutrition of moderate degree (HCC) 08/21/2013  . Chronic diastolic heart failure (HCC) 08/15/2013  . Anemia of chronic disease 08/15/2013  . Hyperkalemia 08/15/2013  . 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 Dyslipidemia 01/05/2013  . Chronic renal insufficiency, stage IV (severe)- HD started 01/05/13 01/05/2013  . Diabetes mellitus type 2 with peripheral artery disease (HCC) 01/05/2013  . Peripheral vascular disease (HCC) 10/19/2011  . CAD - moderate at cath July 2012 (medical Rx) 02/08/2011  . 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 Severe hypothyroidism 02/08/2011    Orientation RESPIRATION BLADDER Height & Weight     Self  Normal Incontinent Weight: 112 lb (50.8 kg) Height:  4\' 9"  (144.8 cm)  BEHAVIORAL SYMPTOMS/MOOD NEUROLOGICAL BOWEL NUTRITION STATUS      Continent Diet (DIET DYS 3; Thin fluids)  AMBULATORY STATUS COMMUNICATION OF NEEDS Skin   Total Care Verbally Normal                       Personal Care Assistance Level of Assistance  Bathing, Feeding, Dressing Bathing Assistance: Maximum assistance Feeding assistance: Maximum assistance Dressing Assistance: Maximum assistance     Functional Limitations Info  Sight, Hearing, Speech Sight Info: Adequate Hearing Info: Adequate Speech Info: Adequate    SPECIAL CARE FACTORS  FREQUENCY  PT (By licensed PT), OT (By licensed OT)     PT Frequency: 5x OT Frequency: 5x            Contractures Contractures Info: Not present    Additional Factors Info  Code Status, Allergies Code Status Info: DNR Allergies Info: Aspirin           Current Medications (08/16/2016):  This is the current hospital  active medication list Current Facility-Administered Medications  Medication Dose Route Frequency Provider Last Rate Last Dose  . acetaminophen (TYLENOL) solution 650 mg  650 mg Per Tube Q4H PRN Haydee Salter, MD      . acetaminophen (TYLENOL) tablet 650 mg  650 mg Oral Q6H PRN Johnson, Clanford L, MD       Or  . acetaminophen (TYLENOL) suppository 650 mg  650 mg Rectal Q6H PRN Johnson, Clanford L, MD      . antiseptic oral rinse (BIOTENE) solution 15 mL  15 mL Topical PRN Johnson, Clanford L, MD      . aspirin suppository 300 mg  300 mg Rectal Daily Haydee Salter, MD   300 mg at 08/14/16 0930   Or  . aspirin tablet 325 mg  325 mg Oral Daily Haydee Salter, MD   325 mg at 08/16/16 0844  . atropine 1 % ophthalmic solution 4 drop  4 drop Sublingual Q4H PRN Johnson, Clanford L, MD      . heparin injection 5,000 Units  5,000 Units Subcutaneous Q8H Haydee Salter, MD   5,000 Units at 08/16/16 0555  . hydrALAZINE (APRESOLINE) injection 10 mg  10 mg Intravenous Q8H PRN Haydee Salter, MD   10 mg at 08/16/16 0150  . hydrALAZINE (APRESOLINE) tablet 50 mg  50 mg Oral Q8H Johnson, Clanford L, MD   50 mg at 08/15/16 2035  . ipratropium-albuterol (DUONEB) 0.5-2.5 (3) MG/3ML nebulizer solution 3 mL  3 mL Nebulization QHS PRN Haydee Salter, MD      . labetalol (NORMODYNE,TRANDATE) injection 10 mg  10 mg Intravenous Q2H PRN Opyd, Lavone Neri, MD   10 mg at 08/15/16 0532  . levothyroxine (SYNTHROID, LEVOTHROID) tablet 100 mcg  100 mcg Oral QAC breakfast Laural Benes, Clanford L, MD   100 mcg at 08/16/16 0844  . LORazepam (ATIVAN) tablet 0.5 mg  0.5 mg Oral Q4H PRN Cleora Fleet, MD       Or  . LORazepam (ATIVAN) 2 MG/ML concentrated solution 0.5 mg  0.5 mg Sublingual Q4H PRN Cleora Fleet, MD       Or  . LORazepam (ATIVAN) injection 0.5 mg  0.5 mg Intravenous Q4H PRN Johnson, Clanford L, MD      . metoprolol tartrate (LOPRESSOR) injection 5 mg  5 mg Intravenous Q12H Haydee Salter, MD   5  mg at 08/16/16 0844  . morphine 4 MG/ML injection 1 mg  1 mg Intravenous Q2H PRN Laural Benes, Clanford L, MD   1 mg at 08/15/16 0554  . ondansetron (ZOFRAN-ODT) disintegrating tablet 4 mg  4 mg Oral Q6H PRN Johnson, Clanford L, MD       Or  . ondansetron (ZOFRAN) injection 4 mg  4 mg Intravenous Q6H PRN Johnson, Clanford L, MD      . polyvinyl alcohol (LIQUIFILM TEARS) 1.4 % ophthalmic solution 1 drop  1 drop Both Eyes QID PRN Johnson, Clanford L, MD      . pravastatin (PRAVACHOL) tablet 40 mg  40 mg Oral QPM Johnson, Clanford  L, MD   40 mg at 08/15/16 1731  . RESOURCE THICKENUP CLEAR   Oral PRN Cleora Fleet, MD         Discharge Medications: Please see discharge summary for a list of discharge medications.  Relevant Imaging Results:  Relevant Lab Results:   Additional Information SSN: [id number]; Palliative to follow in SNF  Dominic Pea, LCSW

## 2016-08-16 NOTE — Care Management Note (Signed)
Case Management Note  Patient Details  Name: [name] MRN: [id number] Date of Birth: 1923/11/07  Subjective/Objective:                    Action/Plan: Family has decided for patient to d/c home with [facility] services. CM met with the patient, her son and daughter and provided a list of [facility] agencies. They selected [facility]. Santiago Glad with [facility] notified and accepted the referral.  Pt with orders for wheelchair. Family asking to pick up wheelchair from the store at a later time. CM provided them the orders for the wheelchair.  Family asking for PTAR home. CM called and arranged transport. Transport form on the front of the chart with the DNR form. Bedside RN updated.   Expected Discharge Date:  08/16/16               Expected Discharge Plan:  [place]  In-House Referral:     Discharge planning Services  CM Consult  Post Acute Care Choice:  Home Health, Durable Medical Equipment Choice offered to:  Patient, Adult Children  DME Arranged:  Wheelchair manual (family given orders and will pick up at later time from store) DME Agency:     [place]:  RN, PT, OT, Nurse's Aide, Speech Therapy, Social Work [employer] Agency:  [facility]  Status of Service:  Completed, signed off  If discussed at [employer], dates discussed:    Additional Comments:  Pollie Friar, RN 08/16/2016, 3:13 PM

## 2016-08-16 NOTE — Discharge Summary (Addendum)
Physician Discharge Summary  [name] ZOX:[id number] DOB: [birth year] DOA: 08/12/2016  PCP: Georgianne Fick, MD  Admit date: 08/12/2016 Discharge date: 08/16/2016  Admitted From: Home  Disposition: SNF / Pt declined and decided to go home instead. Home with [facility] PT/OT/RN/Aide/SLP Equipment on discharge: ordered DME lightweight wheelchair with seat cushion  Recommendations for Outpatient Follow-up:  1. Please follow up with PCP in 1-2 weeks   Discharge Condition: Stable  CODE STATUS: DNR  Diet recommendation: Dysphagia 3, Thin liquid, Medications: Crushed with puree, Staff to assist with self feeding, Seated upright 90 degrees  Brief/Interim Summary: HPI: [name] is a 83 y.o. female  with past history significant for anemia, appears heart failure, chronic kidney disease, coronary artery disease, diabetes, reflux, gout, hypertension, hypothyroidism presents with family with chief complaint of slurred speech. Son gives history primarily as patient has memory impairment. Her son patient has not been ill over the last few days. No sick contacts. No head injuries. Woke up this morning with slurred speech and some right-sided facial droop. This is gradually improved over the course of the day. Patient was brought to the emergency room by her son due to concern for possible stroke.  Patient denies and some confirms no fevers, chills, nausea, vomiting, dysuria, urinary frequency, rash or headache.  Hospital course: [name] a 83 y.o.femalewith past history significant for anemia, appears heart failure, chronic kidney disease, coronary artery disease, diabetes, reflux, gout, hypertension, hypothyroidism presents with family with chief complaint of slurred speech and acute CVA.   Assessment & Plan:   Small acute left parietal lobe infarcts. Punctate acute right frontal lobe infarct. Pt continues to have Slurred speech and has been assessed by SLP and kept NPO for  HIGH aspiration risk.  Family decided to pursue comfort care with the understanding that patient could likely aspirate but they wanted to focus more on quality of life and understand that patient is at end of life. Pt wanting to eat and drink orally and has been tolerating Dys 3 with assistance.  Discussed with family at bedside.  They favor full comfort care and decided to have patient go to SNF with hospice service and support.    Continue aspirin 325 mg.   Mild dehydration Treated with Gentle IV fluids, now drinking and eating  Hypertension Permissive hypertension for 3 days Resumed home blood pressure meds, see dc meds.  Diabetes Mellitus, type 2 stable  CHF cardiomyopathy with EF 20%.   Lasix daily Resume home beta blocker Reviewed Echo from 1/18.   Chronic pain Gabapentin   CAD Stable  Hypothyroidism Resume levothyroxine  CKD IV Resume home renvela, sodium bicarb  GERD Pepcid daily  Constipation Colace as needed  Hyperlipidemia Resume home pravastatin  Gout Colchicine daily  Code Status:DNR DVT Prophylaxis: heparin Family Communication:son at bedside Disposition Plan:SNF was declined by patient and wanted to discharge home, son agrees to care for her 24/7 at home, home health services arranged for patient PT/OT/Aide/SW/RN/SLP  Discharge Diagnoses:  Principal Problem:   Acute CVA (cerebrovascular accident) [facility]) Active Problems:   CAD - moderate at cath July 2012 (medical Rx)   Peripheral vascular disease (HCC)   Dyslipidemia   Chronic renal insufficiency, stage IV (severe)- HD started 01/05/13   Diabetes mellitus type 2 with peripheral artery disease (HCC)   Essential hypertension   Slurred speech  Discharge Instructions  Discharge Instructions    Increase activity slowly    Complete by:  As directed  Allergies as of 08/16/2016      Reactions   Aspirin Nausea And Vomiting, Other (See Comments)   325 mg (adult strength)  [name]       Medication List    STOP taking these medications   allopurinol 100 MG tablet Commonly known as:  ZYLOPRIM   calcitRIOL 0.25 MCG capsule Commonly known as:  ROCALTROL   gabapentin 300 MG capsule Commonly known as:  NEURONTIN   metoCLOPramide 5 MG tablet Commonly known as:  REGLAN   VITAMIN D PO     TAKE these medications   acetaminophen 325 MG tablet Commonly known as:  TYLENOL Take 650 mg by mouth every 6 (six) hours as needed for mild pain. What changed:  Another medication with the same name was added. Make sure you understand how and when to take each.   acetaminophen 325 MG tablet Commonly known as:  TYLENOL Take 2 tablets (650 mg total) by mouth every 6 (six) hours as needed for mild pain (or Fever >/= 101). What changed:  You were already taking a medication with the same name, and this prescription was added. Make sure you understand how and when to take each.   albuterol-ipratropium 18-103 MCG/ACT inhaler Commonly known as:  COMBIVENT Inhale 1-2 puffs into the lungs at bedtime as needed for wheezing or shortness of breath.   aspirin 325 MG tablet Take 1 tablet (325 mg total) by mouth daily. Start taking on:  08/17/2016 What changed:  Another medication with the same name was removed. Continue taking this medication, and follow the directions you see here.   calcium-vitamin D 500-200 MG-UNIT tablet Take 1 tablet by mouth daily.   colchicine 0.6 MG tablet Take 1 tablet (0.6 mg total) by mouth daily. Take two tablets by mouth 06/07/16 and then take one tablet daily until symptoms resolve. What changed:  how much to take  how to take this  when to take this   docusate sodium 100 MG capsule Commonly known as:  COLACE Take 100 mg by mouth daily as needed for mild constipation.   famotidine 20 MG tablet Commonly known as:  PEPCID Take 1 tablet (20 mg total) by mouth at bedtime. What  changed:  when to take this  reasons to take this   feeding supplement Liqd Take 1 Container by mouth 3 (three) times daily between meals.   furosemide 40 MG tablet Commonly known as:  LASIX Take 1 tablet (40 mg total) by mouth daily. What changed:  medication strength  how much to take  when to take this   hydrALAZINE 50 MG tablet Commonly known as:  APRESOLINE Take 1 tablet (50 mg total) by mouth 3 (three) times daily.   isosorbide dinitrate 20 MG tablet Commonly known as:  ISORDIL Take 1 tablet (20 mg total) by mouth 3 (three) times daily.   levothyroxine 100 MCG tablet Commonly known as:  SYNTHROID, LEVOTHROID Take 1 tablet (100 mcg total) by mouth daily.   metoprolol tartrate 25 MG tablet Commonly known as:  LOPRESSOR Take 0.5 tablets (12.5 mg total) by mouth 2 (two) times daily.   potassium chloride 20 MEQ/15ML (10%) Soln Take 15 mLs (20 mEq total) by mouth daily.   pravastatin 40 MG tablet Commonly known as:  PRAVACHOL Take 40 mg by mouth every evening.   sevelamer carbonate 0.8 g Pack packet Commonly known as:  RENVELA Take 0.8 g by mouth 3 (three) times  daily with meals.   sodium bicarbonate 650 MG tablet Take 1 tablet (650 mg total) by mouth daily.            Durable Medical Equipment        Start     Ordered   08/16/16 1458  For home use only DME lightweight manual wheelchair with seat cushion  Once    Comments:  Patient suffers from acute CVA which impairs their ability to perform daily activities like bathing, dressing, feeding and grooming in the home.  A cane, crutch or walker will not resolve  issue with performing activities of daily living. A wheelchair will allow patient to safely perform daily activities. Patient is not able to propel themselves in the home using a standard weight wheelchair due to arm weakness and general weakness. Patient can self propel in the lightweight wheelchair.  Accessories: elevating leg rests (ELRs), wheel  locks, extensions and anti-tippers.   08/16/16 1458     Follow-up Information    Georgianne Fick, MD. Schedule an appointment as soon as possible for a visit in 2 week(s).   Specialty:  Internal Medicine Contact information: [address] [place] [address] [place] [address] [phone]          Allergies  Allergen Reactions  . Aspirin Nausea And Vomiting and Other (See Comments)    325 mg (adult strength) [name]    Procedures/Studies: Dg Chest 1 View  Result Date: 08/12/2016 CLINICAL DATA:  83 year old female with cough and weakness. Confusion. EXAM: CHEST 1 VIEW COMPARISON:  02/16/2017 and earlier. FINDINGS: AP view at 1145 hours. Lung volumes are within normal limits. Resolved bilateral veiling pulmonary opacity since January. Mild cardiomegaly. Calcified aortic atherosclerosis is less apparent. Other mediastinal contours are within normal limits. No pneumothorax, pulmonary edema, pleural effusion or confluent pulmonary opacity. Negative visible bowel gas pattern. Surgical clips along the medial humeri, more numerous on the left. IMPRESSION: No acute cardiopulmonary abnormality. Electronically Signed   By: Odessa Fleming M.D.   On: 08/12/2016 12:01   Ct Head Wo Contrast  Result Date: 08/12/2016 CLINICAL DATA:  Left facial droop and slurred speech EXAM: CT HEAD WITHOUT CONTRAST TECHNIQUE: Contiguous axial images were obtained from the base of the skull through the vertex without intravenous contrast. COMPARISON:  CT head 01/16/2016 FINDINGS: Brain: Moderate to advanced atrophy is stable. Chronic microvascular ischemic change in the white matter. Chronic infarct in the left thalamus. Chronic bilateral parietal infarcts unchanged. Negative for acute infarct, hemorrhage, or mass lesion. Vascular: Carotid and vertebral artery calcification. Negative for hyperdense vessel. Skull: Negative Sinuses/Orbits: Bilateral lens  replacement.  Paranasal sinuses clear Other: None IMPRESSION: Moderate to advanced atrophy. Moderate to advanced chronic ischemic change. No acute abnormality. Electronically Signed   By: Marlan Palau M.D.   On: 08/12/2016 11:26   Mr Brain Wo Contrast  Result Date: 08/12/2016 CLINICAL DATA:  Slurred speech. Possible left facial droop. Minor left grip weakness. EXAM: MRI HEAD WITHOUT CONTRAST MRA HEAD WITHOUT CONTRAST TECHNIQUE: Multiplanar, multiecho pulse sequences of the brain and surrounding structures were obtained without intravenous contrast. Angiographic images of the head were obtained using MRA technique without contrast. COMPARISON:  Head CT 08/12/2016. Brain MRI 12/29/2015. Head MRA 07/05/2004. FINDINGS: MRI HEAD FINDINGS Brain: There is a 4 mm acute cortical infarct in the anterior right frontal lobe. There are multiple small foci of acute infarction involving cortex and white matter in the left parietal lobe, predominantly  superior to the chronic infarct and with the largest confluent focus of infarction measuring slightly greater than 1 cm in size. There is a chronic right parietal infarct as well which is new from the prior MRI, with a small amount of mineralization and/or chronic blood products noted. Moderate ventriculomegaly is unchanged from the prior MRI and favored to be ex vacuo in nature due to cerebral atrophy and chronic infarcts, with normal pressure hydrocephalus considered less likely. Confluent periventricular white matter T2 hyperintensities have likely slightly progressed from the prior MRI and are nonspecific but compatible with moderately extensive chronic small vessel ischemia. There is no evidence of acute intracranial hemorrhage, mass, midline shift, or extra-axial fluid collection. Small chronic infarcts are again seen in the left thalamus and right cerebellum. Vascular: Major intracranial vascular flow voids are preserved. Skull and upper cervical spine: Unremarkable bone  marrow signal. Sinuses/Orbits: Prior left cataract extraction. Trace left mastoid effusion. No significant paranasal sinus disease. Other: None. MRA HEAD FINDINGS The study is mildly motion degraded. The visualized distal vertebral arteries are patent to the basilar with the right being moderately dominant. There is mild irregular narrowing of the distal left V4 segment. The basilar artery is patent with mild diffuse irregularity which may reflect a combination of atherosclerosis and motion artifact but without evidence of flow limiting stenosis. SCAs are patent though there may be a significant stenosis at the left SCA origin. PCAs are patent with possible moderate right greater than left origin stenoses, although motion through this level may exaggerate the appearance. More distally, there is diffuse PCA irregularity bilaterally with mild-to-moderate mid to distal right P2 narrowing and severe tandem P3 stenoses bilaterally. Posterior communicating arteries are not identified. The internal carotid artery's are patent from skullbase to carotid termini with moderate anterior right cavernous and mild bilateral supraclinoid ICA stenoses. The right A1 segment is widely patent, although there is a mild-to-moderate focal mid right A2 stenosis. There is diminished flow related enhancement in the left ACA with a suspected severe stenosis at its origin. There is either a severe stenosis or short segment occlusion of the mid left A2 segment with reconstituted but diminished flow more distally. There is an early bifurcation of the right MCA versus prominent anterior temporal artery variant with the severe tandem distal M1/proximal M2 stenoses just distal to this. The left MCA is patent with mild distal M1 stenosis and with up to moderate narrowing of M2 branch vessels. No intracranial aneurysm is identified. IMPRESSION: 1. Small acute left parietal lobe infarcts. 2. Punctate acute right frontal lobe infarct. 3. Chronic  bilateral scratch sec chronic infarcts in both parietal lobes, left thalamus, and right cerebellum. 4. Moderately extensive chronic small vessel ischemic disease. 5. Advanced intracranial atherosclerosis with numerous anterior and posterior circulation stenoses as above. High-grade left ACA origin stenosis with diminished left ACA flow and additional high-grade stenosis versus short segment occlusion in the A2 segment. Electronically Signed   By: Sebastian Ache M.D.   On: 08/12/2016 17:19   Mr Maxine Glenn Head/brain ZO Cm  Result Date: 08/12/2016 CLINICAL DATA:  Slurred speech. Possible left facial droop. Minor left grip weakness. EXAM: MRI HEAD WITHOUT CONTRAST MRA HEAD WITHOUT CONTRAST TECHNIQUE: Multiplanar, multiecho pulse sequences of the brain and surrounding structures were obtained without intravenous contrast. Angiographic images of the head were obtained using MRA technique without contrast. COMPARISON:  Head CT 08/12/2016. Brain MRI 12/29/2015. Head MRA 07/05/2004. FINDINGS: MRI HEAD FINDINGS Brain: There is a 4 mm acute cortical infarct in the  anterior right frontal lobe. There are multiple small foci of acute infarction involving cortex and white matter in the left parietal lobe, predominantly superior to the chronic infarct and with the largest confluent focus of infarction measuring slightly greater than 1 cm in size. There is a chronic right parietal infarct as well which is new from the prior MRI, with a small amount of mineralization and/or chronic blood products noted. Moderate ventriculomegaly is unchanged from the prior MRI and favored to be ex vacuo in nature due to cerebral atrophy and chronic infarcts, with normal pressure hydrocephalus considered less likely. Confluent periventricular white matter T2 hyperintensities have likely slightly progressed from the prior MRI and are nonspecific but compatible with moderately extensive chronic small vessel ischemia. There is no evidence of acute  intracranial hemorrhage, mass, midline shift, or extra-axial fluid collection. Small chronic infarcts are again seen in the left thalamus and right cerebellum. Vascular: Major intracranial vascular flow voids are preserved. Skull and upper cervical spine: Unremarkable bone marrow signal. Sinuses/Orbits: Prior left cataract extraction. Trace left mastoid effusion. No significant paranasal sinus disease. Other: None. MRA HEAD FINDINGS The study is mildly motion degraded. The visualized distal vertebral arteries are patent to the basilar with the right being moderately dominant. There is mild irregular narrowing of the distal left V4 segment. The basilar artery is patent with mild diffuse irregularity which may reflect a combination of atherosclerosis and motion artifact but without evidence of flow limiting stenosis. SCAs are patent though there may be a significant stenosis at the left SCA origin. PCAs are patent with possible moderate right greater than left origin stenoses, although motion through this level may exaggerate the appearance. More distally, there is diffuse PCA irregularity bilaterally with mild-to-moderate mid to distal right P2 narrowing and severe tandem P3 stenoses bilaterally. Posterior communicating arteries are not identified. The internal carotid artery's are patent from skullbase to carotid termini with moderate anterior right cavernous and mild bilateral supraclinoid ICA stenoses. The right A1 segment is widely patent, although there is a mild-to-moderate focal mid right A2 stenosis. There is diminished flow related enhancement in the left ACA with a suspected severe stenosis at its origin. There is either a severe stenosis or short segment occlusion of the mid left A2 segment with reconstituted but diminished flow more distally. There is an early bifurcation of the right MCA versus prominent anterior temporal artery variant with the severe tandem distal M1/proximal M2 stenoses just distal to  this. The left MCA is patent with mild distal M1 stenosis and with up to moderate narrowing of M2 branch vessels. No intracranial aneurysm is identified. IMPRESSION: 1. Small acute left parietal lobe infarcts. 2. Punctate acute right frontal lobe infarct. 3. Chronic bilateral scratch sec chronic infarcts in both parietal lobes, left thalamus, and right cerebellum. 4. Moderately extensive chronic small vessel ischemic disease. 5. Advanced intracranial atherosclerosis with numerous anterior and posterior circulation stenoses as above. High-grade left ACA origin stenosis with diminished left ACA flow and additional high-grade stenosis versus short segment occlusion in the A2 segment. Electronically Signed   By: Sebastian Ache M.D.   On: 08/12/2016 17:19   (Echo, Carotid, EGD, Colonoscopy, ERCP)   Subjective: Pt sitting up in chair, says she wants to get out of here.   Discharge Exam: Vitals:   08/16/16 0956 08/16/16 1423  BP: (!) 166/55 (!) 160/59  Pulse: (!) 56 61  Resp: 19 20  Temp: 97.7 F (36.5 C) 98.1 F (36.7 C)   Vitals:  08/16/16 0241 08/16/16 0400 08/16/16 0956 08/16/16 1423  BP: (!) 185/52 (!) 139/55 (!) 166/55 (!) 160/59  Pulse:  72 (!) 56 61  Resp:  18 19 20   Temp:  98.2 F (36.8 C) 97.7 F (36.5 C) 98.1 F (36.7 C)  TempSrc:  Axillary Oral Oral  SpO2:  99% 100% 100%  Weight:      Height:       General: Pt is alert, awake, not in acute distress Cardiovascular: RRR, S1/S2 +, no rubs, no gallops Respiratory: CTA bilaterally, no wheezing, no rhonchi Abdominal: Soft, NT, ND, bowel sounds + Extremities: no edema, no cyanosis Neurological: Pt sitting up, awake, oriented x 2.   The results of significant diagnostics from this hospitalization (including imaging, microbiology, ancillary and laboratory) are listed below for reference.     Microbiology: No results found for this or any previous visit (from the past 240 hour(s)).   Labs: BNP (last 3 results)  Recent Labs   01/16/16 1151 04/18/16 1620  BNP 3,168.6* 2,284.3*   Basic Metabolic Panel:  Recent Labs Lab 08/12/16 1057 08/12/16 1110 08/12/16 1802  NA 141 142  --   K 4.1 4.1  --   CL 109 110  --   CO2 23  --   --   GLUCOSE 162* 157*  --   BUN 46* 49*  --   CREATININE 2.89* 3.00*  --   CALCIUM 8.9  --   --   MG  --   --  1.8  PHOS  --   --  4.1   Liver Function Tests:  Recent Labs Lab 08/12/16 1057  AST 29  ALT 20  ALKPHOS 102  BILITOT 0.6  PROT 6.5  ALBUMIN 3.1*   No results for input(s): LIPASE, AMYLASE in the last 168 hours. No results for input(s): AMMONIA in the last 168 hours. CBC:  Recent Labs Lab 08/12/16 1057 08/12/16 1110  WBC 8.5  --   NEUTROABS 5.2  --   HGB 10.7* 11.2*  HCT 33.6* 33.0*  MCV 93.9  --   PLT 349  --    Cardiac Enzymes:  Recent Labs Lab 08/12/16 1802  TROPONINI 0.03*   BNP: Invalid input(s): POCBNP CBG:  Recent Labs Lab 08/12/16 1019  GLUCAP 145*   D-Dimer No results for input(s): DDIMER in the last 72 hours. Hgb A1c No results for input(s): HGBA1C in the last 72 hours. Lipid Profile No results for input(s): CHOL, HDL, LDLCALC, TRIG, CHOLHDL, LDLDIRECT in the last 72 hours. Thyroid function studies No results for input(s): TSH, T4TOTAL, T3FREE, THYROIDAB in the last 72 hours.  Invalid input(s): FREET3 Anemia work up No results for input(s): VITAMINB12, FOLATE, FERRITIN, TIBC, IRON, RETICCTPCT in the last 72 hours. Urinalysis    Component Value Date/Time   COLORURINE STRAW (A) 08/12/2016 1542   APPEARANCEUR CLEAR 08/12/2016 1542   LABSPEC 1.006 08/12/2016 1542   PHURINE 5.0 08/12/2016 1542   GLUCOSEU NEGATIVE 08/12/2016 1542   HGBUR NEGATIVE 08/12/2016 1542   BILIRUBINUR NEGATIVE 08/12/2016 1542   KETONESUR NEGATIVE 08/12/2016 1542   PROTEINUR NEGATIVE 08/12/2016 1542   UROBILINOGEN 0.2 08/18/2013 1807   NITRITE NEGATIVE 08/12/2016 1542   LEUKOCYTESUR NEGATIVE 08/12/2016 1542   Sepsis Labs Invalid input(s):  PROCALCITONIN,  WBC,  LACTICIDVEN Microbiology No results found for this or any previous visit (from the past 240 hour(s)).  Time coordinating discharge: 32 minutes  SIGNED:  Standley Dakins, MD  [facility] 08/16/2016, 3:01 PM Pager [phone]  If 7PM-7AM, please contact night-coverage [email or web address] Password [email or web address]

## 2016-09-08 ENCOUNTER — Encounter: Payer: Self-pay | Admitting: Internal Medicine

## 2016-09-08 ENCOUNTER — Ambulatory Visit: Payer: Medicare Other | Admitting: Internal Medicine

## 2016-09-08 VITALS — BP 183/75 | HR 54 | Ht <= 58 in | Wt 112.0 lb

## 2016-09-08 DIAGNOSIS — N184 Chronic kidney disease, stage 4 (severe): Secondary | ICD-10-CM | POA: Diagnosis not present

## 2016-09-08 DIAGNOSIS — I739 Peripheral vascular disease, unspecified: Secondary | ICD-10-CM

## 2016-09-08 DIAGNOSIS — I5043 Acute on chronic combined systolic (congestive) and diastolic (congestive) heart failure: Secondary | ICD-10-CM | POA: Diagnosis not present

## 2016-09-08 DIAGNOSIS — I110 Hypertensive heart disease with heart failure: Secondary | ICD-10-CM | POA: Diagnosis not present

## 2016-09-08 NOTE — Patient Instructions (Signed)
Your physician wants you to follow-up in: 6 months with Dr. Hilty. You will receive a reminder letter in the mail two months in advance. If you don't receive a letter, please call our office to schedule the follow-up appointment.    

## 2016-09-08 NOTE — Progress Notes (Signed)
OFFICE NOTE  Chief Complaint:  No complaints  Primary Care Physician: Georgianne Fick, MD  HPI:  [name] is a pleasant 83 year old female with an unfortunate history of heart failure in the past due to severe hypothyroidism. EF has subsequently normalized. However, she does have significant peripheral arterial disease; underwent a left BKA. When I saw her last on December 13, she was having problems with rest pain in her right foot, and discoloration of the sole of her foot and toes, with tenderness to palpation. I had prescribed her for pain medicine and had reviewed her lower extremity arteriogram with Dr. Allyson Sabal, and felt that there were very few options for revascularization. She returned today and has reported some improvement with reduction in pain, and there does appear to be less discoloration. However, pulses still remain very weak. Her family reports she's also had a mild cough and does seem a little bit more fatigued. She did not report any worsening shortness of breath or orthopnea but has had some difficulty transferring. At her last office visit, her JVP is elevated and there was some trace right lower extremity edema. She did have basilar crackles and appeared to be volume overloaded. I recommended increasing her diuretics and ordered a BMP and BNP. She was to return to the office in 2 weeks for reassessment. Unfortunately she never made that appointment - ultimately I recheck some laboratory work and she was noted to have progressive renal failure. She was ultimately brought to the ED with complaints of worsening SOB and Wheezing. EMS was called to her home and found her O2 sats to be 70% and she was placed on supplemental O2, and had mild improvement. She was found to have findings of CHF, however, her EF on echo was 65-70%. She has acute on chronic renal failure - her fistula could not be accessed and a temporary dialysis catheter was placed. She was dialyzed while in the  hospital and was discharged to rehabilitation. She has not had any followup dialysis and is supposed to be getting weekly test of her renal function being sent to her nephrologist. I do not see where she has an appointment with her nephrologist.  [name] returns today after a number of recent hospitalizations. In October she underwent fistula placement this was complicated by swelling of the arm and it was felt the fistula had to be closed. She now has little if any vascular access. She was then hospitalized for epiglottitis and treated with antibiotics. She continues to be wheezy. All of her wheezes upper airway. Weight is fortunately been stable she's denied any chest pain or worsening heart failure symptoms.   I saw [name] back in the office today. Overall she is without any complaints. She continues to have very poor renal function with a GFR between 15 and 20. She has no dialysis access. She's not had any end-of-life discussions and I briefly spoke with her today about that. She may wish to consider her options, especially DO NOT RESUSCITATE if she has no dialysis access. Given her age and comorbidities this is a very realistic thing to think about.  09/18/2015  [name] returns today for follow-up. Overall she is doing fairly well. She did have a fistula but is not yet on dialysis. Her quality of life is fairly sedentary. An EKG was performed today due to bradycardia and she was noted to be in a junctional rhythm at 48. She is on carvedilol 6.25 mg twice a day due  to her cardiomyopathy. 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  10/07/2015  [name] returns today for follow-up. He reports that she was seen in the ER and was noted to be markedly hypertensive with abdominal discomfort, nausea and vomiting. No adjustments were made to her blood pressure medicines. Troponin was negative however EKG showed new  anterolateral T-wave inversions. In addition her QT interval was prolonged greater than 550 ms. I repeated her EKG today and shows a normal QTC of 453 ms however there are still mild anterolateral T-wave changes. She denies any chest pain. Her appetite has improved. Blood pressure still remains elevated today at 184/87. Heart rate has improved after discontinuing carvedilol as she was noted to be in a junctional rhythm in the 40s. Going forward I would avoid AV nodal blocking medications.  06/07/2016  [name] was seen today in follow-up from her hospitalization. Unfortunate she was quite ill and presented an acute systolic congestive heart failure. EF was found to be again reduced around 30%. She has not been compliant with her thyroid medication and her TSH in the hospital was 261. She has since been restarted on thyroid medicine. She was diuresed aggressively. She was made a DO NOT RESUSCITATE and I think that's appropriate given her age. She did not want to pursue palliative care. Today in follow-up she is doing fairly well. He seems that she is keeping her edema off. Her main complaint is right shoulder pain. Blood pressure was elevated today, possibly related to the pain.  09/08/2016  [name] was seen today in follow-up. Unfortunate she had a recurrent stroke about 3 weeks ago. She had some medication adjustments and is doing much better. She denies any chest pain or worsening shortness of breath. She denies any worsening extremity swelling in the right lower extremity. She is status post left BKA. Blood pressure is elevated today however her husband says that he is not given her her hydralazine at lunch.  PMHx:  Past Medical History:  Diagnosis Date  . Abnormal nuclear stress test 06/01/09   Demonstrated a new area of infarct scar, peri-infarct ischemia seen in the inferolateral territory. EF eas normal at 70% with mild hypocontractility at the apex, distal inferolateral wall.  . Anemia   .  Arthritis   . Cardiomyopathy, idiopathic (HCC) 02/08/2011  . Chronic diastolic CHF (congestive heart failure) (HCC)    Takes Lasix  . Chronic kidney disease (CKD), stage IV (severe) (HCC)   . Coronary artery disease    a. Cath 09/2010 - med rx.  . Diabetes mellitus    type 2 NIDDM  . Dyslipidemia   . GERD (gastroesophageal reflux disease)   . Gout    takes allopurinol  . H/O echocardiogram 09/06/11   Indication- nonIschemic Cardiomyopathy. EF = now greater than 55% with no regional wall motion abnormalities. Tthere is mild to moderate trisuspid regurgitayion and mild pulmonary hypertension with an RVSP of 35 mmHg as well as stage 1 diastolic dysfunction and mild to moderate LVH.  Marland Kitchen Hx of transient ischemic attack (TIA)   . Hypertension   . Hypothyroidism    (SEVERE) Takes Levothryroxine  . Irregular heartbeat   . Memory loss   . Myocardial infarct (HCC)    x 3 unsure of years  . Nonischemic cardiomyopathy (HCC)    EF now is 55%, reduced due to myxedema, which is improved.  . Peripheral neuropathy   .  Peripheral vascular disease (HCC)    a. s/p L BKA.  . Shingles   . Stroke (HCC)   . TIA (transient ischemic attack)   . Ulcer     Past Surgical History:  Procedure Laterality Date  . AMPUTATION  07/05/2011   Procedure: AMPUTATION BELOW KNEE;  Surgeon: Chuck Hint, MD;  Location: [facility] OR;  Service: Vascular;  Laterality: Left;  . ANGIOPLASTY  1988  . AV FISTULA PLACEMENT    . AV FISTULA PLACEMENT Right 12/24/2013   Procedure: INSERTION OF ARTERIOVENOUS (AV) GORE-TEX GRAFT ARM;  Surgeon: Chuck Hint, MD;  Location: [facility] OR;  Service: Vascular;  Laterality: Right;  . BACK SURGERY     [facility]  . CARDIAC CATHETERIZATION  09/28/07   Demonstrated multiple sequential lesions around 40 to 30% in the RCA territory.  . Duplex doppler  05/10/11   LE arterial dopplers demonstrate bilaterally reduced ABIs of 0.91 on right & 0.56 on left. She does report some  decreased pain on the left, & there's moderate mixed-density plaque in the right SFA w/50 to 69% reduction. There's a 69% reduction in the left SFA & does appear to be occlusive disease of left posterior tibial artery. Right posterior dorsalis pedis artery demonstrates occlusive disease  . ESOPHAGOGASTRODUODENOSCOPY N/A 04/15/2014   Procedure: ESOPHAGOGASTRODUODENOSCOPY (EGD);  Surgeon: Rachael Fee, MD;  Location: [facility] ENDOSCOPY;  Service: Endoscopy;  Laterality: N/A;  . EYE SURGERY     Left eye surgery; cataract removal  . INSERTION OF DIALYSIS CATHETER N/A 01/06/2013   Procedure: INSERTION OF DIALYSIS CATHETER;  Surgeon: Larina Earthly, MD;  Location: [facility] OR;  Service: Vascular;  Laterality: N/A;  . left bka  07/05/2011  . left lower extremity venous duplex Left 06/27/11   Summary: No evidence of DVT involving the left lower extremity and right common femoral vein.   Marland Kitchen LIGATION ARTERIOVENOUS GORTEX GRAFT Right 03/25/2014   Procedure: LIGATION ARTERIOVENOUS GORTEX GRAFT;  Surgeon: Chuck Hint, MD;  Location: [facility] OR;  Service: Vascular;  Laterality: Right;  . LOWER EXTREMITY ANGIOGRAM N/A 10/31/2011   Procedure: LOWER EXTREMITY ANGIOGRAM;  Surgeon: Chuck Hint, MD;  Location: [facility] CATH LAB;  Service: Cardiovascular;  Laterality: N/A;  . Lower extremity arterial evaluation  06/27/11   SUMMARY: Right: ABI not ascertained due to false elevation in BP secondary to calcification (posterior tibial artery is non compressible). Left: ABI indicates moderate reduction in arterial flow. Bilateral: Great toe PPG waveforms indicate adequate perfusion. Great toe pressures not obtained due to patient's movements secondary to pain.  Marland Kitchen SHUNTOGRAM N/A 03/17/2014   Procedure: Betsey Amen;  Surgeon: Fransisco Hertz, MD;  Location: [facility] CATH LAB;  Service: Cardiovascular;  Laterality: N/A;    FAMHx:  Family History  Problem Relation Age of Onset  . Heart attack Daughter   . Heart disease Daughter        Before  age 62  . Cancer Sister        STOMACH  . Diabetes Sister   . Cancer Brother        BONE  . Diabetes Brother   . Hyperlipidemia Daughter   . Hypertension Daughter   . Heart disease Daughter        before age 61  . Kidney disease Daughter   . Other Daughter        varicose veins  . Diabetes Daughter   . Heart disease Son        before age 52  .  Hyperlipidemia Son   . Hypertension Son   . Heart attack Son   . Anesthesia problems Neg Hx   . Hypotension Neg Hx   . Malignant hyperthermia Neg Hx   . Pseudochol deficiency Neg Hx     SOCHx:   reports that she quit smoking about 30 years ago. She has a 20.00 pack-year smoking history. She has quit using smokeless tobacco. She reports that she does not drink alcohol or use drugs.  ALLERGIES:  Allergies  Allergen Reactions  . Aspirin Nausea And Vomiting and Other (See Comments)    325 mg (adult strength) [name]     ROS: Pertinent items noted in HPI and remainder of comprehensive ROS otherwise negative.  HOME MEDS: Current Outpatient Prescriptions  Medication Sig Dispense Refill  . acetaminophen (TYLENOL) 325 MG tablet Take 650 mg by mouth every 6 (six) hours as needed for mild pain.    Marland Kitchen acetaminophen (TYLENOL) 325 MG tablet Take 2 tablets (650 mg total) by mouth every 6 (six) hours as needed for mild pain (or Fever >/= 101).    Marland Kitchen albuterol-ipratropium (COMBIVENT) 18-103 MCG/ACT inhaler Inhale 1-2 puffs into the lungs at bedtime as needed for wheezing or shortness of breath.     Marland Kitchen aspirin 325 MG tablet Take 1 tablet (325 mg total) by mouth daily.    . Calcium Carbonate-Vitamin D (CALCIUM-VITAMIN D) 500-200 MG-UNIT tablet Take 1 tablet by mouth daily.    . colchicine 0.6 MG tablet Take 1 tablet (0.6 mg total) by mouth daily. Take two tablets by mouth 06/07/16 and then take one tablet daily until symptoms resolve. 10 tablet 0  . docusate sodium (COLACE) 100 MG capsule  Take 100 mg by mouth daily as needed for mild constipation.    . famotidine (PEPCID) 20 MG tablet Take 1 tablet (20 mg total) by mouth at bedtime. (Patient taking differently: Take 20 mg by mouth at bedtime as needed for heartburn. ) 30 tablet 3  . feeding supplement (BOOST / RESOURCE BREEZE) LIQD Take 1 Container by mouth 3 (three) times daily between meals.  0  . furosemide (LASIX) 40 MG tablet Take 1 tablet (40 mg total) by mouth daily.    . hydrALAZINE (APRESOLINE) 50 MG tablet Take 1 tablet (50 mg total) by mouth 3 (three) times daily.    . isosorbide dinitrate (ISORDIL) 20 MG tablet Take 1 tablet (20 mg total) by mouth 3 (three) times daily. 90 tablet 0  . levothyroxine (SYNTHROID, LEVOTHROID) 100 MCG tablet Take 1 tablet (100 mcg total) by mouth daily. 30 tablet 0  . metoprolol tartrate (LOPRESSOR) 25 MG tablet Take 0.5 tablets (12.5 mg total) by mouth 2 (two) times daily. 60 tablet 0  . potassium chloride 20 MEQ/15ML (10%) SOLN Take 15 mLs (20 mEq total) by mouth daily. 200 mL 0  . pravastatin (PRAVACHOL) 40 MG tablet Take 40 mg by mouth every evening.     . sevelamer carbonate (RENVELA) 0.8 g PACK packet Take 0.8 g by mouth 3 (three) times daily with meals. 270 each 0  . sodium bicarbonate 650 MG tablet Take 1 tablet (650 mg total) by mouth daily. 30 tablet 0   No current facility-administered medications for this visit.     LABS/IMAGING: No results found for this or any previous visit (from the past 48 hour(s)). No results found.  VITALS: BP (!) 183/75   Pulse (!) 54   Ht 4\' 9"  (  1.448 m)   Wt 112 lb (50.8 kg)   SpO2 100%   BMI 24.24 kg/m   EXAM: General appearance: alert and no distress Neck: JVD flat, no carotid bruits, upper airway wheeze Lungs: diminished breath sounds bilaterally, faint basilar crackles Heart: regular rate and rhythm Abdomen: soft, mildly protuberant Extremities: no edema on the right leg, left is BKA Pulses: faint pulse in the right foot Skin: Skin  color, texture, turgor normal. No rashes or lesions Neurologic: Mental status: Alert, oriented, thought content appropriate Psych: Flat affect  EKG: Deferred  ASSESSMENT: 1. Acute right shoulder inflammatory arthritis-possibly gout 2. Symptomatic junctional bradycardia - HR improved off of b-blocker 3. Recent acute on chronic systolic heart failure 4. Acute on chronic kidney disease stage V-hemodialysis catheter in place 5. PAD with left BKA (wheelchair bound) 6. History of severe hypothyroidism with non-ischemic cardiomyopathy, EF improved to 65-70% 7. ESRD, not on HD - fistula in place 8. Recurrent TIA  PLAN: 1.   [name] seems to have improved recently with medical therapy and adjusting medications. She was seen in the hospital for possible acute stroke was found to have acute left parietal lobe infarcts. There is discussion about full comfort measures and having the patient go to skilled nursing facility with hospice support. She seems to be doing fairly well at this time. Plan to continue current medications.  Follow-up with me in 6 months.  Chrystie Nose, MD, [facility] Attending Cardiologist [facility]  Lisette Abu Faust [place] 09/08/2016, 2:30 PM

## 2016-09-12 IMAGING — RF DG ESOPHAGUS
14 of 16 series · 14 of 16 positions shown · non-contrast
Comparison: None.

CLINICAL DATA: Food getting stuck in the esophagus, nausea and
vomiting.

EXAM:
ESOPHOGRAM/BARIUM SWALLOW
TECHNIQUE: Single contrast examination was performed using  thin barium.
FLUOROSCOPY TIME:  Radiation Exposure Index (as provided by the
fluoroscopic device): 1 minutes 5 seconds.
If the device does not provide the exposure index:
Fluoroscopy Time:  dictate in minutes and seconds
Number of Acquired Images:  1

[Series 1: run · 1 of 1 slices shown (1 of 14)]
[im 1/1]
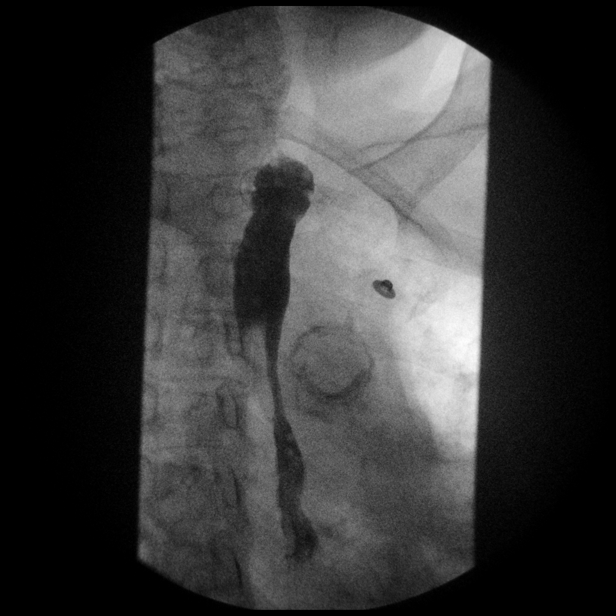

[Series 2: run · 1 of 1 slices shown (2 of 14)]
[im 1/1]
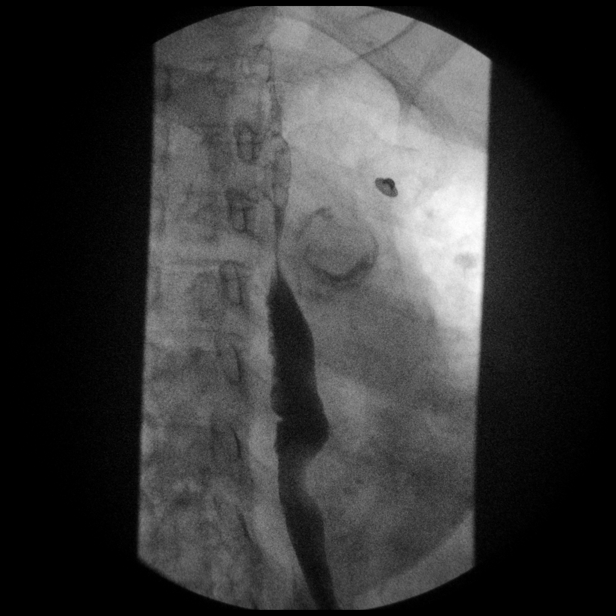

[Series 3: run · 1 of 1 slices shown (3 of 14)]
[im 1/1]
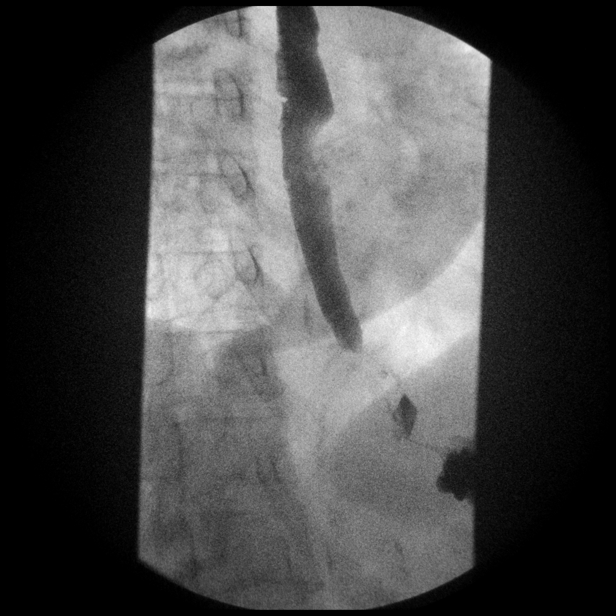

[Series 5: run · 1 of 1 slices shown (4 of 14)]
[im 1/1]
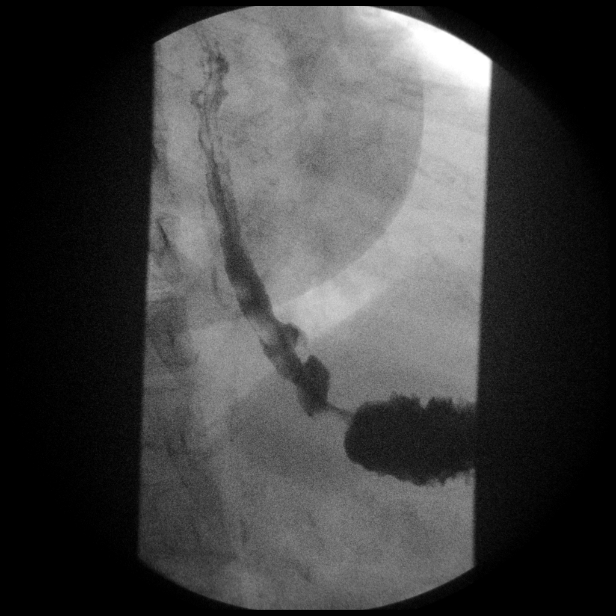

[Series 6: run · 1 of 1 slices shown (5 of 14)]
[im 1/1]
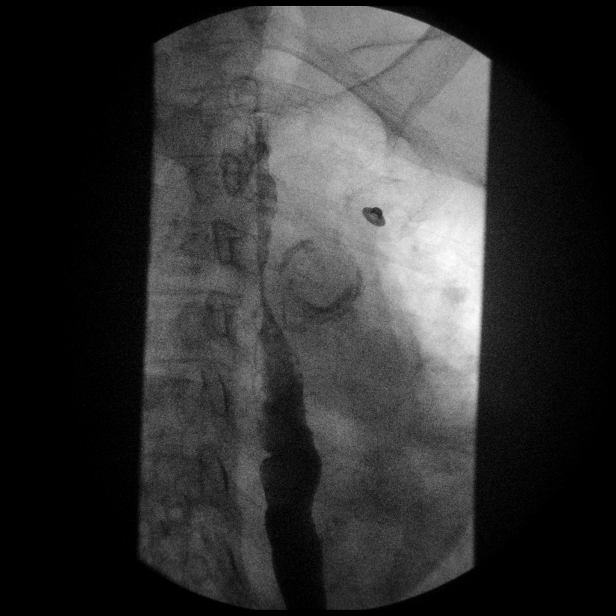

[Series 7: run · 1 of 1 slices shown (6 of 14)]
[im 1/1]
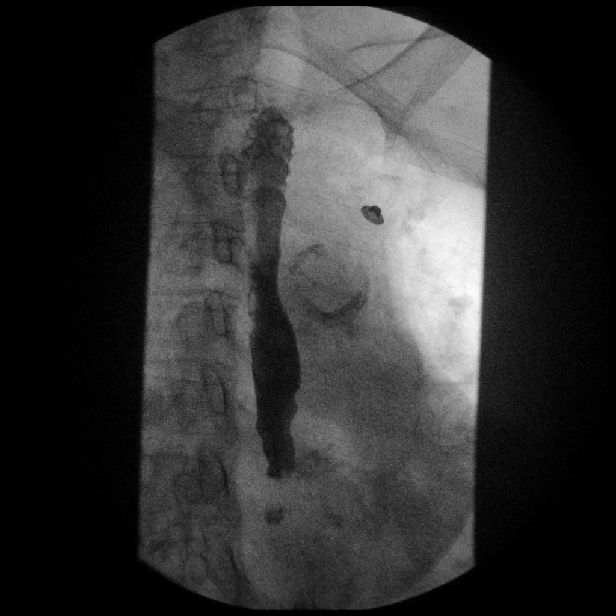

[Series 8: run · 1 of 1 slices shown (7 of 14)]
[im 1/1]
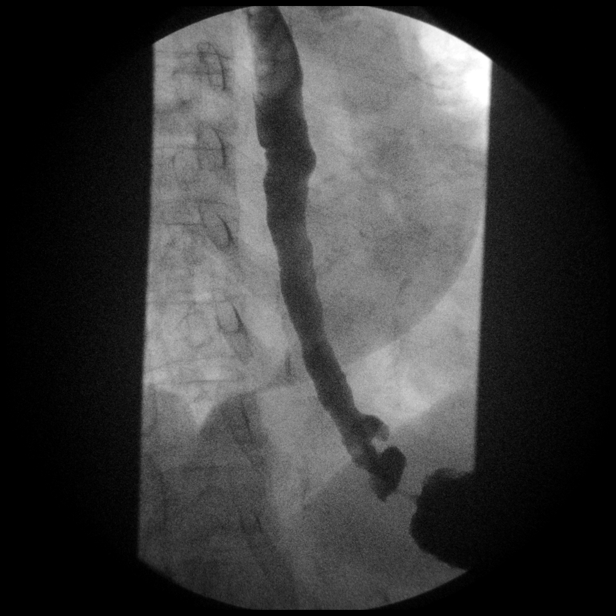

[Series 9: run · 1 of 1 slices shown (8 of 14)]
[im 1/1]
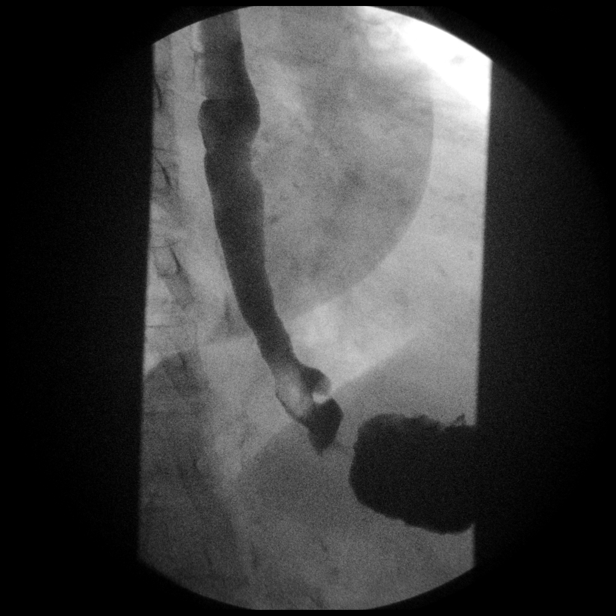

[Series 10: run · 1 of 1 slices shown (9 of 14)]
[im 1/1]
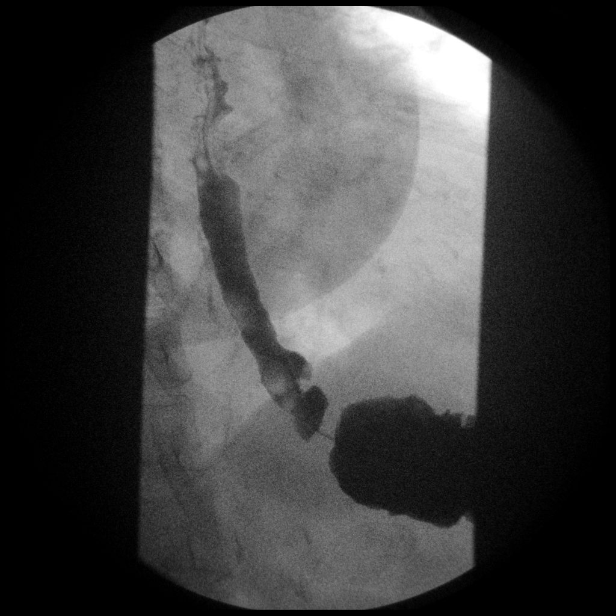

[Series 11: run · 1 of 1 slices shown (10 of 14)]
[im 1/1]
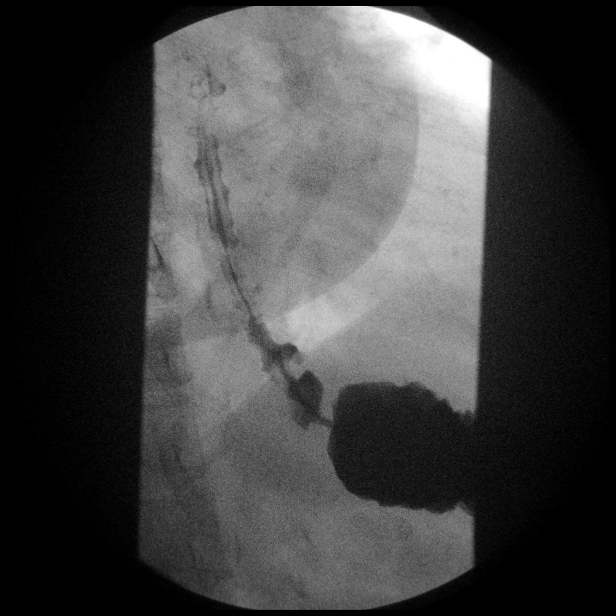

[Series 13: run · 1 of 1 slices shown (11 of 14)]
[im 1/1]
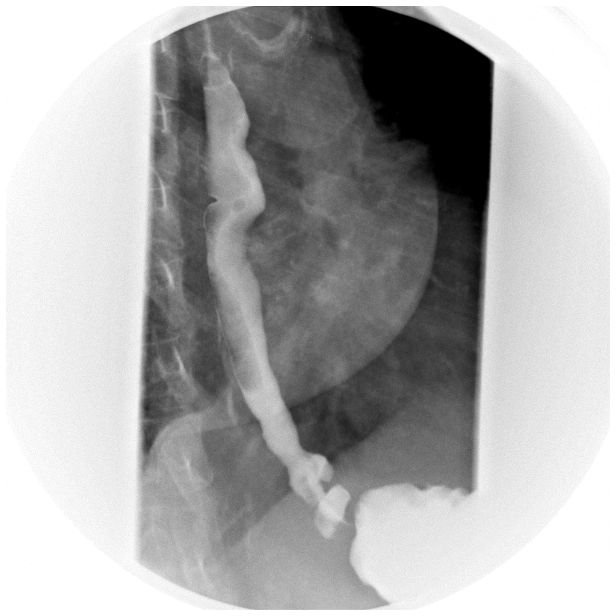

[Series 14: run · 1 of 1 slices shown (12 of 14)]
[im 1/1]
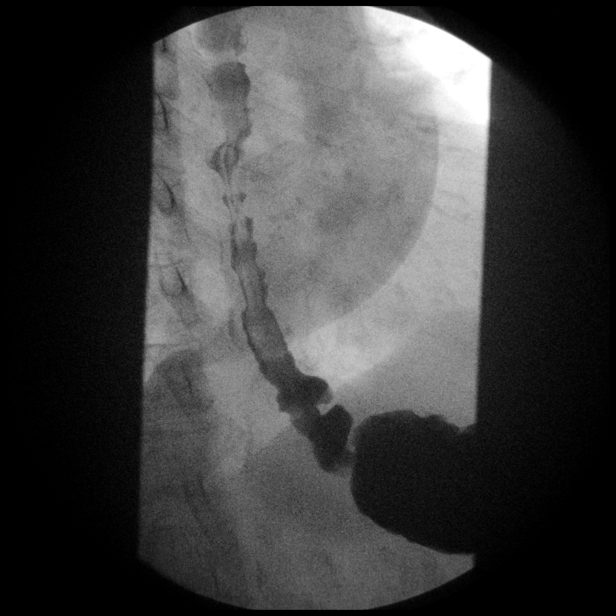

[Series 15: run · 1 of 1 slices shown (13 of 14)]
[im 1/1]
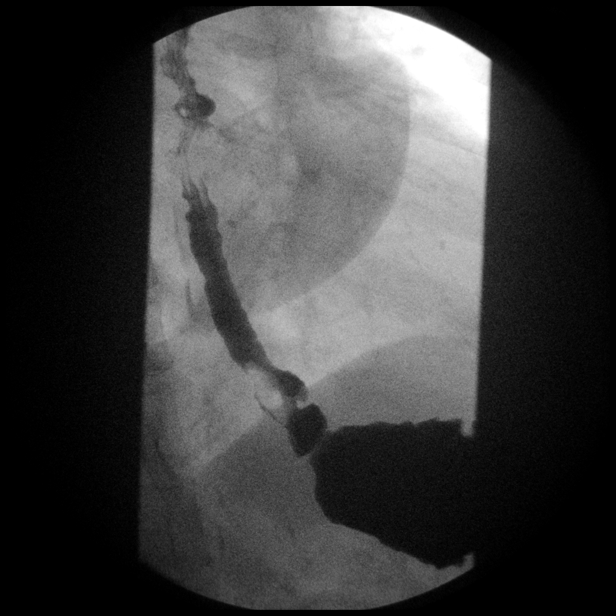

[Series 16: run · 1 of 1 slices shown (14 of 14)]
[im 1/1]
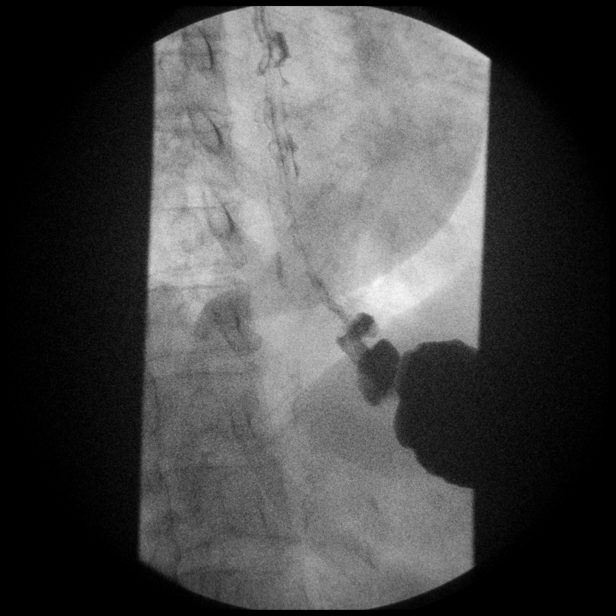

[14 of 16 positions shown; findings below may reference images not displayed]

FINDINGS: A limited examination was performed in the recumbent LPO position.
There are tertiary contractions during swallowing. No definite
esophageal fold thickening, stricture or obstruction. A small
diverticulum is seen off the distal esophagus. A tablet was not
administered due to patient condition.
IMPRESSION: 1. Esophageal dysmotility.
2. Distal esophageal diverticulum.

## 2016-09-23 ENCOUNTER — Telehealth: Payer: Self-pay | Admitting: Internal Medicine

## 2016-09-23 NOTE — Telephone Encounter (Signed)
Returned call to patient's son Mathis Fare He states patient was supposed to have circulation test - he then realized this was ordered by PCP He will contact primary care for this

## 2016-09-23 NOTE — Telephone Encounter (Signed)
New Message  Pt son call requesting to speak with RN about having a circulation test. Please call back to discuss

## 2016-10-11 ENCOUNTER — Observation Stay (HOSPITAL_COMMUNITY): Payer: Medicare Other

## 2016-10-11 ENCOUNTER — Encounter (HOSPITAL_COMMUNITY): Payer: Self-pay | Admitting: Emergency Medicine

## 2016-10-11 ENCOUNTER — Emergency Department (HOSPITAL_COMMUNITY): Payer: Medicare Other

## 2016-10-11 ENCOUNTER — Inpatient Hospital Stay (HOSPITAL_COMMUNITY)
Admission: EM | Admit: 2016-10-11 | Discharge: 2016-10-17 | DRG: 064 | Disposition: A | Discharge status: Hospice | Payer: Medicare Other | Attending: Internal Medicine | Admitting: Internal Medicine

## 2016-10-11 DIAGNOSIS — Z8249 Family history of ischemic heart disease and other diseases of the circulatory system: Secondary | ICD-10-CM

## 2016-10-11 DIAGNOSIS — L988 Other specified disorders of the skin and subcutaneous tissue: Secondary | ICD-10-CM | POA: Diagnosis present

## 2016-10-11 DIAGNOSIS — I69320 Aphasia following cerebral infarction: Secondary | ICD-10-CM

## 2016-10-11 DIAGNOSIS — N184 Chronic kidney disease, stage 4 (severe): Secondary | ICD-10-CM | POA: Diagnosis present

## 2016-10-11 DIAGNOSIS — Z515 Encounter for palliative care: Secondary | ICD-10-CM | POA: Diagnosis present

## 2016-10-11 DIAGNOSIS — R2990 Unspecified symptoms and signs involving the nervous system: Secondary | ICD-10-CM

## 2016-10-11 DIAGNOSIS — L8990 Pressure ulcer of unspecified site, unspecified stage: Secondary | ICD-10-CM | POA: Insufficient documentation

## 2016-10-11 DIAGNOSIS — Z886 Allergy status to analgesic agent status: Secondary | ICD-10-CM

## 2016-10-11 DIAGNOSIS — R413 Other amnesia: Secondary | ICD-10-CM | POA: Diagnosis present

## 2016-10-11 DIAGNOSIS — I63519 Cerebral infarction due to unspecified occlusion or stenosis of unspecified middle cerebral artery: Secondary | ICD-10-CM | POA: Diagnosis not present

## 2016-10-11 DIAGNOSIS — N183 Chronic kidney disease, stage 3 unspecified: Secondary | ICD-10-CM

## 2016-10-11 DIAGNOSIS — K219 Gastro-esophageal reflux disease without esophagitis: Secondary | ICD-10-CM | POA: Diagnosis present

## 2016-10-11 DIAGNOSIS — J449 Chronic obstructive pulmonary disease, unspecified: Secondary | ICD-10-CM

## 2016-10-11 DIAGNOSIS — I130 Hypertensive heart and chronic kidney disease with heart failure and stage 1 through stage 4 chronic kidney disease, or unspecified chronic kidney disease: Secondary | ICD-10-CM | POA: Diagnosis present

## 2016-10-11 DIAGNOSIS — E039 Hypothyroidism, unspecified: Secondary | ICD-10-CM | POA: Diagnosis present

## 2016-10-11 DIAGNOSIS — Z89512 Acquired absence of left leg below knee: Secondary | ICD-10-CM

## 2016-10-11 DIAGNOSIS — E785 Hyperlipidemia, unspecified: Secondary | ICD-10-CM | POA: Diagnosis not present

## 2016-10-11 DIAGNOSIS — I429 Cardiomyopathy, unspecified: Secondary | ICD-10-CM | POA: Diagnosis present

## 2016-10-11 DIAGNOSIS — I639 Cerebral infarction, unspecified: Secondary | ICD-10-CM | POA: Diagnosis present

## 2016-10-11 DIAGNOSIS — Z833 Family history of diabetes mellitus: Secondary | ICD-10-CM

## 2016-10-11 DIAGNOSIS — E1122 Type 2 diabetes mellitus with diabetic chronic kidney disease: Secondary | ICD-10-CM | POA: Diagnosis present

## 2016-10-11 DIAGNOSIS — F0150 Vascular dementia without behavioral disturbance: Secondary | ICD-10-CM | POA: Diagnosis present

## 2016-10-11 DIAGNOSIS — G92 Toxic encephalopathy: Secondary | ICD-10-CM | POA: Diagnosis present

## 2016-10-11 DIAGNOSIS — R410 Disorientation, unspecified: Secondary | ICD-10-CM | POA: Diagnosis not present

## 2016-10-11 DIAGNOSIS — I252 Old myocardial infarction: Secondary | ICD-10-CM

## 2016-10-11 DIAGNOSIS — I10 Essential (primary) hypertension: Secondary | ICD-10-CM

## 2016-10-11 DIAGNOSIS — I69351 Hemiplegia and hemiparesis following cerebral infarction affecting right dominant side: Secondary | ICD-10-CM

## 2016-10-11 DIAGNOSIS — E1142 Type 2 diabetes mellitus with diabetic polyneuropathy: Secondary | ICD-10-CM | POA: Diagnosis present

## 2016-10-11 DIAGNOSIS — R471 Dysarthria and anarthria: Secondary | ICD-10-CM | POA: Diagnosis not present

## 2016-10-11 DIAGNOSIS — I700 Atherosclerosis of aorta: Secondary | ICD-10-CM | POA: Diagnosis present

## 2016-10-11 DIAGNOSIS — R9401 Abnormal electroencephalogram [EEG]: Secondary | ICD-10-CM | POA: Diagnosis present

## 2016-10-11 DIAGNOSIS — G9389 Other specified disorders of brain: Secondary | ICD-10-CM | POA: Diagnosis present

## 2016-10-11 DIAGNOSIS — I5042 Chronic combined systolic (congestive) and diastolic (congestive) heart failure: Secondary | ICD-10-CM | POA: Diagnosis present

## 2016-10-11 DIAGNOSIS — Z87891 Personal history of nicotine dependence: Secondary | ICD-10-CM

## 2016-10-11 DIAGNOSIS — Z79899 Other long term (current) drug therapy: Secondary | ICD-10-CM

## 2016-10-11 DIAGNOSIS — I2510 Atherosclerotic heart disease of native coronary artery without angina pectoris: Secondary | ICD-10-CM | POA: Diagnosis present

## 2016-10-11 DIAGNOSIS — L97519 Non-pressure chronic ulcer of other part of right foot with unspecified severity: Secondary | ICD-10-CM | POA: Diagnosis present

## 2016-10-11 DIAGNOSIS — Z7401 Bed confinement status: Secondary | ICD-10-CM

## 2016-10-11 DIAGNOSIS — Z66 Do not resuscitate: Secondary | ICD-10-CM | POA: Diagnosis present

## 2016-10-11 DIAGNOSIS — R4701 Aphasia: Secondary | ICD-10-CM | POA: Diagnosis present

## 2016-10-11 DIAGNOSIS — F05 Delirium due to known physiological condition: Secondary | ICD-10-CM | POA: Diagnosis present

## 2016-10-11 DIAGNOSIS — I96 Gangrene, not elsewhere classified: Secondary | ICD-10-CM | POA: Insufficient documentation

## 2016-10-11 DIAGNOSIS — Z7982 Long term (current) use of aspirin: Secondary | ICD-10-CM

## 2016-10-11 DIAGNOSIS — E1151 Type 2 diabetes mellitus with diabetic peripheral angiopathy without gangrene: Secondary | ICD-10-CM | POA: Diagnosis present

## 2016-10-11 DIAGNOSIS — R29710 NIHSS score 10: Secondary | ICD-10-CM | POA: Diagnosis present

## 2016-10-11 DIAGNOSIS — R569 Unspecified convulsions: Secondary | ICD-10-CM | POA: Diagnosis present

## 2016-10-11 DIAGNOSIS — I272 Pulmonary hypertension, unspecified: Secondary | ICD-10-CM | POA: Diagnosis present

## 2016-10-11 LAB — COMPREHENSIVE METABOLIC PANEL
ALK PHOS: 71 U/L (ref 38–126)
ALT: 10 U/L — AB (ref 14–54)
AST: 26 U/L (ref 15–41)
Albumin: 3.2 g/dL — ABNORMAL LOW (ref 3.5–5.0)
Anion gap: 10 (ref 5–15)
BILIRUBIN TOTAL: 0.7 mg/dL (ref 0.3–1.2)
BUN: 20 mg/dL (ref 6–20)
CALCIUM: 8.9 mg/dL (ref 8.9–10.3)
CO2: 21 mmol/L — ABNORMAL LOW (ref 22–32)
CREATININE: 2.08 mg/dL — AB (ref 0.44–1.00)
Chloride: 109 mmol/L (ref 101–111)
GFR, EST AFRICAN AMERICAN: 23 mL/min — AB (ref >60)
GFR, EST NON AFRICAN AMERICAN: 20 mL/min — AB (ref >60)
Glucose, Bld: 138 mg/dL — ABNORMAL HIGH (ref 65–99)
Potassium: 3.8 mmol/L (ref 3.5–5.1)
Sodium: 140 mmol/L (ref 135–145)
TOTAL PROTEIN: 5.9 g/dL — AB (ref 6.5–8.1)

## 2016-10-11 LAB — URINALYSIS, COMPLETE (UACMP) WITH MICROSCOPIC
Bilirubin Urine: NEGATIVE
Glucose, UA: NEGATIVE mg/dL
Hgb urine dipstick: NEGATIVE
Ketones, ur: NEGATIVE mg/dL
LEUKOCYTES UA: NEGATIVE
NITRITE: NEGATIVE
PH: 5 (ref 5.0–8.0)
Protein, ur: 100 mg/dL — AB
Specific Gravity, Urine: 1.014 (ref 1.005–1.030)
Squamous Epithelial / LPF: NONE SEEN

## 2016-10-11 LAB — GLUCOSE, CAPILLARY
Glucose-Capillary: 102 mg/dL — ABNORMAL HIGH (ref 65–99)
Glucose-Capillary: 130 mg/dL — ABNORMAL HIGH (ref 65–99)

## 2016-10-11 LAB — CBC WITH DIFFERENTIAL/PLATELET
BASOS ABS: 0 10*3/uL (ref 0.0–0.1)
BASOS PCT: 0 %
EOS ABS: 0.2 10*3/uL (ref 0.0–0.7)
Eosinophils Relative: 3 %
HCT: 34.7 % — ABNORMAL LOW (ref 36.0–46.0)
HEMOGLOBIN: 10.9 g/dL — AB (ref 12.0–15.0)
Lymphocytes Relative: 40 %
Lymphs Abs: 2.2 10*3/uL (ref 0.7–4.0)
MCH: 28.5 pg (ref 26.0–34.0)
MCHC: 31.4 g/dL (ref 30.0–36.0)
MCV: 90.6 fL (ref 78.0–100.0)
MONOS PCT: 6 %
Monocytes Absolute: 0.3 10*3/uL (ref 0.1–1.0)
NEUTROS PCT: 51 %
Neutro Abs: 2.8 10*3/uL (ref 1.7–7.7)
Platelets: 361 10*3/uL (ref 150–400)
RBC: 3.83 MIL/uL — AB (ref 3.87–5.11)
RDW: 14.1 % (ref 11.5–15.5)
WBC: 5.5 10*3/uL (ref 4.0–10.5)

## 2016-10-11 LAB — I-STAT TROPONIN, ED: Troponin i, poc: 0.05 ng/mL (ref 0.00–0.08)

## 2016-10-11 LAB — CBG MONITORING, ED: Glucose-Capillary: 124 mg/dL — ABNORMAL HIGH (ref 65–99)

## 2016-10-11 MED ORDER — POTASSIUM CHLORIDE 20 MEQ/15ML (10%) PO SOLN
20.0000 meq | Freq: Every day | ORAL | Status: DC
  Administered 2016-10-14: 20 meq via ORAL
  Filled 2016-10-11 (×3): qty 15

## 2016-10-11 MED ORDER — FAMOTIDINE 20 MG PO TABS: 20.0000 mg | ORAL_TABLET | Freq: Every evening | ORAL | Status: DC | PRN

## 2016-10-11 MED ORDER — CALCIUM CARBONATE-VITAMIN D 500-200 MG-UNIT PO TABS
1.0000 | ORAL_TABLET | Freq: Every day | ORAL | Status: DC
  Administered 2016-10-14: 1 via ORAL
  Filled 2016-10-11 (×2): qty 1

## 2016-10-11 MED ORDER — SEVELAMER CARBONATE 0.8 G PO PACK
0.8000 g | PACK | Freq: Three times a day (TID) | ORAL | Status: DC
  Administered 2016-10-13 – 2016-10-14 (×2): 0.8 g via ORAL
  Filled 2016-10-11 (×15): qty 1

## 2016-10-11 MED ORDER — ASPIRIN EC 325 MG PO TBEC
325.0000 mg | DELAYED_RELEASE_TABLET | Freq: Every day | ORAL | Status: DC
  Administered 2016-10-14: 325 mg via ORAL
  Filled 2016-10-11 (×2): qty 1

## 2016-10-11 MED ORDER — ASPIRIN 300 MG RE SUPP
300.0000 mg | Freq: Every day | RECTAL | Status: DC
  Administered 2016-10-11 – 2016-10-16 (×4): 300 mg via RECTAL
  Filled 2016-10-11 (×4): qty 1

## 2016-10-11 MED ORDER — HYDRALAZINE HCL 20 MG/ML IJ SOLN: 5.0000 mg | INTRAMUSCULAR | Status: DC | PRN

## 2016-10-11 MED ORDER — PRAVASTATIN SODIUM 40 MG PO TABS
40.0000 mg | ORAL_TABLET | Freq: Every evening | ORAL | Status: DC
  Administered 2016-10-13: 40 mg via ORAL
  Filled 2016-10-11: qty 1

## 2016-10-11 MED ORDER — SODIUM CHLORIDE 0.9 % IV SOLN
15.0000 mg/kg | INTRAVENOUS | Status: AC
  Administered 2016-10-11: 762 mg via INTRAVENOUS
  Filled 2016-10-11: qty 15.24

## 2016-10-11 MED ORDER — SODIUM CHLORIDE 0.9 % IV SOLN
500.0000 mg | Freq: Two times a day (BID) | INTRAVENOUS | Status: DC
  Administered 2016-10-11 – 2016-10-16 (×10): 500 mg via INTRAVENOUS
  Filled 2016-10-11 (×13): qty 5

## 2016-10-11 MED ORDER — SODIUM BICARBONATE 650 MG PO TABS
650.0000 mg | ORAL_TABLET | Freq: Every day | ORAL | Status: DC
  Administered 2016-10-14: 650 mg via ORAL
  Filled 2016-10-11 (×2): qty 1

## 2016-10-11 MED ORDER — STROKE: EARLY STAGES OF RECOVERY BOOK
Freq: Once | Status: AC
  Administered 2016-10-11: 22:00:00

## 2016-10-11 MED ORDER — LEVOTHYROXINE SODIUM 100 MCG PO TABS
100.0000 ug | ORAL_TABLET | Freq: Every day | ORAL | Status: DC
  Administered 2016-10-14: 100 ug via ORAL
  Filled 2016-10-11 (×2): qty 1

## 2016-10-11 MED ORDER — SODIUM CHLORIDE 0.9 % IV SOLN
INTRAVENOUS | Status: DC
  Administered 2016-10-11 – 2016-10-12 (×2): via INTRAVENOUS

## 2016-10-11 MED ORDER — HEPARIN SODIUM (PORCINE) 5000 UNIT/ML IJ SOLN
5000.0000 [IU] | Freq: Three times a day (TID) | INTRAMUSCULAR | Status: DC
  Administered 2016-10-11 – 2016-10-16 (×13): 5000 [IU] via SUBCUTANEOUS
  Filled 2016-10-11 (×13): qty 1

## 2016-10-11 MED ORDER — IPRATROPIUM-ALBUTEROL 0.5-2.5 (3) MG/3ML IN SOLN: 3.0000 mL | Freq: Every evening | RESPIRATORY_TRACT | Status: DC | PRN

## 2016-10-11 NOTE — ED Notes (Signed)
Josh PA notified of pt elevated BP

## 2016-10-11 NOTE — ED Notes (Signed)
Pt to CT

## 2016-10-11 NOTE — ED Triage Notes (Signed)
Per ems,. Pt from home lives with son, states since Sunday shes had some off and on confusion, saying things that dont make sense. Previous hx of stroke in December with some left sided deficits per baseline. Per family baseline is AAOX4 talking, today patient is not really talking.

## 2016-10-11 NOTE — H&P (Signed)
History and Physical    [name] EXB:[id number] DOB: 17-Dec-1923 DOA: 10/11/2016  PCP: Shannon Fick, MD Patient coming from: home  Chief Complaint: aphasia  HPI: [name] is a 83 y.o. female with medical history significant of TIA/stroke, peripheral neuropathy, PVD, MI/CAD, GERD, HLD, DM, C KD stage IV, hypothyroidism, hypertension, memory loss. Patient presenting with acute onset confusion and aphasia. Patient was in her normal state of health until approximately 08:30 on day of admission. Family reports the patient was eating breakfast when she suddenly became confused. She is acting as if she did not know what certain things were that were commented to her and would not follow commands. Patient was unaware when her birth date was. Patient would simply moan in response to questions. Patient was given cervical mantle sitting at the table and would not follow those commands. After initial period of moaning patient would attempt to talk but would not make sense with her words. Denies recent URI type symptoms, chest pain, shortness breath, palpitations, other focal neurological deficits, fevers, neck stiffness, headaches.  Level V caveat applies as pt unable to provide hisotry. History provided by EDP and family members who live with patient.  ED Course: Concern for stroke. Neuro consult. Workup ongoing with objective findings outlined below.  Review of Systems: As per HPI otherwise all other systems reviewed and are negative  Ambulatory Status: Limited due to L BKA  Past Medical History:  Diagnosis Date  . Abnormal nuclear stress test 06/01/09   Demonstrated a new area of infarct scar, peri-infarct ischemia seen in the inferolateral territory. EF eas normal at 70% with mild hypocontractility at the apex, distal inferolateral wall.  . Anemia   . Arthritis   . Cardiomyopathy, idiopathic (HCC) 02/08/2011  . Chronic diastolic CHF (congestive heart failure) (HCC)    Takes Lasix   . Chronic kidney disease (CKD), stage IV (severe) (HCC)   . Coronary artery disease    a. Cath 09/2010 - med rx.  . Diabetes mellitus    type 2 NIDDM  . Dyslipidemia   . GERD (gastroesophageal reflux disease)   . Gout    takes allopurinol  . H/O echocardiogram 09/06/11   Indication- nonIschemic Cardiomyopathy. EF = now greater than 55% with no regional wall motion abnormalities. Tthere is mild to moderate trisuspid regurgitayion and mild pulmonary hypertension with an RVSP of 35 mmHg as well as stage 1 diastolic dysfunction and mild to moderate LVH.  Marland Kitchen Hx of transient ischemic attack (TIA)   . Hypertension   . Hypothyroidism    (SEVERE) Takes Levothryroxine  . Irregular heartbeat   . Memory loss   . Myocardial infarct (HCC)    x 3 unsure of years  . Nonischemic cardiomyopathy (HCC)    EF now is 55%, reduced due to myxedema, which is improved.  . Peripheral neuropathy   . Peripheral vascular disease (HCC)    a. s/p L BKA.  . Shingles   . Stroke (HCC)   . TIA (transient ischemic attack)   . Ulcer     Past Surgical History:  Procedure Laterality Date  . AMPUTATION  07/05/2011   Procedure: AMPUTATION BELOW KNEE;  Surgeon: Chuck Hint, MD;  Location: [facility] OR;  Service: Vascular;  Laterality: Left;  . ANGIOPLASTY  1988  . AV FISTULA PLACEMENT    . AV FISTULA PLACEMENT Right 12/24/2013   Procedure: INSERTION OF ARTERIOVENOUS (AV) GORE-TEX GRAFT ARM;  Surgeon: Chuck Hint, MD;  Location: [facility] OR;  Service: Vascular;  Laterality: Right;  . BACK SURGERY     [facility]  . CARDIAC CATHETERIZATION  09/28/07   Demonstrated multiple sequential lesions around 40 to 30% in the RCA territory.  . Duplex doppler  05/10/11   LE arterial dopplers demonstrate bilaterally reduced ABIs of 0.91 on right & 0.56 on left. She does report some decreased pain on the left, & there's moderate mixed-density plaque in the right SFA w/50 to 69% reduction. There's a 69% reduction in the  left SFA & does appear to be occlusive disease of left posterior tibial artery. Right posterior dorsalis pedis artery demonstrates occlusive disease  . ESOPHAGOGASTRODUODENOSCOPY N/A 04/15/2014   Procedure: ESOPHAGOGASTRODUODENOSCOPY (EGD);  Surgeon: Rachael Fee, MD;  Location: [facility] ENDOSCOPY;  Service: Endoscopy;  Laterality: N/A;  . EYE SURGERY     Left eye surgery; cataract removal  . INSERTION OF DIALYSIS CATHETER N/A 01/06/2013   Procedure: INSERTION OF DIALYSIS CATHETER;  Surgeon: Larina Earthly, MD;  Location: [facility] OR;  Service: Vascular;  Laterality: N/A;  . left bka  07/05/2011  . left lower extremity venous duplex Left 06/27/11   Summary: No evidence of DVT involving the left lower extremity and right common femoral vein.   Marland Kitchen LIGATION ARTERIOVENOUS GORTEX GRAFT Right 03/25/2014   Procedure: LIGATION ARTERIOVENOUS GORTEX GRAFT;  Surgeon: Chuck Hint, MD;  Location[facility] OR;  Service: Vascular;  Laterality: Right;  . LOWER EXTREMITY ANGIOGRAM N/A 10/31/2011   Procedure: LOWER EXTREMITY ANGIOGRAM;  Surgeon: Chuck Hint, MD;  Location: [facility] CATH LAB;  Service: Cardiovascular;  Laterality: N/A;  . Lower extremity arterial evaluation  06/27/11   SUMMARY: Right: ABI not ascertained due to false elevation in BP secondary to calcification (posterior tibial artery is non compressible). Left: ABI indicates moderate reduction in arterial flow. Bilateral: Great toe PPG waveforms indicate adequate perfusion. Great toe pressures not obtained due to patient's movements secondary to pain.  Marland Kitchen SHUNTOGRAM N/A 03/17/2014   Procedure: Betsey Amen;  Surgeon: Fransisco Hertz, MD;  Location: [facility] CATH LAB;  Service: Cardiovascular;  Laterality: N/A;    Social History   Social History  . Marital status: Widowed    Spouse name: N/A  . Number of children: 4  . Years of education: N/A   Occupational History  . homemaker    Social History Main Topics  . Smoking status: Former Smoker    Packs/day: 0.50     Years: 40.00    Quit date: 07/05/1986  . Smokeless tobacco: Former Neurosurgeon     Comment: quit smoking 1988  . Alcohol use No  . Drug use: No  . Sexual activity: No   Other Topics Concern  . Not on file   Social History Narrative  . No narrative on file    Allergies  Allergen Reactions  . Aspirin Nausea And Vomiting and Other (See Comments)    325 mg (adult strength) [name]     Family History  Problem Relation Age of Onset  . Heart attack Daughter   . Heart disease Daughter        Before age 42  . Cancer Sister        STOMACH  . Diabetes Sister   . Cancer Brother        BONE  . Diabetes Brother   . Hyperlipidemia Daughter   . Hypertension Daughter   . Heart disease Daughter  before age 82  . Kidney disease Daughter   . Other Daughter        varicose veins  . Diabetes Daughter   . Heart disease Son        before age 35  . Hyperlipidemia Son   . Hypertension Son   . Heart attack Son   . Anesthesia problems Neg Hx   . Hypotension Neg Hx   . Malignant hyperthermia Neg Hx   . Pseudochol deficiency Neg Hx       Prior to Admission medications   Medication Sig Start Date End Date Taking? Authorizing Provider  acetaminophen (TYLENOL) 325 MG tablet Take 650 mg by mouth every 6 (six) hours as needed for mild pain.   Yes [provider]  acetaminophen (TYLENOL) 325 MG tablet Take 2 tablets (650 mg total) by mouth every 6 (six) hours as needed for mild pain (or Fever >/= 101). 08/16/16  Yes Johnson, Clanford L, MD  albuterol-ipratropium (COMBIVENT) 18-103 MCG/ACT inhaler Inhale 1-2 puffs into the lungs at bedtime as needed for wheezing or shortness of breath.    Yes [provider]  aspirin EC 81 MG tablet Take 81 mg by mouth daily.   Yes [provider]  Calcium Carbonate-Vitamin D (CALCIUM-VITAMIN D) 500-200 MG-UNIT tablet Take 1 tablet by mouth daily.   Yes [provider]   docusate sodium (COLACE) 100 MG capsule Take 100 mg by mouth daily as needed for mild constipation.   Yes [provider]  famotidine (PEPCID) 20 MG tablet Take 1 tablet (20 mg total) by mouth at bedtime. Patient taking differently: Take 20 mg by mouth at bedtime as needed for heartburn.  11/13/15  Yes Rodolph Bong, MD  furosemide (LASIX) 40 MG tablet Take 1 tablet (40 mg total) by mouth daily. 08/16/16  Yes Johnson, Clanford L, MD  hydrALAZINE (APRESOLINE) 50 MG tablet Take 1 tablet (50 mg total) by mouth 3 (three) times daily. 08/16/16  Yes Johnson, Clanford L, MD  isosorbide dinitrate (ISORDIL) 20 MG tablet Take 1 tablet (20 mg total) by mouth 3 (three) times daily. 04/22/16  Yes Maxie Barb, MD  levothyroxine (SYNTHROID, LEVOTHROID) 100 MCG tablet Take 1 tablet (100 mcg total) by mouth daily. 04/22/16  Yes Maxie Barb, MD  metoprolol tartrate (LOPRESSOR) 25 MG tablet Take 0.5 tablets (12.5 mg total) by mouth 2 (two) times daily. 04/22/16  Yes Maxie Barb, MD  potassium chloride 20 MEQ/15ML (10%) SOLN Take 15 mLs (20 mEq total) by mouth daily. 04/22/16  Yes Maxie Barb, MD  pravastatin (PRAVACHOL) 40 MG tablet Take 40 mg by mouth every evening.    Yes [provider]  sevelamer carbonate (RENVELA) 0.8 g PACK packet Take 0.8 g by mouth 3 (three) times daily with meals. 04/22/16  Yes Maxie Barb, MD  sodium bicarbonate 650 MG tablet Take 1 tablet (650 mg total) by mouth daily. 11/13/15  Yes Rodolph Bong, MD  aspirin 325 MG tablet Take 1 tablet (325 mg total) by mouth daily. Patient not taking: Reported on 10/11/2016 08/17/16   Cleora Fleet, MD  colchicine 0.6 MG tablet Take 1 tablet (0.6 mg total) by mouth daily. Take two tablets by mouth 06/07/16 and then take one tablet daily until symptoms resolve. Patient not taking: Reported on 10/11/2016 08/16/16   Cleora Fleet, MD  feeding supplement (BOOST / RESOURCE BREEZE) LIQD  Take 1 Container by mouth 3 (three) times daily between meals. Patient not  taking: Reported on 10/11/2016 11/13/15   Rodolph Bong, MD    Physical Exam: Vitals:   10/11/16 1445 10/11/16 1500 10/11/16 1530 10/11/16 1621  BP: (!) 205/80 (!) 199/86 (!) 179/90 (!) 202/61  Pulse: 66 65  65  Resp: 14 17 10 14   Temp:    97.6 F (36.4 C)  TempSrc:    Axillary  SpO2: 97% 97%  100%  Weight:         General:  Appears calm and comfortable Eyes:  PERRL, EOMI, normal lids, iris ENT: Moist mucous members, edentulous  Neck:  no LAD, masses or thyromegaly Cardiovascular:  RRR, 206 systolic murmur No LE edema.  Respiratory:  CTA bilaterally, no w/r/r. Normal respiratory effort. Abdomen:  soft, ntnd, NABS Skin:  no rash or induration seen on limited exam Musculoskeletal: Left BKA noted. Right third fourth and fifth toes dark in appearance with ulcerations. No effusions or other bony abnormalities appreciated. Psychiatric: Follows basic commands. Pleasant. Neurologic: No facial asymmetry. Sensation intact. When asked to describe symptoms patient can answer with one word statements. Anything beyond one word patient begins to use unrelated words. When asked to say her alphabet there are some slurring of her speech. Patient is able to say the first 2 layers of the alphabet N/A continue to perseverate on various letters within the alphabet completely jumbled. Lastly subsequent question patient continued to give letters from the alphabet. CN 2-12 grossly intact, moves all extremities in coordinated fashion, sensation intact  Labs on Admission: I have personally reviewed following labs and imaging studies  CBC:  Recent Labs Lab 10/11/16 1031  WBC 5.5  NEUTROABS 2.8  HGB 10.9*  HCT 34.7*  MCV 90.6  PLT 361   Basic Metabolic Panel:  Recent Labs Lab 10/11/16 1031  NA 140  K 3.8  CL 109  CO2 21*  GLUCOSE 138*  BUN 20  CREATININE 2.08*  CALCIUM 8.9   GFR: Estimated Creatinine  Clearance: 12.1 mL/min (A) (by C-G formula based on SCr of 2.08 mg/dL (H)). Liver Function Tests:  Recent Labs Lab 10/11/16 1031  AST 26  ALT 10*  ALKPHOS 71  BILITOT 0.7  PROT 5.9*  ALBUMIN 3.2*   No results for input(s): LIPASE, AMYLASE in the last 168 hours. No results for input(s): AMMONIA in the last 168 hours. Coagulation Profile: No results for input(s): INR, PROTIME in the last 168 hours. Cardiac Enzymes: No results for input(s): CKTOTAL, CKMB, CKMBINDEX, TROPONINI in the last 168 hours. BNP (last 3 results) No results for input(s): PROBNP in the last 8760 hours. HbA1C: No results for input(s): HGBA1C in the last 72 hours. CBG:  Recent Labs Lab 10/11/16 1132  GLUCAP 124*   Lipid Profile: No results for input(s): CHOL, HDL, LDLCALC, TRIG, CHOLHDL, LDLDIRECT in the last 72 hours. Thyroid Function Tests: No results for input(s): TSH, T4TOTAL, FREET4, T3FREE, THYROIDAB in the last 72 hours. Anemia Panel: No results for input(s): VITAMINB12, FOLATE, FERRITIN, TIBC, IRON, RETICCTPCT in the last 72 hours. Urine analysis:    Component Value Date/Time   COLORURINE YELLOW 10/11/2016 1115   APPEARANCEUR CLEAR 10/11/2016 1115   LABSPEC 1.014 10/11/2016 1115   PHURINE 5.0 10/11/2016 1115   GLUCOSEU NEGATIVE 10/11/2016 1115   HGBUR NEGATIVE 10/11/2016 1115   BILIRUBINUR NEGATIVE 10/11/2016 1115   KETONESUR NEGATIVE 10/11/2016 1115   PROTEINUR 100 (A) 10/11/2016 1115   UROBILINOGEN 0.2 08/18/2013 1807   NITRITE NEGATIVE 10/11/2016 1115   LEUKOCYTESUR NEGATIVE 10/11/2016 1115    Creatinine  Clearance: Estimated Creatinine Clearance: 12.1 mL/min (A) (by C-G formula based on SCr of 2.08 mg/dL (H)).  Sepsis Labs: @LABRCNTIP (procalcitonin:4,lacticidven:4) )No results found for this or any previous visit (from the past 240 hour(s)).   Radiological Exams on Admission: Ct Head Wo Contrast  Result Date: 10/11/2016 CLINICAL DATA:  Aphasia. EXAM: CT HEAD WITHOUT CONTRAST  TECHNIQUE: Contiguous axial images were obtained from the base of the skull through the vertex without intravenous contrast. COMPARISON:  MRI and CT 08/12/2016 FINDINGS: Brain: severe chronic small vessel disease throughout the deep white matter and diffuse cerebral atrophy. Low-density in the posterior parietal lobes bilaterally compatible with chronic infarct. Small old lacunar infarct in the left thalamus no acute infarction or hemorrhage. Ventriculomegaly is stable related to ex vacuo dilatation. Vascular: No hyperdense vessel or unexpected calcification. Skull: No acute calvarial abnormality. Sinuses/Orbits: Visualized paranasal sinuses and mastoids clear. Orbital soft tissues unremarkable. Other: None IMPRESSION: Advanced atrophy and chronic small vessel disease changes. Ex vacuo dilatation of the ventricles. Chronic bilateral parietal infarcts and left thalamic lacunar infarct. No acute intracranial abnormality. Electronically Signed   By: Charlett Nose M.D.   On: 10/11/2016 11:07   Dg Chest Port 1 View  Result Date: 10/11/2016 CLINICAL DATA:  Altered mental status. EXAM: PORTABLE CHEST 1 VIEW COMPARISON:  Chest x-ray dated Aug 12, 2016. FINDINGS: The cardiomediastinal silhouette is mildly enlarged, unchanged. Atherosclerotic calcification of the aortic arch. Normal pulmonary vascularity. Mild bibasilar atelectasis. No focal consolidation, pleural effusion, or pneumothorax. Osteopenia. No acute osseous abnormality. IMPRESSION: 1.  No active cardiopulmonary disease. 2.  Aortic Atherosclerosis (ICD10-I70.0). Electronically Signed   By: Obie Dredge M.D.   On: 10/11/2016 11:18    EKG: Independently reviewed. Sinus. RBBB No ACS  Assessment/Plan Active Problems:   Stroke (cerebrum) (HCC)   Stroke-like symptoms   CKD (chronic kidney disease), stage III   GERD (gastroesophageal reflux disease)   COPD (chronic obstructive pulmonary disease) (HCC)   HLD (hyperlipidemia)   Hypothyroid     Dysarthria/confusion: stroke vs Seizure. Neuro following. CT head w/o acute abnormality.  - EEG - MRI (if + for stroke then continue stroke workup per NEuro) - Neuro checks - Allow permissive HTN  HTN: - allow permissive HTN until stroke r/o - resume isordil, metop, lasix, hydralazine after 24 hrs or negativ eMRI - Hydralazine PRN BP >210/110  R foot digit ulcerations: Pt w/ ongoing outpt vascualar care. No evidence of acute worsening of chronic wounds necessitating emergent workup. Pt w/ PAD and is s/op L BKA.  - continue outpt vascular workup - Space boot.   Chronic diastolic and systolic congestive heart failure: Last echo showing an EF of 20% and grade 2 diastolic dysfunction. No evidence of due to conversation. -Patient hold Lasix and beta blocker until a.m. - Strict I's and O's, daily weights  CKD:Cr 2.0./ at baseline - continue renal vitamins  COPD: - continue combivent  Hypothyroid: - continue synthroid  GERD: - contineu pepcid  HLD: - continue pravastatin  DVT prophylaxis: hep  Code Status: DNR  Family Communication: son  Disposition Plan: pending workup  Consults called: neuro  Admission status: observation    Myda Detwiler J MD [facility]  If 7PM-7AM, please contact night-coverage [email or web address] Password [facility]  10/11/2016, 5:04 PM

## 2016-10-11 NOTE — ED Notes (Signed)
Dr Isaacs at bedside 

## 2016-10-11 NOTE — Progress Notes (Signed)
Patient arrived to 5M14 from ED at this time. Family at bedside. Safety precautions and orders reviewed with pt/family. B/P remained high per attending Dr. Konrad Dolores MD aware. TELE applied and confirmed. Patient denied pain or discomfort at this time. Will continue to monitor.   Sim Boast, RN

## 2016-10-11 NOTE — Procedures (Signed)
ELECTROENCEPHALOGRAM REPORT  Date of Study: 10/11/2016  Patient's Name: [name] MRN: [id number] Date of Birth: September 05, 1923  Referring Provider: Felicie Morn, PA-C  Clinical History: This is a 83 year old man with altered mental status.  Medications: fosPHENYtoin (CEREBYX) 762 mg PE in sodium chloride 0.9 % 50 mL IVPB   levETIRAcetam (KEPPRA) 500 mg in sodium chloride 0.9 % 100 mL IVPB   Technical Summary: A multichannel digital EEG recording measured by the international 10-20 system with electrodes applied with paste and impedances below 5000 ohms performed as portable with EKG monitoring in an awake and drowsy patient.  Hyperventilation and photic stimulation were not performed.  The digital EEG was referentially recorded, reformatted, and digitally filtered in a variety of bipolar and referential montages for optimal display.   Description: The patient is awake and drowsy during the recording. He is noted to be confused. During maximal wakefulness, there is a symmetric, medium voltage 7 Hz posterior dominant rhythm that attenuates with eye opening. This is admixed with a moderate amount of diffuse 4-5 Hz theta and 2-3 Hz delta slowing of the waking background.  During drowsiness, there is an increase in theta and delta slowing of the background. Normal sleep architecture is not  Seen. Hyperventilation and photic stimulation were not performed.  There were no epileptiform discharges or electrographic seizures seen.    EKG lead showed occasional extrasystolic beats.  Impression: This awake and drowsy EEG is abnormal due to moderate diffuse slowing of the waking background.  Clinical Correlation of the above findings indicates diffuse cerebral dysfunction that is non-specific in etiology and can be seen with hypoxic/ischemic injury, toxic/metabolic encephalopathies, neurodegenerative disorders, or medication effect.  The absence of epileptiform discharges does not rule out a clinical  diagnosis of epilepsy.  Clinical correlation is advised.   Patrcia Dolly, M.D.

## 2016-10-11 NOTE — Consult Note (Signed)
Requesting Physician: Dr. Konrad Dolores    Chief Complaint: Stroke versus seizure  History obtained from:  Family  HPI:                                                                                                                                         [name] is an 83 y.o. female  Who has history of stroke, mRS 4 at home, CAD, recentl strokes who is at baseline minimally conversant and has cognitive impairment at baseline was brought in for assessment because family/nurse noted that the patient was less verbal than baseline.She is non-verbal at times and at times can communicate but at a lower level of thought processing. Patient was noted to be AMS by her caregiver today and brought in to hospital for further evaluation. On consultation, patient appears to be both receptive and expressive aphasic, not able to follow commands and encephalopathic. This according to family can happen to her sometimes on her 'bad days'. She lives with brother who takes care of all her ADLs.   Date last known well: Date: 10/10/2016 Time last known well: Unable to determine tPA Given: No: out of window Modified Rankin: Rankin Score=4   Past Medical History:  Diagnosis Date  . Abnormal nuclear stress test 06/01/09   Demonstrated a new area of infarct scar, peri-infarct ischemia seen in the inferolateral territory. EF eas normal at 70% with mild hypocontractility at the apex, distal inferolateral wall.  . Anemia   . Arthritis   . Cardiomyopathy, idiopathic (HCC) 02/08/2011  . Chronic diastolic CHF (congestive heart failure) (HCC)    Takes Lasix  . Chronic kidney disease (CKD), stage IV (severe) (HCC)   . Coronary artery disease    a. Cath 09/2010 - med rx.  . Diabetes mellitus    type 2 NIDDM  . Dyslipidemia   . GERD (gastroesophageal reflux disease)   . Gout    takes allopurinol  . H/O echocardiogram 09/06/11   Indication- nonIschemic Cardiomyopathy. EF = now greater than 55% with no regional wall  motion abnormalities. Tthere is mild to moderate trisuspid regurgitayion and mild pulmonary hypertension with an RVSP of 35 mmHg as well as stage 1 diastolic dysfunction and mild to moderate LVH.  Marland Kitchen Hx of transient ischemic attack (TIA)   . Hypertension   . Hypothyroidism    (SEVERE) Takes Levothryroxine  . Irregular heartbeat   . Memory loss   . Myocardial infarct (HCC)    x 3 unsure of years  . Nonischemic cardiomyopathy (HCC)    EF now is 55%, reduced due to myxedema, which is improved.  . Peripheral neuropathy   . Peripheral vascular disease (HCC)    a. s/p L BKA.  . Shingles   . Stroke (HCC)   . TIA (transient ischemic attack)   . Ulcer     Past Surgical History:  Procedure Laterality Date  .  AMPUTATION  07/05/2011   Procedure: AMPUTATION BELOW KNEE;  Surgeon: Chuck Hint, MD;  Location: [facility] OR;  Service: Vascular;  Laterality: Left;  . ANGIOPLASTY  1988  . AV FISTULA PLACEMENT    . AV FISTULA PLACEMENT Right 12/24/2013   Procedure: INSERTION OF ARTERIOVENOUS (AV) GORE-TEX GRAFT ARM;  Surgeon: Chuck Hint, MD;  Location: [facility] OR;  Service: Vascular;  Laterality: Right;  . BACK SURGERY     [facility]  . CARDIAC CATHETERIZATION  09/28/07   Demonstrated multiple sequential lesions around 40 to 30% in the RCA territory.  . Duplex doppler  05/10/11   LE arterial dopplers demonstrate bilaterally reduced ABIs of 0.91 on right & 0.56 on left. She does report some decreased pain on the left, & there's moderate mixed-density plaque in the right SFA w/50 to 69% reduction. There's a 69% reduction in the left SFA & does appear to be occlusive disease of left posterior tibial artery. Right posterior dorsalis pedis artery demonstrates occlusive disease  . ESOPHAGOGASTRODUODENOSCOPY N/A 04/15/2014   Procedure: ESOPHAGOGASTRODUODENOSCOPY (EGD);  Surgeon: Rachael Fee, MD;  Location: [facility] ENDOSCOPY;  Service: Endoscopy;  Laterality: N/A;  . EYE SURGERY     Left eye  surgery; cataract removal  . INSERTION OF DIALYSIS CATHETER N/A 01/06/2013   Procedure: INSERTION OF DIALYSIS CATHETER;  Surgeon: Larina Earthly, MD;  Location: [facility] OR;  Service: Vascular;  Laterality: N/A;  . left bka  07/05/2011  . left lower extremity venous duplex Left 06/27/11   Summary: No evidence of DVT involving the left lower extremity and right common femoral vein.   Marland Kitchen LIGATION ARTERIOVENOUS GORTEX GRAFT Right 03/25/2014   Procedure: LIGATION ARTERIOVENOUS GORTEX GRAFT;  Surgeon: Chuck Hint, MD;  Location: [facility] OR;  Service: Vascular;  Laterality: Right;  . LOWER EXTREMITY ANGIOGRAM N/A 10/31/2011   Procedure: LOWER EXTREMITY ANGIOGRAM;  Surgeon: Chuck Hint, MD;  Location: [facility] CATH LAB;  Service: Cardiovascular;  Laterality: N/A;  . Lower extremity arterial evaluation  06/27/11   SUMMARY: Right: ABI not ascertained due to false elevation in BP secondary to calcification (posterior tibial artery is non compressible). Left: ABI indicates moderate reduction in arterial flow. Bilateral: Great toe PPG waveforms indicate adequate perfusion. Great toe pressures not obtained due to patient's movements secondary to pain.  Marland Kitchen SHUNTOGRAM N/A 03/17/2014   Procedure: Betsey Amen;  Surgeon: Fransisco Hertz, MD;  Location: [facility] CATH LAB;  Service: Cardiovascular;  Laterality: N/A;    Family History  Problem Relation Age of Onset  . Heart attack Daughter   . Heart disease Daughter        Before age 44  . Cancer Sister        STOMACH  . Diabetes Sister   . Cancer Brother        BONE  . Diabetes Brother   . Hyperlipidemia Daughter   . Hypertension Daughter   . Heart disease Daughter        before age 64  . Kidney disease Daughter   . Other Daughter        varicose veins  . Diabetes Daughter   . Heart disease Son        before age 27  . Hyperlipidemia Son   . Hypertension Son   . Heart attack Son   . Anesthesia problems Neg Hx   . Hypotension Neg Hx   . Malignant hyperthermia Neg  Hx   . Pseudochol deficiency Neg Hx    Social  History:  reports that she quit smoking about 30 years ago. She has a 20.00 pack-year smoking history. She has quit using smokeless tobacco. She reports that she does not drink alcohol or use drugs.  Allergies:  Allergies  Allergen Reactions  . Aspirin Nausea And Vomiting and Other (See Comments)    325 mg (adult strength) [name]     Medications:                                                                                                                           Current Facility-Administered Medications  Medication Dose Route Frequency Provider Last Rate Last Dose  . fosPHENYtoin (CEREBYX) 762 mg PE in sodium chloride 0.9 % 50 mL IVPB  15 mg PE/kg Intravenous STAT Ulice Dash, PA-C      . levETIRAcetam (KEPPRA) 500 mg in sodium chloride 0.9 % 100 mL IVPB  500 mg Intravenous Q12H Ulice Dash, PA-C       Current Outpatient Prescriptions  Medication Sig Dispense Refill  . acetaminophen (TYLENOL) 325 MG tablet Take 650 mg by mouth every 6 (six) hours as needed for mild pain.    Marland Kitchen acetaminophen (TYLENOL) 325 MG tablet Take 2 tablets (650 mg total) by mouth every 6 (six) hours as needed for mild pain (or Fever >/= 101).    Marland Kitchen albuterol-ipratropium (COMBIVENT) 18-103 MCG/ACT inhaler Inhale 1-2 puffs into the lungs at bedtime as needed for wheezing or shortness of breath.     Marland Kitchen aspirin EC 81 MG tablet Take 81 mg by mouth daily.    . Calcium Carbonate-Vitamin D (CALCIUM-VITAMIN D) 500-200 MG-UNIT tablet Take 1 tablet by mouth daily.    Marland Kitchen docusate sodium (COLACE) 100 MG capsule Take 100 mg by mouth daily as needed for mild constipation.    . famotidine (PEPCID) 20 MG tablet Take 1 tablet (20 mg total) by mouth at bedtime. (Patient taking differently: Take 20 mg by mouth at bedtime as needed for heartburn. ) 30 tablet 3  . furosemide (LASIX) 40 MG tablet Take 1 tablet (40 mg total)  by mouth daily.    . hydrALAZINE (APRESOLINE) 50 MG tablet Take 1 tablet (50 mg total) by mouth 3 (three) times daily.    . isosorbide dinitrate (ISORDIL) 20 MG tablet Take 1 tablet (20 mg total) by mouth 3 (three) times daily. 90 tablet 0  . levothyroxine (SYNTHROID, LEVOTHROID) 100 MCG tablet Take 1 tablet (100 mcg total) by mouth daily. 30 tablet 0  . metoprolol tartrate (LOPRESSOR) 25 MG tablet Take 0.5 tablets (12.5 mg total) by mouth 2 (two) times daily. 60 tablet 0  . potassium chloride 20 MEQ/15ML (10%) SOLN Take 15 mLs (20 mEq total) by mouth daily. 200 mL 0  . pravastatin (PRAVACHOL) 40 MG tablet Take 40 mg by mouth every evening.     . sevelamer carbonate (RENVELA) 0.8 g PACK packet Take 0.8 g  by mouth 3 (three) times daily with meals. 270 each 0  . sodium bicarbonate 650 MG tablet Take 1 tablet (650 mg total) by mouth daily. 30 tablet 0     ROS:                                                                                                                                       History obtained from family  General ROS: negative for - chills, fatigue, fever, night sweats, weight gain or weight loss Psychological ROS: negative for - behavioral disorder, hallucinations, memory difficulties, mood swings or suicidal ideation Ophthalmic ROS: negative for - blurry vision, double vision, eye pain or loss of vision ENT ROS: negative for - epistaxis, nasal discharge, oral lesions, sore throat, tinnitus or vertigo Allergy and Immunology ROS: negative for - hives or itchy/watery eyes Hematological and Lymphatic ROS: negative for - bleeding problems, bruising or swollen lymph nodes Endocrine ROS: negative for - galactorrhea, hair pattern changes, polydipsia/polyuria or temperature intolerance Respiratory ROS: negative for - cough, hemoptysis, shortness of breath or wheezing Cardiovascular ROS: negative for - chest pain, dyspnea on exertion, edema or irregular heartbeat Gastrointestinal ROS:  negative for - abdominal pain, diarrhea, hematemesis, nausea/vomiting or stool incontinence Genito-Urinary ROS: negative for - dysuria, hematuria, incontinence or urinary frequency/urgency Musculoskeletal ROS: negative for - joint swelling or muscular weakness Neurological ROS: as noted in HPI Dermatological ROS: negative for rash and skin lesion changes  Neurologic Examination:                                                                                                      Blood pressure (!) 198/81, pulse 69, temperature 98.6 F (37 C), temperature source Rectal, resp. rate 12, weight 50.8 kg (112 lb), SpO2 95 %.  HEENT-  Normocephalic, no lesions, without obvious abnormality.  Normal external eye and conjunctiva.  Normal TM's bilaterally.  Normal auditory canals and external ears. Normal external nose, mucus membranes and septum.  Normal pharynx. Cardiovascular- regularly irregular rhythm, pulses palpable throughout   Lungs- chest clear, no wheezing, rales, normal symmetric air entry Abdomen- normal findings: bowel sounds normal Extremities- no edema Lymph-no adenopathy palpable Musculoskeletal-no joint tenderness, deformity or swelling Skin-warm and dry, no hyperpigmentation, vitiligo, or suspicious lesions  Neurological Examination Mental Status: Alert,not oriented, follows no verbal of visual commands. Aphasic. Able to mimic  Cranial Nerves: II: no blink to threat and when asked to count fingers pereseseverates on a intangible word.   III,IV, VI: ptosis  not present, extra-ocular motions intact bilaterally, pupils equal, round, reactive to light and accommodation V,VII: smile symmetric, facial light touch sensation normal bilaterally VIII: hearing normal bilaterally IX,X: uvula rises symmetrically XI: bilateral shoulder shrug XII: midline tongue extension Motor: Moving all ext antigravity with a left BKA Sensory: Pinprick and light touch intact throughout, bilaterally Deep  Tendon Reflexes: 2+ and symmetric throughout UE and no KJ or AJ Plantars: Right: downgoing   Left: BKA Cerebellar: No dysmetria noted with arm movements.  Gait: not tested   Lab Results: Basic Metabolic Panel:  Recent Labs Lab 10/11/16 1031  NA 140  K 3.8  CL 109  CO2 21*  GLUCOSE 138*  BUN 20  CREATININE 2.08*  CALCIUM 8.9    Liver Function Tests:  Recent Labs Lab 10/11/16 1031  AST 26  ALT 10*  ALKPHOS 71  BILITOT 0.7  PROT 5.9*  ALBUMIN 3.2*   No results for input(s): LIPASE, AMYLASE in the last 168 hours. No results for input(s): AMMONIA in the last 168 hours.  CBC:  Recent Labs Lab 10/11/16 1031  WBC 5.5  NEUTROABS 2.8  HGB 10.9*  HCT 34.7*  MCV 90.6  PLT 361    Cardiac Enzymes: No results for input(s): CKTOTAL, CKMB, CKMBINDEX, TROPONINI in the last 168 hours.  Lipid Panel: No results for input(s): CHOL, TRIG, HDL, CHOLHDL, VLDL, LDLCALC in the last 168 hours.  CBG:  Recent Labs Lab 10/11/16 1132  GLUCAP 124*    Microbiology: Results for orders placed or performed during the hospital encounter of 04/18/16  MRSA PCR Screening     Status: None   Collection Time: 04/18/16  8:50 PM  Result Value Ref Range Status   MRSA by PCR NEGATIVE NEGATIVE Final    Comment:        The GeneXpert MRSA Assay (FDA approved for NASAL specimens only), is one component of a comprehensive MRSA colonization surveillance program. It is not intended to diagnose MRSA infection nor to guide or monitor treatment for MRSA infections.     Coagulation Studies: No results for input(s): LABPROT, INR in the last 72 hours.  Imaging: Ct Head Wo Contrast  Result Date: 10/11/2016 CLINICAL DATA:  Aphasia. EXAM: CT HEAD WITHOUT CONTRAST TECHNIQUE: Contiguous axial images were obtained from the base of the skull through the vertex without intravenous contrast. COMPARISON:  MRI and CT 08/12/2016 FINDINGS: Brain: severe chronic small vessel disease throughout the  deep white matter and diffuse cerebral atrophy. Low-density in the posterior parietal lobes bilaterally compatible with chronic infarct. Small old lacunar infarct in the left thalamus no acute infarction or hemorrhage. Ventriculomegaly is stable related to ex vacuo dilatation. Vascular: No hyperdense vessel or unexpected calcification. Skull: No acute calvarial abnormality. Sinuses/Orbits: Visualized paranasal sinuses and mastoids clear. Orbital soft tissues unremarkable. Other: None IMPRESSION: Advanced atrophy and chronic small vessel disease changes. Ex vacuo dilatation of the ventricles. Chronic bilateral parietal infarcts and left thalamic lacunar infarct. No acute intracranial abnormality. Electronically Signed   By: Charlett Nose M.D.   On: 10/11/2016 11:07   Dg Chest Port 1 View  Result Date: 10/11/2016 CLINICAL DATA:  Altered mental status. EXAM: PORTABLE CHEST 1 VIEW COMPARISON:  Chest x-ray dated Aug 12, 2016. FINDINGS: The cardiomediastinal silhouette is mildly enlarged, unchanged. Atherosclerotic calcification of the aortic arch. Normal pulmonary vascularity. Mild bibasilar atelectasis. No focal consolidation, pleural effusion, or pneumothorax. Osteopenia. No acute osseous abnormality. IMPRESSION: 1.  No active cardiopulmonary disease. 2.  Aortic Atherosclerosis (ICD10-I70.0). Electronically Signed  By: Obie Dredge M.D.   On: 10/11/2016 11:18   Assessment and plan discussed with with attending physician and they are in agreement.    Felicie Morn PA-C [facility] [phone]  10/11/2016, 2:22 PM   Assessment: 83 y.o. female with new onset encephalopathy/posssible stroke. Per nurse the verbalization waxes and wanes thus cannot exclude possible seizure , however less likely.   Stroke Risk Factors - diabetes mellitus, hyperlipidemia and hypertension    Impression Toxic metabolic enceph Stroke Seizure Stroke recrudescence  Recommend: 1) MRI brain--if negative for  stroke no further stroke work up needed 2) EEG (ordered)-has been loaded with dilantin due to renal function and will start renal dose of Keppra 500 mg BID 3) Continue to evaluate for causes of toxic/metabolic encephalopathy Call with questions.   Neurohospitalist addendum:  patient seen and examined independently. Agree with H&P above. Note modified and edited with my updates. Recs as above.   Milon Dikes, MD [facility] [phone]  If 7pm to 7am, please call on call as listed on AMION.

## 2016-10-11 NOTE — ED Notes (Signed)
Pt returned from CT, Xray at bedside

## 2016-10-11 NOTE — ED Notes (Signed)
Per Dr. Konrad Dolores, pt is okay to have elevated BP at this time. No new interventions.

## 2016-10-11 NOTE — ED Notes (Signed)
EEG still at bedside. Attempted report

## 2016-10-11 NOTE — ED Provider Notes (Signed)
[facility]-EMERGENCY DEPT Provider Note   CSN: [id number] Arrival date & time: 10/11/16  1012     History   Chief Complaint Chief Complaint  Patient presents with  . Altered Mental Status    HPI [name] is a 83 y.o. female.  Patient with history of stroke, CHF, diabetes, DO NOT RESUSCITATE -- brought in by son with complaint of new onset confusion. Patient was in her typical state of health until eating breakfast at about 8:30 AM. She became acutely confused and did not know certain things that she typically would, like her birthday. She moans a lot and was not following directions, such as her son asking her to pick up a piece of bacon.  When she did talk, it didn't make sense. No recent symptoms of illness or infection. EMS was called for transport to hospital. Level V caveat due to altered mental status.      Past Medical History:  Diagnosis Date  . Abnormal nuclear stress test 06/01/09   Demonstrated a new area of infarct scar, peri-infarct ischemia seen in the inferolateral territory. EF eas normal at 70% with mild hypocontractility at the apex, distal inferolateral wall.  . Anemia   . Arthritis   . Cardiomyopathy, idiopathic (HCC) 02/08/2011  . Chronic diastolic CHF (congestive heart failure) (HCC)    Takes Lasix  . Chronic kidney disease (CKD), stage IV (severe) (HCC)   . Coronary artery disease    a. Cath 09/2010 - med rx.  . Diabetes mellitus    type 2 NIDDM  . Dyslipidemia   . GERD (gastroesophageal reflux disease)   . Gout    takes allopurinol  . H/O echocardiogram 09/06/11   Indication- nonIschemic Cardiomyopathy. EF = now greater than 55% with no regional wall motion abnormalities. Tthere is mild to moderate trisuspid regurgitayion and mild pulmonary hypertension with an RVSP of 35 mmHg as well as stage 1 diastolic dysfunction and mild to moderate LVH.  Marland Kitchen Hx of transient ischemic attack (TIA)   . Hypertension   . Hypothyroidism    (SEVERE) Takes  Levothryroxine  . Irregular heartbeat   . Memory loss   . Myocardial infarct (HCC)    x 3 unsure of years  . Nonischemic cardiomyopathy (HCC)    EF now is 55%, reduced due to myxedema, which is improved.  . Peripheral neuropathy   . Peripheral vascular disease (HCC)    a. s/p L BKA.  . Shingles   . Stroke (HCC)   . TIA (transient ischemic attack)   . Ulcer     Patient Active Problem List   Diagnosis Date Noted  . Acute CVA (cerebrovascular accident) (HCC) 08/13/2016  . Slurred speech 08/12/2016  . Acute gout due to renal impairment involving right shoulder 06/07/2016  . S/P AKA (above knee amputation) unilateral, left (HCC)   . CHF (congestive heart failure) (HCC) 04/18/2016  . Respiratory distress   . Acute on chronic combined systolic and diastolic CHF (congestive heart failure) (HCC) 01/16/2016  . Palliative care encounter   . Altered mental status   . Goals of care, counseling/discussion   . Palliative care by specialist   . Aphasia following other cerebrovascular disease   . Hemi-neglect of right side   . Cerebral thrombosis with cerebral infarction 12/30/2015  . Cerebrovascular accident (CVA) involving left middle cerebral artery territory [facility])   . Altered mental state 12/29/2015  . Acute right-sided weakness 12/29/2015  . Dysphagia   . FTT (failure to thrive) in adult  11/08/2015  . Nausea & vomiting 11/08/2015  . Metabolic acidosis 11/08/2015  . End stage renal disease (HCC) 10/07/2015  . Hypertensive heart disease with heart failure (HCC) 10/07/2015  . Junctional bradycardia 09/18/2015  . Atypical chest pain 06/23/2014  . Neck pain   . Food impaction of esophagus   . Epiglottitis 04/13/2014  . Wound disruption, post-op, skin 02/05/2014  . Pre-operative cardiovascular examination 12/13/2013  . Foot pain 09/04/2013  . CKD (chronic kidney disease) stage 4, GFR 15-29 ml/min (HCC) 09/04/2013  . Essential hypertension 08/23/2013  . Malnutrition of moderate degree  (HCC) 08/21/2013  . Chronic diastolic heart failure (HCC) 08/15/2013  . Anemia of chronic disease 08/15/2013  . Hyperkalemia 08/15/2013  . 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 Dyslipidemia 01/05/2013  . Chronic renal insufficiency, stage IV (severe)- HD started 01/05/13 01/05/2013  . Diabetes mellitus type 2 with peripheral artery disease (HCC) 01/05/2013  . Peripheral vascular disease (HCC) 10/19/2011  . CAD - moderate at cath July 2012 (medical Rx) 02/08/2011  . 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 Severe hypothyroidism 02/08/2011    Past Surgical History:  Procedure Laterality Date  . AMPUTATION  07/05/2011   Procedure: AMPUTATION BELOW KNEE;  Surgeon: Chuck Hint, MD;  Location: [facility] OR;  Service: Vascular;  Laterality: Left;  . ANGIOPLASTY  1988  . AV FISTULA PLACEMENT    . AV FISTULA PLACEMENT Right 12/24/2013   Procedure: INSERTION OF ARTERIOVENOUS (AV) GORE-TEX GRAFT ARM;  Surgeon: Chuck Hint, MD;  Location: [facility] OR;  Service: Vascular;  Laterality: Right;  . BACK SURGERY     [facility]  . CARDIAC CATHETERIZATION  09/28/07   Demonstrated multiple sequential lesions around 40 to 30% in the RCA territory.  . Duplex doppler  05/10/11   LE arterial dopplers demonstrate bilaterally reduced ABIs of 0.91 on right & 0.56 on left. She does report some decreased pain on the left, & there's moderate mixed-density plaque in the right SFA w/50 to 69% reduction. There's a 69% reduction in the left SFA & does appear to be occlusive disease of left posterior tibial artery. Right posterior dorsalis pedis artery demonstrates occlusive disease  . ESOPHAGOGASTRODUODENOSCOPY N/A 04/15/2014   Procedure: ESOPHAGOGASTRODUODENOSCOPY (EGD);  Surgeon: Rachael Fee, MD;  Location: [facility] ENDOSCOPY;  Service: Endoscopy;  Laterality: N/A;  . EYE SURGERY     Left eye surgery; cataract removal  . INSERTION OF DIALYSIS  CATHETER N/A 01/06/2013   Procedure: INSERTION OF DIALYSIS CATHETER;  Surgeon: Larina Earthly, MD;  Location: [facility] OR;  Service: Vascular;  Laterality: N/A;  . left bka  07/05/2011  . left lower extremity venous duplex Left 06/27/11   Summary: No evidence of DVT involving the left lower extremity and right common femoral vein.   Marland Kitchen LIGATION ARTERIOVENOUS GORTEX GRAFT Right 03/25/2014   Procedure: LIGATION ARTERIOVENOUS GORTEX GRAFT;  Surgeon: Chuck Hint, MD;  Location: [facility] OR;  Service: Vascular;  Laterality: Right;  . LOWER EXTREMITY ANGIOGRAM N/A 10/31/2011   Procedure: LOWER EXTREMITY ANGIOGRAM;  Surgeon: Chuck Hint, MD;  Location: [facility] CATH LAB;  Service: Cardiovascular;  Laterality: N/A;  . Lower extremity arterial evaluation  06/27/11   SUMMARY: Right: ABI not ascertained due to false elevation in BP secondary to calcification (posterior tibial artery is non compressible). Left: ABI indicates moderate reduction in arterial flow. Bilateral: Great toe PPG waveforms indicate adequate perfusion. Great toe pressures not obtained due to patient's  movements secondary to pain.  Marland Kitchen SHUNTOGRAM N/A 03/17/2014   Procedure: Betsey Amen;  Surgeon: Fransisco Hertz, MD;  Location: [facility] CATH LAB;  Service: Cardiovascular;  Laterality: N/A;    OB History    No data available       Home Medications    Prior to Admission medications   Medication Sig Start Date End Date Taking? Authorizing Provider  acetaminophen (TYLENOL) 325 MG tablet Take 650 mg by mouth every 6 (six) hours as needed for mild pain.   Yes [provider]  acetaminophen (TYLENOL) 325 MG tablet Take 2 tablets (650 mg total) by mouth every 6 (six) hours as needed for mild pain (or Fever >/= 101). 08/16/16  Yes Johnson, Clanford L, MD  albuterol-ipratropium (COMBIVENT) 18-103 MCG/ACT inhaler Inhale 1-2 puffs into the lungs at bedtime as needed for wheezing or shortness of breath.    Yes [provider]  aspirin EC 81 MG  tablet Take 81 mg by mouth daily.   Yes [provider]  Calcium Carbonate-Vitamin D (CALCIUM-VITAMIN D) 500-200 MG-UNIT tablet Take 1 tablet by mouth daily.   Yes [provider]  docusate sodium (COLACE) 100 MG capsule Take 100 mg by mouth daily as needed for mild constipation.   Yes [provider]  famotidine (PEPCID) 20 MG tablet Take 1 tablet (20 mg total) by mouth at bedtime. Patient taking differently: Take 20 mg by mouth at bedtime as needed for heartburn.  11/13/15  Yes Rodolph Bong, MD  furosemide (LASIX) 40 MG tablet Take 1 tablet (40 mg total) by mouth daily. 08/16/16  Yes Johnson, Clanford L, MD  hydrALAZINE (APRESOLINE) 50 MG tablet Take 1 tablet (50 mg total) by mouth 3 (three) times daily. 08/16/16  Yes Johnson, Clanford L, MD  isosorbide dinitrate (ISORDIL) 20 MG tablet Take 1 tablet (20 mg total) by mouth 3 (three) times daily. 04/22/16  Yes Maxie Barb, MD  levothyroxine (SYNTHROID, LEVOTHROID) 100 MCG tablet Take 1 tablet (100 mcg total) by mouth daily. 04/22/16  Yes Maxie Barb, MD  metoprolol tartrate (LOPRESSOR) 25 MG tablet Take 0.5 tablets (12.5 mg total) by mouth 2 (two) times daily. 04/22/16  Yes Maxie Barb, MD  potassium chloride 20 MEQ/15ML (10%) SOLN Take 15 mLs (20 mEq total) by mouth daily. 04/22/16  Yes Maxie Barb, MD  pravastatin (PRAVACHOL) 40 MG tablet Take 40 mg by mouth every evening.    Yes [provider]  sevelamer carbonate (RENVELA) 0.8 g PACK packet Take 0.8 g by mouth 3 (three) times daily with meals. 04/22/16  Yes Maxie Barb, MD  sodium bicarbonate 650 MG tablet Take 1 tablet (650 mg total) by mouth daily. 11/13/15  Yes Rodolph Bong, MD  aspirin 325 MG tablet Take 1 tablet (325 mg total) by mouth daily. Patient not taking: Reported on 10/11/2016 08/17/16   Cleora Fleet, MD  colchicine 0.6 MG tablet Take 1 tablet (0.6 mg total) by mouth daily. Take two tablets by  mouth 06/07/16 and then take one tablet daily until symptoms resolve. Patient not taking: Reported on 10/11/2016 08/16/16   Cleora Fleet, MD  feeding supplement (BOOST / RESOURCE BREEZE) LIQD Take 1 Container by mouth 3 (three) times daily between meals. Patient not taking: Reported on 10/11/2016 11/13/15   Rodolph Bong, MD    Family History Family History  Problem Relation Age of Onset  . Heart attack Daughter   . Heart disease Daughter  Before age 75  . Cancer Sister        STOMACH  . Diabetes Sister   . Cancer Brother        BONE  . Diabetes Brother   . Hyperlipidemia Daughter   . Hypertension Daughter   . Heart disease Daughter        before age 35  . Kidney disease Daughter   . Other Daughter        varicose veins  . Diabetes Daughter   . Heart disease Son        before age 55  . Hyperlipidemia Son   . Hypertension Son   . Heart attack Son   . Anesthesia problems Neg Hx   . Hypotension Neg Hx   . Malignant hyperthermia Neg Hx   . Pseudochol deficiency Neg Hx     Social History Social History  Substance Use Topics  . Smoking status: Former Smoker    Packs/day: 0.50    Years: 40.00    Quit date: 07/05/1986  . Smokeless tobacco: Former Neurosurgeon     Comment: quit smoking 1988  . Alcohol use No     Allergies   Aspirin   Review of Systems Review of Systems  Unable to perform ROS: Mental status change     Physical Exam Updated Vital Signs BP 140/88   Pulse 62   Temp 98.6 F (37 C) (Rectal)   Resp (!) 21   Wt 50.8 kg (112 lb)   SpO2 100%   BMI 24.24 kg/m   Physical Exam  Constitutional: She appears well-developed and well-nourished.  HENT:  Head: Normocephalic and atraumatic.  Eyes: Conjunctivae are normal. Right eye exhibits no discharge. Left eye exhibits no discharge.  Neck: Normal range of motion. Neck supple.  Cardiovascular: Normal rate, regular rhythm and normal heart sounds.   Pulmonary/Chest: Effort normal and breath sounds  normal.  Abdominal: Soft. There is no tenderness.  Musculoskeletal: She exhibits no edema or tenderness.  Previous left BKA  Neurological: She is alert. She exhibits normal muscle tone. GCS eye subscore is 4. GCS verbal subscore is 5. GCS motor subscore is 6.  Patient unable to cooperate with neuro exam. She does not answer my questions. She is moaning. Per nurse, she did respond with her name.  Skin: Skin is warm and dry.  Psychiatric: She has a normal mood and affect.  Nursing note and vitals reviewed.    ED Treatments / Results  Labs (all labs ordered are listed, but only abnormal results are displayed) Labs Reviewed  COMPREHENSIVE METABOLIC PANEL - Abnormal; Notable for the following:       Result Value   CO2 21 (*)    Glucose, Bld 138 (*)    Creatinine, Ser 2.08 (*)    Total Protein 5.9 (*)    Albumin 3.2 (*)    ALT 10 (*)    GFR calc non Af Amer 20 (*)    GFR calc Af Amer 23 (*)    All other components within normal limits  CBC WITH DIFFERENTIAL/PLATELET - Abnormal; Notable for the following:    RBC 3.83 (*)    Hemoglobin 10.9 (*)    HCT 34.7 (*)    All other components within normal limits  URINALYSIS, COMPLETE (UACMP) WITH MICROSCOPIC - Abnormal; Notable for the following:    Protein, ur 100 (*)    Bacteria, UA FEW (*)    All other components within normal limits  CBG MONITORING, ED - Abnormal;  Notable for the following:    Glucose-Capillary 124 (*)    All other components within normal limits  I-STAT TROPONIN, ED    EKG  EKG Interpretation  Date/Time:  Tuesday October 11 2016 10:37:38 EDT Ventricular Rate:  63 PR Interval:    QRS Duration: 132 QT Interval:  489 QTC Calculation: 501 R Axis:   19 Text Interpretation:  Sinus rhythm Probable left atrial enlargement Right bundle branch block Repol abnrm, probable ischemia, lateral leads No significant change since last tracing Confirmed by Shaune Pollack [phone]) on 10/11/2016 10:39:59 AM Also confirmed by Shaune Pollack [phone]), editor Elita Quick ([id number])  on 10/11/2016 10:59:58 AM       Radiology Ct Head Wo Contrast  Result Date: 10/11/2016 CLINICAL DATA:  Aphasia. EXAM: CT HEAD WITHOUT CONTRAST TECHNIQUE: Contiguous axial images were obtained from the base of the skull through the vertex without intravenous contrast. COMPARISON:  MRI and CT 08/12/2016 FINDINGS: Brain: severe chronic small vessel disease throughout the deep white matter and diffuse cerebral atrophy. Low-density in the posterior parietal lobes bilaterally compatible with chronic infarct. Small old lacunar infarct in the left thalamus no acute infarction or hemorrhage. Ventriculomegaly is stable related to ex vacuo dilatation. Vascular: No hyperdense vessel or unexpected calcification. Skull: No acute calvarial abnormality. Sinuses/Orbits: Visualized paranasal sinuses and mastoids clear. Orbital soft tissues unremarkable. Other: None IMPRESSION: Advanced atrophy and chronic small vessel disease changes. Ex vacuo dilatation of the ventricles. Chronic bilateral parietal infarcts and left thalamic lacunar infarct. No acute intracranial abnormality. Electronically Signed   By: Charlett Nose M.D.   On: 10/11/2016 11:07   Dg Chest Port 1 View  Result Date: 10/11/2016 CLINICAL DATA:  Altered mental status. EXAM: PORTABLE CHEST 1 VIEW COMPARISON:  Chest x-ray dated Aug 12, 2016. FINDINGS: The cardiomediastinal silhouette is mildly enlarged, unchanged. Atherosclerotic calcification of the aortic arch. Normal pulmonary vascularity. Mild bibasilar atelectasis. No focal consolidation, pleural effusion, or pneumothorax. Osteopenia. No acute osseous abnormality. IMPRESSION: 1.  No active cardiopulmonary disease. 2.  Aortic Atherosclerosis (ICD10-I70.0). Electronically Signed   By: Obie Dredge M.D.   On: 10/11/2016 11:18    Procedures Procedures (including critical care time)  Medications Ordered in ED Medications - No data to  display   Initial Impression / Assessment and Plan / ED Course  I have reviewed the triage vital signs and the nursing notes.  Pertinent labs & imaging results that were available during my care of the patient were reviewed by me and considered in my medical decision making (see chart for details).     Patient seen and examined. Work-up initiated. Discussed with Dr. Erma Heritage. Patient is not a tPA candidate given co-morbid medical conditions, patient/family does not wish to have any heroic measures, last stroke 08/12/16. Confirmed with son that she is DNR and would not like any heroic measures undertaken such as a breathing tube or CPR.   Vital signs reviewed and are as follows: BP 140/88   Pulse 62   Temp 98.6 F (37 C) (Rectal)   Resp (!) 21   Wt 50.8 kg (112 lb)   SpO2 100%   BMI 24.24 kg/m   Patient discussed with and seen by Dr. Erma Heritage.   Family does not feel comfortable with caring for her at home with this degree of confusion. Will admit.   Spoke with Dr. Konrad Dolores who will see patient. Requests neuro consult -- called.   Final Clinical Impressions(s) / ED Diagnoses   Final diagnoses:  Delirium  Hypertension, unspecified type   Patient with aloc, possible aphasia and stroke. Admit.   New Prescriptions Current Discharge Medication List        Renne Crigler, Cordelia Poche 10/11/16 1408    Shaune Pollack, MD 10/11/16 [phone]

## 2016-10-11 NOTE — Progress Notes (Signed)
STAT EEG completed. Results pending. 

## 2016-10-12 ENCOUNTER — Observation Stay (HOSPITAL_COMMUNITY): Payer: Medicare Other

## 2016-10-12 DIAGNOSIS — E785 Hyperlipidemia, unspecified: Secondary | ICD-10-CM | POA: Diagnosis not present

## 2016-10-12 DIAGNOSIS — I69351 Hemiplegia and hemiparesis following cerebral infarction affecting right dominant side: Secondary | ICD-10-CM | POA: Diagnosis not present

## 2016-10-12 DIAGNOSIS — I361 Nonrheumatic tricuspid (valve) insufficiency: Secondary | ICD-10-CM | POA: Diagnosis not present

## 2016-10-12 DIAGNOSIS — L97519 Non-pressure chronic ulcer of other part of right foot with unspecified severity: Secondary | ICD-10-CM | POA: Diagnosis present

## 2016-10-12 DIAGNOSIS — R29710 NIHSS score 10: Secondary | ICD-10-CM | POA: Diagnosis present

## 2016-10-12 DIAGNOSIS — G9389 Other specified disorders of brain: Secondary | ICD-10-CM | POA: Diagnosis present

## 2016-10-12 DIAGNOSIS — Z66 Do not resuscitate: Secondary | ICD-10-CM | POA: Diagnosis present

## 2016-10-12 DIAGNOSIS — R569 Unspecified convulsions: Secondary | ICD-10-CM | POA: Diagnosis present

## 2016-10-12 DIAGNOSIS — E039 Hypothyroidism, unspecified: Secondary | ICD-10-CM | POA: Diagnosis not present

## 2016-10-12 DIAGNOSIS — E1122 Type 2 diabetes mellitus with diabetic chronic kidney disease: Secondary | ICD-10-CM | POA: Diagnosis present

## 2016-10-12 DIAGNOSIS — I429 Cardiomyopathy, unspecified: Secondary | ICD-10-CM | POA: Diagnosis present

## 2016-10-12 DIAGNOSIS — E1151 Type 2 diabetes mellitus with diabetic peripheral angiopathy without gangrene: Secondary | ICD-10-CM | POA: Diagnosis present

## 2016-10-12 DIAGNOSIS — E1142 Type 2 diabetes mellitus with diabetic polyneuropathy: Secondary | ICD-10-CM | POA: Diagnosis present

## 2016-10-12 DIAGNOSIS — I700 Atherosclerosis of aorta: Secondary | ICD-10-CM | POA: Diagnosis present

## 2016-10-12 DIAGNOSIS — G92 Toxic encephalopathy: Secondary | ICD-10-CM | POA: Diagnosis present

## 2016-10-12 DIAGNOSIS — I10 Essential (primary) hypertension: Secondary | ICD-10-CM | POA: Diagnosis not present

## 2016-10-12 DIAGNOSIS — I5042 Chronic combined systolic (congestive) and diastolic (congestive) heart failure: Secondary | ICD-10-CM | POA: Diagnosis present

## 2016-10-12 DIAGNOSIS — J449 Chronic obstructive pulmonary disease, unspecified: Secondary | ICD-10-CM | POA: Diagnosis not present

## 2016-10-12 DIAGNOSIS — R29818 Other symptoms and signs involving the nervous system: Secondary | ICD-10-CM | POA: Diagnosis not present

## 2016-10-12 DIAGNOSIS — N184 Chronic kidney disease, stage 4 (severe): Secondary | ICD-10-CM | POA: Diagnosis present

## 2016-10-12 DIAGNOSIS — I6300 Cerebral infarction due to thrombosis of unspecified precerebral artery: Secondary | ICD-10-CM

## 2016-10-12 DIAGNOSIS — Z515 Encounter for palliative care: Secondary | ICD-10-CM | POA: Diagnosis not present

## 2016-10-12 DIAGNOSIS — R410 Disorientation, unspecified: Secondary | ICD-10-CM | POA: Diagnosis present

## 2016-10-12 DIAGNOSIS — R2990 Unspecified symptoms and signs involving the nervous system: Secondary | ICD-10-CM | POA: Diagnosis not present

## 2016-10-12 DIAGNOSIS — I130 Hypertensive heart and chronic kidney disease with heart failure and stage 1 through stage 4 chronic kidney disease, or unspecified chronic kidney disease: Secondary | ICD-10-CM | POA: Diagnosis present

## 2016-10-12 DIAGNOSIS — I96 Gangrene, not elsewhere classified: Secondary | ICD-10-CM | POA: Diagnosis not present

## 2016-10-12 DIAGNOSIS — F0150 Vascular dementia without behavioral disturbance: Secondary | ICD-10-CM | POA: Diagnosis present

## 2016-10-12 DIAGNOSIS — I69320 Aphasia following cerebral infarction: Secondary | ICD-10-CM | POA: Diagnosis not present

## 2016-10-12 DIAGNOSIS — I63519 Cerebral infarction due to unspecified occlusion or stenosis of unspecified middle cerebral artery: Secondary | ICD-10-CM | POA: Diagnosis present

## 2016-10-12 DIAGNOSIS — N183 Chronic kidney disease, stage 3 (moderate): Secondary | ICD-10-CM | POA: Diagnosis not present

## 2016-10-12 DIAGNOSIS — R4701 Aphasia: Secondary | ICD-10-CM | POA: Diagnosis present

## 2016-10-12 DIAGNOSIS — I272 Pulmonary hypertension, unspecified: Secondary | ICD-10-CM | POA: Diagnosis present

## 2016-10-12 DIAGNOSIS — R471 Dysarthria and anarthria: Secondary | ICD-10-CM | POA: Diagnosis present

## 2016-10-12 DIAGNOSIS — I639 Cerebral infarction, unspecified: Secondary | ICD-10-CM | POA: Diagnosis present

## 2016-10-12 DIAGNOSIS — F05 Delirium due to known physiological condition: Secondary | ICD-10-CM | POA: Diagnosis present

## 2016-10-12 LAB — BASIC METABOLIC PANEL
ANION GAP: 9 (ref 5–15)
BUN: 17 mg/dL (ref 6–20)
CHLORIDE: 114 mmol/L — AB (ref 101–111)
CO2: 20 mmol/L — AB (ref 22–32)
Calcium: 8.8 mg/dL — ABNORMAL LOW (ref 8.9–10.3)
Creatinine, Ser: 1.68 mg/dL — ABNORMAL HIGH (ref 0.44–1.00)
GFR calc Af Amer: 30 mL/min — ABNORMAL LOW (ref >60)
GFR calc non Af Amer: 26 mL/min — ABNORMAL LOW (ref >60)
GLUCOSE: 89 mg/dL (ref 65–99)
POTASSIUM: 3.8 mmol/L (ref 3.5–5.1)
Sodium: 143 mmol/L (ref 135–145)

## 2016-10-12 LAB — CBC
HEMATOCRIT: 32 % — AB (ref 36.0–46.0)
HEMOGLOBIN: 10 g/dL — AB (ref 12.0–15.0)
MCH: 28.1 pg (ref 26.0–34.0)
MCHC: 31.3 g/dL (ref 30.0–36.0)
MCV: 89.9 fL (ref 78.0–100.0)
Platelets: 287 10*3/uL (ref 150–400)
RBC: 3.56 MIL/uL — AB (ref 3.87–5.11)
RDW: 13.6 % (ref 11.5–15.5)
WBC: 5.1 10*3/uL (ref 4.0–10.5)

## 2016-10-12 LAB — LIPID PANEL
CHOLESTEROL: 174 mg/dL (ref 0–200)
HDL: 47 mg/dL (ref >40)
LDL CALC: 106 mg/dL — AB (ref 0–99)
TRIGLYCERIDES: 106 mg/dL (ref <150)
Total CHOL/HDL Ratio: 3.7 RATIO
VLDL: 21 mg/dL (ref 0–40)

## 2016-10-12 LAB — GLUCOSE, CAPILLARY
GLUCOSE-CAPILLARY: 79 mg/dL (ref 65–99)
GLUCOSE-CAPILLARY: 85 mg/dL (ref 65–99)
Glucose-Capillary: 87 mg/dL (ref 65–99)
Glucose-Capillary: 72 mg/dL (ref 65–99)
Glucose-Capillary: 73 mg/dL (ref 65–99)

## 2016-10-12 LAB — MAGNESIUM: MAGNESIUM: 1.7 mg/dL (ref 1.7–2.4)

## 2016-10-12 LAB — PHOSPHORUS: PHOSPHORUS: 3 mg/dL (ref 2.5–4.6)

## 2016-10-12 MED ORDER — METOPROLOL TARTRATE 12.5 MG HALF TABLET
12.5000 mg | ORAL_TABLET | Freq: Two times a day (BID) | ORAL | Status: DC
  Administered 2016-10-14 (×2): 12.5 mg via ORAL
  Filled 2016-10-12 (×4): qty 1

## 2016-10-12 MED ORDER — HYDRALAZINE HCL 50 MG PO TABS: 50.0000 mg | ORAL_TABLET | Freq: Three times a day (TID) | ORAL | Status: DC

## 2016-10-12 MED ORDER — HYDRALAZINE HCL 20 MG/ML IJ SOLN
10.0000 mg | INTRAMUSCULAR | Status: DC | PRN
  Administered 2016-10-12: 10 mg via INTRAVENOUS
  Filled 2016-10-12: qty 1

## 2016-10-12 MED ORDER — ISOSORBIDE DINITRATE 20 MG PO TABS
20.0000 mg | ORAL_TABLET | Freq: Three times a day (TID) | ORAL | Status: DC
  Administered 2016-10-13 – 2016-10-14 (×3): 20 mg via ORAL
  Filled 2016-10-12 (×12): qty 1

## 2016-10-12 NOTE — Evaluation (Signed)
Speech Language Pathology Evaluation Patient Details Name: [name] MRN: [id number] DOB: 1924-03-07 Today's Date: 10/12/2016 Time: 7846-9629 SLP Time Calculation (min) (ACUTE ONLY): 25 min  Problem List:  Patient Active Problem List   Diagnosis Date Noted  . Stroke (cerebrum) ([place]) 10/11/2016  . Stroke-like symptoms 10/11/2016  . CKD (chronic kidney disease), stage III 10/11/2016  . GERD (gastroesophageal reflux disease) 10/11/2016  . COPD (chronic obstructive pulmonary disease) ([place]) 10/11/2016  . HLD (hyperlipidemia) 10/11/2016  . Hypothyroid 10/11/2016  . Hypertension   . Dysarthria   . Acute CVA (cerebrovascular accident) ([place]) 08/13/2016  . Slurred speech 08/12/2016  . Acute gout due to renal impairment involving right shoulder 06/07/2016  . S/P AKA (above knee amputation) unilateral, left ([place])   . CHF (congestive heart failure) ([place]) 04/18/2016  . Respiratory distress   . Acute on chronic combined systolic and diastolic CHF (congestive heart failure) ([place]) 01/16/2016  . Palliative care encounter   . Altered mental status   . Goals of care, counseling/discussion   . Palliative care by specialist   . Aphasia   . Hemi-neglect of right side   . Cerebral thrombosis with cerebral infarction 12/30/2015  . Cerebrovascular accident (CVA) involving left middle cerebral artery territory [facility])   . Altered mental state 12/29/2015  . Acute right-sided weakness 12/29/2015  . Dysphagia   . FTT (failure to thrive) in adult 11/08/2015  . Nausea & vomiting 11/08/2015  . Metabolic acidosis 52/84/1324  . End stage renal disease ([place]) 10/07/2015  . Hypertensive heart disease with heart failure ([place]) 10/07/2015  . Junctional bradycardia 09/18/2015  . Atypical chest pain 06/23/2014  . Neck pain   . Food impaction of esophagus   . Epiglottitis 04/13/2014  . Wound disruption, post-op, skin 02/05/2014  . Pre-operative cardiovascular examination 12/13/2013  . Foot pain 09/04/2013  .  CKD (chronic kidney disease) stage 4, GFR 15-29 ml/min (HCC) 09/04/2013  . Essential hypertension 08/23/2013  . Malnutrition of moderate degree ([place]) 08/21/2013  . Chronic combined systolic (congestive) and diastolic (congestive) heart failure ([place]) 08/15/2013  . Anemia of chronic disease 08/15/2013  . Hyperkalemia 08/15/2013  . Chronic total occlusion of artery of the extremities ([place]) 02/27/2013  . Dyslipidemia 01/05/2013  . Chronic renal insufficiency, stage IV (severe)- HD started 01/05/13 01/05/2013  . Diabetes mellitus type 2 with peripheral artery disease ([place]) 01/05/2013  . PVD (peripheral vascular disease) ([place]) 10/19/2011  . CAD - moderate at cath July 2012 (medical Rx) 02/08/2011  . 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 Severe hypothyroidism 02/08/2011   Past Medical History:  Past Medical History:  Diagnosis Date  . Abnormal nuclear stress test 06/01/09   Demonstrated a new area of infarct scar, peri-infarct ischemia seen in the inferolateral territory. EF eas normal at 70% with mild hypocontractility at the apex, distal inferolateral wall.  . Anemia   . Arthritis   . Cardiomyopathy, idiopathic ([place]) 02/08/2011  . Chronic diastolic CHF (congestive heart failure) (HCC)    Takes Lasix  . Chronic kidney disease (CKD), stage IV (severe) ([place])   . Coronary artery disease    a. Cath 09/2010 - med rx.  . Diabetes mellitus    type 2 NIDDM  . Dyslipidemia   . GERD (gastroesophageal reflux disease)   . Gout    takes allopurinol  . H/O echocardiogram 09/06/11   Indication- nonIschemic Cardiomyopathy. EF = now greater than 55% with no regional wall motion abnormalities. Tthere is mild to moderate trisuspid regurgitayion  and mild pulmonary hypertension with an RVSP of 35 mmHg as well as stage 1 diastolic dysfunction and mild to moderate LVH.  Marland Kitchen Hx of transient ischemic attack (TIA)   . Hypertension   . Hypothyroidism    (SEVERE) Takes Levothryroxine  .  Irregular heartbeat   . Memory loss   . Myocardial infarct ([place])    x 3 unsure of years  . Nonischemic cardiomyopathy (HCC)    EF now is 55%, reduced due to myxedema, which is improved.  . Peripheral neuropathy   . Peripheral vascular disease ([place])    a. s/p L BKA.  . Shingles   . Stroke ([place])   . TIA (transient ischemic attack)   . Ulcer    Past Surgical History:  Past Surgical History:  Procedure Laterality Date  . AMPUTATION  07/05/2011   Procedure: AMPUTATION BELOW KNEE;  Surgeon: Angelia Mould, MD;  Location: [place];  Service: Vascular;  Laterality: Left;  . ANGIOPLASTY  1988  . AV FISTULA PLACEMENT    . AV FISTULA PLACEMENT Right 12/24/2013   Procedure: INSERTION OF ARTERIOVENOUS (AV) GORE-TEX GRAFT ARM;  Surgeon: Angelia Mould, MD;  Location: [place];  Service: Vascular;  Laterality: Right;  . [place]  09/28/07   Demonstrated multiple sequential lesions around 40 to 30% in the RCA territory.  . Duplex doppler  05/10/11   LE arterial dopplers demonstrate bilaterally reduced ABIs of 0.91 on right & 0.56 on left. She does report some decreased pain on the left, & there's moderate mixed-density plaque in the right SFA w/50 to 69% reduction. There's a 69% reduction in the left SFA & does appear to be occlusive disease of left posterior tibial artery. Right posterior dorsalis pedis artery demonstrates occlusive disease  . ESOPHAGOGASTRODUODENOSCOPY N/A 04/15/2014   Procedure: ESOPHAGOGASTRODUODENOSCOPY (EGD);  Surgeon: Milus Banister, MD;  Location: [place];  Service: Endoscopy;  Laterality: N/A;  . EYE SURGERY     Left eye surgery; cataract removal  . INSERTION OF DIALYSIS CATHETER N/A 01/06/2013   Procedure: INSERTION OF DIALYSIS CATHETER;  Surgeon: Rosetta Posner, MD;  Location: [place];  Service: Vascular;  Laterality: N/A;  . left bka  07/05/2011  . left lower extremity venous duplex Left 06/27/11   Summary: No  evidence of DVT involving the left lower extremity and right common femoral vein.   Marland Kitchen LIGATION ARTERIOVENOUS GORTEX GRAFT Right 03/25/2014   Procedure: LIGATION ARTERIOVENOUS GORTEX GRAFT;  Surgeon: Angelia Mould, MD;  Location: [place];  Service: Vascular;  Laterality: Right;  . LOWER EXTREMITY ANGIOGRAM N/A 10/31/2011   Procedure: LOWER EXTREMITY ANGIOGRAM;  Surgeon: Angelia Mould, MD;  [facility] C[facility] LAB;  Service: Cardiovascular;  Laterality: N/A;  . Lower extremity arterial evaluation  06/27/11   SUMMARY: Right: ABI not ascertained due to false elevation in BP secondary to calcification (posterior tibial artery is non compressible). Left: ABI indicates moderate reduction in arterial flow. Bilateral: Great toe PPG waveforms indicate adequate perfusion. Great toe pressures not obtained due to patient's movements secondary to pain.  Marland Kitchen SHUNTOGRAM N/A 03/17/2014   Procedure: Earney Mallet;  Surgeon: Conrad Carrboro, MD;  Location[facility] C[facility] LAB;  Service: Cardiovascular;  Laterality: N/A;   HPI:  Pt is a 83 y.o. female with PMH of TIA/stroke, peripheral neuropathy, PVD, MI/CAD, GERD, HLD, DM, C KD stage IV, hypothyroidism, hypertension, memory loss. Patient presenting with acute onset confusion and aphasia.  CXR was clear, Head CT showed nothing acute, MRI ordered. EEG showed diffuse cerebral dysfunction nonspecific in etiology.  Pt with hx of dysphagia and cognitive-linguistic deficits with stroke admissions in the past; on most recent admission May 2018 pt had scattered infarcts in L parietal lobe and R frontal lobe. On that admission pt advanced from D1/ thin to D3/ thin. Also hx of esophageal deficits- dismotility and diverticulum (from SLP note 2017). Pt failed RN swallow screen due to hx of dysphagia- bedside swallow and cognitive linguistic eval ordered.    Assessment / Plan / Recommendation Clinical Impression  Pt lethargic during evaluation, so it was difficult to fully assess skills. Pt  presents with cognitive deficits and moderate-severe receptive/ expressive aphasia which are acute-on-chronic. Pt following only 25% of 1-step commands, answered biographical yes/ no questions with 100% accuracy; stated last name and then perseverated on last name for the remainder of evaluation of expressive language. Pt oriented to self/ location when provided questions in a yes/ no format. Difficult to fully assess cognition but pt showed decreased sustained attention to tasks presented, which impacted all other skills. Will continue to follow for diagnostic treatment/ target functional communication skills while pt is in the hospital so that pt can have wants/ needs met and increase safety.    SLP Assessment  SLP Recommendation/Assessment: Patient needs continued Speech Lanaguage Pathology Services SLP Visit Diagnosis: Aphasia (R47.01);Cognitive communication deficit (R41.841)    Follow Up Recommendations  24 hour supervision/assistance    Frequency and Duration min 3x week  2 weeks      SLP Evaluation Cognition  Overall Cognitive Status: Impaired/Different from baseline Arousal/Alertness: Lethargic Orientation Level: Oriented to person;Oriented to place;Disoriented to time;Disoriented to situation Attention: Sustained Sustained Attention: Impaired Sustained Attention Impairment: Verbal basic;Functional basic Behaviors:  (minimal participation)       Comprehension  Auditory Comprehension Overall Auditory Comprehension: Impaired Yes/No Questions: Within Functional Limits Basic Biographical Questions: 76-100% accurate Commands: Impaired One Step Basic Commands: 0-24% accurate    Expression Expression Primary Mode of Expression: Verbal Verbal Expression Overall Verbal Expression: Impaired Automatic Speech: Name Level of Generative/Spontaneous Verbalization: Word Repetition: Impaired Level of Impairment: Word level Naming: Impairment Non-Verbal Means of Communication: Other  (comment) (nodding/ shaking head) Written Expression Dominant Hand: Right   Oral / Motor  Oral Motor/Sensory Function Overall Oral Motor/Sensory Function:  (could not fully assess- not following commands) Motor Speech Overall Motor Speech: Other (comment) (difficult to assess- minimal verbalizations) Phonation: Normal Resonance: Within functional limits   GO          Functional Assessment Tool Used: clinical judgment Functional Limitations: Spoken language expressive Swallow Current Status (X9147): At least 60 percent but less than 80 percent impaired, limited or restricted Swallow Goal Status [phone]): At least 40 percent but less than 60 percent impaired, limited or restricted Spoken Language Expression Current Status [phone]): At least 80 percent but less than 100 percent impaired, limited or restricted Spoken Language Expression Goal Status [phone]): At least 60 percent but less than 80 percent impaired, limited or restricted         Kern Reap, [place], CCC-SLP 10/12/2016, 11:30 AM [phone]

## 2016-10-12 NOTE — Evaluation (Signed)
Occupational Therapy Evaluation and Discharge Patient Details Name: [name] MRN: [id number] DOB: 1924/02/12 Today's Date: 10/12/2016    History of Present Illness [name] is a 83 y.o. female with past history significant for L BKA, anemia, heart failure, chronic kidney disease, coronary artery disease, diabetes, reflux, gout, hypertension, hypothyroidism presents with family with chief complaint of slurred speech and acute CVA.    Clinical Impression   Patient seen for evaluation assessment. Patient known to this service. Son at bedside during assessment. PTA patient was at home with son providing total ADL care for patient. States that she had some occupational therapy in the past but that patient did not appear to measurable gains. Reviewed exercise program that son had been doing with patient. At this time, feel patient will likely d/c home with total assist. In Agreement with Physical Therapy that family could benefit from Aide. If family cannot continue to provide assist, may need to consider placement. Do not feel patient will required further acute care needs based on functional limitations at baseline and total care provided. (use of lift at home for OOB activity). At this time, OT will sign off, thank you for this referral.     Follow Up Recommendations  Supervision/Assistance - 24 hour;SNF    Equipment Recommendations  None recommended by OT (Pt has appropriate DME)    Recommendations for Other Services       Precautions / Restrictions Precautions Precautions: Fall Precaution Comments: L BKA Restrictions Weight Bearing Restrictions: No      Mobility Bed Mobility Overal bed mobility: Needs Assistance Bed Mobility: Rolling;Sidelying to Sit;Sit to Sidelying Rolling: Max assist         General bed mobility comments: session limited to rolling for hygiene and pericare, did note come to upright.  Transfers                 General transfer comment:  need lift equpiment    Balance Overall balance assessment: Needs assistance     Sitting balance - Comments: per son (present at bedside) patient has remained total dependence for all activity and mobility                                   ADL either performed or assessed with clinical judgement   ADL Overall ADL's : At baseline                                       General ADL Comments: total A     Vision Baseline Vision/History: Cataracts       Perception     Praxis      Pertinent Vitals/Pain Pain Assessment: Faces Faces Pain Scale: Hurts little more Pain Location: when moving RLE Pain Descriptors / Indicators: Grimacing Pain Intervention(s): Monitored during session     Hand Dominance Right   Extremity/Trunk Assessment Upper Extremity Assessment Upper Extremity Assessment: Generalized weakness   Lower Extremity Assessment Lower Extremity Assessment: Defer to PT evaluation LLE Deficits / Details: Noted knee flexion contracture ~30   Cervical / Trunk Assessment Cervical / Trunk Assessment: Kyphotic   Communication Communication Communication: HOH   Cognition Arousal/Alertness: Awake/alert Behavior During Therapy: Flat affect Overall Cognitive Status: History of cognitive impairments - at baseline Area of Impairment: Orientation;Memory;Following commands;Safety/judgement;Awareness;Problem solving  Orientation Level: Disoriented to;Time;Person;Situation   Memory: Decreased short-term memory Following Commands: Follows one step commands with increased time Safety/Judgement: Decreased awareness of safety;Decreased awareness of deficits Awareness: Intellectual Problem Solving: Slow processing;Difficulty sequencing;Requires verbal cues;Requires tactile cues General Comments: Patient minimally interactive during session, oriented to self. Recognizes son and appropriate with response to commands despite  increased time to process   General Comments  Per son Pt was receiving OT until about 3 weeks ago with the focus being HEP for BUE strengthening, son did not feel it was very effective for improving care ability.     Exercises     Shoulder Instructions      Home Living Family/patient expects to be discharged to:: Private residence Living Arrangements: Children Available Help at Discharge: Family;Available 24 hours/day Type of Home: House Home Access: Ramped entrance     Home Layout: One level     Bathroom Shower/Tub: Chief Strategy Officer: Standard Bathroom Accessibility: No   Home Equipment: Environmental consultant - 2 wheels;Bedside commode;Tub bench;Hospital bed;Wheelchair - IT trainer;Shower seat   Additional Comments: son reports having hoyer lift but prefer to transfer her personally      Prior Functioning/Environment Level of Independence: Needs assistance  Gait / Transfers Assistance Needed: total lift for bed - w/c transfers ADL's / Homemaking Assistance Needed: total A with all ADL, including feeding            OT Problem List: Decreased strength;Decreased activity tolerance;Impaired balance (sitting and/or standing);Impaired vision/perception;Decreased coordination;Decreased cognition;Decreased safety awareness;Decreased knowledge of use of DME or AE;Decreased knowledge of precautions;Impaired sensation;Impaired UE functional use;Pain      OT Treatment/Interventions:      OT Goals(Current goals can be found in the care plan section) Acute Rehab OT Goals Patient Stated Goal: Home at d/c OT Goal Formulation: With family Time For Goal Achievement: 10/26/16 Potential to Achieve Goals: Fair  OT Frequency:     Barriers to D/C:            Co-evaluation PT/OT/SLP Co-Evaluation/Treatment: Yes Reason for Co-Treatment: Complexity of the patient's impairments (multi-system involvement) PT goals addressed during session: Mobility/safety with  mobility OT goals addressed during session: ADL's and self-care      AM-PAC PT "6 Clicks" Daily Activity     Outcome Measure Help from another person eating meals?: Total Help from another person taking care of personal grooming?: Total Help from another person toileting, which includes using toliet, bedpan, or urinal?: Total Help from another person bathing (including washing, rinsing, drying)?: Total Help from another person to put on and taking off regular upper body clothing?: Total Help from another person to put on and taking off regular lower body clothing?: Total 6 Click Score: 6   End of Session Nurse Communication: Need for lift equipment  Activity Tolerance: Patient tolerated treatment well Patient left: in bed;with call bell/phone within reach;with bed alarm set;with family/visitor present  OT Visit Diagnosis: Muscle weakness (generalized) (M62.81);Low vision, both eyes (H54.2);Other symptoms and signs involving the nervous system (R29.898)                Time: 6195-0932 OT Time Calculation (min): 19 min Charges:  OT General Charges $OT Visit: 1 Procedure OT Evaluation $OT Eval Moderate Complexity: 1 Procedure G-Codes: OT G-codes **NOT FOR INPATIENT CLASS** Functional Assessment Tool Used: AM-PAC 6 Clicks Daily Activity Functional Limitation: Self care Self Care Current Status (I7124): 100 percent impaired, limited or restricted Self Care Goal Status (P8099): 100 percent impaired, limited or restricted Self Care  Discharge Status [phone]): 100 percent impaired, limited or restricted   Sherryl Manges OTR/L [phone]  Evern Bio Arianah Torgeson 10/12/2016, 1:25 PM

## 2016-10-12 NOTE — Progress Notes (Signed)
Stroke Team Progress Note  SUBJECTIVE  Patient is lying comfortably in bed. Her son is at the bedside. He states he's been taking care of her at home and she is almost total care following her prior strokes. He seems realistic but would still like to care for her at home if possible.  OBJECTIVE Most recent Vital Signs: Temp: 97.2 F (36.2 C) (07/25 1415) Temp Source: Axillary (07/25 1415) BP: 189/91 (07/25 1415) Pulse Rate: 68 (07/25 1415) Respiratory Rate: 14 O2 Saturdation: 100%  CBG (last 3)   Recent Labs  10/12/16 0815 10/12/16 1226 10/12/16 1730  GLUCAP 79 85 72    Diet: Diet NPO time specified Except for: Other (See Comments)    Activity: Bedrest  VTE Prophylaxis:  Subcutaneous heparin  Studies: Results for orders placed or performed during the hospital encounter of 10/11/16 (from the past 24 hour(s))  Glucose, capillary     Status: Abnormal   Collection Time: 10/11/16  8:15 PM  Result Value Ref Range   Glucose-Capillary 130 (H) 65 - 99 mg/dL   Comment 1 Notify RN    Comment 2 Document in Chart   Glucose, capillary     Status: Abnormal   Collection Time: 10/11/16 11:47 PM  Result Value Ref Range   Glucose-Capillary 102 (H) 65 - 99 mg/dL   Comment 1 Notify RN    Comment 2 Document in Chart   Glucose, capillary     Status: None   Collection Time: 10/12/16  4:08 AM  Result Value Ref Range   Glucose-Capillary 87 65 - 99 mg/dL   Comment 1 Notify RN    Comment 2 Document in Chart   Lipid panel     Status: Abnormal   Collection Time: 10/12/16  6:27 AM  Result Value Ref Range   Cholesterol 174 0 - 200 mg/dL   Triglycerides 409 <811 mg/dL   HDL 47 >91 mg/dL   Total CHOL/HDL Ratio 3.7 RATIO   VLDL 21 0 - 40 mg/dL   LDL Cholesterol 478 (H) 0 - 99 mg/dL  CBC     Status: Abnormal   Collection Time: 10/12/16  6:27 AM  Result Value Ref Range   WBC 5.1 4.0 - 10.5 K/uL   RBC 3.56 (L) 3.87 - 5.11 MIL/uL   Hemoglobin 10.0 (L) 12.0 - 15.0 g/dL   HCT 29.5 (L) 62.1 -  46.0 %   MCV 89.9 78.0 - 100.0 fL   MCH 28.1 26.0 - 34.0 pg   MCHC 31.3 30.0 - 36.0 g/dL   RDW 30.8 65.7 - 84.6 %   Platelets 287 150 - 400 K/uL  Basic metabolic panel     Status: Abnormal   Collection Time: 10/12/16  6:27 AM  Result Value Ref Range   Sodium 143 135 - 145 mmol/L   Potassium 3.8 3.5 - 5.1 mmol/L   Chloride 114 (H) 101 - 111 mmol/L   CO2 20 (L) 22 - 32 mmol/L   Glucose, Bld 89 65 - 99 mg/dL   BUN 17 6 - 20 mg/dL   Creatinine, Ser 9.62 (H) 0.44 - 1.00 mg/dL   Calcium 8.8 (L) 8.9 - 10.3 mg/dL   GFR calc non Af Amer 26 (L) >60 mL/min   GFR calc Af Amer 30 (L) >60 mL/min   Anion gap 9 5 - 15  Magnesium     Status: None   Collection Time: 10/12/16  6:27 AM  Result Value Ref Range   Magnesium 1.7 1.7 - 2.4  mg/dL  Phosphorus     Status: None   Collection Time: 10/12/16  6:27 AM  Result Value Ref Range   Phosphorus 3.0 2.5 - 4.6 mg/dL  Glucose, capillary     Status: None   Collection Time: 10/12/16  8:15 AM  Result Value Ref Range   Glucose-Capillary 79 65 - 99 mg/dL  Glucose, capillary     Status: None   Collection Time: 10/12/16 12:26 PM  Result Value Ref Range   Glucose-Capillary 85 65 - 99 mg/dL  Glucose, capillary     Status: None   Collection Time: 10/12/16  5:30 PM  Result Value Ref Range   Glucose-Capillary 72 65 - 99 mg/dL     Ct Head Wo Contrast  Result Date: 10/11/2016 CLINICAL DATA:  Aphasia. EXAM: CT HEAD WITHOUT CONTRAST TECHNIQUE: Contiguous axial images were obtained from the base of the skull through the vertex without intravenous contrast. COMPARISON:  MRI and CT 08/12/2016 FINDINGS: Brain: severe chronic small vessel disease throughout the deep white matter and diffuse cerebral atrophy. Low-density in the posterior parietal lobes bilaterally compatible with chronic infarct. Small old lacunar infarct in the left thalamus no acute infarction or hemorrhage. Ventriculomegaly is stable related to ex vacuo dilatation. Vascular: No hyperdense vessel or  unexpected calcification. Skull: No acute calvarial abnormality. Sinuses/Orbits: Visualized paranasal sinuses and mastoids clear. Orbital soft tissues unremarkable. Other: None IMPRESSION: Advanced atrophy and chronic small vessel disease changes. Ex vacuo dilatation of the ventricles. Chronic bilateral parietal infarcts and left thalamic lacunar infarct. No acute intracranial abnormality. Electronically Signed   By: Charlett Nose M.D.   On: 10/11/2016 11:07   Mr Brain Wo Contrast  Result Date: 10/12/2016 CLINICAL DATA:  Worsened mental status. EXAM: MRI HEAD WITHOUT CONTRAST TECHNIQUE: Multiplanar, multiecho pulse sequences of the brain and surrounding structures were obtained without intravenous contrast. COMPARISON:  CT 10/11/2016.  MRI 08/12/2016. FINDINGS: Brain: The study suffers from motion degradation. There is a new confluent region of acute infarction in the left parietal lobe measuring approximately 4.6 cm in size consistent with left MCA branch vessel infarction. This is adjacent to the region of infarction seen in May. No other acute infarction. There are a few old small vessel cerebellar infarctions. There are old small vessel infarctions affecting the thalami I and basal ganglia. There chronic small-vessel ischemic changes throughout the cerebral hemispheric white matter. There are old right and left parietal infarctions. The ventricles are chronically enlarged due to central atrophy. No extra-axial collection. No sign of mass lesion. Vascular: Major vessels at the base of the brain show flow. Skull and upper cervical spine: Negative Sinuses/Orbits: Clear/normal Other: None IMPRESSION: 4.6 cm region of confluent acute infarction in the left parietal lobe consistent with left MCA branch vessel infarction. This is adjacent to regions of infarction there were acute in May of this year. Old bilateral parietal infarctions. Extensive chronic small-vessel ischemic changes throughout the brain. Brain  atrophy and ex vacuo enlargement of the lateral ventricles. Electronically Signed   By: Paulina Fusi M.D.   On: 10/12/2016 10:11   Dg Chest Port 1 View  Result Date: 10/11/2016 CLINICAL DATA:  Altered mental status. EXAM: PORTABLE CHEST 1 VIEW COMPARISON:  Chest x-ray dated Aug 12, 2016. FINDINGS: The cardiomediastinal silhouette is mildly enlarged, unchanged. Atherosclerotic calcification of the aortic arch. Normal pulmonary vascularity. Mild bibasilar atelectasis. No focal consolidation, pleural effusion, or pneumothorax. Osteopenia. No acute osseous abnormality. IMPRESSION: 1.  No active cardiopulmonary disease. 2.  Aortic Atherosclerosis (ICD10-I70.0). Electronically  Signed   By: Obie Dredge M.D.   On: 10/11/2016 11:18  EEG shows mild diffuse generalized slowing. No epileptiform activity.  Physical Exam:  . Frail cachectic elderly African-American lady. . Afebrile. Head is nontraumatic. Neck is supple without bruit.    Cardiac exam no murmur or gallop. Lungs are clear to auscultation. Distal pulses are well felt.  Neurological Exam ; Awake alert disoriented. Globally mute. Speaks only occasional words. Not following commands. Can mimic and follow few midline gestures. Extraocular moments of full range. Blinks to threat bilaterally. Fundi could not be visualized. Face is symmetric without weakness. Tongue is midline. Motor system exam most all 4 extremities against gravity. Left below-knee amputation. Deep tendon reflexes are depressed. Right plantar downgoing left cannot be tested. Gait not tested.  ASSESSMENT [name] is a 83 y.o. female with  prior history of left MCA infarct with aphasia and right hemiplegia presents with altered mental status secondary to recurrent left parietal MCA infarct adjacent to the prior stroke. Multiple vascular risk factors of diabetes, hypertension, hyperlipidemia, age and cerebrovascular disease.  Hospital day # 0  TREATMENT/PLAN  Patient has a  poor neurological baseline from prior strokes and now with recurrent stroke I expect her condition declined further. I had a long discussion with the patient's son who appears quite realistic. He is clear that he patient would not want nursing home placement, PEG tube feeding tube. Patient has not passed following eval today. Recommend wait for 24 hours and check eval tomorrow. Palliative care consult would be appropriate at current time. The son will make a decision over the next day or 2 about palliative care and comfort care which I think would be appropriate. Continue aspirin for stroke prevention and pravastatin for lipids and no need for further stroke evaluation. Greater than 50% time during this 25 minute visit was spent on counseling and coordination of care about her stroke discussion about her prognosis and need for palliative care and comfort care discussion  Delia Heady, MD [facility] Pager: [phone] 10/12/2016 5:49 PM

## 2016-10-12 NOTE — Evaluation (Signed)
Clinical/Bedside Swallow Evaluation Patient Details  Name: [name] MRN: [id number] Date of Birth: 1923/04/24  Today's Date: 10/12/2016 Time: SLP Start Time (ACUTE ONLY): 1050 SLP Stop Time (ACUTE ONLY): 1115 SLP Time Calculation (min) (ACUTE ONLY): 25 min  Past Medical History:  Past Medical History:  Diagnosis Date  . Abnormal nuclear stress test 06/01/09   Demonstrated a new area of infarct scar, peri-infarct ischemia seen in the inferolateral territory. EF eas normal at 70% with mild hypocontractility at the apex, distal inferolateral wall.  . Anemia   . Arthritis   . Cardiomyopathy, idiopathic (HCC) 02/08/2011  . Chronic diastolic CHF (congestive heart failure) (HCC)    Takes Lasix  . Chronic kidney disease (CKD), stage IV (severe) (HCC)   . Coronary artery disease    a. Cath 09/2010 - med rx.  . Diabetes mellitus    type 2 NIDDM  . Dyslipidemia   . GERD (gastroesophageal reflux disease)   . Gout    takes allopurinol  . H/O echocardiogram 09/06/11   Indication- nonIschemic Cardiomyopathy. EF = now greater than 55% with no regional wall motion abnormalities. Tthere is mild to moderate trisuspid regurgitayion and mild pulmonary hypertension with an RVSP of 35 mmHg as well as stage 1 diastolic dysfunction and mild to moderate LVH.  Marland Kitchen Hx of transient ischemic attack (TIA)   . Hypertension   . Hypothyroidism    (SEVERE) Takes Levothryroxine  . Irregular heartbeat   . Memory loss   . Myocardial infarct (HCC)    x 3 unsure of years  . Nonischemic cardiomyopathy (HCC)    EF now is 55%, reduced due to myxedema, which is improved.  . Peripheral neuropathy   . Peripheral vascular disease (HCC)    a. s/p L BKA.  . Shingles   . Stroke (HCC)   . TIA (transient ischemic attack)   . Ulcer    Past Surgical History:  Past Surgical History:  Procedure Laterality Date  . AMPUTATION  07/05/2011   Procedure: AMPUTATION BELOW KNEE;  Surgeon: Chuck Hint, MD;  Location: [facility]  OR;  Service: Vascular;  Laterality: Left;  . ANGIOPLASTY  1988  . AV FISTULA PLACEMENT    . AV FISTULA PLACEMENT Right 12/24/2013   Procedure: INSERTION OF ARTERIOVENOUS (AV) GORE-TEX GRAFT ARM;  Surgeon: Chuck Hint, MD;  Location: [facility] OR;  Service: Vascular;  Laterality: Right;  . BACK SURGERY     [facility]  . CARDIAC CATHETERIZATION  09/28/07   Demonstrated multiple sequential lesions around 40 to 30% in the RCA territory.  . Duplex doppler  05/10/11   LE arterial dopplers demonstrate bilaterally reduced ABIs of 0.91 on right & 0.56 on left. She does report some decreased pain on the left, & there's moderate mixed-density plaque in the right SFA w/50 to 69% reduction. There's a 69% reduction in the left SFA & does appear to be occlusive disease of left posterior tibial artery. Right posterior dorsalis pedis artery demonstrates occlusive disease  . ESOPHAGOGASTRODUODENOSCOPY N/A 04/15/2014   Procedure: ESOPHAGOGASTRODUODENOSCOPY (EGD);  Surgeon: Rachael Fee, MD;  Location[facility] ENDOSCOPY;  Service: Endoscopy;  Laterality: N/A;  . EYE SURGERY     Left eye surgery; cataract removal  . INSERTION OF DIALYSIS CATHETER N/A 01/06/2013   Procedure: INSERTION OF DIALYSIS CATHETER;  Surgeon: Larina Earthly, MD;  Location: [facility] OR;  Service: Vascular;  Laterality: N/A;  . left bka  07/05/2011  . left lower extremity venous duplex Left 06/27/11  Summary: No evidence of DVT involving the left lower extremity and right common femoral vein.   Marland Kitchen LIGATION ARTERIOVENOUS GORTEX GRAFT Right 03/25/2014   Procedure: LIGATION ARTERIOVENOUS GORTEX GRAFT;  Surgeon: Chuck Hint, MD;  Location: [facility] OR;  Service: Vascular;  Laterality: Right;  . LOWER EXTREMITY ANGIOGRAM N/A 10/31/2011   Procedure: LOWER EXTREMITY ANGIOGRAM;  Surgeon: Chuck Hint, MD;  Location: [facility] CATH LAB;  Service: Cardiovascular;  Laterality: N/A;  . Lower extremity arterial evaluation  06/27/11   SUMMARY: Right: ABI  not ascertained due to false elevation in BP secondary to calcification (posterior tibial artery is non compressible). Left: ABI indicates moderate reduction in arterial flow. Bilateral: Great toe PPG waveforms indicate adequate perfusion. Great toe pressures not obtained due to patient's movements secondary to pain.  Marland Kitchen SHUNTOGRAM N/A 03/17/2014   Procedure: Betsey Amen;  Surgeon: Fransisco Hertz, MD;  Location: [facility] CATH LAB;  Service: Cardiovascular;  Laterality: N/A;   HPI:  Pt is a 83 y.o. female with PMH of TIA/stroke, peripheral neuropathy, PVD, MI/CAD, GERD, HLD, DM, C KD stage IV, hypothyroidism, hypertension, memory loss. Patient presenting with acute onset confusion and aphasia. CXR was clear, Head CT showed nothing acute, MRI ordered. EEG showed diffuse cerebral dysfunction nonspecific in etiology.  Pt with hx of dysphagia and cognitive-linguistic deficits with stroke admissions in the past; on most recent admission May 2018 pt had scattered infarcts in L parietal lobe and R frontal lobe. On that admission pt advanced from D1/ thin to D3/ thin. Also hx of esophageal deficits- dismotility and diverticulum (from SLP note 2017). Pt failed RN swallow screen due to hx of dysphagia- bedside swallow and cognitive linguistic eval ordered.    Assessment / Plan / Recommendation Clinical Impression  Bedside swallow evaluation was very limited due to pt declining PO trials. Pt did take several sips of thin liquid without overt s/s of aspiration; when offered again, pt refused and declined all solid consistencies with multiple attempts and various presentations/ choices offered. Pt currently lethargic, minimally opening eyes, not following 1-step commands despite maximal cues. Plan to reattempt PO trials this afternoon to further assess. Recommend continuing NPO status for now, allowing small sips of thin liquids if pt shows desire for intake. Will f/u this afternoon.  SLP Visit Diagnosis: Dysphagia, unspecified  (R13.10)    Aspiration Risk  Moderate aspiration risk    Diet Recommendation Free water protocol after oral care   Liquid Administration via: Straw Medication Administration: Via alternative means Supervision: Full supervision/cueing for compensatory strategies Compensations: Small sips/bites Postural Changes: Seated upright at 90 degrees    Other  Recommendations Oral Care Recommendations: Oral care prior to ice chip/H20 Other Recommendations: Clarify dietary restrictions   Follow up Recommendations 24 hour supervision/assistance      Frequency and Duration            Prognosis Prognosis for Safe Diet Advancement: Good Barriers to Reach Goals: Cognitive deficits      Swallow Study   General HPI: Pt is a 83 y.o. female with PMH of TIA/stroke, peripheral neuropathy, PVD, MI/CAD, GERD, HLD, DM, C KD stage IV, hypothyroidism, hypertension, memory loss. Patient presenting with acute onset confusion and aphasia. CXR was clear, Head CT showed nothing acute, MRI ordered. EEG showed diffuse cerebral dysfunction nonspecific in etiology.  Pt with hx of dysphagia and cognitive-linguistic deficits with stroke admissions in the past; on most recent admission May 2018 pt had scattered infarcts in L parietal lobe and R frontal lobe. On  that admission pt advanced from D1/ thin to D3/ thin. Also hx of esophageal deficits- dismotility and diverticulum (from SLP note 2017). Pt failed RN swallow screen due to hx of dysphagia- bedside swallow and cognitive linguistic eval ordered.  Type of Study: Bedside Swallow Evaluation Previous Swallow Assessment: see HPI Diet Prior to this Study: NPO Temperature Spikes Noted: No Respiratory Status: Room air History of Recent Intubation: No Behavior/Cognition: Lethargic/Drowsy;Doesn't follow directions Oral Cavity Assessment: Within Functional Limits Oral Cavity - Dentition: Edentulous Vision: Impaired for self-feeding Self-Feeding Abilities: Total  assist Patient Positioning: Upright in bed Baseline Vocal Quality: Normal Volitional Cough: Cognitively unable to elicit Volitional Swallow: Unable to elicit    Oral/Motor/Sensory Function Overall Oral Motor/Sensory Function:  (could not fully assess- not following commands)   Ice Chips Ice chips: Not tested   Thin Liquid Thin Liquid: Within functional limits Presentation: Straw    Nectar Thick Nectar Thick Liquid: Not tested   Honey Thick Honey Thick Liquid: Not tested   Puree Puree: Not tested Other Comments: refused   Solid   GO   Solid: Not tested Other Comments: refused    Functional Assessment Tool Used: clinical judgment Functional Limitations: Swallowing Swallow Current Status (P8099): At least 60 percent but less than 80 percent impaired, limited or restricted Swallow Goal Status [phone]): At least 40 percent but less than 60 percent impaired, limited or restricted   Metro Kung, MA, CCC-SLP 10/12/2016,11:20 AM [phone]

## 2016-10-12 NOTE — Progress Notes (Signed)
[facility]  Patient Status: Active (receiving services up to time of hospitalization)  [facility] is providing the following services: RN  If patient discharges after hours, please call [phone].   [name] 10/12/2016, 10:43 AM

## 2016-10-12 NOTE — Progress Notes (Signed)
  Speech Language Pathology Treatment: Dysphagia  Patient Details Name: KIERAH FRUTOS MRN: [id number] DOB: [birth year] Today's Date: 10/12/2016 Time: 0712-1975 SLP Time Calculation (min) (ACUTE ONLY): 10 min  Assessment / Plan / Recommendation Clinical Impression  Dysphagia treatment provided to reattempt PO trials. Unfortunately the pt was sleeping upon entering the room and remained lethargic for PO trial attempts. Pt consumed 2 small bites of ice cream without overt s/s of aspiration after given max cues for encouragement; otherwise refused all other POs and needed maximal cues to maintain alertness for attempts. Recommend continuing NPO status, but continue to allow small sips of thin liquid if pt is alert and wanting to drink, with supervision to cue small sips. Pt's cognitive status puts pt at increased risk of aspiration. Will f/u next date for PO trials.   HPI HPI: Pt is a 83 y.o. female with PMH of TIA/stroke, peripheral neuropathy, PVD, MI/CAD, GERD, HLD, DM, C KD stage IV, hypothyroidism, hypertension, memory loss. Patient presenting with acute onset confusion and aphasia. CXR was clear, Head CT showed nothing acute, MRI ordered. EEG showed diffuse cerebral dysfunction nonspecific in etiology.  Pt with hx of dysphagia and cognitive-linguistic deficits with stroke admissions in the past; on most recent admission May 2018 pt had scattered infarcts in L parietal lobe and R frontal lobe. On that admission pt advanced from D1/ thin to D3/ thin. Also hx of esophageal deficits- dismotility and diverticulum (from SLP note 2017). Pt failed RN swallow screen due to hx of dysphagia- bedside swallow and cognitive linguistic eval ordered.       SLP Plan  Continue with current plan of care  Patient needs continued Speech Lanaguage Pathology Services    Recommendations  Diet recommendations: NPO (small sips thin liquid with supervision) Liquids provided via: Straw Medication Administration: Via  alternative means Compensations: Small sips/bites Postural Changes and/or Swallow Maneuvers: Seated upright 90 degrees                Oral Care Recommendations: Oral care QID;Oral care prior to ice chip/H20 Follow up Recommendations: 24 hour supervision/assistance SLP Visit Diagnosis: Dysphagia, unspecified (R13.10) Plan: Continue with current plan of care       GO           Functional Assessment Tool Used: clinical judgment Functional Limitations: Swallowing Swallow Current Status (O8325): At least 60 percent but less than 80 percent impaired, limited or restricted Swallow Goal Status [phone]): At least 40 percent but less than 60 percent impaired, limited or restricted Spoken Language Expression Current Status [phone]): At least 80 percent but less than 100 percent impaired, limited or restricted Spoken Language Expression Goal Status [phone]): At least 60 percent but less than 80 percent impaired, limited or restricted    Metro Kung, MA, CCC-SLP 10/12/2016, 2:10 PM [phone]

## 2016-10-12 NOTE — Progress Notes (Signed)
SLP Cancellation Note  Patient Details Name: [name] MRN: [id number] DOB: 1923/08/21   Cancelled treatment:       Reason Eval/Treat Not Completed: Patient at procedure or test/unavailable. Will reattempt later this date for bedside swallow eval and cognitive-linguistic evaluation.  Emalyn Schou Cecille Aver, MA, CCC-SLP 10/12/2016, 8:42 AM [phone]

## 2016-10-12 NOTE — Progress Notes (Signed)
PROGRESS NOTE    [name]  ZOX:[id number] DOB: June 17, 1923 DOA: 10/11/2016 PCP: Georgianne Fick, MD   Outpatient Specialists:     Brief Narrative:  [name] is a 83 y.o. female with medical history significant of TIA/stroke, peripheral neuropathy, PVD, MI/CAD, GERD, HLD, DM, C KD stage IV, hypothyroidism, hypertension, memory loss. Patient presenting with acute onset confusion and aphasia. Patient was in her normal state of health until approximately 08:30 on day of admission. Family reports the patient was eating breakfast when she suddenly became confused. She is acting as if she did not know what certain things were that were commented to her and would not follow commands. Patient was unaware when her birth date was. Patient would simply moan in response to questions.    Assessment & Plan:   Active Problems:   Stroke (cerebrum) (HCC)   Stroke-like symptoms   CKD (chronic kidney disease), stage III   GERD (gastroesophageal reflux disease)   COPD (chronic obstructive pulmonary disease) (HCC)   HLD (hyperlipidemia)   Hypothyroid   Hypertension   Dysarthria   CVA -MRI shows: 4.6 cm region of confluent acute infarction in the left parietal lobe consistent with left MCA branch vessel infarction. This is adjacent to regions of infarction there were acute in May of this year. -when patient was here in May, she was found to have a CVA but work up was not done as patient was made comfort care and left with hospice.  Family has at home and she has improved - EEG: This awake and drowsy EEG is abnormal due to moderate diffuse slowing of the waking background.  - LDL: 106- on pravastatin - Neuro checks - Allow permissive HTN -echo/carotid ordered -HgbA1C pending  HTN: - allow permissive HTN  - resume isordil, metop, lasix, hydralazine  - Hydralazine PRN BP >210/110  R foot digit ulcerations: Pt w/ ongoing outpt vascualar care. No evidence of acute worsening of  chronic wounds necessitating emergent workup. Pt w/ PAD and is s/op L BKA.  - continue outpt vascular workup - Space boot.   Chronic diastolic and systolic congestive heart failure: Last echo showing an EF of 20% and grade 2 diastolic dysfunction. No evidence of due to conversation. -Patient hold Lasix  - Strict I's and O's, daily weights  CKD:Cr 2.0./ at baseline - continue renal vitamins  COPD: - continue combivent  Hypothyroid: - continue synthroid  GERD: - continue pepcid  HLD: - continue pravastatin   DVT prophylaxis:  SQ Heparin  Code Status: DNR   Family Communication: Son at bedside-- he is her caregiver  Disposition Plan:     Consultants:   neuro   Subjective: In bed, will say few words only  Objective: Vitals:   10/12/16 0203 10/12/16 0213 10/12/16 0400 10/12/16 0956  BP:  (!) 184/66 (!) 177/56 (!) 190/62  Pulse:  (!) 57 (!) 54 67  Resp:    14  Temp:  (!) 97.4 F (36.3 C) 97.8 F (36.6 C)   TempSrc:  Axillary Axillary   SpO2:  100% 100% 99%  Weight: 47.1 kg (103 lb 12.8 oz)       Intake/Output Summary (Last 24 hours) at 10/12/16 1407 Last data filed at 10/12/16 1200  Gross per 24 hour  Intake           458.57 ml  Output                0 ml  Net  458.57 ml   Filed Weights   10/11/16 1019 10/12/16 0203  Weight: 50.8 kg (112 lb) 47.1 kg (103 lb 12.8 oz)    Examination:  General exam: In bed, younger than stated age Respiratory system: Clear to auscultation. Respiratory effort normal. Cardiovascular system: S1 & S2 heard, RRR. No JVD, murmurs, rubs, gallops or clicks. No pedal edema. Gastrointestinal system: Abdomen is nondistended, soft and nontender. No organomegaly or masses felt. Normal bowel sounds heard. Central nervous system: Alert and oriented. No focal neurological deficits. Extremities: left BKA, right 3rd 4th and 5th digits dark Psychiatry: not cooperative for exam    Data Reviewed: I have personally  reviewed following labs and imaging studies  CBC:  Recent Labs Lab 10/11/16 1031 10/12/16 0627  WBC 5.5 5.1  NEUTROABS 2.8  --   HGB 10.9* 10.0*  HCT 34.7* 32.0*  MCV 90.6 89.9  PLT 361 287   Basic Metabolic Panel:  Recent Labs Lab 10/11/16 1031 10/12/16 0627  NA 140 143  K 3.8 3.8  CL 109 114*  CO2 21* 20*  GLUCOSE 138* 89  BUN 20 17  CREATININE 2.08* 1.68*  CALCIUM 8.9 8.8*  MG  --  1.7  PHOS  --  3.0   GFR: Estimated Creatinine Clearance: 14.5 mL/min (A) (by C-G formula based on SCr of 1.68 mg/dL (H)). Liver Function Tests:  Recent Labs Lab 10/11/16 1031  AST 26  ALT 10*  ALKPHOS 71  BILITOT 0.7  PROT 5.9*  ALBUMIN 3.2*   No results for input(s): LIPASE, AMYLASE in the last 168 hours. No results for input(s): AMMONIA in the last 168 hours. Coagulation Profile: No results for input(s): INR, PROTIME in the last 168 hours. Cardiac Enzymes: No results for input(s): CKTOTAL, CKMB, CKMBINDEX, TROPONINI in the last 168 hours. BNP (last 3 results) No results for input(s): PROBNP in the last 8760 hours. HbA1C: No results for input(s): HGBA1C in the last 72 hours. CBG:  Recent Labs Lab 10/11/16 2015 10/11/16 2347 10/12/16 0408 10/12/16 0815 10/12/16 1226  GLUCAP 130* 102* 87 79 85   Lipid Profile:  Recent Labs  10/12/16 0627  CHOL 174  HDL 47  LDLCALC 106*  TRIG 106  CHOLHDL 3.7   Thyroid Function Tests: No results for input(s): TSH, T4TOTAL, FREET4, T3FREE, THYROIDAB in the last 72 hours. Anemia Panel: No results for input(s): VITAMINB12, FOLATE, FERRITIN, TIBC, IRON, RETICCTPCT in the last 72 hours. Urine analysis:    Component Value Date/Time   COLORURINE YELLOW 10/11/2016 1115   APPEARANCEUR CLEAR 10/11/2016 1115   LABSPEC 1.014 10/11/2016 1115   PHURINE 5.0 10/11/2016 1115   GLUCOSEU NEGATIVE 10/11/2016 1115   HGBUR NEGATIVE 10/11/2016 1115   BILIRUBINUR NEGATIVE 10/11/2016 1115   KETONESUR NEGATIVE 10/11/2016 1115   PROTEINUR  100 (A) 10/11/2016 1115   UROBILINOGEN 0.2 08/18/2013 1807   NITRITE NEGATIVE 10/11/2016 1115   LEUKOCYTESUR NEGATIVE 10/11/2016 1115     )No results found for this or any previous visit (from the past 240 hour(s)).    Anti-infectives    None       Radiology Studies: Ct Head Wo Contrast  Result Date: 10/11/2016 CLINICAL DATA:  Aphasia. EXAM: CT HEAD WITHOUT CONTRAST TECHNIQUE: Contiguous axial images were obtained from the base of the skull through the vertex without intravenous contrast. COMPARISON:  MRI and CT 08/12/2016 FINDINGS: Brain: severe chronic small vessel disease throughout the deep white matter and diffuse cerebral atrophy. Low-density in the posterior parietal lobes bilaterally compatible with chronic  infarct. Small old lacunar infarct in the left thalamus no acute infarction or hemorrhage. Ventriculomegaly is stable related to ex vacuo dilatation. Vascular: No hyperdense vessel or unexpected calcification. Skull: No acute calvarial abnormality. Sinuses/Orbits: Visualized paranasal sinuses and mastoids clear. Orbital soft tissues unremarkable. Other: None IMPRESSION: Advanced atrophy and chronic small vessel disease changes. Ex vacuo dilatation of the ventricles. Chronic bilateral parietal infarcts and left thalamic lacunar infarct. No acute intracranial abnormality. Electronically Signed   By: Charlett Nose M.D.   On: 10/11/2016 11:07   Mr Brain Wo Contrast  Result Date: 10/12/2016 CLINICAL DATA:  Worsened mental status. EXAM: MRI HEAD WITHOUT CONTRAST TECHNIQUE: Multiplanar, multiecho pulse sequences of the brain and surrounding structures were obtained without intravenous contrast. COMPARISON:  CT 10/11/2016.  MRI 08/12/2016. FINDINGS: Brain: The study suffers from motion degradation. There is a new confluent region of acute infarction in the left parietal lobe measuring approximately 4.6 cm in size consistent with left MCA branch vessel infarction. This is adjacent to the  region of infarction seen in May. No other acute infarction. There are a few old small vessel cerebellar infarctions. There are old small vessel infarctions affecting the thalami I and basal ganglia. There chronic small-vessel ischemic changes throughout the cerebral hemispheric white matter. There are old right and left parietal infarctions. The ventricles are chronically enlarged due to central atrophy. No extra-axial collection. No sign of mass lesion. Vascular: Major vessels at the base of the brain show flow. Skull and upper cervical spine: Negative Sinuses/Orbits: Clear/normal Other: None IMPRESSION: 4.6 cm region of confluent acute infarction in the left parietal lobe consistent with left MCA branch vessel infarction. This is adjacent to regions of infarction there were acute in May of this year. Old bilateral parietal infarctions. Extensive chronic small-vessel ischemic changes throughout the brain. Brain atrophy and ex vacuo enlargement of the lateral ventricles. Electronically Signed   By: Paulina Fusi M.D.   On: 10/12/2016 10:11   Dg Chest Port 1 View  Result Date: 10/11/2016 CLINICAL DATA:  Altered mental status. EXAM: PORTABLE CHEST 1 VIEW COMPARISON:  Chest x-ray dated Aug 12, 2016. FINDINGS: The cardiomediastinal silhouette is mildly enlarged, unchanged. Atherosclerotic calcification of the aortic arch. Normal pulmonary vascularity. Mild bibasilar atelectasis. No focal consolidation, pleural effusion, or pneumothorax. Osteopenia. No acute osseous abnormality. IMPRESSION: 1.  No active cardiopulmonary disease. 2.  Aortic Atherosclerosis (ICD10-I70.0). Electronically Signed   By: Obie Dredge M.D.   On: 10/11/2016 11:18        Scheduled Meds: . aspirin  300 mg Rectal Daily   Or  . aspirin EC  325 mg Oral Daily  . calcium-vitamin D  1 tablet Oral Daily  . heparin  5,000 Units Subcutaneous Q8H  . levothyroxine  100 mcg Oral QAC breakfast  . potassium chloride  20 mEq Oral Daily  .  pravastatin  40 mg Oral QPM  . sevelamer carbonate  0.8 g Oral TID WC  . sodium bicarbonate  650 mg Oral Daily   Continuous Infusions: . sodium chloride 50 mL/hr at 10/12/16 1146  . levETIRAcetam Stopped (10/12/16 1155)     LOS: 0 days    Time spent: 35 min    Swati Granberry [place], DO [facility] Pager [phone]  If 7PM-7AM, please contact night-coverage [email or web address] Password [email or web address] 10/12/2016, 2:07 PM

## 2016-10-12 NOTE — Evaluation (Signed)
Physical Therapy Evaluation Patient Details Name: [name] MRN: [id number] DOB: 04-14-1923 Today's Date: 10/12/2016   History of Present Illness  [name] is a 83 y.o. female with past history significant for L BKA, anemia, heart failure, chronic kidney disease, coronary artery disease, diabetes, reflux, gout, hypertension, hypothyroidism presents with family with chief complaint of slurred speech and acute CVA.   Clinical Impression  Patient seen for evaluation assessment. Patient known to this service. Son at bedside during assessment. PTA patient was at home with son providing total care for patient. States that she had some therapy in the past but that patient did not appear to benefit  Or make measurable gains. Reviewed exercise program that son had been doing with patient. At this time, feel patient will likely d/c home with total assist. Family could benefit from [place]. If family cannot continue to provide assist, may need to consider placement. Do not feel patient will required further acute care needs based on functional limitations at baseline and total care provided. (use of lift at home for OOB activity). At this time, will sign off.     Follow Up Recommendations Supervision/Assistance - 24 hour (son providing 24/7 total assist, could benefit from Aide )    Equipment Recommendations  None recommended by PT    Recommendations for Other Services       Precautions / Restrictions Precautions Precautions: Fall Precaution Comments: L BKA Restrictions Weight Bearing Restrictions: No      Mobility  Bed Mobility Overal bed mobility: Needs Assistance Bed Mobility: Rolling;Sidelying to Sit;Sit to Sidelying Rolling: Max assist         General bed mobility comments: session limited to rolling for hygiene and pericare, did note come to upright.  Transfers                 General transfer comment: need lift equpiment  Ambulation/Gait              General Gait Details: non ambulatory  Stairs            Wheelchair Mobility    Modified Rankin (Stroke Patients Only) Modified Rankin (Stroke Patients Only) Pre-Morbid Rankin Score: Severe disability Modified Rankin: Severe disability     Balance Overall balance assessment: Needs assistance     Sitting balance - Comments: per son (present at bedside) patient has remained total dependence for all activity and mobility                                     Pertinent Vitals/Pain Pain Assessment: Faces Faces Pain Scale: No hurt Pain Location: when moving RLE Pain Descriptors / Indicators: Grimacing Pain Intervention(s): Monitored during session    Home Living Family/patient expects to be discharged to:: Private residence Living Arrangements: Children Available Help at Discharge: Family;Available 24 hours/day Type of Home: House Home Access: Ramped entrance     Home Layout: One level Home Equipment: Walker - 2 wheels;Bedside commode;Tub bench;Hospital bed;Wheelchair - Press photographer;Shower seat Additional Comments: son reports having hoyer lift but prefer to transfer her personally    Prior Function Level of Independence: Needs assistance   Gait / Transfers Assistance Needed: total lift for bed - w/c transfers  ADL's / Homemaking Assistance Needed: total A with all ADL, including feeding        Hand Dominance   Dominant Hand: Right    Extremity/Trunk Assessment   Upper Extremity Assessment  Upper Extremity Assessment: Generalized weakness    Lower Extremity Assessment Lower Extremity Assessment: Generalized weakness LLE Deficits / Details: Noted knee flexion contracture ~30    Cervical / Trunk Assessment Cervical / Trunk Assessment: Kyphotic  Communication   Communication: HOH  Cognition Arousal/Alertness: Awake/alert Behavior During Therapy: Flat affect Overall Cognitive Status: History of cognitive impairments - at  baseline Area of Impairment: Orientation;Memory;Following commands;Safety/judgement;Awareness;Problem solving                 Orientation Level: Disoriented to;Time;Person;Situation   Memory: Decreased short-term memory Following Commands: Follows one step commands with increased time Safety/Judgement: Decreased awareness of safety;Decreased awareness of deficits Awareness: Intellectual Problem Solving: Slow processing;Difficulty sequencing;Requires verbal cues;Requires tactile cues General Comments: Patient minimally interactive during session, oriented to self. Recognizes son and appropriate with response to commands despite increased time to process      General Comments      Exercises     Assessment/Plan    PT Assessment All further PT needs can be met in the next venue of care  PT Problem List Decreased strength;Decreased range of motion;Decreased activity tolerance;Decreased balance;Decreased mobility;Decreased cognition;Decreased knowledge of use of DME;Decreased safety awareness;Decreased knowledge of precautions       PT Treatment Interventions      PT Goals (Current goals can be found in the Care Plan section)  Acute Rehab PT Goals Patient Stated Goal: Home at d/c PT Goal Formulation: With family (son)    Frequency     Barriers to discharge        Co-evaluation PT/OT/SLP Co-Evaluation/Treatment: Yes Reason for Co-Treatment: Complexity of the patient's impairments (multi-system involvement) PT goals addressed during session: Mobility/safety with mobility         AM-PAC PT "6 Clicks" Daily Activity  Outcome Measure Difficulty turning over in bed (including adjusting bedclothes, sheets and blankets)?: Total Difficulty moving from lying on back to sitting on the side of the bed? : Total Difficulty sitting down on and standing up from a chair with arms (e.g., wheelchair, bedside commode, etc,.)?: Total Help needed moving to and from a bed to chair  (including a wheelchair)?: Total Help needed walking in hospital room?: Total Help needed climbing 3-5 steps with a railing? : Total 6 Click Score: 6    End of Session   Activity Tolerance: Patient limited by fatigue Patient left: in bed Nurse Communication: Mobility status PT Visit Diagnosis: Muscle weakness (generalized) (M62.81);Other symptoms and signs involving the nervous system (R29.898)    Time: 7989-2119 PT Time Calculation (min) (ACUTE ONLY): 17 min   Charges:   PT Evaluation $PT Eval Moderate Complexity: 1 Procedure     PT G Codes:        Alben Deeds, PT DPT  Board Certified Neurologic Specialist [place] 10/12/2016, 11:22 AM

## 2016-10-13 ENCOUNTER — Inpatient Hospital Stay (HOSPITAL_COMMUNITY): Payer: Medicare Other

## 2016-10-13 DIAGNOSIS — I361 Nonrheumatic tricuspid (valve) insufficiency: Secondary | ICD-10-CM

## 2016-10-13 DIAGNOSIS — I6300 Cerebral infarction due to thrombosis of unspecified precerebral artery: Secondary | ICD-10-CM

## 2016-10-13 DIAGNOSIS — I96 Gangrene, not elsewhere classified: Secondary | ICD-10-CM | POA: Insufficient documentation

## 2016-10-13 LAB — GLUCOSE, CAPILLARY
GLUCOSE-CAPILLARY: 106 mg/dL — AB (ref 65–99)
GLUCOSE-CAPILLARY: 125 mg/dL — AB (ref 65–99)
GLUCOSE-CAPILLARY: 67 mg/dL (ref 65–99)
Glucose-Capillary: 95 mg/dL (ref 65–99)
Glucose-Capillary: 93 mg/dL (ref 65–99)
Glucose-Capillary: 121 mg/dL — ABNORMAL HIGH (ref 65–99)

## 2016-10-13 LAB — HEMOGLOBIN A1C
Hgb A1c MFr Bld: 6 % — ABNORMAL HIGH (ref 4.8–5.6)
MEAN PLASMA GLUCOSE: 126 mg/dL

## 2016-10-13 MED ORDER — CHLORHEXIDINE GLUCONATE 0.12 % MT SOLN
15.0000 mL | Freq: Two times a day (BID) | OROMUCOSAL | Status: DC
  Administered 2016-10-13 – 2016-10-15 (×5): 15 mL via OROMUCOSAL
  Filled 2016-10-13 (×8): qty 15

## 2016-10-13 MED ORDER — ORAL CARE MOUTH RINSE
15.0000 mL | Freq: Two times a day (BID) | OROMUCOSAL | Status: DC
  Administered 2016-10-13 – 2016-10-14 (×4): 15 mL via OROMUCOSAL

## 2016-10-13 MED ORDER — ORAL CARE MOUTH RINSE
15.0000 mL | Freq: Two times a day (BID) | OROMUCOSAL | Status: DC
  Administered 2016-10-13 – 2016-10-14 (×3): 15 mL via OROMUCOSAL

## 2016-10-13 MED ORDER — DEXTROSE-NACL 5-0.45 % IV SOLN
INTRAVENOUS | Status: DC
  Administered 2016-10-13 – 2016-10-16 (×4): via INTRAVENOUS

## 2016-10-13 NOTE — Progress Notes (Signed)
  Echocardiogram 2D Echocardiogram has been performed.  Pieter Partridge 10/13/2016, 2:27 PM

## 2016-10-13 NOTE — Progress Notes (Signed)
Progress Note    [name]  ZOX:[id number] DOB: April 25, 1923  DOA: 10/11/2016 PCP: Stroke, Md, MD    Brief Narrative:   Chief complaint: Follow-up stroke  Medical records reviewed and are as summarized below:  [name] is an 83 y.o. female the PMH of TIA/stroke, peripheral neuropathy, PVD, CAD status post MI, GERD, hyperlipidemia, diabetes, and stage IV chronic kidney disease who was admitted 10/11/16 for evaluation of the acute onset of confusion and aphasia. Workup positive for stroke.  Assessment/Plan:   Principal Problem:   Stroke (cerebrum) (HCC)/Dysarthria Workup positive for 4.6 cm region of confluent acute infarction of the left parietal lobe consistent with left MCA branch vessel infarction. EEG was abnormal with moderate diffuse slowing. LDL 106, on pravastatin. Continue aspirin. Palliative care recommended by stroke M.D.Spoke with son who reports that this has been done during the prior hospitalization, and is fearful that people want to have his mother placed in a skilled facility. Insists that he would like to take her home. I assured him that our main goal was symptom control and goals of care discussion, and that we would support his decision regarding her disposition.  Active Problems:   CKD (chronic kidney disease), stage III Baseline creatinine appears to be around 2.6-2.8. Admission creatinine was 3.0, currently 1.68.    GERD (gastroesophageal reflux disease) Continue Pepcid.    COPD (chronic obstructive pulmonary disease) (HCC) Respiratory status stable. Continue bronchodilators as needed.    HLD (hyperlipidemia) Continue pravastatin.    Hypothyroid Continue Synthroid.    Hypertension Continue metoprolol and hydralazine as needed.    Pressure injury of skin Full thickness wound documented by nursing. Eschar noted. We'll get wound care nurse to evaluate..   Family Communication/Anticipated D/C date and plan/Code Status   DVT prophylaxis:  Heparin ordered. Code Status: Full Code.  Family Communication: Son updated at bedside. Disposition Plan: Home with hospice versus home with home health. Possibly stable for discharge 10/14/16 after palliative care consultation if PEG tube not wanted.   Medical Consultants:    Palliative Care  Neurology   Anti-Infectives:    None  Subjective:   Patient is minimally verbal, pleasant, denies pain, dyspnea, nausea.  Objective:    Vitals:   10/12/16 1800 10/12/16 2229 10/13/16 0106 10/13/16 0506  BP: (!) 172/73 (!) 175/69 (!) 148/79 (!) 196/89  Pulse: 80 78 96 78  Resp: 12 16 16  (!) 22  Temp:  97.6 F (36.4 C) 97.9 F (36.6 C) 97.9 F (36.6 C)  TempSrc:  Axillary Oral Oral  SpO2:  100% 100% 100%  Weight:        Intake/Output Summary (Last 24 hours) at 10/13/16 0912 Last data filed at 10/13/16 0700  Gross per 24 hour  Intake           391.33 ml  Output                0 ml  Net           391.33 ml   Filed Weights   10/11/16 1019 10/12/16 0203  Weight: 50.8 kg (112 lb) 47.1 kg (103 lb 12.8 oz)    Exam: General exam: Awake, alert, minimally verbal. Respiratory system: Lungs diminished in the bases but otherwise clear. Cardiovascular system: Heart sounds are regular. No murmurs, rubs, or gallops. Gastrointestinal system: Abdomen is soft, nontender, nondistended with normal active bowel sounds. Central nervous system: Alert. Unable to assess orientation as patient is minimally verbal. No facial  weakness appreciated. Generalized weakness in all extremities. Extremities: Left BKA. Skin: No rashes. Warm and dry. Psychiatry: Mood and affect flat.  Data Reviewed:   I have personally reviewed following labs and imaging studies:  Labs: Labs show the following:   Basic Metabolic Panel:  Recent Labs Lab 10/11/16 1031 10/12/16 0627  NA 140 143  K 3.8 3.8  CL 109 114*  CO2 21* 20*  GLUCOSE 138* 89  BUN 20 17  CREATININE 2.08* 1.68*  CALCIUM 8.9 8.8*  MG   --  1.7  PHOS  --  3.0   GFR Estimated Creatinine Clearance: 14.5 mL/min (A) (by C-G formula based on SCr of 1.68 mg/dL (H)). Liver Function Tests:  Recent Labs Lab 10/11/16 1031  AST 26  ALT 10*  ALKPHOS 71  BILITOT 0.7  PROT 5.9*  ALBUMIN 3.2*   CBC:  Recent Labs Lab 10/11/16 1031 10/12/16 0627  WBC 5.5 5.1  NEUTROABS 2.8  --   HGB 10.9* 10.0*  HCT 34.7* 32.0*  MCV 90.6 89.9  PLT 361 287   CBG:  Recent Labs Lab 10/12/16 1730 10/12/16 1942 10/13/16 0019 10/13/16 0412 10/13/16 0755  GLUCAP 72 73 67 95 93   Hgb A1c:  Recent Labs  10/12/16 0627  HGBA1C 6.0*   Lipid Profile:  Recent Labs  10/12/16 0627  CHOL 174  HDL 47  LDLCALC 106*  TRIG 106  CHOLHDL 3.7    Microbiology No results found for this or any previous visit (from the past 240 hour(s)).  Procedures and diagnostic studies:  Ct Head Wo Contrast  Result Date: 10/11/2016 CLINICAL DATA:  Aphasia. EXAM: CT HEAD WITHOUT CONTRAST TECHNIQUE: Contiguous axial images were obtained from the base of the skull through the vertex without intravenous contrast. COMPARISON:  MRI and CT 08/12/2016 FINDINGS: Brain: severe chronic small vessel disease throughout the deep white matter and diffuse cerebral atrophy. Low-density in the posterior parietal lobes bilaterally compatible with chronic infarct. Small old lacunar infarct in the left thalamus no acute infarction or hemorrhage. Ventriculomegaly is stable related to ex vacuo dilatation. Vascular: No hyperdense vessel or unexpected calcification. Skull: No acute calvarial abnormality. Sinuses/Orbits: Visualized paranasal sinuses and mastoids clear. Orbital soft tissues unremarkable. Other: None IMPRESSION: Advanced atrophy and chronic small vessel disease changes. Ex vacuo dilatation of the ventricles. Chronic bilateral parietal infarcts and left thalamic lacunar infarct. No acute intracranial abnormality. Electronically Signed   By: Charlett Nose M.D.   On:  10/11/2016 11:07   Mr Brain Wo Contrast  Result Date: 10/12/2016 CLINICAL DATA:  Worsened mental status. EXAM: MRI HEAD WITHOUT CONTRAST TECHNIQUE: Multiplanar, multiecho pulse sequences of the brain and surrounding structures were obtained without intravenous contrast. COMPARISON:  CT 10/11/2016.  MRI 08/12/2016. FINDINGS: Brain: The study suffers from motion degradation. There is a new confluent region of acute infarction in the left parietal lobe measuring approximately 4.6 cm in size consistent with left MCA branch vessel infarction. This is adjacent to the region of infarction seen in May. No other acute infarction. There are a few old small vessel cerebellar infarctions. There are old small vessel infarctions affecting the thalami I and basal ganglia. There chronic small-vessel ischemic changes throughout the cerebral hemispheric white matter. There are old right and left parietal infarctions. The ventricles are chronically enlarged due to central atrophy. No extra-axial collection. No sign of mass lesion. Vascular: Major vessels at the base of the brain show flow. Skull and upper cervical spine: Negative Sinuses/Orbits: Clear/normal Other: None IMPRESSION: 4.6  cm region of confluent acute infarction in the left parietal lobe consistent with left MCA branch vessel infarction. This is adjacent to regions of infarction there were acute in May of this year. Old bilateral parietal infarctions. Extensive chronic small-vessel ischemic changes throughout the brain. Brain atrophy and ex vacuo enlargement of the lateral ventricles. Electronically Signed   By: Paulina Fusi M.D.   On: 10/12/2016 10:11   Dg Chest Port 1 View  Result Date: 10/11/2016 CLINICAL DATA:  Altered mental status. EXAM: PORTABLE CHEST 1 VIEW COMPARISON:  Chest x-ray dated Aug 12, 2016. FINDINGS: The cardiomediastinal silhouette is mildly enlarged, unchanged. Atherosclerotic calcification of the aortic arch. Normal pulmonary vascularity.  Mild bibasilar atelectasis. No focal consolidation, pleural effusion, or pneumothorax. Osteopenia. No acute osseous abnormality. IMPRESSION: 1.  No active cardiopulmonary disease. 2.  Aortic Atherosclerosis (ICD10-I70.0). Electronically Signed   By: Obie Dredge M.D.   On: 10/11/2016 11:18    Medications:   . aspirin  300 mg Rectal Daily   Or  . aspirin EC  325 mg Oral Daily  . calcium-vitamin D  1 tablet Oral Daily  . chlorhexidine  15 mL Mouth Rinse BID  . heparin  5,000 Units Subcutaneous Q8H  . isosorbide dinitrate  20 mg Oral TID  . levothyroxine  100 mcg Oral QAC breakfast  . mouth rinse  15 mL Mouth Rinse q12n4p  . mouth rinse  15 mL Mouth Rinse BID  . metoprolol tartrate  12.5 mg Oral BID  . potassium chloride  20 mEq Oral Daily  . pravastatin  40 mg Oral QPM  . sevelamer carbonate  0.8 g Oral TID WC  . sodium bicarbonate  650 mg Oral Daily   Continuous Infusions: . dextrose 5 % and 0.45% NaCl 50 mL/hr at 10/13/16 0120  . levETIRAcetam Stopped (10/12/16 2247)    Medical decision making is moderately complex therefore this is a level 2 visit. (> 3 problem points, >1 data points, moderate risk: Need 2 out of 3)   Problems/DDx Points   Self limited or minor (max 2)       1   Established problem, stable       1  4+  Established problem, worsening       2   New problem, no additional W/U planned (max 1)       3   New problem, additional W/U planned        4    Data Reviewed Points   Review/order clinical lab tests       1  1  Review/order x-rays       1   Review/order tests (Echo, EKG, PFTs, etc)       1   Discussion of test results w/ performing MD       1   Independent review of image, tracing or specimen       2   Decision to obtain old records       1   Review and summation of old records       2    Level of risk Presenting prob Diagnostics Management   Minimal 1 self limited/minor Labs CXR EKG/EEG U/A U/S Rest Gargles Bandages Dressings   Low 2 or more  self limited/minor 1 stable chronic Acute uncomplicated illness Tests (PFTS) Non-CV imaging Arterial labs Biopsies of skin OTC drugs Minor surgery-no risk PT OT IVF without additives    Moderate 1 or more chronic illnesses w/ mild exac, progression or  S/E from tx 2 or more stable chronic illnesses Undiagnosed new problem w/ uncertain prognosis Acute complicated injury  Stress tests Endoscopies with no risk factors Deep needle or incisional bx CV imaging without risk LP Thoracentesis Paracentesis Minor surgery w/ risks Elective major surgery w/ no risk (open, percutaneous or endoscopic) Prescription drugs Therapeutic nucl med IVF with additives Closed tx of fracture/dislocation    High Severe exac of chronic illness Acute or chronic illness/injury may pose a threat to life or bodily function (ARF) Change in neuro status    CV imaging w/ contrast and risk Cardio electophysiologic tests Endoscopies w/ risk Discography Elective major surgery Emergency major surgery Parenteral controlled substances Drug therapy req monitoring for toxicity DNR/de-escalation of care    MDM Prob points Data points Risk   Straightforward    <1    <1    Min   Low complexity    2    2    Low   Moderate    3    3    Mod   High Complexity    4 or more    4 or more    High      LOS: 1 day   Markia Kyer  [facility] Pager [phone]. If unable to reach me by pager, please call my cell phone at [phone].  *Please refer to [email or web address], password [email or web address] to get updated schedule on who will round on this patient, as hospitalists switch teams weekly. If 7PM-7AM, please contact night-coverage at [email or web address], password [email or web address] for any overnight needs.  10/13/2016, 9:12 AM

## 2016-10-13 NOTE — Consult Note (Addendum)
WOC Nurse wound consult note Reason for Consult: Consult requested for right foot. Wound type: Right foot with necrotic areas to 4th and 5th toes;  4th toe with dry eschar, approx 1X.8cm, 5th toe with dry eschar, .3X.3cm No odor, drainage, or fluctuance.  Topical treatment will not be effective, it is best practice to leave dry stable eschar intact. Dressing procedure/placement/frequency:  Pt has been noted to have a decreased ABI for several years, according to the EMR.  If aggressive plan of care is desired, please consult vascular team for further plan of care. Please re-consult if further assistance is needed.  Thank-you,  Cammie Mcgee MSN, RN, CWOCN, [place], CNS [phone]

## 2016-10-13 NOTE — Progress Notes (Signed)
*  PRELIMINARY RESULTS* Vascular Ultrasound Carotid Duplex (Doppler) has been completed.  Preliminary findings: Very difficult exam due to patients ability to cooperate. Tortuous vessels bilaterally.  No obvious signs of hemodynamically significant stenosis.  Bilateral vertebral arteries not imaged.  Chauncey Fischer 10/13/2016, 2:04 PM

## 2016-10-13 NOTE — Progress Notes (Signed)
  Speech Language Pathology Treatment: Dysphagia  Patient Details Name: [name] MRN: [id number] DOB: [birth year] Today's Date: 10/13/2016 Time: 7782-4235 SLP Time Calculation (min) (ACUTE ONLY): 15 min  Assessment / Plan / Recommendation Clinical Impression  Dysphagia treatment provided for PO trials. Pt more alert and responsive today but minimally verbal; continues to respond "mmhmm" to most questions. Pt consumed small amounts of thin liquid and puree trials without overt s/s of aspiration, vocal quality remained clear. After 6-7 bites/ sips pt declined any further trials. Pt's cognitive status and dependency for feeding put her at an increased risk for aspiration. Recommend initiating full liquid diet with meds either crushed in puree or via alternative means (not sure whether pt will consume orally- with pudding would be better than applesauce). Pt still showing minimal interest in consuming food/ liquid so nutritional status will likely be inadequate even with diet in place. Reviewed findings/ strategies with son- offer food/ liquid that the pt is known to like, ensure she is alert/ responsive, small bites at a time. Will continue to follow for diet tolerance/ provide education.   HPI HPI: Pt is a 83 y.o. female with PMH of TIA/stroke, peripheral neuropathy, PVD, MI/CAD, GERD, HLD, DM, C KD stage IV, hypothyroidism, hypertension, memory loss. Patient presenting with acute onset confusion and aphasia. CXR was clear, Head CT showed nothing acute, MRI ordered. EEG showed diffuse cerebral dysfunction nonspecific in etiology.  Pt with hx of dysphagia and cognitive-linguistic deficits with stroke admissions in the past; on most recent admission May 2018 pt had scattered infarcts in L parietal lobe and R frontal lobe. On that admission pt advanced from D1/ thin to D3/ thin. Also hx of esophageal deficits- dismotility and diverticulum (from SLP note 2017). Pt failed RN swallow screen due to hx of  dysphagia- bedside swallow and cognitive linguistic eval ordered.       SLP Plan  Goals updated       Recommendations  Diet recommendations: Thin liquid;Other(comment) (Full liquid) Liquids provided via: Straw Medication Administration: Other (Comment) (via alt means or crushed in puree) Compensations: Slow rate;Small sips/bites;Other (Comment) (check oral cavity for oral holding or residuals) Postural Changes and/or Swallow Maneuvers: Seated upright 90 degrees                Oral Care Recommendations: Oral care QID Follow up Recommendations: 24 hour supervision/assistance SLP Visit Diagnosis: Dysphagia, unspecified (R13.10) Plan: Goals updated       GO                Metro Kung, MA, CCC-SLP 10/13/2016, 1:20 PM [id number]

## 2016-10-13 NOTE — Progress Notes (Signed)
Palliative Care   [name] is verbally responsive to questions but inconsistent in her responses and confused. In prior meetings with her and her family (and in chart review of subsequent Palliative conversations), it is clear that all children need to be involved in goals of care discussions and care planning. Her son was presently at the bedside, however his two sisters would not be available until this weekend. Palliative will reach out to them tomorrow to schedule a time to meet on Saturday. Importantly, the son is not the unilateral decision maker.   Murrell Converse AGNP-C Palliative Care [phone]  No charge note.

## 2016-10-13 NOTE — Progress Notes (Signed)
STROKE TEAM PROGRESS NOTE   HISTORY OF PRESENT ILLNESS (per record) KALLEN BASIC is an 83 y.o. female with a history of dementia, CHF, CKD stage IV, CAD, DM2, hypertension, hyperlipidemia, MI, PAD, TIA, and recent CVA who cognitively impaired and minimally conversant at baseline.  She was brought to the hospital because family/nurse noted that the patient was less verbal than baseline.  She is non-verbal at times and at times can communicate but at a lower level of thought processing. Patient was noted to be AMS by her caregiver today and brought in to hospital for further evaluation. On consultation, patient appears to be both receptive and expressive aphasic, not able to follow commands and encephalopathic. This according to family can happen to her sometimes on her 'bad days'. She lives with brother who takes care of all her ADLs.  Date last known well: Date: 10/10/2016 Time last known well: Unable to determine Modified Rankin: Rankin Score=4  Patient was not administered IV t-PA secondary to arriving outside of the window. She was admitted to General Neurology for further evaluation and treatment.   SUBJECTIVE (INTERVAL HISTORY) Her son is at the bedside.  The patient is awake, And more communicative today  Palliative care consulted.  Son confirmed patient is DNR, but per Palliative care, goals of care will need to decided with other family members present.  Palliative is attempting to arranging a family meeting this Saturday.   OBJECTIVE Temp:  [97.2 F (36.2 C)-98.1 F (36.7 C)] 98.1 F (36.7 C) (07/26 1001) Pulse Rate:  [68-96] 91 (07/26 1001) Cardiac Rhythm: Normal sinus rhythm (07/26 0036) Resp:  [12-22] 20 (07/26 1001) BP: (148-196)/(69-122) 187/102 (07/26 1001) SpO2:  [9 %-100 %] 100 % (07/26 1001)  CBC:  Recent Labs Lab 10/11/16 1031 10/12/16 0627  WBC 5.5 5.1  NEUTROABS 2.8  --   HGB 10.9* 10.0*  HCT 34.7* 32.0*  MCV 90.6 89.9  PLT 361 287    Basic Metabolic  Panel:  Recent Labs Lab 10/11/16 1031 10/12/16 0627  NA 140 143  K 3.8 3.8  CL 109 114*  CO2 21* 20*  GLUCOSE 138* 89  BUN 20 17  CREATININE 2.08* 1.68*  CALCIUM 8.9 8.8*  MG  --  1.7  PHOS  --  3.0    Lipid Panel:    Component Value Date/Time   CHOL 174 10/12/2016 0627   TRIG 106 10/12/2016 0627   HDL 47 10/12/2016 0627   CHOLHDL 3.7 10/12/2016 0627   VLDL 21 10/12/2016 0627   LDLCALC 106 (H) 10/12/2016 0627   HgbA1c:  Lab Results  Component Value Date   HGBA1C 6.0 (H) 10/12/2016   Urine Drug Screen: No results found for: LABOPIA, COCAINSCRNUR, LABBENZ, AMPHETMU, THCU, LABBARB  Alcohol Level     Component Value Date/Time   ETH <5 12/28/2015 2210    IMAGING  Mr Brain Wo Contrast 10/12/2016 IMPRESSION: 4.6 cm region of confluent acute infarction in the left parietal lobe consistent with left MCA branch vessel infarction. This is adjacent to regions of infarction there were acute in May of this year. Old bilateral parietal infarctions. Extensive chronic small-vessel ischemic changes throughout the brain. Brain atrophy and ex vacuo enlargement of the lateral ventricles.  EEG shows mild diffuse generalized slowing. No epileptiform activity.  Carotid US 10/13/2016 Difficult exam but no hemodynamically significant stenosis  2D Echo 10/13/2016  pending    PHYSICAL EXAM Frail cachectic elderly African-American lady. . Afebrile. Head is nontraumatic. Neck is supple without bruit.  Cardiac exam no murmur or gallop. Lungs are clear to auscultation. Distal pulses are well felt.  Neurological Exam ; Awake alert disoriented.  Speaks only short sentences. Follows simple midline commands only     . Extraocular moments of full range. Blinks to threat bilaterally. Fundi could not be visualized. Face is symmetric without weakness. Tongue is midline. Motor system exam most all 4 extremities against gravity. Left below-knee amputation. Deep tendon reflexes are depressed. Right  plantar downgoing left cannot be tested. Gait not tested  ASSESSMENT/PLAN Ms. SHERISSE FULLILOVE is a 83 y.o. female with history of dementia, CHF, CKD stage IV, CAD, DM2, hypertension, hyperlipidemia, MI, PAD, TIA, and recent CVA presenting after family noticed she was speaking less than baseline. She did not receive IV t-PA due to arriving outside of the treatment window.   Stroke:  4.6 cm region of confluent acute infarction in the left parietal lobe consistent with left MCA branch vessel infarction and adjacent to stroke in May 2018 in the setting of advanced small vessel ischemic disease, multiple prior infarcts, and lateral ex-vacuo ventriculomegaly.  Resultant  diminished speech and communication CT head Advanced atrophy and chronic small vessel disease changes. Ex vacuo dilatation of the ventricles.Chronic bilateral parietal infarcts and left thalamic lacunar infarct.No acute intracranial abnormality.ed  MRI head   4.6 cm region of confluent acute infarction in the left parietal lobe consistent with left MCA branch vessel infarction.  MRA head  ot ordered  Carotid Doppler  Very difficult exam due to patients ability to cooperate. Tortuous vessels bilaterally.  No obvious signs of hemodynamically significant stenosis.  Bilateral vertebral arteries not imaged.  2D Echo  pending   LDL 106  HgbA1c 6.0  SCDs for VTE prophylaxis  Diet NPO time specified Except for: Other (See Comments)  aspirin 325 mg daily prior to admission, now on aspirin 325 mg daily  Patient counseled to be compliant with her antithrombotic medications  Ongoing aggressive stroke risk factor management  Therapy recommendations:   pending  Disposition:   pending  Hypertension  Stable  Permissive hypertension (OK if < 220/120) but gradually normalize in 5-7 days  Long-term BP goal normotensive  Hyperlipidemia  Home meds:  pravastatin 40mg  PO daily, resumed in hospital  LDL 106, goal < 70  Continue  statin at discharge  Diabetes  HgbA1c 6.0, goal < 7.0  Controlled  Other Stroke Risk Factors  Advanced age  Hx stroke/TIA  Other Active Problems  None  Hospital day # 1  I have personally examined this patient, reviewed notes, independently viewed imaging studies, participated in medical decision making and plan of care.ROS completed by me personally and pertinent positives fully documented  I have made any additions or clarifications directly to the above note. Patient has had multiple strokes with significant dementia at baseline and the quality of life is quite poor. I had a long discussion with the son with regards to serious consideration for palliative care and comfort care. He plans to meet with his sister and palliative care team and make a final decision soon. Discussed with Dr. Darnelle Catalan. Greater than 50% time during this 25 minute visit was spent on counseling and coordination of care about her stroke, dementia and quality of life discussion.  Delia Heady, MD Medical Director [facility] Pager: [phone] 10/13/2016 3:08 PM   To contact Stroke Continuity provider, please refer to [email or web address]. After hours, contact General Neurology

## 2016-10-14 DIAGNOSIS — R29818 Other symptoms and signs involving the nervous system: Secondary | ICD-10-CM

## 2016-10-14 DIAGNOSIS — Z515 Encounter for palliative care: Secondary | ICD-10-CM

## 2016-10-14 DIAGNOSIS — I96 Gangrene, not elsewhere classified: Secondary | ICD-10-CM

## 2016-10-14 LAB — ECHOCARDIOGRAM COMPLETE: Weight: 1660.8 oz

## 2016-10-14 NOTE — Progress Notes (Addendum)
Progress Note    NERI VIEYRA  VWU:[id number] DOB: 15-Jun-1923  DOA: 10/11/2016 PCP: Stroke, Md, MD    Brief Narrative:   Chief complaint: Follow-up stroke  Medical records reviewed and are as summarized below:  [name] is an 83 y.o. female the PMH of TIA/stroke, peripheral neuropathy, PVD, CAD status post MI, GERD, hyperlipidemia, diabetes, and stage IV chronic kidney disease who was admitted 10/11/16 for evaluation of the acute onset of confusion and aphasia. Workup positive for stroke.  Assessment/Plan:   Principal Problem:   Stroke (cerebrum) (HCC)/Dysarthria Workup positive for 4.6 cm region of confluent acute infarction of the left parietal lobe consistent with left MCA branch vessel infarction. EEG was abnormal with moderate diffuse slowing. LDL 106, on pravastatin. Continue aspirin. Palliative care recommended by stroke M.D.Spoke with son who reports that this has been done during the prior hospitalization, and is fearful that people want to have his mother placed in a skilled facility. Insists that he would like to take her home. I assured him that our main goal was symptom control and goals of care discussion, and that we would support the decision of the family regarding her disposition. Apparently he is frequently in the hospital himself, and his sisters care for Mrs. Steuart during those times, and they have wished to have her placed in a SNF. Difficult situation.  Active Problems:   CKD (chronic kidney disease), stage III Baseline creatinine appears to be around 2.6-2.8. Admission creatinine was 3.0, currently 1.68.    GERD (gastroesophageal reflux disease) Continue Pepcid.    COPD (chronic obstructive pulmonary disease) (HCC) Respiratory status stable. Continue bronchodilators as needed.    HLD (hyperlipidemia) Continue pravastatin.    Hypothyroid Continue Synthroid.    Hypertension Continue metoprolol and hydralazine as needed.   Skin necrosis to  toes  Right foot with necrotic areas to 4th and 5th toes; 4th toe with dry eschar, approx 1X.8cm, 5th toe with dry eschar, .3X.3cm documented by WOC. Topical therapy not felt to be effective and recommendations are to leave dry, stable eschar intact.   Family Communication/Anticipated D/C date and plan/Code Status   DVT prophylaxis: Heparin ordered. Code Status: Full Code.  Family Communication: No family present at the bedside today. Disposition Plan: Home with hospice versus home with home health. Possibly stable for discharge 10/14/16 after palliative care consultation if PEG tube not wanted.   Medical Consultants:    Palliative Care  Neurology   Anti-Infectives:    None  Subjective:   Patient is minimally verbal. RN reports patient has been refusing oral medications. No vomiting, no BMs today per RN.  Objective:    Vitals:   10/13/16 2200 10/13/16 2204 10/14/16 0139 10/14/16 0500  BP: (!) 191/93 (!) 175/83 (!) 169/90 (!) 157/105  Pulse: (!) 102 98 (!) 106 (!) 103  Resp: 18  18 18   Temp: 97.7 F (36.5 C)  98.6 F (37 C) 98 F (36.7 C)  TempSrc: Axillary  Axillary Axillary  SpO2: 100%  98% 100%  Weight:    47.9 kg (105 lb 8 oz)    Intake/Output Summary (Last 24 hours) at 10/14/16 0827 Last data filed at 10/13/16 1500  Gross per 24 hour  Intake              400 ml  Output                0 ml  Net  400 ml   Filed Weights   10/11/16 1019 10/12/16 0203 10/14/16 0500  Weight: 50.8 kg (112 lb) 47.1 kg (103 lb 12.8 oz) 47.9 kg (105 lb 8 oz)    Exam: Unchanged from 10/13/16 except as noted in red General exam: Awake, alert, minimally verbal. Respiratory system: Lungs diminished in the bases with occasional rhonchi. Cardiovascular system: Heart sounds are regular. No murmurs, rubs, or gallops. Gastrointestinal system: Abdomen is soft, nontender, nondistended with normal active bowel sounds. Central nervous system: Alert. Unable to assess orientation  as patient is minimally verbal. No facial weakness appreciated. Generalized weakness in all extremities. Does not follow commands. Extremities: Left BKA. No edema right.  Onychomycosis to nails on right foot. Eschar to fourth and fifth toes. Skin: No rashes. Warm and dry. Psychiatry: Mood and affect flat.  Data Reviewed:   I have personally reviewed following labs and imaging studies:  Labs: Labs show the following:   Basic Metabolic Panel:  Recent Labs Lab 10/11/16 1031 10/12/16 0627  NA 140 143  K 3.8 3.8  CL 109 114*  CO2 21* 20*  GLUCOSE 138* 89  BUN 20 17  CREATININE 2.08* 1.68*  CALCIUM 8.9 8.8*  MG  --  1.7  PHOS  --  3.0   GFR Estimated Creatinine Clearance: 14.6 mL/min (A) (by C-G formula based on SCr of 1.68 mg/dL (H)). Liver Function Tests:  Recent Labs Lab 10/11/16 1031  AST 26  ALT 10*  ALKPHOS 71  BILITOT 0.7  PROT 5.9*  ALBUMIN 3.2*   CBC:  Recent Labs Lab 10/11/16 1031 10/12/16 0627  WBC 5.5 5.1  NEUTROABS 2.8  --   HGB 10.9* 10.0*  HCT 34.7* 32.0*  MCV 90.6 89.9  PLT 361 287   CBG:  Recent Labs Lab 10/13/16 0412 10/13/16 0755 10/13/16 1159 10/13/16 1603 10/13/16 2006  GLUCAP 95 93 106* 125* 121*   Hgb A1c:  Recent Labs  10/12/16 0627  HGBA1C 6.0*   Lipid Profile:  Recent Labs  10/12/16 0627  CHOL 174  HDL 47  LDLCALC 106*  TRIG 106  CHOLHDL 3.7    Microbiology No results found for this or any previous visit (from the past 240 hour(s)).  Procedures and diagnostic studies:  Mr Brain Wo Contrast  Result Date: 10/12/2016 CLINICAL DATA:  Worsened mental status. EXAM: MRI HEAD WITHOUT CONTRAST TECHNIQUE: Multiplanar, multiecho pulse sequences of the brain and surrounding structures were obtained without intravenous contrast. COMPARISON:  CT 10/11/2016.  MRI 08/12/2016. FINDINGS: Brain: The study suffers from motion degradation. There is a new confluent region of acute infarction in the left parietal lobe measuring  approximately 4.6 cm in size consistent with left MCA branch vessel infarction. This is adjacent to the region of infarction seen in May. No other acute infarction. There are a few old small vessel cerebellar infarctions. There are old small vessel infarctions affecting the thalami I and basal ganglia. There chronic small-vessel ischemic changes throughout the cerebral hemispheric white matter. There are old right and left parietal infarctions. The ventricles are chronically enlarged due to central atrophy. No extra-axial collection. No sign of mass lesion. Vascular: Major vessels at the base of the brain show flow. Skull and upper cervical spine: Negative Sinuses/Orbits: Clear/normal Other: None IMPRESSION: 4.6 cm region of confluent acute infarction in the left parietal lobe consistent with left MCA branch vessel infarction. This is adjacent to regions of infarction there were acute in May of this year. Old bilateral parietal infarctions. Extensive chronic small-vessel  ischemic changes throughout the brain. Brain atrophy and ex vacuo enlargement of the lateral ventricles. Electronically Signed   By: Paulina Fusi M.D.   On: 10/12/2016 10:11    Medications:   . aspirin  300 mg Rectal Daily   Or  . aspirin EC  325 mg Oral Daily  . calcium-vitamin D  1 tablet Oral Daily  . chlorhexidine  15 mL Mouth Rinse BID  . heparin  5,000 Units Subcutaneous Q8H  . isosorbide dinitrate  20 mg Oral TID  . levothyroxine  100 mcg Oral QAC breakfast  . mouth rinse  15 mL Mouth Rinse q12n4p  . mouth rinse  15 mL Mouth Rinse BID  . metoprolol tartrate  12.5 mg Oral BID  . potassium chloride  20 mEq Oral Daily  . pravastatin  40 mg Oral QPM  . sevelamer carbonate  0.8 g Oral TID WC  . sodium bicarbonate  650 mg Oral Daily   Continuous Infusions: . dextrose 5 % and 0.45% NaCl 50 mL/hr at 10/13/16 2045  . levETIRAcetam Stopped (10/13/16 2240)    Medical decision making is moderately complex therefore this is a  level 2 visit. (> 3 problem points, >1 data points, moderate risk: Need 2 out of 3)   Problems/DDx Points   Self limited or minor (max 2)       1   Established problem, stable       1  4+  Established problem, worsening       2   New problem, no additional W/U planned (max 1)       3   New problem, additional W/U planned        4    Data Reviewed Points   Review/order clinical lab tests       1  1  Review/order x-rays       1   Review/order tests (Echo, EKG, PFTs, etc)       1   Discussion of test results w/ performing MD       1   Independent review of image, tracing or specimen       2   Decision to obtain old records       1   Review and summation of old records       2    Level of risk Presenting prob Diagnostics Management   Minimal 1 self limited/minor Labs CXR EKG/EEG U/A U/S Rest Gargles Bandages Dressings   Low 2 or more self limited/minor 1 stable chronic Acute uncomplicated illness Tests (PFTS) Non-CV imaging Arterial labs Biopsies of skin OTC drugs Minor surgery-no risk PT OT IVF without additives    Moderate 1 or more chronic illnesses w/ mild exac, progression or S/E from tx 2 or more stable chronic illnesses Undiagnosed new problem w/ uncertain prognosis Acute complicated injury  Stress tests Endoscopies with no risk factors Deep needle or incisional bx CV imaging without risk LP Thoracentesis Paracentesis Minor surgery w/ risks Elective major surgery w/ no risk (open, percutaneous or endoscopic) Prescription drugs Therapeutic nucl med IVF with additives Closed tx of fracture/dislocation    High Severe exac of chronic illness Acute or chronic illness/injury may pose a threat to life or bodily function (ARF) Change in neuro status    CV imaging w/ contrast and risk Cardio electophysiologic tests Endoscopies w/ risk Discography Elective major surgery Emergency major surgery Parenteral controlled substances Drug therapy req monitoring for  toxicity DNR/de-escalation of care  MDM Prob points Data points Risk   Straightforward    <1    <1    Min   Low complexity    2    2    Low   Moderate    3    3    Mod   High Complexity    4 or more    4 or more    High      LOS: 2 days   Parisha Beaulac  [facility] Pager [phone]. If unable to reach me by pager, please call my cell phone at [phone].  *Please refer to [email or web address], password [email or web address] to get updated schedule on who will round on this patient, as hospitalists switch teams weekly. If 7PM-7AM, please contact night-coverage at [email or web address], password [email or web address] for any overnight needs.  10/14/2016, 8:27 AM

## 2016-10-14 NOTE — Consult Note (Signed)
Consultation Note Date: 10/14/2016   Patient Name: [name]  DOB: 01/29/1924  MRN: [id number]  Age / Sex: 83 y.o., female  PCP: Stroke, Md, MD Referring Physician: Rama, Maryruth Bun, MD  Reason for Consultation: Establishing goals of care, Hospice Evaluation and Psychosocial/spiritual support  HPI/Patient Profile: 83 y.o. female  with past medical history of CVA in May 2018 as well as prior to this, cardiomyopathy, chronic diastolic heart failure with an EF of 45-50%, chronic kidney disease stage IV (creatinine 1.68) ,diabetes, dyslipidemia, GERD, anemia, left BKA, end-stage vascular dementia admitted on 10/11/2016 with altered mental status. Per MRI patient has new 4.6 cm region of confluent acute infarction in the left parietal lobe consistent with left MCA branch vessel infarction. This is adjacent to regions of known infarction in May 2018. Old bilateral parietal infarctions are noted. Patient has extensive chronic small vessel ischemic changes throughout the brain as well as extensive atrophy..   Patient at baseline is bed bound and cared for by her son in the home. She has what family describes "good and bad days", where she is more alert and able to eat more. Patient has been seen by palliative medicine service going back to 2014 and most recently during her hospitalization for CVA in May 2018. At that time family was hopeful that patient would meet residential hospice criteria but she rebounded and became more alert and increased her oral intake; subsequently went back home with her son  Clinical Assessment and Goals of Care: Patient seen,daughter Meriam Sprague at the bedside. Patient is nonverbal after this most recent stroke. Her baseline is nonambulatory, able to speak, and able to take oral nutrition. When seen by speech during this hospitalization patient only took a half a teaspoon of ice cream and then  refused more. Per chart review she is also refusing any medications that are crushed in applesauce. Her daughter Meriam Sprague, is at the bedside, and states she has refused to take anything orally from her as well.  Patient is unable to speak for herself, and her 3 children are her decision makers. She resides with her son Mathis Fare, who in the past has been adamantly opposed to skilled nursing facility placement. He himself is in poor health.     SUMMARY OF RECOMMENDATIONS   Confirmed DNR/DNI Family meeting scheduled for 10/15/2016 at 12 PM to address goals of care such as feeding tubes as well as support in the home In the past, it has been difficult to provide assistance, such as HH or hospice in the home. Son, Mathis Fare, refuses for her to go to a nursing home and this is been the issue that her daughters have encountered in the past. Daughters report that Mathis Fare will agree to care in the home but then will not let the nurses in the house. When last seen by palliative medicine in May 2018, daughters were hopeful for residential hospice but she did not qualify at that time. Both son, Mathis Fare, and daughter Meriam Sprague, do not feel that their mother would want a  feeding tube. We will address this at goals of care meeting on 10/15/2016 Code Status/Advance Care Planning:  DNR    Symptom Management:   Pain: Patient has pain to her left leg. Continue with Tylenol for now  Palliative Prophylaxis:   Aspiration, Bowel Regimen, Delirium Protocol, Frequent Pain Assessment, Oral Care and Turn Reposition  Additional Recommendations (Limitations, Scope, Preferences):  Full Scope Treatment except for DNR/DNI  Psycho-social/Spiritual:   Desire for further Chaplaincy support:no  Additional Recommendations: Grief/Bereavement Support  Prognosis:  It has been difficult  to prognosticate patients clinical condition in the setting of multiple strokes, waxing and waning levels of consciousness. She likely though has  a prognosis of less than 2 weeks given the severity of her current clinical condition and oral intake ( family leaning towards no artificial feeding ),  with comorbidities of advanced vascular dementia, chronic kidney disease stage IV, chronic diastolic heart failure, bedbound status and now, new acute left parietal infarct.  Discharge Planning: To Be Determined      Primary Diagnoses: Present on Admission: . Stroke (cerebrum) (HCC) . (Resolved) Stroke [facility])   I have reviewed the medical record, interviewed the patient and family, and examined the patient. The following aspects are pertinent.  Past Medical History:  Diagnosis Date  . Abnormal nuclear stress test 06/01/09   Demonstrated a new area of infarct scar, peri-infarct ischemia seen in the inferolateral territory. EF eas normal at 70% with mild hypocontractility at the apex, distal inferolateral wall.  . Anemia   . Arthritis   . Cardiomyopathy, idiopathic (HCC) 02/08/2011  . Chronic diastolic CHF (congestive heart failure) (HCC)    Takes Lasix  . Chronic kidney disease (CKD), stage IV (severe) (HCC)   . Coronary artery disease    a. Cath 09/2010 - med rx.  . Diabetes mellitus    type 2 NIDDM  . Dyslipidemia   . GERD (gastroesophageal reflux disease)   . Gout    takes allopurinol  . H/O echocardiogram 09/06/11   Indication- nonIschemic Cardiomyopathy. EF = now greater than 55% with no regional wall motion abnormalities. Tthere is mild to moderate trisuspid regurgitayion and mild pulmonary hypertension with an RVSP of 35 mmHg as well as stage 1 diastolic dysfunction and mild to moderate LVH.  Marland Kitchen Hx of transient ischemic attack (TIA)   . Hypertension   . Hypothyroidism    (SEVERE) Takes Levothryroxine  . Irregular heartbeat   . Memory loss   . Myocardial infarct (HCC)    x 3 unsure of years  . Nonischemic cardiomyopathy (HCC)    EF now is 55%, reduced due to myxedema, which is improved.  . Peripheral neuropathy   .  Peripheral vascular disease (HCC)    a. s/p L BKA.  . Shingles   . Stroke (HCC)   . TIA (transient ischemic attack)   . Ulcer    Social History   Social History  . Marital status: Widowed    Spouse name: N/A  . Number of children: 4  . Years of education: N/A   Occupational History  . homemaker    Social History Main Topics  . Smoking status: Former Smoker    Packs/day: 0.50    Years: 40.00    Quit date: 07/05/1986  . Smokeless tobacco: Former Neurosurgeon     Comment: quit smoking 1988  . Alcohol use No  . Drug use: No  . Sexual activity: No   Other Topics Concern  . None   Social History Narrative  .  None   Family History  Problem Relation Age of Onset  . Heart attack Daughter   . Heart disease Daughter        Before age 26  . Cancer Sister        STOMACH  . Diabetes Sister   . Cancer Brother        BONE  . Diabetes Brother   . Hyperlipidemia Daughter   . Hypertension Daughter   . Heart disease Daughter        before age 30  . Kidney disease Daughter   . Other Daughter        varicose veins  . Diabetes Daughter   . Heart disease Son        before age 75  . Hyperlipidemia Son   . Hypertension Son   . Heart attack Son   . Anesthesia problems Neg Hx   . Hypotension Neg Hx   . Malignant hyperthermia Neg Hx   . Pseudochol deficiency Neg Hx    Scheduled Meds: . aspirin  300 mg Rectal Daily   Or  . aspirin EC  325 mg Oral Daily  . calcium-vitamin D  1 tablet Oral Daily  . chlorhexidine  15 mL Mouth Rinse BID  . heparin  5,000 Units Subcutaneous Q8H  . isosorbide dinitrate  20 mg Oral TID  . levothyroxine  100 mcg Oral QAC breakfast  . mouth rinse  15 mL Mouth Rinse q12n4p  . mouth rinse  15 mL Mouth Rinse BID  . metoprolol tartrate  12.5 mg Oral BID  . potassium chloride  20 mEq Oral Daily  . pravastatin  40 mg Oral QPM  . sevelamer carbonate  0.8 g Oral TID WC  . sodium bicarbonate  650 mg Oral Daily   Continuous Infusions: . dextrose 5 % and 0.45%  NaCl 50 mL/hr at 10/13/16 2045  . levETIRAcetam 500 mg (10/14/16 1102)   PRN Meds:.famotidine, hydrALAZINE, ipratropium-albuterol Medications Prior to Admission:  Prior to Admission medications   Medication Sig Start Date End Date Taking? Authorizing Provider  acetaminophen (TYLENOL) 325 MG tablet Take 650 mg by mouth every 6 (six) hours as needed for mild pain.   Yes [provider]  acetaminophen (TYLENOL) 325 MG tablet Take 2 tablets (650 mg total) by mouth every 6 (six) hours as needed for mild pain (or Fever >/= 101). 08/16/16  Yes Johnson, Clanford L, MD  albuterol-ipratropium (COMBIVENT) 18-103 MCG/ACT inhaler Inhale 1-2 puffs into the lungs at bedtime as needed for wheezing or shortness of breath.    Yes [provider]  aspirin EC 81 MG tablet Take 81 mg by mouth daily.   Yes [provider]  Calcium Carbonate-Vitamin D (CALCIUM-VITAMIN D) 500-200 MG-UNIT tablet Take 1 tablet by mouth daily.   Yes [provider]  docusate sodium (COLACE) 100 MG capsule Take 100 mg by mouth daily as needed for mild constipation.   Yes [provider]  famotidine (PEPCID) 20 MG tablet Take 1 tablet (20 mg total) by mouth at bedtime. Patient taking differently: Take 20 mg by mouth at bedtime as needed for heartburn.  11/13/15  Yes Rodolph Bong, MD  furosemide (LASIX) 40 MG tablet Take 1 tablet (40 mg total) by mouth daily. 08/16/16  Yes Johnson, Clanford L, MD  hydrALAZINE (APRESOLINE) 50 MG tablet Take 1 tablet (50 mg total) by mouth 3 (three) times daily. 08/16/16  Yes Johnson, Clanford L, MD  isosorbide dinitrate (ISORDIL) 20 MG tablet Take  1 tablet (20 mg total) by mouth 3 (three) times daily. 04/22/16  Yes Maxie Barb, MD  levothyroxine (SYNTHROID, LEVOTHROID) 100 MCG tablet Take 1 tablet (100 mcg total) by mouth daily. 04/22/16  Yes Maxie Barb, MD  metoprolol tartrate (LOPRESSOR) 25 MG tablet Take 0.5 tablets (12.5 mg total) by mouth 2  (two) times daily. 04/22/16  Yes Maxie Barb, MD  potassium chloride 20 MEQ/15ML (10%) SOLN Take 15 mLs (20 mEq total) by mouth daily. 04/22/16  Yes Maxie Barb, MD  pravastatin (PRAVACHOL) 40 MG tablet Take 40 mg by mouth every evening.    Yes [provider]  sevelamer carbonate (RENVELA) 0.8 g PACK packet Take 0.8 g by mouth 3 (three) times daily with meals. 04/22/16  Yes Maxie Barb, MD  sodium bicarbonate 650 MG tablet Take 1 tablet (650 mg total) by mouth daily. 11/13/15  Yes Rodolph Bong, MD   Allergies  Allergen Reactions  . Aspirin Nausea And Vomiting and Other (See Comments)    325 mg (adult strength) [name]    Review of Systems  Unable to perform ROS: Acuity of condition    Physical Exam  Constitutional:  Elderly, frail female in no acute distress. She is awake, nonverbal, not following commands  Cardiovascular:  Tachycardic  Pulmonary/Chest: Effort normal.  Neurological:  Her eyes are open, she is looking around the room; she does not follow commands and is currently nonverbal  Skin: Skin is warm and dry.  Psychiatric:  Unable to test  Nursing note and vitals reviewed.   Vital Signs: BP (!) 168/99 (BP Location: Left Arm)   Pulse 91   Temp 97.6 F (36.4 C) (Axillary)   Resp 18   Wt 47.9 kg (105 lb 8 oz)   SpO2 99%   BMI 22.83 kg/m  Pain Assessment: FLACC   Pain Score: 0-No pain   SpO2: SpO2: 99 % O2 Device:SpO2: 99 % O2 Flow Rate: .   IO: Intake/output summary: No intake or output data in the 24 hours ending 10/14/16 1706  LBM: Last BM Date: 10/13/16 Baseline Weight: Weight: 50.8 kg (112 lb) Most recent weight: Weight: 47.9 kg (105 lb 8 oz)     Palliative Assessment/Data:   Flowsheet Rows     Most Recent Value  Intake Tab  Referral Department  Hospitalist  Unit at Time of Referral  Other (Comment)  Palliative Care Primary Diagnosis  Neurology    Date Notified  10/13/16  Palliative Care Type  Return patient Palliative Care  Reason for referral  Clarify Goals of Care, Counsel Regarding Hospice  Date of Admission  10/11/16  Date first seen by Palliative Care  10/14/16  # of days Palliative referral response time  1 Day(s)  # of days IP prior to Palliative referral  2  Clinical Assessment  Palliative Performance Scale Score  20%  Pain Max last 24 hours  Not able to report  Pain Min Last 24 hours  Not able to report  Dyspnea Max Last 24 Hours  Not able to report  Dyspnea Min Last 24 hours  Not able to report  Nausea Max Last 24 Hours  Not able to report  Nausea Min Last 24 Hours  Not able to report  Anxiety Max Last 24 Hours  Not able to report  Anxiety Min Last 24 Hours  Not able to report  Other Max Last 24 Hours  Not  able to report  Psychosocial & Spiritual Assessment  Palliative Care Outcomes  Patient/Family meeting held?  Yes  Who was at the meeting?  daughter [facility]  Palliative Care follow-up planned  Yes, Facility      Time In: 1600 Time Out: 1715 Time Total: 75 min Greater than 50%  of this time was spent counseling and coordinating care related to the above assessment and plan.  Signed by: Irean Hong, NP   Please contact Palliative Medicine Team phone at [phone] for questions and concerns.  For individual provider: See Loretha Stapler

## 2016-10-14 NOTE — Progress Notes (Signed)
  Speech Language Pathology Treatment: Dysphagia  Patient Details Name: [name] MRN: [id number] DOB: 14-Apr-1923 Today's Date: 10/14/2016 Time: 1330-1340 SLP Time Calculation (min) (ACUTE ONLY): 10 min  Assessment / Plan / Recommendation Clinical Impression  Today pt only accepted 1/2 tsp of icecream followed by delayed swallow and no indication of airway compromise.   She declined all further po offerings including ginger-ale.   ? How much of poor po intake is secondary to dementia.    If pt will not consume po = she will be unable to meet hydration/nutritional needs, note palliative team following patient.  Updated sign above bed to orally suction pt if she does not elicit swallow due to aspiration risk.    Pt's son was educated yesterday to findings/recommendations per review of chart.  RN reports pt will not consume po.  At this time will sign off as no further treatment warranted as pt on diet with precautions.  Please re-consult if needed.      HPI HPI: Pt is a 83 y.o. female with PMH of TIA/stroke, peripheral neuropathy, PVD, MI/CAD, GERD, HLD, DM, C KD stage IV, hypothyroidism, hypertension, memory loss. Patient presenting with acute onset confusion and aphasia. CXR was clear, Head CT showed nothing acute, MRI ordered. EEG showed diffuse cerebral dysfunction nonspecific in etiology.  Pt with hx of dysphagia and cognitive-linguistic deficits with stroke admissions in the past; on most recent admission May 2018 pt had scattered infarcts in L parietal lobe and R frontal lobe. On that admission pt advanced from D1/ thin to D3/ thin. Also hx of esophageal deficits- dismotility and diverticulum (from SLP note 2017). Pt failed RN swallow screen due to hx of dysphagia- bedside swallow and cognitive linguistic eval ordered.       SLP Plan  Goals updated       Recommendations  Diet recommendations: Thin liquid;Other(comment) (continue offering full liquids but doubt adequacy of  po) Liquids provided via: Straw Medication Administration: Other (Comment) (via alt means or crushed in puree) Supervision: Full supervision/cueing for compensatory strategies Compensations: Slow rate;Small sips/bites;Other (Comment) (check oral cavity for oral holding or residuals, oral suction if pt holding) Postural Changes and/or Swallow Maneuvers: Seated upright 90 degrees;Upright 30-60 min after meal                Oral Care Recommendations: Oral care QID Follow up Recommendations: 24 hour supervision/assistance SLP Visit Diagnosis: Dysphagia, unspecified (R13.10) Plan: Goals updated       GO               Shannon Burnet, MS [facility] SLP [phone]  [name] 10/14/2016, 1:49 PM

## 2016-10-14 NOTE — Progress Notes (Signed)
STROKE TEAM PROGRESS NOTE   HISTORY OF PRESENT ILLNESS (per record) [name] is an 83 y.o. female with a history of dementia, CHF, CKD stage IV, CAD, DM2, hypertension, hyperlipidemia, MI, PAD, TIA, and recent CVA who cognitively impaired and minimally conversant at baseline.  She was brought to the hospital because family/nurse noted that the patient was less verbal than baseline.  She is non-verbal at times and at times can communicate but at a lower level of thought processing. Patient was noted to be AMS by her caregiver today and brought in to hospital for further evaluation. On consultation, patient appears to be both receptive and expressive aphasic, not able to follow commands and encephalopathic. This according to family can happen to her sometimes on her 'bad days'. She lives with brother who takes care of all her ADLs.  Date last known well: Date: 10/10/2016 Time last known well: Unable to determine Modified Rankin: Rankin Score=4  Patient was not administered IV t-PA secondary to arriving outside of the window. She was admitted to General Neurology for further evaluation and treatment.   SUBJECTIVE (INTERVAL HISTORY) Her son is not at the bedside.  T  Palliative care consulted.  S .  Palliative is attempting to arranging a family meeting this Saturday. 2DEcho shows decrease EF 45% but no clot   OBJECTIVE Temp:  [97.5 F (36.4 C)-98.6 F (37 C)] 97.5 F (36.4 C) (07/27 0941) Pulse Rate:  [97-110] 110 (07/27 0941) Cardiac Rhythm: Sinus tachycardia (07/27 0700) Resp:  [17-19] 17 (07/27 0941) BP: (157-196)/(83-112) 163/112 (07/27 0941) SpO2:  [98 %-100 %] 100 % (07/27 0941) Weight:  [105 lb 8 oz (47.9 kg)] 105 lb 8 oz (47.9 kg) (07/27 0500)  CBC:   Recent Labs Lab 10/11/16 1031 10/12/16 0627  WBC 5.5 5.1  NEUTROABS 2.8  --   HGB 10.9* 10.0*  HCT 34.7* 32.0*  MCV 90.6 89.9  PLT 361 287    Basic Metabolic Panel:   Recent Labs Lab 10/11/16 1031  10/12/16 0627  NA 140 143  K 3.8 3.8  CL 109 114*  CO2 21* 20*  GLUCOSE 138* 89  BUN 20 17  CREATININE 2.08* 1.68*  CALCIUM 8.9 8.8*  MG  --  1.7  PHOS  --  3.0    Lipid Panel:     Component Value Date/Time   CHOL 174 10/12/2016 0627   TRIG 106 10/12/2016 0627   HDL 47 10/12/2016 0627   CHOLHDL 3.7 10/12/2016 0627   VLDL 21 10/12/2016 0627   LDLCALC 106 (H) 10/12/2016 0627   HgbA1c:  Lab Results  Component Value Date   HGBA1C 6.0 (H) 10/12/2016   Urine Drug Screen: No results found for: LABOPIA, COCAINSCRNUR, LABBENZ, AMPHETMU, THCU, LABBARB  Alcohol Level     Component Value Date/Time   ETH <5 12/28/2015 2210    IMAGING  Mr Brain Wo Contrast 10/12/2016 IMPRESSION: 4.6 cm region of confluent acute infarction in the left parietal lobe consistent with left MCA branch vessel infarction. This is adjacent to regions of infarction there were acute in May of this year. Old bilateral parietal infarctions. Extensive chronic small-vessel ischemic changes throughout the brain. Brain atrophy and ex vacuo enlargement of the lateral ventricles.  EEG shows mild diffuse generalized slowing. No epileptiform activity.  Carotid US 10/13/2016 Difficult exam but no hemodynamically significant stenosis  2D Echo 10/13/2016 Left ventricle: The cavity size was normal. Wall thickness was   increased in a pattern of mild LVH. Systolic function  was mildly   reduced. The estimated ejection fraction was in the range of 45%   to 50%. Inferior hypokinesis    PHYSICAL EXAM Frail cachectic elderly African-American lady. . Afebrile. Head is nontraumatic. Neck is supple without bruit.    Cardiac exam no murmur or gallop. Lungs are clear to auscultation. Distal pulses are well felt.  Neurological Exam ; Awake alert disoriented.  Speaks only short sentences. Follows simple midline commands only     . Extraocular moments of full range. Blinks to threat bilaterally. Fundi could not be visualized.  Face is symmetric without weakness. Tongue is midline. Motor system exam most all 4 extremities against gravity. Left below-knee amputation. Deep tendon reflexes are depressed. Right plantar downgoing left cannot be tested. Gait not tested  ASSESSMENT/PLAN [name] is a 83 y.o. female with history of dementia, CHF, CKD stage IV, CAD, DM2, hypertension, hyperlipidemia, MI, PAD, TIA, and recent CVA presenting after family noticed she was speaking less than baseline. She did not receive IV t-PA due to arriving outside of the treatment window.   Stroke:  4.6 cm region of confluent acute infarction in the left parietal lobe consistent with left MCA branch vessel infarction and adjacent to stroke in May 2018 in the setting of advanced small vessel ischemic disease, multiple prior infarcts, and lateral ex-vacuo ventriculomegaly.  Resultant  diminished speech and communication CT head Advanced atrophy and chronic small vessel disease changes. Ex vacuo dilatation of the ventricles.Chronic bilateral parietal infarcts and left thalamic lacunar infarct.No acute intracranial abnormality.ed  MRI head   4.6 cm region of confluent acute infarction in the left parietal lobe consistent with left MCA branch vessel infarction.  MRA head  ot ordered  Carotid Doppler  Very difficult exam due to patients ability to cooperate. Tortuous vessels bilaterally.  No obvious signs of hemodynamically significant stenosis.  Bilateral vertebral arteries not imaged. 2D Echo  Left ventricle: The cavity size was normal. Wall thickness was   increased in a pattern of mild LVH. Systolic function was mildly   reduced. The estimated ejection fraction was in the range of 45%   to 50%. Inferior hypokinesis  LDL 106  HgbA1c 6.0  SCDs for VTE prophylaxis Diet full liquid Room service appropriate? Yes; Fluid consistency: Thin  aspirin 325 mg daily prior to admission, now on aspirin 325 mg daily  Patient counseled to be  compliant with her antithrombotic medications  Ongoing aggressive stroke risk factor management  Therapy recommendations:   pending  Disposition:   pending  Hypertension  Stable  Permissive hypertension (OK if < 220/120) but gradually normalize in 5-7 days  Long-term BP goal normotensive  Hyperlipidemia  Home meds:  pravastatin 40mg  PO daily, resumed in hospital  LDL 106, goal < 70  Continue statin at discharge  Diabetes  HgbA1c 6.0, goal < 7.0  Controlled  Other Stroke Risk Factors  Advanced age  Hx stroke/TIA  Other Active Problems  None  Hospital day # 2  I have personally examined this patient, reviewed notes, independently viewed imaging studies, participated in medical decision making and plan of care.ROS completed by me personally and pertinent positives fully documented  I have made any additions or clarifications directly to the above note. Patient has had multiple strokes with significant dementia at baseline and the quality of life is quite poor. I recommend serious consideration for palliative care and comfort care. Son plans to meet with his sister and palliative care team and make a final decision  soon. Discussed with Dr. Darnelle Catalan. Stroke team will sign off. Kindly call for questions.  Delia Heady, MD Medical Director [facility] Pager: [phone] 10/14/2016 1:00 PM   To contact Stroke Continuity provider, please refer to [email or web address]. After hours, contact General Neurology

## 2016-10-14 NOTE — Progress Notes (Signed)
RN attempted several times to get patient to take her oral medications via crushed in ice cream however the patient would turn her head away and say "no" or "dont want".

## 2016-10-15 DIAGNOSIS — I700 Atherosclerosis of aorta: Secondary | ICD-10-CM | POA: Diagnosis present

## 2016-10-15 DIAGNOSIS — L8990 Pressure ulcer of unspecified site, unspecified stage: Secondary | ICD-10-CM | POA: Insufficient documentation

## 2016-10-15 NOTE — Progress Notes (Signed)
Progress Note    [name]  PPI:[id number] DOB: 1923/04/21  DOA: 10/11/2016 PCP: Stroke, Md, MD    Brief Narrative:   Chief complaint: Follow-up stroke  Medical records reviewed and are as summarized below:  [name] is an 83 y.o. female the PMH of TIA/stroke, peripheral neuropathy, PVD, CAD status post MI, GERD, hyperlipidemia, diabetes, and stage IV chronic kidney disease who was admitted 10/11/16 for evaluation of the acute onset of confusion and aphasia. Workup positive for stroke.  Assessment/Plan:   Principal Problem:   Stroke (cerebrum) (HCC)/Dysarthria Workup positive for 4.6 cm region of confluent acute infarction of the left parietal lobe consistent with left MCA branch vessel infarction. EEG was abnormal with moderate diffuse slowing. LDL 106, on pravastatin. Continue aspirin. Palliative care recommended by stroke M.D. Family met with the palliative care team and at this time, all of her children wish for her to return home with hospice.  Active Problems:   CKD (chronic kidney disease), stage III Baseline creatinine appears to be around 2.6-2.8. Admission creatinine was 3.0, currently 1.68.    GERD (gastroesophageal reflux disease) Continue Pepcid.    COPD (chronic obstructive pulmonary disease) (HCC) Respiratory status stable. Continue bronchodilators as needed.    HLD (hyperlipidemia) Continue pravastatin.    Hypothyroid Continue Synthroid.    Hypertension Continue metoprolol and hydralazine as needed.   Skin necrosis to toes  Right foot with necrotic areas to 4th and 5th toes; 4th toe with dry eschar, approx 1X.8cm, 5th toe with dry eschar, .3X.3cm documented by WOC. Topical therapy not felt to be effective and recommendations are to leave dry, stable eschar intact.   Family Communication/Anticipated D/C date and plan/Code Status   DVT prophylaxis: Heparin ordered. Code Status: DO NOT RESUSCITATE  Family Communication: All 3 children  updated the bedside. Disposition Plan: Home with hospice planned for 10/16/16.   Medical Consultants:    Palliative Care  Neurology   Anti-Infectives:    None  Subjective:   Patient remains very minimally verbal, resting comfortably.  Objective:    Vitals:   10/14/16 1757 10/14/16 2111 10/15/16 0058 10/15/16 0548  BP: (!) 160/93 (!) 177/88 (!) 178/91 (!) 145/94  Pulse: 95 90 93 94  Resp: 19 20 20 20   Temp: 98.2 F (36.8 C) (!) 97.5 F (36.4 C) (!) 97.5 F (36.4 C) (!) 97.5 F (36.4 C)  TempSrc: Axillary Oral Axillary Axillary  SpO2: 100% 99% 99% 99%  Weight:    49.7 kg (109 lb 9.6 oz)    Intake/Output Summary (Last 24 hours) at 10/15/16 0826 Last data filed at 10/14/16 2210  Gross per 24 hour  Intake              202 ml  Output                0 ml  Net              202 ml   Filed Weights   10/12/16 0203 10/14/16 0500 10/15/16 0548  Weight: 47.1 kg (103 lb 12.8 oz) 47.9 kg (105 lb 8 oz) 49.7 kg (109 lb 9.6 oz)    Exam: Unchanged from 10/14/16 except as noted in red General exam: Less alert, minimally verbal. Respiratory system: Lungs diminished in the bases. No rales or rhonchi appreciated. Cardiovascular system: Heart sounds are regular. No murmurs, rubs, or gallops. Gastrointestinal system: Abdomen is soft, nontender, nondistended with normal active bowel sounds. Central nervous system: Less alert today.  Unable to assess orientation as patient is minimally verbal. No facial weakness appreciated. Generalized weakness in all extremities. Does not follow commands. Extremities: Left BKA. No edema right.  Onychomycosis to nails on right foot. Eschar to fourth and fifth toes. Skin: No rashes. Warm and dry. Psychiatry: Mood and affect flat.  Data Reviewed:   I have personally reviewed following labs and imaging studies:  Labs: Labs show the following: No new labs today.  Basic Metabolic Panel:  Recent Labs Lab 10/11/16 1031 10/12/16 0627  NA 140 143    K 3.8 3.8  CL 109 114*  CO2 21* 20*  GLUCOSE 138* 89  BUN 20 17  CREATININE 2.08* 1.68*  CALCIUM 8.9 8.8*  MG  --  1.7  PHOS  --  3.0   GFR Estimated Creatinine Clearance: 14.8 mL/min (A) (by C-G formula based on SCr of 1.68 mg/dL (H)). Liver Function Tests:  Recent Labs Lab 10/11/16 1031  AST 26  ALT 10*  ALKPHOS 71  BILITOT 0.7  PROT 5.9*  ALBUMIN 3.2*   CBC:  Recent Labs Lab 10/11/16 1031 10/12/16 0627  WBC 5.5 5.1  NEUTROABS 2.8  --   HGB 10.9* 10.0*  HCT 34.7* 32.0*  MCV 90.6 89.9  PLT 361 287   CBG:  Recent Labs Lab 10/13/16 0412 10/13/16 0755 10/13/16 1159 10/13/16 1603 10/13/16 2006  GLUCAP 95 93 106* 125* 121*    Procedures and diagnostic studies:  Ct Head Wo Contrast 10/11/2016 personally reviewed and is as pictured below with advanced atrophy.    Mr Brain Wo Contrast 10/12/2016: Personally reviewed and is as picture below with a large left parietal lobe/MCA territory infarct.     Dg Chest Port 1 View 10/11/2016 Personally reviewed and is as pictured, no significant findings.   Medications:   . aspirin  300 mg Rectal Daily   Or  . aspirin EC  325 mg Oral Daily  . calcium-vitamin D  1 tablet Oral Daily  . chlorhexidine  15 mL Mouth Rinse BID  . heparin  5,000 Units Subcutaneous Q8H  . isosorbide dinitrate  20 mg Oral TID  . levothyroxine  100 mcg Oral QAC breakfast  . mouth rinse  15 mL Mouth Rinse q12n4p  . mouth rinse  15 mL Mouth Rinse BID  . metoprolol tartrate  12.5 mg Oral BID  . potassium chloride  20 mEq Oral Daily  . pravastatin  40 mg Oral QPM  . sevelamer carbonate  0.8 g Oral TID WC  . sodium bicarbonate  650 mg Oral Daily   Continuous Infusions: . dextrose 5 % and 0.45% NaCl 50 mL/hr at 10/13/16 2045  . levETIRAcetam Stopped (10/14/16 2240)   Greater than 35 minutes spent with this patient and her family discussing prognosis, trajectory of treatment, summary of discussion with palliative care, and coordinating  care with the palliative care team. Level III visit.   Problems/DDx Points   Self limited or minor (max 2)       1   Established problem, stable       1  4+  Established problem, worsening       2   New problem, no additional W/U planned (max 1)       3   New problem, additional W/U planned        4    Data Reviewed Points   Review/order clinical lab tests       1  1  Review/order x-rays  1   Review/order tests (Echo, EKG, PFTs, etc)       1   Discussion of test results w/ performing MD       1   Independent review of image, tracing or specimen       2  2  Decision to obtain old records       1   Review and summation of old records       2    Level of risk Presenting prob Diagnostics Management   Minimal 1 self limited/minor Labs CXR EKG/EEG U/A U/S Rest Gargles Bandages Dressings   Low 2 or more self limited/minor 1 stable chronic Acute uncomplicated illness Tests (PFTS) Non-CV imaging Arterial labs Biopsies of skin OTC drugs Minor surgery-no risk PT OT IVF without additives    Moderate 1 or more chronic illnesses w/ mild exac, progression or S/E from tx 2 or more stable chronic illnesses Undiagnosed new problem w/ uncertain prognosis Acute complicated injury  Stress tests Endoscopies with no risk factors Deep needle or incisional bx CV imaging without risk LP Thoracentesis Paracentesis Minor surgery w/ risks Elective major surgery w/ no risk (open, percutaneous or endoscopic) Prescription drugs Therapeutic nucl med IVF with additives Closed tx of fracture/dislocation    High Severe exac of chronic illness Acute or chronic illness/injury may pose a threat to life or bodily function (ARF) Change in neuro status    CV imaging w/ contrast and risk Cardio electophysiologic tests Endoscopies w/ risk Discography Elective major surgery Emergency major surgery Parenteral controlled substances Drug therapy req monitoring for toxicity DNR/de-escalation  of care    MDM Prob points Data points Risk   Straightforward    <1    <1    Min   Low complexity    2    2    Low   Moderate    3    3    Mod   High Complexity    4 or more    4 or more    High      LOS: 3 days   Amarise Lillo  [facility] Pager [phone]. If unable to reach me by pager, please call my cell phone at [phone].  *Please refer to [email or web address], password [email or web address] to get updated schedule on who will round on this patient, as hospitalists switch teams weekly. If 7PM-7AM, please contact night-coverage at [email or web address], password [email or web address] for any overnight needs.  10/15/2016, 8:26 AM

## 2016-10-15 NOTE — Progress Notes (Signed)
Daily Progress Note   Patient Name: [name]       Date: 10/15/2016 DOB: 09/16/1923  Age: 83 y.o. MRN#: [id number] Attending Physician: Tonye Royalty, MD Primary Care Physician: Stroke, Md, MD Admit Date: 10/11/2016  Reason for Consultation/Follow-up: Establishing goals of care, Hospice Evaluation and Psychosocial/spiritual support  Subjective: Patient is more lethargic today. Remains nonverbal. Only taking sips of liquid. Has been too lethargic to take any of her oral medicines  Length of Stay: 3  Current Medications: Scheduled Meds:  . aspirin  300 mg Rectal Daily   Or  . aspirin EC  325 mg Oral Daily  . calcium-vitamin D  1 tablet Oral Daily  . chlorhexidine  15 mL Mouth Rinse BID  . heparin  5,000 Units Subcutaneous Q8H  . isosorbide dinitrate  20 mg Oral TID  . levothyroxine  100 mcg Oral QAC breakfast  . mouth rinse  15 mL Mouth Rinse q12n4p  . mouth rinse  15 mL Mouth Rinse BID  . metoprolol tartrate  12.5 mg Oral BID  . potassium chloride  20 mEq Oral Daily  . pravastatin  40 mg Oral QPM  . sevelamer carbonate  0.8 g Oral TID WC  . sodium bicarbonate  650 mg Oral Daily    Continuous Infusions: . dextrose 5 % and 0.45% NaCl 50 mL/hr at 10/15/16 1132  . levETIRAcetam 500 mg (10/15/16 1037)    PRN Meds: famotidine, hydrALAZINE, ipratropium-albuterol  Physical Exam  Constitutional:  Cachectic, frail, acutely ill appearing elderly female  Cardiovascular: Normal rate.   Pulmonary/Chest: Effort normal.  Neurological:  Lethargic  Skin: Skin is warm and dry.  Psychiatric:  Unable to test  Nursing note and vitals reviewed.           Vital Signs: BP (!) 157/85 (BP Location: Left Arm)   Pulse 100   Temp (!) 97.4 F (36.3 C) (Axillary)   Resp 18   Wt  49.7 kg (109 lb 9.6 oz)   SpO2 100%   BMI 23.72 kg/m  SpO2: SpO2: 100 % O2 Device: O2 Device: Not Delivered O2 Flow Rate:    Intake/output summary:  Intake/Output Summary (Last 24 hours) at 10/15/16 1306 Last data filed at 10/14/16 2210  Gross per 24 hour  Intake  202 ml  Output                0 ml  Net              202 ml   LBM: Last BM Date: 10/13/16 Baseline Weight: Weight: 50.8 kg (112 lb) Most recent weight: Weight: 49.7 kg (109 lb 9.6 oz)       Palliative Assessment/Data:    Flowsheet Rows     Most Recent Value  Intake Tab  Referral Department  Hospitalist  Unit at Time of Referral  Other (Comment)  Palliative Care Primary Diagnosis  Neurology  Date Notified  10/13/16  Palliative Care Type  Return patient Palliative Care  Reason for referral  Clarify Goals of Care, Counsel Regarding Hospice  Date of Admission  10/11/16  Date first seen by Palliative Care  10/14/16  # of days Palliative referral response time  1 Day(s)  # of days IP prior to Palliative referral  2  Clinical Assessment  Palliative Performance Scale Score  20%  Pain Max last 24 hours  Not able to report  Pain Min Last 24 hours  Not able to report  Dyspnea Max Last 24 Hours  Not able to report  Dyspnea Min Last 24 hours  Not able to report  Nausea Max Last 24 Hours  Not able to report  Nausea Min Last 24 Hours  Not able to report  Anxiety Max Last 24 Hours  Not able to report  Anxiety Min Last 24 Hours  Not able to report  Other Max Last 24 Hours  Not able to report  Psychosocial & Spiritual Assessment  Palliative Care Outcomes  Patient/Family meeting held?  Yes  Who was at the meeting?  daughter [facility]  Palliative Care follow-up planned  Yes, Facility      Patient Active Problem List   Diagnosis Date Noted  . Skin necrosis ([place]) 10/13/2016  . Stroke (cerebrum) ([place]) 10/11/2016  . Stroke-like symptoms 10/11/2016  . CKD (chronic kidney disease), stage III 10/11/2016  . GERD  (gastroesophageal reflux disease) 10/11/2016  . COPD (chronic obstructive pulmonary disease) ([place]) 10/11/2016  . HLD (hyperlipidemia) 10/11/2016  . Hypothyroid 10/11/2016  . Hypertension   . Dysarthria   . Acute CVA (cerebrovascular accident) ([place]) 08/13/2016  . Slurred speech 08/12/2016  . Acute gout due to renal impairment involving right shoulder 06/07/2016  . S/P AKA (above knee amputation) unilateral, left ([place])   . CHF (congestive heart failure) ([place]) 04/18/2016  . Respiratory distress   . Acute on chronic combined systolic and diastolic CHF (congestive heart failure) ([place]) 01/16/2016  . Palliative care encounter   . Altered mental status   . Goals of care, counseling/discussion   . Palliative care by specialist   . Aphasia   . Hemi-neglect of right side   . Cerebral thrombosis with cerebral infarction 12/30/2015  . Cerebrovascular accident (CVA) involving left middle cerebral artery [facility])   . Altered mental state 12/29/2015  . Acute right-sided weakness 12/29/2015  . Dysphagia   . FTT (failure to thrive) in adult 11/08/2015  . Nausea & vomiting 11/08/2015  . Metabolic acidosis 54/62/7035  . End stage renal disease ([place]) 10/07/2015  . Hypertensive heart disease with heart failure ([place]) 10/07/2015  . Junctional bradycardia 09/18/2015  . Atypical chest pain 06/23/2014  . Neck pain   . Food impaction of esophagus   . Epiglottitis 04/13/2014  . Wound disruption, post-op, skin 02/05/2014  . Pre-operative cardiovascular examination 12/13/2013  .  Foot pain 09/04/2013  . CKD (chronic kidney disease) stage 4, GFR 15-29 ml/min (HCC) 09/04/2013  . Essential hypertension 08/23/2013  . Malnutrition of moderate degree ([place]) 08/21/2013  . Chronic combined systolic (congestive) and diastolic (congestive) heart failure ([place]) 08/15/2013  . Anemia of chronic disease 08/15/2013  . Hyperkalemia 08/15/2013  . Chronic total occlusion of artery of the extremities ([place]) 02/27/2013    . Dyslipidemia 01/05/2013  . Chronic renal insufficiency, stage IV (severe)- HD started 01/05/13 01/05/2013  . Diabetes mellitus type 2 with peripheral artery disease ([place]) 01/05/2013  . PVD (peripheral vascular disease) ([place]) 10/19/2011  . CAD - moderate at cath July 2012 (medical Rx) 02/08/2011  . 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 Severe hypothyroidism 02/08/2011    Palliative Care Assessment & Plan   Patient Profile: 83 y.o. female  with past medical history of CVA in May 2018 as well as prior to this, cardiomyopathy, chronic diastolic heart failure with an EF of 45-50%, chronic kidney disease stage IV (creatinine 1.68) ,diabetes, dyslipidemia, GERD, anemia, left BKA, end-stage vascular dementia admitted on 10/11/2016 with altered mental status. Per MRI patient has new 4.6 cm region of confluent acute infarction in the left parietal lobe consistent with left MCA branch vessel infarction. This is adjacent to regions of known infarction in May 2018. Old bilateral parietal infarctions are noted. Patient has extensive chronic small vessel ischemic changes throughout the brain as well as extensive atrophy..   Patient at baseline is bed bound and cared for by her son in the home. She has what family describes "good and bad days", where she is more alert and able to eat more. Patient has been seen by palliative medicine service going back to 2014 and most recently during her hospitalization for CVA in May 2018. At that time family was hopeful that patient would meet residential hospice criteria but she rebounded and became more alert and increased her oral intake; subsequently went back home with her son  Assessment: Met with patient's son, [name], and daughters [name], and [name] for goals of care discussion. Family was very emotional and tearful at times, but came together to formulate a plan for their mother to go home with hospice care in the home. They recognize  their mother has had a long illness secondary to multiple strokes and that she is now nearing end-of-life. Comfort and dignity other primary goals at this point  Recommendations/Plan:  When medically stabilized, discharged home with hospice in the home. Order placed to care management   Goals of Care and Additional Recommendations:  Limitations on Scope of Treatment: Full Comfort Care  Code Status:    Code Status Orders        Start     Ordered   10/11/16 1701  Do not attempt resuscitation (DNR)  Continuous    Question Answer Comment  In the event of cardiac or respiratory ARREST Do not call a "code blue"   In the event of cardiac or respiratory ARREST Do not perform Intubation, CPR, defibrillation or ACLS   In the event of cardiac or respiratory ARREST Use medication by any route, position, wound care, and other measures to relive pain and suffering. May use oxygen, suction and manual treatment of airway obstruction as needed for comfort.      10/11/16 1700    Code Status History    Date Active Date Inactive Code Status Order ID Comments User Context   08/13/2016  2:17 PM 08/16/2016  7:33  PM DNR [id number]  Murlean Iba, MD Inpatient   08/12/2016  6:10 PM 08/13/2016  2:17 PM DNR [id number]  Elwin Mocha, MD Inpatient   08/12/2016  2:11 PM 08/12/2016  6:10 PM Full Code [id number]  Elwin Mocha, MD ED   04/20/2016 11:16 AM 04/23/2016  1:50 PM DNR [id number]  Jannette Fogo, NP Inpatient   04/18/2016  6:41 PM 04/20/2016 11:16 AM Full Code [id number]  Rosita Fire, MD ED   01/20/2016  8:27 AM 01/20/2016  9:01 PM DNR [id number]  Wallis Bamberg, MD Inpatient   01/16/2016  2:17 PM 01/20/2016  8:27 AM Full Code [id number]  Elgergawy, Silver Huguenin, MD Inpatient   01/16/2016  1:31 PM 01/16/2016  2:17 PM DNR [id number]  Albertine Patricia, MD ED   12/29/2015  1:45 AM 01/06/2016  3:16 AM Full Code [id number]  Lily Kocher, MD Inpatient   11/08/2015  8:11 PM 11/13/2015  4:39 PM  Full Code [id number]  Samella Parr, NP Inpatient   06/22/2014  6:09 PM 06/23/2014  5:51 PM Full Code [id number]  Verlee Monte, MD Inpatient   04/13/2014  4:52 PM 04/17/2014  8:25 PM Full Code [id number]  Albin Felling, MD Inpatient   03/17/2014  2:24 PM 03/17/2014  6:50 PM Full Code [id number]  Conrad [place], MD Inpatient   08/15/2013  6:49 PM 08/23/2013  7:15 PM Full Code [id number]  Robbie Lis, MD Inpatient   06/15/2013  7:14 PM 06/17/2013  9:33 PM Full Code [id number]  Shanda Howells, MD ED   01/10/2013  5:20 PM 01/16/2013  8:43 PM DNR [id number]  Acquanetta Chain, DO Inpatient   01/05/2013  1:20 AM 01/10/2013  5:20 PM Full Code [id number]  Theressa Millard, MD ED   07/05/2011 12:56 PM 07/12/2011  6:13 PM Full Code [id number]  Renee Pain, RN Inpatient   02/08/2011  2:10 AM 02/09/2011  6:43 PM Partial Code [id number]  Freddy Jaksch, RN Inpatient    Advance Directive Documentation     Most Recent Value  Type of Advance Directive  Out of facility DNR (pink MOST or yellow form)  Pre-existing out of facility DNR order (yellow form or pink MOST form)  Yellow form placed in chart (order not valid for inpatient use)  "MOST" Form in Place?  -       Prognosis:   < 2 weeks in the setting of multiple embolic strokes, now nonverbal, no longer taking anything by mouth;  Discharge Planning:  Home with Hospice  Care plan was discussed with bedside RN  Thank you for allowing the Palliative Medicine Team to assist in the care of this patient.   Time In: 1200 Time Out: 1245 Total Time 45 min Prolonged Time Billed  no       Greater than 50%  of this time was spent counseling and coordinating care related to the above assessment and plan.  Dory Horn, NP  Please contact Palliative Medicine Team phone at [phone] for questions and concerns.

## 2016-10-16 NOTE — Discharge Summary (Signed)
Physician Discharge Summary  [name] RCV:[id number] DOB: 1923/04/27 DOA: 10/11/2016  PCP: Stroke, Md, MD  Admit date: 10/11/2016 Discharge date: 10/16/2016  Admitted From: Home Discharge disposition: Home   Recommendations for Outpatient Follow-Up:   1. Patient is being discharged home with home hospice care.   Discharge Diagnosis:   Principal Problem:   Stroke (cerebrum) (HCC) Active Problems:   Stroke-like symptoms   CKD (chronic kidney disease), stage III   GERD (gastroesophageal reflux disease)   COPD (chronic obstructive pulmonary disease) (HCC)   HLD (hyperlipidemia)   Hypothyroid   Hypertension   Dysarthria   Skin necrosis ([place])   Aortic atherosclerosis ([place])  Discharge Condition: Guarded.  Diet recommendation: Comfort feeds.   History of Present Illness:   [name] is an 83 y.o. female the PMH of TIA/stroke, peripheral neuropathy, PVD, CAD status post MI, GERD, hyperlipidemia, diabetes, and stage IV chronic kidney disease who was admitted 10/11/16 for evaluation of the acute onset of confusion and aphasia. Workup positive for stroke.  Hospital Course by Problem:   Principal Problem:   Stroke (cerebrum) (HCC)/Dysarthria Workup positive for 4.6 cm region of confluent acute infarction of the left parietal lobe consistent with left MCA branch vessel infarction. EEG was abnormal with moderate diffuse slowing. LDL 106, on pravastatin. Palliative care recommended by stroke M.D. Family met with the palliative care team and at this time, all of her children wish for her to return home with hospice.Can continue to take aspirin as able to tolerate oral intake.  Active Problems:   CKD (chronic kidney disease), stage III Baseline creatinine appears to be around 2.6-2.8. Admission creatinine was 3.0, currently 1.68.    GERD (gastroesophageal reflux disease) Continue Pepcid as needed.    COPD (chronic obstructive pulmonary disease) (HCC) Respiratory  status stable. Continue bronchodilators as needed.    HLD (hyperlipidemia) Okay to discontinue pravastatin in light of end-of-life care.    Hypothyroid Okay to discontinue Synthroid in light of end-of-life care.    Hypertension Okay to discontinue metoprolol in light of end of life care.   Skin necrosis to toes Right foot with necrotic areas to 4th and 5th toes; 4th toe with dry eschar, approx 1X.8cm, 5th toe with dry eschar, .3X.3cm documented by WOC. Topical therapy not felt to be effective and recommendations are to leave dry, stable eschar intact.   Medical Consultants:     Palliative Care  Neurology   Discharge Exam:   Vitals:   10/16/16 0852 10/16/16 1358  BP: (!) 147/82 (!) 164/83  Pulse: (!) 104 (!) 103  Resp: 20 20  Temp: 98.6 F (37 C) 98.4 F (36.9 C)   Vitals:   10/16/16 0200 10/16/16 0529 10/16/16 0852 10/16/16 1358  BP: (!) 128/49 (!) 154/68 (!) 147/82 (!) 164/83  Pulse: (!) 105 (!) 106 (!) 104 (!) 103  Resp: 18 18 20 20   Temp: 97.7 F (36.5 C) 98 F (36.7 C) 98.6 F (37 C) 98.4 F (36.9 C)  TempSrc: Oral Axillary Oral Axillary  SpO2: 100% 100% 95% 100%  Weight:        General exam: Appears calm and comfortable. Resting quietly. Respiratory system: Clear to auscultation. Respiratory effort normal. Cardiovascular system: S1 & S2 heard, RRR. No JVD,  rubs, gallops or clicks. No murmurs. Gastrointestinal system: Abdomen is nondistended, soft and nontender. No organomegaly or masses felt. Normal bowel sounds heard. Central nervous system: Lethargic today, not following commands.  Extremities: Left BKA. Onychomycosis to the nails on  the right foot and eschar to the fourth and fifth toes. Skin: Warm and dry, toes as noted above. Psychiatry: Flat affect.    The results of significant diagnostics from this hospitalization (including imaging, microbiology, ancillary and laboratory) are listed below for reference.     Procedures and Diagnostic  Studies:   Ct Head Wo Contrast  Result Date: 10/11/2016 CLINICAL DATA:  Aphasia. EXAM: CT HEAD WITHOUT CONTRAST TECHNIQUE: Contiguous axial images were obtained from the base of the skull through the vertex without intravenous contrast. COMPARISON:  MRI and CT 08/12/2016 FINDINGS: Brain: severe chronic small vessel disease throughout the deep white matter and diffuse cerebral atrophy. Low-density in the posterior parietal lobes bilaterally compatible with chronic infarct. Small old lacunar infarct in the left thalamus no acute infarction or hemorrhage. Ventriculomegaly is stable related to ex vacuo dilatation. Vascular: No hyperdense vessel or unexpected calcification. Skull: No acute calvarial abnormality. Sinuses/Orbits: Visualized paranasal sinuses and mastoids clear. Orbital soft tissues unremarkable. Other: None IMPRESSION: Advanced atrophy and chronic small vessel disease changes. Ex vacuo dilatation of the ventricles. Chronic bilateral parietal infarcts and left thalamic lacunar infarct. No acute intracranial abnormality. Electronically Signed   By: Rolm Baptise M.D.   On: 10/11/2016 11:07   Mr Brain Wo Contrast  Result Date: 10/12/2016 CLINICAL DATA:  Worsened mental status. EXAM: MRI HEAD WITHOUT CONTRAST TECHNIQUE: Multiplanar, multiecho pulse sequences of the brain and surrounding structures were obtained without intravenous contrast. COMPARISON:  CT 10/11/2016.  MRI 08/12/2016. FINDINGS: Brain: The study suffers from motion degradation. There is a new confluent region of acute infarction in the left parietal lobe measuring approximately 4.6 cm in size consistent with left MCA branch vessel infarction. This is adjacent to the region of infarction seen in May. No other acute infarction. There are a few old small vessel cerebellar infarctions. There are old small vessel infarctions affecting the thalami I and basal ganglia. There chronic small-vessel ischemic changes throughout the cerebral  hemispheric white matter. There are old right and left parietal infarctions. The ventricles are chronically enlarged due to central atrophy. No extra-axial collection. No sign of mass lesion. Vascular: Major vessels at the base of the brain show flow. Skull and upper cervical spine: Negative Sinuses/Orbits: Clear/normal Other: None IMPRESSION: 4.6 cm region of confluent acute infarction in the left parietal lobe consistent with left MCA branch vessel infarction. This is adjacent to regions of infarction there were acute in May of this year. Old bilateral parietal infarctions. Extensive chronic small-vessel ischemic changes throughout the brain. Brain atrophy and ex vacuo enlargement of the lateral ventricles. Electronically Signed   By: [name] Chimes M.D.   On: 10/12/2016 10:11   Dg Chest Port 1 View  Result Date: 10/11/2016 CLINICAL DATA:  Altered mental status. EXAM: PORTABLE CHEST 1 VIEW COMPARISON:  Chest x-ray dated Aug 12, 2016. FINDINGS: The cardiomediastinal silhouette is mildly enlarged, unchanged. Atherosclerotic calcification of the aortic arch. Normal pulmonary vascularity. Mild bibasilar atelectasis. No focal consolidation, pleural effusion, or pneumothorax. Osteopenia. No acute osseous abnormality. IMPRESSION: 1.  No active cardiopulmonary disease. 2.  Aortic Atherosclerosis (ICD10-I70.0). Electronically Signed   By: Titus Dubin M.D.   On: 10/11/2016 11:18     Labs:   Basic Metabolic Panel:  Recent Labs Lab 10/11/16 1031 10/12/16 0627  NA 140 143  K 3.8 3.8  CL 109 114*  CO2 21* 20*  GLUCOSE 138* 89  BUN 20 17  CREATININE 2.08* 1.68*  CALCIUM 8.9 8.8*  MG  --  1.7  PHOS  --  3.0   GFR Estimated Creatinine Clearance: 14.8 mL/min (A) (by C-G formula based on SCr of 1.68 mg/dL (H)). Liver Function Tests:  Recent Labs Lab 10/11/16 1031  AST 26  ALT 10*  ALKPHOS 71  BILITOT 0.7  PROT 5.9*  ALBUMIN 3.2*   CBC:  Recent Labs Lab 10/11/16 1031 10/12/16 0627  WBC  5.5 5.1  NEUTROABS 2.8  --   HGB 10.9* 10.0*  HCT 34.7* 32.0*  MCV 90.6 89.9  PLT 361 287   CBG:  Recent Labs Lab 10/13/16 0412 10/13/16 0755 10/13/16 1159 10/13/16 1603 10/13/16 2006  GLUCAP 95 93 106* 125* 121*    Discharge Instructions:   Discharge Instructions    Call MD for:  persistant nausea and vomiting    Complete by:  As directed    Call MD for:  severe uncontrolled pain    Complete by:  As directed    Diet general    Complete by:  As directed    Sips and bites of food as tolerated.   Increase activity slowly    Complete by:  As directed      Allergies as of 10/16/2016      Reactions   Aspirin Nausea And Vomiting, Other (See Comments)   325 mg (adult strength) [name]       Medication List    STOP taking these medications   calcium-vitamin D 500-200 MG-UNIT tablet   colchicine 0.6 MG tablet   docusate sodium 100 MG capsule Commonly known as:  COLACE   feeding supplement Liqd   furosemide 40 MG tablet Commonly known as:  LASIX   hydrALAZINE 50 MG tablet Commonly known as:  APRESOLINE   metoprolol tartrate 25 MG tablet Commonly known as:  LOPRESSOR   potassium chloride 20 MEQ/15ML (10%) Soln   pravastatin 40 MG tablet Commonly known as:  PRAVACHOL   sevelamer carbonate 0.8 g Pack packet Commonly known as:  RENVELA     TAKE these medications   acetaminophen 325 MG tablet Commonly known as:  TYLENOL Take 650 mg by mouth every 6 (six) hours as needed for mild pain. What changed:  Another medication with the same name was removed. Continue taking this medication, and follow the directions you see here.   albuterol-ipratropium 18-103 MCG/ACT inhaler Commonly known as:  COMBIVENT Inhale 1-2 puffs into the lungs at bedtime as needed for wheezing or shortness of breath.   aspirin EC 81 MG tablet Take 81 mg by mouth daily. What changed:  Another medication with the same name  was removed. Continue taking this medication, and follow the directions you see here.   famotidine 20 MG tablet Commonly known as:  PEPCID Take 1 tablet (20 mg total) by mouth at bedtime. What changed:  when to take this  reasons to take this   isosorbide dinitrate 20 MG tablet Commonly known as:  ISORDIL Take 1 tablet (20 mg total) by mouth 3 (three) times daily.   levothyroxine 100 MCG tablet Commonly known as:  SYNTHROID, LEVOTHROID Take 1 tablet (100 mcg total) by mouth daily.   sodium bicarbonate 650 MG tablet Take 1 tablet (650 mg total) by mouth daily.         Time coordinating discharge: 35 minutes.  Signed:  Mustaf Antonacci  Pager [phone] [facility] 10/16/2016, 2:06 PM

## 2016-10-16 NOTE — Care Management Note (Signed)
Case Management Note Donn Pierini RN, BSN Unit 4E-Case Manager-- 46M coverage [phone]  Patient Details  Name: TERRIAH ZIELSDORF MRN: [id number] Date of Birth: [birth year]  Subjective/Objective:  Pt admitted with acute CVA                  Action/Plan: PTA pt lived at home with family- was active with [facility] for [facility] services- referral received for Home Hospice choice- spoke with pt's daughters at the bedside- confirmed that they wanted home with hospice services and not HH with [facility]- [facility] offered for [facility] H[facility] agency [facility]- per daughters they would like to use [facility]- discussed DME needs- they state that pt has all needed DME at this time- plan is to return home via non emergent ambulance- CM will call [facility] once d/c arrangements have been completed- Hospice referral called to [place] with [facility]- she will come meet with family within the hour and then CM will arrange transport home via [facility].   Expected Discharge Date:  10/16/16               Expected Discharge Plan:  Home w Hospice Care  In-House Referral:     Discharge planning Services  CM Consult  Post Acute Care Choice:  Hospice Choice offered to:  Adult Children  DME Arranged:  N/A DME Agency:  NA  HH Arranged:  RN, Disease Management HH Agency:  [facility]  Status of Service:  Completed, signed off  If discussed at Long Length of Stay Meetings, dates discussed:    Discharge Disposition: home with home hospice   Additional Comments:  Darrold Span, RN 10/16/2016, 3:03 PM

## 2016-10-16 NOTE — Progress Notes (Addendum)
1600--[facility]--RN Visit  Notified by Donn Pierini, CMRN at 3 pm of family request for [name] services at home after discharge. Chart and patient information under review by [name] physician. Hospice eligibility pending at this time.  Spoke with patients daughters Meriam Sprague and [place] at bedside and Mathis Fare, son via cell phone speaker to initiate education related to hospice philosophy, services and team approach to care. Family verbalized understanding of information given. Per discussion, plan is for discharge to home by [facility] tonight if DME is delivered early enough this evening or tomorrow if DME is delivered too late. Family will notify RN when equipment delivered and if they wish for patient to be transferred this evening.  DME needs have been discussed, patient currently has a hospital bed with trapeze, BSC, shower chair and WC in the home. The family requests for a hospice hospital bed and over the bed table for delivery to the home. [facility] has been notified and will contact the family to arrange for delivery to the home. Home address has been verified and is correct in the chart. Mathis Fare is the family member to contact to arrange time of delivery.  Please send signed and completed DNR form home with patient/family. Patient will need prescriptions for discharge comfort medications.  [facility] Referral [name] aware of the above. Completed discharge summary has been faxed by the hospital liaison.   [facility] information and contact numbers given to [place] during this visit.   Please notify [facility] when patient is ready to leave the unit at discharge. Call [phone] if patient discharges tomorrow or [phone] if patient discharges this evening.  [facility] will need to be called by the bedside RN if discharge is tonight at [phone] per [place], RNCM. If discharge is tomorrow please notify the RNCM to arrange for transport.  Above information  shared with Donn Pierini, RNCM.  Please call with any hospice related questions or concerns.  Thank you, Haynes Bast, RN [name] Liaison [phone]

## 2016-10-17 LAB — VAS US CAROTID
LEFT ECA DIAS: 7 cm/s
LICADSYS: 70 cm/s
LICAPDIAS: -8 cm/s
Left CCA dist dias: 9 cm/s
Left CCA dist sys: 48 cm/s
Left CCA prox dias: 7 cm/s
Left CCA prox sys: 39 cm/s
Left ICA dist dias: 22 cm/s
Left ICA prox sys: -27 cm/s
RIGHT ECA DIAS: -16 cm/s
Right CCA prox dias: 12 cm/s
Right CCA prox sys: 73 cm/s
Right cca dist sys: -65 cm/s

## 2016-10-17 NOTE — Progress Notes (Signed)
[facility]  Eligibility confirmed and [facility] RN scheduled to see patient at home this evening.   Please send completed and signed out of facility DNR with patient. Please send scripts for any medication patient does not already have including comfort medications.  Please fax discharge summary to [phone].  Thank you,  Forrestine Him, LCSW [phone]

## 2016-10-17 NOTE — Care Management Note (Signed)
Case Management Note  Patient Details  Name: [name] MRN: [id number] Date of Birth: [birth year]  Subjective/Objective:                    Action/Plan: CM spoke to Mr Loleta Chance, patients son, this am and he states the equipment is at the home and set up. He asked that patient transport home via [facility].  [facility] informed that patient would be d/cing home this am. CM also faxed them the d/c summary per request.  At 1050 CM called [facility] and arranged transport home. Mr Loleta Chance updated on timing of [facility].  Transport and DNR forms on the front of the chart and bedside RN updated.   Expected Discharge Date:  10/16/16               Expected Discharge Plan:  Home w Hospice Care  In-House Referral:     Discharge planning Services  CM Consult  Post Acute Care Choice:  Hospice Choice offered to:  Adult Children  DME Arranged:  N/A DME Agency:  NA  HH Arranged:  RN, Disease Management HH Agency:  [facility]  Status of Service:  Completed, signed off  If discussed at Long Length of Stay Meetings, dates discussed:    Additional Comments:  Kermit Balo, RN 10/17/2016, 1:18 PM

## 2016-10-17 NOTE — Progress Notes (Signed)
[facility] arrived to transport patient. 

## 2016-10-31 ENCOUNTER — Inpatient Hospital Stay (HOSPITAL_COMMUNITY): Admission: RE | Admit: 2016-10-31 | Payer: Medicare Other | Source: Ambulatory Visit

## 2016-11-19 DEATH — deceased
